# Patient Record
Sex: Male | Born: 1935
Health system: Southern US, Community
[De-identification: ages and names within clinical notes are randomized; demographics above are authoritative.]

## PROBLEM LIST (undated history)

## (undated) DIAGNOSIS — I350 Nonrheumatic aortic (valve) stenosis: Secondary | ICD-10-CM

## (undated) DIAGNOSIS — J189 Pneumonia, unspecified organism: Secondary | ICD-10-CM

## (undated) DIAGNOSIS — I482 Chronic atrial fibrillation, unspecified: Secondary | ICD-10-CM

## (undated) DIAGNOSIS — Z8619 Personal history of other infectious and parasitic diseases: Secondary | ICD-10-CM

## (undated) DIAGNOSIS — I252 Old myocardial infarction: Secondary | ICD-10-CM

## (undated) DIAGNOSIS — R011 Cardiac murmur, unspecified: Secondary | ICD-10-CM

## (undated) DIAGNOSIS — K759 Inflammatory liver disease, unspecified: Secondary | ICD-10-CM

## (undated) DIAGNOSIS — I050 Rheumatic mitral stenosis: Secondary | ICD-10-CM

## (undated) DIAGNOSIS — Z7901 Long term (current) use of anticoagulants: Secondary | ICD-10-CM

## (undated) DIAGNOSIS — M1990 Unspecified osteoarthritis, unspecified site: Secondary | ICD-10-CM

## (undated) DIAGNOSIS — I340 Nonrheumatic mitral (valve) insufficiency: Secondary | ICD-10-CM

## (undated) DIAGNOSIS — I2510 Atherosclerotic heart disease of native coronary artery without angina pectoris: Secondary | ICD-10-CM

## (undated) DIAGNOSIS — N400 Enlarged prostate without lower urinary tract symptoms: Secondary | ICD-10-CM

## (undated) DIAGNOSIS — F419 Anxiety disorder, unspecified: Secondary | ICD-10-CM

## (undated) DIAGNOSIS — I639 Cerebral infarction, unspecified: Secondary | ICD-10-CM

## (undated) DIAGNOSIS — I10 Essential (primary) hypertension: Secondary | ICD-10-CM

## (undated) HISTORY — DX: Nonrheumatic mitral (valve) insufficiency: I34.0

## (undated) HISTORY — DX: Chronic atrial fibrillation, unspecified: I48.20

## (undated) HISTORY — DX: Benign prostatic hyperplasia without lower urinary tract symptoms: N40.0

## (undated) HISTORY — DX: Cerebral infarction, unspecified: I63.9

## (undated) HISTORY — DX: Long term (current) use of anticoagulants: Z79.01

## (undated) HISTORY — DX: Essential (primary) hypertension: I10

## (undated) HISTORY — PX: PROSTATE BIOPSY: SHX241

## (undated) HISTORY — DX: Atherosclerotic heart disease of native coronary artery without angina pectoris: I25.10

## (undated) HISTORY — DX: Old myocardial infarction: I25.2

## (undated) HISTORY — PX: CATARACT EXTRACTION W/ INTRAOCULAR LENS IMPLANT: SHX1309

## (undated) HISTORY — DX: Personal history of other infectious and parasitic diseases: Z86.19

## (undated) HISTORY — DX: Unspecified osteoarthritis, unspecified site: M19.90

---

## 2000-10-30 ENCOUNTER — Encounter: Admission: RE | Admit: 2000-10-30 | Discharge: 2000-10-30 | Payer: Self-pay

## 2005-06-19 DIAGNOSIS — I252 Old myocardial infarction: Secondary | ICD-10-CM

## 2005-06-19 HISTORY — DX: Old myocardial infarction: I25.2

## 2005-06-19 HISTORY — PX: CORONARY ANGIOPLASTY: SHX604

## 2005-07-23 ENCOUNTER — Inpatient Hospital Stay (HOSPITAL_COMMUNITY): Admission: EM | Admit: 2005-07-23 | Discharge: 2005-07-31 | Payer: Self-pay | Admitting: Emergency Medicine

## 2005-07-25 ENCOUNTER — Encounter: Payer: Self-pay | Admitting: Cardiology

## 2005-08-17 ENCOUNTER — Encounter (HOSPITAL_COMMUNITY): Admission: RE | Admit: 2005-08-17 | Discharge: 2005-09-11 | Payer: Self-pay | Admitting: Cardiology

## 2005-09-03 ENCOUNTER — Inpatient Hospital Stay (HOSPITAL_COMMUNITY): Admission: EM | Admit: 2005-09-03 | Discharge: 2005-09-07 | Payer: Self-pay | Admitting: Emergency Medicine

## 2005-09-04 ENCOUNTER — Encounter: Payer: Self-pay | Admitting: Cardiology

## 2005-09-07 ENCOUNTER — Ambulatory Visit: Payer: Self-pay | Admitting: Physical Medicine & Rehabilitation

## 2005-09-07 ENCOUNTER — Inpatient Hospital Stay (HOSPITAL_COMMUNITY)
Admission: RE | Admit: 2005-09-07 | Discharge: 2005-09-22 | Payer: Self-pay | Admitting: Physical Medicine & Rehabilitation

## 2005-10-30 ENCOUNTER — Encounter
Admission: RE | Admit: 2005-10-30 | Discharge: 2006-01-28 | Payer: Self-pay | Admitting: Physical Medicine & Rehabilitation

## 2005-10-30 ENCOUNTER — Ambulatory Visit: Payer: Self-pay | Admitting: Physical Medicine & Rehabilitation

## 2005-11-02 ENCOUNTER — Ambulatory Visit (HOSPITAL_COMMUNITY)
Admission: RE | Admit: 2005-11-02 | Discharge: 2005-11-02 | Payer: Self-pay | Admitting: Physical Medicine & Rehabilitation

## 2005-11-08 ENCOUNTER — Encounter
Admission: RE | Admit: 2005-11-08 | Discharge: 2005-12-06 | Payer: Self-pay | Admitting: Physical Medicine & Rehabilitation

## 2005-12-07 ENCOUNTER — Encounter
Admission: RE | Admit: 2005-12-07 | Discharge: 2006-01-02 | Payer: Self-pay | Admitting: Physical Medicine & Rehabilitation

## 2005-12-26 ENCOUNTER — Ambulatory Visit: Payer: Self-pay | Admitting: Physical Medicine & Rehabilitation

## 2006-01-03 ENCOUNTER — Encounter
Admission: RE | Admit: 2006-01-03 | Discharge: 2006-02-02 | Payer: Self-pay | Admitting: Physical Medicine & Rehabilitation

## 2007-12-05 ENCOUNTER — Emergency Department (HOSPITAL_COMMUNITY): Admission: EM | Admit: 2007-12-05 | Discharge: 2007-12-06 | Payer: Self-pay | Admitting: Emergency Medicine

## 2008-03-09 ENCOUNTER — Encounter: Admission: RE | Admit: 2008-03-09 | Discharge: 2008-03-09 | Payer: Self-pay | Admitting: Gastroenterology

## 2010-02-17 ENCOUNTER — Ambulatory Visit: Payer: Self-pay | Admitting: Cardiology

## 2010-02-17 ENCOUNTER — Ambulatory Visit (HOSPITAL_COMMUNITY): Admission: RE | Admit: 2010-02-17 | Discharge: 2010-02-17 | Payer: Self-pay | Admitting: Cardiology

## 2010-03-21 ENCOUNTER — Ambulatory Visit: Payer: Self-pay | Admitting: Cardiology

## 2010-04-27 ENCOUNTER — Ambulatory Visit: Payer: Self-pay | Admitting: Cardiology

## 2010-05-25 ENCOUNTER — Ambulatory Visit: Payer: Self-pay | Admitting: Cardiology

## 2010-06-29 ENCOUNTER — Ambulatory Visit: Payer: Self-pay | Admitting: Cardiology

## 2010-08-02 ENCOUNTER — Ambulatory Visit (INDEPENDENT_AMBULATORY_CARE_PROVIDER_SITE_OTHER): Payer: Self-pay | Admitting: Cardiology

## 2010-08-02 DIAGNOSIS — Z79899 Other long term (current) drug therapy: Secondary | ICD-10-CM

## 2010-08-02 DIAGNOSIS — I4891 Unspecified atrial fibrillation: Secondary | ICD-10-CM

## 2010-08-02 DIAGNOSIS — I119 Hypertensive heart disease without heart failure: Secondary | ICD-10-CM

## 2010-09-01 ENCOUNTER — Encounter (INDEPENDENT_AMBULATORY_CARE_PROVIDER_SITE_OTHER): Payer: Medicare Other

## 2010-09-01 DIAGNOSIS — I4891 Unspecified atrial fibrillation: Secondary | ICD-10-CM

## 2010-09-01 DIAGNOSIS — Z7901 Long term (current) use of anticoagulants: Secondary | ICD-10-CM

## 2010-09-13 ENCOUNTER — Ambulatory Visit (INDEPENDENT_AMBULATORY_CARE_PROVIDER_SITE_OTHER): Payer: Medicare Other | Admitting: *Deleted

## 2010-09-13 DIAGNOSIS — I619 Nontraumatic intracerebral hemorrhage, unspecified: Secondary | ICD-10-CM | POA: Insufficient documentation

## 2010-09-13 DIAGNOSIS — Z7901 Long term (current) use of anticoagulants: Secondary | ICD-10-CM

## 2010-09-13 DIAGNOSIS — I4891 Unspecified atrial fibrillation: Secondary | ICD-10-CM

## 2010-09-13 DIAGNOSIS — I482 Chronic atrial fibrillation, unspecified: Secondary | ICD-10-CM | POA: Insufficient documentation

## 2010-09-26 ENCOUNTER — Encounter: Payer: Self-pay | Admitting: Nurse Practitioner

## 2010-09-29 ENCOUNTER — Other Ambulatory Visit: Payer: Self-pay | Admitting: *Deleted

## 2010-09-30 ENCOUNTER — Ambulatory Visit (INDEPENDENT_AMBULATORY_CARE_PROVIDER_SITE_OTHER): Payer: Medicare Other | Admitting: Nurse Practitioner

## 2010-09-30 ENCOUNTER — Ambulatory Visit (INDEPENDENT_AMBULATORY_CARE_PROVIDER_SITE_OTHER): Payer: Medicare Other | Admitting: *Deleted

## 2010-09-30 ENCOUNTER — Encounter: Payer: Self-pay | Admitting: Nurse Practitioner

## 2010-09-30 VITALS — BP 110/74 | HR 56 | Wt 202.2 lb

## 2010-09-30 DIAGNOSIS — I2510 Atherosclerotic heart disease of native coronary artery without angina pectoris: Secondary | ICD-10-CM

## 2010-09-30 DIAGNOSIS — I4891 Unspecified atrial fibrillation: Secondary | ICD-10-CM

## 2010-09-30 DIAGNOSIS — I10 Essential (primary) hypertension: Secondary | ICD-10-CM

## 2010-09-30 DIAGNOSIS — Z7901 Long term (current) use of anticoagulants: Secondary | ICD-10-CM

## 2010-09-30 LAB — POCT INR: INR: 2.5

## 2010-09-30 NOTE — Patient Instructions (Signed)
Stay on your current medicines. I will have you see Dr. Patty Sermons in 2 months. Recheck your coumadin level in 4 weeks.  Call for any problems.

## 2010-09-30 NOTE — Assessment & Plan Note (Signed)
Blood pressure looks great. We will continue with his current medicines.

## 2010-09-30 NOTE — Assessment & Plan Note (Signed)
He is using coumadin daily. His level is therapeutic. We will recheck an INR in 4 weeks. He takes his aspirin every other day and his Plavix every 4th day. He is not having any adverse bleeding or bruising. We will continue with this current regimen. He wishes to be seen every 2 months.

## 2010-09-30 NOTE — Assessment & Plan Note (Signed)
Currently asymptomatic 

## 2010-09-30 NOTE — Progress Notes (Signed)
Wayne [name] Date of Birth: 04/05/1930   History of Present Illness: Wayne [name] is seen today for his 2 month visit. He is seen for Dr. Patty Sermons. He continues to do well. He has had no chest pain. He is not really short of breath. He denies palpitations, lightheadedness or dizziness. He is tolerating his medicines. He is on aspirin, plavix and coumadin. He is not having any excessive bleeding or bruising. He feels good overall.   Current Outpatient Prescriptions on File Prior to Visit  Medication Sig Dispense Refill  . aspirin 81 MG tablet Take 81 mg by mouth. One tablet every other day       . clopidogrel (PLAVIX) 75 MG tablet Take 75 mg by mouth. Taking every 4th day       . digoxin (LANOXIN) 0.125 MG tablet Take 125 mcg by mouth daily.        . Glucosamine-Chondroitin (GLUCOSAMINE CHONDR COMPLEX PO) Take by mouth.        . metoprolol tartrate (LOPRESSOR) 25 MG tablet Take 25 mg by mouth. One tablet in am, 1/2 tablet in pm       . Multiple Vitamins-Minerals (MULTIVITAMIN WITH MINERALS) tablet Take 1 tablet by mouth daily.        . Saw Palmetto, Serenoa repens, (SAW PALMETTO BERRY PO) Take by mouth.        . warfarin (COUMADIN) 2.5 MG tablet Take 2.5 mg by mouth. As directed        Allergies  Allergen Reactions  . Novocain   . Penicillins Rash    Past Medical History  Diagnosis Date  . Atrial fibrillation     Chronic  . Chronic anticoagulation   . Stroke, embolic   . Old MI (myocardial infarction) 2007    s/p PCI to distal OM (no stent)  . HTN (hypertension)   . BPH (benign prostatic hyperplasia)     Past Surgical History  Procedure Date  . Coronary angioplasty 2007    Distal OM  . Prostate biopsy     History  Smoking status  . Never Smoker   Smokeless tobacco  . Never Used    History  Alcohol Use No    Family History  Problem Relation Age of Onset  . Heart disease Mother   . Heart disease Father     Review of Systems: The review of systems is as  above.  All other systems were reviewed and are negative.  Physical Exam: BP 110/74  Pulse 56  Wt 202 lb 3.2 [name] (91.717 kg) Patient is very pleasant and in no acute distress. Skin is warm and dry. Color is normal.  HEENT is unremarkable. Normocephalic/atraumatic. PERRL. Sclera are nonicteric. Neck is supple. No masses. No JVD. Lungs are clear. Cardiac exam shows an irregular rhythm. His rate is controlled. Abdomen is soft. Extremities are without edema. Gait and ROM are intact. No gross neurologic deficits noted.  LABORATORY DATA:  INR today is 2.5   Assessment / Plan:

## 2010-09-30 NOTE — Assessment & Plan Note (Signed)
His rate is controlled. We will continue with his current medicines.

## 2010-10-28 ENCOUNTER — Encounter: Payer: Medicare Other | Admitting: *Deleted

## 2010-10-31 ENCOUNTER — Ambulatory Visit (INDEPENDENT_AMBULATORY_CARE_PROVIDER_SITE_OTHER): Payer: Medicare Other | Admitting: *Deleted

## 2010-10-31 DIAGNOSIS — I4891 Unspecified atrial fibrillation: Secondary | ICD-10-CM

## 2010-10-31 LAB — POCT INR: INR: 2.5

## 2010-11-04 NOTE — Discharge Summary (Signed)
NAMEDEMITRIS, POKORNY NO.:  [id number]   MEDICAL RECORD NO.:  [id number]          PATIENT TYPE:  IPS   LOCATION:  4030                         FACILITY:  [facility]   PHYSICIAN:  Ranelle Oyster, M.D.DATE OF BIRTH:  08/28/1929   DATE OF ADMISSION:  09/07/2005  DATE OF DISCHARGE:  09/22/2005                                 DISCHARGE SUMMARY   DISCHARGE DIAGNOSES:  1.  Cerebellar cerebrovascular accident with left ataxia.  2.  Chronic atrial fibrillation with Coumadin therapy.  3.  Mild cardiac infarction with angioplasty in February 2007.  4.  Hypertension.  5.  Hyperlipidemia.   HISTORY OF PRESENT ILLNESS:  This is a 77 year old, right-handed, white male  with recent myocardial infarction on July 23, 2005, who had angioplasty  as well as chronic atrial fibrillation with chronic Coumadin therapy.  He  was admitted on March 18, with dizziness and left-sided weakness.  Cranial  CT scan was negative for acute changes.  MRI/MRA negative.  Echocardiogram  with ejection fraction of 55% with no emboli.  He was placed on intravenous  heparin until INR greater than 2.  Aspirin and Plavix therapy ongoing as  prior to hospital admission.  Neurology consult by Dr. Orlin Hilding.  Suspect  brain stem cerebellar infarct not seen on MRI.  Carotid duplex negative.  Transcranial Dopplers were pending.  Intravenous heparin discontinued on  March 21, and changed to Lovenox 90 mg every 12 hours until INR greater than  2.  He was admitted for comprehensive rehabilitation program.   PAST MEDICAL HISTORY:  See discharge diagnoses.  He lives with his wife.  Wife can assist on discharge.  He is a retired Medical illustrator for [employer].  He  has a three-level home with bedroom downstairs.  No steps to entry.   SOCIAL HISTORY:  No alcohol or tobacco.   ALLERGIES:  PENICILLIN and NOVOCAIN.   MEDICATIONS PRIOR TO ADMISSION:  1.  Aspirin 325 mg daily.  2.  Plavix 75 mg daily.  3.  Metoprolol 50  mg daily.  4.  Coumadin daily.  5.  Amiodarone 200 mg daily.  6.  Ambien at bedtime as needed.   REHABILITATION HOSPITAL COURSE:  The patient was admitted to inpatient  rehabilitation services with therapies initiated on a b.i.d. basis  consisting of physical therapy, occupational therapy, speech therapy and  rehabilitation nursing.  The following issues were addressed during the  patient's rehabilitation stay:  Pertaining to Mr. Deboer cerebellar  cerebrovascular accident remained medically stable.  He continued on aspirin  therapy as well as Plavix, chronic Coumadin for atrial fibrillation with  latest INR of 2.7 which will be followed by Dr. Patty Sermons.  Blood pressures  monitored with Lopressor and Lanoxin.  His amiodarone was adjusted to 200 mg  twice daily on September 21, 2005.  Functionally, he continued to progress full  family teaching ongoing. He was currently minimal assist for transfers,  wheelchair mobility, supervision upper body, bathing and dressing, minimal  assist lower body, toilet transfers minimal assist, toilet hygiene minimal  assist.  The patient with intermittent bouts of  fatigue which did improve  during his rehabilitation stay.  Home health therapies would be ongoing.   Latest labs showed an INR of 2.7.  TSH 2.45, hemoglobin 14.6, hematocrit  42.9, platelets 256,000.  Sodium 140, potassium 4.4, BUN 13, creatinine 1.2.   DISCHARGE MEDICATIONS:  1.  Coumadin with latest dose of 3 mg daily.  2.  Amiodarone 200 mg twice daily.  3.  Aspirin 81 mg daily.  4.  Plavix 75 mg daily.  5.  Potassium chloride 10 mEq three times daily.  6.  Lopressor 25 mg daily.  7.  Lanoxin 0.125 mg daily.  8.  Protonix 40 mg daily.  9.  Tylenol as needed.   ACTIVITY:  As tolerated.   DIET:  Regular.   SPECIAL INSTRUCTIONS:  Home health nurse to draw prothrombin time with  results to Dr. Patty Sermons, [phone].   FOLLOW UP:  Follow up with Dr. Ranelle Oyster at the  outpatient  rehabilitation service office as advised.  Follow up with Dr. Orlin Hilding with  neurology services.      Mariam Dollar, P.A.      Ranelle Oyster, M.D.  Electronically Signed    DA/MEDQ  D:  09/21/2005  T:  09/22/2005  Job:  [id number]   cc:   Cassell Clement, M.D.  Fax: [phone]   Gustavus Messing. Orlin Hilding, M.D.  Fax: [phone]

## 2010-11-04 NOTE — Discharge Summary (Signed)
Wayne [name], Wayne [name] NO.:  [id number]   MEDICAL RECORD NO.:  [id number]          PATIENT TYPE:  INP   LOCATION:  3734                         FACILITY:  [facility]   PHYSICIAN:  Cassell Clement, M.D. DATE OF BIRTH:  [birth year]   DATE OF ADMISSION:  09/03/2005  DATE OF DISCHARGE:  09/07/2005                                 DISCHARGE SUMMARY   FINAL DIAGNOSES:  1.  Intracranial cerebral infarction.  2.  Probable cerebral embolism with cerebral infarction.  3.  Chronic atrial fibrillation.  4.  Expressive aphasia.  5.  Coronary atherosclerosis with previous inferolateral myocardial      infarction status post percutaneous transluminal coronary angioplasty.  6.  Chronic Coumadin anticoagulation.  7.  Visual disturbances.   OPERATIONS PERFORMED:  None.   HISTORY:  This is 77 year old Caucasian gentleman with known coronary  atherosclerosis had a recent inferolateral myocardial infarction on July 23, 2005.  He was treated with emergent angioplasty to the distal obtuse  marginal #1. He had some postoperative intermittent atrial fibrillation and  was sent home on Coumadin and aspirin as well as Plavix.  He also contracted  during the hospitalization a GI virus which caused him to be quarantined in  the hospital four additional days.  There was also some concern for possible  pneumonia during that admission as well as some transient pericarditis  symptoms.  The patient has remained weak at home but had basically resumed  his walking schedule, and then a week ago developed an upper respiratory  infection and was treated with a Z-Pak.  Z-Pak was prescribed, but the  patient already had erythromycin tablets on hand which he took instead. He  was seen in the office on the Friday prior to admission, was given a  prescription for Tussionex and then subsequently developed dizziness and  slurred speech, and there was question of some weakness of the right leg.  He also  noted some blurred vision on the morning of admission. He was seen  in the emergency room and had already had CT scanning which showed no acute  abnormality, but his INR was noted to be subtherapeutic, and he was admitted  for probable CVA. At time of admission, he was on Coumadin 2.5 mg which had  recently been increased to seven days a week, and he was also on amiodarone  200 mg which had recently been decreased from a b.i.d. dosing schedule.  Other medications include Plavix, aspirin and Lopressor.   PHYSICAL EXAMINATION:  VITAL SIGNS:  On physical exam, blood pressure was  122/65, pulse 82, respirations 20.  He was afebrile.  HEART:  The heart rhythm was regular at time of admission. There was no  murmur.  ABDOMEN:  Negative.  EXTREMITIES:  No edema.  NEUROLOGICAL:  Neurologically, there appeared to be some receptive aphasia.  There were no gross deficits noted.   His INR was found to be subtherapeutic at 1.1 at the time of admission. CBC  and cardiac markers were negative, and chemistries were normal.  Chest x-ray  showed cardiomegaly, but no heart failure, and  there was bibasilar  atelectasis.  EKG showed atrial flutter with a regular response. Neurology  was asked to see the patient.  The patient was placed on IV heparin per  pharmacy protocol until his Coumadin level was therapeutic. He was seen by  Dr. Samara Deist morning who recommended a MRI of the brain with a MRA and that  he should also have 2-D echo and carotid studies and a swallowing  evaluation.  She noted that he was not a good candidate for thrombolytics  because too much time had passed since the onset of his symptoms. By the day  after admission, he had converted from atrial flutter back to sinus rhythm.  He complained of lethargy, and he did have slightly slurred speech. The MRI  was negative for a visible stroke.  Neurology reviewed his MRI and felt that  he had brainstem/cerebellar small stroke not seen on MRI and  probably was  cardioembolic from his atrial fibrillation with suboptimal anticoagulation.  They recommended physical therapy, occupational therapy and a rehab consult.  The patient had a 2-D echo which showed no embolic source. By March 20, the  patient had gone back into atrial fibrillation, and he himself was unaware  of his heart rhythm, and because he was not aware of heart rhythm, he will  certainly require long-term Coumadin anticoagulation.  His amiodarone was  increased to 200 mg b.i.d. in an effort to get him to convert back to sinus  rhythm.  By March 21, he was back in sinus rhythm on twice a day amiodarone,  and his INR was increasing gradually. Because of problems with vein access,  we switched him at that point from IV heparin to Lovenox and switch him to  saline lock. His expressive aphasia and dizziness improved slowly over the  next several days, and the plan was to transfer him to a rehab bed when bed  became available.  A bed did become available on September 07, 2005, and he was  transferred at that point to rehab. Of note is the fact that he did the  carotid duplex study in the vascular lab which showed no evidence of  significant internal carotid artery stenosis on either side.   CONDITION AT TIME OF TRANSFER:  Is stable.           ______________________________  Cassell Clement, M.D.     TB/MEDQ  D:  10/30/2005  T:  10/30/2005  Job:  [id number]   cc:   Santina Evans A. Orlin Hilding, M.D.  Fax: [phone]

## 2010-11-04 NOTE — Consult Note (Signed)
Wayne [name], Wayne [name] NO.:  [id number]   MEDICAL RECORD NO.:  [id number]          PATIENT TYPE:  INP   LOCATION:  3733                         FACILITY:  [facility]   PHYSICIAN:  Gustavus Messing. Orlin Hilding, M.D.DATE OF BIRTH:  [birth year]   DATE OF CONSULTATION:  09/03/2005  DATE OF DISCHARGE:                                   CONSULTATION   CHIEF COMPLAINT:  Dizziness, slurred speech, unsteadiness, nausea and  vomiting.   HISTORY OF PRESENT ILLNESS:  Wayne [name] is a 77 year old right-handed  white male with a history of recent MI in the first week of February of this  year.  He had angioplasty secondary to MI and unstable angina.  Since that  time, he has been in atrial fibrillation/flutter.  He was discharged on  aspirin, Plavix and Coumadin, although he is not therapeutic on his  Coumadin.  He was in his usual state of health when he went to bed last  night.  He woke up around 2:00 to go to the bathroom and was fine and went  back to bed.  When he woke up around 5:00 or 5:30 this morning, he was  staggering and dizzy with slurred speech.  Since arrival to the emergency  room,  he has also had some nausea and vomiting.   REVIEW OF SYSTEMS:  Negative for shortness of breath, chest pain, nausea or  vomiting.  No headache.   PAST MEDICAL HISTORY:  1.  Coronary artery disease, status post MI with angioplasty in February.  2.  Benign prostatic disease.  3.  Atrial fibrillation.  4.  Remote hemorrhoidectomy.  5.  No diabetes, hypertension, hyperlipidemia.   MEDICATIONS:  On discharge from the hospital six weeks ago:  1.  Aspirin 325 mg once a day.  2.  Plavix 75 mg once a day.  3.  Metoprolol 50 mg two a day.  4.  Coumadin varying doses once a day.  5.  Amiodarone 200 mg twice a day.  6.  Ambien 5 mg p.r.n. nightly.  7.  Nitroglycerin p.r.n.  sublingual.  8.  Imodium p.r.n.   ALLERGIES:  NOVOCAIN and PENICILLIN.   SOCIAL HISTORY:  He is married.  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   FAMILY HISTORY:  Noncontributory.   PHYSICAL EXAMINATION:  VITAL SIGNS:  Temperature 98.1, pulse 82, blood  pressure 119/53, respirations 18.  HEENT:  Head is normocephalic and atraumatic.  NEUROLOGIC:  He is awake and alert.  He follows commands, answers questions.  No points of level of consciousness.  He has full extraocular movements,  normal visual fields.  Normal facial motor activity.  Normal cranial nerves.  He has no drift in the bilateral upper extremities or left lower extremity.  He does have a very minimal drift in the right lower extremity, gets one  point for that.  There is no ataxia.  He reports a very slight decrease in  sensation on the right lower extremity, gets a one.  No aphasia.  Does have  mild dysarthria, gets a one.  No  neglect.  So his NIH stroke scale score is  3.  He does have mildly decreased deep tendon reflexes.   CT of the head is negative for anything acute.   INR is only 1.1 despite being on Coumadin.  PTT is 26.   IMPRESSION:  1.  Dysarthria, minimal drift and decreased sensation in the right lower      extremity on exam with dizziness, nausea and vomiting.  2.  Possible brain stem lesion. Labyrinthitis is also a possibility but      given  his subtherapeutic Coumadin, atrial fibrillation, atrial flutter      and recent myocardial infarction with instrumentation is most concerning      for stroke.   RECOMMENDATIONS:  Agree with heparin if his INR is only 1.1.  MRI of the  brain to see if we can determine whether he, in fact, had a stroke.  A 2-D  echo on carotids.      Catherine A. Orlin Hilding, M.D.  Electronically Signed     CAW/MEDQ  D:  09/03/2005  T:  09/04/2005  Job:  [id number]

## 2010-11-04 NOTE — H&P (Signed)
NAMELAKYN, MANTIONE NO.:  [id number]   MEDICAL RECORD NO.:  [id number]          PATIENT TYPE:  IPS   LOCATION:  4030                         FACILITY:  [facility]   PHYSICIAN:  Ellwood Dense, M.D.   DATE OF BIRTH:  11/05/1929   DATE OF ADMISSION:  09/07/2005  DATE OF DISCHARGE:                                HISTORY & PHYSICAL   PRIMARY CARE PHYSICIAN:  Cassell Clement, M.D.   NEUROLOGIST:  Gustavus Messing. Orlin Hilding, M.D.   ADMITTING PHYSICIAN:  Ranelle Oyster, M.D.   HISTORY OF THE PRESENT ILLNESS:  Mr. Renbarger is a 77 year old right-  handed adult male with a recent history of a myocardial infarction on  July 23, 2005.  He underwent angioplasty at that time.  He also has a  history of chronic atrial fibrillation and is on chronic Coumadin.  The  patient was admitted on September 03, 2005 with complaints of dizziness along  with left-sided weakness and numbness of his left arm.  INR on admission was  1.2.  Cranial CT was negative for acute changes.  MRI and MRA study were  negative.  Echocardiogram showed an ejection fraction of 55% with no source  of emboli.   The patient was placed on IV heparin until his INR was greater than 2.0.  Aspirin and Plavix were ongoing secondary to a recent myocardial infarction.  Neurology was consulted, and Dr. Orlin Hilding diagnosed a brainstem/cerebellar  infarction not seen on MRI scan.  Carotid duplex study was negative for  internal carotid artery stenosis.  His IV heparin drip was discontinued on  September 06, 2005, and he was changed to subcutaneous Lovenox 90 mg q.12h.  Transcranial Doppler studies are pending at the time of this dictation.   The patient was evaluated by the rehabilitation physicians and felt to be an  appropriate candidate for inpatient rehabilitation.   REVIEW OF SYSTEMS:  Positive for palpitations, reflux, lumbago, hearing loss  of the left ear and numbness of the left upper extremity.   PAST MEDICAL  HISTORY:  1.  History of myocardial infarction on July 23, 2005 with subsequent      angioplasty.  2.  Chronic atrial fibrillation.  3.  Hypertension.  4.  Hyperlipidemia.  5.  Prior hemorrhoid surgery.   FAMILY HISTORY:  Positive for diabetes mellitus and coronary artery disease.   SOCIAL HISTORY:  The patient lives with his wife, and his wife can assist.  He is a retired Medical illustrator for [employer].  They live in a 3-level home with  a bedroom downstairs and no steps to enter.  The patient does not use  alcohol or tobacco.   FUNCTION HISTORY PRIOR TO ADMISSION:  Independent.   ALLERGIES:  1.  PENICILLIN.  2.  NOVOCAIN.   MEDICATIONS PRIOR TO ADMISSION:  1.  Aspirin 325 mg daily.  2.  Plavix 75 mg daily.  3.  Metoprolol 50 mg daily.  4.  Coumadin daily.  5.  Amiodarone 200 mg daily.  6.  Ambien q.h.s. p.r.n.   RECENT LABORATORIES:  Hemoglobin of 12.7, hematocrit of 37.3, platelet  count  of 188,000 and white count of 8.3.  Recent INR was 1.7.  Homocystine was 13  and hemoglobin A1C was 5.7.  Recent sodium was 140, potassium 3.8, chloride  107, CO2 of 28, BUN 6 and creatinine 1.0.   PHYSICAL EXAMINATION:  GENERAL:  A well-appearing, fit adult male lying in  bed with complaints of dizziness, but no significant pain.  VITAL SIGNS:  Blood pressure was 126/69 with a pulse of 68, respiratory rate  18, and temperature 98.6.  HEENT:  Normocephalic and nontraumatic.  CARDIOVASCULAR:  Slightly irregular rate and rhythm without rubs or murmurs.  ABDOMEN:  Soft and nontender with positive bowel sounds.  LUNGS:  Clear to auscultation bilaterally.  NEUROLOGIC:  Alert and oriented x2-3.  Cranial nerves exam showed decreased  hearing in the left ear.  The patient had intact facial movements  bilaterally, and tongue was in the midline.  UPPER EXTREMITIES:  Bilateral upper exam showed 5-/5 strength on the right  and 4+/5 strength on the left upper extremity.  Sensation was decreased to   light touch in the left upper extremity compared to the right, and he  complained of numbness in the left upper extremity.  Reflexes were 1 to 2+  in the bilateral upper extremities.  LOWER EXTREMITIES:  4+/5 strength bilaterally.  Sensation was intact to  light touch in the bilateral lower extremities.  Reflexes were 1+ in the  bilateral knees and ankles.  NEUROLOGIC:  Coordination testing showed poor finger-to-nose movement on the  right, and good finger-to-nose movement on the left.   IMPRESSION:  Status post cerebellar infarction with left-sided ataxia,  numbness and decreased coordinated movements.  Presently, the patient has  deficits in ADL's, transfers and ambulation secondary to a left cerebellar  infarction, likely embolic.   PLAN:  1.  Admit to the rehabilitation unit for daily OT and PT therapies to      advance ADL's, transfers and ambulation.  2.  Twenty-four hour nursing for medication administration, monitoring of      vitals and wound dressing changes as needed.  3.  Check admission labs including CBC and CMET August 11, 2005.  4.  Continue Coumadin per pharmacy along with subcutaneous Lovenox 90 mg      q.12h. until INR is consistently greater than 2.0.  5.  Monitor hypertension on Lopressor 50 mg b.i.d. along with amiodarone 200      mg b.i.d.  6.  Continue aspirin 81 mg daily along with Plavix 75 mg daily.  7.  Continue potassium chloride 10 mEq p.o. t.i.d.   PROGNOSIS:  Good.   ESTIMATED LENGTH OF STAY:  5-12 days.   GOALS:  Modified independent for ADL's, transfers and ambulation.           ______________________________  Ellwood Dense, M.D.     DC/MEDQ  D:  09/07/2005  T:  09/08/2005  Job:  [id number]

## 2010-11-04 NOTE — Assessment & Plan Note (Signed)
Wayne [name] is back regarding his right cerebellar and potentially right brainstem  stroke, which was identifiable on MRI.  Patient is left with right-sided  ataxia and decreased balance and dysphagia.  The patient has slowly  progressed from a functional standpoint.  There were some concerns a week or  two ago about some increased dysphagia.  Speech therapy is now seeing him at  the house to recommend a swallowing study this week.  I spoke with Mr.  [name] about swallowing, and he notes some intolerance of thin liquids,  particularly water.  The patient otherwise is pleased with some of his  progress, particularly his gait, which is stabilizing.  He is using his  right hand more.  He does have some concerns that his speech is more  dysarthric than what it was a week or two ago.  We did some other diagnostic  work when he had the problems with dysphagia, including an electrolyte panel  and urinalysis, and everything was clear.   The patient was quite fixated on the amount of medications he is taking.  He  states that he is not a medication person and stated he had to beg Dr.  Patty Sermons to take him off the amiodarone.  Currently, the patient is on  aspirin 81 mg a day, Plavix 75 mg a day, digoxin 0.125 mg daily, metoprolol  25 mg a b.i.d., and Coumadin 5/2.5 mg, depending on the day of the week.   The patient has essentially completed his home therapies.  He is looking for  more work to do.  He denies any pain today.  Sleep is fair.  Appetite is  improved.  He still has some loss of taste and smell.  Bowel and bladder  function has been stable.   REVIEW OF SYSTEMS:  Patient reports numbness, tremor, trouble walking, right-  sided spasms, dizziness, decreased appetite, easy bleeding, and some weight  loss over the last several weeks.   SOCIAL HISTORY:  Patient is married and lives with his spouse, who is  supportive.   PHYSICAL EXAMINATION:  VITAL SIGNS:  Blood pressure 122/54, pulse  67,  respiratory rate 16.  He is satting at 98% on room air.  GENERAL:  Patient is pleasant in no acute distress.  He is alert and  oriented x3.  Affect is generally bright and appropriate.  NEUROLOGIC:  Gait is slightly shuffling.  He has some difficulty clearing  the right leg, and his proprioception is somewhat decreased.  His fine motor  coordination is decreased, and he has notable ataxia of the right upper and  lower extremities, although this is greatly improved from the hospital stay.  Sensation is generally intact.  Reflexes are 2+ on either side.  Speech is  slightly dysarthric.  Cognitively, he is intact.  He has good gaze in the  visual fields today.  LUNGS:  Clear.  HEART:  Irregularly irregular.  ABDOMEN:  Soft and nontender.  No clubbing, cyanosis or edema was seen.   ASSESSMENT:  1.  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  2.  Homero Fellers atrial fibrillation.  3.  History of myocardial infarction with angioplasty in 2007.  4.  Hypertension.  5.  Hyperlipidemia.   PLAN:  1.  Encourage transition to outpatient PT/OT and speech, to improve his      balance and speech to follow up swallowing.  I think the modified barium      swallow this week  is worthwhile, considering his coughing with thin      liquids.  2.  I tried my best to encourage Wayne [name] that he is improving.  He seems to be      fixated on some of the negatives rather than the positives that have      happened over the last few weeks time.  He seems really perseverative      upon medications and determined to decrease medications as much as      possible.  He will need to discuss this with Dr. Patty Sermons regarding his      number of medications.  As I see it, these medications all look      appropriate and necessary.  I would question as to whether we could      discontinue the aspirin.  Ultimately, that is up to Dr. Patty Sermons.  3.  We have discussed his hearing dysfunction  on the left side  that seems      to be waxing and waning.  The patient would like to hold off before      pursuing audiology evaluation.  We will assess that at a later date.  4.  I will see the patient in about two months time.      Ranelle Oyster, M.D.  Electronically Signed     ZTS/MedQ  D:  10/31/2005 17:19:27  T:  11/01/2005 10:40:39  Job #:  [id number]   cc:   Cassell Clement, M.D.  Fax: [phone]

## 2010-11-04 NOTE — Cardiovascular Report (Signed)
NAMEDONELL, TOMKINS NO.:  [id number]   MEDICAL RECORD NO.:  [id number]          PATIENT TYPE:  INP   LOCATION:  2914                         FACILITY:  [facility]   PHYSICIAN:  Meade Maw, M.D.    DATE OF BIRTH:  10-Nov-1929   DATE OF PROCEDURE:  07/23/2005  DATE OF DISCHARGE:                              CARDIAC CATHETERIZATION   REFERRING PHYSICIAN:  Cassell Clement, M.D.   INDICATION FOR PROCEDURE:  Acute inferolateral myocardial infarction.   PROCEDURE:  After obtaining written informed consent, the patient was  brought to the cardiac catheterization lab in the postabsorptive state.  Preop sedation was achieved using Versed 1 mg IV.  The right groin was  prepped and draped in usual sterile fashion.  Local anesthesia was achieved  using 1% Xylocaine.  A 6 French hemostasis sheath was placed into the right  femoral artery using a modified Seldinger's technique.  Selective coronary  angiography was performed using a 6 [place] JL4, JR4 and Judkins catheter.  Multiple views were obtained.  All catheter exchanges were made over a guide  wire.  There was no immediate complications.  Dr. Eldridge Dace was consulted to  proceed with intervention on the circumflex vessels.   FINDINGS:  The aortic pressure is 115/64, LV pressure is 123/9, EDP of 15.  Single plane ventriculogram in the RAO position revealed inferolateral  hypokinesis, ejection fraction of 50-55%.  Coronary angiography and left  main coronary artery bifurcates into the left anterior descending circumflex  vessel.  There is no disease noted in the left main coronary artery.   Left anterior descending.  The left anterior descending gives rise to a  large D1 and large D2.  Goes on to end as an apical branch.  There is no  disease noted in the left anterior descending or its branches.   Circumflex vessel.  Circumflex vessel is a large caliber vessel.  It was  initially noted to be 100% occluded in the mid  portion.  Following selective  coronary angiography, the vessel opened to the more distal portion.  There  was 100% occlusion in the mid to distal portion at the end of the procedure.   Right coronary artery.  Right coronary artery is a large dominant artery.  There was no disease noted in the right coronary artery.   FINAL IMPRESSION:  Total occlusion at the mid distal circumflex associated  with wall motion abnormalities and findings were reviewed with [place].  He  will proceed with intervention on the right coronary artery.      Meade Maw, M.D.  Electronically Signed     HP/MEDQ  D:  07/23/2005  T:  07/23/2005  Job:  [id number]

## 2010-11-04 NOTE — Assessment & Plan Note (Signed)
Wayne [name] is back in followup regarding his right cerebellar and questionable  right brain stem stroke.  He continues to suffer some right-sided ataxia,  dysarthria and sensory loss.  He is improving, however, with his balance and  is walking with his three-wheeled walker.  He walks short distances at home  without an adaptive device.  He denies any falls.  His mood is improved.  He  still complains of being tired and usually on the day subsequent to a day of  heavy activity, he feels worn-out and shaky.  He denies any frank pain  today.  His sleep has been fair to good.  His moods have been much improved.  He still reports tremor of the right hand, but does do better when he  concentrates.  He still is in physical therapy for another week or so.  Bowels have been positive for constipation.  No other medications have been  changed since last visit.  He is under Dr. Yevonne Pax care for his  cardiologic needs.   REVIEW OF SYSTEMS:  Pertinent positives are listed above.  Full 14-point  review is in the health and history section.   SOCIAL HISTORY:  The patient lives with his wife and she is very supportive.   PHYSICAL EXAMINATION:  VITAL SIGNS:  Blood pressure is 132/71, pulse is 54,  respiratory rate 16, he is satting 98% on room air.  GENERAL:  The patient is pleasant, no acute distress.  He is alert and  oriented times three.  Affect is bright.  He is a little less anxious than  he was on his prior visit in May.  Coordination remains impaired in the  right hand with a mild ataxia and dysmetria noted.  He was, however,  improved with his coordination, overall.  Reflexes were 2+ on the right and  left today.  Sensation was 1/2 in the right hand and leg today.  Strength in  the right arm was 4+/5 proximally, 4/5 distally.  Right leg was 4+ to 5/5.  The patient had slight dysarthria, but was clearly intelligible.  He had no  word-finding deficits.  He had a mild right central 7 today.  HEART:   Regular rate today.  CHEST:  Clear.  ABDOMEN:  Soft, nontender.  GAIT:  The patient walked for me and had slight decrease in his stride  length on the right leg with some insecurity noted there.  Otherwise, he was  much improved.  He walks without his walker for me today.   ASSESSMENT:  1.  Right brain stem stroke/cerebellar stroke with ataxia, hemi-sensory loss      and right-sided weakness.  2.  Chronic atrial fibrillation.  3.  History of MI with angioplasty.  4.  Hypertension.  5.  Hyperlipidemia.   PLAN:  1.  Transition from outpatient physical therapy to home exercise/gym      program.  I think he would do well in a [facility] setting with a group with      pool therapy to work on balance and coordination, as well as his      endurance.  I explained to him at length that it is normal to have      fatigue during a given day in the subsequent days after a stroke.  He      has made remarkable progress up to this point and I would expect there      to be further progress over the next 6-12 months.  I  expect his most      significant deficits to be related to sensory loss in the right side.      His ataxia should improve a great bit more over the next few months.  I      encouraged continued exercise and repetition.  He is to work on diet,      appropriate sleep.  Otherwise, I had nothing new to offer him today.  He      will      continue following up with Dr. Patty Sermons for his medical needs.  2.  I will see Wayne [name] back in 6 months' time.      Ranelle Oyster, M.D.  Electronically Signed     ZTS/MedQ  D:  12/27/2005 12:35:42  T:  12/27/2005 15:16:56  Job #:  [id number]   cc:   Cassell Clement, M.D.  Fax: [phone]

## 2010-11-04 NOTE — Cardiovascular Report (Signed)
NAMEALANDO, COLLERAN NO.:  [id number]   MEDICAL RECORD NO.:  [id number]          PATIENT TYPE:  INP   LOCATION:  2914                         FACILITY:  [facility]   PHYSICIAN:  Corky Crafts, MDDATE OF BIRTH:  03-Oct-1929   DATE OF PROCEDURE:  07/23/2005  DATE OF DISCHARGE:                              CARDIAC CATHETERIZATION   PRIMARY CARE PHYSICIAN:  Cassell Clement, M.D.   PROCEDURES PERFORMED:  PTCA to distal obtuse marginal-1.   INDICATIONS:  Unstable angina.   OPERATOR:  Corky Crafts, MD.   Hilarie Fredrickson NARRATIVE:  The diagnostic catheterization was performed by Dr.  Fraser Din.  A distal OM1 occlusion was noted. Of note, the flow in the obtuse  marginal one improved significantly during the diagnostic catheterization,  the later pictures revealed a distal thrombotic area.  A 3.5 CLF guiding  catheter was used to engage the left main coronary artery; an Clinical cytogeneticist  wire was used to cross the area of stenosis in the distal OM1.  Then 200 mcg  of intracoronary nitroglycerin was given through the guiding catheter. A 2.0  x 12 mm Voyager balloon was then advanced to the lesion. There was a  significant amount of thrombus noted. The balloon was inflated to 8 atm for  16 sec.  The initial lesion was significantly improved. There continued to  be significant distal vessel thrombus. I doddered that area of thrombus with  the balloon catheter.  The thrombus remained.  A more distal balloon  inflation was then performed with the 2.0 x 12 mm Voyager for 20 seconds.  The distal area of occlusion was still noted.  The initial area of stenosis;  however, was improved.  Then 30 mcg of intracoronary adenosine was infused,  with no change in the appearance of the vessel.  The occluded distal vessel  was small; it was approximately 1.5 mm in diameter. The patient was also  hemodynamically stable and pain-free.  I felt that the risks of further wire  manipulation  and balloon angioplasty in this small territory outweighed the  potential benefits, given how stable the patient was hemodynamically.  Also,  since the patient had preserved left ventricular systolic function by the  ventriculogram, there did not appear to be a large amount of myocardium at  risk. While on the table, the patient told me of his difficulty in taking  aspirin. He states he has significant bruising, which has prevented him from  taking a daily aspirin as well as warfarin for his atrial fibrillation.  Given these issues, I did not want to commit him to Plavix by putting a  stent in the distal vessel. There was an excellent balloon result, with a 0%  residual stenosis. There was normal flow through the initial area of  stenosis, but a cutoff of flow noted in the small distal vessel.   IMPRESSION:  Successful PTCA of the distal OM.  There was restoration of  flow through the initial lesion, but significant distal embolization noted  occluding a small distal vessel.  The patient was likely embolizing  throughout his clinical  course. This likely explains why he had intermittent  chest pain.  The patient's flow in the OM-1 improved significantly during  the diagnostic catheterization, which also likely was a result of distal  embolization.   RECOMMENDATIONS:  1.  Continue Integrilin IV for 18 hours. Hopefully this will help improve      flow in the distally occluded small      vessel.  We will also start aspirin 325 q.d. and Plavix as well.  2.  He will also be treated for his atrial fibrillation with rate control.  3.  The patient will be monitored in the CCU.      Corky Crafts, MD  Electronically Signed     JSV/MEDQ  D:  07/23/2005  T:  07/23/2005  Job:  [id number]

## 2010-11-04 NOTE — H&P (Signed)
Wayne [name], Wayne [name] NO.:  [id number]   MEDICAL RECORD NO.:  [id number]          PATIENT TYPE:  INP   LOCATION:  3733                         FACILITY:  [facility]   PHYSICIAN:  Cassell Clement, M.D. DATE OF BIRTH:  04-Jul-1929   DATE OF ADMISSION:  09/03/2005  DATE OF DISCHARGE:                                HISTORY & PHYSICAL   CHIEF COMPLAINT:  Dizziness.   HISTORY OF PRESENT ILLNESS:  Patient is a 77 year old white male.  He has  had a recent inferolateral myocardial infarction sustained on July 23, 2005.  He was treated with emergent angioplasty to the distal OM-1.  Apparently, he was noted to have atrial fibrillation and was sent home on  Coumadin, aspirin as well as Plavix.  His hospitalization was complicated by  the GI virus that caused quarantine here in the hospital.  There was concern  for possible pneumonia during that admission as well and even pericarditis.  The patient has done only marginally well over the course of the past month,  but had basically resumed his walking schedule.  Apparently he had the onset  of the URI approximately one week ago.  Z pack was prescribed, however, the  patient had erythromycin tablets on hand which he started taking.  He was  seen in the office on Friday, and was given a prescription for Tussionex.  Apparently the patient was very sensitive to medicines.  He had now the  onset of dizziness.  His daughter has noted episode of slurred speech.  There has been some question of weakness of the right leg but it sounds more  in the way that the right leg has been jumpy.  He has had some blurred  vision earlier this morning.  He was seen in the emergency room and has  already had CT scanning which showed no acute abnormality.  His INR is noted  to be subtherapeutic and he is now admitted for probable CVA.   ALLERGIES:  PENICILLIN and NOVOCAIN.   CURRENT MEDICATIONS:  1.  Aspirin which was decreased on Friday to 81  mg.  2.  Plavix 75 mg a day.  3.  Coumadin 2.5 which was increased to seven days a week.  4.  Amiodarone 200 mg which was recently decreased from a b.i.d. dosing      schedule.  5.  Lopressor 50 b.i.d.   PAST MEDICAL HISTORY:  According to the patient:  1.  Known coronary disease with recent inferolateral MI treated with      angioplasty to the distal OM-1.  Catheterization findings at that time      showed ejection fraction 50 to 55%.  The LAD showed no significant      disease.  His circumflex was 100% occlusion in the mid section and was      subsequently the site for intervention.  The right coronary artery had      no significant disease as well.  2.  Previous GI virus February of 2007.  3.  Reported paroxysmal atrial fibrillation/flutter currently on Coumadin.  4.  Remote history  of hepatitis.  5.  Status post hemorrhoidectomy.   The patient denies hypertension, previous stroke, diabetes or problems with  cholesterol levels.   FAMILY HISTORY:  Father died at 70, mother died at 3.  Three brothers have  all died of heart disease.   SOCIAL HISTORY:  He is married.  He walks regularly.   REVIEW OF SYSTEMS:  He has had bronchitis for the past week.  He did take  erythromycin tablets.  He has had Tussionex as well for the cough.  He has  had no frank chest pain.  [place]   PHYSICAL EXAMINATION:  VITAL SIGNS:  Blood pressure 122/65, heart rate 82,  respiratory rate 20, he is afebrile.  SKIN:  Warm and dry.  Color is pale.  LUNGS:  Clear.  CARDIOVASCULAR:  Regular rhythm.  There is no murmur.  ABDOMEN:  Soft with positive bowel sounds.  EXTREMITIES:  No evidence of edema.  NEUROLOGIC:  Initially, there was concern for possible receptive aphasia.  His smile was not symmetric.  This subsequently resolved.  There were no  gross deficits noted.   PERTINENT LABORATORY DATA:  CBC is normal.  INR is 1.1.  Chemistries are  normal.   Cardiac markers negative.   Chest x-ray shows cardiomegaly, no heart failure with bibasilar atelectasis.   EKG shows atrial flutter.   Reported verbal report of the CT scan shows no acute abnormality.   OVERALL IMPRESSION:  1.  Dizziness with questionable change of neurologic status.  2.  Recent myocardial infarction with percutaneous coronary intervention.  3.  Atrial fibrillation/flutter.  His INR is currently subtherapeutic.  4.  Concurrent upper respiratory infection, previously on EES.   PLAN:  He will be admitted to the service of Dr. Ronny Flurry.  Will ask  neurology to see.  Labs will be checked and he will begin on IV heparin per  pharmacy protocol.      Sharlee Blew, N.P.    ______________________________  Cassell Clement, M.D.    LC/MEDQ  D:  09/03/2005  T:  09/04/2005  Job:  [id number]

## 2010-11-04 NOTE — Discharge Summary (Signed)
Wayne [name], Wayne [name] NO.:  [id number]   MEDICAL RECORD NO.:  [id number]          PATIENT TYPE:  INP   LOCATION:  3728                         FACILITY:  [facility]   PHYSICIAN:  Cassell Clement, M.D. DATE OF BIRTH:  1930/03/28   DATE OF ADMISSION:  07/23/2005  DATE OF DISCHARGE:  07/31/2005                                 DISCHARGE SUMMARY   FINAL DIAGNOSES:  1.  Acute inferior wall myocardial infarction.  2.  Atrial fibrillation.  3.  Pneumonia and bronchitis.  4.  Congestive heart failure.  5.  Viral diarrhea.  6.  Coronary atherosclerosis.   OPERATIONS PERFORMED:  Left heart cardiac catheterization by Dr. Meade Maw on July 23, 2005 and PTCA of distal obtuse marginal-1 by Dr.  Eldridge Dace on July 23, 2005.   HISTORY:  This 77 year old gentleman was admitted with severe chest pain on  July 23, 2005. He had the onset of pain at 7 p.m. while going out in the  cold. The pain was severe and the patient was moaning and groaning. He had  taken aspirin at home without relief. He presented to the emergency room  where heparin was started and he was seen by Dr. Fraser Din who was on-call for  Dr. Patty Sermons.   On admission, his blood pressure was 147/83, pulse of 68. He was afebrile.  O2 sat was 97%. The heart reveals a regular rhythm without murmur, gallop or  rub. Abdomen was benign. Extremities showed no phlebitis. Pedal pulses were  good.   His electrocardiogram showed normal sinus rhythm with 1 mm ST-segment  elevation in the inferior leads. Electrolytes were normal.   HOSPITAL COURSE:  The patient was taken emergently to the cath lab by Dr.  Fraser Din for the indication of an acute inferior wall MI. He was given  Versed. He was found to have inferolateral hypokinesis with ejection  fraction of 50-55% and a total occlusion with thrombosis of the mid-  circumflex. Dr. Eldridge Dace was called by Dr. Fraser Din to perform intervention.  This area of stenosis was  in the distal obtuse marginal #1. The patient  underwent successful angioplasty without stenting. It was noted that some  distal embolization occluded a small distal vessel and it was felt that the  patient was likely embolizing even during the diagnostic catheterization. He  was continued on Integrilin for 18 hours and aspirin and Plavix were  started. He was also started on IV heparin for his atrial fibrillation and  subsequently started on Coumadin. Metoprolol was used for control of his  ventricular rate.   By July 24, 2005, the patient was still having some vague chest pain  worse with cough and deep breathing, was running a low-grade, temperature of  99.5 and white count was 12,000. His chest x-ray on admission was suggestive  of some bilateral lower lobe airspace disease and we did treat him for  concomitant bronchitis or pneumonia with Mucinex and antibiotics in addition  to treatment of his atrial fibrillation with rate control and IV heparin. He  was noted to have bilateral rales worse on the left  and his B-type  natriuretic peptide was normal suggesting that the rales were pulmonary  rather than cardiac. A repeat chest x-ray on July 25, 2005 showed normal  heart size and bilateral lower lobe airspace disease with a atelectasis and  effusion at the left base. Renal function remained stable post cath. Liver  function studies which had been elevated improved. He did not convert on IV  Cardizem and therefore he was switched to IV amiodarone. Pulmonary toilet  was continued and he was given incentive spirometry. Coumadin was begun on  July 25, 2005. A echocardiogram done July 25, 2005 had shown no  pericardial effusion and showed good LV function.   By July 26, 2005, he was strong enough to be transferred to telemetry and  was begun on cardiac rehab. His heparin was switched to Lovenox to decrease  his need for IV's which were impeding his ability to ambulate. He  showed  improvement and was approaching time for discharge but then his wife at home  came down with the highly infectious Norovirus and the patient himself  subsequently became ill with any acute diarrheal illness on July 30, 2005 secondary to the Norovirus. His C-difficile was negative. He was  treated symptomatically with stopping his Lasix and encouraging oral fluids.  On July 30, 2005, he had 10-15 diarrheal stools but after midnight had  only two stools and by July 31, 2005 was strong enough to be discharged  home. His INR had increased significantly over the last two days of his  illness when he was eating very little and his pneumonia by the time of  discharge had resolved. At the time of discharge, his rhythm had gone back  into normal sinus rhythm and he was able to be discharged on the following  regimen.   DISCHARGE MEDICATIONS:  1.  Enteric-coated aspirin 325 milligrams daily.  2.  Plavix 75 milligrams daily.  3.  Metoprolol 50 milligrams twice a day.  4.  Coumadin 2.5 milligrams daily starting August 01, 2005.  5.  Amiodarone 200 milligrams twice a day.  6.  Ambien 5 milligrams h.s. p.r.n. sleep.  7.  Nitrostat 1/50 sublingually p.r.n.  8.  Imodium 2 milligrams p.r.n. loose stools.  9.  Statin therapy will be started on an outpatient basis after he has      recovered further from his recent intestinal diarrheal illness.  10. ACE inhibitor will be started on outpatient basis after blood pressure      stabilizes further.   The patient will be seen back in the office on August 03, 2005 for a pro  time and further follow-up.   Condition on discharge is improved.           ______________________________  Cassell Clement, M.D.     TB/MEDQ  D:  09/05/2005  T:  09/05/2005  Job:  [id number]   cc:   Corky Crafts, MD  Fax: [phone]

## 2010-11-11 ENCOUNTER — Other Ambulatory Visit: Payer: Self-pay | Admitting: Cardiology

## 2010-11-11 NOTE — Telephone Encounter (Signed)
Fax received from pharmacy. Refill completed. Jodette Jetaun Colbath RN  

## 2010-11-25 ENCOUNTER — Encounter: Payer: Self-pay | Admitting: Cardiology

## 2010-11-25 ENCOUNTER — Other Ambulatory Visit: Payer: Self-pay | Admitting: Cardiology

## 2010-11-25 NOTE — Telephone Encounter (Signed)
Fax received from pharmacy. Refill completed. Jodette Reco Shonk RN  

## 2010-11-29 ENCOUNTER — Ambulatory Visit (INDEPENDENT_AMBULATORY_CARE_PROVIDER_SITE_OTHER): Payer: Medicare Other | Admitting: *Deleted

## 2010-11-29 ENCOUNTER — Ambulatory Visit (INDEPENDENT_AMBULATORY_CARE_PROVIDER_SITE_OTHER): Payer: Medicare Other | Admitting: Cardiology

## 2010-11-29 ENCOUNTER — Encounter: Payer: Self-pay | Admitting: Cardiology

## 2010-11-29 DIAGNOSIS — I4891 Unspecified atrial fibrillation: Secondary | ICD-10-CM

## 2010-11-29 DIAGNOSIS — I2510 Atherosclerotic heart disease of native coronary artery without angina pectoris: Secondary | ICD-10-CM

## 2010-11-29 LAB — POCT INR: INR: 2.7

## 2010-11-29 NOTE — Assessment & Plan Note (Signed)
The patient has a past history of ischemic heart disease.  He had a subendocardial myocardial infarction in 2007 he has not had any recurrent angina pectoris.  He's not having any symptoms of congestive heart failure.

## 2010-11-29 NOTE — Progress Notes (Signed)
Wayne [name] Date of Birth:  Nov 06, 1929 [facility] / [facility] [address]   [address] [place], [place]  [address] [phone]           Fax   [phone]  HPI: This pleasant 77 year old gentleman is seen for a followup office visit.  He has a history of a subendocardial myocardial infarction with PCI to the distal obtuse marginal in 2007 he has a history of chronic atrial fibrillation.  He had a previous embolic stroke secondary to his atrial fibrillation.  He has not had any recurrent TIA symptoms.  He's not having symptoms of angina pectoris.He is on warfarin as directed and he alternates taking her Plavix one day with an baby aspirin next day.  His last echocardiogram was on 02/17/10 and showed a normal ejection fraction of 55-60% and mild aortic stenosis and mild mitral sclerosis without stenosis and normal pulmonary artery pressure.  He had a treadmill Cardiolite stress test in March of 2003, four years before his myocardial infarction and at that time there was no evidence of ischemia.  Current Outpatient Prescriptions  Medication Sig Dispense Refill  . aspirin 81 MG tablet Take 81 mg by mouth. One tablet every other day       . digoxin (LANOXIN) 0.125 MG tablet Take 125 mcg by mouth daily.        . Glucosamine-Chondroitin (GLUCOSAMINE CHONDR COMPLEX PO) Take by mouth.        . metoprolol tartrate (LOPRESSOR) 25 MG tablet Take 25 mg by mouth. One tablet in am, 1/2 tablet in pm       . Multiple Vitamins-Minerals (MULTIVITAMIN WITH MINERALS) tablet Take 1 tablet by mouth daily.        Marland Kitchen PLAVIX 75 MG tablet TAKE 1 TABLET EVERY DAY  90 tablet  3  . Saw Palmetto, Serenoa repens, (SAW PALMETTO BERRY PO) Take by mouth.        . warfarin (COUMADIN) 2.5 MG tablet TAKE 1 TABLET BY MOUTH TWICE A WEEK, AND 2 TABLET BY MOUTH 5 DAYS A WEEK OR AS DIRECTED  48 tablet  5    Allergies  Allergen Reactions  . Novocain   . Penicillins Rash    Patient Active Problem List    Diagnoses  . Atrial fibrillation  . Unspecified cerebral artery occlusion with cerebral infarction  . Chronic anticoagulation  . HTN (hypertension)  . CAD (coronary artery disease)    History  Smoking status  . Never Smoker   Smokeless tobacco  . Never Used    History  Alcohol Use No    Family History  Problem Relation Age of Onset  . Heart disease Mother   . Heart disease Father     Review of Systems: The patient denies any heat or cold intolerance.  No weight gain or weight loss.  The patient denies headaches or blurry vision.  There is no cough or sputum production.  The patient denies dizziness.  There is no hematuria or hematochezia.  The patient denies any muscle aches or arthritis.  The patient denies any rash.  The patient denies frequent falling or instability.  There is no history of depression or anxiety.  All other systems were reviewed and are negative.   Physical Exam: Filed Vitals:   11/29/10 0855  BP: 110/70  Pulse: 68  The general appearance reveals a well-developed well-nourished gentleman in no distress.The head and neck exam reveals pupils equal and reactive.  Extraocular movements are full.  There is no scleral icterus.  The mouth and pharynx are normal.  The neck is supple.  The carotids reveal no bruits.  The jugular venous pressure is normal.  The  thyroid is not enlarged.  There is no lymphadenopathy.  The chest is clear to percussion and auscultation.  There are no rales or rhonchi.  Expansion of the chest is symmetrical.  The precordium is quiet.  The first heart sound is normal.  The second heart sound is physiologically split.  There is no murmur gallop rub or click.  There is no abnormal lift or heave.  The abdomen is soft and nontender.  The bowel sounds are normal.  The liver and spleen are not enlarged.  There are no abdominal masses.  There are no abdominal bruits.  Extremities reveal good pedal pulses.  There is no phlebitis or edema.  There is  no cyanosis or clubbing.  Strength is normal and symmetrical in all extremities.  There is no lateralizing weakness.  There are no sensory deficits.  The skin is warm and dry.  There is no rash.    Assessment / Plan: Continue present medication.  Recheck for monthly protimes and we will see him in 3 months for followup office visit.

## 2010-11-29 NOTE — Assessment & Plan Note (Signed)
This pleasant 77 year old gentleman is seen for followup of his chronic atrial fibrillation.  He has a remote history of an embolic stroke.  He's been on long-term Coumadin.  He has not been aware of any recent TIA or stroke symptoms.  He denies chest pain or shortness of breath.  He remarked that he has been very satisfied with Coumadin and wonders why anybody would want to use Pradaxa.  He has not been experiencing any bleeding problems and his INRs have been stable

## 2010-12-27 ENCOUNTER — Ambulatory Visit (INDEPENDENT_AMBULATORY_CARE_PROVIDER_SITE_OTHER): Payer: Medicare Other | Admitting: *Deleted

## 2010-12-27 DIAGNOSIS — I4891 Unspecified atrial fibrillation: Secondary | ICD-10-CM

## 2010-12-27 LAB — POCT INR: INR: 2.5

## 2011-02-02 ENCOUNTER — Ambulatory Visit (INDEPENDENT_AMBULATORY_CARE_PROVIDER_SITE_OTHER): Payer: Medicare Other | Admitting: *Deleted

## 2011-02-02 ENCOUNTER — Ambulatory Visit (INDEPENDENT_AMBULATORY_CARE_PROVIDER_SITE_OTHER): Payer: Medicare Other | Admitting: Cardiology

## 2011-02-02 ENCOUNTER — Encounter: Payer: Self-pay | Admitting: Cardiology

## 2011-02-02 DIAGNOSIS — I2510 Atherosclerotic heart disease of native coronary artery without angina pectoris: Secondary | ICD-10-CM

## 2011-02-02 DIAGNOSIS — I6350 Cerebral infarction due to unspecified occlusion or stenosis of unspecified cerebral artery: Secondary | ICD-10-CM

## 2011-02-02 DIAGNOSIS — Z7901 Long term (current) use of anticoagulants: Secondary | ICD-10-CM

## 2011-02-02 DIAGNOSIS — I4891 Unspecified atrial fibrillation: Secondary | ICD-10-CM

## 2011-02-02 LAB — POCT INR: INR: 2.2

## 2011-02-02 NOTE — Progress Notes (Signed)
Wayne [name] Date of Birth:  03-24-1930 [facility] / [facility] [address]   [address] [place], [place]  [address] [phone]           Fax   [phone]  HPI: This pleasant 77 year old gentleman is seen for a followup office visit.  He has a history of a subendocardial myocardial infarction with PCI to the distal obtuse marginal in 2007 and he has a history of chronic atrial fibrillation.  He also has a history of a previous embolic stroke secondary to his atrial fibrillation.  He has not had any recurrent TIA symptoms.  He's not having any chest pain or angina pectoris.  He takes his aspirin every other day and he takes his Plavix every fourth day and he is on warfarin as directed.  His last echocardiogram was 02/17/10 and showed a normal ejection fraction of 55-60% with mild aortic stenosis and mild mitral sclerosis without stenosis.  He had a treadmill Cardiolite stress test in March of 2003, for years before his myocardial infarction.  Since last visit the patient has had no new cardiac symptoms.  He denies any chest pain or shortness of breath or TIA symptoms.  Current Outpatient Prescriptions  Medication Sig Dispense Refill  . aspirin 81 MG tablet Take 81 mg by mouth. One tablet every other day       . digoxin (LANOXIN) 0.125 MG tablet Take 125 mcg by mouth daily.        . Glucosamine-Chondroitin (GLUCOSAMINE CHONDR COMPLEX PO) Take by mouth.        . metoprolol tartrate (LOPRESSOR) 25 MG tablet Take 25 mg by mouth. One tablet in am, 1/2 tablet in pm       . Multiple Vitamins-Minerals (MULTIVITAMIN WITH MINERALS) tablet Take 1 tablet by mouth daily.        Marland Kitchen PLAVIX 75 MG tablet TAKE 1 TABLET EVERY DAY  90 tablet  3  . Saw Palmetto, Serenoa repens, (SAW PALMETTO BERRY PO) Take by mouth.        . triamcinolone (KENALOG) 0.1 % cream Apply topically 2 (two) times daily. As directed by P.A. Tollie Eth with Dr. Terri Piedra       . warfarin (COUMADIN) 2.5 MG tablet  TAKE 1 TABLET BY MOUTH TWICE A WEEK, AND 2 TABLET BY MOUTH 5 DAYS A WEEK OR AS DIRECTED  48 tablet  5    Allergies  Allergen Reactions  . Novocain   . Penicillins Rash    Patient Active Problem List  Diagnoses  . Atrial fibrillation  . Unspecified cerebral artery occlusion with cerebral infarction  . Chronic anticoagulation  . HTN (hypertension)  . CAD (coronary artery disease)    History  Smoking status  . Never Smoker   Smokeless tobacco  . Never Used    History  Alcohol Use No    Family History  Problem Relation Age of Onset  . Heart disease Mother   . Heart disease Father     Review of Systems: The patient denies any heat or cold intolerance.  No weight gain or weight loss.  The patient denies headaches or blurry vision.  There is no cough or sputum production.  The patient denies dizziness.  There is no hematuria or hematochezia.  The patient denies any muscle aches or arthritis.  The patient denies any rash.  The patient denies frequent falling or instability.  There is no history of depression or anxiety.  All other systems were reviewed  and are negative.   Physical Exam: Filed Vitals:   02/02/11 1159  BP: 110/70  Pulse: 70  The general appearance feels a well-developed well-nourished gentleman in no distress.  Pupils equal and reactive.   Extraocular Movements are full.  There is no scleral icterus.  The mouth and pharynx are normal.  The neck is supple.  The carotids reveal no bruits.  The jugular venous pressure is normal.  The thyroid is not enlarged.  There is no lymphadenopathy.  The chest is clear to percussion and auscultation. There are no rales or rhonchi. Expansion of the chest is symmetrical.  The heart reveals a soft apical murmur of mitral regurgitation.  Rhythm is irregular. The abdomen is soft and nontender. Bowel sounds are normal. The liver and spleen are not enlarged. There Are no abdominal masses. There are no bruits.  The pedal pulses are  good.  There is no phlebitis or edema.  There is no cyanosis or clubbing.  Strength is normal and symmetrical in all extremities.  There is no lateralizing weakness.  There are no sensory deficits.  Integument shows easy bruising    Assessment / Plan:  Continue present medications.  Recheck at monthly intervals for prothrombin time and see for an office visit in 3 months

## 2011-02-02 NOTE — Assessment & Plan Note (Signed)
Patient has not had any TIA or stroke symptoms.  He has made a good recovery from his former stroke with just mild dysarthria persisting

## 2011-02-02 NOTE — Assessment & Plan Note (Signed)
The patient is on long-term Coumadin anticoagulation because of his atrial fibrillation.  His INR has been remaining within the normal range.  He will continue same medication

## 2011-02-02 NOTE — Assessment & Plan Note (Signed)
The patient denies any recurrent angina pectoris or chest discomfort

## 2011-02-13 ENCOUNTER — Telehealth: Payer: Self-pay | Admitting: Cardiology

## 2011-02-13 NOTE — Telephone Encounter (Signed)
Please call pt back regarding billing questions.

## 2011-03-06 ENCOUNTER — Ambulatory Visit (INDEPENDENT_AMBULATORY_CARE_PROVIDER_SITE_OTHER): Payer: Medicare Other | Admitting: *Deleted

## 2011-03-06 DIAGNOSIS — I4891 Unspecified atrial fibrillation: Secondary | ICD-10-CM

## 2011-03-08 ENCOUNTER — Ambulatory Visit: Payer: Medicare Other | Admitting: Cardiology

## 2011-03-16 LAB — PROTIME-INR
INR: 1.8 — ABNORMAL HIGH
Prothrombin Time: 21 — ABNORMAL HIGH

## 2011-04-03 ENCOUNTER — Ambulatory Visit (INDEPENDENT_AMBULATORY_CARE_PROVIDER_SITE_OTHER): Payer: Medicare Other | Admitting: *Deleted

## 2011-04-03 DIAGNOSIS — I4891 Unspecified atrial fibrillation: Secondary | ICD-10-CM

## 2011-05-08 ENCOUNTER — Ambulatory Visit (INDEPENDENT_AMBULATORY_CARE_PROVIDER_SITE_OTHER): Payer: Medicare Other | Admitting: *Deleted

## 2011-05-08 ENCOUNTER — Encounter: Payer: Self-pay | Admitting: Cardiology

## 2011-05-08 ENCOUNTER — Ambulatory Visit (INDEPENDENT_AMBULATORY_CARE_PROVIDER_SITE_OTHER): Payer: Medicare Other | Admitting: Cardiology

## 2011-05-08 VITALS — BP 124/80 | HR 66 | Ht 71.0 in | Wt 204.0 lb

## 2011-05-08 DIAGNOSIS — I2510 Atherosclerotic heart disease of native coronary artery without angina pectoris: Secondary | ICD-10-CM

## 2011-05-08 DIAGNOSIS — I6350 Cerebral infarction due to unspecified occlusion or stenosis of unspecified cerebral artery: Secondary | ICD-10-CM

## 2011-05-08 DIAGNOSIS — I4891 Unspecified atrial fibrillation: Secondary | ICD-10-CM

## 2011-05-08 DIAGNOSIS — Z7901 Long term (current) use of anticoagulants: Secondary | ICD-10-CM

## 2011-05-08 NOTE — Assessment & Plan Note (Signed)
The patient has not had any TIA symptoms following his remote stroke.  He takes is aspirin daily and takes Plavix every fourth day.  He is on Coumadin as directed from the Coumadin clinic

## 2011-05-08 NOTE — Assessment & Plan Note (Signed)
No chest pain with exertion.  The patient walks for exercise.  He also does some yard work.

## 2011-05-08 NOTE — Patient Instructions (Signed)
Your physician recommends that you continue on your current medications as directed. Please refer to the Current Medication list given to you today. Your physician recommends that you schedule a follow-up appointment in: 3 months with EKG   

## 2011-05-08 NOTE — Assessment & Plan Note (Signed)
The patient not been having any side effects from his anticoagulation.

## 2011-05-08 NOTE — Progress Notes (Signed)
Wayne [name] Date of Birth:  29-Sep-1929 [facility] / [facility] [address]   [address] [place], [place]  [address] [phone]           Fax   [phone]  HPI: This pleasant 77 year old gentleman is seen for a scheduled followup office visit.  He has a history of established atrial fibrillation.  He has a history of a remote non-ST elevation myocardial infarction and also a remote embolic stroke.  He had PCI to the distal obtuse marginal in 2007 at the time of his acute myocardial infarction.  He has not been experiencing any subsequent chest pain or angina  Current Outpatient Prescriptions  Medication Sig Dispense Refill  . aspirin 81 MG tablet Take 81 mg by mouth. One tablet every other day       . clopidogrel (PLAVIX) 75 MG tablet Taking every fourth day      . digoxin (LANOXIN) 0.125 MG tablet Take 125 mcg by mouth daily.        . Glucosamine-Chondroitin (GLUCOSAMINE CHONDR COMPLEX PO) Take by mouth.        . metoprolol tartrate (LOPRESSOR) 25 MG tablet Take 25 mg by mouth. One tablet in am, 1/2 tablet in pm       . Multiple Vitamins-Minerals (MULTIVITAMIN WITH MINERALS) tablet Take 1 tablet by mouth daily.        . Saw Palmetto, Serenoa repens, (SAW PALMETTO BERRY PO) Take by mouth.        . triamcinolone (KENALOG) 0.1 % cream Apply topically 2 (two) times daily. As directed by P.A. Tollie Eth with Dr. Terri Piedra       . warfarin (COUMADIN) 2.5 MG tablet TAKE 1 TABLET BY MOUTH TWICE A WEEK, AND 2 TABLET BY MOUTH 5 DAYS A WEEK OR AS DIRECTED  48 tablet  5    Allergies  Allergen Reactions  . Novocain   . Penicillins Rash    Patient Active Problem List  Diagnoses  . Atrial fibrillation  . Unspecified cerebral artery occlusion with cerebral infarction  . Chronic anticoagulation  . HTN (hypertension)  . CAD (coronary artery disease)    History  Smoking status  . Never Smoker   Smokeless tobacco  . Never Used    History  Alcohol Use No     Family History  Problem Relation Age of Onset  . Heart disease Mother   . Heart disease Father     Review of Systems: The patient denies any heat or cold intolerance.  No weight gain or weight loss.  The patient denies headaches or blurry vision.  There is no cough or sputum production.  The patient denies dizziness.  There is no hematuria or hematochezia.  The patient denies any muscle aches or arthritis.  The patient denies any rash.  The patient denies frequent falling or instability.  There is no history of depression or anxiety.  All other systems were reviewed and are negative.   Physical Exam: Filed Vitals:   05/08/11 1144  BP: 124/80  Pulse: 66   the general appearance reveals a well-developed well-nourished gentleman in no distress.Pupils equal and reactive.   Extraocular Movements are full.  There is no scleral icterus.  The mouth and pharynx are normal.  The neck is supple.  The carotids reveal no bruits.  The jugular venous pressure is normal.  The thyroid is not enlarged.  There is no lymphadenopathy.  The chest is clear to percussion and auscultation. There are no  rales or rhonchi. Expansion of the chest is symmetrical.  Heart reveals a soft apical murmur of mitral regurgitation.  The pulse is irregular in atrial fibrillation.The abdomen is soft and nontender. Bowel sounds are normal. The liver and spleen are not enlarged. There Are no abdominal masses. There are no bruits.  The pedal pulses are good.  There is no phlebitis or edema.  There is no cyanosis or clubbing. Neurologic exam reveals very slight slurring of his speech as a residual from his previous stroke.  The skin is warm and dry.  There is no rash.     Assessment / Plan: Continue same medication.  Avoid gaining weight over the winter.  Re- check in 3 months

## 2011-06-21 ENCOUNTER — Ambulatory Visit (INDEPENDENT_AMBULATORY_CARE_PROVIDER_SITE_OTHER): Payer: Medicare Other | Admitting: *Deleted

## 2011-06-21 DIAGNOSIS — I6350 Cerebral infarction due to unspecified occlusion or stenosis of unspecified cerebral artery: Secondary | ICD-10-CM

## 2011-06-21 DIAGNOSIS — I4891 Unspecified atrial fibrillation: Secondary | ICD-10-CM

## 2011-06-21 LAB — POCT INR: INR: 3.2

## 2011-06-21 MED ORDER — DIGOXIN 125 MCG PO TABS: 125.0000 ug | ORAL_TABLET | Freq: Every day | ORAL | Status: DC

## 2011-07-19 ENCOUNTER — Telehealth: Payer: Self-pay | Admitting: Cardiology

## 2011-07-19 ENCOUNTER — Ambulatory Visit (INDEPENDENT_AMBULATORY_CARE_PROVIDER_SITE_OTHER): Payer: Medicare Other | Admitting: Pharmacist

## 2011-07-19 ENCOUNTER — Other Ambulatory Visit: Payer: Self-pay | Admitting: *Deleted

## 2011-07-19 DIAGNOSIS — I4891 Unspecified atrial fibrillation: Secondary | ICD-10-CM

## 2011-07-19 NOTE — Telephone Encounter (Signed)
Walk in pt Form " Pt Needs REfill on Metoprolol" sent to [facility] 07/19/11/KM

## 2011-07-20 ENCOUNTER — Telehealth: Payer: Self-pay | Admitting: *Deleted

## 2011-07-20 MED ORDER — METOPROLOL TARTRATE 25 MG PO TABS: 25.0000 mg | ORAL_TABLET | ORAL | Status: DC

## 2011-07-20 NOTE — Telephone Encounter (Signed)
Advised patient called metoprolol to [facility]

## 2011-07-20 NOTE — Telephone Encounter (Signed)
FU Call: Pt calling to check on status of pt refill. Please return pt call to discuss further.

## 2011-07-20 NOTE — Telephone Encounter (Signed)
Refilled metoprolol 

## 2011-08-09 ENCOUNTER — Ambulatory Visit (INDEPENDENT_AMBULATORY_CARE_PROVIDER_SITE_OTHER): Payer: Medicare Other | Admitting: Pharmacist

## 2011-08-09 ENCOUNTER — Ambulatory Visit (INDEPENDENT_AMBULATORY_CARE_PROVIDER_SITE_OTHER): Payer: Medicare Other | Admitting: Cardiology

## 2011-08-09 ENCOUNTER — Encounter: Payer: Self-pay | Admitting: Cardiology

## 2011-08-09 ENCOUNTER — Ambulatory Visit (INDEPENDENT_AMBULATORY_CARE_PROVIDER_SITE_OTHER)
Admission: RE | Admit: 2011-08-09 | Discharge: 2011-08-09 | Disposition: A | Discharge status: Home / Self Care | Payer: Medicare Other | Source: Ambulatory Visit | Attending: Cardiology | Admitting: Cardiology

## 2011-08-09 VITALS — BP 126/80 | HR 66 | Ht 71.0 in | Wt 197.0 lb

## 2011-08-09 DIAGNOSIS — I119 Hypertensive heart disease without heart failure: Secondary | ICD-10-CM

## 2011-08-09 DIAGNOSIS — I6350 Cerebral infarction due to unspecified occlusion or stenosis of unspecified cerebral artery: Secondary | ICD-10-CM

## 2011-08-09 DIAGNOSIS — I4891 Unspecified atrial fibrillation: Secondary | ICD-10-CM

## 2011-08-09 DIAGNOSIS — Z7901 Long term (current) use of anticoagulants: Secondary | ICD-10-CM

## 2011-08-09 DIAGNOSIS — R05 Cough: Secondary | ICD-10-CM

## 2011-08-09 DIAGNOSIS — R059 Cough, unspecified: Secondary | ICD-10-CM

## 2011-08-09 NOTE — Progress Notes (Signed)
Wayne [name] Date of Birth:  1930-01-22 [facility] [address] [place], [place]  [address] [phone]  Fax   [phone]  HPI: This pleasant gentleman is seen for a scheduled followup office visit.  He had an acute myocardial infarction in 2007 treated with PCI to the distal obtuse marginal artery.  He also has a past history of a remote embolic stroke at the time of his NSTEMI.  Stroke has left him with mild dysarthria.  Patient has had no recurrent TIA or stroke symptoms.  He does have a history of established atrial fibrillation and is on he has had a recent cough and actually has had a lot of chest congestion for several months.  His last chest x-ray was more than 5 years ago.  Current Outpatient Prescriptions  Medication Sig Dispense Refill  . aspirin 81 MG tablet Take 81 mg by mouth. One tablet every other day       . clopidogrel (PLAVIX) 75 MG tablet Taking every fourth day      . digoxin (LANOXIN) 0.125 MG tablet Take 1 tablet (125 mcg total) by mouth daily.  90 tablet  4  . Glucosamine-Chondroitin (GLUCOSAMINE CHONDR COMPLEX PO) Take by mouth.        . metoprolol tartrate (LOPRESSOR) 25 MG tablet Take 1 tablet (25 mg total) by mouth as directed. One tablet in am, 1/2 tablet in pm  90 tablet  3  . Multiple Vitamins-Minerals (MULTIVITAMIN WITH MINERALS) tablet Take 1 tablet by mouth daily.        . Saw Palmetto, Serenoa repens, (SAW PALMETTO BERRY PO) Take by mouth.        . triamcinolone (KENALOG) 0.1 % cream Apply topically 2 (two) times daily. As directed by P.A. Tollie Eth with Dr. Terri Piedra       . warfarin (COUMADIN) 2.5 MG tablet TAKE 1 TABLET BY MOUTH TWICE A WEEK, AND 2 TABLET BY MOUTH 5 DAYS A WEEK OR AS DIRECTED  48 tablet  5    Allergies  Allergen Reactions  . Novocain   . Penicillins Rash    Patient Active Problem List  Diagnoses  . Atrial fibrillation  . Unspecified cerebral artery occlusion with cerebral infarction  .  Chronic anticoagulation  . HTN (hypertension)  . CAD (coronary artery disease)    History  Smoking status  . Never Smoker   Smokeless tobacco  . Never Used    History  Alcohol Use No    Family History  Problem Relation Age of Onset  . Heart disease Mother   . Heart disease Father     Review of Systems: The patient denies any heat or cold intolerance.  No weight gain or weight loss.  The patient denies headaches or blurry vision.  There is no cough or sputum production.  The patient denies dizziness.  There is no hematuria or hematochezia.  The patient denies any muscle aches or arthritis.  The patient denies any rash.  The patient denies frequent falling or instability.  There is no history of depression or anxiety.  All other systems were reviewed and are negative.   Physical Exam: Filed Vitals:   08/09/11 1125  BP: 126/80  Pulse: 66   the general appearance reveals a well-developed well-nourished gentleman in no distress.Pupils equal and reactive.   Extraocular Movements are full.  There is no scleral icterus.  The mouth and pharynx are normal.  The neck is supple.  The carotids reveal no bruits.  The jugular venous pressure is normal.  The thyroid is not enlarged.  There is no lymphadenopathy.  The chest is clear to percussion and auscultation. There are no rales or rhonchi. Expansion of the chest is symmetrical.  Heart reveals a soft basilar systolic murmur.The abdomen is soft and nontender. Bowel sounds are normal. The liver and spleen are not enlarged. There Are no abdominal masses. There are no bruits.  The pedal pulses are good.  There is no phlebitis or edema.  There is no cyanosis or clubbing. Neurologic exam reveals slight slurred speech as a residual from his prior stroke    Assessment / Plan: Continue same medication.  The chest x-ray today.  Recheck in 3 months for followup office visit.

## 2011-08-09 NOTE — Assessment & Plan Note (Signed)
The patient has had no new symptoms referable to his atrial fibrillation.  He is not having any symptoms of congestive heart failure or angina pectoris.

## 2011-08-09 NOTE — Assessment & Plan Note (Signed)
The patient is running a fever.  He's not having any purulent sputum at this point.  His cough is persisting and we will send him for a chest x-ray today.

## 2011-08-09 NOTE — Patient Instructions (Signed)
Will have you go for chest xray at the [place] building across from [facility] and call you with the results Your physician recommends that you schedule a follow-up appointment in: 3 months

## 2011-08-09 NOTE — Assessment & Plan Note (Signed)
The patient has had no bleeding problems from his anticoagulation.  Most recent INR was 3.1 today.

## 2011-08-14 ENCOUNTER — Telehealth: Payer: Self-pay | Admitting: *Deleted

## 2011-08-14 NOTE — Telephone Encounter (Signed)
Message copied by Burnell Blanks on Mon Aug 14, 2011 11:14 AM ------      Message from: Cassell Clement      Created: Wed Aug 09, 2011  9:00 PM       Please report.  The lungs are clear. No pneumonia.

## 2011-08-14 NOTE — Telephone Encounter (Signed)
Advised wife of xray results 

## 2011-08-23 ENCOUNTER — Other Ambulatory Visit: Payer: Self-pay | Admitting: *Deleted

## 2011-08-23 MED ORDER — WARFARIN SODIUM 2.5 MG PO TABS: 2.5000 mg | ORAL_TABLET | ORAL | Status: DC

## 2011-09-06 ENCOUNTER — Ambulatory Visit (INDEPENDENT_AMBULATORY_CARE_PROVIDER_SITE_OTHER): Payer: Medicare Other | Admitting: *Deleted

## 2011-09-06 DIAGNOSIS — I6350 Cerebral infarction due to unspecified occlusion or stenosis of unspecified cerebral artery: Secondary | ICD-10-CM

## 2011-09-06 DIAGNOSIS — I4891 Unspecified atrial fibrillation: Secondary | ICD-10-CM

## 2011-10-05 ENCOUNTER — Ambulatory Visit (INDEPENDENT_AMBULATORY_CARE_PROVIDER_SITE_OTHER): Payer: Medicare Other | Admitting: Pharmacist

## 2011-10-05 DIAGNOSIS — I6350 Cerebral infarction due to unspecified occlusion or stenosis of unspecified cerebral artery: Secondary | ICD-10-CM

## 2011-10-05 DIAGNOSIS — I4891 Unspecified atrial fibrillation: Secondary | ICD-10-CM

## 2011-10-05 LAB — POCT INR: INR: 2.3

## 2011-10-23 ENCOUNTER — Other Ambulatory Visit: Payer: Self-pay | Admitting: Cardiology

## 2011-11-15 ENCOUNTER — Ambulatory Visit (INDEPENDENT_AMBULATORY_CARE_PROVIDER_SITE_OTHER): Payer: Medicare Other | Admitting: Cardiology

## 2011-11-15 ENCOUNTER — Ambulatory Visit (INDEPENDENT_AMBULATORY_CARE_PROVIDER_SITE_OTHER): Payer: Medicare Other | Admitting: Pharmacist

## 2011-11-15 ENCOUNTER — Encounter: Payer: Self-pay | Admitting: Cardiology

## 2011-11-15 VITALS — BP 118/68 | HR 64 | Ht 71.0 in | Wt 201.8 lb

## 2011-11-15 DIAGNOSIS — I6350 Cerebral infarction due to unspecified occlusion or stenosis of unspecified cerebral artery: Secondary | ICD-10-CM

## 2011-11-15 DIAGNOSIS — I4891 Unspecified atrial fibrillation: Secondary | ICD-10-CM

## 2011-11-15 DIAGNOSIS — R05 Cough: Secondary | ICD-10-CM

## 2011-11-15 DIAGNOSIS — I10 Essential (primary) hypertension: Secondary | ICD-10-CM

## 2011-11-15 DIAGNOSIS — R059 Cough, unspecified: Secondary | ICD-10-CM

## 2011-11-15 LAB — POCT INR: INR: 2.7

## 2011-11-15 NOTE — Assessment & Plan Note (Signed)
The patient has had no further TIA episodes.  He still has some dysarthria of his speech.  His dysarthria is worse first thing in the morning and also late in the day when he is tired

## 2011-11-15 NOTE — Assessment & Plan Note (Signed)
The patient has a lot of phlegm which collects in his throat troponin I.  He awakens 3 times a night to have to go to the bathroom and that is when he notices it.  We suggested a trial of Mucinex 600 mg twice a day to help cut down on the production of sputum

## 2011-11-15 NOTE — Progress Notes (Signed)
Rosiland Oz Date of Birth:  Jul 12, 1929 [facility] [address] [place], [place]  [address] [phone]  Fax   [phone]  HPI: This pleasant 78 year old gentleman is seen for a scheduled 3 month followup office visit.  He has known ischemic heart disease.  He had an acute myocardial infarction in 2007 treated with PCI to the distal marginal artery.  He has a history of a remote embolic stroke at the time of his non-STEMI.  His been on long-term Coumadin for atrial fibrillation.  Since last visit he has been doing well from a cardiac standpoint.  Continue to have a lot of postnasal drainage and throat congestion which causes him to have to expectorate several times during the night  Current Outpatient Prescriptions  Medication Sig Dispense Refill  . aspirin 81 MG tablet Take 81 mg by mouth. One tablet every other day       . clopidogrel (PLAVIX) 75 MG tablet TAKE 1 TABLET EVERY 4 DAYS  22 tablet  4  . digoxin (LANOXIN) 0.125 MG tablet Take 1 tablet (125 mcg total) by mouth daily.  90 tablet  4  . Glucosamine-Chondroitin (GLUCOSAMINE CHONDR COMPLEX PO) Take by mouth.        Marland Kitchen guaiFENesin (MUCINEX) 600 MG 12 hr tablet Take 600 mg by mouth 2 (two) times daily.      . metoprolol tartrate (LOPRESSOR) 25 MG tablet Take 1 tablet (25 mg total) by mouth as directed. One tablet in am, 1/2 tablet in pm  90 tablet  3  . Multiple Vitamins-Minerals (MULTIVITAMIN WITH MINERALS) tablet Take 1 tablet by mouth daily.        . Saw Palmetto, Serenoa repens, (SAW PALMETTO BERRY PO) Take by mouth.        . triamcinolone (KENALOG) 0.1 % cream Apply topically 2 (two) times daily. As directed by P.A. Tollie Eth with Dr. Terri Piedra       . warfarin (COUMADIN) 2.5 MG tablet Take 1 tablet (2.5 mg total) by mouth as directed.  154 tablet  1    Allergies  Allergen Reactions  . Procaine Hcl   . Penicillins Rash    Patient Active Problem List  Diagnoses  . Atrial fibrillation  .  Unspecified cerebral artery occlusion with cerebral infarction  . Chronic anticoagulation  . HTN (hypertension)  . CAD (coronary artery disease)  . Cough    History  Smoking status  . Never Smoker   Smokeless tobacco  . Never Used    History  Alcohol Use No    Family History  Problem Relation Age of Onset  . Heart disease Mother   . Heart disease Father     Review of Systems: The patient denies any heat or cold intolerance.  No weight gain or weight loss.  The patient denies headaches or blurry vision.  There is no cough or sputum production.  The patient denies dizziness.  There is no hematuria or hematochezia.  The patient denies any muscle aches or arthritis.  The patient denies any rash.  The patient denies frequent falling or instability.  There is no history of depression or anxiety.  All other systems were reviewed and are negative.   Physical Exam: Filed Vitals:   11/15/11 1152  BP: 118/68  Pulse: 64   the general appearance reveals a well-developed well-nourished elderly gentleman in no distress.Pupils equal and reactive.   Extraocular Movements are full.  There is no scleral icterus.  The mouth and pharynx  are normal.  The neck is supple.  The carotids reveal no bruits.  The jugular venous pressure is normal.  The thyroid is not enlarged.  There is no lymphadenopathy.  The chest is clear to percussion and auscultation. There are no rales or rhonchi. Expansion of the chest is symmetrical.  The heart reveals an irregular rhythm and no murmur gallop rub or click. The abdomen is soft and nontender. Bowel sounds are normal. The liver and spleen are not enlarged. There Are no abdominal masses. There are no bruits.  The pedal pulses are good.  There is no phlebitis or edema.  There is no cyanosis or clubbing. Neurological shows mild dysarthria.The skin is warm and dry.  There is no rash.     Assessment / Plan: We did a chest x-ray on 08/09/11 which was satisfactory.   He'll continue same medication and add trial of Mucinex to help with his nocturnal mucous production.  Recheck in 3 months for followup office visit and EKG

## 2011-11-15 NOTE — Patient Instructions (Signed)
Start Mucinex 600 mg plain twice a day as needed  Your physician recommends that you schedule a follow-up appointment in: 3 months

## 2011-11-15 NOTE — Assessment & Plan Note (Signed)
Blood pressure has been stable on current therapy.  Is not having any dizzy spells or syncope

## 2011-12-27 ENCOUNTER — Ambulatory Visit (INDEPENDENT_AMBULATORY_CARE_PROVIDER_SITE_OTHER): Payer: Medicare Other | Admitting: Pharmacist

## 2011-12-27 DIAGNOSIS — I4891 Unspecified atrial fibrillation: Secondary | ICD-10-CM

## 2011-12-27 DIAGNOSIS — I6350 Cerebral infarction due to unspecified occlusion or stenosis of unspecified cerebral artery: Secondary | ICD-10-CM

## 2012-01-08 ENCOUNTER — Telehealth: Payer: Self-pay | Admitting: Cardiology

## 2012-01-08 NOTE — Telephone Encounter (Signed)
INR is at low end of range 2.3 so it is okay for him to take the cipro. If he is on it for more than 1 week get INR at one week.

## 2012-01-08 NOTE — Telephone Encounter (Signed)
Pt's wife calling to make sure pt can take cipro for an urinary infection? pls call [phone]

## 2012-01-08 NOTE — Telephone Encounter (Signed)
Patient on Warfarin will forward to  Dr. Patty Sermons for review

## 2012-01-08 NOTE — Telephone Encounter (Signed)
Discussed with wife and they are not going to start Cipro.  Patient was at Urgent Care yesterday secondary to low back pain.  Urine checked,trace of blood and no other symptoms.  Did use weed eater on Friday for the first time in six years. Back xray ok.  Took a muscle relaxer yesterday and back much better. They will call back if they decide to start

## 2012-01-08 NOTE — Telephone Encounter (Signed)
Left message to call back  

## 2012-02-15 ENCOUNTER — Ambulatory Visit (INDEPENDENT_AMBULATORY_CARE_PROVIDER_SITE_OTHER): Payer: Medicare Other

## 2012-02-15 ENCOUNTER — Encounter: Payer: Self-pay | Admitting: Cardiology

## 2012-02-15 ENCOUNTER — Ambulatory Visit (INDEPENDENT_AMBULATORY_CARE_PROVIDER_SITE_OTHER): Payer: Medicare Other | Admitting: Cardiology

## 2012-02-15 VITALS — BP 120/70 | HR 69 | Ht 71.0 in | Wt 199.0 lb

## 2012-02-15 DIAGNOSIS — I6350 Cerebral infarction due to unspecified occlusion or stenosis of unspecified cerebral artery: Secondary | ICD-10-CM

## 2012-02-15 DIAGNOSIS — I4891 Unspecified atrial fibrillation: Secondary | ICD-10-CM

## 2012-02-15 DIAGNOSIS — I2510 Atherosclerotic heart disease of native coronary artery without angina pectoris: Secondary | ICD-10-CM

## 2012-02-15 LAB — POCT INR: INR: 3.5

## 2012-02-15 NOTE — Assessment & Plan Note (Signed)
The patient walks regularly for exercise.  He has not had any symptoms of congestive heart failure.  He has had no further TIA symptoms.  He takes aspirin every other day 81 mg and he takes Plavix every fourth day.

## 2012-02-15 NOTE — Progress Notes (Signed)
Wayne [name] Date of Birth:  [birth year] [facility] [address] [place], [place]  [address] [phone]  Fax   [phone]  HPI: This pleasant 78 year old gentleman is seen for a scheduled 3 month followup office visit. He has known ischemic heart disease. He had an acute myocardial infarction in 2007 treated with PCI to the distal marginal artery. He has a history of a remote embolic stroke at the time of his non-STEMI. His been on long-term Coumadin for atrial fibrillation. Since last visit he has been doing well from a cardiac standpoint.  He has not been having any chest pain or shortness of breath.  He has not had any TIA symptoms.  Since we last saw him he injured his back after he stood back up after picking something up at an awkward angle.  He has pain in the right lower back.  He has had a trial of steroids.  He had an MRI done earlier this week and he has an appointment to see his orthopedist Dr. Darrelyn Hillock tomorrow.   Current Outpatient Prescriptions  Medication Sig Dispense Refill  . aspirin 81 MG tablet Take 81 mg by mouth. One tablet every other day       . clopidogrel (PLAVIX) 75 MG tablet TAKE 1 TABLET EVERY 4 DAYS  22 tablet  4  . cyclobenzaprine (FLEXERIL) 10 MG tablet as needed.      . digoxin (LANOXIN) 0.125 MG tablet Take 1 tablet (125 mcg total) by mouth daily.  90 tablet  4  . Glucosamine-Chondroitin (GLUCOSAMINE CHONDR COMPLEX PO) Take by mouth.        Marland Kitchen guaiFENesin (MUCINEX) 600 MG 12 hr tablet Take 600 mg by mouth 2 (two) times daily.      Marland Kitchen HYDROcodone-acetaminophen (NORCO/VICODIN) 5-325 MG per tablet as needed.      . metoprolol tartrate (LOPRESSOR) 25 MG tablet Take 1 tablet (25 mg total) by mouth as directed. One tablet in am, 1/2 tablet in pm  90 tablet  3  . Multiple Vitamins-Minerals (MULTIVITAMIN WITH MINERALS) tablet Take 1 tablet by mouth daily.        . Saw Palmetto, Serenoa repens, (SAW PALMETTO BERRY PO) Take by mouth.         . triamcinolone (KENALOG) 0.1 % cream Apply topically 2 (two) times daily. As directed by P.A. Tollie Eth with Dr. Terri Piedra       . warfarin (COUMADIN) 2.5 MG tablet Take 1 tablet (2.5 mg total) by mouth as directed.  154 tablet  1    Allergies  Allergen Reactions  . Procaine Hcl   . Penicillins Rash    Patient Active Problem List  Diagnosis  . Atrial fibrillation  . Unspecified cerebral artery occlusion with cerebral infarction  . Chronic anticoagulation  . HTN (hypertension)  . CAD (coronary artery disease)  . Cough    History  Smoking status  . Never Smoker   Smokeless tobacco  . Never Used    History  Alcohol Use No    Family History  Problem Relation Age of Onset  . Heart disease Mother   . Heart disease Father     Review of Systems: The patient denies any heat or cold intolerance.  No weight gain or weight loss.  The patient denies headaches or blurry vision.  There is no cough or sputum production.  The patient denies dizziness.  There is no hematuria or hematochezia.  The patient denies any muscle aches or arthritis.  The patient denies any rash.  The patient denies frequent falling or instability.  There is no history of depression or anxiety.  All other systems were reviewed and are negative.   Physical Exam: Filed Vitals:   02/15/12 1117  BP: 120/70  Pulse: 69   the general appearance reveals a well-developed well-nourished gentleman in no distress.The head and neck exam reveals pupils equal and reactive.  Extraocular movements are full.  There is no scleral icterus.  The mouth and pharynx are normal.  The neck is supple.  The carotids reveal no bruits.  The jugular venous pressure is normal.  The  thyroid is not enlarged.  There is no lymphadenopathy.  The chest is clear to percussion and auscultation.  There are no rales or rhonchi.  Expansion of the chest is symmetrical.  The precordium is quiet.  The pulse is irregular The first heart sound is normal.   The second heart sound is physiologically split.  There is no  gallop rub or click.  There is a soft apical systolic murmur. There is no abnormal lift or heave.  The abdomen is soft and nontender.  The bowel sounds are normal.  The liver and spleen are not enlarged.  There are no abdominal masses.  There are no abdominal bruits.  Extremities reveal good pedal pulses.  There is no phlebitis or edema.  There is no cyanosis or clubbing.  Strength is normal and symmetrical in all extremities.  There is no lateralizing weakness.  There are no sensory deficits.  The skin is warm and dry.  There is no rash.   EKG today shows atrial fibrillation trolled ventricular response, and nonspecific ST-T wave changes   Assessment / Plan: Continue same medication.  Recheck in 3 months

## 2012-02-15 NOTE — Assessment & Plan Note (Signed)
No recurrent chest pain or angina 

## 2012-02-15 NOTE — Patient Instructions (Addendum)
Your physician recommends that you continue on your current medications as directed. Please refer to the Current Medication list given to you today.  Your physician recommends that you schedule a follow-up appointment in: 3 months  

## 2012-02-28 ENCOUNTER — Other Ambulatory Visit: Payer: Self-pay | Admitting: Cardiology

## 2012-02-28 NOTE — Telephone Encounter (Signed)
New Problem:    Patient's wife called in needing a refill of the patient's warfarin (COUMADIN) 2.5 MG tablet.

## 2012-03-14 ENCOUNTER — Ambulatory Visit (INDEPENDENT_AMBULATORY_CARE_PROVIDER_SITE_OTHER): Payer: Medicare Other | Admitting: *Deleted

## 2012-03-14 DIAGNOSIS — I4891 Unspecified atrial fibrillation: Secondary | ICD-10-CM

## 2012-03-14 DIAGNOSIS — I6350 Cerebral infarction due to unspecified occlusion or stenosis of unspecified cerebral artery: Secondary | ICD-10-CM

## 2012-03-14 LAB — POCT INR: INR: 1.9

## 2012-03-15 ENCOUNTER — Other Ambulatory Visit: Payer: Self-pay | Admitting: Cardiology

## 2012-03-15 MED ORDER — METOPROLOL TARTRATE 25 MG PO TABS: 25.0000 mg | ORAL_TABLET | ORAL | Status: DC

## 2012-04-10 ENCOUNTER — Ambulatory Visit (INDEPENDENT_AMBULATORY_CARE_PROVIDER_SITE_OTHER): Payer: Medicare Other | Admitting: Pharmacist

## 2012-04-10 DIAGNOSIS — I6350 Cerebral infarction due to unspecified occlusion or stenosis of unspecified cerebral artery: Secondary | ICD-10-CM

## 2012-04-10 DIAGNOSIS — I4891 Unspecified atrial fibrillation: Secondary | ICD-10-CM

## 2012-05-21 ENCOUNTER — Encounter: Payer: Self-pay | Admitting: Cardiology

## 2012-05-21 ENCOUNTER — Ambulatory Visit (INDEPENDENT_AMBULATORY_CARE_PROVIDER_SITE_OTHER): Payer: Medicare Other

## 2012-05-21 ENCOUNTER — Ambulatory Visit (INDEPENDENT_AMBULATORY_CARE_PROVIDER_SITE_OTHER): Payer: Medicare Other | Admitting: Cardiology

## 2012-05-21 VITALS — BP 120/78 | HR 68 | Ht 71.0 in | Wt 198.8 lb

## 2012-05-21 DIAGNOSIS — I6350 Cerebral infarction due to unspecified occlusion or stenosis of unspecified cerebral artery: Secondary | ICD-10-CM

## 2012-05-21 DIAGNOSIS — I4891 Unspecified atrial fibrillation: Secondary | ICD-10-CM

## 2012-05-21 DIAGNOSIS — I10 Essential (primary) hypertension: Secondary | ICD-10-CM

## 2012-05-21 NOTE — Progress Notes (Signed)
Rosiland Oz Date of Birth:  Sep 20, 1929 [facility] [address] [place], [place]  [address] [phone]  Fax   [phone]  HPI: This pleasant 78 year old gentleman is seen for a scheduled 3 month followup office visit. He has known ischemic heart disease. He had an acute myocardial infarction in 2007 treated with PCI to the distal marginal artery. He has a history of a remote embolic stroke at the time of his non-STEMI. His been on long-term Coumadin for atrial fibrillation. Since last visit he has been doing well from a cardiac standpoint. He has not been having any chest pain or shortness of breath. He has not had any TIA symptoms.  He walks daily for exercise.   Current Outpatient Prescriptions  Medication Sig Dispense Refill  . aspirin 81 MG tablet Take 81 mg by mouth. One tablet every other day       . clopidogrel (PLAVIX) 75 MG tablet TAKE 1 TABLET EVERY 4 DAYS  22 tablet  4  . cyclobenzaprine (FLEXERIL) 10 MG tablet as needed.      . digoxin (LANOXIN) 0.125 MG tablet Take 1 tablet (125 mcg total) by mouth daily.  90 tablet  4  . Glucosamine-Chondroitin (GLUCOSAMINE CHONDR COMPLEX PO) Take by mouth.        Marland Kitchen guaiFENesin (MUCINEX) 600 MG 12 hr tablet Take 600 mg by mouth 2 (two) times daily.      Marland Kitchen HYDROcodone-acetaminophen (NORCO/VICODIN) 5-325 MG per tablet as needed.      . metoprolol tartrate (LOPRESSOR) 25 MG tablet Take 1 tablet (25 mg total) by mouth as directed. One tablet in am, 1/2 tablet in pm  90 tablet  3  . Multiple Vitamins-Minerals (MULTIVITAMIN WITH MINERALS) tablet Take 1 tablet by mouth daily.        . Saw Palmetto, Serenoa repens, (SAW PALMETTO BERRY PO) Take by mouth.        . triamcinolone (KENALOG) 0.1 % cream Apply topically 2 (two) times daily. As directed by P.A. Tollie Eth with Dr. Terri Piedra       . warfarin (COUMADIN) 2.5 MG tablet Take as directed by anticoagulation clinic  180 tablet  1    Allergies  Allergen  Reactions  . Procaine Hcl   . Penicillins Rash    Patient Active Problem List  Diagnosis  . Atrial fibrillation  . Unspecified cerebral artery occlusion with cerebral infarction  . Chronic anticoagulation  . HTN (hypertension)  . CAD (coronary artery disease)  . Cough    History  Smoking status  . Never Smoker   Smokeless tobacco  . Never Used    History  Alcohol Use No    Family History  Problem Relation Age of Onset  . Heart disease Mother   . Heart disease Father     Review of Systems: The patient denies any heat or cold intolerance.  No weight gain or weight loss.  The patient denies headaches or blurry vision.  There is no cough or sputum production.  The patient denies dizziness.  There is no hematuria or hematochezia.  The patient denies any muscle aches or arthritis.  The patient denies any rash.  The patient denies frequent falling or instability.  There is no history of depression or anxiety.  All other systems were reviewed and are negative.   Physical Exam: Filed Vitals:   05/21/12 1101  BP: 120/78  Pulse: 68   the general appearance reveals a well-developed well-nourished elderly gentleman in no distress.The  head and neck exam reveals pupils equal and reactive.  Extraocular movements are full.  There is no scleral icterus.  The mouth and pharynx are normal.  The neck is supple.  The carotids reveal no bruits.  The jugular venous pressure is normal.  The  thyroid is not enlarged.  There is no lymphadenopathy.  The chest is clear to percussion and auscultation.  There are no rales or rhonchi.  Expansion of the chest is symmetrical.  The precordium is quiet.  The first heart sound is normal.  The second heart sound is physiologically split.  There is no murmur gallop rub or click.  There is no abnormal lift or heave.  The abdomen is soft and nontender.  The bowel sounds are normal.  The liver and spleen are not enlarged.  There are no abdominal masses.  There are no  abdominal bruits.  Extremities reveal good pedal pulses.  There is no phlebitis or edema.  There is no cyanosis or clubbing.  Strength is normal and symmetrical in all extremities.  There is no lateralizing weakness.  There are no sensory deficits.  The skin is warm and dry.  There is no rash.      Assessment / Plan: Continue same medication.  Recheck in 4 months for followup office visit

## 2012-05-21 NOTE — Assessment & Plan Note (Signed)
The patient remains in atrial fibrillation.  He has not been experiencing any problems from his Coumadin.  He has had no TIA symptoms.

## 2012-05-21 NOTE — Assessment & Plan Note (Signed)
The patient has not been experiencing any headaches or dizzy spells.  No Chest pain.

## 2012-05-21 NOTE — Patient Instructions (Addendum)
Your physician recommends that you continue on your current medications as directed. Please refer to the Current Medication list given to you today.  Your physician wants you to follow-up in: 4 MONTHS.  You will receive a reminder letter in the mail two months in advance. If you don't receive a letter, please call our office to schedule the follow-up appointment.  

## 2012-07-02 ENCOUNTER — Ambulatory Visit (INDEPENDENT_AMBULATORY_CARE_PROVIDER_SITE_OTHER): Payer: Medicare Other

## 2012-07-02 DIAGNOSIS — I6350 Cerebral infarction due to unspecified occlusion or stenosis of unspecified cerebral artery: Secondary | ICD-10-CM

## 2012-07-02 DIAGNOSIS — I4891 Unspecified atrial fibrillation: Secondary | ICD-10-CM

## 2012-08-13 ENCOUNTER — Ambulatory Visit (INDEPENDENT_AMBULATORY_CARE_PROVIDER_SITE_OTHER): Payer: Medicare Other

## 2012-08-13 DIAGNOSIS — I6350 Cerebral infarction due to unspecified occlusion or stenosis of unspecified cerebral artery: Secondary | ICD-10-CM

## 2012-08-13 DIAGNOSIS — I4891 Unspecified atrial fibrillation: Secondary | ICD-10-CM

## 2012-09-04 ENCOUNTER — Other Ambulatory Visit: Payer: Self-pay

## 2012-09-04 MED ORDER — DIGOXIN 125 MCG PO TABS: 125.0000 ug | ORAL_TABLET | Freq: Every day | ORAL | Status: DC

## 2012-09-11 ENCOUNTER — Other Ambulatory Visit: Payer: Self-pay | Admitting: *Deleted

## 2012-09-11 MED ORDER — DIGOXIN 125 MCG PO TABS: 0.1250 mg | ORAL_TABLET | Freq: Every day | ORAL | Status: DC

## 2012-09-18 ENCOUNTER — Ambulatory Visit (INDEPENDENT_AMBULATORY_CARE_PROVIDER_SITE_OTHER): Payer: Medicare Other | Admitting: Pharmacist

## 2012-09-18 ENCOUNTER — Ambulatory Visit (INDEPENDENT_AMBULATORY_CARE_PROVIDER_SITE_OTHER): Payer: Medicare Other | Admitting: Cardiology

## 2012-09-18 ENCOUNTER — Encounter: Payer: Self-pay | Admitting: Cardiology

## 2012-09-18 VITALS — BP 112/68 | HR 58 | Ht 70.5 in | Wt 200.0 lb

## 2012-09-18 DIAGNOSIS — I6350 Cerebral infarction due to unspecified occlusion or stenosis of unspecified cerebral artery: Secondary | ICD-10-CM

## 2012-09-18 DIAGNOSIS — Z7901 Long term (current) use of anticoagulants: Secondary | ICD-10-CM

## 2012-09-18 DIAGNOSIS — I10 Essential (primary) hypertension: Secondary | ICD-10-CM

## 2012-09-18 DIAGNOSIS — I359 Nonrheumatic aortic valve disorder, unspecified: Secondary | ICD-10-CM

## 2012-09-18 DIAGNOSIS — I350 Nonrheumatic aortic (valve) stenosis: Secondary | ICD-10-CM

## 2012-09-18 DIAGNOSIS — I2510 Atherosclerotic heart disease of native coronary artery without angina pectoris: Secondary | ICD-10-CM

## 2012-09-18 DIAGNOSIS — I4891 Unspecified atrial fibrillation: Secondary | ICD-10-CM

## 2012-09-18 NOTE — Progress Notes (Signed)
Wayne [name] Date of Birth:  25-Dec-1929 [facility] [address] [place], [place]  [address] [phone]  Fax   [phone]  HPI: This pleasant 79 year old gentleman is seen for a scheduled 4 month followup office visit. He has known ischemic heart disease. He had an acute myocardial infarction in 2007 treated with PCI to the distal marginal artery. He has a history of a remote embolic stroke at the time of his non-STEMI. His been on long-term Coumadin for atrial fibrillation. Since last visit he has been doing well from a cardiac standpoint. He has not been having any chest pain or shortness of breath. He has not had any TIA symptoms. He walks daily for exercise.  His last echocardiogram was in 2007 at the time of his non-STEMI and showed an ejection fraction of 55%, aortic valve sclerosis, and mild mitral regurgitation.   Current Outpatient Prescriptions  Medication Sig Dispense Refill  . aspirin 81 MG tablet Take 81 mg by mouth. One tablet every other day       . clopidogrel (PLAVIX) 75 MG tablet TAKE 1 TABLET EVERY 4 DAYS  22 tablet  4  . cyclobenzaprine (FLEXERIL) 10 MG tablet as needed.      . digoxin (LANOXIN) 0.125 MG tablet Take 1 tablet (0.125 mg total) by mouth daily.  90 tablet  2  . Glucosamine-Chondroitin (GLUCOSAMINE CHONDR COMPLEX PO) Take by mouth.        Marland Kitchen guaiFENesin (MUCINEX) 600 MG 12 hr tablet Take 600 mg by mouth 2 (two) times daily.      Marland Kitchen HYDROcodone-acetaminophen (NORCO/VICODIN) 5-325 MG per tablet as needed.      . metoprolol tartrate (LOPRESSOR) 25 MG tablet Take 1 tablet (25 mg total) by mouth as directed. One tablet in am, 1/2 tablet in pm  90 tablet  3  . Multiple Vitamins-Minerals (MULTIVITAMIN WITH MINERALS) tablet Take 1 tablet by mouth daily.        . Saw Palmetto, Serenoa repens, (SAW PALMETTO BERRY PO) Take by mouth.        . triamcinolone (KENALOG) 0.1 % cream Apply topically 2 (two) times daily. As directed by P.A. Tollie Eth with Dr. Terri Piedra       . warfarin (COUMADIN) 2.5 MG tablet Take as directed by anticoagulation clinic  180 tablet  1   No current facility-administered medications for this visit.    Allergies  Allergen Reactions  . Procaine Hcl   . Penicillins Rash    Patient Active Problem List  Diagnosis  . Atrial fibrillation  . Unspecified cerebral artery occlusion with cerebral infarction  . Chronic anticoagulation  . HTN (hypertension)  . CAD (coronary artery disease)  . Cough    History  Smoking status  . Never Smoker   Smokeless tobacco  . Never Used    History  Alcohol Use No    Family History  Problem Relation Age of Onset  . Heart disease Mother   . Heart disease Father     Review of Systems: The patient denies any heat or cold intolerance.  No weight gain or weight loss.  The patient denies headaches or blurry vision.  There is no cough or sputum production.  The patient denies dizziness.  There is no hematuria or hematochezia.  The patient denies any muscle aches or arthritis.  The patient denies any rash.  The patient denies frequent falling or instability.  There is no history of depression or anxiety.  All other systems  were reviewed and are negative.   Physical Exam: Filed Vitals:   09/18/12 1047  BP: 112/68  Pulse: 58   the general appearance reveals a well-developed well-nourished elderly gentleman in no distress.The head and neck exam reveals pupils equal and reactive.  Extraocular movements are full.  There is no scleral icterus.  The mouth and pharynx are normal.  The neck is supple.  The carotids reveal no bruits.  The jugular venous pressure is normal.  The  thyroid is not enlarged.  There is no lymphadenopathy.  The chest is clear to percussion and auscultation.  There are no rales or rhonchi.  Expansion of the chest is symmetrical.  The precordium is quiet.  The first heart sound is normal.  The second heart sound is physiologically split.  There is  no  gallop rub or click.  There is a grade 2/6 holosystolic murmur at apex consistent with mitral regurgitation.  There is also a systolic ejection murmur at the base.  No diastolic murmur. There is no abnormal lift or heave.  The abdomen is soft and nontender.  The bowel sounds are normal.  The liver and spleen are not enlarged.  There are no abdominal masses.  There are no abdominal bruits.  Extremities reveal good pedal pulses.  There is no phlebitis or edema.  There is no cyanosis or clubbing.  Strength is normal and symmetrical in all extremities.  There is no lateralizing weakness.  There are no sensory deficits.  The skin is warm and dry.  There is no rash.      Assessment / Plan: Continue same medication.  His last echocardiogram was in 2007 and showed aortic valve sclerosis as well as mitral regurgitation and an ejection fraction of 55%.  We will plan to update his echo prior to his next visit.  He will return in 4 months for followup office visit CBC lipid panel hepatic function panel and basal metabolic panel.

## 2012-09-18 NOTE — Assessment & Plan Note (Signed)
The patient has a history of ischemic heart disease with previous stent in 2007.  He has not had any recurrent chest pain or angina.  We will plan to check fasting lipids at his next visit.

## 2012-09-18 NOTE — Assessment & Plan Note (Signed)
The patient is on chronic Coumadin anticoagulation for his atrial fibrillation.  He has not had any recurrent TIA symptoms.  For his coronary disease he takes Plavix every fourth day and baby aspirin every other day.

## 2012-09-18 NOTE — Assessment & Plan Note (Signed)
Blood pressure has been staying stable on current therapy.  He is not having any symptoms of dizziness or syncope.  No symptoms of CHF.

## 2012-09-18 NOTE — Patient Instructions (Addendum)
Your physician recommends that you continue on your current medications as directed. Please refer to the Current Medication list given to you today.  Your physician wants you to follow-up in: 4 months with fasting labs (lp/bmet/hfp/cbc) and 2 D echo before visit You will receive a reminder letter in the mail two months in advance. If you don't receive a letter, please call our office to schedule the follow-up appointment.   Your physician has requested that you have an echocardiogram. Echocardiography is a painless test that uses sound waves to create images of your heart. It provides your doctor with information about the size and shape of your heart and how well your heart's chambers and valves are working. This procedure takes approximately one hour. There are no restrictions for this procedure.

## 2012-09-27 ENCOUNTER — Other Ambulatory Visit: Payer: Self-pay | Admitting: *Deleted

## 2012-09-27 MED ORDER — WARFARIN SODIUM 2.5 MG PO TABS: ORAL_TABLET | ORAL | Status: DC

## 2012-09-27 NOTE — Telephone Encounter (Signed)
Refilled as requested by wife

## 2012-10-23 ENCOUNTER — Ambulatory Visit (INDEPENDENT_AMBULATORY_CARE_PROVIDER_SITE_OTHER): Payer: Medicare Other | Admitting: Pharmacist

## 2012-10-23 DIAGNOSIS — I6350 Cerebral infarction due to unspecified occlusion or stenosis of unspecified cerebral artery: Secondary | ICD-10-CM

## 2012-10-23 DIAGNOSIS — I4891 Unspecified atrial fibrillation: Secondary | ICD-10-CM

## 2012-11-07 ENCOUNTER — Other Ambulatory Visit: Payer: Self-pay | Admitting: *Deleted

## 2012-11-07 MED ORDER — METOPROLOL TARTRATE 25 MG PO TABS: 25.0000 mg | ORAL_TABLET | ORAL | Status: DC

## 2012-11-12 ENCOUNTER — Telehealth: Payer: Self-pay | Admitting: Cardiology

## 2012-11-12 NOTE — Telephone Encounter (Signed)
Will forward to  Dr. Brackbill for review 

## 2012-11-12 NOTE — Telephone Encounter (Signed)
Okay to leave off coumadin 3 days.

## 2012-11-12 NOTE — Telephone Encounter (Signed)
New problem    Need to come off blood thinner for 3 days to have tooth extraction.

## 2012-11-13 NOTE — Telephone Encounter (Signed)
Advised patient of Warfarin, questioned Plavix. Will forward to  Dr. Patty Sermons to see if he wants him to continue Plavix or hold as well.

## 2012-11-13 NOTE — Telephone Encounter (Signed)
Hold plavix as well.

## 2012-11-13 NOTE — Telephone Encounter (Signed)
Advised patient

## 2012-11-27 ENCOUNTER — Ambulatory Visit (INDEPENDENT_AMBULATORY_CARE_PROVIDER_SITE_OTHER): Payer: Medicare Other | Admitting: *Deleted

## 2012-11-27 DIAGNOSIS — I6350 Cerebral infarction due to unspecified occlusion or stenosis of unspecified cerebral artery: Secondary | ICD-10-CM

## 2012-11-27 DIAGNOSIS — I4891 Unspecified atrial fibrillation: Secondary | ICD-10-CM

## 2012-11-27 LAB — POCT INR: INR: 1.8

## 2013-01-08 ENCOUNTER — Other Ambulatory Visit: Payer: Self-pay | Admitting: Cardiology

## 2013-01-09 ENCOUNTER — Ambulatory Visit (HOSPITAL_COMMUNITY): Payer: Medicare Other | Attending: Cardiovascular Disease | Admitting: Radiology

## 2013-01-09 ENCOUNTER — Ambulatory Visit (INDEPENDENT_AMBULATORY_CARE_PROVIDER_SITE_OTHER): Payer: Medicare Other | Admitting: Pharmacist

## 2013-01-09 ENCOUNTER — Encounter: Payer: Self-pay | Admitting: Cardiology

## 2013-01-09 ENCOUNTER — Ambulatory Visit (INDEPENDENT_AMBULATORY_CARE_PROVIDER_SITE_OTHER): Payer: Medicare Other | Admitting: Cardiology

## 2013-01-09 VITALS — BP 112/70 | HR 60 | Ht 71.0 in | Wt 197.8 lb

## 2013-01-09 DIAGNOSIS — I079 Rheumatic tricuspid valve disease, unspecified: Secondary | ICD-10-CM | POA: Insufficient documentation

## 2013-01-09 DIAGNOSIS — I350 Nonrheumatic aortic (valve) stenosis: Secondary | ICD-10-CM

## 2013-01-09 DIAGNOSIS — Z8673 Personal history of transient ischemic attack (TIA), and cerebral infarction without residual deficits: Secondary | ICD-10-CM | POA: Insufficient documentation

## 2013-01-09 DIAGNOSIS — I10 Essential (primary) hypertension: Secondary | ICD-10-CM | POA: Insufficient documentation

## 2013-01-09 DIAGNOSIS — I4891 Unspecified atrial fibrillation: Secondary | ICD-10-CM

## 2013-01-09 DIAGNOSIS — I252 Old myocardial infarction: Secondary | ICD-10-CM | POA: Insufficient documentation

## 2013-01-09 DIAGNOSIS — I2510 Atherosclerotic heart disease of native coronary artery without angina pectoris: Secondary | ICD-10-CM

## 2013-01-09 DIAGNOSIS — R011 Cardiac murmur, unspecified: Secondary | ICD-10-CM | POA: Insufficient documentation

## 2013-01-09 DIAGNOSIS — I6350 Cerebral infarction due to unspecified occlusion or stenosis of unspecified cerebral artery: Secondary | ICD-10-CM

## 2013-01-09 DIAGNOSIS — I359 Nonrheumatic aortic valve disorder, unspecified: Secondary | ICD-10-CM | POA: Insufficient documentation

## 2013-01-09 LAB — POCT INR: INR: 2.1

## 2013-01-09 NOTE — Progress Notes (Signed)
Wayne [name] Date of Birth:  Jan 05, 1930 [facility] [address] [place], [place]  [address] [phone]  Fax   [phone]  HPI: This pleasant 79 year old gentleman is seen for a scheduled 4 month followup office visit. He has known ischemic heart disease. He had an acute myocardial infarction in 2007 treated with PCI to the distal marginal artery. He has a history of a remote embolic stroke at the time of his non-STEMI. His been on long-term Coumadin for atrial fibrillation. Since last visit he has been doing well from a cardiac standpoint. He has not been having any chest pain or shortness of breath. He has not had any TIA symptoms. He walks daily for exercise. His last echocardiogram was in 2007 at the time of his non-STEMI and showed an ejection fraction of 55%, aortic valve sclerosis, and mild mitral regurgitation.  Repeat echocardiogram was done earlier today, results pending.  Current Outpatient Prescriptions  Medication Sig Dispense Refill  . aspirin 81 MG tablet Take 81 mg by mouth. One tablet every other day       . clopidogrel (PLAVIX) 75 MG tablet TAKE 1 TABLET EVERY 4 DAYS  22 tablet  4  . cyclobenzaprine (FLEXERIL) 10 MG tablet as needed.      . digoxin (LANOXIN) 0.125 MG tablet Take 1 tablet (0.125 mg total) by mouth daily.  90 tablet  2  . Glucosamine-Chondroitin (GLUCOSAMINE CHONDR COMPLEX PO) Take by mouth.        Marland Kitchen guaiFENesin (MUCINEX) 600 MG 12 hr tablet Take 600 mg by mouth 2 (two) times daily.      Marland Kitchen HYDROcodone-acetaminophen (NORCO/VICODIN) 5-325 MG per tablet as needed.      . metoprolol tartrate (LOPRESSOR) 25 MG tablet Take 1 tablet (25 mg total) by mouth as directed. One tablet in am, 1/2 tablet in pm  90 tablet  3  . Multiple Vitamins-Minerals (MULTIVITAMIN WITH MINERALS) tablet Take 1 tablet by mouth daily.        . Saw Palmetto, Serenoa repens, (SAW PALMETTO BERRY PO) Take by mouth.        . triamcinolone (KENALOG) 0.1 % cream  Apply topically 2 (two) times daily. As directed by P.A. Tollie Eth with Dr. Terri Piedra       . warfarin (COUMADIN) 2.5 MG tablet Take as directed by anticoagulation clinic  180 tablet  1   No current facility-administered medications for this visit.    Allergies  Allergen Reactions  . Procaine Hcl   . Penicillins Rash    Patient Active Problem List   Diagnosis Date Noted  . Cough 08/09/2011  . Chronic anticoagulation   . HTN (hypertension)   . CAD (coronary artery disease)   . Atrial fibrillation 09/13/2010  . Unspecified cerebral artery occlusion with cerebral infarction 09/13/2010    History  Smoking status  . Never Smoker   Smokeless tobacco  . Never Used    History  Alcohol Use No    Family History  Problem Relation Age of Onset  . Heart disease Mother   . Heart disease Father     Review of Systems: The patient denies any heat or cold intolerance.  No weight gain or weight loss.  The patient denies headaches or blurry vision.  There is no cough or sputum production.  The patient denies dizziness.  There is no hematuria or hematochezia.  The patient denies any muscle aches or arthritis.  The patient denies any rash.  The patient denies frequent  falling or instability.  There is no history of depression or anxiety.  All other systems were reviewed and are negative.   Physical Exam: Filed Vitals:   01/09/13 1114  BP: 112/70  Pulse: 60   the general appearance reveals a well-developed well-nourished elderly gentleman in no distress.. his weight is down 3 pounds since last visit.The head and neck exam reveals pupils equal and reactive.  Extraocular movements are full.  There is no scleral icterus.  The mouth and pharynx are normal.  The neck is supple.  The carotids reveal no bruits.  The jugular venous pressure is normal.  The  thyroid is not enlarged.  There is no lymphadenopathy.  The chest is clear to percussion and auscultation.  There are no rales or rhonchi.   Expansion of the chest is symmetrical.  The pulse is irregularly irregular. The precordium is quiet.  The first heart sound is normal.  The second heart sound is physiologically split.  There is there is a soft systolic ejection murmur at the aortic area.  No diastolic murmur.  There is no abnormal lift or heave.  The abdomen is soft and nontender.  The bowel sounds are normal.  The liver and spleen are not enlarged.  There are no abdominal masses.  There are no abdominal bruits.  Extremities reveal good pedal pulses.  There is no phlebitis or edema.  There is no cyanosis or clubbing.  Strength is normal and symmetrical in all extremities.  There is no lateralizing weakness.  There are no sensory deficits.  The skin is warm and dry.  There is no rash.     Assessment / Plan: Patient is doing well.  Continue same medication.  Recheck in 4 months for office visit EKG lipid panel hepatic function panel and basal metabolic panel.  We will call the patient and the echocardiogram results when available.

## 2013-01-09 NOTE — Assessment & Plan Note (Signed)
Blood pressure was remaining stable on current medication.  No headaches dizziness or syncope.  No palpitations.  No recurrent TIA or stroke symptoms.

## 2013-01-09 NOTE — Assessment & Plan Note (Signed)
Patient remains on long-term Coumadin.  His INRs have been therapeutic.  He has not been experiencing any TIA symptoms.  No symptoms of congestive heart failure.

## 2013-01-09 NOTE — Patient Instructions (Addendum)
Your physician recommends that you continue on your current medications as directed. Please refer to the Current Medication list given to you today.  Your physician wants you to follow-up in: 4 months with fasting labs (lp/bmet/hfp) and ekg You will receive a reminder letter in the mail two months in advance. If you don't receive a letter, please call our office to schedule the follow-up appointment.  

## 2013-01-09 NOTE — Progress Notes (Signed)
Echocardiogram performed.  

## 2013-01-09 NOTE — Assessment & Plan Note (Signed)
Patient has had no recurrent chest pain or angina.  He takes aspirin every other day and Plavix every fourth day.  He does bruise easily because of his anticoagulation including warfarin

## 2013-02-20 ENCOUNTER — Ambulatory Visit (INDEPENDENT_AMBULATORY_CARE_PROVIDER_SITE_OTHER): Payer: Medicare Other | Admitting: Pharmacist

## 2013-02-20 DIAGNOSIS — I6350 Cerebral infarction due to unspecified occlusion or stenosis of unspecified cerebral artery: Secondary | ICD-10-CM

## 2013-02-20 DIAGNOSIS — I4891 Unspecified atrial fibrillation: Secondary | ICD-10-CM

## 2013-02-28 ENCOUNTER — Other Ambulatory Visit: Payer: Self-pay | Admitting: Cardiology

## 2013-03-10 ENCOUNTER — Other Ambulatory Visit: Payer: Self-pay | Admitting: Cardiology

## 2013-03-13 ENCOUNTER — Ambulatory Visit (INDEPENDENT_AMBULATORY_CARE_PROVIDER_SITE_OTHER): Payer: Medicare Other | Admitting: *Deleted

## 2013-03-13 DIAGNOSIS — I6350 Cerebral infarction due to unspecified occlusion or stenosis of unspecified cerebral artery: Secondary | ICD-10-CM

## 2013-03-13 DIAGNOSIS — I4891 Unspecified atrial fibrillation: Secondary | ICD-10-CM

## 2013-03-13 LAB — POCT INR: INR: 3.2

## 2013-04-04 ENCOUNTER — Other Ambulatory Visit: Payer: Self-pay | Admitting: Cardiology

## 2013-04-10 ENCOUNTER — Ambulatory Visit (INDEPENDENT_AMBULATORY_CARE_PROVIDER_SITE_OTHER): Payer: Medicare Other | Admitting: General Practice

## 2013-04-10 DIAGNOSIS — I6350 Cerebral infarction due to unspecified occlusion or stenosis of unspecified cerebral artery: Secondary | ICD-10-CM

## 2013-04-10 DIAGNOSIS — I4891 Unspecified atrial fibrillation: Secondary | ICD-10-CM

## 2013-04-10 LAB — POCT INR: INR: 3.1

## 2013-04-28 ENCOUNTER — Other Ambulatory Visit: Payer: Self-pay | Admitting: Cardiology

## 2013-05-08 ENCOUNTER — Ambulatory Visit (INDEPENDENT_AMBULATORY_CARE_PROVIDER_SITE_OTHER): Payer: Medicare Other | Admitting: *Deleted

## 2013-05-08 DIAGNOSIS — I6350 Cerebral infarction due to unspecified occlusion or stenosis of unspecified cerebral artery: Secondary | ICD-10-CM

## 2013-05-08 DIAGNOSIS — I4891 Unspecified atrial fibrillation: Secondary | ICD-10-CM

## 2013-06-02 ENCOUNTER — Ambulatory Visit (INDEPENDENT_AMBULATORY_CARE_PROVIDER_SITE_OTHER): Payer: Medicare Other | Admitting: *Deleted

## 2013-06-02 ENCOUNTER — Ambulatory Visit (INDEPENDENT_AMBULATORY_CARE_PROVIDER_SITE_OTHER): Payer: Medicare Other | Admitting: Cardiology

## 2013-06-02 ENCOUNTER — Encounter: Payer: Self-pay | Admitting: Cardiology

## 2013-06-02 VITALS — BP 142/70 | HR 76 | Wt 198.0 lb

## 2013-06-02 DIAGNOSIS — R059 Cough, unspecified: Secondary | ICD-10-CM

## 2013-06-02 DIAGNOSIS — I6350 Cerebral infarction due to unspecified occlusion or stenosis of unspecified cerebral artery: Secondary | ICD-10-CM

## 2013-06-02 DIAGNOSIS — I4891 Unspecified atrial fibrillation: Secondary | ICD-10-CM

## 2013-06-02 DIAGNOSIS — R05 Cough: Secondary | ICD-10-CM

## 2013-06-02 DIAGNOSIS — I2510 Atherosclerotic heart disease of native coronary artery without angina pectoris: Secondary | ICD-10-CM

## 2013-06-02 DIAGNOSIS — I10 Essential (primary) hypertension: Secondary | ICD-10-CM

## 2013-06-02 LAB — POCT INR: INR: 2.4

## 2013-06-02 NOTE — Assessment & Plan Note (Signed)
The patient has a cough productive of small amounts of dark mucus when he wakes up in the middle of the night to go to the bathroom.  He does not have much problem during the day.  I suggested that he try some Mucinex when the thick mucous becomes problematic for him.  Because of his previous similar complaints we did do a chest x-ray last year which was unremarkable.

## 2013-06-02 NOTE — Assessment & Plan Note (Signed)
The patient is on chronic Coumadin for his established permanent atrial fibrillation.  He has not had any TIA or stroke symptoms.  He is not having increased exertional dyspnea.  He does find that it is difficult for him to lie on either side in bed at night because he has trouble breathing.  He does not have any orthopnea or paroxysmal nocturnal dyspnea.

## 2013-06-02 NOTE — Patient Instructions (Signed)
Continue same medications.   Your physician wants you to follow-up in: 4 months.  You will receive a reminder letter in the mail two months in advance. If you don't receive a letter, please call our office to schedule the follow-up appointment.  

## 2013-06-02 NOTE — Assessment & Plan Note (Signed)
The patient has not had any recurrent chest pain or angina. 

## 2013-06-02 NOTE — Progress Notes (Signed)
Wayne [name] Date of Birth:  04-11-1930 [address] [place], [place]  [address] [phone]  Fax   [phone]  HPI: This pleasant 79 year old gentleman is seen for a scheduled 4 month followup office visit. He has known ischemic heart disease. He had an acute myocardial infarction in 2007 treated with PCI to the distal marginal artery. He has a history of a remote embolic stroke at the time of his non-STEMI. His been on long-term Coumadin for atrial fibrillation. Since last visit he has been doing well from a cardiac standpoint. He has not been having any chest pain or shortness of breath. He has not had any TIA symptoms. He walks daily for exercise.  His last echocardiogram was 01/09/13.  It showed an ejection fraction of 55-60%.  There was mild to moderate aortic stenosis with peak gradient 24 and mean gradient of 15.  There was mild mitral regurgitation and there was biatrial enlargement.  Current Outpatient Prescriptions  Medication Sig Dispense Refill  . aspirin 81 MG tablet Take 81 mg by mouth. One tablet every other day       . clopidogrel (PLAVIX) 75 MG tablet TAKE 1 TABLET EVERY 4 DAYS  22 tablet  4  . cyclobenzaprine (FLEXERIL) 10 MG tablet as needed.      . digoxin (LANOXIN) 0.125 MG tablet Take 1 tablet (0.125 mg total) by mouth daily.  90 tablet  2  . Glucosamine-Chondroitin (GLUCOSAMINE CHONDR COMPLEX PO) Take 2 tablets by mouth daily.       Marland Kitchen guaiFENesin (MUCINEX) 600 MG 12 hr tablet Take 600 mg by mouth 2 (two) times daily.      Marland Kitchen HYDROcodone-acetaminophen (NORCO/VICODIN) 5-325 MG per tablet as needed.      . metoprolol tartrate (LOPRESSOR) 25 MG tablet One tablet in am, 1/2 tablet in pm      . Multiple Vitamins-Minerals (MULTIVITAMIN WITH MINERALS) tablet Take 1 tablet by mouth daily.        . Saw Palmetto, Serenoa repens, (SAW PALMETTO BERRY PO) Take 1 tablet by mouth daily.       Marland Kitchen triamcinolone (KENALOG) 0.1 % cream Apply topically 2 (two)  times daily. As directed by P.A. Tollie Eth with Dr. Terri Piedra       . warfarin (COUMADIN) 2.5 MG tablet TAKE AS DIRECTED BY ANTICOAGULATION CLINIC  180 tablet  1   No current facility-administered medications for this visit.    Allergies  Allergen Reactions  . Procaine Hcl   . Penicillins Rash    Patient Active Problem List   Diagnosis Date Noted  . Cough 08/09/2011  . Chronic anticoagulation   . HTN (hypertension)   . CAD (coronary artery disease)   . Atrial fibrillation 09/13/2010  . Unspecified cerebral artery occlusion with cerebral infarction 09/13/2010    History  Smoking status  . Never Smoker   Smokeless tobacco  . Never Used    History  Alcohol Use No    Family History  Problem Relation Age of Onset  . Heart disease Mother   . Heart disease Father     Review of Systems: The patient denies any heat or cold intolerance.  No weight gain or weight loss.  The patient denies headaches or blurry vision.  There is no cough or sputum production.  The patient denies dizziness.  There is no hematuria or hematochezia.  The patient denies any muscle aches or arthritis.  The patient denies any rash.  The patient denies frequent  falling or instability.  There is no history of depression or anxiety.  All other systems were reviewed and are negative.   Physical Exam: Filed Vitals:   06/02/13 1549  BP: 142/70  Pulse: 76   the general appearance reveals a well-developed well-nourished elderly gentleman in no distress.. his weight is down 3 pounds since last visit.The head and neck exam reveals pupils equal and reactive.  Extraocular movements are full.  There is no scleral icterus.  The mouth and pharynx are normal.  The neck is supple.  The carotids reveal no bruits.  The jugular venous pressure is normal.  The  thyroid is not enlarged.  There is no lymphadenopathy.  The chest is clear to percussion and auscultation.  There are no rales or rhonchi.  Expansion of the chest is  symmetrical.  The pulse is irregularly irregular. The precordium is quiet.  The first heart sound is normal.  The second heart sound is physiologically split.  There is there is a soft systolic ejection murmur at the aortic area.  No diastolic murmur.  There is no abnormal lift or heave.  The abdomen is soft and nontender.  The bowel sounds are normal.  The liver and spleen are not enlarged.  There are no abdominal masses.  There are no abdominal bruits.  Extremities reveal good pedal pulses.  There is no phlebitis or edema.  There is no cyanosis or clubbing.  Strength is normal and symmetrical in all extremities.  There is no lateralizing weakness.  There are no sensory deficits.  The skin is warm and dry.  There is no rash.  EKG shows atrial fibrillation with controlled ventricular response and no ischemic changes.   Assessment / Plan: The patient continues to do well.  Continue same medication.  Use Mucinex when necessary for thick postnasal drainage.  Recheck in 4 months for followup office visit

## 2013-06-18 ENCOUNTER — Other Ambulatory Visit: Payer: Self-pay

## 2013-06-18 MED ORDER — METOPROLOL TARTRATE 25 MG PO TABS: ORAL_TABLET | ORAL | Status: DC

## 2013-06-22 ENCOUNTER — Other Ambulatory Visit: Payer: Self-pay | Admitting: Cardiology

## 2013-07-07 ENCOUNTER — Other Ambulatory Visit: Payer: Self-pay | Admitting: *Deleted

## 2013-07-07 ENCOUNTER — Ambulatory Visit (INDEPENDENT_AMBULATORY_CARE_PROVIDER_SITE_OTHER): Payer: Managed Care, Other (non HMO) | Admitting: Pharmacist

## 2013-07-07 DIAGNOSIS — I6350 Cerebral infarction due to unspecified occlusion or stenosis of unspecified cerebral artery: Secondary | ICD-10-CM

## 2013-07-07 DIAGNOSIS — I4891 Unspecified atrial fibrillation: Secondary | ICD-10-CM

## 2013-07-07 LAB — POCT INR: INR: 1.9

## 2013-07-07 MED ORDER — METOPROLOL TARTRATE 25 MG PO TABS: ORAL_TABLET | ORAL | Status: DC

## 2013-08-06 ENCOUNTER — Ambulatory Visit (INDEPENDENT_AMBULATORY_CARE_PROVIDER_SITE_OTHER): Payer: Managed Care, Other (non HMO) | Admitting: *Deleted

## 2013-08-06 DIAGNOSIS — I6350 Cerebral infarction due to unspecified occlusion or stenosis of unspecified cerebral artery: Secondary | ICD-10-CM

## 2013-08-06 DIAGNOSIS — Z5181 Encounter for therapeutic drug level monitoring: Secondary | ICD-10-CM | POA: Insufficient documentation

## 2013-08-06 DIAGNOSIS — I4891 Unspecified atrial fibrillation: Secondary | ICD-10-CM

## 2013-08-06 LAB — POCT INR: INR: 2.4

## 2013-09-03 ENCOUNTER — Ambulatory Visit (INDEPENDENT_AMBULATORY_CARE_PROVIDER_SITE_OTHER): Payer: Managed Care, Other (non HMO) | Admitting: Pharmacist

## 2013-09-03 DIAGNOSIS — I4891 Unspecified atrial fibrillation: Secondary | ICD-10-CM

## 2013-09-03 DIAGNOSIS — I6350 Cerebral infarction due to unspecified occlusion or stenosis of unspecified cerebral artery: Secondary | ICD-10-CM

## 2013-09-03 DIAGNOSIS — Z5181 Encounter for therapeutic drug level monitoring: Secondary | ICD-10-CM

## 2013-09-03 LAB — POCT INR: INR: 3.4

## 2013-09-08 ENCOUNTER — Other Ambulatory Visit: Payer: Self-pay | Admitting: *Deleted

## 2013-09-08 MED ORDER — METOPROLOL TARTRATE 25 MG PO TABS: ORAL_TABLET | ORAL | Status: DC

## 2013-09-15 ENCOUNTER — Other Ambulatory Visit: Payer: Self-pay | Admitting: Cardiology

## 2013-10-15 ENCOUNTER — Encounter: Payer: Self-pay | Admitting: Cardiology

## 2013-10-15 ENCOUNTER — Ambulatory Visit (INDEPENDENT_AMBULATORY_CARE_PROVIDER_SITE_OTHER): Payer: Managed Care, Other (non HMO) | Admitting: Cardiology

## 2013-10-15 ENCOUNTER — Ambulatory Visit (INDEPENDENT_AMBULATORY_CARE_PROVIDER_SITE_OTHER): Payer: Managed Care, Other (non HMO) | Admitting: *Deleted

## 2013-10-15 VITALS — BP 126/74 | HR 74 | Ht 71.0 in | Wt 196.0 lb

## 2013-10-15 DIAGNOSIS — Z5181 Encounter for therapeutic drug level monitoring: Secondary | ICD-10-CM

## 2013-10-15 DIAGNOSIS — I4891 Unspecified atrial fibrillation: Secondary | ICD-10-CM

## 2013-10-15 DIAGNOSIS — I6350 Cerebral infarction due to unspecified occlusion or stenosis of unspecified cerebral artery: Secondary | ICD-10-CM

## 2013-10-15 DIAGNOSIS — I10 Essential (primary) hypertension: Secondary | ICD-10-CM

## 2013-10-15 DIAGNOSIS — I2510 Atherosclerotic heart disease of native coronary artery without angina pectoris: Secondary | ICD-10-CM

## 2013-10-15 LAB — POCT INR: INR: 2.1

## 2013-10-15 NOTE — Patient Instructions (Signed)
Your physician recommends that you continue on your current medications as directed. Please refer to the Current Medication list given to you today.  Your physician wants you to follow-up in: 6 months with fasting labs (lp/bmet/hfp) AND EKG  You will receive a reminder letter in the mail two months in advance. If you don't receive a letter, please call our office to schedule the follow-up appointment.  

## 2013-10-15 NOTE — Assessment & Plan Note (Signed)
The patient has not been having any chest pain or angina.  He walks every day for exercise.  He also does his own yard work.  He prefers walking behind a cart at a place like [employer].

## 2013-10-15 NOTE — Progress Notes (Signed)
Wayne [name] Date of Birth:  Feb 14, 1930 [place] [address] [place], [place]  [address] [phone]        Fax   [phone]   History of Present Illness: This pleasant 80 year old gentleman is seen for a scheduled 4 month followup office visit. He has known ischemic heart disease. He had an acute myocardial infarction in 2007 treated with PCI to the distal marginal artery. He has a history of a remote embolic stroke at the time of his non-STEMI. His been on long-term Coumadin for atrial fibrillation. Since last visit he has been doing well from a cardiac standpoint. He has not been having any chest pain or shortness of breath. He has not had any TIA symptoms. He walks daily for exercise. His last echocardiogram was 01/09/13. It showed an ejection fraction of 55-60%. There was mild to moderate aortic stenosis with peak gradient 24 and mean gradient of 15. There was mild mitral regurgitation and there was biatrial enlargement.   Current Outpatient Prescriptions  Medication Sig Dispense Refill  . aspirin 81 MG tablet Take 81 mg by mouth. One tablet every other day       . clopidogrel (PLAVIX) 75 MG tablet TAKE 1 TABLET EVERY 4 DAYS  22 tablet  4  . digoxin (LANOXIN) 0.125 MG tablet TAKE 1 TABLET (0.125 MG TOTAL) BY MOUTH DAILY.  90 tablet  1  . Glucosamine-Chondroitin (GLUCOSAMINE CHONDR COMPLEX PO) Take 2 tablets by mouth daily.       Marland Kitchen guaiFENesin (MUCINEX) 600 MG 12 hr tablet Take 600 mg by mouth 2 (two) times daily.      . metoprolol tartrate (LOPRESSOR) 25 MG tablet One tablet in am, 1/2 tablet in pm  135 tablet  0  . Saw Palmetto, Serenoa repens, (SAW PALMETTO BERRY PO) Take 1 tablet by mouth daily.       Marland Kitchen triamcinolone (KENALOG) 0.1 % cream Apply topically 2 (two) times daily. As directed by P.A. Lennie Odor with Dr. Allyson Sabal       . warfarin (COUMADIN) 2.5 MG tablet TAKE AS DIRECTED BY ANTICOAGULATION CLINIC  180 tablet  1  . cyclobenzaprine  (FLEXERIL) 10 MG tablet as needed.      Marland Kitchen HYDROcodone-acetaminophen (NORCO/VICODIN) 5-325 MG per tablet as needed.      . Multiple Vitamins-Minerals (MULTIVITAMIN WITH MINERALS) tablet Take 1 tablet by mouth daily.         No current facility-administered medications for this visit.    Allergies  Allergen Reactions  . Procaine Hcl   . Penicillins Rash    Patient Active Problem List   Diagnosis Date Noted  . Encounter for therapeutic drug monitoring 08/06/2013  . Cough 08/09/2011  . Chronic anticoagulation   . HTN (hypertension)   . CAD (coronary artery disease)   . Atrial fibrillation 09/13/2010  . Unspecified cerebral artery occlusion with cerebral infarction 09/13/2010    History  Smoking status  . Never Smoker   Smokeless tobacco  . Never Used    History  Alcohol Use No    Family History  Problem Relation Age of Onset  . Heart disease Mother   . Heart disease Father     Review of Systems: Constitutional: no fever chills diaphoresis or fatigue or change in weight.  Head and neck: no hearing loss, no epistaxis, no photophobia or visual disturbance. Respiratory: No cough, shortness of breath or wheezing. Cardiovascular: No chest pain peripheral edema, palpitations. Gastrointestinal: No abdominal distention,  no abdominal pain, no change in bowel habits hematochezia or melena. Genitourinary: No dysuria, no frequency, no urgency, no nocturia. Musculoskeletal:No arthralgias, no back pain, no gait disturbance or myalgias. Neurological: No dizziness, no headaches, no numbness, no seizures, no syncope, no weakness, no tremors. Hematologic: No lymphadenopathy, no easy bruising. Psychiatric: No confusion, no hallucinations, no sleep disturbance.    Physical Exam: Filed Vitals:   10/15/13 1155  BP: 126/74  Pulse: 74   the general appearance reveals a well-developed well-nourished gentleman in no distress.The head and neck exam reveals pupils equal and reactive.   Extraocular movements are full.  There is no scleral icterus.  The mouth and pharynx are normal.  The neck is supple.  The carotids reveal no bruits.  The jugular venous pressure is normal.  The  thyroid is not enlarged.  There is no lymphadenopathy.  The chest is clear to percussion and auscultation.  There are no rales or rhonchi.  Expansion of the chest is symmetrical.  The precordium is quiet.  The pulse is irregularly irregular  The first heart sound is normal.  The second heart sound is physiologically split.  There is no gallop rub or click.  There is a grade 2/6 systolic ejection murmur at the base.  There is also a holosystolic grade 2/6 murmur at apex  There is no abnormal lift or heave.  The abdomen is soft and nontender.  The bowel sounds are normal.  The liver and spleen are not enlarged.  There are no abdominal masses.  There are no abdominal bruits.  Extremities reveal good pedal pulses.  There is no phlebitis or edema.  There is no cyanosis or clubbing.  Strength is normal and symmetrical in all extremities.  There is no lateralizing weakness.  There are no sensory deficits.  The skin is warm and dry.  There is no rash.     Assessment / Plan:  1. permanent atrial fibrillation 2. remote stroke 3.  Ischemic heart disease with history of acute myocardial infarction 2007 with PCI to the distal marginal artery 4. benign hypertensive heart disease without heart failure 5. mild aortic stenosis and mild mitral regurgitation by echo  Plan: Continue same medication.  Recheck in 6 months for office visit EKG lipid panel hepatic function panel and basal metabolic panel

## 2013-10-15 NOTE — Assessment & Plan Note (Signed)
The patient is not having any awareness of palpitations or tachycardia.  He remains in permanent atrial fibrillation.  No problems from the Coumadin.

## 2013-10-15 NOTE — Assessment & Plan Note (Signed)
Blood pressures remaining stable on current medication.  No headaches.  No dizziness or syncope.

## 2013-11-19 ENCOUNTER — Ambulatory Visit (INDEPENDENT_AMBULATORY_CARE_PROVIDER_SITE_OTHER): Payer: Managed Care, Other (non HMO) | Admitting: Surgery

## 2013-11-19 DIAGNOSIS — Z5181 Encounter for therapeutic drug level monitoring: Secondary | ICD-10-CM

## 2013-11-19 DIAGNOSIS — I4891 Unspecified atrial fibrillation: Secondary | ICD-10-CM

## 2013-11-19 DIAGNOSIS — I6350 Cerebral infarction due to unspecified occlusion or stenosis of unspecified cerebral artery: Secondary | ICD-10-CM

## 2013-11-19 LAB — POCT INR: INR: 2.1

## 2013-12-03 ENCOUNTER — Other Ambulatory Visit: Payer: Self-pay | Admitting: Cardiology

## 2013-12-08 ENCOUNTER — Other Ambulatory Visit: Payer: Self-pay | Admitting: Cardiology

## 2013-12-31 ENCOUNTER — Ambulatory Visit (INDEPENDENT_AMBULATORY_CARE_PROVIDER_SITE_OTHER): Payer: Managed Care, Other (non HMO) | Admitting: *Deleted

## 2013-12-31 DIAGNOSIS — I6350 Cerebral infarction due to unspecified occlusion or stenosis of unspecified cerebral artery: Secondary | ICD-10-CM

## 2013-12-31 DIAGNOSIS — I4891 Unspecified atrial fibrillation: Secondary | ICD-10-CM

## 2013-12-31 DIAGNOSIS — Z5181 Encounter for therapeutic drug level monitoring: Secondary | ICD-10-CM

## 2013-12-31 LAB — POCT INR: INR: 2.4

## 2014-01-26 ENCOUNTER — Telehealth: Payer: Self-pay | Admitting: Cardiology

## 2014-02-13 ENCOUNTER — Ambulatory Visit (INDEPENDENT_AMBULATORY_CARE_PROVIDER_SITE_OTHER): Payer: Managed Care, Other (non HMO)

## 2014-02-13 DIAGNOSIS — I4891 Unspecified atrial fibrillation: Secondary | ICD-10-CM

## 2014-02-13 DIAGNOSIS — Z5181 Encounter for therapeutic drug level monitoring: Secondary | ICD-10-CM

## 2014-02-13 DIAGNOSIS — I6350 Cerebral infarction due to unspecified occlusion or stenosis of unspecified cerebral artery: Secondary | ICD-10-CM

## 2014-02-13 LAB — POCT INR: INR: 2.4

## 2014-03-11 ENCOUNTER — Other Ambulatory Visit: Payer: Self-pay | Admitting: Cardiology

## 2014-03-12 ENCOUNTER — Other Ambulatory Visit: Payer: Self-pay | Admitting: Cardiology

## 2014-03-23 ENCOUNTER — Other Ambulatory Visit: Payer: Self-pay

## 2014-03-23 MED ORDER — CLOPIDOGREL BISULFATE 75 MG PO TABS: ORAL_TABLET | ORAL | Status: DC

## 2014-03-24 ENCOUNTER — Other Ambulatory Visit: Payer: Self-pay | Admitting: Cardiology

## 2014-03-31 ENCOUNTER — Ambulatory Visit (INDEPENDENT_AMBULATORY_CARE_PROVIDER_SITE_OTHER): Payer: Managed Care, Other (non HMO) | Admitting: *Deleted

## 2014-03-31 DIAGNOSIS — Z5181 Encounter for therapeutic drug level monitoring: Secondary | ICD-10-CM

## 2014-03-31 DIAGNOSIS — I639 Cerebral infarction, unspecified: Secondary | ICD-10-CM

## 2014-03-31 DIAGNOSIS — I6350 Cerebral infarction due to unspecified occlusion or stenosis of unspecified cerebral artery: Secondary | ICD-10-CM

## 2014-03-31 DIAGNOSIS — I4891 Unspecified atrial fibrillation: Secondary | ICD-10-CM

## 2014-03-31 LAB — POCT INR: INR: 2.6

## 2014-03-31 NOTE — Telephone Encounter (Signed)
A user error has taken place.

## 2014-04-22 ENCOUNTER — Ambulatory Visit: Payer: Managed Care, Other (non HMO) | Admitting: Cardiology

## 2014-05-05 ENCOUNTER — Ambulatory Visit: Payer: Managed Care, Other (non HMO) | Admitting: Cardiology

## 2014-05-18 ENCOUNTER — Encounter: Payer: Self-pay | Admitting: Cardiology

## 2014-05-18 ENCOUNTER — Ambulatory Visit (INDEPENDENT_AMBULATORY_CARE_PROVIDER_SITE_OTHER): Payer: Managed Care, Other (non HMO) | Admitting: Cardiology

## 2014-05-18 ENCOUNTER — Ambulatory Visit (INDEPENDENT_AMBULATORY_CARE_PROVIDER_SITE_OTHER): Payer: Managed Care, Other (non HMO)

## 2014-05-18 VITALS — BP 122/78 | HR 71 | Ht 71.0 in

## 2014-05-18 DIAGNOSIS — I482 Chronic atrial fibrillation, unspecified: Secondary | ICD-10-CM

## 2014-05-18 DIAGNOSIS — I6350 Cerebral infarction due to unspecified occlusion or stenosis of unspecified cerebral artery: Secondary | ICD-10-CM

## 2014-05-18 DIAGNOSIS — I10 Essential (primary) hypertension: Secondary | ICD-10-CM

## 2014-05-18 DIAGNOSIS — I4891 Unspecified atrial fibrillation: Secondary | ICD-10-CM

## 2014-05-18 DIAGNOSIS — I350 Nonrheumatic aortic (valve) stenosis: Secondary | ICD-10-CM

## 2014-05-18 DIAGNOSIS — I639 Cerebral infarction, unspecified: Secondary | ICD-10-CM

## 2014-05-18 DIAGNOSIS — Z5181 Encounter for therapeutic drug level monitoring: Secondary | ICD-10-CM

## 2014-05-18 LAB — LIPID PANEL
Cholesterol: 220 mg/dL — ABNORMAL HIGH (ref 0–200)
HDL: 26.3 mg/dL — ABNORMAL LOW (ref >39.00)
NonHDL: 193.7
Total CHOL/HDL Ratio: 8
Triglycerides: 520 mg/dL — ABNORMAL HIGH (ref 0.0–149.0)
VLDL: 104 mg/dL — ABNORMAL HIGH (ref 0.0–40.0)

## 2014-05-18 LAB — BASIC METABOLIC PANEL
BUN: 16 mg/dL (ref 6–23)
CALCIUM: 8.7 mg/dL (ref 8.4–10.5)
CO2: 26 mEq/L (ref 19–32)
CREATININE: 1 mg/dL (ref 0.4–1.5)
Chloride: 99 mEq/L (ref 96–112)
GFR: 72.54 mL/min (ref >60.00)
Glucose, Bld: 91 mg/dL (ref 70–99)
Potassium: 4.4 mEq/L (ref 3.5–5.1)
Sodium: 134 mEq/L — ABNORMAL LOW (ref 135–145)

## 2014-05-18 LAB — HEPATIC FUNCTION PANEL
ALK PHOS: 37 U/L — AB (ref 39–117)
ALT: 15 U/L (ref 0–53)
AST: 24 U/L (ref 0–37)
Albumin: 4 g/dL (ref 3.5–5.2)
BILIRUBIN DIRECT: 0 mg/dL (ref 0.0–0.3)
Total Bilirubin: 0.8 mg/dL (ref 0.2–1.2)
Total Protein: 6.9 g/dL (ref 6.0–8.3)

## 2014-05-18 LAB — LDL CHOLESTEROL, DIRECT: LDL DIRECT: 87 mg/dL

## 2014-05-18 LAB — POCT INR: INR: 3.2

## 2014-05-18 NOTE — Patient Instructions (Signed)
Will obtain labs today and call you with the results (lp/bmet/hfp)  STOP PLAVIX  Your physician wants you to follow-up in: 6 months with fasting labs (lp/bmet/hfp/cbc) you will receive a reminder letter in the mail two months in advance. If you don't receive a letter, please call our office to schedule the follow-up appointment.

## 2014-05-18 NOTE — Progress Notes (Signed)
Wayne [name] Date of Birth:  08-Jul-1929 [place] [address] [place], [place]  [address] [phone]        Fax   [phone]   History of Present Illness: This pleasant 80 year old gentleman is seen for a scheduled 6 month followup office visit. He has known ischemic heart disease. He had an acute myocardial infarction in 2007 treated with PCI to the distal marginal artery. He has a history of a remote embolic stroke at the time of his non-STEMI. His been on long-term Coumadin for atrial fibrillation. Since last visit he has been doing well from a cardiac standpoint. He has not been having any chest pain or shortness of breath. He has not had any TIA symptoms. He walks daily for exercise. His last echocardiogram was 01/09/13. It showed an ejection fraction of 55-60%. There was mild to moderate aortic stenosis with peak gradient 24 and mean gradient of 15. There was mild mitral regurgitation and there was biatrial enlargement.   Current Outpatient Prescriptions  Medication Sig Dispense Refill  . aspirin 81 MG tablet Take 81 mg by mouth. One tablet every other day     . digoxin (LANOXIN) 0.125 MG tablet TAKE 1 TABLET (0.125 MG TOTAL) BY MOUTH DAILY. 90 tablet 0  . Glucosamine-Chondroitin (GLUCOSAMINE CHONDR COMPLEX PO) Take 2 tablets by mouth daily.     Marland Kitchen guaiFENesin (MUCINEX) 600 MG 12 hr tablet Take 600 mg by mouth 2 (two) times daily as needed.     . metoprolol tartrate (LOPRESSOR) 25 MG tablet TAKE ONE TABLET IN AM, 1/2 TABLET IN PM 135 tablet 0  . Multiple Vitamins-Minerals (MULTIVITAMIN WITH MINERALS) tablet Take 1 tablet by mouth daily.      . Saw Palmetto, Serenoa repens, (SAW PALMETTO BERRY PO) Take 1 tablet by mouth daily.     Marland Kitchen triamcinolone (KENALOG) 0.1 % cream Apply topically 2 (two) times daily. As directed by P.A. Lennie Odor with Dr. Allyson Sabal     . warfarin (COUMADIN) 2.5 MG tablet TAKE AS DIRECTED BY ANTICOAGULATION CLINIC 180 tablet 1    No current facility-administered medications for this visit.    Allergies  Allergen Reactions  . Procaine Hcl   . Penicillins Rash    Patient Active Problem List   Diagnosis Date Noted  . Encounter for therapeutic drug monitoring 08/06/2013  . Cough 08/09/2011  . Chronic anticoagulation   . HTN (hypertension)   . CAD (coronary artery disease)   . Atrial fibrillation 09/13/2010  . Unspecified cerebral artery occlusion with cerebral infarction 09/13/2010    History  Smoking status  . Never Smoker   Smokeless tobacco  . Never Used    History  Alcohol Use No    Family History  Problem Relation Age of Onset  . Heart disease Mother   . Heart disease Father     Review of Systems: Constitutional: no fever chills diaphoresis or fatigue or change in weight.  Head and neck: no hearing loss, no epistaxis, no photophobia or visual disturbance. Respiratory: No cough, shortness of breath or wheezing. Cardiovascular: No chest pain peripheral edema, palpitations. Gastrointestinal: No abdominal distention, no abdominal pain, no change in bowel habits hematochezia or melena. Genitourinary: No dysuria, no frequency, no urgency, no nocturia. Musculoskeletal:No arthralgias, no back pain, no gait disturbance or myalgias. Neurological: No dizziness, no headaches, no numbness, no seizures, no syncope, no weakness, no tremors. Hematologic: No lymphadenopathy, no easy bruising. Psychiatric: No confusion, no hallucinations, no sleep disturbance.  Physical Exam: Filed Vitals:   05/18/14 0815  BP: 122/78  Pulse: 71   the general appearance reveals a well-developed well-nourished gentleman in no distress.The head and neck exam reveals pupils equal and reactive.  Extraocular movements are full.  There is no scleral icterus.  The mouth and pharynx are normal.  The neck is supple.  The carotids reveal no bruits.  The jugular venous pressure is normal.  The  thyroid is not enlarged.  There  is no lymphadenopathy.  The chest is clear to percussion and auscultation.  There are no rales or rhonchi.  Expansion of the chest is symmetrical.  The precordium is quiet.  The pulse is irregularly irregular  The first heart sound is normal.  The second heart sound is physiologically split.  There is no gallop rub or click.  There is a grade 2/6 systolic ejection murmur at the base.  There is also a holosystolic grade 2/6 murmur at apex  There is no abnormal lift or heave.  The abdomen is soft and nontender.  The bowel sounds are normal.  The liver and spleen are not enlarged.  There are no abdominal masses.  There are no abdominal bruits.  Extremities reveal good pedal pulses.  There is no phlebitis or edema.  There is no cyanosis or clubbing.  Strength is normal and symmetrical in all extremities.  There is no lateralizing weakness.  There are no sensory deficits.  The skin is warm and dry.  There is no rash.   EKG today shows atrial fibrillation with controlled ventricular rate of 71/m.  Since 06/02/13, no significant change.  Assessment / Plan:  1. permanent atrial fibrillation 2. remote stroke 3.  Ischemic heart disease with history of acute myocardial infarction 2007 with PCI to the distal marginal artery 4. benign hypertensive heart disease without heart failure 5. mild aortic stenosis and mild mitral regurgitation by echo  Plan: Continue same medication except we will stop his Plavix at this time.  He will continue on warfarin and on alternate day baby aspirin.  He has occasional blood streaking in his sputum at night.  He sleeps on his back and he snores and he sleeps with his mouth open according to his wife.  Recheck in 6 months for office visit  lipid panel hepatic function panel and basal metabolic panel and CBC

## 2014-05-18 NOTE — Addendum Note (Signed)
Addended by: Avie Echevaria on: 05/18/2014 09:22 AM   Modules accepted: Orders

## 2014-05-19 NOTE — Progress Notes (Signed)
Quick Note:  Please report to patient. The recent labs are stable. Continue same medication and careful diet. The triglycerides are high. Avoid sweets and starches. ______

## 2014-06-08 ENCOUNTER — Other Ambulatory Visit: Payer: Self-pay | Admitting: *Deleted

## 2014-06-08 MED ORDER — DIGOXIN 125 MCG PO TABS: ORAL_TABLET | ORAL | Status: DC

## 2014-06-08 MED ORDER — METOPROLOL TARTRATE 25 MG PO TABS: ORAL_TABLET | ORAL | Status: DC

## 2014-06-10 ENCOUNTER — Other Ambulatory Visit: Payer: Self-pay | Admitting: Cardiology

## 2014-06-22 ENCOUNTER — Ambulatory Visit (INDEPENDENT_AMBULATORY_CARE_PROVIDER_SITE_OTHER): Payer: Managed Care, Other (non HMO) | Admitting: Pharmacist

## 2014-06-22 DIAGNOSIS — I4891 Unspecified atrial fibrillation: Secondary | ICD-10-CM

## 2014-06-22 DIAGNOSIS — I6350 Cerebral infarction due to unspecified occlusion or stenosis of unspecified cerebral artery: Secondary | ICD-10-CM

## 2014-06-22 DIAGNOSIS — I482 Chronic atrial fibrillation, unspecified: Secondary | ICD-10-CM

## 2014-06-22 DIAGNOSIS — Z5181 Encounter for therapeutic drug level monitoring: Secondary | ICD-10-CM

## 2014-06-22 DIAGNOSIS — I639 Cerebral infarction, unspecified: Secondary | ICD-10-CM

## 2014-06-22 LAB — POCT INR: INR: 2.9

## 2014-07-06 ENCOUNTER — Telehealth: Payer: Self-pay

## 2014-07-06 MED ORDER — WARFARIN SODIUM 2.5 MG PO TABS: ORAL_TABLET | ORAL | Status: DC

## 2014-07-06 NOTE — Telephone Encounter (Signed)
Warfarin refill sent to [facility] as requested.

## 2014-08-03 ENCOUNTER — Ambulatory Visit (INDEPENDENT_AMBULATORY_CARE_PROVIDER_SITE_OTHER): Payer: Managed Care, Other (non HMO)

## 2014-08-03 DIAGNOSIS — I6350 Cerebral infarction due to unspecified occlusion or stenosis of unspecified cerebral artery: Secondary | ICD-10-CM

## 2014-08-03 DIAGNOSIS — I4891 Unspecified atrial fibrillation: Secondary | ICD-10-CM

## 2014-08-03 DIAGNOSIS — Z5181 Encounter for therapeutic drug level monitoring: Secondary | ICD-10-CM

## 2014-08-03 DIAGNOSIS — I639 Cerebral infarction, unspecified: Secondary | ICD-10-CM

## 2014-08-03 LAB — POCT INR: INR: 2.1

## 2014-09-14 ENCOUNTER — Ambulatory Visit (INDEPENDENT_AMBULATORY_CARE_PROVIDER_SITE_OTHER): Payer: Medicare HMO | Admitting: *Deleted

## 2014-09-14 DIAGNOSIS — I4891 Unspecified atrial fibrillation: Secondary | ICD-10-CM | POA: Diagnosis not present

## 2014-09-14 DIAGNOSIS — I6350 Cerebral infarction due to unspecified occlusion or stenosis of unspecified cerebral artery: Secondary | ICD-10-CM

## 2014-09-14 DIAGNOSIS — I639 Cerebral infarction, unspecified: Secondary | ICD-10-CM

## 2014-09-14 DIAGNOSIS — Z5181 Encounter for therapeutic drug level monitoring: Secondary | ICD-10-CM

## 2014-09-14 LAB — POCT INR: INR: 2.2

## 2014-10-28 ENCOUNTER — Ambulatory Visit (INDEPENDENT_AMBULATORY_CARE_PROVIDER_SITE_OTHER): Payer: Medicare HMO | Admitting: *Deleted

## 2014-10-28 DIAGNOSIS — Z5181 Encounter for therapeutic drug level monitoring: Secondary | ICD-10-CM | POA: Diagnosis not present

## 2014-10-28 DIAGNOSIS — I6350 Cerebral infarction due to unspecified occlusion or stenosis of unspecified cerebral artery: Secondary | ICD-10-CM

## 2014-10-28 DIAGNOSIS — I639 Cerebral infarction, unspecified: Secondary | ICD-10-CM | POA: Diagnosis not present

## 2014-10-28 DIAGNOSIS — I4891 Unspecified atrial fibrillation: Secondary | ICD-10-CM

## 2014-10-28 LAB — POCT INR: INR: 2.3

## 2014-11-10 ENCOUNTER — Telehealth: Payer: Self-pay | Admitting: Cardiology

## 2014-11-10 NOTE — Telephone Encounter (Signed)
Will forward to  Dr. Brackbill for review 

## 2014-11-10 NOTE — Telephone Encounter (Signed)
Leave off Coumadin for 2 days prior to dental extraction.  He does not need SBE prophylaxis.

## 2014-11-10 NOTE — Telephone Encounter (Signed)
New Message   1. What dental office are you calling from? Dr. Daneen Schick   2. What is your office phone and fax number? [phone]/ [phone]  3. What type of procedure is the patient having performed? Needs to have a tooth pulled   4. What date is procedure scheduled? Unsure until they know more   5. What is your question (ex. Antibiotics prior to procedure, holding medication-we need to know how long dentist wants pt to hold med)? Need to know how long he has to be off coumadin before they can pull the toot.

## 2014-11-11 NOTE — Telephone Encounter (Signed)
Faxed to Dr Kathaleen Grinder office

## 2014-11-18 ENCOUNTER — Ambulatory Visit: Payer: Managed Care, Other (non HMO) | Admitting: Cardiology

## 2014-11-18 ENCOUNTER — Other Ambulatory Visit: Payer: Managed Care, Other (non HMO)

## 2014-11-19 ENCOUNTER — Other Ambulatory Visit: Payer: Managed Care, Other (non HMO)

## 2014-11-19 ENCOUNTER — Ambulatory Visit: Payer: Managed Care, Other (non HMO) | Admitting: Cardiology

## 2014-12-14 ENCOUNTER — Other Ambulatory Visit: Payer: Self-pay | Admitting: Cardiology

## 2014-12-16 ENCOUNTER — Encounter: Payer: Self-pay | Admitting: Cardiology

## 2014-12-16 ENCOUNTER — Ambulatory Visit (INDEPENDENT_AMBULATORY_CARE_PROVIDER_SITE_OTHER): Payer: Medicare HMO | Admitting: Cardiology

## 2014-12-16 ENCOUNTER — Other Ambulatory Visit: Payer: Medicare HMO

## 2014-12-16 ENCOUNTER — Ambulatory Visit (INDEPENDENT_AMBULATORY_CARE_PROVIDER_SITE_OTHER): Payer: Medicare HMO | Admitting: *Deleted

## 2014-12-16 VITALS — BP 128/70 | HR 67 | Ht 71.0 in | Wt 200.0 lb

## 2014-12-16 DIAGNOSIS — I2510 Atherosclerotic heart disease of native coronary artery without angina pectoris: Secondary | ICD-10-CM | POA: Diagnosis not present

## 2014-12-16 DIAGNOSIS — I4891 Unspecified atrial fibrillation: Secondary | ICD-10-CM | POA: Diagnosis not present

## 2014-12-16 DIAGNOSIS — Z5181 Encounter for therapeutic drug level monitoring: Secondary | ICD-10-CM

## 2014-12-16 DIAGNOSIS — I2583 Coronary atherosclerosis due to lipid rich plaque: Secondary | ICD-10-CM

## 2014-12-16 DIAGNOSIS — I639 Cerebral infarction, unspecified: Secondary | ICD-10-CM | POA: Diagnosis not present

## 2014-12-16 DIAGNOSIS — I350 Nonrheumatic aortic (valve) stenosis: Secondary | ICD-10-CM

## 2014-12-16 DIAGNOSIS — I482 Chronic atrial fibrillation, unspecified: Secondary | ICD-10-CM

## 2014-12-16 DIAGNOSIS — I6350 Cerebral infarction due to unspecified occlusion or stenosis of unspecified cerebral artery: Secondary | ICD-10-CM

## 2014-12-16 LAB — POCT INR: INR: 2.2

## 2014-12-16 MED ORDER — METOPROLOL TARTRATE 25 MG PO TABS: ORAL_TABLET | ORAL | Status: DC

## 2014-12-16 MED ORDER — DIGOXIN 125 MCG PO TABS: 125.0000 ug | ORAL_TABLET | Freq: Every day | ORAL | Status: DC

## 2014-12-16 NOTE — Patient Instructions (Signed)
Medication Instructions:  Your physician recommends that you continue on your current medications as directed. Please refer to the Current Medication list given to you today.  Labwork: none  Testing/Procedures: none  Follow-Up: Your physician wants you to follow-up in: 6 month ov/ekg You will receive a reminder letter in the mail two months in advance. If you don't receive a letter, please call our office to schedule the follow-up appointment.   Any Other Special Instructions Will Be Listed Below (If Applicable).   

## 2014-12-16 NOTE — Progress Notes (Signed)
Cardiology Office Note   Date:  12/16/2014   ID:  Alto Gandolfo, DOB [birth year], MRN [id number]  PCP:  Leonard Downing, MD  Cardiologist: Darlin Coco MD  No chief complaint on file.     History of Present Illness: Wayne [name] is a 81 y.o. male who presents for scheduled visit.  This pleasant 81 year old gentleman is seen for a scheduled 6 month followup office visit. He has known ischemic heart disease. He had an acute myocardial infarction in 2007 treated with PCI to the distal marginal artery. He has a history of a remote embolic stroke at the time of his non-STEMI. His been on long-term Coumadin for atrial fibrillation. Since last visit he has been doing well from a cardiac standpoint. He has not been having any chest pain or shortness of breath. He has not had any TIA symptoms. He walks daily for exercise. His last echocardiogram was 01/09/13. It showed an ejection fraction of 55-60%. There was mild to moderate aortic stenosis with peak gradient 24 and mean gradient of 15. There was mild mitral regurgitation and there was biatrial enlargement. Since last visit he's had no new cardiac symptoms.  Denies chest pain or shortness of breath.  Denies dizziness syncope or palpitations.  He has some problems with insomnia and we will try Tylenol at bedtime.  Past Medical History  Diagnosis Date  . Atrial fibrillation     Chronic  . Chronic anticoagulation   . Stroke, embolic   . Old MI (myocardial infarction) 2007    s/p PCI to distal OM (no stent)  . HTN (hypertension)   . BPH (benign prostatic hyperplasia)   . CAD (coronary artery disease)     Past Surgical History  Procedure Laterality Date  . Coronary angioplasty  2007    Distal OM  . Prostate biopsy       Current Outpatient Prescriptions  Medication Sig Dispense Refill  . aspirin 81 MG tablet Take 81 mg by mouth. One tablet every other day     . digoxin (LANOXIN) 0.125 MG tablet Take 1 tablet  (125 mcg total) by mouth daily. 90 tablet 3  . Glucosamine-Chondroitin (GLUCOSAMINE CHONDR COMPLEX PO) Take 2 tablets by mouth daily.     Marland Kitchen guaiFENesin (MUCINEX) 600 MG 12 hr tablet Take 600 mg by mouth 2 (two) times daily as needed.     . metoprolol tartrate (LOPRESSOR) 25 MG tablet TAKE ONE TABLET BY MOUTH IN THE MORNING, THEN TAKE ONE-HALF IN THE EVENING 135 tablet 3  . Multiple Vitamins-Minerals (MULTIVITAMIN WITH MINERALS) tablet Take 1 tablet by mouth daily.      . Saw Palmetto, Serenoa repens, (SAW PALMETTO BERRY PO) Take 1 tablet by mouth daily.     Marland Kitchen triamcinolone (KENALOG) 0.1 % cream Apply topically 2 (two) times daily. As directed by P.A. Lennie Odor with Dr. Allyson Sabal     . warfarin (COUMADIN) 2.5 MG tablet TAKE AS DIRECTED BY ANTICOAGULATION CLINIC 180 tablet 1   No current facility-administered medications for this visit.    Allergies:   Procaine hcl and Penicillins    Social History:  The patient  reports that he has never smoked. He has never used smokeless tobacco. He reports that he does not drink alcohol or use illicit drugs.   Family History:  The patient's family history includes Heart attack in his brother, father, mother, and sister; Heart disease in his father and mother; Stroke in his brother.    ROS:  Please  see the history of present illness.   Otherwise, review of systems are positive for none.   All other systems are reviewed and negative.    PHYSICAL EXAM: VS:  BP 128/70 mmHg  Pulse 67  Ht 5\' 11"  (1.803 m)  Wt 200 lb (90.719 kg)  BMI 27.91 kg/m2 , BMI Body mass index is 27.91 kg/(m^2). GEN: Well nourished, well developed, in no acute distress HEENT: normal Neck: no JVD, carotid bruits, or masses Cardiac: Pulse is irregularly irregular in atrial fibrillation.  There is a grade 2/6 systolic murmur at the aortic area.  No diastolic murmur.  There is grade 2/6 holosystolic apical murmur of mitral regurgitation.  No peripheral edema Respiratory:  clear to  auscultation bilaterally, normal work of breathing GI: soft, nontender, nondistended, + BS MS: no deformity or atrophy Skin: warm and dry, no rash Neuro:  Strength and sensation are intact Psych: euthymic mood, full affect   EKG:  EKG is not ordered today.    Recent Labs: 05/18/2014: ALT 15; BUN 16; Creatinine, Ser 1.0; Potassium 4.4; Sodium 134*    Lipid Panel    Component Value Date/Time   CHOL 220* 05/18/2014 0922   TRIG 520.0* 05/18/2014 0922   HDL 26.30* 05/18/2014 0922   CHOLHDL 8 05/18/2014 0922   VLDL 104.0* 05/18/2014 0922   LDLDIRECT 87.0 05/18/2014 0922      Wt Readings from Last 3 Encounters:  12/16/14 200 lb (90.719 kg)  10/15/13 196 lb (88.905 kg)  06/02/13 198 lb (89.812 kg)         ASSESSMENT AND PLAN:           1. permanent atrial fibrillation 2. remote stroke 3. Ischemic heart disease with history of acute myocardial infarction 2007 with PCI to the distal marginal artery 4. benign hypertensive heart disease without heart failure 5. mild aortic stenosis and mild mitral regurgitation by echo   Current medicines are reviewed at length with the patient today.  The patient does not have concerns regarding medicines.  The following changes have been made:  no change  Labs/ tests ordered today include:  No orders of the defined types were placed in this encounter.      Berna Spare MD 12/16/2014 6:49 PM    [address] [place], [place], [place]  [address] Phone: [phone]; Fax: [phone]

## 2014-12-16 NOTE — Telephone Encounter (Signed)
Per note 11.30.15

## 2015-01-27 ENCOUNTER — Ambulatory Visit (INDEPENDENT_AMBULATORY_CARE_PROVIDER_SITE_OTHER): Payer: Medicare HMO | Admitting: *Deleted

## 2015-01-27 DIAGNOSIS — I4891 Unspecified atrial fibrillation: Secondary | ICD-10-CM

## 2015-01-27 DIAGNOSIS — Z5181 Encounter for therapeutic drug level monitoring: Secondary | ICD-10-CM

## 2015-01-27 DIAGNOSIS — I482 Chronic atrial fibrillation, unspecified: Secondary | ICD-10-CM

## 2015-01-27 DIAGNOSIS — I639 Cerebral infarction, unspecified: Secondary | ICD-10-CM

## 2015-01-27 DIAGNOSIS — I6350 Cerebral infarction due to unspecified occlusion or stenosis of unspecified cerebral artery: Secondary | ICD-10-CM

## 2015-01-27 LAB — POCT INR: INR: 2.6

## 2015-02-08 ENCOUNTER — Other Ambulatory Visit: Payer: Self-pay | Admitting: Cardiology

## 2015-03-10 ENCOUNTER — Ambulatory Visit (INDEPENDENT_AMBULATORY_CARE_PROVIDER_SITE_OTHER): Payer: Medicare HMO | Admitting: Pharmacist

## 2015-03-10 DIAGNOSIS — Z5181 Encounter for therapeutic drug level monitoring: Secondary | ICD-10-CM

## 2015-03-10 DIAGNOSIS — I639 Cerebral infarction, unspecified: Secondary | ICD-10-CM | POA: Diagnosis not present

## 2015-03-10 DIAGNOSIS — I482 Chronic atrial fibrillation, unspecified: Secondary | ICD-10-CM

## 2015-03-10 DIAGNOSIS — I6350 Cerebral infarction due to unspecified occlusion or stenosis of unspecified cerebral artery: Secondary | ICD-10-CM

## 2015-03-10 DIAGNOSIS — I4891 Unspecified atrial fibrillation: Secondary | ICD-10-CM

## 2015-03-10 LAB — POCT INR: INR: 2.5

## 2015-03-12 ENCOUNTER — Other Ambulatory Visit: Payer: Self-pay | Admitting: Cardiology

## 2015-03-13 ENCOUNTER — Other Ambulatory Visit: Payer: Self-pay | Admitting: Cardiology

## 2015-04-21 ENCOUNTER — Ambulatory Visit (INDEPENDENT_AMBULATORY_CARE_PROVIDER_SITE_OTHER): Payer: Medicare HMO | Admitting: *Deleted

## 2015-04-21 DIAGNOSIS — I482 Chronic atrial fibrillation, unspecified: Secondary | ICD-10-CM

## 2015-04-21 DIAGNOSIS — I4891 Unspecified atrial fibrillation: Secondary | ICD-10-CM

## 2015-04-21 DIAGNOSIS — Z5181 Encounter for therapeutic drug level monitoring: Secondary | ICD-10-CM | POA: Diagnosis not present

## 2015-04-21 DIAGNOSIS — I6350 Cerebral infarction due to unspecified occlusion or stenosis of unspecified cerebral artery: Secondary | ICD-10-CM | POA: Diagnosis not present

## 2015-04-21 LAB — POCT INR: INR: 2.7

## 2015-06-02 ENCOUNTER — Ambulatory Visit (INDEPENDENT_AMBULATORY_CARE_PROVIDER_SITE_OTHER): Payer: Medicare HMO | Admitting: *Deleted

## 2015-06-02 DIAGNOSIS — I482 Chronic atrial fibrillation, unspecified: Secondary | ICD-10-CM

## 2015-06-02 DIAGNOSIS — Z5181 Encounter for therapeutic drug level monitoring: Secondary | ICD-10-CM | POA: Diagnosis not present

## 2015-06-02 DIAGNOSIS — I6350 Cerebral infarction due to unspecified occlusion or stenosis of unspecified cerebral artery: Secondary | ICD-10-CM

## 2015-06-02 DIAGNOSIS — I4891 Unspecified atrial fibrillation: Secondary | ICD-10-CM

## 2015-06-02 LAB — POCT INR: INR: 2.7

## 2015-06-10 DIAGNOSIS — D2311 Other benign neoplasm of skin of right eyelid, including canthus: Secondary | ICD-10-CM | POA: Diagnosis not present

## 2015-06-10 DIAGNOSIS — H02403 Unspecified ptosis of bilateral eyelids: Secondary | ICD-10-CM | POA: Diagnosis not present

## 2015-06-10 DIAGNOSIS — H04123 Dry eye syndrome of bilateral lacrimal glands: Secondary | ICD-10-CM | POA: Diagnosis not present

## 2015-06-10 DIAGNOSIS — H25813 Combined forms of age-related cataract, bilateral: Secondary | ICD-10-CM | POA: Diagnosis not present

## 2015-06-10 DIAGNOSIS — H35373 Puckering of macula, bilateral: Secondary | ICD-10-CM | POA: Diagnosis not present

## 2015-07-14 ENCOUNTER — Ambulatory Visit (INDEPENDENT_AMBULATORY_CARE_PROVIDER_SITE_OTHER): Payer: Medicare HMO | Admitting: *Deleted

## 2015-07-14 DIAGNOSIS — I6350 Cerebral infarction due to unspecified occlusion or stenosis of unspecified cerebral artery: Secondary | ICD-10-CM

## 2015-07-14 DIAGNOSIS — I4891 Unspecified atrial fibrillation: Secondary | ICD-10-CM

## 2015-07-14 DIAGNOSIS — Z5181 Encounter for therapeutic drug level monitoring: Secondary | ICD-10-CM

## 2015-07-14 DIAGNOSIS — I482 Chronic atrial fibrillation, unspecified: Secondary | ICD-10-CM

## 2015-07-14 LAB — POCT INR: INR: 2

## 2015-07-21 DIAGNOSIS — 419620001 Death: Secondary | SNOMED CT | POA: Diagnosis not present

## 2015-07-21 DEATH — deceased

## 2015-07-29 ENCOUNTER — Ambulatory Visit (INDEPENDENT_AMBULATORY_CARE_PROVIDER_SITE_OTHER): Payer: Medicare HMO

## 2015-07-29 ENCOUNTER — Encounter: Payer: Self-pay | Admitting: Cardiology

## 2015-07-29 ENCOUNTER — Ambulatory Visit (INDEPENDENT_AMBULATORY_CARE_PROVIDER_SITE_OTHER): Payer: Medicare HMO | Admitting: Cardiology

## 2015-07-29 VITALS — BP 126/70 | HR 74 | Ht 70.5 in | Wt 200.0 lb

## 2015-07-29 DIAGNOSIS — I6350 Cerebral infarction due to unspecified occlusion or stenosis of unspecified cerebral artery: Secondary | ICD-10-CM | POA: Diagnosis not present

## 2015-07-29 DIAGNOSIS — I482 Chronic atrial fibrillation, unspecified: Secondary | ICD-10-CM

## 2015-07-29 DIAGNOSIS — I2583 Coronary atherosclerosis due to lipid rich plaque: Secondary | ICD-10-CM

## 2015-07-29 DIAGNOSIS — I4891 Unspecified atrial fibrillation: Secondary | ICD-10-CM

## 2015-07-29 DIAGNOSIS — I10 Essential (primary) hypertension: Secondary | ICD-10-CM | POA: Diagnosis not present

## 2015-07-29 DIAGNOSIS — Z5181 Encounter for therapeutic drug level monitoring: Secondary | ICD-10-CM

## 2015-07-29 DIAGNOSIS — I2510 Atherosclerotic heart disease of native coronary artery without angina pectoris: Secondary | ICD-10-CM | POA: Diagnosis not present

## 2015-07-29 LAB — POCT INR: INR: 1.4

## 2015-07-29 NOTE — Progress Notes (Signed)
Cardiology Office Note   Date:  07/29/2015   ID:  Wayne [name], DOB 10-20-1929, MRN [id number]  PCP:  Leonard Downing, MD  Cardiologist: Darlin Coco MD  No chief complaint on file.     History of Present Illness: Wayne [name] is a 82 y.o. male who presents for 6 month follow-up visit   He has known ischemic heart disease. He had an acute myocardial infarction in 2007 treated with PCI to the distal marginal artery. He has a history of a remote embolic stroke at the time of his non-STEMI. He has been on long-term Coumadin for atrial fibrillation. Since last visit he has been doing well from a cardiac standpoint. He has not been having any chest pain or shortness of breath. He has not had any TIA symptoms. He walks daily for exercise. His last echocardiogram was 01/09/13. It showed an ejection fraction of 55-60%. There was mild to moderate aortic stenosis with peak gradient 24 and mean gradient of 15. There was mild mitral regurgitation and there was biatrial enlargement. Since last visit he's had no new cardiac symptoms. Denies chest pain or shortness of breath. Denies dizziness syncope or palpitations. He has some problems with insomnia and we will try Tylenol at bedtime. He states that sometimes during the night he seems to accumulate some old blood in the back of his throat.  When this happens he sometimes leaves off his extra Coumadin.  Today his INR was only 1.4 because he had left off some of his Coumadin.  We talked again about maintaining his INR above 2.0 in an effort to diminish the risk of a stroke from his atrial fibrillation.   Past Medical History  Diagnosis Date  . Atrial fibrillation (HCC)     Chronic  . Chronic anticoagulation   . Stroke, embolic ([place])   . Old MI (myocardial infarction) 2007    s/p PCI to distal OM (no stent)  . HTN (hypertension)   . BPH (benign prostatic hyperplasia)   . CAD (coronary artery disease)     Past  Surgical History  Procedure Laterality Date  . Coronary angioplasty  2007    Distal OM  . Prostate biopsy       Current Outpatient Prescriptions  Medication Sig Dispense Refill  . aspirin 81 MG tablet Take 81 mg by mouth. One tablet every other day     . digoxin (LANOXIN) 0.125 MG tablet Take 1 tablet (125 mcg total) by mouth daily. 90 tablet 3  . Glucosamine-Chondroitin (GLUCOSAMINE CHONDR COMPLEX PO) Take 2 tablets by mouth daily.     Marland Kitchen guaiFENesin (MUCINEX) 600 MG 12 hr tablet Take 600 mg by mouth 2 (two) times daily as needed for cough or to loosen phlegm.     . metoprolol tartrate (LOPRESSOR) 25 MG tablet TAKE ONE TABLET BY MOUTH IN THE MORNING AND ONE-HALF IN THE EVENING 135 tablet 3  . Multiple Vitamins-Minerals (MULTIVITAMIN WITH MINERALS) tablet Take 1 tablet by mouth daily.      . Saw Palmetto, Serenoa repens, (SAW PALMETTO BERRY PO) Take 1 tablet by mouth daily.     Marland Kitchen triamcinolone (KENALOG) 0.1 % cream Apply topically 2 (two) times daily. As directed by P.A. Lennie Odor with Dr. Allyson Sabal     . warfarin (COUMADIN) 2.5 MG tablet TAKE AS DIRECTED BY ANTICOAGULATION CLINIC 180 tablet 1   No current facility-administered medications for this visit.    Allergies:   Procaine hcl and Penicillins  Social History:  The patient  reports that he has never smoked. He has never used smokeless tobacco. He reports that he does not drink alcohol or use illicit drugs.   Family History:  The patient's family history includes Heart attack in his brother, father, mother, and sister; Heart disease in his father and mother; Stroke in his brother.    ROS:  Please see the history of present illness.   Otherwise, review of systems are positive for none.   All other systems are reviewed and negative.    PHYSICAL EXAM: VS:  BP 126/70 mmHg  Pulse 74  Ht 5' 10.5" (1.791 m)  Wt 200 lb (90.719 kg)  BMI 28.28 kg/m2 , BMI Body mass index is 28.28 kg/(m^2). GEN: Well nourished, well developed, in  no acute distress HEENT: normal Neck: no JVD, carotid bruits, or masses Cardiac: Irregularly irregular; There is a grade 2/6 basilar systolic murmur.  No rubs, or gallops,no edema  Respiratory:  clear to auscultation bilaterally, normal work of breathing GI: soft, nontender, nondistended, + BS MS: no deformity or atrophy Skin: warm and dry, no rash Neuro:  Strength and sensation are intact Psych: euthymic mood, full affect   EKG:  EKG is ordered today.EKG shows atrial fibrillation with controlled ventricular response and nonspecific T-wave abnormalities.    Recent Labs: No results found for requested labs within last 365 days.    Lipid Panel    Component Value Date/Time   CHOL 220* 05/18/2014 0922   TRIG 520.0* 05/18/2014 0922   HDL 26.30* 05/18/2014 0922   CHOLHDL 8 05/18/2014 0922   VLDL 104.0* 05/18/2014 0922   LDLDIRECT 87.0 05/18/2014 0922      Wt Readings from Last 3 Encounters:  07/29/15 200 lb (90.719 kg)  12/16/14 200 lb (90.719 kg)  10/15/13 196 lb (88.905 kg)        ASSESSMENT AND PLAN:  1. permanent atrial fibrillation.Continue rate control and anticoagulation. 2. remote stroke.Continue anticoagulation with warfarin.IContinue baby aspirin Every other day. 3. Ischemic heart disease with history of acute myocardial infarction 2007 with PCI to the distal marginal artery 4. benign hypertensive heart disease without heart failure 5. mild aortic stenosis and mild mitral regurgitation by echo   Current medicines are reviewed at length with the patient today.  The patient does not have concerns regarding medicines.  The following changes have been made:  no change  Labs/ tests ordered today include:  No orders of the defined types were placed in this encounter.     Disposition:   Continue current medication.  Return in 6 months for follow-up office visit with Dr. Stanford Breed Signed, Darlin Coco MD 07/29/2015 5:35 PM    [address], [place], [place]  [address] Phone: [phone]; Fax: [phone]

## 2015-07-29 NOTE — Patient Instructions (Signed)
Medication Instructions:  Your physician recommends that you continue on your current medications as directed. Please refer to the Current Medication list given to you today.  Labwork: NONE  Testing/Procedures: NONE  Follow-Up: Your physician wants you to follow-up in: [place] will receive a reminder letter in the mail two months in advance. If you don't receive a letter, please call our office to schedule the follow-up appointment.  If you need a refill on your cardiac medications before your next appointment, please call your pharmacy.

## 2015-08-02 ENCOUNTER — Other Ambulatory Visit: Payer: Self-pay | Admitting: *Deleted

## 2015-08-02 MED ORDER — WARFARIN SODIUM 2.5 MG PO TABS: ORAL_TABLET | ORAL | Status: DC

## 2015-08-05 ENCOUNTER — Ambulatory Visit (INDEPENDENT_AMBULATORY_CARE_PROVIDER_SITE_OTHER): Payer: Medicare HMO

## 2015-08-05 DIAGNOSIS — I6350 Cerebral infarction due to unspecified occlusion or stenosis of unspecified cerebral artery: Secondary | ICD-10-CM | POA: Diagnosis not present

## 2015-08-05 DIAGNOSIS — Z5181 Encounter for therapeutic drug level monitoring: Secondary | ICD-10-CM

## 2015-08-05 DIAGNOSIS — I4891 Unspecified atrial fibrillation: Secondary | ICD-10-CM | POA: Diagnosis not present

## 2015-08-05 DIAGNOSIS — I482 Chronic atrial fibrillation, unspecified: Secondary | ICD-10-CM

## 2015-08-05 LAB — POCT INR: INR: 2.1

## 2015-08-16 ENCOUNTER — Telehealth: Payer: Self-pay | Admitting: Cardiology

## 2015-08-16 NOTE — Telephone Encounter (Signed)
Spoke with wife and will mail completed form

## 2015-08-16 NOTE — Telephone Encounter (Signed)
New message     Calling to check the status on the handicapp parking permit that was dropped off last week

## 2015-08-30 DIAGNOSIS — H2512 Age-related nuclear cataract, left eye: Secondary | ICD-10-CM | POA: Diagnosis not present

## 2015-08-30 DIAGNOSIS — H25812 Combined forms of age-related cataract, left eye: Secondary | ICD-10-CM | POA: Diagnosis not present

## 2015-09-02 ENCOUNTER — Ambulatory Visit (INDEPENDENT_AMBULATORY_CARE_PROVIDER_SITE_OTHER): Payer: Medicare HMO | Admitting: Pharmacist

## 2015-09-02 DIAGNOSIS — Z5181 Encounter for therapeutic drug level monitoring: Secondary | ICD-10-CM | POA: Diagnosis not present

## 2015-09-02 DIAGNOSIS — I482 Chronic atrial fibrillation, unspecified: Secondary | ICD-10-CM

## 2015-09-02 DIAGNOSIS — I6350 Cerebral infarction due to unspecified occlusion or stenosis of unspecified cerebral artery: Secondary | ICD-10-CM | POA: Diagnosis not present

## 2015-09-02 DIAGNOSIS — I4891 Unspecified atrial fibrillation: Secondary | ICD-10-CM | POA: Diagnosis not present

## 2015-09-02 LAB — POCT INR: INR: 2.1

## 2015-09-30 ENCOUNTER — Ambulatory Visit (INDEPENDENT_AMBULATORY_CARE_PROVIDER_SITE_OTHER): Payer: Medicare HMO | Admitting: *Deleted

## 2015-09-30 DIAGNOSIS — I482 Chronic atrial fibrillation, unspecified: Secondary | ICD-10-CM

## 2015-09-30 DIAGNOSIS — I6350 Cerebral infarction due to unspecified occlusion or stenosis of unspecified cerebral artery: Secondary | ICD-10-CM | POA: Diagnosis not present

## 2015-09-30 DIAGNOSIS — I4891 Unspecified atrial fibrillation: Secondary | ICD-10-CM | POA: Diagnosis not present

## 2015-09-30 DIAGNOSIS — Z5181 Encounter for therapeutic drug level monitoring: Secondary | ICD-10-CM | POA: Diagnosis not present

## 2015-09-30 LAB — POCT INR: INR: 2.5

## 2015-10-04 DIAGNOSIS — Z961 Presence of intraocular lens: Secondary | ICD-10-CM | POA: Diagnosis not present

## 2015-10-22 DIAGNOSIS — Z0100 Encounter for examination of eyes and vision without abnormal findings: Secondary | ICD-10-CM | POA: Diagnosis not present

## 2015-11-04 ENCOUNTER — Ambulatory Visit (INDEPENDENT_AMBULATORY_CARE_PROVIDER_SITE_OTHER): Payer: Medicare HMO | Admitting: *Deleted

## 2015-11-04 DIAGNOSIS — Z5181 Encounter for therapeutic drug level monitoring: Secondary | ICD-10-CM | POA: Diagnosis not present

## 2015-11-04 DIAGNOSIS — I6350 Cerebral infarction due to unspecified occlusion or stenosis of unspecified cerebral artery: Secondary | ICD-10-CM | POA: Diagnosis not present

## 2015-11-04 DIAGNOSIS — I482 Chronic atrial fibrillation, unspecified: Secondary | ICD-10-CM

## 2015-11-04 DIAGNOSIS — I4891 Unspecified atrial fibrillation: Secondary | ICD-10-CM

## 2015-11-04 LAB — POCT INR: INR: 2.2

## 2015-12-16 ENCOUNTER — Ambulatory Visit (INDEPENDENT_AMBULATORY_CARE_PROVIDER_SITE_OTHER): Payer: Medicare HMO | Admitting: *Deleted

## 2015-12-16 DIAGNOSIS — I4891 Unspecified atrial fibrillation: Secondary | ICD-10-CM | POA: Diagnosis not present

## 2015-12-16 DIAGNOSIS — I6350 Cerebral infarction due to unspecified occlusion or stenosis of unspecified cerebral artery: Secondary | ICD-10-CM

## 2015-12-16 DIAGNOSIS — I482 Chronic atrial fibrillation, unspecified: Secondary | ICD-10-CM

## 2015-12-16 DIAGNOSIS — Z5181 Encounter for therapeutic drug level monitoring: Secondary | ICD-10-CM

## 2015-12-16 LAB — POCT INR: INR: 1.6

## 2015-12-28 DIAGNOSIS — R69 Illness, unspecified: Secondary | ICD-10-CM | POA: Diagnosis not present

## 2015-12-30 ENCOUNTER — Ambulatory Visit (INDEPENDENT_AMBULATORY_CARE_PROVIDER_SITE_OTHER): Payer: Medicare HMO | Admitting: Pharmacist

## 2015-12-30 DIAGNOSIS — I482 Chronic atrial fibrillation, unspecified: Secondary | ICD-10-CM

## 2015-12-30 DIAGNOSIS — I6350 Cerebral infarction due to unspecified occlusion or stenosis of unspecified cerebral artery: Secondary | ICD-10-CM

## 2015-12-30 DIAGNOSIS — I4891 Unspecified atrial fibrillation: Secondary | ICD-10-CM | POA: Diagnosis not present

## 2015-12-30 DIAGNOSIS — Z5181 Encounter for therapeutic drug level monitoring: Secondary | ICD-10-CM

## 2015-12-30 LAB — POCT INR: INR: 1.9

## 2016-01-10 ENCOUNTER — Encounter: Payer: Self-pay | Admitting: Cardiology

## 2016-01-19 NOTE — Progress Notes (Signed)
HPI: FU atrial fibrillation and CAD; previously followed by Dr Mare Ferrari. Cardiac catheterization 2007 showed ejection fraction 50-55%, 100% mid to distal obtuse marginal. Patient had PCI of the OM at that time. 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 Last echo 7/14 showed normal LV systolic function, grade 1 diastolic dysfunction; mild to moderate AS with mean gradient of 15 mmHg, mild MR and mild biatrial enlargement. Since last seen, Patient denies dyspnea, Chest pain, palpitations, syncope or bleeding.   Current Outpatient Prescriptions  Medication Sig Dispense Refill  . aspirin 81 MG tablet Take 81 mg by mouth. One tablet every other day     . digoxin (LANOXIN) 0.125 MG tablet Take 1 tablet (125 mcg total) by mouth daily. 90 tablet 3  . Glucosamine-Chondroitin (GLUCOSAMINE CHONDR COMPLEX PO) Take 2 tablets by mouth daily.     Marland Kitchen guaiFENesin (MUCINEX) 600 MG 12 hr tablet Take 600 mg by mouth 2 (two) times daily as needed for cough or to loosen phlegm.     . metoprolol tartrate (LOPRESSOR) 25 MG tablet TAKE ONE TABLET BY MOUTH IN THE MORNING AND ONE-HALF IN THE EVENING 135 tablet 3  . Multiple Vitamins-Minerals (MULTIVITAMIN WITH MINERALS) tablet Take 1 tablet by mouth daily.      . Saw Palmetto, Serenoa repens, (SAW PALMETTO BERRY PO) Take 1 tablet by mouth daily.     Marland Kitchen triamcinolone (KENALOG) 0.1 % cream Apply topically 2 (two) times daily. As directed by P.A. Lennie Odor with Dr. Allyson Sabal     . warfarin (COUMADIN) 2.5 MG tablet TAKE AS DIRECTED BY ANTICOAGULATION CLINIC 180 tablet 1   No current facility-administered medications for this visit.      Past Medical History:  Diagnosis Date  . Atrial fibrillation (HCC)    Chronic  . BPH (benign prostatic hyperplasia)   . CAD (coronary artery disease)   . Chronic anticoagulation   . HTN (hypertension)   . Old MI (myocardial infarction) 2007   s/p PCI to distal OM (no stent)  . Stroke,  embolic [facility])     Past Surgical History:  Procedure Laterality Date  . CORONARY ANGIOPLASTY  2007   Distal OM  . PROSTATE BIOPSY      Social History   Social History  . Marital status: Married    Spouse name: N/A  . Number of children: N/A  . Years of education: N/A   Occupational History  . Not on file.   Social History Main Topics  . Smoking status: Never Smoker  . Smokeless tobacco: Never Used  . Alcohol use No  . Drug use: No  . Sexual activity: Not on file   Other Topics Concern  . Not on file   Social History Narrative  . No narrative on file    Family History  Problem Relation Age of Onset  . Heart disease Mother   . Heart attack Mother   . Heart disease Father   . Heart attack Father   . Heart attack Brother   . Heart attack Sister   . Stroke Brother     ROS: no fevers or chills, productive cough, hemoptysis, dysphasia, odynophagia, melena, hematochezia, dysuria, hematuria, rash, seizure activity, orthopnea, PND, pedal edema, claudication. Remaining systems are negative.  Physical Exam: Well-developed well-nourished in no acute distress.  Skin is warm and dry.  HEENT is normal.  Neck is supple.  Chest is clear to auscultation with normal expansion.  Cardiovascular  exam is irregular,  3/6 systolic murmur left sternal border.S2 is not diminished. Abdominal exam nontender or distended. No masses palpated. Extremities show no edema. neuro grossly intact  ECG Atrial fibrillation at a rate of 59. Normal axis. Nonspecific ST changes. Cannot rule out prior anterior infarct versus lead reversal.  Assessment and plan  1. Permanent atrial fibrillation-Approximately 20 minutes spent prior to clinic reviewing previous records. Continue metoprolol for rate control. Continue Coumadin. Discontinue aspirin. I have provided the names of DOACS. He will contact his insurance company concerning cost and we will change if he is agreeable. Check hemoglobin.  2  History of mild to moderate aortic stenosis-repeat echocardiogram.  3 coronary artery disease-discontinue aspirin given need for Coumadin. Check lipids. Patient declines statins.  4 hypertension-blood pressure controlled. Continue present medications. Check potassium and renal function.  5 history of prior CVA  Kirk Ruths, MD

## 2016-01-27 ENCOUNTER — Encounter (INDEPENDENT_AMBULATORY_CARE_PROVIDER_SITE_OTHER): Payer: Self-pay

## 2016-01-27 ENCOUNTER — Encounter: Payer: Self-pay | Admitting: Cardiology

## 2016-01-27 ENCOUNTER — Ambulatory Visit (INDEPENDENT_AMBULATORY_CARE_PROVIDER_SITE_OTHER): Payer: Medicare HMO | Admitting: Pharmacist Clinician (PhC)/ Clinical Pharmacy Specialist

## 2016-01-27 ENCOUNTER — Ambulatory Visit (INDEPENDENT_AMBULATORY_CARE_PROVIDER_SITE_OTHER): Payer: Medicare HMO | Admitting: Cardiology

## 2016-01-27 VITALS — BP 134/76 | HR 59 | Ht 72.0 in | Wt 196.0 lb

## 2016-01-27 DIAGNOSIS — I2510 Atherosclerotic heart disease of native coronary artery without angina pectoris: Secondary | ICD-10-CM | POA: Diagnosis not present

## 2016-01-27 DIAGNOSIS — I350 Nonrheumatic aortic (valve) stenosis: Secondary | ICD-10-CM | POA: Diagnosis not present

## 2016-01-27 DIAGNOSIS — Z5181 Encounter for therapeutic drug level monitoring: Secondary | ICD-10-CM

## 2016-01-27 DIAGNOSIS — I10 Essential (primary) hypertension: Secondary | ICD-10-CM

## 2016-01-27 DIAGNOSIS — I6350 Cerebral infarction due to unspecified occlusion or stenosis of unspecified cerebral artery: Secondary | ICD-10-CM | POA: Diagnosis not present

## 2016-01-27 DIAGNOSIS — Z79899 Other long term (current) drug therapy: Secondary | ICD-10-CM | POA: Diagnosis not present

## 2016-01-27 DIAGNOSIS — I482 Chronic atrial fibrillation, unspecified: Secondary | ICD-10-CM

## 2016-01-27 DIAGNOSIS — I4891 Unspecified atrial fibrillation: Secondary | ICD-10-CM

## 2016-01-27 LAB — LIPID PANEL
CHOL/HDL RATIO: 6.1 ratio — AB (ref <=5.0)
Cholesterol: 176 mg/dL (ref 125–200)
HDL: 29 mg/dL — AB (ref >=40)
LDL Cholesterol: 92 mg/dL (ref <130)
Triglycerides: 274 mg/dL — ABNORMAL HIGH (ref <150)
VLDL: 55 mg/dL — ABNORMAL HIGH (ref <30)

## 2016-01-27 LAB — CBC
HEMATOCRIT: 46.2 % (ref 38.5–50.0)
Hemoglobin: 15.2 g/dL (ref 13.2–17.1)
MCH: 29.6 pg (ref 27.0–33.0)
MCHC: 32.9 g/dL (ref 32.0–36.0)
MCV: 89.9 fL (ref 80.0–100.0)
MPV: 11.2 fL (ref 7.5–12.5)
Platelets: 162 10*3/uL (ref 140–400)
RBC: 5.14 MIL/uL (ref 4.20–5.80)
RDW: 15.3 % — AB (ref 11.0–15.0)
WBC: 6.3 10*3/uL (ref 3.8–10.8)

## 2016-01-27 LAB — BASIC METABOLIC PANEL WITH GFR
BUN: 12 mg/dL (ref 7–25)
CHLORIDE: 103 mmol/L (ref 98–110)
CO2: 29 mmol/L (ref 20–31)
CREATININE: 1.02 mg/dL (ref 0.70–1.11)
Calcium: 9 mg/dL (ref 8.6–10.3)
GFR, Est African American: 78 mL/min (ref >=60)
GFR, Est Non African American: 67 mL/min (ref >=60)
Glucose, Bld: 102 mg/dL — ABNORMAL HIGH (ref 65–99)
Potassium: 4.4 mmol/L (ref 3.5–5.3)
SODIUM: 139 mmol/L (ref 135–146)

## 2016-01-27 LAB — POCT INR: INR: 2.4

## 2016-01-27 NOTE — Patient Instructions (Signed)
Medications  STOP Aspirin    Lab work  Your physician recommends that you return for lab work today. CBC, BMET, LIPID   Procedures  Your physician has requested that you have an echocardiogram. Echocardiography is a painless test that uses sound waves to create images of your heart. It provides your doctor with information about the size and shape of your heart and how well your heart's chambers and valves are working. This procedure takes approximately one hour. There are no restrictions for this procedure.   Follow-up   Your physician wants you to follow-up in: 6 months with Dr. Stanford Breed. You will receive a reminder letter in the mail two months in advance. If you don't receive a letter, please call our office to schedule the follow-up appointment.  If you need a refill on your cardiac medications before your next appointment, please call your pharmacy.  (Xarelto, Pradaxa, Eliquis)

## 2016-02-02 ENCOUNTER — Encounter: Payer: Self-pay | Admitting: *Deleted

## 2016-02-14 ENCOUNTER — Other Ambulatory Visit: Payer: Self-pay

## 2016-02-14 ENCOUNTER — Ambulatory Visit (HOSPITAL_COMMUNITY): Payer: Medicare HMO | Attending: Cardiology

## 2016-02-14 DIAGNOSIS — I119 Hypertensive heart disease without heart failure: Secondary | ICD-10-CM | POA: Diagnosis not present

## 2016-02-14 DIAGNOSIS — I340 Nonrheumatic mitral (valve) insufficiency: Secondary | ICD-10-CM | POA: Insufficient documentation

## 2016-02-14 DIAGNOSIS — I350 Nonrheumatic aortic (valve) stenosis: Secondary | ICD-10-CM

## 2016-02-14 DIAGNOSIS — I252 Old myocardial infarction: Secondary | ICD-10-CM | POA: Diagnosis not present

## 2016-02-14 DIAGNOSIS — R29898 Other symptoms and signs involving the musculoskeletal system: Secondary | ICD-10-CM | POA: Insufficient documentation

## 2016-02-14 DIAGNOSIS — I2510 Atherosclerotic heart disease of native coronary artery without angina pectoris: Secondary | ICD-10-CM | POA: Insufficient documentation

## 2016-02-15 ENCOUNTER — Telehealth: Payer: Self-pay | Admitting: Cardiology

## 2016-02-15 NOTE — Telephone Encounter (Signed)
Results given.

## 2016-02-15 NOTE — Telephone Encounter (Signed)
New message ° ° ° °Pt wife verbalized that she is returning call for rn °

## 2016-02-29 DIAGNOSIS — R3915 Urgency of urination: Secondary | ICD-10-CM | POA: Diagnosis not present

## 2016-02-29 DIAGNOSIS — R3912 Poor urinary stream: Secondary | ICD-10-CM | POA: Diagnosis not present

## 2016-02-29 DIAGNOSIS — N401 Enlarged prostate with lower urinary tract symptoms: Secondary | ICD-10-CM | POA: Diagnosis not present

## 2016-02-29 DIAGNOSIS — R350 Frequency of micturition: Secondary | ICD-10-CM | POA: Diagnosis not present

## 2016-03-09 ENCOUNTER — Telehealth: Payer: Self-pay | Admitting: Cardiology

## 2016-03-09 ENCOUNTER — Ambulatory Visit (INDEPENDENT_AMBULATORY_CARE_PROVIDER_SITE_OTHER): Payer: Medicare HMO | Admitting: Pharmacist

## 2016-03-09 DIAGNOSIS — I4891 Unspecified atrial fibrillation: Secondary | ICD-10-CM

## 2016-03-09 DIAGNOSIS — I6350 Cerebral infarction due to unspecified occlusion or stenosis of unspecified cerebral artery: Secondary | ICD-10-CM

## 2016-03-09 DIAGNOSIS — Z5181 Encounter for therapeutic drug level monitoring: Secondary | ICD-10-CM | POA: Diagnosis not present

## 2016-03-09 LAB — POCT INR: INR: 1.8

## 2016-03-09 MED ORDER — DIGOXIN 125 MCG PO TABS: 125.0000 ug | ORAL_TABLET | Freq: Every day | ORAL | 3 refills | Status: DC

## 2016-03-09 MED ORDER — METOPROLOL TARTRATE 25 MG PO TABS: ORAL_TABLET | ORAL | 3 refills | Status: DC

## 2016-03-09 NOTE — Telephone Encounter (Signed)
New message      *STAT* If patient is at the pharmacy, call can be transferred to refill team.   1. Which medications need to be refilled? (please list name of each medication and dose if known) metoprol 25 mg  - digoxin 0.125 mg  2. Which pharmacy/location (including street and city if local pharmacy) is medication to be sent to?[address] [facility] -[phone]   3. Do they need a 30 day or 90 day supply? 90 days

## 2016-03-14 DIAGNOSIS — D1801 Hemangioma of skin and subcutaneous tissue: Secondary | ICD-10-CM | POA: Diagnosis not present

## 2016-03-14 DIAGNOSIS — L570 Actinic keratosis: Secondary | ICD-10-CM | POA: Diagnosis not present

## 2016-03-14 DIAGNOSIS — L578 Other skin changes due to chronic exposure to nonionizing radiation: Secondary | ICD-10-CM | POA: Diagnosis not present

## 2016-03-14 DIAGNOSIS — L821 Other seborrheic keratosis: Secondary | ICD-10-CM | POA: Diagnosis not present

## 2016-03-14 DIAGNOSIS — D485 Neoplasm of uncertain behavior of skin: Secondary | ICD-10-CM | POA: Diagnosis not present

## 2016-03-14 DIAGNOSIS — D225 Melanocytic nevi of trunk: Secondary | ICD-10-CM | POA: Diagnosis not present

## 2016-03-23 DIAGNOSIS — C44519 Basal cell carcinoma of skin of other part of trunk: Secondary | ICD-10-CM | POA: Diagnosis not present

## 2016-03-23 DIAGNOSIS — D485 Neoplasm of uncertain behavior of skin: Secondary | ICD-10-CM | POA: Diagnosis not present

## 2016-03-23 DIAGNOSIS — C44311 Basal cell carcinoma of skin of nose: Secondary | ICD-10-CM | POA: Diagnosis not present

## 2016-03-23 DIAGNOSIS — L821 Other seborrheic keratosis: Secondary | ICD-10-CM | POA: Diagnosis not present

## 2016-03-23 DIAGNOSIS — L570 Actinic keratosis: Secondary | ICD-10-CM | POA: Diagnosis not present

## 2016-04-04 ENCOUNTER — Encounter: Payer: Self-pay | Admitting: Family

## 2016-04-04 ENCOUNTER — Ambulatory Visit (INDEPENDENT_AMBULATORY_CARE_PROVIDER_SITE_OTHER): Payer: Medicare HMO | Admitting: Family

## 2016-04-04 VITALS — BP 122/80 | HR 65 | Temp 97.7°F | Resp 14 | Ht 72.0 in | Wt 197.0 lb

## 2016-04-04 DIAGNOSIS — I482 Chronic atrial fibrillation: Secondary | ICD-10-CM

## 2016-04-04 DIAGNOSIS — M159 Polyosteoarthritis, unspecified: Secondary | ICD-10-CM

## 2016-04-04 DIAGNOSIS — M8949 Other hypertrophic osteoarthropathy, multiple sites: Secondary | ICD-10-CM

## 2016-04-04 DIAGNOSIS — M150 Primary generalized (osteo)arthritis: Secondary | ICD-10-CM | POA: Diagnosis not present

## 2016-04-04 DIAGNOSIS — I4821 Permanent atrial fibrillation: Secondary | ICD-10-CM

## 2016-04-04 DIAGNOSIS — M1990 Unspecified osteoarthritis, unspecified site: Secondary | ICD-10-CM | POA: Insufficient documentation

## 2016-04-04 MED ORDER — DICLOFENAC SODIUM 1 % TD GEL: 2.0000 g | Freq: Four times a day (QID) | TRANSDERMAL | 1 refills | Status: DC

## 2016-04-04 NOTE — Assessment & Plan Note (Signed)
Continues to experience atrial fibrillation and appears stable and anticoagulated with Coumadin. Managed by cardiology Coumadin clinic. No chest pain, shortness of breath, or heart palpitations. Continue current dosage of warfarin with changes per Coumadin clinic.

## 2016-04-04 NOTE — Assessment & Plan Note (Signed)
Symptoms and exam consistent with osteoarthritis. Encouraged conservative treatment with ice/moist heat, home exercise therapy, and over-the-counter medications as needed for symptom relief and supportive care. Start diclofenac gel. Follow-up if symptoms worsen or do not improve.

## 2016-04-04 NOTE — Patient Instructions (Addendum)
Thank you for choosing [facility].  SUMMARY AND INSTRUCTIONS:  Please continue to take your medications as prescribed.  Ice/moist heat x 20 minutes every 2 hours as needed.  Stretches and exercises daily.   Medication:  Your prescription(s) have been submitted to your pharmacy or been printed and provided for you. Please take as directed and contact our office if you believe you are having problem(s) with the medication(s) or have any questions.  Follow up:  If your symptoms worsen or fail to improve, please contact our office for further instruction, or in case of emergency go directly to the emergency room at the closest medical facility.

## 2016-04-04 NOTE — Progress Notes (Signed)
Subjective:    Patient ID: Wayne [name], male    DOB: May 14, 1930, 82 y.o.   MRN: [id number]  Chief Complaint  Patient presents with  . Establish Care    asking for an rx for diclofenac sodium topical gel, has back pain that he uses it for     HPI:  Wayne [name] is a 82 y.o. male who  has a past medical history of Arthritis; Atrial fibrillation ([employer] [facility] [employer]); BPH (benign prostatic hyperplasia); CAD (coronary artery disease); Chronic anticoagulation; History of chicken pox; HTN (hypertension); Old MI (myocardial infarction) (2007); and Stroke, embolic ([place]). and presents today for an office visit to establish care.   1.) Back pain - Associated symptom of pain located in lumbar spine has been waxing and waxing for about 5 years since he cracked his backbone. Was told he has signs of arthritis and is season and has exacerbations effected by the weather. Pain is described as achiness. Modifying factors include diclofenac sodium gel which has helped in the past. Does have some mild radiculopathy. Able to function and complete his activities of daily living.  2)  Atrial fibrillation - Currently anticoagulated with Warfarin. Reports taking the medication as prescribed and denies adverse side effects or nuiscane bleeding. Managed by cardiology Coumadin Clinic.   Allergies  Allergen Reactions  . Procaine Hcl   . Penicillins Rash      Outpatient Medications Prior to Visit  Medication Sig Dispense Refill  . digoxin (LANOXIN) 0.125 MG tablet Take 1 tablet (125 mcg total) by mouth daily. 90 tablet 3  . Glucosamine-Chondroitin (GLUCOSAMINE CHONDR COMPLEX [name]) Take 2 tablets by mouth daily.     Marland Kitchen guaiFENesin (MUCINEX) 600 MG 12 hr tablet Take 600 mg by mouth 2 (two) times daily as needed for cough or to loosen phlegm.     . metoprolol tartrate (LOPRESSOR) 25 MG tablet TAKE ONE TABLET BY MOUTH IN THE MORNING AND ONE-HALF IN THE EVENING 135 tablet 3  . Saw Palmetto, Serenoa repens, (SAW  PALMETTO BERRY [name]) Take 1 tablet by mouth daily.     Marland Kitchen triamcinolone (KENALOG) 0.1 % cream Apply topically 2 (two) times daily. As directed by P.A. Lennie Odor with Dr. Allyson Sabal     . warfarin (COUMADIN) 2.5 MG tablet TAKE AS DIRECTED BY ANTICOAGULATION CLINIC 180 tablet 1  . Multiple Vitamins-Minerals (MULTIVITAMIN WITH MINERALS) tablet Take 1 tablet by mouth daily.       No facility-administered medications prior to visit.       Past Surgical History:  Procedure Laterality Date  . CORONARY ANGIOPLASTY  2007   Distal OM  . PROSTATE BIOPSY        Past Medical History:  Diagnosis Date  . Arthritis   . Atrial fibrillation (HCC)    Chronic  . BPH (benign prostatic hyperplasia)   . CAD (coronary artery disease)   . Chronic anticoagulation   . History of chicken pox   . HTN (hypertension)   . Old MI (myocardial infarction) 2007   s/p PCI to distal OM (no stent)  . Stroke, embolic ([place])       Review of Systems  Constitutional: Negative for chills and fever.  Eyes:       Negative for changes in vision  Respiratory: Negative for cough, chest tightness and wheezing.   Cardiovascular: Negative for chest pain, palpitations and leg swelling.  Neurological: Negative for dizziness, weakness, light-headedness and numbness.      Objective:    BP 122/80 (BP  Location: Left Arm, Patient Position: Sitting, Cuff Size: Normal)   Pulse 65   Temp 97.7 F (36.5 C) (Oral)   Resp 14   Ht 6' (1.829 m)   Wt 197 lb (89.4 kg)   SpO2 96%   BMI 26.72 kg/m  Nursing note and vital signs reviewed.  Physical Exam  Constitutional: He is oriented to person, place, and time. He appears well-developed and well-nourished. No distress.  Cardiovascular: Normal rate and intact distal pulses.  An irregularly irregular rhythm present.  Murmur heard.  Systolic murmur is present with a grade of 3/6  Pulmonary/Chest: Effort normal and breath sounds normal.  Musculoskeletal:  Low back - no obvious  deformity, discoloration, or edema. Mild tenderness of lumbar paraspinal musculature noted bilaterally. Range of motion restricted secondary to discomfort and decreased flexibility. Distal pulses and sensation are intact and appropriate. Negative straight leg raise. Negative Faber's test.  Neurological: He is alert and oriented to person, place, and time.  Skin: Skin is warm and dry.  Psychiatric: He has a normal mood and affect. His behavior is normal. Judgment and thought content normal.       Assessment & Plan:   Problem List Items Addressed This Visit      Cardiovascular and Mediastinum   Atrial fibrillation ([place]) - Primary    Continues to experience atrial fibrillation and appears stable and anticoagulated with Coumadin. Managed by cardiology Coumadin clinic. No chest pain, shortness of breath, or heart palpitations. Continue current dosage of warfarin with changes per Coumadin clinic.        Musculoskeletal and Integument   Osteoarthritis    Symptoms and exam consistent with osteoarthritis. Encouraged conservative treatment with ice/moist heat, home exercise therapy, and over-the-counter medications as needed for symptom relief and supportive care. Start diclofenac gel. Follow-up if symptoms worsen or do not improve.      Relevant Medications   diclofenac sodium (VOLTAREN) 1 % GEL    Other Visit Diagnoses   None.      I have discontinued Mr. Messerli multivitamin with minerals. I am also having him start on diclofenac sodium. Additionally, I am having him maintain his (Saw Palmetto, Serenoa repens, (SAW PALMETTO BERRY [name])), Glucosamine-Chondroitin (GLUCOSAMINE CHONDR COMPLEX [name]), triamcinolone cream, guaiFENesin, warfarin, digoxin, and metoprolol tartrate.   Meds ordered this encounter  Medications  . diclofenac sodium (VOLTAREN) 1 % GEL    Sig: Apply 2 g topically 4 (four) times daily.    Dispense:  100 g    Refill:  1    Order Specific Question:   Supervising  Provider    Answer:   Pricilla Holm A [id number]     Follow-up: Return in about 6 months (around 10/03/2016).  Wayne Po, FNP

## 2016-04-06 DIAGNOSIS — H01025 Squamous blepharitis left lower eyelid: Secondary | ICD-10-CM | POA: Diagnosis not present

## 2016-04-06 DIAGNOSIS — H10413 Chronic giant papillary conjunctivitis, bilateral: Secondary | ICD-10-CM | POA: Diagnosis not present

## 2016-04-06 DIAGNOSIS — H04123 Dry eye syndrome of bilateral lacrimal glands: Secondary | ICD-10-CM | POA: Diagnosis not present

## 2016-04-06 DIAGNOSIS — H2511 Age-related nuclear cataract, right eye: Secondary | ICD-10-CM | POA: Diagnosis not present

## 2016-04-06 DIAGNOSIS — H01021 Squamous blepharitis right upper eyelid: Secondary | ICD-10-CM | POA: Diagnosis not present

## 2016-04-06 DIAGNOSIS — H02403 Unspecified ptosis of bilateral eyelids: Secondary | ICD-10-CM | POA: Diagnosis not present

## 2016-04-06 DIAGNOSIS — H01024 Squamous blepharitis left upper eyelid: Secondary | ICD-10-CM | POA: Diagnosis not present

## 2016-04-06 DIAGNOSIS — H01022 Squamous blepharitis right lower eyelid: Secondary | ICD-10-CM | POA: Diagnosis not present

## 2016-04-06 DIAGNOSIS — Z961 Presence of intraocular lens: Secondary | ICD-10-CM | POA: Diagnosis not present

## 2016-04-07 ENCOUNTER — Ambulatory Visit (INDEPENDENT_AMBULATORY_CARE_PROVIDER_SITE_OTHER): Payer: Medicare HMO | Admitting: Pharmacist Clinician (PhC)/ Clinical Pharmacy Specialist

## 2016-04-07 DIAGNOSIS — Z5181 Encounter for therapeutic drug level monitoring: Secondary | ICD-10-CM | POA: Diagnosis not present

## 2016-04-07 DIAGNOSIS — I6350 Cerebral infarction due to unspecified occlusion or stenosis of unspecified cerebral artery: Secondary | ICD-10-CM

## 2016-04-07 DIAGNOSIS — I4891 Unspecified atrial fibrillation: Secondary | ICD-10-CM | POA: Diagnosis not present

## 2016-04-07 LAB — POCT INR: INR: 2.5

## 2016-04-10 DIAGNOSIS — M48061 Spinal stenosis, lumbar region without neurogenic claudication: Secondary | ICD-10-CM | POA: Diagnosis not present

## 2016-04-10 DIAGNOSIS — M5442 Lumbago with sciatica, left side: Secondary | ICD-10-CM | POA: Diagnosis not present

## 2016-04-11 ENCOUNTER — Telehealth: Payer: Self-pay

## 2016-04-11 DIAGNOSIS — Z85828 Personal history of other malignant neoplasm of skin: Secondary | ICD-10-CM | POA: Diagnosis not present

## 2016-04-11 DIAGNOSIS — C44311 Basal cell carcinoma of skin of nose: Secondary | ICD-10-CM | POA: Diagnosis not present

## 2016-04-11 NOTE — Telephone Encounter (Signed)
PA for Diclofenac Sodium has been approved. Pt and pharmacy aware.

## 2016-04-22 ENCOUNTER — Emergency Department (HOSPITAL_BASED_OUTPATIENT_CLINIC_OR_DEPARTMENT_OTHER)
Admission: EM | Admit: 2016-04-22 | Discharge: 2016-04-22 | Disposition: A | Discharge status: Home / Self Care | Payer: Medicare HMO | Attending: Emergency Medicine | Admitting: Emergency Medicine

## 2016-04-22 ENCOUNTER — Encounter (HOSPITAL_BASED_OUTPATIENT_CLINIC_OR_DEPARTMENT_OTHER): Payer: Self-pay | Admitting: Emergency Medicine

## 2016-04-22 ENCOUNTER — Emergency Department (HOSPITAL_BASED_OUTPATIENT_CLINIC_OR_DEPARTMENT_OTHER): Payer: Medicare HMO

## 2016-04-22 DIAGNOSIS — I252 Old myocardial infarction: Secondary | ICD-10-CM | POA: Insufficient documentation

## 2016-04-22 DIAGNOSIS — I10 Essential (primary) hypertension: Secondary | ICD-10-CM | POA: Diagnosis not present

## 2016-04-22 DIAGNOSIS — M533 Sacrococcygeal disorders, not elsewhere classified: Secondary | ICD-10-CM | POA: Diagnosis not present

## 2016-04-22 DIAGNOSIS — K6289 Other specified diseases of anus and rectum: Secondary | ICD-10-CM | POA: Diagnosis present

## 2016-04-22 DIAGNOSIS — Z79899 Other long term (current) drug therapy: Secondary | ICD-10-CM | POA: Diagnosis not present

## 2016-04-22 DIAGNOSIS — Z8673 Personal history of transient ischemic attack (TIA), and cerebral infarction without residual deficits: Secondary | ICD-10-CM | POA: Diagnosis not present

## 2016-04-22 DIAGNOSIS — I2510 Atherosclerotic heart disease of native coronary artery without angina pectoris: Secondary | ICD-10-CM | POA: Insufficient documentation

## 2016-04-22 DIAGNOSIS — K644 Residual hemorrhoidal skin tags: Secondary | ICD-10-CM | POA: Diagnosis not present

## 2016-04-22 LAB — URINALYSIS, ROUTINE W REFLEX MICROSCOPIC
BILIRUBIN URINE: NEGATIVE
GLUCOSE, UA: NEGATIVE mg/dL
HGB URINE DIPSTICK: NEGATIVE
KETONES UR: NEGATIVE mg/dL
Leukocytes, UA: NEGATIVE
Nitrite: NEGATIVE
PROTEIN: NEGATIVE mg/dL
Specific Gravity, Urine: 1.014 (ref 1.005–1.030)
pH: 6.5 (ref 5.0–8.0)

## 2016-04-22 LAB — OCCULT BLOOD X 1 CARD TO LAB, STOOL: FECAL OCCULT BLD: NEGATIVE

## 2016-04-22 MED ORDER — ACETAMINOPHEN 325 MG PO TABS
650.0000 mg | ORAL_TABLET | Freq: Once | ORAL | Status: AC
  Administered 2016-04-22: 650 mg via ORAL
  Filled 2016-04-22: qty 2

## 2016-04-22 NOTE — Discharge Instructions (Signed)
Please read and follow all provided instructions.  Your diagnoses today include:  1. Sacral pain     Tests performed today include:  Vital signs - see below for your results today  Urine test - no infection in urine  X-ray of sacrum and tailbone - no fracture or bone problems  Test for blood in stool - no blood seen  Home care instructions:   Follow any educational materials contained in this packet  Please rest, use ice or heat on your back for the next several days  You may try topical medications on your tailbone area, doing soaks in warm water, or use a donut pillow for comfort  Follow-up instructions: Please follow-up with your primary care provider or orthopedist in the next 3 days for recheck and further evaluation of your symptoms.   Return instructions:  SEEK IMMEDIATE MEDICAL ATTENTION IF YOU HAVE:  New numbness, tingling, weakness, or problem with the use of your arms or legs  Severe back pain not relieved with medications  Loss control of your bowels or bladder  Increasing pain in any areas of the body (such as chest or abdominal pain)  Shortness of breath, dizziness, or fainting.   Worsening nausea (feeling sick to your stomach), vomiting, fever, or sweats  Any other emergent concerns regarding your health   Additional Information:  Your vital signs today were: BP 137/71    Pulse 70    Temp 98 F (36.7 C) (Oral)    Resp 18    Ht 5\' 10"  (1.778 m)    Wt 89.4 kg    SpO2 97%    BMI 28.27 kg/m  If your blood pressure (BP) was elevated above 135/85 this visit, please have this repeated by your doctor within one month. --------------

## 2016-04-22 NOTE — ED Triage Notes (Signed)
Pt reports rectal/sacral  Pain x few days with no known injury

## 2016-04-22 NOTE — ED Provider Notes (Signed)
[facility] DEPT [facility] Provider Note   CSN: [id number] Arrival date & time: 04/22/16  1046     History   Chief Complaint Chief Complaint  Patient presents with  . Back Pain    HPI Wayne [name] is a 82 y.o. male.  Patient with history of low back pain and radiculopathy, recent normal L-spine x-ray per patient and the course of prednisone -- presents with a new pain in his sacral area and rectum. The pain was minor at first but has worsened over the past 2 or 3 days. Patient states that the pain is not really better when he is lying down or when he is sitting he has intense pain in the rectal area. Patient denies warning symptoms of back pain including: fecal incontinence, urinary retention or overflow incontinence, night sweats, waking from sleep with back pain, unexplained fevers or weight loss, h/o cancer, recent trauma or falls. No blood in the stool or difficulty with bowel movements. No hematuria, urine changes. The onset of this condition was acute. The course is gradually worsening.      Past Medical History:  Diagnosis Date  . Arthritis   . Atrial fibrillation (HCC)    Chronic  . BPH (benign prostatic hyperplasia)   . CAD (coronary artery disease)   . Chronic anticoagulation   . History of chicken pox   . HTN (hypertension)   . Old MI (myocardial infarction) 2007   s/p PCI to distal OM (no stent)  . 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 Osteoarthritis 04/04/2016  . Encounter for therapeutic drug monitoring 08/06/2013  . Cough 08/09/2011  . Chronic anticoagulation   . HTN (hypertension)   . CAD (coronary artery disease)   . Atrial fibrillation ([place]) 09/13/2010  . Unspecified cerebral artery occlusion with cerebral infarction 09/13/2010    Past Surgical History:  Procedure Laterality Date  . CORONARY ANGIOPLASTY  2007   Distal OM  . PROSTATE BIOPSY         Home Medications    Prior to Admission  medications   Medication Sig Start Date End Date Taking? Authorizing Provider  diclofenac sodium (VOLTAREN) 1 % GEL Apply 2 g topically 4 (four) times daily. 04/04/16   Golden Circle, FNP  digoxin (LANOXIN) 0.125 MG tablet Take 1 tablet (125 mcg total) by mouth daily. 03/09/16   Lelon Perla, MD  Glucosamine-Chondroitin (GLUCOSAMINE CHONDR COMPLEX PO) Take 2 tablets by mouth daily.     Historical Provider, MD  guaiFENesin (MUCINEX) 600 MG 12 hr tablet Take 600 mg by mouth 2 (two) times daily as needed for cough or to loosen phlegm.     Historical Provider, MD  metoprolol tartrate (LOPRESSOR) 25 MG tablet TAKE ONE TABLET BY MOUTH IN THE MORNING AND ONE-HALF IN THE EVENING 03/09/16   Lelon Perla, MD  Saw Palmetto, Serenoa repens, (SAW PALMETTO BERRY PO) Take 1 tablet by mouth daily.     Historical Provider, MD  triamcinolone (KENALOG) 0.1 % cream Apply topically 2 (two) times daily. As directed by P.A. Lennie Odor with Dr. Allyson Sabal     Historical Provider, MD  warfarin (COUMADIN) 2.5 MG tablet TAKE AS DIRECTED BY ANTICOAGULATION CLINIC 08/02/15   Darlin Coco, MD    Family History Family History  Problem Relation Age of Onset  . Heart disease Mother   . Heart attack Mother   . Heart disease Father   . Heart attack Father   .  Heart attack Brother   . Heart attack Sister   . Stroke Brother     Social History Social History  Substance Use Topics  . Smoking status: Never Smoker  . Smokeless tobacco: Never Used  . Alcohol use No     Allergies   Procaine hcl and Penicillins   Review of Systems Review of Systems  Constitutional: Negative for fever and unexpected weight change.  Gastrointestinal: Positive for rectal pain. Negative for blood in stool and constipation.       Neg for fecal incontinence  Genitourinary: Negative for difficulty urinating, flank pain and hematuria.       Negative for urinary incontinence or retention  Musculoskeletal: Positive for back pain  (Sacral pain).  Neurological: Negative for weakness and numbness.       Negative for saddle paresthesias      Physical Exam Updated Vital Signs BP 137/71   Pulse 70   Temp 98 F (36.7 C) (Oral)   Resp 18   Ht 5\' 10"  (1.778 m)   Wt 89.4 kg   SpO2 97%   BMI 28.27 kg/m   Physical Exam  Constitutional: He appears well-developed and well-nourished.  HENT:  Head: Normocephalic and atraumatic.  Eyes: Conjunctivae are normal.  Neck: Normal range of motion.  Abdominal: Soft. There is no tenderness. There is no CVA tenderness.  Genitourinary: Rectal exam shows external hemorrhoid (non-inflammed). Rectal exam shows no internal hemorrhoid, no fissure, no tenderness and guaiac negative stool. Prostate is enlarged. Prostate is not tender.  Musculoskeletal:       Cervical back: Normal. He exhibits normal range of motion, no tenderness and no bony tenderness.       Thoracic back: Normal. He exhibits normal range of motion, no tenderness and no bony tenderness.       Lumbar back: He exhibits normal range of motion, no tenderness and no bony tenderness.  No step-off noted with palpation of spine.   Neurological: He is alert. He has normal reflexes. No sensory deficit. He exhibits normal muscle tone.  5/5 strength in entire lower extremities bilaterally. No sensation deficit.   Skin: Skin is warm and dry.  Psychiatric: He has a normal mood and affect.  Nursing note and vitals reviewed.    ED Treatments / Results  Labs (all labs ordered are listed, but only abnormal results are displayed) Labs Reviewed  OCCULT BLOOD X 1 CARD TO LAB, STOOL  URINALYSIS, ROUTINE W REFLEX MICROSCOPIC (NOT AT [facility])    Radiology Dg Sacrum/coccyx  Result Date: 04/22/2016 CLINICAL DATA:  Pt with rectal/buttock pain, NKI, pain with sitting, pain x 3 days EXAM: SACRUM AND COCCYX - 2+ VIEW COMPARISON:  None. FINDINGS: No fracture. No bone lesion. Skeletal structures are demineralized. SI joints are normally  spaced and aligned. Soft tissues are unremarkable. IMPRESSION: No fracture or acute finding. Electronically Signed   By: Lajean Manes M.D.   On: 04/22/2016 11:51    Procedures Procedures (including critical care time)   Initial Impression / Assessment and Plan / ED Course  I have reviewed the triage vital signs and the nursing notes.  Pertinent labs & imaging results that were available during my care of the patient were reviewed by me and considered in my medical decision making (see chart for details).  Clinical Course   Patient seen and examined. DRE with nurse chaperone (Sophie). Work-up initiated.   Vital signs reviewed and are as follows: BP 137/71   Pulse 70   Temp 98 F (  36.7 C) (Oral)   Resp 18   Ht 5\' 10"  (1.778 m)   Wt 89.4 kg   SpO2 97%   BMI 28.27 kg/m   1:25 PM patient and family updated on results. Patient discussed with and seen by Dr. Tomi Bamberger.  No red flag s/s of low back pain. Patient counseled to use ice or heat on back for no longer than 15 minutes every hour. Counseled on Tylenol use, donut pillow.  Patient urged to follow-up with PCP.ortho if pain does not improve with treatment and rest or if pain becomes recurrent. Urged to return with worsening severe pain, loss of bowel or bladder control, trouble walking.   The patient verbalizes understanding and agrees with the plan.   Final Clinical Impressions(s) / ED Diagnoses   Final diagnoses:  Sacral pain   Patient with sacral/rectal pain. No inflamed hemorrhoids noted. No signs of mass, abscess, prostatitis. Urine is clear. X-rays normal. Suspect musculoskeletal cause. Treatment as above. Patient does have orthopedic follow-up. Return instructions as above.  New Prescriptions New Prescriptions   No medications on file     Carlisle Cater, PA-C 04/22/16 [place], MD 04/23/16 [phone]

## 2016-04-22 NOTE — ED Notes (Signed)
States has been having rectal pain when sitting and lower back pain when walking, laying on stretcher denies pain or recent injury

## 2016-04-24 ENCOUNTER — Ambulatory Visit: Payer: Medicare HMO | Admitting: Family

## 2016-04-24 DIAGNOSIS — M5137 Other intervertebral disc degeneration, lumbosacral region: Secondary | ICD-10-CM | POA: Diagnosis not present

## 2016-04-24 DIAGNOSIS — M9903 Segmental and somatic dysfunction of lumbar region: Secondary | ICD-10-CM | POA: Diagnosis not present

## 2016-04-24 DIAGNOSIS — M25552 Pain in left hip: Secondary | ICD-10-CM | POA: Diagnosis not present

## 2016-04-24 DIAGNOSIS — M461 Sacroiliitis, not elsewhere classified: Secondary | ICD-10-CM | POA: Diagnosis not present

## 2016-04-24 DIAGNOSIS — M9904 Segmental and somatic dysfunction of sacral region: Secondary | ICD-10-CM | POA: Diagnosis not present

## 2016-04-24 DIAGNOSIS — M9905 Segmental and somatic dysfunction of pelvic region: Secondary | ICD-10-CM | POA: Diagnosis not present

## 2016-04-26 DIAGNOSIS — M5137 Other intervertebral disc degeneration, lumbosacral region: Secondary | ICD-10-CM | POA: Diagnosis not present

## 2016-04-26 DIAGNOSIS — M25552 Pain in left hip: Secondary | ICD-10-CM | POA: Diagnosis not present

## 2016-04-26 DIAGNOSIS — M9904 Segmental and somatic dysfunction of sacral region: Secondary | ICD-10-CM | POA: Diagnosis not present

## 2016-04-26 DIAGNOSIS — M9903 Segmental and somatic dysfunction of lumbar region: Secondary | ICD-10-CM | POA: Diagnosis not present

## 2016-04-26 DIAGNOSIS — M461 Sacroiliitis, not elsewhere classified: Secondary | ICD-10-CM | POA: Diagnosis not present

## 2016-04-26 DIAGNOSIS — M9905 Segmental and somatic dysfunction of pelvic region: Secondary | ICD-10-CM | POA: Diagnosis not present

## 2016-04-28 DIAGNOSIS — M9905 Segmental and somatic dysfunction of pelvic region: Secondary | ICD-10-CM | POA: Diagnosis not present

## 2016-04-28 DIAGNOSIS — M461 Sacroiliitis, not elsewhere classified: Secondary | ICD-10-CM | POA: Diagnosis not present

## 2016-04-28 DIAGNOSIS — M9904 Segmental and somatic dysfunction of sacral region: Secondary | ICD-10-CM | POA: Diagnosis not present

## 2016-04-28 DIAGNOSIS — M9903 Segmental and somatic dysfunction of lumbar region: Secondary | ICD-10-CM | POA: Diagnosis not present

## 2016-04-28 DIAGNOSIS — M5137 Other intervertebral disc degeneration, lumbosacral region: Secondary | ICD-10-CM | POA: Diagnosis not present

## 2016-04-28 DIAGNOSIS — M25552 Pain in left hip: Secondary | ICD-10-CM | POA: Diagnosis not present

## 2016-05-03 DIAGNOSIS — M9905 Segmental and somatic dysfunction of pelvic region: Secondary | ICD-10-CM | POA: Diagnosis not present

## 2016-05-03 DIAGNOSIS — M461 Sacroiliitis, not elsewhere classified: Secondary | ICD-10-CM | POA: Diagnosis not present

## 2016-05-03 DIAGNOSIS — M9903 Segmental and somatic dysfunction of lumbar region: Secondary | ICD-10-CM | POA: Diagnosis not present

## 2016-05-03 DIAGNOSIS — M25552 Pain in left hip: Secondary | ICD-10-CM | POA: Diagnosis not present

## 2016-05-03 DIAGNOSIS — M9904 Segmental and somatic dysfunction of sacral region: Secondary | ICD-10-CM | POA: Diagnosis not present

## 2016-05-03 DIAGNOSIS — M5137 Other intervertebral disc degeneration, lumbosacral region: Secondary | ICD-10-CM | POA: Diagnosis not present

## 2016-05-04 DIAGNOSIS — M9903 Segmental and somatic dysfunction of lumbar region: Secondary | ICD-10-CM | POA: Diagnosis not present

## 2016-05-04 DIAGNOSIS — M5137 Other intervertebral disc degeneration, lumbosacral region: Secondary | ICD-10-CM | POA: Diagnosis not present

## 2016-05-04 DIAGNOSIS — M9904 Segmental and somatic dysfunction of sacral region: Secondary | ICD-10-CM | POA: Diagnosis not present

## 2016-05-04 DIAGNOSIS — M9905 Segmental and somatic dysfunction of pelvic region: Secondary | ICD-10-CM | POA: Diagnosis not present

## 2016-05-04 DIAGNOSIS — M25552 Pain in left hip: Secondary | ICD-10-CM | POA: Diagnosis not present

## 2016-05-04 DIAGNOSIS — M461 Sacroiliitis, not elsewhere classified: Secondary | ICD-10-CM | POA: Diagnosis not present

## 2016-05-08 DIAGNOSIS — M461 Sacroiliitis, not elsewhere classified: Secondary | ICD-10-CM | POA: Diagnosis not present

## 2016-05-08 DIAGNOSIS — M9904 Segmental and somatic dysfunction of sacral region: Secondary | ICD-10-CM | POA: Diagnosis not present

## 2016-05-08 DIAGNOSIS — M5137 Other intervertebral disc degeneration, lumbosacral region: Secondary | ICD-10-CM | POA: Diagnosis not present

## 2016-05-08 DIAGNOSIS — M9903 Segmental and somatic dysfunction of lumbar region: Secondary | ICD-10-CM | POA: Diagnosis not present

## 2016-05-08 DIAGNOSIS — M9905 Segmental and somatic dysfunction of pelvic region: Secondary | ICD-10-CM | POA: Diagnosis not present

## 2016-05-08 DIAGNOSIS — M25552 Pain in left hip: Secondary | ICD-10-CM | POA: Diagnosis not present

## 2016-05-09 DIAGNOSIS — M9904 Segmental and somatic dysfunction of sacral region: Secondary | ICD-10-CM | POA: Diagnosis not present

## 2016-05-09 DIAGNOSIS — M25552 Pain in left hip: Secondary | ICD-10-CM | POA: Diagnosis not present

## 2016-05-09 DIAGNOSIS — M9905 Segmental and somatic dysfunction of pelvic region: Secondary | ICD-10-CM | POA: Diagnosis not present

## 2016-05-09 DIAGNOSIS — M5137 Other intervertebral disc degeneration, lumbosacral region: Secondary | ICD-10-CM | POA: Diagnosis not present

## 2016-05-09 DIAGNOSIS — M9903 Segmental and somatic dysfunction of lumbar region: Secondary | ICD-10-CM | POA: Diagnosis not present

## 2016-05-09 DIAGNOSIS — M461 Sacroiliitis, not elsewhere classified: Secondary | ICD-10-CM | POA: Diagnosis not present

## 2016-05-10 DIAGNOSIS — M25552 Pain in left hip: Secondary | ICD-10-CM | POA: Diagnosis not present

## 2016-05-10 DIAGNOSIS — M9904 Segmental and somatic dysfunction of sacral region: Secondary | ICD-10-CM | POA: Diagnosis not present

## 2016-05-10 DIAGNOSIS — M461 Sacroiliitis, not elsewhere classified: Secondary | ICD-10-CM | POA: Diagnosis not present

## 2016-05-10 DIAGNOSIS — M9905 Segmental and somatic dysfunction of pelvic region: Secondary | ICD-10-CM | POA: Diagnosis not present

## 2016-05-10 DIAGNOSIS — M9903 Segmental and somatic dysfunction of lumbar region: Secondary | ICD-10-CM | POA: Diagnosis not present

## 2016-05-10 DIAGNOSIS — M5137 Other intervertebral disc degeneration, lumbosacral region: Secondary | ICD-10-CM | POA: Diagnosis not present

## 2016-05-15 DIAGNOSIS — M25552 Pain in left hip: Secondary | ICD-10-CM | POA: Diagnosis not present

## 2016-05-15 DIAGNOSIS — M9904 Segmental and somatic dysfunction of sacral region: Secondary | ICD-10-CM | POA: Diagnosis not present

## 2016-05-15 DIAGNOSIS — M461 Sacroiliitis, not elsewhere classified: Secondary | ICD-10-CM | POA: Diagnosis not present

## 2016-05-15 DIAGNOSIS — M9905 Segmental and somatic dysfunction of pelvic region: Secondary | ICD-10-CM | POA: Diagnosis not present

## 2016-05-15 DIAGNOSIS — M9903 Segmental and somatic dysfunction of lumbar region: Secondary | ICD-10-CM | POA: Diagnosis not present

## 2016-05-15 DIAGNOSIS — M5137 Other intervertebral disc degeneration, lumbosacral region: Secondary | ICD-10-CM | POA: Diagnosis not present

## 2016-05-16 DIAGNOSIS — M25552 Pain in left hip: Secondary | ICD-10-CM | POA: Diagnosis not present

## 2016-05-16 DIAGNOSIS — M9904 Segmental and somatic dysfunction of sacral region: Secondary | ICD-10-CM | POA: Diagnosis not present

## 2016-05-16 DIAGNOSIS — M5137 Other intervertebral disc degeneration, lumbosacral region: Secondary | ICD-10-CM | POA: Diagnosis not present

## 2016-05-16 DIAGNOSIS — M9903 Segmental and somatic dysfunction of lumbar region: Secondary | ICD-10-CM | POA: Diagnosis not present

## 2016-05-16 DIAGNOSIS — M461 Sacroiliitis, not elsewhere classified: Secondary | ICD-10-CM | POA: Diagnosis not present

## 2016-05-16 DIAGNOSIS — M9905 Segmental and somatic dysfunction of pelvic region: Secondary | ICD-10-CM | POA: Diagnosis not present

## 2016-05-19 ENCOUNTER — Ambulatory Visit (INDEPENDENT_AMBULATORY_CARE_PROVIDER_SITE_OTHER): Payer: Medicare HMO | Admitting: Pharmacist

## 2016-05-19 DIAGNOSIS — I4891 Unspecified atrial fibrillation: Secondary | ICD-10-CM | POA: Diagnosis not present

## 2016-05-19 DIAGNOSIS — Z5181 Encounter for therapeutic drug level monitoring: Secondary | ICD-10-CM | POA: Diagnosis not present

## 2016-05-19 DIAGNOSIS — I6350 Cerebral infarction due to unspecified occlusion or stenosis of unspecified cerebral artery: Secondary | ICD-10-CM | POA: Diagnosis not present

## 2016-05-19 LAB — POCT INR: INR: 1.9

## 2016-05-25 ENCOUNTER — Other Ambulatory Visit: Payer: Self-pay | Admitting: Cardiology

## 2016-05-25 MED ORDER — WARFARIN SODIUM 2.5 MG PO TABS: ORAL_TABLET | ORAL | 1 refills | Status: DC

## 2016-05-25 NOTE — Telephone Encounter (Signed)
New message  1. Warfarin 2.5mg   2.[facility] [phone] fax 3. 90 day supply

## 2016-06-01 DIAGNOSIS — M5137 Other intervertebral disc degeneration, lumbosacral region: Secondary | ICD-10-CM | POA: Diagnosis not present

## 2016-06-01 DIAGNOSIS — M461 Sacroiliitis, not elsewhere classified: Secondary | ICD-10-CM | POA: Diagnosis not present

## 2016-06-01 DIAGNOSIS — M25552 Pain in left hip: Secondary | ICD-10-CM | POA: Diagnosis not present

## 2016-06-01 DIAGNOSIS — M9903 Segmental and somatic dysfunction of lumbar region: Secondary | ICD-10-CM | POA: Diagnosis not present

## 2016-06-01 DIAGNOSIS — M9905 Segmental and somatic dysfunction of pelvic region: Secondary | ICD-10-CM | POA: Diagnosis not present

## 2016-06-01 DIAGNOSIS — M9904 Segmental and somatic dysfunction of sacral region: Secondary | ICD-10-CM | POA: Diagnosis not present

## 2016-06-20 DIAGNOSIS — M25552 Pain in left hip: Secondary | ICD-10-CM | POA: Diagnosis not present

## 2016-06-20 DIAGNOSIS — M9904 Segmental and somatic dysfunction of sacral region: Secondary | ICD-10-CM | POA: Diagnosis not present

## 2016-06-20 DIAGNOSIS — M9903 Segmental and somatic dysfunction of lumbar region: Secondary | ICD-10-CM | POA: Diagnosis not present

## 2016-06-20 DIAGNOSIS — M9905 Segmental and somatic dysfunction of pelvic region: Secondary | ICD-10-CM | POA: Diagnosis not present

## 2016-06-20 DIAGNOSIS — M461 Sacroiliitis, not elsewhere classified: Secondary | ICD-10-CM | POA: Diagnosis not present

## 2016-06-20 DIAGNOSIS — M5137 Other intervertebral disc degeneration, lumbosacral region: Secondary | ICD-10-CM | POA: Diagnosis not present

## 2016-06-23 ENCOUNTER — Ambulatory Visit (INDEPENDENT_AMBULATORY_CARE_PROVIDER_SITE_OTHER): Payer: Medicare HMO | Admitting: Pharmacist Clinician (PhC)/ Clinical Pharmacy Specialist

## 2016-06-23 DIAGNOSIS — Z5181 Encounter for therapeutic drug level monitoring: Secondary | ICD-10-CM | POA: Diagnosis not present

## 2016-06-23 DIAGNOSIS — I4891 Unspecified atrial fibrillation: Secondary | ICD-10-CM | POA: Diagnosis not present

## 2016-06-23 DIAGNOSIS — I6350 Cerebral infarction due to unspecified occlusion or stenosis of unspecified cerebral artery: Secondary | ICD-10-CM | POA: Diagnosis not present

## 2016-06-23 LAB — POCT INR: INR: 1.6

## 2016-06-30 ENCOUNTER — Encounter (HOSPITAL_COMMUNITY): Payer: Self-pay

## 2016-06-30 ENCOUNTER — Emergency Department (HOSPITAL_COMMUNITY): Payer: Medicare HMO

## 2016-06-30 ENCOUNTER — Inpatient Hospital Stay (HOSPITAL_COMMUNITY)
Admission: EM | Admit: 2016-06-30 | Discharge: 2016-07-04 | DRG: 195 | Disposition: A | Discharge status: Home / Self Care | Payer: Medicare HMO | Attending: Internal Medicine | Admitting: Internal Medicine

## 2016-06-30 ENCOUNTER — Telehealth: Payer: Self-pay | Admitting: Cardiology

## 2016-06-30 DIAGNOSIS — R05 Cough: Secondary | ICD-10-CM

## 2016-06-30 DIAGNOSIS — N401 Enlarged prostate with lower urinary tract symptoms: Secondary | ICD-10-CM | POA: Diagnosis present

## 2016-06-30 DIAGNOSIS — R0602 Shortness of breath: Secondary | ICD-10-CM | POA: Diagnosis not present

## 2016-06-30 DIAGNOSIS — J101 Influenza due to other identified influenza virus with other respiratory manifestations: Secondary | ICD-10-CM | POA: Diagnosis present

## 2016-06-30 DIAGNOSIS — R32 Unspecified urinary incontinence: Secondary | ICD-10-CM | POA: Diagnosis present

## 2016-06-30 DIAGNOSIS — R059 Cough, unspecified: Secondary | ICD-10-CM | POA: Diagnosis present

## 2016-06-30 DIAGNOSIS — J1000 Influenza due to other identified influenza virus with unspecified type of pneumonia: Principal | ICD-10-CM | POA: Diagnosis present

## 2016-06-30 DIAGNOSIS — R531 Weakness: Secondary | ICD-10-CM | POA: Diagnosis not present

## 2016-06-30 DIAGNOSIS — J181 Lobar pneumonia, unspecified organism: Secondary | ICD-10-CM | POA: Diagnosis not present

## 2016-06-30 DIAGNOSIS — J111 Influenza due to unidentified influenza virus with other respiratory manifestations: Secondary | ICD-10-CM | POA: Diagnosis not present

## 2016-06-30 DIAGNOSIS — Z79899 Other long term (current) drug therapy: Secondary | ICD-10-CM

## 2016-06-30 DIAGNOSIS — Z886 Allergy status to analgesic agent status: Secondary | ICD-10-CM

## 2016-06-30 DIAGNOSIS — I252 Old myocardial infarction: Secondary | ICD-10-CM

## 2016-06-30 DIAGNOSIS — I10 Essential (primary) hypertension: Secondary | ICD-10-CM | POA: Diagnosis present

## 2016-06-30 DIAGNOSIS — I2510 Atherosclerotic heart disease of native coronary artery without angina pectoris: Secondary | ICD-10-CM | POA: Diagnosis present

## 2016-06-30 DIAGNOSIS — I482 Chronic atrial fibrillation, unspecified: Secondary | ICD-10-CM | POA: Diagnosis present

## 2016-06-30 DIAGNOSIS — E876 Hypokalemia: Secondary | ICD-10-CM | POA: Diagnosis present

## 2016-06-30 DIAGNOSIS — R2689 Other abnormalities of gait and mobility: Secondary | ICD-10-CM

## 2016-06-30 DIAGNOSIS — R404 Transient alteration of awareness: Secondary | ICD-10-CM | POA: Diagnosis not present

## 2016-06-30 DIAGNOSIS — I447 Left bundle-branch block, unspecified: Secondary | ICD-10-CM | POA: Diagnosis present

## 2016-06-30 DIAGNOSIS — Z888 Allergy status to other drugs, medicaments and biological substances status: Secondary | ICD-10-CM

## 2016-06-30 DIAGNOSIS — Z880 Allergy status to penicillin: Secondary | ICD-10-CM

## 2016-06-30 DIAGNOSIS — R338 Other retention of urine: Secondary | ICD-10-CM | POA: Diagnosis present

## 2016-06-30 DIAGNOSIS — Z8619 Personal history of other infectious and parasitic diseases: Secondary | ICD-10-CM

## 2016-06-30 DIAGNOSIS — Z7901 Long term (current) use of anticoagulants: Secondary | ICD-10-CM | POA: Diagnosis not present

## 2016-06-30 DIAGNOSIS — J189 Pneumonia, unspecified organism: Secondary | ICD-10-CM | POA: Diagnosis present

## 2016-06-30 DIAGNOSIS — Z9861 Coronary angioplasty status: Secondary | ICD-10-CM

## 2016-06-30 DIAGNOSIS — Z823 Family history of stroke: Secondary | ICD-10-CM

## 2016-06-30 DIAGNOSIS — Z8249 Family history of ischemic heart disease and other diseases of the circulatory system: Secondary | ICD-10-CM

## 2016-06-30 DIAGNOSIS — Z8673 Personal history of transient ischemic attack (TIA), and cerebral infarction without residual deficits: Secondary | ICD-10-CM

## 2016-06-30 LAB — URINALYSIS, ROUTINE W REFLEX MICROSCOPIC
BILIRUBIN URINE: NEGATIVE
Bacteria, UA: NONE SEEN
GLUCOSE, UA: NEGATIVE mg/dL
Hgb urine dipstick: NEGATIVE
KETONES UR: 5 mg/dL — AB
LEUKOCYTES UA: NEGATIVE
Nitrite: NEGATIVE
PH: 6 (ref 5.0–8.0)
Protein, ur: 30 mg/dL — AB
Specific Gravity, Urine: 1.019 (ref 1.005–1.030)
Squamous Epithelial / LPF: NONE SEEN

## 2016-06-30 LAB — INFLUENZA PANEL BY PCR (TYPE A & B)
Influenza A By PCR: POSITIVE — AB
Influenza B By PCR: NEGATIVE

## 2016-06-30 LAB — CBC WITH DIFFERENTIAL/PLATELET
BASOS ABS: 0 10*3/uL (ref 0.0–0.1)
Basophils Relative: 1 %
EOS ABS: 0 10*3/uL (ref 0.0–0.7)
EOS PCT: 0 %
HCT: 40.3 % (ref 39.0–52.0)
HEMOGLOBIN: 13.5 g/dL (ref 13.0–17.0)
LYMPHS PCT: 21 %
Lymphs Abs: 1.2 10*3/uL (ref 0.7–4.0)
MCH: 29.4 pg (ref 26.0–34.0)
MCHC: 33.5 g/dL (ref 30.0–36.0)
MCV: 87.8 fL (ref 78.0–100.0)
Monocytes Absolute: 0.5 10*3/uL (ref 0.1–1.0)
Monocytes Relative: 9 %
NEUTROS PCT: 69 %
Neutro Abs: 4.2 10*3/uL (ref 1.7–7.7)
PLATELETS: 126 10*3/uL — AB (ref 150–400)
RBC: 4.59 MIL/uL (ref 4.22–5.81)
RDW: 15.1 % (ref 11.5–15.5)
WBC: 6 10*3/uL (ref 4.0–10.5)

## 2016-06-30 LAB — COMPREHENSIVE METABOLIC PANEL
ALT: 16 U/L — AB (ref 17–63)
ANION GAP: 8 (ref 5–15)
AST: 28 U/L (ref 15–41)
Albumin: 3.3 g/dL — ABNORMAL LOW (ref 3.5–5.0)
Alkaline Phosphatase: 31 U/L — ABNORMAL LOW (ref 38–126)
BUN: 12 mg/dL (ref 6–20)
CHLORIDE: 104 mmol/L (ref 101–111)
CO2: 23 mmol/L (ref 22–32)
CREATININE: 1.16 mg/dL (ref 0.61–1.24)
Calcium: 8.1 mg/dL — ABNORMAL LOW (ref 8.9–10.3)
GFR, EST NON AFRICAN AMERICAN: 56 mL/min — AB (ref >60)
Glucose, Bld: 95 mg/dL (ref 65–99)
Potassium: 3.9 mmol/L (ref 3.5–5.1)
SODIUM: 135 mmol/L (ref 135–145)
Total Bilirubin: 1.1 mg/dL (ref 0.3–1.2)
Total Protein: 6.3 g/dL — ABNORMAL LOW (ref 6.5–8.1)

## 2016-06-30 LAB — I-STAT TROPONIN, ED: Troponin i, poc: 0.06 ng/mL (ref 0.00–0.08)

## 2016-06-30 LAB — I-STAT CG4 LACTIC ACID, ED
LACTIC ACID, VENOUS: 1.38 mmol/L (ref 0.5–1.9)
LACTIC ACID, VENOUS: 1.28 mmol/L (ref 0.5–1.9)

## 2016-06-30 LAB — BRAIN NATRIURETIC PEPTIDE: B Natriuretic Peptide: 195.3 pg/mL — ABNORMAL HIGH (ref 0.0–100.0)

## 2016-06-30 LAB — PROTIME-INR
INR: 1.61
PROTHROMBIN TIME: 19.4 s — AB (ref 11.4–15.2)

## 2016-06-30 MED ORDER — SODIUM CHLORIDE 0.9 % IV BOLUS (SEPSIS)
500.0000 mL | Freq: Once | INTRAVENOUS | Status: AC
  Administered 2016-06-30: 500 mL via INTRAVENOUS

## 2016-06-30 MED ORDER — ACETAMINOPHEN 325 MG PO TABS: 650.0000 mg | ORAL_TABLET | Freq: Once | ORAL | Status: DC | PRN

## 2016-06-30 MED ORDER — METOPROLOL TARTRATE 12.5 MG HALF TABLET
12.5000 mg | ORAL_TABLET | Freq: Two times a day (BID) | ORAL | Status: DC
  Administered 2016-06-30 – 2016-07-04 (×8): 12.5 mg via ORAL
  Filled 2016-06-30 (×8): qty 1

## 2016-06-30 MED ORDER — ONDANSETRON HCL 4 MG/2ML IJ SOLN: 4.0000 mg | Freq: Four times a day (QID) | INTRAMUSCULAR | Status: DC | PRN

## 2016-06-30 MED ORDER — OSELTAMIVIR PHOSPHATE 75 MG PO CAPS: 75.0000 mg | ORAL_CAPSULE | Freq: Once | ORAL | Status: DC

## 2016-06-30 MED ORDER — ONDANSETRON HCL 4 MG PO TABS: 4.0000 mg | ORAL_TABLET | Freq: Four times a day (QID) | ORAL | Status: DC | PRN

## 2016-06-30 MED ORDER — ACETAMINOPHEN 500 MG PO TABS
1000.0000 mg | ORAL_TABLET | Freq: Once | ORAL | Status: AC
  Administered 2016-06-30: 1000 mg via ORAL
  Filled 2016-06-30: qty 2

## 2016-06-30 MED ORDER — OSELTAMIVIR PHOSPHATE 30 MG PO CAPS
30.0000 mg | ORAL_CAPSULE | Freq: Two times a day (BID) | ORAL | Status: DC
Start: 2016-06-30 — End: 2016-07-04
  Administered 2016-06-30 – 2016-07-04 (×8): 30 mg via ORAL
  Filled 2016-06-30 (×8): qty 1

## 2016-06-30 MED ORDER — SODIUM CHLORIDE 0.9% FLUSH
3.0000 mL | Freq: Two times a day (BID) | INTRAVENOUS | Status: DC
  Administered 2016-06-30 – 2016-07-03 (×5): 3 mL via INTRAVENOUS

## 2016-06-30 MED ORDER — GUAIFENESIN ER 600 MG PO TB12: 600.0000 mg | ORAL_TABLET | Freq: Two times a day (BID) | ORAL | Status: DC | PRN

## 2016-06-30 MED ORDER — OSELTAMIVIR PHOSPHATE 30 MG PO CAPS
30.0000 mg | ORAL_CAPSULE | Freq: Once | ORAL | Status: AC
  Administered 2016-06-30: 30 mg via ORAL
  Filled 2016-06-30: qty 1

## 2016-06-30 MED ORDER — WARFARIN - PHARMACIST DOSING INPATIENT
Freq: Every day | Status: DC
  Administered 2016-07-01: 18:00:00

## 2016-06-30 MED ORDER — SODIUM CHLORIDE 0.9% FLUSH: 3.0000 mL | INTRAVENOUS | Status: DC | PRN

## 2016-06-30 MED ORDER — SODIUM CHLORIDE 0.9 % IV SOLN: 250.0000 mL | INTRAVENOUS | Status: DC | PRN

## 2016-06-30 MED ORDER — IBUPROFEN 400 MG PO TABS
400.0000 mg | ORAL_TABLET | Freq: Four times a day (QID) | ORAL | Status: DC | PRN
  Filled 2016-06-30: qty 1

## 2016-06-30 MED ORDER — WARFARIN SODIUM 7.5 MG PO TABS
7.5000 mg | ORAL_TABLET | Freq: Once | ORAL | Status: AC
  Administered 2016-06-30: 7.5 mg via ORAL
  Filled 2016-06-30: qty 1

## 2016-06-30 MED ORDER — DIGOXIN 125 MCG PO TABS
125.0000 ug | ORAL_TABLET | Freq: Every day | ORAL | Status: DC
  Administered 2016-07-01 – 2016-07-04 (×4): 125 ug via ORAL
  Filled 2016-06-30 (×4): qty 1

## 2016-06-30 MED ORDER — SODIUM CHLORIDE 0.9 % IV SOLN
Freq: Once | INTRAVENOUS | Status: AC
  Administered 2016-06-30: 15:00:00 via INTRAVENOUS

## 2016-06-30 NOTE — H&P (Signed)
History and Physical:    Wayne [name]   [id number] DOB: 04-21-1930 DOA: 06/30/2016  Referring MD/provider: Charlesetta Shanks, MD PCP: Mauricio Po, [place]   Patient coming from: Home  Chief Complaint: Weakness  History of Present Illness:   Wayne [name] is an 83 y.o. male with a PMH of CAD, hypertension, prior MI, atrial fibrillation on chronic Coumadin, and history of stroke who presented to the ED today for evaluation of a 2 day history of cough with yellow sputum production associated with loss of appetite and progressive weakness to the point where he could not get out of bed to dress himself this morning. According to his wife, he walks daily. Denies fever/chills. Denies myalgias.  ED Course:  The patient was noted to have a temperature of 102.2F. Influenza testing was positive and he subsequently was referred for observation due to his weakness.  ROS:   Review of Systems  Constitutional: Positive for malaise/fatigue.       Febrile despite saying he has not had fever.  HENT: Negative.   Eyes: Negative.   Respiratory: Positive for cough and sputum production. Negative for shortness of breath.   Cardiovascular: Negative for chest pain and leg swelling.  Gastrointestinal: Negative.   Genitourinary: Negative.   Musculoskeletal: Negative.   Skin: Negative.   Neurological: Positive for weakness.  Psychiatric/Behavioral: Negative.     Past Medical History:   Past Medical History:  Diagnosis Date  . Arthritis   . Atrial fibrillation (HCC)    Chronic  . BPH (benign prostatic hyperplasia)   . CAD (coronary artery disease)   . Chronic anticoagulation   . History of chicken pox   . HTN (hypertension)   . Old MI (myocardial infarction) 2007   s/p PCI to distal OM (no stent)  . 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 CORONARY ANGIOPLASTY  2007   Distal OM  . PROSTATE BIOPSY       Social History:   Social History   Social History  . Marital status: Married    Spouse name: N/A  . Number of children: 3  . Years of education: 101   Occupational History  . Not on file.   Social History Main Topics  . Smoking status: Never Smoker  . Smokeless tobacco: Never Used  . Alcohol use No  . Drug use: No  . Sexual activity: Not on file   Other Topics Concern  . Not on file   Social History Narrative   Fun: Garden, work in the yard, anything physical and walk daily    Allergies   Procaine hcl; Tylenol [acetaminophen]; and Penicillins  Family history:   Family History  Problem Relation Age of Onset  . Heart disease Mother   . Heart attack Mother   . Heart disease Father   . Heart attack Father   . Heart attack Brother   . Heart attack Sister   . Stroke Brother     Current Medications:   Prior to Admission medications   Medication Sig Start Date End Date Taking? Authorizing Provider  diclofenac sodium (VOLTAREN) 1 % GEL Apply 2 g topically 4 (four) times daily. 04/04/16   Golden Circle, FNP  digoxin (LANOXIN) 0.125 MG tablet Take 1 tablet (125 mcg total) by mouth daily. 03/09/16   Lelon Perla, MD  Glucosamine-Chondroitin (GLUCOSAMINE CHONDR COMPLEX PO) Take 2 tablets by mouth daily.  Historical Provider, MD  guaiFENesin (MUCINEX) 600 MG 12 hr tablet Take 600 mg by mouth 2 (two) times daily as needed for cough or to loosen phlegm.     Historical Provider, MD  metoprolol tartrate (LOPRESSOR) 25 MG tablet TAKE ONE TABLET BY MOUTH IN THE MORNING AND ONE-HALF IN THE EVENING 03/09/16   Lelon Perla, MD  Saw Palmetto, Serenoa repens, (SAW PALMETTO BERRY PO) Take 1 tablet by mouth daily.     Historical Provider, MD  triamcinolone (KENALOG) 0.1 % cream Apply topically 2 (two) times daily. As directed by P.A. Lennie Odor with Dr. Allyson Sabal     Historical Provider, MD  warfarin (COUMADIN) 2.5 MG tablet Take 1 to 1.5 tablets by mouth daily as  directed by coumadin clinic 05/25/16   Lelon Perla, MD    Physical Exam:   Vitals:   06/30/16 1600 06/30/16 1630 06/30/16 1700 06/30/16 1730  BP: 107/68  106/55 (!) 101/51  Pulse: 89 81 83 84  Resp: 17 17 14 15   Temp:      TempSrc:      SpO2: 90% 93% 92% 92%  Weight:      Height:         Physical Exam: Blood pressure (!) 101/51, pulse 84, temperature 102.6 F (39.2 C), temperature source Rectal, resp. rate 15, height 5\' 11"  (1.803 m), weight 86.2 kg (190 lb), SpO2 92 %. Gen: No acute distress. Head: Normocephalic, atraumatic. Eyes: Pupils equal, round and reactive to light. Extraocular movements intact.  Sclerae nonicteric. No lid lag. Mouth: Oropharynx reveals Moist mucous membranes. Dentition is good for age. Neck: Supple, no thyromegaly, no lymphadenopathy, no jugular venous distention. Chest: Lungs are clear to auscultation with good air movement. No rales, rhonchi or wheezes.  CV: Heart sounds are irregular. Grade 2/6 systolic ejection murmur. No rubs or gallops. Abdomen: Soft, nontender, nondistended with normal active bowel sounds. No hepatosplenomegaly or palpable masses. Extremities: Extremities are without clubbing, edema, or cyanosis. Pedal pulses 2+.  Skin: Warm and dry. No rashes, lesions or wounds. Neuro: Alert and oriented times 2; grossly nonfocal.  Psych: Insight is fair and judgment is appropriate. Mood and affect normal.   Data Review:    Labs: Basic Metabolic Panel:  Recent Labs Lab 06/30/16 1207  NA 135  K 3.9  CL 104  CO2 23  GLUCOSE 95  BUN 12  CREATININE 1.16  CALCIUM 8.1*   Liver Function Tests:  Recent Labs Lab 06/30/16 1207  AST 28  ALT 16*  ALKPHOS 31*  BILITOT 1.1  PROT 6.3*  ALBUMIN 3.3*   No results for input(s): LIPASE, AMYLASE in the last 168 hours. No results for input(s): AMMONIA in the last 168 hours. CBC:  Recent Labs Lab 06/30/16 1207  WBC 6.0  NEUTROABS 4.2  HGB 13.5  HCT 40.3  MCV 87.8  PLT 126*    Cardiac Enzymes: No results for input(s): CKTOTAL, CKMB, CKMBINDEX, TROPONINI in the last 168 hours.  BNP (last 3 results) No results for input(s): PROBNP in the last 8760 hours. CBG: No results for input(s): GLUCAP in the last 168 hours.  Urinalysis    Component Value Date/Time   COLORURINE YELLOW 06/30/2016 1337   APPEARANCEUR CLEAR 06/30/2016 1337   LABSPEC 1.019 06/30/2016 1337   PHURINE 6.0 06/30/2016 1337   GLUCOSEU NEGATIVE 06/30/2016 1337   HGBUR NEGATIVE 06/30/2016 1337   BILIRUBINUR NEGATIVE 06/30/2016 1337   KETONESUR 5 (A) 06/30/2016 1337   PROTEINUR 30 (A) 06/30/2016 1337  NITRITE NEGATIVE 06/30/2016 1337   LEUKOCYTESUR NEGATIVE 06/30/2016 1337      Radiographic Studies: Dg Chest 2 View  Result Date: 06/30/2016 CLINICAL DATA:  Cough, shortness of breath. EXAM: CHEST  2 VIEW COMPARISON:  Radiographs of August 09, 2011. FINDINGS: Stable cardiomediastinal silhouette. Atherosclerosis of thoracic aorta is noted. No pneumothorax or pleural effusion is noted. No acute pulmonary disease is noted. Bony thorax is unremarkable. IMPRESSION: No active cardiopulmonary disease. Electronically Signed   By: Marijo Conception, M.D.   On: 06/30/2016 12:55    EKG: Independently reviewed. Atrial fibrillation with a left bundle branch block. Ventricular rate 73 bpm. Compared to an EKG done on 01/27/16, the bundle branch block is new.   Assessment/Plan:   Principal Problem:   Influenza A with weakness/cough We'll observe overnight and initiate treatment with Tamiflu. Biggest symptom at this point is generalized weakness. Will obtain a physical therapy evaluation. Chest x-ray reviewed and as pictured below. No active cardiopulmonary disease appreciated.  Active Problems:   Chronic atrial fibrillation (HCC)/chronic anticoagulation The patient is currently rate controlled and on chronic blood thinners. Continue metoprolol and digoxin. Continue Coumadin.    HTN  (hypertension) Blood pressure controlled on metoprolol.    Left bundle branch block (LBBB) on electrocardiogram This appears to be new. Outpatient follow-up.    History of stroke Continue risk factor modification.  Other information:   DVT prophylaxis: Lovenox ordered. Code Status: Full code. Family Communication: Wife and daughter at bedside. Disposition Plan: Home 07/01/16 after physical therapy evaluation. Consults called: None. Admission status: Observation.  The medical decision making on this patient was of high complexity and the patient is at high risk for clinical deterioration, therefore this is a level 3 visit.  The medical decision making is of moderate complexity, therefore this is a level 2 visit.  Ayo Smoak [facility] Pager [phone] Cell: [phone]   If 7PM-7AM, please contact night-coverage [email or web address] Password [email or web address] 06/30/2016, 6:40 PM

## 2016-06-30 NOTE — ED Provider Notes (Signed)
[place] DEPT Provider Note   CSN: [id number] Arrival date & time: 06/30/16  1151     History   Chief Complaint Chief Complaint  Patient presents with  . Influenza  . Cough    HPI Wayne [name] is a 83 y.o. male.  HPI Patient's wife reports that he was very weak this morning. He could not get out of bed to dress. Yesterday the patient started to develop some cough with reported yellow sputum. They did not document fever at home. No vomiting or diarrhea. Patient has had very little to eat or drink over the past 12 hours. His wife reports he only ate a few crackers yesterday evening and this morning. Patient denies chest pain, headache or abdominal pain. Past Medical History:  Diagnosis Date  . Arthritis   . Atrial fibrillation (HCC)    Chronic  . BPH (benign prostatic hyperplasia)   . CAD (coronary artery disease)   . Chronic anticoagulation   . History of chicken pox   . HTN (hypertension)   . Old MI (myocardial infarction) 2007   s/p PCI to distal OM (no stent)  . 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 Influenza A 06/30/2016  . Left bundle branch block (LBBB) on electrocardiogram 06/30/2016  . History of stroke 06/30/2016  . Generalized weakness 06/30/2016  . Osteoarthritis 04/04/2016  . Encounter for therapeutic drug monitoring 08/06/2013  . Cough 08/09/2011  . Chronic anticoagulation   . HTN (hypertension)   . CAD (coronary artery disease)   . Chronic atrial fibrillation ([place]) 09/13/2010  . Unspecified cerebral artery occlusion with cerebral infarction 09/13/2010    Past Surgical History:  Procedure Laterality Date  . CORONARY ANGIOPLASTY  2007   Distal OM  . PROSTATE BIOPSY         Home Medications    Prior to Admission medications   Medication Sig Start Date End Date Taking? Authorizing Provider  acetaminophen (TYLENOL) 500 MG tablet Take 500 mg by mouth daily as needed (pain).   Yes  Historical Provider, MD  digoxin (LANOXIN) 0.125 MG tablet Take 1 tablet (125 mcg total) by mouth daily. 03/09/16  Yes Lelon Perla, MD  Glucosamine HCl 1500 MG TABS Take 1,500 mg by mouth 2 (two) times daily.   Yes Historical Provider, MD  guaiFENesin (MUCINEX) 600 MG 12 hr tablet Take 600 mg by mouth 2 (two) times daily as needed for cough or to loosen phlegm.    Yes Historical Provider, MD  metoprolol tartrate (LOPRESSOR) 25 MG tablet TAKE ONE TABLET BY MOUTH IN THE MORNING AND ONE-HALF IN THE EVENING Patient taking differently: Take 12.5-25 mg by mouth See admin instructions. Take 1 tablet (25 mg) by mouth every morning and 1/2 tablet (12.5 mg) at night 03/09/16  Yes Lelon Perla, MD  polyvinyl alcohol (ARTIFICIAL TEARS) 1.4 % ophthalmic solution Place 1 drop into both eyes daily.   Yes Historical Provider, MD  Saw Palmetto, Serenoa repens, (SAW PALMETTO BERRY PO) Take 1 tablet by mouth 2 (two) times daily.    Yes Historical Provider, MD  triamcinolone (KENALOG) 0.1 % cream Apply 1 application topically 2 (two) times daily as needed (itching). As directed by P.A. Lennie Odor with Dr. Allyson Sabal    Yes Historical Provider, MD  warfarin (COUMADIN) 2.5 MG tablet Take 1 to 1.5 tablets by mouth daily as directed by coumadin clinic Patient taking differently: Take 2.5-5 mg by mouth See admin  instructions. Take 1 tablet (2.5 mg) by mouth on Tuesday and Saturday nights, take 2 tablets (5 mg) on Sunday, Monday, Wednesday, Thursday, Friday nights or as directed by coumadin clinic 05/25/16  Yes Lelon Perla, MD  diclofenac sodium (VOLTAREN) 1 % GEL Apply 2 g topically 4 (four) times daily. Patient not taking: Reported on 06/30/2016 04/04/16   [place], FNP    Family History Family History  Problem Relation Age of Onset  . Heart disease Mother   . Heart attack Mother   . Heart disease Father   . Heart attack Father   . Heart attack Brother   . Heart attack Sister   . Stroke Brother      Social History Social History  Substance Use Topics  . Smoking status: Never Smoker  . Smokeless tobacco: Never Used  . Alcohol use No     Allergies   Procaine hcl; Tylenol [acetaminophen]; and Penicillins   Review of Systems Review of Systems 10 Systems reviewed and are negative for acute change except as noted in the HPI.   Physical Exam Updated Vital Signs BP 112/66 (BP Location: Left Arm)   Pulse 84   Temp 97.8 F (36.6 C) (Oral)   Resp 16   Ht 5\' 11"  (1.803 m)   Wt 193 lb 11.2 oz (87.9 kg)   SpO2 97%   BMI 27.02 kg/m   Physical Exam  Constitutional: He appears well-developed and well-nourished.  Patient is fatigued in appearance. He is somewhat slow to respond but does answer questions appropriately. No respiratory distress at rest.  HENT:  Head: Normocephalic and atraumatic.  Nose: Nose normal.  Mucous membranes slightly dry.  Eyes: Conjunctivae and EOM are normal. Pupils are equal, round, and reactive to light.  Neck: Neck supple.  Cardiovascular: Normal rate.   3\6 systolic ejection murmur.  Pulmonary/Chest: Effort normal.  Intermittent moist cough. Rales at the bases.  Abdominal: Soft. Bowel sounds are normal. He exhibits no distension. There is no tenderness. There is no guarding.  Musculoskeletal: Normal range of motion. He exhibits no edema, tenderness or deformity.  Neurological:  Patient is awake and appropriate but does seem to refer to rest with his eyes closed. No focal motor deficit.  Skin: Skin is warm and dry. No rash noted.  Psychiatric: He has a normal mood and affect.     ED Treatments / Results  Labs (all labs ordered are listed, but only abnormal results are displayed) Labs Reviewed  URINE CULTURE - Abnormal; Notable for the following:       Result Value   Culture MULTIPLE SPECIES PRESENT, SUGGEST RECOLLECTION (*)    All other components within normal limits  COMPREHENSIVE METABOLIC PANEL - Abnormal; Notable for the  following:    Calcium 8.1 (*)    Total Protein 6.3 (*)    Albumin 3.3 (*)    ALT 16 (*)    Alkaline Phosphatase 31 (*)    GFR calc non Af Amer 56 (*)    All other components within normal limits  CBC WITH DIFFERENTIAL/PLATELET - Abnormal; Notable for the following:    Platelets 126 (*)    All other components within normal limits  URINALYSIS, ROUTINE W REFLEX MICROSCOPIC - Abnormal; Notable for the following:    Ketones, ur 5 (*)    Protein, ur 30 (*)    All other components within normal limits  INFLUENZA PANEL BY PCR (TYPE A & B, H1N1) - Abnormal; Notable for the following:  Influenza A By PCR POSITIVE (*)    All other components within normal limits  BRAIN NATRIURETIC PEPTIDE - Abnormal; Notable for the following:    B Natriuretic Peptide 195.3 (*)    All other components within normal limits  PROTIME-INR - Abnormal; Notable for the following:    Prothrombin Time 19.4 (*)    All other components within normal limits  CBC - Abnormal; Notable for the following:    Hemoglobin 12.9 (*)    HCT 38.5 (*)    RDW 15.6 (*)    Platelets 119 (*)    All other components within normal limits  PROTIME-INR - Abnormal; Notable for the following:    Prothrombin Time 19.0 (*)    All other components within normal limits  CBC - Abnormal; Notable for the following:    Platelets 119 (*)    All other components within normal limits  PROTIME-INR - Abnormal; Notable for the following:    Prothrombin Time 19.2 (*)    All other components within normal limits  CULTURE, BLOOD (ROUTINE X 2)  CULTURE, BLOOD (ROUTINE X 2)  I-STAT CG4 LACTIC ACID, ED  I-STAT TROPOININ, ED  I-STAT CG4 LACTIC ACID, ED    EKG  EKG Interpretation  Date/Time:  Friday June 30 2016 12:57:50 EST Ventricular Rate:  73 PR Interval:    QRS Duration: 128 QT Interval:  363 QTC Calculation: 400 R Axis:   51 Text Interpretation:  Atrial fibrillation Left bundle branch block agree. no sig change from previous  Confirmed by Johnney Killian, MD, Jeannie Done [phone]) on 06/30/2016 2:34:24 PM       Radiology Dg Chest Port 1 View  Result Date: 07/02/2016 CLINICAL DATA:  Influenza. EXAM: PORTABLE CHEST 1 VIEW COMPARISON:  Two-view chest x-ray 06/30/2016 FINDINGS: The heart size is normal. Atherosclerotic calcifications are present at the arch. New left upper lobe pneumonia is present. Bilateral lower lobe airspace disease has increased. Chronic interstitial coarsening is again seen. IMPRESSION: 1. New left upper lobe pneumonia. 2. Name bilateral lower lobe airspace disease may reflect infection as well. 3. Aortic atherosclerosis. 4. Emphysema. Electronically Signed   By: San Morelle M.D.   On: 07/02/2016 14:00    Procedures Procedures (including critical care time)  Medications Ordered in ED Medications  digoxin (LANOXIN) tablet 125 mcg (125 mcg Oral Given 07/02/16 1021)  metoprolol tartrate (LOPRESSOR) tablet 12.5 mg (12.5 mg Oral Given 07/02/16 1021)  guaiFENesin (MUCINEX) 12 hr tablet 600 mg (not administered)  sodium chloride flush (NS) 0.9 % injection 3 mL (3 mLs Intravenous Given 07/02/16 1022)  sodium chloride flush (NS) 0.9 % injection 3 mL (not administered)  0.9 %  sodium chloride infusion (not administered)  ibuprofen (ADVIL,MOTRIN) tablet 400 mg (not administered)  ondansetron (ZOFRAN) tablet 4 mg (not administered)    Or  ondansetron (ZOFRAN) injection 4 mg (not administered)  oseltamivir (TAMIFLU) capsule 30 mg (30 mg Oral Given 07/02/16 1021)  Warfarin - Pharmacist Dosing Inpatient ( Does not apply Given 07/01/16 1800)  polyvinyl alcohol (LIQUIFILM TEARS) 1.4 % ophthalmic solution 1 drop (not administered)  acetaminophen (TYLENOL) tablet 650 mg (650 mg Oral Given 07/02/16 0838)  warfarin (COUMADIN) tablet 10 mg (not administered)  acetaminophen (TYLENOL) tablet 1,000 mg (1,000 mg Oral Given 06/30/16 1504)  sodium chloride 0.9 % bolus 500 mL (0 mLs Intravenous Stopped 06/30/16 1556)  0.9 %   sodium chloride infusion ( Intravenous Stopped 06/30/16 2011)  oseltamivir (TAMIFLU) capsule 30 mg (30 mg Oral Given 06/30/16 1649)  warfarin (  COUMADIN) tablet 7.5 mg (7.5 mg Oral Given 06/30/16 2347)  acetaminophen (TYLENOL) tablet 650 mg (650 mg Oral Given 07/01/16 0321)  warfarin (COUMADIN) tablet 7.5 mg (7.5 mg Oral Given 07/01/16 1745)     Initial Impression / Assessment and Plan / ED Course  I have reviewed the triage vital signs and the nursing notes.  Pertinent labs & imaging results that were available during my care of the patient were reviewed by me and considered in my medical decision making (see chart for details).  Clinical Course     Final Clinical Impressions(s) / ED Diagnoses   Final diagnoses:  Influenza   Patient presents with severe general weakness and new onset cough. The patient does have influenza A. After hydration we did attempt ambulation and independent activity. Patient remained very weak and requiring much assistance for minimal activity. New Prescriptions Current Discharge Medication List       Charlesetta Shanks, MD 07/02/16 1546

## 2016-06-30 NOTE — ED Triage Notes (Signed)
Pt from home by [facility] with Flu like symptoms that started last night and progressed this morning. Pt has a 99.8 temp. Pt feels weaker and has a productive cough.

## 2016-06-30 NOTE — Progress Notes (Addendum)
ANTICOAGULATION CONSULT NOTE - Initial Consult  Pharmacy Consult for Coumadin Indication: atrial fibrillation  Allergies  Allergen Reactions  . Procaine Hcl Other (See Comments)    Passed out after being given this at dental appointment  . Tylenol [Acetaminophen] Other (See Comments)    Makes him very sleepy if he takes more than one tablet  . Penicillins Rash    Has patient had a PCN reaction causing immediate rash, facial/tongue/throat swelling, SOB or lightheadedness with hypotension: Yes Has patient had a PCN reaction causing severe rash involving mucus membranes or skin necrosis: No Has patient had a PCN reaction that required hospitalization No Has patient had a PCN reaction occurring within the last 10 years: No If all of the above answers are "NO", then may proceed with Cephalosporin use.    Patient Measurements: Height: 5\' 11"  (180.3 cm) Weight: 190 lb (86.2 kg) IBW/kg (Calculated) : 75.3  Vital Signs: Temp: 102.6 F (39.2 C) (01/12 1500) Temp Source: Rectal (01/12 1500) BP: 101/51 (01/12 1730) Pulse Rate: 84 (01/12 1730)  Labs:  Recent Labs  06/30/16 1207  HGB 13.5  HCT 40.3  PLT 126*  CREATININE 1.16    Estimated Creatinine Clearance: 50.5 mL/min (by C-G formula based on SCr of 1.16 mg/dL).   Medical History: Past Medical History:  Diagnosis Date  . Arthritis   . Atrial fibrillation (HCC)    Chronic  . BPH (benign prostatic hyperplasia)   . CAD (coronary artery disease)   . Chronic anticoagulation   . History of chicken pox   . HTN (hypertension)   . Old MI (myocardial infarction) 2007   s/p PCI to distal OM (no stent)  . 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 Found to have the flu. Pharmacy consulted to continue Coumadin 5mg  daily exc 2.5mg  on Tues/Sat PTA for Afib. Last INR check was on 1/5 and slightly low. Hgb stable and plts low at 126.  Goal of Therapy:  INR 2-3 Monitor platelets by  anticoagulation protocol: Yes   Plan:  Check stat INR tonight before giving a dose Monitor daily INR, CBC, s/s of bleed   ADDENDUM:  INR is low at 1.61 today. Last dose was given on 1/11. Will give a higher dose tonight as INR remains low from anticoag visit on 1/5.  Plan:  Give Coumadin 7.5mg  PO x 1 tonight Monitor daily INR, CBC, s/s of bleed  Elenor Quinones, PharmD, BCPS Clinical Pharmacist Pager [phone] 06/30/2016 10:30 PM

## 2016-06-30 NOTE — ED Notes (Signed)
Assisted pt back into bed. 2 max assist required to ambulate.

## 2016-06-30 NOTE — Telephone Encounter (Signed)
Returned call to wife (ok per DPR).  No answer, lmtcb.

## 2016-06-30 NOTE — Telephone Encounter (Signed)
New message   Wife calling C/O changing in husband behavior.  H/O CVA 11 year ago . am medication was given.   No chest pain,  No sob.   PCP was not contacted  Pt C/O BP issue: STAT if pt C/O blurred vision, one-sided weakness or slurred speech  1. What are your last 5 BP readings? 125/85 this am   2. Are you having any other symptoms (ex. Dizziness, headache, blurred vision, passed out)? frowning   3. What is your BP issue? Wants to discuss with nurse. Previous pt of Dr. Mare Ferrari

## 2016-06-30 NOTE — ED Notes (Signed)
Attempted report x 2 

## 2016-07-01 DIAGNOSIS — R05 Cough: Secondary | ICD-10-CM | POA: Diagnosis not present

## 2016-07-01 DIAGNOSIS — J101 Influenza due to other identified influenza virus with other respiratory manifestations: Secondary | ICD-10-CM | POA: Diagnosis not present

## 2016-07-01 DIAGNOSIS — Z7901 Long term (current) use of anticoagulants: Secondary | ICD-10-CM | POA: Diagnosis not present

## 2016-07-01 DIAGNOSIS — I482 Chronic atrial fibrillation: Secondary | ICD-10-CM | POA: Diagnosis not present

## 2016-07-01 DIAGNOSIS — J111 Influenza due to unidentified influenza virus with other respiratory manifestations: Secondary | ICD-10-CM | POA: Diagnosis not present

## 2016-07-01 LAB — URINE CULTURE

## 2016-07-01 LAB — CBC
HCT: 38.5 % — ABNORMAL LOW (ref 39.0–52.0)
Hemoglobin: 12.9 g/dL — ABNORMAL LOW (ref 13.0–17.0)
MCH: 29.7 pg (ref 26.0–34.0)
MCHC: 33.5 g/dL (ref 30.0–36.0)
MCV: 88.5 fL (ref 78.0–100.0)
PLATELETS: 119 10*3/uL — AB (ref 150–400)
RBC: 4.35 MIL/uL (ref 4.22–5.81)
RDW: 15.6 % — AB (ref 11.5–15.5)
WBC: 5.5 10*3/uL (ref 4.0–10.5)

## 2016-07-01 LAB — PROTIME-INR
INR: 1.58
PROTHROMBIN TIME: 19 s — AB (ref 11.4–15.2)

## 2016-07-01 MED ORDER — POLYVINYL ALCOHOL 1.4 % OP SOLN
1.0000 [drp] | OPHTHALMIC | Status: DC | PRN
Start: 2016-07-01 — End: 2016-07-04
  Filled 2016-07-01: qty 15

## 2016-07-01 MED ORDER — ACETAMINOPHEN 325 MG PO TABS
650.0000 mg | ORAL_TABLET | Freq: Once | ORAL | Status: AC
  Administered 2016-07-01: 650 mg via ORAL
  Filled 2016-07-01: qty 2

## 2016-07-01 MED ORDER — WARFARIN SODIUM 7.5 MG PO TABS
7.5000 mg | ORAL_TABLET | Freq: Once | ORAL | Status: AC
  Administered 2016-07-01: 7.5 mg via ORAL
  Filled 2016-07-01: qty 1

## 2016-07-01 NOTE — Progress Notes (Signed)
[place] for Coumadin Indication: atrial fibrillation  Allergies  Allergen Reactions  . Procaine Hcl Other (See Comments)    Passed out after being given this at dental appointment  . Tylenol [Acetaminophen] Other (See Comments)    Makes him very sleepy if he takes more than one tablet  . Penicillins Rash    Has patient had a PCN reaction causing immediate rash, facial/tongue/throat swelling, SOB or lightheadedness with hypotension: Yes Has patient had a PCN reaction causing severe rash involving mucus membranes or skin necrosis: No Has patient had a PCN reaction that required hospitalization No Has patient had a PCN reaction occurring within the last 10 years: No If all of the above answers are "NO", then may proceed with Cephalosporin use.    Patient Measurements: Height: 5\' 11"  (180.3 cm) Weight: 193 lb 11.2 oz (87.9 kg) IBW/kg (Calculated) : 75.3  Vital Signs: Temp: 98.7 F (37.1 C) (01/13 0908) Temp Source: Oral (01/13 0908) BP: 123/60 (01/13 0540) Pulse Rate: 89 (01/13 0540)  Labs:  Recent Labs  06/30/16 1207 06/30/16 2133 07/01/16 0600  HGB 13.5  --  12.9*  HCT 40.3  --  38.5*  PLT 126*  --  119*  LABPROT  --  19.4* 19.0*  INR  --  1.61 1.58  CREATININE 1.16  --   --     Estimated Creatinine Clearance: 50.5 mL/min (by C-G formula based on SCr of 1.16 mg/dL).   Medical History: Past Medical History:  Diagnosis Date  . Arthritis   . Atrial fibrillation (HCC)    Chronic  . BPH (benign prostatic hyperplasia)   . CAD (coronary artery disease)   . Chronic anticoagulation   . History of chicken pox   . HTN (hypertension)   . Old MI (myocardial infarction) 2007   s/p PCI to distal OM (no stent)  . 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 Found to have the flu. Pharmacy consulted to continue Coumadin 5mg  daily exc 2.5mg  on Tues/Sat PTA for Afib. Last INR check was on 1/5 and  slightly low.   INR continues to be low at 1.58  Goal of Therapy:  INR 2-3 Monitor platelets by anticoagulation protocol: Yes   Plan:  Repeat Coumadin 7.5 mg po X 1 tonight Daily INR  If home today, recommend increase in home dose to 5 mg po daily  Thank you Anette Guarneri, PharmD [phone] 07/01/2016 10:52 AM

## 2016-07-01 NOTE — Progress Notes (Addendum)
Progress Note    Nayshaun Schuth  [id number] DOB: Nov 16, 1929  DOA: 06/30/2016 PCP: Mauricio Po, FNP    Brief Narrative:   Chief complaint: Follow-up influenza  Juquan Caporusso is an 83 y.o. male with a PMH of prior stroke, chronic atrial fibrillation and hypertension who was admitted 06/30/16 with chief complaint of significant weakness.  Assessment/Plan:   Principal Problem:   Influenza A with weakness/cough and delirium We'll observe overnight and initiate treatment with Tamiflu. Biggest symptom at this point is generalized weakness. PT recommended home therapy. He lives with his elderly, frail wife. Chest x-ray reviewed and as pictured below. No active cardiopulmonary disease appreciated. Given reports of confusion/delirium, would observe another 24 hours.   Active Problems:   Chronic atrial fibrillation (HCC)/chronic anticoagulation The patient is currently rate controlled and on chronic blood thinners. Continue metoprolol and digoxin. Continue Coumadin.    HTN (hypertension) Blood pressure controlled on metoprolol.    Left bundle branch block (LBBB) on electrocardiogram This appears to be new. Outpatient follow-up.    History of stroke Continue risk factor modification.  Family Communication/Anticipated D/C date and plan/Code Status   DVT prophylaxis: Coumadin ordered. Code Status: Full Code.  Family Communication: Daughter updated the bedside. Disposition Plan: Hopefully home 07/02/16 if a little stronger.   Medical Consultants:    None.   Procedures:    None  Anti-Infectives:    Tamiflu 06/30/16--->  Subjective:   Nursing staff reports that he was confused this morning. Ambulated the patient and he is very weak. Denies dyspnea. He remains febrile.  Objective:    Vitals:   07/01/16 0254 07/01/16 0426 07/01/16 0540 07/01/16 0908  BP: (!) 109/54 133/71 123/60   Pulse: 85 (!) 102 89   Resp: (!) 26 (!) 22    Temp: (!)  101.1 F (38.4 C) 99.8 F (37.7 C) 100.1 F (37.8 C) 98.7 F (37.1 C)  TempSrc: Oral Oral Oral Oral  SpO2: 92% 96% 96%   Weight:      Height:        Intake/Output Summary (Last 24 hours) at 07/01/16 0924 Last data filed at 06/30/16 2216  Gross per 24 hour  Intake              500 ml  Output              100 ml  Net              400 ml   Filed Weights   06/30/16 1200 06/30/16 2121  Weight: 86.2 kg (190 lb) 87.9 kg (193 lb 11.2 oz)    Exam: General exam: Appears calm and comfortable.  Respiratory system: Clear to auscultation. Respiratory effort normal. Cardiovascular system: HSIR. No JVD,  rubs, gallops or clicks. II/VI murmur. Gastrointestinal system: Abdomen is nondistended, soft and nontender. No organomegaly or masses felt. Normal bowel sounds heard. Central nervous system: Alert and oriented x 2. No focal neurological deficits. Extremities: No clubbing,  or cyanosis. No edema. Skin: No rashes, lesions or ulcers. Psychiatry: Judgement and insight appear mildly impaired. Mood & affect appropriate.   Data Reviewed:   I have personally reviewed following labs and imaging studies:  Labs: Basic Metabolic Panel:  Recent Labs Lab 06/30/16 1207  NA 135  K 3.9  CL 104  CO2 23  GLUCOSE 95  BUN 12  CREATININE 1.16  CALCIUM 8.1*   GFR Estimated Creatinine Clearance: 50.5 mL/min (by C-G formula based on SCr of 1.16  mg/dL). Liver Function Tests:  Recent Labs Lab 06/30/16 1207  AST 28  ALT 16*  ALKPHOS 31*  BILITOT 1.1  PROT 6.3*  ALBUMIN 3.3*   No results for input(s): LIPASE, AMYLASE in the last 168 hours. No results for input(s): AMMONIA in the last 168 hours. Coagulation profile  Recent Labs Lab 06/30/16 2133 07/01/16 0600  INR 1.61 1.58    CBC:  Recent Labs Lab 06/30/16 1207 07/01/16 0600  WBC 6.0 5.5  NEUTROABS 4.2  --   HGB 13.5 12.9*  HCT 40.3 38.5*  MCV 87.8 88.5  PLT 126* PENDING   Sepsis Labs:  Recent Labs Lab 06/30/16 1207  06/30/16 1233 06/30/16 1809 07/01/16 0600  WBC 6.0  --   --  5.5  LATICACIDVEN  --  1.38 1.28  --     Microbiology No results found for this or any previous visit (from the past 240 hour(s)).  Radiology: Dg Chest 2 View  Result Date: 06/30/2016 CLINICAL DATA:  Cough, shortness of breath. EXAM: CHEST  2 VIEW COMPARISON:  Radiographs of August 09, 2011. FINDINGS: Stable cardiomediastinal silhouette. Atherosclerosis of thoracic aorta is noted. No pneumothorax or pleural effusion is noted. No acute pulmonary disease is noted. Bony thorax is unremarkable. IMPRESSION: No active cardiopulmonary disease. Electronically Signed   By: Marijo Conception, M.D.   On: 06/30/2016 12:55    Medications:   . digoxin  125 mcg Oral Daily  . metoprolol tartrate  12.5 mg Oral BID  . oseltamivir  30 mg Oral BID  . sodium chloride flush  3 mL Intravenous Q12H  . Warfarin - Pharmacist Dosing Inpatient   Does not apply q1800   Continuous Infusions:  Medical decision making is of Moderate complexity and this patient is at moderate risk of deterioration, therefore this is a level 2 visit. (> 4 problem points, 2 data points, Moderate risk)   Problems/DDx Points   Self limited or minor (max 2)       1   Established problem, stable       1   Established problem, worsening       2   New problem, no additional W/U planned (max 1)       3   New problem, additional W/U planned        4    Data Reviewed Points   Review/order clinical lab tests       1   Review/order x-rays       1   Review/order tests (Echo, EKG, PFTs, etc)       1   Discussion of test results w/ performing MD       1   Independent review of image, tracing or specimen       2   Decision to obtain old records       1   Review and summation of old records       2    Level of risk Presenting prob Diagnostics Management   Minimal 1 self limited/minor Labs CXR EKG/EEG U/A U/S Rest Gargles Bandages Dressings   Low 2 or more self  limited/minor 1 stable chronic Acute uncomplicated illness Tests (PFTS) Non-CV imaging Arterial labs Biopsies of skin OTC drugs Minor surgery-no risk PT OT IVF without additives    Moderate 1 or more chronic illnesses w/ mild exac, progression or S/E from tx 2 or more stable chronic illnesses Undiagnosed new problem w/ uncertain prognosis Acute complicated injury  Stress tests Endoscopies  with no risk factors Deep needle or incisional bx CV imaging without risk LP Thoracentesis Paracentesis Minor surgery w/ risks Elective major surgery w/ no risk (open, percutaneous or endoscopic) Prescription drugs Therapeutic nucl med IVF with additives Closed tx of fracture/dislocation    High Severe exac of chronic illness Acute or chronic illness/injury may pose a threat to life or bodily function (ARF) Change in neuro status    CV imaging w/ contrast and risk Cardio electophysiologic tests Endoscopies w/ risk Discography Elective major surgery Emergency major surgery Parenteral controlled substances Drug therapy req monitoring for toxicity DNR/de-escalation of care    MDM Prob points Data points Risk   Straightforward    <1    <1    Min   Low complexity    2    2    Low   Moderate    3    3    Mod   High Complexity    4 or more    4 or more    High      LOS: 0 days   RAMA,CHRISTINA  [facility] Pager [phone]. If unable to reach me by pager, please call my cell phone at [phone].  *Please refer to [email or web address], password [email or web address] to get updated schedule on who will round on this patient, as hospitalists switch teams weekly. If 7PM-7AM, please contact night-coverage at [email or web address], password [email or web address] for any overnight needs.  07/01/2016, 9:24 AM

## 2016-07-01 NOTE — Evaluation (Signed)
Physical Therapy Evaluation Patient Details Name: Wayne [name] MRN: [id number] DOB: October 16, 1929 Today's Date: 07/01/2016   History of Present Illness  83 y.o. male admitted with influenza A. PMH consists of CAD, hypertension, prior MI, atrial fibrillation on chronic Coumadin, and stroke.   Clinical Impression  Pt admitted with above diagnosis. Pt currently with functional limitations due to the deficits listed below (see PT Problem List). On eval, pt required min assist for bed mobility, min assist transfers and min guard assist ambulation. Pt will benefit from skilled PT to increase their independence and safety with mobility to allow discharge to the venue listed below.  Pt would benefit from additional day in acute care to further progress mobility with nursing and PT. He lives in a multi-level house with his 65 year old wife.     Follow Up Recommendations Home health PT;Supervision for mobility/OOB    Equipment Recommendations  None recommended by PT    Recommendations for Other Services       Precautions / Restrictions Precautions Precautions: Fall      Mobility  Bed Mobility Overal bed mobility: Needs Assistance Bed Mobility: Supine to Sit;Sit to Supine     Supine to sit: Min assist Sit to supine: Min guard   General bed mobility comments: no rail, HOB flat, assist to elevate trunk  Transfers Overall transfer level: Needs assistance Equipment used: Rolling walker (2 wheeled) Transfers: Sit to/from Stand Sit to Stand: Min assist         General transfer comment: verbal cues for hand placement, assist to power up  Ambulation/Gait Ambulation/Gait assistance: Min guard Ambulation Distance (Feet): 120 Feet Assistive device: Rolling walker (2 wheeled) Gait Pattern/deviations: Step-through pattern;Decreased stride length Gait velocity: decreased Gait velocity interpretation: Below normal speed for age/gender General Gait Details: RW needed for support.  Slow, steady gait with RW. Pt ambulated on RA with O2 sats at 91-93%.  Stairs            Wheelchair Mobility    Modified Rankin (Stroke Patients Only)       Balance Overall balance assessment: Needs assistance Sitting-balance support: No upper extremity supported;Feet supported Sitting balance-Leahy Scale: Good     Standing balance support: Bilateral upper extremity supported;During functional activity Standing balance-Leahy Scale: Fair                               Pertinent Vitals/Pain Pain Assessment: No/denies pain    Home Living Family/patient expects to be discharged to:: Private residence Living Arrangements: Spouse/significant other Available Help at Discharge: Family;Available 24 hours/day Type of Home: House Home Access: Stairs to enter Entrance Stairs-Rails: None Entrance Stairs-Number of Steps: 1 Home Layout: Multi-level;Bed/bath upstairs Home Equipment: Walker - 2 wheels (from stroke 11 years ago)      Prior Function Level of Independence: Independent         Comments: Active. Walks for exercise 30 min/day. Drives.     Hand Dominance        Extremity/Trunk Assessment   Upper Extremity Assessment Upper Extremity Assessment: Generalized weakness    Lower Extremity Assessment Lower Extremity Assessment: Generalized weakness    Cervical / Trunk Assessment Cervical / Trunk Assessment: Kyphotic  Communication   Communication: No difficulties  Cognition Arousal/Alertness: Awake/alert Behavior During Therapy: WFL for tasks assessed/performed Overall Cognitive Status: Within Functional Limits for tasks assessed  General Comments      Exercises     Assessment/Plan    PT Assessment Patient needs continued PT services  PT Problem List Decreased strength;Decreased activity tolerance;Decreased balance;Decreased knowledge of use of DME;Decreased mobility          PT Treatment Interventions  DME instruction;Gait training;Stair training;Functional mobility training;Balance training;Therapeutic exercise;Therapeutic activities;Patient/family education    PT Goals (Current goals can be found in the Care Plan section)  Acute Rehab PT Goals Patient Stated Goal: get stronger PT Goal Formulation: With patient/family Time For Goal Achievement: 07/08/16 Potential to Achieve Goals: Good    Frequency Min 3X/week   Barriers to discharge        Co-evaluation               End of Session Equipment Utilized During Treatment: Gait belt Activity Tolerance: Patient tolerated treatment well Patient left: in bed;with call bell/phone within reach;with family/visitor present Nurse Communication: Mobility status    Functional Assessment Tool Used: clinical judgement Functional Limitation: Mobility: Walking and moving around Mobility: Walking and Moving Around Current Status [phone]): At least 20 percent but less than 40 percent impaired, limited or restricted Mobility: Walking and Moving Around Goal Status [phone]): At least 1 percent but less than 20 percent impaired, limited or restricted    Time: [id number] PT Time Calculation (min) (ACUTE ONLY): 36 min   Charges:   PT Evaluation $PT Eval Moderate Complexity: 1 Procedure PT Treatments $Gait Training: 8-22 mins   PT G Codes:   PT G-Codes **NOT FOR INPATIENT CLASS** Functional Assessment Tool Used: clinical judgement Functional Limitation: Mobility: Walking and moving around Mobility: Walking and Moving Around Current Status [id number]): At least 20 percent but less than 40 percent impaired, limited or restricted Mobility: Walking and Moving Around Goal Status [phone]): At least 1 percent but less than 20 percent impaired, limited or restricted    [name] 07/01/2016, 1:03 PM

## 2016-07-01 NOTE — Progress Notes (Signed)
Noted pt.is trying to get up & confused ;unable to tell his name or where he is.V/S taken-t=101.1;B/P=109/54;HR= 85[r=26;o2 sat =92 on RA.;Rapid response nurse was made aware & MD on call & ordered Tylenol 650 mg.Will continue to monitor pt.

## 2016-07-01 NOTE — Progress Notes (Signed)
Admitted pt. From ED AAO X 4;no acute distress noted.V/S taken & recorded.IV in place with occlussive dsd.intact.no redness noted.Oriented pt. to the room ,& call bell.Fall assessment completed w/ pt.& able to verbalized  Understanding to call nurse before getting out of bed.Call light w/in reach.Skin dry & with some echymosis noted on arms & legs.Will continue to monitor pt.Pt.was placed on droplet precaution.

## 2016-07-02 ENCOUNTER — Observation Stay (HOSPITAL_COMMUNITY): Payer: Medicare HMO

## 2016-07-02 DIAGNOSIS — I10 Essential (primary) hypertension: Secondary | ICD-10-CM | POA: Diagnosis not present

## 2016-07-02 DIAGNOSIS — Z79899 Other long term (current) drug therapy: Secondary | ICD-10-CM | POA: Diagnosis not present

## 2016-07-02 DIAGNOSIS — N401 Enlarged prostate with lower urinary tract symptoms: Secondary | ICD-10-CM | POA: Diagnosis not present

## 2016-07-02 DIAGNOSIS — Z8249 Family history of ischemic heart disease and other diseases of the circulatory system: Secondary | ICD-10-CM | POA: Diagnosis not present

## 2016-07-02 DIAGNOSIS — E876 Hypokalemia: Secondary | ICD-10-CM | POA: Diagnosis not present

## 2016-07-02 DIAGNOSIS — J181 Lobar pneumonia, unspecified organism: Secondary | ICD-10-CM | POA: Diagnosis not present

## 2016-07-02 DIAGNOSIS — I2510 Atherosclerotic heart disease of native coronary artery without angina pectoris: Secondary | ICD-10-CM | POA: Diagnosis not present

## 2016-07-02 DIAGNOSIS — Z823 Family history of stroke: Secondary | ICD-10-CM | POA: Diagnosis not present

## 2016-07-02 DIAGNOSIS — Z886 Allergy status to analgesic agent status: Secondary | ICD-10-CM | POA: Diagnosis not present

## 2016-07-02 DIAGNOSIS — Z880 Allergy status to penicillin: Secondary | ICD-10-CM | POA: Diagnosis not present

## 2016-07-02 DIAGNOSIS — J189 Pneumonia, unspecified organism: Secondary | ICD-10-CM | POA: Diagnosis present

## 2016-07-02 DIAGNOSIS — I252 Old myocardial infarction: Secondary | ICD-10-CM | POA: Diagnosis not present

## 2016-07-02 DIAGNOSIS — Z7901 Long term (current) use of anticoagulants: Secondary | ICD-10-CM | POA: Diagnosis not present

## 2016-07-02 DIAGNOSIS — R32 Unspecified urinary incontinence: Secondary | ICD-10-CM | POA: Diagnosis not present

## 2016-07-02 DIAGNOSIS — I482 Chronic atrial fibrillation: Secondary | ICD-10-CM | POA: Diagnosis not present

## 2016-07-02 DIAGNOSIS — Z8619 Personal history of other infectious and parasitic diseases: Secondary | ICD-10-CM | POA: Diagnosis not present

## 2016-07-02 DIAGNOSIS — Z9861 Coronary angioplasty status: Secondary | ICD-10-CM | POA: Diagnosis not present

## 2016-07-02 DIAGNOSIS — J1000 Influenza due to other identified influenza virus with unspecified type of pneumonia: Secondary | ICD-10-CM | POA: Diagnosis not present

## 2016-07-02 DIAGNOSIS — J101 Influenza due to other identified influenza virus with other respiratory manifestations: Secondary | ICD-10-CM | POA: Diagnosis not present

## 2016-07-02 DIAGNOSIS — Z8673 Personal history of transient ischemic attack (TIA), and cerebral infarction without residual deficits: Secondary | ICD-10-CM | POA: Diagnosis not present

## 2016-07-02 DIAGNOSIS — R338 Other retention of urine: Secondary | ICD-10-CM | POA: Diagnosis not present

## 2016-07-02 DIAGNOSIS — Z888 Allergy status to other drugs, medicaments and biological substances status: Secondary | ICD-10-CM | POA: Diagnosis not present

## 2016-07-02 DIAGNOSIS — R531 Weakness: Secondary | ICD-10-CM | POA: Diagnosis present

## 2016-07-02 DIAGNOSIS — I447 Left bundle-branch block, unspecified: Secondary | ICD-10-CM | POA: Diagnosis not present

## 2016-07-02 DIAGNOSIS — J111 Influenza due to unidentified influenza virus with other respiratory manifestations: Secondary | ICD-10-CM | POA: Diagnosis not present

## 2016-07-02 LAB — CBC
HCT: 40.8 % (ref 39.0–52.0)
HEMOGLOBIN: 13.8 g/dL (ref 13.0–17.0)
MCH: 29.6 pg (ref 26.0–34.0)
MCHC: 33.8 g/dL (ref 30.0–36.0)
MCV: 87.6 fL (ref 78.0–100.0)
PLATELETS: 119 10*3/uL — AB (ref 150–400)
RBC: 4.66 MIL/uL (ref 4.22–5.81)
RDW: 15.3 % (ref 11.5–15.5)
WBC: 7.6 10*3/uL (ref 4.0–10.5)

## 2016-07-02 LAB — PROTIME-INR
INR: 1.59
PROTHROMBIN TIME: 19.2 s — AB (ref 11.4–15.2)

## 2016-07-02 MED ORDER — ACETAMINOPHEN 325 MG PO TABS
650.0000 mg | ORAL_TABLET | Freq: Four times a day (QID) | ORAL | Status: DC | PRN
  Administered 2016-07-02 – 2016-07-03 (×4): 650 mg via ORAL
  Filled 2016-07-02 (×4): qty 2

## 2016-07-02 MED ORDER — WARFARIN SODIUM 5 MG PO TABS
10.0000 mg | ORAL_TABLET | Freq: Once | ORAL | Status: AC
  Administered 2016-07-02: 10 mg via ORAL
  Filled 2016-07-02: qty 2

## 2016-07-02 MED ORDER — LEVOFLOXACIN IN D5W 500 MG/100ML IV SOLN
500.0000 mg | INTRAVENOUS | Status: DC
  Administered 2016-07-02 – 2016-07-03 (×2): 500 mg via INTRAVENOUS
  Filled 2016-07-02 (×3): qty 100

## 2016-07-02 NOTE — Progress Notes (Signed)
Progress Note    Wayne [name]  [id number] DOB: 23-Apr-1930  DOA: 06/30/2016 PCP: Mauricio Po, FNP    Brief Narrative:   Chief complaint: Follow-up influenza  Wayne [name] is an 83 y.o. male with a PMH of prior stroke, chronic atrial fibrillation and hypertension who was admitted 06/30/16 with chief complaint of significant weakness.  Assessment/Plan:   Principal Problem:   Influenza A with weakness/cough and delirium No improvement despite Tamiflu. Remains a bit confused. Biggest symptom is generalized weakness, DOE. PT recommended home therapy. He lives with his elderly, frail wife. Chest x-ray reviewed and as pictured below. No active cardiopulmonary disease appreciated. Chest x-ray repeated secondary to patient reporting that he feels worse today. Films show a new left upper lobe pneumonia. Bilateral airspace disease also apparent. Levaquin per pharmacy.       Active Problems:   Chronic atrial fibrillation (HCC)/chronic anticoagulation The patient is currently rate controlled and on chronic blood thinners. Continue metoprolol and digoxin. Continue Coumadin.    HTN (hypertension) Blood pressure controlled on metoprolol.    Left bundle branch block (LBBB) on electrocardiogram This appears to be new. Outpatient follow-up.    History of stroke Continue risk factor modification.  Family Communication/Anticipated D/C date and plan/Code Status   DVT prophylaxis: Coumadin ordered. Code Status: Full Code.  Family Communication: Daughter updated the bedside. Disposition Plan: Stable for discharge yet, possibly another 24-48 hours.   Medical Consultants:    None.   Procedures:    None  Anti-Infectives:    Tamiflu 06/30/16--->  Levaquin 07/02/16--->  Subjective:   Patient tells me he feels worse today than he did yesterday. Very weak. No frank shortness of breath, but he does appear confused at times during my interaction with  him.  Objective:    Vitals:   07/01/16 2134 07/01/16 2155 07/02/16 0256 07/02/16 0626  BP: (!) 154/80   136/78  Pulse: 92     Resp: 18   18  Temp: 100.3 F (37.9 C) 99.8 F (37.7 C) 98.7 F (37.1 C) 98.1 F (36.7 C)  TempSrc: Oral Oral Oral Oral  SpO2: 92%   94%  Weight:      Height:        Intake/Output Summary (Last 24 hours) at 07/02/16 0921 Last data filed at 07/01/16 2100  Gross per 24 hour  Intake                0 ml  Output              950 ml  Net             -950 ml   Filed Weights   06/30/16 1200 06/30/16 2121  Weight: 86.2 kg (190 lb) 87.9 kg (193 lb 11.2 oz)    Exam: General exam: Appears calm and comfortable. Very weak. Respiratory system: A few bibasilar crackles. Respiratory effort normal at rest. Cardiovascular system: HSIR. No JVD,  rubs, gallops or clicks. II/VI SEM. Gastrointestinal system: Abdomen is nondistended, soft and nontender. No organomegaly or masses felt. Normal bowel sounds heard. Central nervous system: Alert and oriented x 2, but confusion apparent. No focal neurological deficits. Extremities: No clubbing,  or cyanosis. No edema. Skin: No rashes, lesions or ulcers. Psychiatry: Judgement and insight appear mildly impaired. Mood & affect appropriate.   Data Reviewed:   I have personally reviewed following labs and imaging studies:  Labs: Basic Metabolic Panel:  Recent Labs Lab 06/30/16 1207  NA 135  K 3.9  CL 104  CO2 23  GLUCOSE 95  BUN 12  CREATININE 1.16  CALCIUM 8.1*   GFR Estimated Creatinine Clearance: 50.5 mL/min (by C-G formula based on SCr of 1.16 mg/dL). Liver Function Tests:  Recent Labs Lab 06/30/16 1207  AST 28  ALT 16*  ALKPHOS 31*  BILITOT 1.1  PROT 6.3*  ALBUMIN 3.3*   No results for input(s): LIPASE, AMYLASE in the last 168 hours. No results for input(s): AMMONIA in the last 168 hours. Coagulation profile  Recent Labs Lab 06/30/16 2133 07/01/16 0600 07/02/16 0638  INR 1.61 1.58 1.59     CBC:  Recent Labs Lab 06/30/16 1207 07/01/16 0600 07/02/16 0638  WBC 6.0 5.5 7.6  NEUTROABS 4.2  --   --   HGB 13.5 12.9* 13.8  HCT 40.3 38.5* 40.8  MCV 87.8 88.5 87.6  PLT 126* 119* 119*   Sepsis Labs:  Recent Labs Lab 06/30/16 1207 06/30/16 1233 06/30/16 1809 07/01/16 0600 07/02/16 0638  WBC 6.0  --   --  5.5 7.6  LATICACIDVEN  --  1.38 1.28  --   --     Microbiology Recent Results (from the past 240 hour(s))  Culture, blood (Routine x 2)     Status: None (Preliminary result)   Collection Time: 06/30/16 12:00 PM  Result Value Ref Range Status   Specimen Description BLOOD LEFT FOREARM  Final   Special Requests BOTTLES DRAWN AEROBIC AND ANAEROBIC  10CC  Final   Culture NO GROWTH < 24 HOURS  Final   Report Status PENDING  Incomplete  Urine culture     Status: Abnormal   Collection Time: 06/30/16  1:37 PM  Result Value Ref Range Status   Specimen Description URINE, CLEAN CATCH  Final   Special Requests NONE  Final   Culture MULTIPLE SPECIES PRESENT, SUGGEST RECOLLECTION (A)  Final   Report Status 07/01/2016 FINAL  Final  Culture, blood (Routine x 2)     Status: None (Preliminary result)   Collection Time: 06/30/16  3:21 PM  Result Value Ref Range Status   Specimen Description BLOOD RIGHT ANTECUBITAL  Final   Special Requests BOTTLES DRAWN AEROBIC AND ANAEROBIC 5CC  Final   Culture NO GROWTH < 24 HOURS  Final   Report Status PENDING  Incomplete    Radiology: Dg Chest 2 View  Result Date: 06/30/2016 CLINICAL DATA:  Cough, shortness of breath. EXAM: CHEST  2 VIEW COMPARISON:  Radiographs of August 09, 2011. FINDINGS: Stable cardiomediastinal silhouette. Atherosclerosis of thoracic aorta is noted. No pneumothorax or pleural effusion is noted. No acute pulmonary disease is noted. Bony thorax is unremarkable. IMPRESSION: No active cardiopulmonary disease. Electronically Signed   By: Marijo Conception, M.D.   On: 06/30/2016 12:55    Medications:   . digoxin   125 mcg Oral Daily  . metoprolol tartrate  12.5 mg Oral BID  . oseltamivir  30 mg Oral BID  . sodium chloride flush  3 mL Intravenous Q12H  . Warfarin - Pharmacist Dosing Inpatient   Does not apply q1800   Continuous Infusions:  Medical decision making is of Moderate complexity and this patient is at moderate risk of deterioration, therefore this is a level 2 visit. (> 4 problem points, 3 data points, Moderate risk)   Problems/DDx Points   Self limited or minor (max 2)       1   Established problem, stable       1   Established problem,  worsening       2   New problem, no additional W/U planned (max 1)       3   New problem, additional W/U planned        4    Data Reviewed Points   Review/order clinical lab tests       1   Review/order x-rays       1   Review/order tests (Echo, EKG, PFTs, etc)       1   Discussion of test results w/ performing MD       1   Independent review of image, tracing or specimen       2   Decision to obtain old records       1   Review and summation of old records       2    Level of risk Presenting prob Diagnostics Management   Minimal 1 self limited/minor Labs CXR EKG/EEG U/A U/S Rest Gargles Bandages Dressings   Low 2 or more self limited/minor 1 stable chronic Acute uncomplicated illness Tests (PFTS) Non-CV imaging Arterial labs Biopsies of skin OTC drugs Minor surgery-no risk PT OT IVF without additives    Moderate 1 or more chronic illnesses w/ mild exac, progression or S/E from tx 2 or more stable chronic illnesses Undiagnosed new problem w/ uncertain prognosis Acute complicated injury  Stress tests Endoscopies with no risk factors Deep needle or incisional bx CV imaging without risk LP Thoracentesis Paracentesis Minor surgery w/ risks Elective major surgery w/ no risk (open, percutaneous or endoscopic) Prescription drugs Therapeutic nucl med IVF with additives Closed tx of fracture/dislocation    High Severe exac of  chronic illness Acute or chronic illness/injury may pose a threat to life or bodily function (ARF) Change in neuro status    CV imaging w/ contrast and risk Cardio electophysiologic tests Endoscopies w/ risk Discography Elective major surgery Emergency major surgery Parenteral controlled substances Drug therapy req monitoring for toxicity DNR/de-escalation of care    MDM Prob points Data points Risk   Straightforward    <1    <1    Min   Low complexity    2    2    Low   Moderate    3    3    Mod   High Complexity    4 or more    4 or more    High      LOS: 0 days   Marcea Rojek  [facility] Pager [phone]. If unable to reach me by pager, please call my cell phone at [phone].  *Please refer to [email or web address], password [email or web address] to get updated schedule on who will round on this patient, as hospitalists switch teams weekly. If 7PM-7AM, please contact night-coverage at [email or web address], password [email or web address] for any overnight needs.  07/02/2016, 9:21 AM

## 2016-07-02 NOTE — Progress Notes (Signed)
Physical Therapy Treatment Patient Details Name: Wayne [name] MRN: [id number] DOB: 11/18/1929 Today's Date: 07/02/2016    History of Present Illness 83 y.o. male admitted with influenza A. PMH consists of CAD, hypertension, prior MI, atrial fibrillation on chronic Coumadin, and stroke.     PT Comments    Pt states he feels more weak today but agreeable to participate in PT session.  Only tolerated gt x 50' with RW but was able to complete stair training.  Completed sit/stand transfers with min guard assist but slightly effortful.   Cont with current POC to maximize functional mobility prior to d/c home with wife.      Follow Up Recommendations  Home health PT;Supervision for mobility/OOB     Equipment Recommendations  None recommended by PT    Recommendations for Other Services       Precautions / Restrictions Precautions Precautions: Fall Restrictions Weight Bearing Restrictions: No    Mobility  Bed Mobility Overal bed mobility: Needs Assistance Bed Mobility: Supine to Sit     Supine to sit: Supervision;HOB elevated     General bed mobility comments: slightly effortful and increased time but no physical assist needed.  HOB elevated and use of rail to sit upright.   Transfers Overall transfer level: Needs assistance Equipment used: Rolling walker (2 wheeled) Transfers: Sit to/from Stand Sit to Stand: Min guard         General transfer comment: cues for hand placement.    Ambulation/Gait Ambulation/Gait assistance: Min guard Ambulation Distance (Feet): 50 Feet Assistive device: Rolling walker (2 wheeled) Gait Pattern/deviations: Step-through pattern;Decreased stride length;Decreased step length - right;Decreased step length - left Gait velocity: decreased   General Gait Details: Pt only tolerated 50' of gt due to fatigue.  Verbal cues for proximity of body to RW and for proper breathing.     Stairs Stairs: Yes   Stair Management: Two  rails;Forwards;Step to pattern Number of Stairs: 6 General stair comments: guarding for safety. Pt fatigued after completing requiring seated rest break in chair.    Wheelchair Mobility    Modified Rankin (Stroke Patients Only)       Balance                                    Cognition Arousal/Alertness: Awake/alert Behavior During Therapy: WFL for tasks assessed/performed Overall Cognitive Status: Within Functional Limits for tasks assessed                      Exercises      General Comments        Pertinent Vitals/Pain Pain Assessment: No/denies pain    Home Living                      Prior Function            PT Goals (current goals can now be found in the care plan section) Acute Rehab PT Goals Patient Stated Goal: get stronger PT Goal Formulation: With patient/family Time For Goal Achievement: 07/08/16 Potential to Achieve Goals: Good Progress towards PT goals: Progressing toward goals    Frequency    Min 3X/week      PT Plan Current plan remains appropriate    Co-evaluation             End of Session Equipment Utilized During Treatment: Gait belt Activity Tolerance: Patient limited by fatigue Patient  left: in chair;with call bell/phone within reach     Time: 0945-1012 PT Time Calculation (min) (ACUTE ONLY): 27 min  Charges:  $Gait Training: 8-22 mins $Therapeutic Activity: 8-22 mins                    G Codes:      Sena Hitch 07/02/2016, 10:25 AM   Sarajane Marek, PTA [phone] 07/02/2016

## 2016-07-02 NOTE — Care Management Obs Status (Signed)
[place] NOTIFICATION   Patient Details  Name: Wayne [name] MRN: [id number] Date of Birth: 02-Nov-1929   Medicare Observation Status Notification Given:  Yes    Dawayne Patricia, RN 07/02/2016, 1:27 PM

## 2016-07-02 NOTE — Progress Notes (Signed)
Pharmacy Antibiotic Note  Tadeh Facemire is a 83 y.o. male to begin levaquin for CAP. Renal function stable.  Plan: 1) Levaquin 500mg  IV q24 2) Follow renal function, cultures, LOT  Height: 5\' 11"  (180.3 cm) Weight: 193 lb 11.2 oz (87.9 kg) IBW/kg (Calculated) : 75.3  Temp (24hrs), Avg:98.9 F (37.2 C), Min:97.8 F (36.6 C), Max:100.3 F (37.9 C)   Recent Labs Lab 06/30/16 1207 06/30/16 1233 06/30/16 1809 07/01/16 0600 07/02/16 0638  WBC 6.0  --   --  5.5 7.6  CREATININE 1.16  --   --   --   --   LATICACIDVEN  --  1.38 1.28  --   --     Estimated Creatinine Clearance: 50.5 mL/min (by C-G formula based on SCr of 1.16 mg/dL).    Allergies  Allergen Reactions  . Procaine Hcl Other (See Comments)    Passed out after being given this at dental appointment  . Tylenol [Acetaminophen] Other (See Comments)    Makes him very sleepy if he takes more than one tablet  . Penicillins Rash    Has patient had a PCN reaction causing immediate rash, facial/tongue/throat swelling, SOB or lightheadedness with hypotension: Yes Has patient had a PCN reaction causing severe rash involving mucus membranes or skin necrosis: No Has patient had a PCN reaction that required hospitalization No Has patient had a PCN reaction occurring within the last 10 years: No If all of the above answers are "NO", then may proceed with Cephalosporin use.    Antimicrobials this admission: 1/14 Levaquin >>  Dose adjustments this admission: n/a  Microbiology results: 1/12 blood x2 >> 1/12 urine >> suggest recollection  Thank you for allowing pharmacy to be a part of this patient's care.  Deboraha Sprang 07/02/2016 4:03 PM

## 2016-07-02 NOTE — Progress Notes (Signed)
[place] for Coumadin Indication: atrial fibrillation  Allergies  Allergen Reactions  . Procaine Hcl Other (See Comments)    Passed out after being given this at dental appointment  . Tylenol [Acetaminophen] Other (See Comments)    Makes him very sleepy if he takes more than one tablet  . Penicillins Rash    Has patient had a PCN reaction causing immediate rash, facial/tongue/throat swelling, SOB or lightheadedness with hypotension: Yes Has patient had a PCN reaction causing severe rash involving mucus membranes or skin necrosis: No Has patient had a PCN reaction that required hospitalization No Has patient had a PCN reaction occurring within the last 10 years: No If all of the above answers are "NO", then may proceed with Cephalosporin use.    Patient Measurements: Height: 5\' 11"  (180.3 cm) Weight: 193 lb 11.2 oz (87.9 kg) IBW/kg (Calculated) : 75.3  Vital Signs: Temp: 98.1 F (36.7 C) (01/14 0626) Temp Source: Oral (01/14 0626) BP: 136/78 (01/14 0626) Pulse Rate: 100 (01/14 1021)  Labs:  Recent Labs  06/30/16 1207 06/30/16 2133 07/01/16 0600 07/02/16 0638  HGB 13.5  --  12.9* 13.8  HCT 40.3  --  38.5* 40.8  PLT 126*  --  119* 119*  LABPROT  --  19.4* 19.0* 19.2*  INR  --  1.61 1.58 1.59  CREATININE 1.16  --   --   --     Estimated Creatinine Clearance: 50.5 mL/min (by C-G formula based on SCr of 1.16 mg/dL).   Medical History: Past Medical History:  Diagnosis Date  . Arthritis   . Atrial fibrillation (HCC)    Chronic  . BPH (benign prostatic hyperplasia)   . CAD (coronary artery disease)   . Chronic anticoagulation   . History of chicken pox   . HTN (hypertension)   . Old MI (myocardial infarction) 2007   s/p PCI to distal OM (no stent)  . Stroke, embolic [facility])     Assessment: 83 yo M presents on 1/12 with weakness. Found to have the flu. Pharmacy consulted to continue Coumadin.   On Coumadin 5mg  daily exc  2.5mg  on Tues/Sat PTA for Afib. Last INR check was on 1/5 and slightly low.   INR continues to be low at 1.59 despite 7.5 mg po x 2 doses  Goal of Therapy:  INR 2-3 Monitor platelets by anticoagulation protocol: Yes   Plan:  Coumadin 10 mg po x 1 dose tonight Daily INR   Thank you Anette Guarneri, PharmD [phone] 07/02/2016 1:00 PM

## 2016-07-02 NOTE — Care Management Note (Signed)
Case Management Note [name] RN, BSN Unit 2W-Case Manager [phone]  Patient Details  Name: Agam Eveland MRN: [id number] Date of Birth: 02-09-1930  Subjective/Objective:   Pt in OBS for the flu                 Action/Plan: PTA pt lived at home with wife- PT eval recommendation for HHPT- order placed- spke with pt and wife at bedside= choice offered for [facility] services per choice they would like to use [facility] for services- referral called to [facility] for HHPT- possible d/c later today pending cxr  Expected Discharge Date:     07/02/16             Expected Discharge Plan:  [place]  In-House Referral:     Discharge planning Services  CM Consult  Post Acute Care Choice:  Home Health Choice offered to:  Patient, Spouse  DME Arranged:    DME Agency:     HH Arranged:  PT [place]:  [place]  Status of Service:  Completed, signed off  If discussed at [place] of Stay Meetings, dates discussed:    Discharge Disposition: home/home health   Additional Comments:  Dawayne Patricia, RN 07/02/2016, 1:28 PM

## 2016-07-03 LAB — CBC
HCT: 41.5 % (ref 39.0–52.0)
HEMOGLOBIN: 14.4 g/dL (ref 13.0–17.0)
MCH: 30.1 pg (ref 26.0–34.0)
MCHC: 34.7 g/dL (ref 30.0–36.0)
MCV: 86.8 fL (ref 78.0–100.0)
Platelets: 179 10*3/uL (ref 150–400)
RBC: 4.78 MIL/uL (ref 4.22–5.81)
RDW: 15.2 % (ref 11.5–15.5)
WBC: 9.1 10*3/uL (ref 4.0–10.5)

## 2016-07-03 LAB — PROTIME-INR
INR: 1.93
PROTHROMBIN TIME: 22.4 s — AB (ref 11.4–15.2)

## 2016-07-03 MED ORDER — TAMSULOSIN HCL 0.4 MG PO CAPS
0.4000 mg | ORAL_CAPSULE | Freq: Every day | ORAL | Status: DC
  Administered 2016-07-03 – 2016-07-04 (×2): 0.4 mg via ORAL
  Filled 2016-07-03 (×2): qty 1

## 2016-07-03 MED ORDER — WARFARIN SODIUM 5 MG PO TABS
5.0000 mg | ORAL_TABLET | Freq: Once | ORAL | Status: AC
  Administered 2016-07-03: 5 mg via ORAL
  Filled 2016-07-03: qty 1

## 2016-07-03 NOTE — Care Management Note (Signed)
Case Management Note  Patient Details  Name: Wayne [name] MRN: [id number] Date of Birth: 1929-08-23  Subjective/Objective:            Admitted with the flu.       PCP:  August Saucer  Action/Plan: Plan to d/c to home today with home health services(PT).  Expected Discharge Date:    07/03/2016             Expected Discharge Plan:  [place]  In-House Referral:     Discharge planning Services  CM Consult  Post Acute Care Choice:  Home Health Choice offered to:  Patient, Spouse  DME Arranged:    DME Agency:     HH Arranged:  PT [place]:  [place]  Status of Service:  Completed, signed off  If discussed at [place] of Stay Meetings, dates discussed:    Additional Comments:  Sharin Mons, RN 07/03/2016, 10:55 AM

## 2016-07-03 NOTE — Progress Notes (Signed)
Progress Note    Wayne [name]  [id number] DOB: 1930-01-27  DOA: 06/30/2016 PCP: Wayne Po, FNP    Brief Narrative:   Chief complaint: Follow-up influenza  Wayne [name] is an 83 y.o. male with a PMH of prior stroke, chronic atrial fibrillation and hypertension who was admitted 06/30/16 with chief complaint of significant weakness.  Assessment/Plan:   Principal Problem:   Influenza A with weakness/cough and delirium No improvement despite Tamiflu. Remains a bit confused. Biggest symptom is generalized weakness, DOE. PT recommended home therapy. He lives with his elderly, frail wife.Serial x-rays as pictured below. Initially clear, but new left upper lobe pneumonia appreciated on follow-up. Bilateral airspace disease also apparent. Continue Levaquin per pharmacy.  Will need to moitor INR closely while on Levaquin.      Active Problems:   Chronic atrial fibrillation (HCC)/chronic anticoagulation The patient is currently rate controlled and on chronic blood thinners. Continue metoprolol and digoxin. Continue Coumadin.  Monitor INR closely.      HTN (hypertension) Blood pressure controlled on metoprolol.    Left bundle branch block (LBBB) on electrocardiogram  This appears to be new. Outpatient follow-up.    History of stroke Continue risk factor modification.  Family Communication/Anticipated D/C date and plan/Code Status   DVT prophylaxis: Coumadin ordered. Code Status: Full Code.  Family Communication: Daughter updated the bedside. Disposition Plan: Stable for discharge yet, possibly another 24-48 hours.   Medical Consultants:    None.   Procedures:    None  Anti-Infectives:    Tamiflu 06/30/16--->  Levaquin 07/02/16--->  Subjective:   Patient tells me he does not feel any better yet, very weak.    Objective:    Vitals:   07/02/16 1021 07/02/16 1439 07/02/16 2109 07/03/16 0516  BP:  112/66 119/72 111/62  Pulse: 100  84 93 87  Resp:  16 18 18   Temp:  97.8 F (36.6 C) 98.1 F (36.7 C) 98.7 F (37.1 C)  TempSrc:  Oral Oral Oral  SpO2:  97% 95% 91%  Weight:      Height:        Intake/Output Summary (Last 24 hours) at 07/03/16 0830 Last data filed at 07/03/16 0322  Gross per 24 hour  Intake              100 ml  Output              600 ml  Net             -500 ml   Filed Weights   06/30/16 1200 06/30/16 2121  Weight: 86.2 kg (190 lb) 87.9 kg (193 lb 11.2 oz)    Exam: General exam: Appears very weak. Respiratory system: A few bibasilar crackles. Respiratory effort normal at rest. Cardiovascular system: HSIR. No JVD,  rubs, gallops or clicks. II/VI SEM. Gastrointestinal system: Abdomen is nondistended, soft and nontender. No organomegaly or masses felt. Normal bowel sounds heard. Central nervous system: Alert and oriented x 2, but mildly confused. No focal neurological deficits. Extremities: No clubbing,  or cyanosis. No edema. Skin: No rashes, lesions or ulcers. Psychiatry: Judgement and insight appear mildly impaired. Mood & affect appropriate.   Data Reviewed:   I have personally reviewed following labs and imaging studies:  Labs: Basic Metabolic Panel:  Recent Labs Lab 06/30/16 1207  NA 135  K 3.9  CL 104  CO2 23  GLUCOSE 95  BUN 12  CREATININE 1.16  CALCIUM 8.1*   GFR Estimated  Creatinine Clearance: 50.5 mL/min (by C-G formula based on SCr of 1.16 mg/dL). Liver Function Tests:  Recent Labs Lab 06/30/16 1207  AST 28  ALT 16*  ALKPHOS 31*  BILITOT 1.1  PROT 6.3*  ALBUMIN 3.3*   No results for input(s): LIPASE, AMYLASE in the last 168 hours. No results for input(s): AMMONIA in the last 168 hours. Coagulation profile  Recent Labs Lab 06/30/16 2133 07/01/16 0600 07/02/16 0638 07/03/16 0348  INR 1.61 1.58 1.59 1.93    CBC:  Recent Labs Lab 06/30/16 1207 07/01/16 0600 07/02/16 0638 07/03/16 0348  WBC 6.0 5.5 7.6 9.1  NEUTROABS 4.2  --   --   --     HGB 13.5 12.9* 13.8 14.4  HCT 40.3 38.5* 40.8 41.5  MCV 87.8 88.5 87.6 86.8  PLT 126* 119* 119* 179   Sepsis Labs:  Recent Labs Lab 06/30/16 1207 06/30/16 1233 06/30/16 1809 07/01/16 0600 07/02/16 0638 07/03/16 0348  WBC 6.0  --   --  5.5 7.6 9.1  LATICACIDVEN  --  1.38 1.28  --   --   --     Microbiology Recent Results (from the past 240 hour(s))  Culture, blood (Routine x 2)     Status: None (Preliminary result)   Collection Time: 06/30/16 12:00 PM  Result Value Ref Range Status   Specimen Description BLOOD LEFT FOREARM  Final   Special Requests BOTTLES DRAWN AEROBIC AND ANAEROBIC  10CC  Final   Culture NO GROWTH 2 DAYS  Final   Report Status PENDING  Incomplete  Urine culture     Status: Abnormal   Collection Time: 06/30/16  1:37 PM  Result Value Ref Range Status   Specimen Description URINE, CLEAN CATCH  Final   Special Requests NONE  Final   Culture MULTIPLE SPECIES PRESENT, SUGGEST RECOLLECTION (A)  Final   Report Status 07/01/2016 FINAL  Final  Culture, blood (Routine x 2)     Status: None (Preliminary result)   Collection Time: 06/30/16  3:21 PM  Result Value Ref Range Status   Specimen Description BLOOD RIGHT ANTECUBITAL  Final   Special Requests BOTTLES DRAWN AEROBIC AND ANAEROBIC 5CC  Final   Culture NO GROWTH 2 DAYS  Final   Report Status PENDING  Incomplete    Radiology: Dg Chest Port 1 View  Result Date: 07/02/2016 CLINICAL DATA:  Influenza. EXAM: PORTABLE CHEST 1 VIEW COMPARISON:  Two-view chest x-ray 06/30/2016 FINDINGS: The heart size is normal. Atherosclerotic calcifications are present at the arch. New left upper lobe pneumonia is present. Bilateral lower lobe airspace disease has increased. Chronic interstitial coarsening is again seen. IMPRESSION: 1. New left upper lobe pneumonia. 2. Name bilateral lower lobe airspace disease may reflect infection as well. 3. Aortic atherosclerosis. 4. Emphysema. Electronically Signed   By: San Morelle  M.D.   On: 07/02/2016 14:00    Medications:   . digoxin  125 mcg Oral Daily  . levofloxacin (LEVAQUIN) IV  500 mg Intravenous Q24H  . metoprolol tartrate  12.5 mg Oral BID  . oseltamivir  30 mg Oral BID  . sodium chloride flush  3 mL Intravenous Q12H  . Warfarin - Pharmacist Dosing Inpatient   Does not apply q1800   Continuous Infusions:  Medical decision making is of Moderate complexity and this patient is at moderate risk of deterioration, therefore this is a level 2 visit. (> 4 problem points, 2 data points, Moderate risk)   Problems/DDx Points   Self limited or minor (  max 2)       1   Established problem, stable       1   Established problem, worsening       2   New problem, no additional W/U planned (max 1)       3   New problem, additional W/U planned        4    Data Reviewed Points   Review/order clinical lab tests       1   Review/order x-rays       1   Review/order tests (Echo, EKG, PFTs, etc)       1   Discussion of test results w/ performing MD       1   Independent review of image, tracing or specimen       2   Decision to obtain old records       1   Review and summation of old records       2    Level of risk Presenting prob Diagnostics Management   Minimal 1 self limited/minor Labs CXR EKG/EEG U/A U/S Rest Gargles Bandages Dressings   Low 2 or more self limited/minor 1 stable chronic Acute uncomplicated illness Tests (PFTS) Non-CV imaging Arterial labs Biopsies of skin OTC drugs Minor surgery-no risk PT OT IVF without additives    Moderate 1 or more chronic illnesses w/ mild exac, progression or S/E from tx 2 or more stable chronic illnesses Undiagnosed new problem w/ uncertain prognosis Acute complicated injury  Stress tests Endoscopies with no risk factors Deep needle or incisional bx CV imaging without risk LP Thoracentesis Paracentesis Minor surgery w/ risks Elective major surgery w/ no risk (open, percutaneous or  endoscopic) Prescription drugs Therapeutic nucl med IVF with additives Closed tx of fracture/dislocation    High Severe exac of chronic illness Acute or chronic illness/injury may pose a threat to life or bodily function (ARF) Change in neuro status    CV imaging w/ contrast and risk Cardio electophysiologic tests Endoscopies w/ risk Discography Elective major surgery Emergency major surgery Parenteral controlled substances Drug therapy req monitoring for toxicity DNR/de-escalation of care    MDM Prob points Data points Risk   Straightforward    <1    <1    Min   Low complexity    2    2    Low   Moderate    3    3    Mod   High Complexity    4 or more    4 or more    High      LOS: 1 day   RAMA,CHRISTINA  [facility] Pager [phone]. If unable to reach me by pager, please call my cell phone at [phone].  *Please refer to [email or web address], password [email or web address] to get updated schedule on who will round on this patient, as hospitalists switch teams weekly. If 7PM-7AM, please contact night-coverage at [email or web address], password [email or web address] for any overnight needs.  07/03/2016, 8:30 AM

## 2016-07-03 NOTE — Progress Notes (Signed)
ANTICOAGULATION CONSULT NOTE - Follow Up Consult  Pharmacy Consult for Coumadin Indication: atrial fibrillation  Patient Measurements: Height: 5\' 11"  (180.3 cm) Weight: 193 lb 11.2 oz (87.9 kg) IBW/kg (Calculated) : 75.3  Vital Signs: Temp: 98.7 F (37.1 C) (01/15 0516) Temp Source: Oral (01/15 0516) BP: 111/62 (01/15 0516) Pulse Rate: 86 (01/15 0858)  Labs:  Recent Labs  07/01/16 0600 07/02/16 0638 07/03/16 0348  HGB 12.9* 13.8 14.4  HCT 38.5* 40.8 41.5  PLT 119* 119* 179  LABPROT 19.0* 19.2* 22.4*  INR 1.58 1.59 1.93    Estimated Creatinine Clearance: 50.5 mL/min (by C-G formula based on SCr of 1.16 mg/dL).  Assessment:   83 yr old male admitted 06/30/16 with weakness. Found to have the flu. On day # 4 of 5 [place].  Levaquin added 1/14 pm for CAP coverage. Sometimes effects INR.   Continues on Coumadin for afib as prior to admission.  INR only 1.6 on admit 1/12, but up to 1.93 today after increased doses x 3 days.   Last outpt INR also 1.6 on 06/23/16.    Home Coumadin regimen:  5 mg daily except 2.5 mg on Tuesdays and Saturdays.  Goal of Therapy:  INR 2-3 Monitor platelets by anticoagulation protocol: Yes   Plan:   Coumadin 5 mg today. Usual Monday dose.  Daily PT/INR.  Will watch for Levaquin effect on INR.  Arty Baumgartner, [place] Pager: [phone] 07/03/2016,12:55 PM

## 2016-07-03 NOTE — Progress Notes (Signed)
Physical Therapy Treatment Patient Details Name: Wayne [name] MRN: [id number] DOB: 23-Jan-1930 Today's Date: 07/03/2016    History of Present Illness 83 y.o. male admitted with influenza A. PMH consists of CAD, hypertension, prior MI, atrial fibrillation on chronic Coumadin, and stroke.     PT Comments    Pt able to increase gait tolerance compared to last visit but deferred doing stairs due to fatigue. Plan to incorporate any other activities to continue challenging pt's balance and endurance.   Follow Up Recommendations  Home health PT;Supervision for mobility/OOB     Equipment Recommendations  Rolling walker with 5" wheels (upon further interview, pt reports he shares RW with wife. Would benefit with his own RW issued to him. )    Recommendations for Other Services       Precautions / Restrictions Precautions Precautions: Fall Restrictions Weight Bearing Restrictions: No    Mobility  Bed Mobility Overal bed mobility: Needs Assistance Bed Mobility: Sit to Supine     Supine to sit: Supervision;HOB elevated     General bed mobility comments: Pt able to lower trunk and position legs in bed but required v/c for positioning higher in bed.  Transfers Overall transfer level: Needs assistance Equipment used: Rolling walker (2 wheeled) Transfers: Sit to/from Stand           General transfer comment: pt did not perform sit to stand but did perform stand to sit  Ambulation/Gait Ambulation/Gait assistance: Min guard Ambulation Distance (Feet): 200 Feet Assistive device: Rolling walker (2 wheeled) Gait Pattern/deviations: Step-through pattern;Decreased step length - right;Trunk flexed   Gait velocity interpretation: at or above normal speed for age/gender General Gait Details: pt required v/c to stay within boundaries of walker and to correct upright posture.    Stairs            Wheelchair Mobility    Modified Rankin (Stroke Patients Only)        Balance Overall balance assessment: Needs assistance Sitting-balance support: No upper extremity supported;Feet supported Sitting balance-Leahy Scale: Good     Standing balance support: Bilateral upper extremity supported;During functional activity Standing balance-Leahy Scale: Fair Standing balance comment: pt standing in room upon arrival but required RW during functional activities.                     Cognition Arousal/Alertness: Awake/alert Behavior During Therapy: WFL for tasks assessed/performed Overall Cognitive Status: Within Functional Limits for tasks assessed                      Exercises      General Comments        Pertinent Vitals/Pain Pain Assessment: No/denies pain    Home Living                      Prior Function            PT Goals (current goals can now be found in the care plan section) Acute Rehab PT Goals Patient Stated Goal: ready to go home with wife.  PT Goal Formulation: With patient Potential to Achieve Goals: Good Progress towards PT goals: Progressing toward goals    Frequency    Min 3X/week      PT Plan Current plan remains appropriate    Co-evaluation             End of Session Equipment Utilized During Treatment: Gait belt (applied in standing due to pt standing upon PTA arrival. )  Activity Tolerance: Patient limited by fatigue;Patient tolerated treatment well Patient left: in bed;with bed alarm set;with call bell/phone within reach;with family/visitor present (nurse tech reports she will set bed alarm. )     Time: [id number] PT Time Calculation (min) (ACUTE ONLY): 17 min  Charges:  $Gait Training: 8-22 mins                    G Codes:      [facility] 07/07/2016, 4:55 PM  Olena [place], [place] Pager [phone]

## 2016-07-03 NOTE — Discharge Instructions (Addendum)
Influenza, Adult Influenza, more commonly known as the flu, is a viral infection that primarily affects the respiratory tract. The respiratory tract includes organs that help you breathe, such as the lungs, nose, and throat. The flu causes many common cold symptoms, as well as a high fever and body aches. The flu spreads easily from person to person (is contagious). Getting a flu shot (influenza vaccination) every year is the best way to prevent influenza. What are the causes? Influenza is caused by a virus. You can catch the virus by:  Breathing in droplets from an infected person's cough or sneeze.  Touching something that was recently contaminated with the virus and then touching your mouth, nose, or eyes. What increases the risk? The following factors may make you more likely to get the flu:  Not cleaning your hands frequently with soap and water or alcohol-based hand sanitizer.  Having close contact with many people during cold and flu season.  Touching your mouth, eyes, or nose without washing or sanitizing your hands first.  Not drinking enough fluids or not eating a healthy diet.  Not getting enough sleep or exercise.  Being under a high amount of stress.  Not getting a yearly (annual) flu shot. You may be at a higher risk of complications from the flu, such as a severe lung infection (pneumonia), if you:  Are over the age of 32.  Are pregnant.  Have a weakened disease-fighting system (immune system). You may have a weakened immune system if you:  Have HIV or AIDS.  Are undergoing chemotherapy.  Aretaking medicines that reduce the activity of (suppress) the immune system.  Have a long-term (chronic) illness, such as heart disease, kidney disease, diabetes, or lung disease.  Have a liver disorder.  Are obese.  Have anemia. What are the signs or symptoms? Symptoms of this condition typically last 4-10 days and may include:  Fever.  Chills.  Headache,  body aches, or muscle aches.  Sore throat.  Cough.  Runny or congested nose.  Chest discomfort and cough.  Poor appetite.  Weakness or tiredness (fatigue).  Dizziness.  Nausea or vomiting. How is this diagnosed? This condition may be diagnosed based on your medical history and a physical exam. Your health care provider may do a nose or throat swab test to confirm the diagnosis. How is this treated? If influenza is detected early, you can be treated with antiviral medicine that can reduce the length of your illness and the severity of your symptoms. This medicine may be given by mouth (orally) or through an IV tube that is inserted in one of your veins. The goal of treatment is to relieve symptoms by taking care of yourself at home. This may include taking over-the-counter medicines, drinking plenty of fluids, and adding humidity to the air in your home. In some cases, influenza goes away on its own. Severe influenza or complications from influenza may be treated in a hospital. Follow these instructions at home:  Take over-the-counter and prescription medicines only as told by your health care provider.  Use a cool mist humidifier to add humidity to the air in your home. This can make breathing easier.  Rest as needed.  Drink enough fluid to keep your urine clear or pale yellow.  Cover your mouth and nose when you cough or sneeze.  Wash your hands with soap and water often, especially after you cough or sneeze. If soap and water are not available, use hand sanitizer.  Stay home  from work or school as told by your health care provider. Unless you are visiting your health care provider, try to avoid leaving home until your fever has been gone for 24 hours without the use of medicine.  Keep all follow-up visits as told by your health care provider. This is important. How is this prevented?  Getting an annual flu shot is the best way to avoid getting the flu. You may get the flu  shot in late summer, fall, or winter. Ask your health care provider when you should get your flu shot.  Wash your hands often or use hand sanitizer often.  Avoid contact with people who are sick during cold and flu season.  Eat a healthy diet, drink plenty of fluids, get enough sleep, and exercise regularly. Contact a health care provider if:  You develop new symptoms.  You have:  Chest pain.  Diarrhea.  A fever.  Your cough gets worse.  You produce more mucus.  You feel nauseous or you vomit. Get help right away if:  You develop shortness of breath or difficulty breathing.  Your skin or nails turn a bluish color.  You have severe pain or stiffness in your neck.  You develop a sudden headache or sudden pain in your face or ear.  You cannot stop vomiting. This information is not intended to replace advice given to you by your health care provider. Make sure you discuss any questions you have with your health care provider. Document Released: 06/02/2000 Document Revised: 11/11/2015 Document Reviewed: 03/30/2015 Elsevier Interactive Patient Education  2017 [place] on my medicine - Coumadin   (Warfarin)  This medication education was reviewed with me or my healthcare representative as part of my discharge preparation.  The pharmacist that spoke with me during my hospital stay was:  Arty Baumgartner, [facility]  Why was Coumadin prescribed for you? Coumadin was prescribed for you because you have a blood clot or a medical condition that can cause an increased risk of forming blood clots. Blood clots can cause serious health problems by blocking the flow of blood to the heart, lung, or brain. Coumadin can prevent harmful blood clots from forming. As a reminder your indication for Coumadin is:   Stroke Prevention Because Of Atrial Fibrillation  What test will check on my response to Coumadin? While on Coumadin (warfarin) you will need to have an INR test  regularly to ensure that your dose is keeping you in the desired range. The INR (international normalized ratio) number is calculated from the result of the laboratory test called prothrombin time (PT).  If an INR APPOINTMENT HAS NOT ALREADY BEEN MADE FOR YOU please schedule an appointment to have this lab work done by your health care provider within 7 days. Your INR goal is usually a number between:  2 to 3 or your provider may give you a more narrow range like 2-2.5.  Ask your health care provider during an office visit what your goal INR is.  What  do you need to  know  About  COUMADIN? Take Coumadin (warfarin) exactly as prescribed by your healthcare provider about the same time each day.  DO NOT stop taking without talking to the doctor who prescribed the medication.  Stopping without other blood clot prevention medication to take the place of Coumadin may increase your risk of developing a new clot or stroke.  Get refills before you run out.  What do you do if you miss a dose?  If you miss a dose, take it as soon as you remember on the same day then continue your regularly scheduled regimen the next day.  Do not take two doses of Coumadin at the same time.  Important Safety Information A possible side effect of Coumadin (Warfarin) is an increased risk of bleeding. You should call your healthcare provider right away if you experience any of the following: ? Bleeding from an injury or your nose that does not stop. ? Unusual colored urine (red or dark brown) or unusual colored stools (red or black). ? Unusual bruising for unknown reasons. ? A serious fall or if you hit your head (even if there is no bleeding).  Some foods or medicines interact with Coumadin (warfarin) and might alter your response to warfarin. To help avoid this: ? Eat a balanced diet, maintaining a consistent amount of Vitamin K. ? Notify your provider about major diet changes you plan to make. ? Avoid alcohol or limit your  intake to 1 drink for women and 2 drinks for men per day. (1 drink is 5 oz. wine, 12 oz. beer, or 1.5 oz. liquor.)  Make sure that ANY health care provider who prescribes medication for you knows that you are taking Coumadin (warfarin).  Also make sure the healthcare provider who is monitoring your Coumadin knows when you have started a new medication including herbals and non-prescription products.  Coumadin (Warfarin)  Major Drug Interactions  Increased Warfarin Effect Decreased Warfarin Effect  Alcohol (large quantities) Antibiotics (esp. Septra/Bactrim, Flagyl, Cipro) Amiodarone (Cordarone) Aspirin (ASA) Cimetidine (Tagamet) Megestrol (Megace) NSAIDs (ibuprofen, naproxen, etc.) Piroxicam (Feldene) Propafenone (Rythmol SR) Propranolol (Inderal) Isoniazid (INH) Posaconazole (Noxafil) Barbiturates (Phenobarbital) Carbamazepine (Tegretol) Chlordiazepoxide (Librium) Cholestyramine (Questran) Griseofulvin Oral Contraceptives Rifampin Sucralfate (Carafate) Vitamin K   Coumadin (Warfarin) Major Herbal Interactions  Increased Warfarin Effect Decreased Warfarin Effect  Garlic Ginseng Ginkgo biloba Coenzyme Q10 Green tea St. Johns wort    Coumadin (Warfarin) FOOD Interactions  Eat a consistent number of servings per week of foods HIGH in Vitamin K (1 serving =  cup)  Collards (cooked, or boiled & drained) Kale (cooked, or boiled & drained) Mustard greens (cooked, or boiled & drained) Parsley *serving size only =  cup Spinach (cooked, or boiled & drained) Swiss chard (cooked, or boiled & drained) Turnip greens (cooked, or boiled & drained)  Eat a consistent number of servings per week of foods MEDIUM-HIGH in Vitamin K (1 serving = 1 cup)  Asparagus (cooked, or boiled & drained) Broccoli (cooked, boiled & drained, or raw & chopped) Brussel sprouts (cooked, or boiled & drained) *serving size only =  cup Lettuce, raw (green leaf, endive, romaine) Spinach, raw Turnip  greens, raw & chopped   These websites have more information on Coumadin (warfarin):  FailFactory.se; VeganReport.com.au;

## 2016-07-04 LAB — CBC
HCT: 38.6 % — ABNORMAL LOW (ref 39.0–52.0)
Hemoglobin: 13 g/dL (ref 13.0–17.0)
MCH: 29.1 pg (ref 26.0–34.0)
MCHC: 33.7 g/dL (ref 30.0–36.0)
MCV: 86.4 fL (ref 78.0–100.0)
PLATELETS: 122 10*3/uL — AB (ref 150–400)
RBC: 4.47 MIL/uL (ref 4.22–5.81)
RDW: 15 % (ref 11.5–15.5)
WBC: 5.3 10*3/uL (ref 4.0–10.5)

## 2016-07-04 LAB — BASIC METABOLIC PANEL
Anion gap: 9 (ref 5–15)
BUN: 8 mg/dL (ref 6–20)
CHLORIDE: 97 mmol/L — AB (ref 101–111)
CO2: 25 mmol/L (ref 22–32)
CREATININE: 0.8 mg/dL (ref 0.61–1.24)
Calcium: 8.1 mg/dL — ABNORMAL LOW (ref 8.9–10.3)
GFR calc non Af Amer: >60 mL/min (ref >60)
Glucose, Bld: 102 mg/dL — ABNORMAL HIGH (ref 65–99)
Potassium: 3.3 mmol/L — ABNORMAL LOW (ref 3.5–5.1)
Sodium: 131 mmol/L — ABNORMAL LOW (ref 135–145)

## 2016-07-04 LAB — PROTIME-INR
INR: 3.73
Prothrombin Time: 37.9 seconds — ABNORMAL HIGH (ref 11.4–15.2)

## 2016-07-04 MED ORDER — LEVOFLOXACIN 500 MG PO TABS: 500.0000 mg | ORAL_TABLET | Freq: Every day | ORAL | 0 refills | Status: DC

## 2016-07-04 MED ORDER — WARFARIN SODIUM 2.5 MG PO TABS: 2.5000 mg | ORAL_TABLET | ORAL | Status: DC

## 2016-07-04 MED ORDER — METOPROLOL TARTRATE 25 MG PO TABS: 12.5000 mg | ORAL_TABLET | ORAL | Status: DC

## 2016-07-04 MED ORDER — POLYETHYLENE GLYCOL 3350 17 G PO PACK
17.0000 g | PACK | Freq: Every day | ORAL | Status: DC | PRN
  Administered 2016-07-04: 17 g via ORAL
  Filled 2016-07-04: qty 1

## 2016-07-04 MED ORDER — TAMSULOSIN HCL 0.4 MG PO CAPS: 0.4000 mg | ORAL_CAPSULE | Freq: Every day | ORAL | 0 refills | Status: DC

## 2016-07-04 MED ORDER — OSELTAMIVIR PHOSPHATE 30 MG PO CAPS: 30.0000 mg | ORAL_CAPSULE | Freq: Two times a day (BID) | ORAL | 0 refills | Status: DC

## 2016-07-04 MED ORDER — POTASSIUM CHLORIDE CRYS ER 20 MEQ PO TBCR
20.0000 meq | EXTENDED_RELEASE_TABLET | ORAL | Status: DC
  Administered 2016-07-04: 20 meq via ORAL
  Filled 2016-07-04: qty 1

## 2016-07-04 NOTE — Discharge Summary (Signed)
Physician Discharge Summary  Wayne [name] [id number] DOB: 12/12/1929 DOA: 06/30/2016  PCP: Mauricio Po, FNP  Admit date: 06/30/2016 Discharge date: 07/04/2016  Admitted From: Home Discharge disposition: Home   Recommendations for Outpatient Follow-Up:   1. Recommend close F/U of PT/INR due to being discharged on Levaquin. 2. Home health physical therapy set up. 3. Flomax started for urinary incontinence associated with incomplete bladder emptying. Follow-up symptoms. 4. Note: New left bundle branch block noted on EKG. Consider outpatient cardiac evaluation if deemed appropriate.   Discharge Diagnosis:   Principal Problem:    Influenza A with pneumonia Active Problems:    Chronic atrial fibrillation (HCC)    Chronic anticoagulation    HTN (hypertension)    Cough    Left bundle branch block (LBBB) on electrocardiogram    History of stroke    Generalized weakness    CAP (community acquired pneumonia)    Urinary incontinence  Discharge Condition: Improved.  Diet recommendation: Low sodium, heart healthy.    History of Present Illness:   Wayne [name] is an 83 y.o. male with a PMH of prior stroke, chronic atrial fibrillation and hypertension who was admitted 06/30/16 with chief complaint of significant weakness.  Hospital Course by Problem:   Principal Problem: Influenza A with weakness/cough and delirium The patient did not improve despite Tamiflu, and remained a bit confused prompting [place] to repeat his chest x-ray which showed a left upper lobe pneumonia. Due to penicillin allergy, he was started on Levaquin. He will be discharged on an additional 5 days of Levaquin, but will need close follow-up of his INR.      Active Problems: Chronic atrial fibrillation (HCC)/chronic anticoagulation The patient is currently rate controlled and on chronic blood thinners. Continue metoprolol and digoxin. Continue Coumadin.  Monitor INR  closely.    HTN (hypertension) Blood pressure controlled on metoprolol.  Left bundle branch block (LBBB) on electrocardiogram  This appears to be new. Outpatient follow-up.  History of stroke Continue risk factor modification.    Hypokalemia Repleted potassium.    Urinary incontinence Associated with incomplete bladder emptying. Likely from enlarged prostate. Started on Flomax.  Medical Consultants:    None.   Discharge Exam:   Vitals:   07/04/16 0514 07/04/16 1009  BP: 113/70 123/70  Pulse: 87 82  Resp: 16   Temp: 98.8 F (37.1 C)    Vitals:   07/03/16 1432 07/03/16 2223 07/04/16 0514 07/04/16 1009  BP: 122/67 128/72 113/70 123/70  Pulse: 85 93 87 82  Resp: 18 20 16    Temp: 98.5 F (36.9 C) 98.6 F (37 C) 98.8 F (37.1 C)   TempSrc:      SpO2: 99% 97% 99%   Weight:      Height:        General exam: Appears weak. Respiratory system: A few bibasilar crackles. Respiratory effort normal at rest. Cardiovascular system: HSIR. No JVD,  rubs, gallops or clicks. II/VI SEM. Gastrointestinal system: Abdomen is nondistended, soft and nontender. No organomegaly or masses felt. Normal bowel sounds heard. Central nervous system: Alert and oriented x 2, but mildly confused. No focal neurological deficits. Extremities: No clubbing,  or cyanosis. No edema. Skin: No rashes, lesions or ulcers. Psychiatry: Judgement and insight appear mildly impaired. Mood & affect appropriate.    The results of significant diagnostics from this hospitalization (including imaging, microbiology, ancillary and laboratory) are listed below for reference.     Procedures and Diagnostic Studies:   Dg Chest 2  View  Result Date: 06/30/2016 CLINICAL DATA:  Cough, shortness of breath. EXAM: CHEST  2 VIEW COMPARISON:  Radiographs of August 09, 2011. FINDINGS: Stable cardiomediastinal silhouette. Atherosclerosis of thoracic aorta is noted. No pneumothorax or pleural effusion is noted. No  acute pulmonary disease is noted. Bony thorax is unremarkable. IMPRESSION: No active cardiopulmonary disease. Electronically Signed   By: Marijo Conception, M.D.   On: 06/30/2016 12:55     Labs:   Basic Metabolic Panel:  Recent Labs Lab 06/30/16 1207 07/04/16 0514  NA 135 131*  K 3.9 3.3*  CL 104 97*  CO2 23 25  GLUCOSE 95 102*  BUN 12 8  CREATININE 1.16 0.80  CALCIUM 8.1* 8.1*   GFR Estimated Creatinine Clearance: 73.2 mL/min (by C-G formula based on SCr of 0.8 mg/dL). Liver Function Tests:  Recent Labs Lab 06/30/16 1207  AST 28  ALT 16*  ALKPHOS 31*  BILITOT 1.1  PROT 6.3*  ALBUMIN 3.3*   No results for input(s): LIPASE, AMYLASE in the last 168 hours. No results for input(s): AMMONIA in the last 168 hours. Coagulation profile  Recent Labs Lab 06/30/16 2133 07/01/16 0600 07/02/16 0638 07/03/16 0348 07/04/16 0514  INR 1.61 1.58 1.59 1.93 3.73    CBC:  Recent Labs Lab 06/30/16 1207 07/01/16 0600 07/02/16 0638 07/03/16 0348 07/04/16 0514  WBC 6.0 5.5 7.6 9.1 5.3  NEUTROABS 4.2  --   --   --   --   HGB 13.5 12.9* 13.8 14.4 13.0  HCT 40.3 38.5* 40.8 41.5 38.6*  MCV 87.8 88.5 87.6 86.8 86.4  PLT 126* 119* 119* 179 122*   Cardiac Enzymes: No results for input(s): CKTOTAL, CKMB, CKMBINDEX, TROPONINI in the last 168 hours. BNP: Invalid input(s): POCBNP CBG: No results for input(s): GLUCAP in the last 168 hours. D-Dimer No results for input(s): DDIMER in the last 72 hours. Hgb A1c No results for input(s): HGBA1C in the last 72 hours. Lipid Profile No results for input(s): CHOL, HDL, LDLCALC, TRIG, CHOLHDL, LDLDIRECT in the last 72 hours. Thyroid function studies No results for input(s): TSH, T4TOTAL, T3FREE, THYROIDAB in the last 72 hours.  Invalid input(s): FREET3 Anemia work up No results for input(s): VITAMINB12, FOLATE, FERRITIN, TIBC, IRON, RETICCTPCT in the last 72 hours. Microbiology Recent Results (from the past 240 hour(s))  Culture,  blood (Routine x 2)     Status: None (Preliminary result)   Collection Time: 06/30/16 12:00 PM  Result Value Ref Range Status   Specimen Description BLOOD LEFT FOREARM  Final   Special Requests BOTTLES DRAWN AEROBIC AND ANAEROBIC  10CC  Final   Culture NO GROWTH 4 DAYS  Final   Report Status PENDING  Incomplete  Urine culture     Status: Abnormal   Collection Time: 06/30/16  1:37 PM  Result Value Ref Range Status   Specimen Description URINE, CLEAN CATCH  Final   Special Requests NONE  Final   Culture MULTIPLE SPECIES PRESENT, SUGGEST RECOLLECTION (A)  Final   Report Status 07/01/2016 FINAL  Final  Culture, blood (Routine x 2)     Status: None (Preliminary result)   Collection Time: 06/30/16  3:21 PM  Result Value Ref Range Status   Specimen Description BLOOD RIGHT ANTECUBITAL  Final   Special Requests BOTTLES DRAWN AEROBIC AND ANAEROBIC 5CC  Final   Culture NO GROWTH 4 DAYS  Final   Report Status PENDING  Incomplete     Discharge Instructions:   Discharge Instructions  Call MD for:  extreme fatigue    Complete by:  As directed    Call MD for:  persistant nausea and vomiting    Complete by:  As directed    Call MD for:  temperature >100.4    Complete by:  As directed    Diet - low sodium heart healthy    Complete by:  As directed    Discharge instructions    Complete by:  As directed    Take tamiflu twice a day for 3 doses to treat the flu.  Take Levaquin daily once a day to treat pneumonia.  Take Flomax daily to treat problems with urinary retention/incontinence.  Antibiotic medications can cause your coumadin levels to fluctuate.  It is very important that you have close follow up with your regular doctor to check your coumadin levels (INR) to ensure that the levels remain therapeutic and not too high.  These prescriptions have been called in to your pharmacy.   Increase activity slowly    Complete by:  As directed      Allergies as of 07/04/2016      Reactions     Procaine Hcl Other (See Comments)   Passed out after being given this at dental appointment   Tylenol [acetaminophen] Other (See Comments)   Makes him very sleepy if he takes more than one tablet   Penicillins Rash   Has patient had a PCN reaction causing immediate rash, facial/tongue/throat swelling, SOB or lightheadedness with hypotension: Yes Has patient had a PCN reaction causing severe rash involving mucus membranes or skin necrosis: No Has patient had a PCN reaction that required hospitalization No Has patient had a PCN reaction occurring within the last 10 years: No If all of the above answers are "NO", then may proceed with Cephalosporin use.      Medication List    TAKE these medications   acetaminophen 500 MG tablet Commonly known as:  TYLENOL Take 500 mg by mouth daily as needed (pain).   ARTIFICIAL TEARS 1.4 % ophthalmic solution Generic drug:  polyvinyl alcohol Place 1 drop into both eyes daily.   diclofenac sodium 1 % Gel Commonly known as:  VOLTAREN Apply 2 g topically 4 (four) times daily.   digoxin 0.125 MG tablet Commonly known as:  LANOXIN Take 1 tablet (125 mcg total) by mouth daily.   Glucosamine HCl 1500 MG Tabs Take 1,500 mg by mouth 2 (two) times daily.   guaiFENesin 600 MG 12 hr tablet Commonly known as:  MUCINEX Take 600 mg by mouth 2 (two) times daily as needed for cough or to loosen phlegm.   levofloxacin 500 MG tablet Commonly known as:  LEVAQUIN Take 1 tablet (500 mg total) by mouth daily.   metoprolol tartrate 25 MG tablet Commonly known as:  LOPRESSOR Take 0.5-1 tablets (12.5-25 mg total) by mouth See admin instructions. Take 1 tablet (25 mg) by mouth every morning and 1/2 tablet (12.5 mg) at night What changed:  how much to take  how to take this  when to take this  additional instructions   oseltamivir 30 MG capsule Commonly known as:  TAMIFLU Take 1 capsule (30 mg total) by mouth 2 (two) times daily.   SAW PALMETTO BERRY  PO Take 1 tablet by mouth 2 (two) times daily.   tamsulosin 0.4 MG Caps capsule Commonly known as:  FLOMAX Take 1 capsule (0.4 mg total) by mouth daily.   triamcinolone cream 0.1 % Commonly known as:  KENALOG  Apply 1 application topically 2 (two) times daily as needed (itching). As directed by P.A. Lennie Odor with Dr. Allyson Sabal   warfarin 2.5 MG tablet Commonly known as:  COUMADIN Take 1-2 tablets (2.5-5 mg total) by mouth See admin instructions. Take 1 tablet (2.5 mg) by mouth on Tuesday and Saturday nights, take 2 tablets (5 mg) on Sunday, Monday, Wednesday, Thursday, Friday nights or as directed by coumadin clinic What changed:  how much to take  how to take this  when to take this  additional instructions      Follow-up [place] Follow up.   Why:  [facility]- arranged- they will call you to set up home visits Contact information: [place] [address] [phone]        Mauricio Po, [place]. Schedule an appointment as soon as possible for a visit on 07/07/2016.   Specialty:  Family Medicine Why:  Hospital follow up.// Appointment is on 07/07/16 at 11:30am Contact information: [place] [address] [phone]            Time coordinating discharge: 35 minutes.  Signed:  RAMA,CHRISTINA  Pager [phone] [facility] 07/04/2016, 4:50 PM

## 2016-07-05 LAB — CULTURE, BLOOD (ROUTINE X 2)
CULTURE: NO GROWTH
Culture: NO GROWTH

## 2016-07-07 ENCOUNTER — Inpatient Hospital Stay: Payer: Medicare HMO | Admitting: Family

## 2016-07-09 NOTE — Progress Notes (Signed)
Subjective:    Patient ID: Wayne [name], male    DOB: [birth year], 83 y.o.   MRN: [id number]  Chief Complaint  Patient presents with  . Hospitalization Follow-up    follow up for the flu, has been having more frequent issues with bladder     HPI:  Wayne [name] is a 83 y.o. 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 and presents today for a follow up office visit following hospitalization.   Recently evaluated in the emergency department and admitted to the hospital feeling very weak and having a cough with reported yellow sputum. He was noted to have decreased oral intake 12 hours per his wife. Physical exam he was noted to be fatigued in appearance and somewhat slow to respond but answered questions appropriately. He had an intermittent moist cough and rales at the bases. He was noted to test positive for influenza A. EKG showed atrial fibrillation with left bundle branch block. Chest x-ray with new left upper lobe pneumonia and new bilateral lower lobe airspace disease may reflect infection as well. Influenza was treated with Tamiflu and did not respond with pneumonia treated with levofloxacin. He was discharged with 5 days of levofloxacin with recommended follow-up for INR monitoring. His atrial fibrillation remained rate controlled and anticoagulated with Coumadin. Blood pressure remained well controlled. He was started on Flomax. Hospitalist recommends close follow-up of PT/INR with home health physical therapy set up. All hospital records, labs, and imaging reviewed in detail.  Since leaving the hospital reports no further episodes of fevers. Does continue to have a cough and sputum production.  Reports taking his medications as prescribed  and notes the levofloxacin resulted in an "eating" type feeling in his back that was relieved with Benadryl. He is able  to complete his activities of daily living back to baseline. Does continue to have urinary symptoms described as urinary frequency and some incontinence. Timing of the symptom is generally worse at night. Was prescribed Flomax which he stopped secondary to the concern for reaction with levofloxacin.  Allergies  Allergen Reactions  . Procaine Hcl Other (See Comments)    Passed out after being given this at dental appointment  . Tylenol [Acetaminophen] Other (See Comments)    Makes him very sleepy if he takes more than one tablet  . Penicillins Rash    Has patient had a PCN reaction causing immediate rash, facial/tongue/throat swelling, SOB or lightheadedness with hypotension: Yes Has patient had a PCN reaction causing severe rash involving mucus membranes or skin necrosis: No Has patient had a PCN reaction that required hospitalization No Has patient had a PCN reaction occurring within the last 10 years: No If all of the above answers are "NO", then may proceed with Cephalosporin use.      Outpatient Medications Prior to Visit  Medication Sig Dispense Refill  . acetaminophen (TYLENOL) 500 MG tablet Take 500 mg by mouth daily as needed (pain).    Marland Kitchen diclofenac sodium (VOLTAREN) 1 % GEL Apply 2 g topically 4 (four) times daily. 100 g 1  . digoxin (LANOXIN) 0.125 MG tablet Take 1 tablet (125 mcg total) by mouth daily. 90 tablet 3  . Glucosamine HCl 1500 MG TABS Take 1,500 mg by mouth 2 (two) times daily.    Marland Kitchen guaiFENesin (MUCINEX) 600 MG 12  hr tablet Take 600 mg by mouth 2 (two) times daily as needed for cough or to loosen phlegm.     . metoprolol tartrate (LOPRESSOR) 25 MG tablet Take 0.5-1 tablets (12.5-25 mg total) by mouth See admin instructions. Take 1 tablet (25 mg) by mouth every morning and 1/2 tablet (12.5 mg) at night    . oseltamivir (TAMIFLU) 30 MG capsule Take 1  capsule (30 mg total) by mouth 2 (two) times daily. 3 capsule 0  . polyvinyl alcohol (ARTIFICIAL TEARS) 1.4 % ophthalmic solution Place 1 drop into both eyes daily.    . Saw Palmetto, Serenoa repens, (SAW PALMETTO BERRY PO) Take 1 tablet by mouth 2 (two) times daily.     . tamsulosin (FLOMAX) 0.4 MG CAPS capsule Take 1 capsule (0.4 mg total) by mouth daily. 30 capsule 0  . triamcinolone (KENALOG) 0.1 % cream Apply 1 application topically 2 (two) times daily as needed (itching). As directed by P.A. Lennie Odor with Dr. Allyson Sabal     . warfarin (COUMADIN) 2.5 MG tablet Take 1-2 tablets (2.5-5 mg total) by mouth See admin instructions. Take 1 tablet (2.5 mg) by mouth on Tuesday and Saturday nights, take 2 tablets (5 mg) on Sunday, Monday, Wednesday, Thursday, Friday nights or as directed by coumadin clinic    . levofloxacin (LEVAQUIN) 500 MG tablet Take 1 tablet (500 mg total) by mouth daily. 5 tablet 0   No facility-administered medications prior to visit.       Past Surgical History:  Procedure Laterality Date  . CORONARY ANGIOPLASTY  2007   Distal OM  . PROSTATE BIOPSY        Past Medical History:  Diagnosis Date  . Arthritis   . Atrial fibrillation ([facility])    Chronic  . BPH (benign prostatic hyperplasia)   . CAD (coronary artery disease)   . Chronic anticoagulation   . History of chicken pox   . HTN (hypertension)   . Old MI (myocardial infarction) 2007   s/p PCI to distal OM (no stent)  . 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  HENT: Positive for congestion.   Respiratory: Positive for cough. Negative for chest tightness, shortness of breath and wheezing.   Cardiovascular: Negative for chest pain, palpitations and leg swelling.  Genitourinary: Positive for frequency. Negative for difficulty urinating, discharge, dysuria, penile pain and urgency.      Objective:    BP 136/76 (BP Location: Left Arm, Patient Position:  Sitting, Cuff Size: Normal)   Pulse 76   Temp 97.4 F (36.3 C) (Oral)   Resp 16   Ht 5\' 11"  (1.803 m)   Wt 190 lb (86.2 kg)   SpO2 97%   BMI 26.50 kg/m  Nursing note and vital signs reviewed.  Physical Exam  Constitutional: He is oriented to person, place, and time. He appears well-developed and well-nourished. No distress.  Cardiovascular: Normal rate, regular rhythm, normal heart sounds and intact distal pulses.   Pulmonary/Chest: Effort normal and breath sounds normal. No respiratory distress. He has no wheezes. He has no rales. He exhibits no tenderness.  Neurological: He is alert and oriented to person, place, and time.  Skin: Skin is warm and dry.  Psychiatric: He has a normal mood and affect. His behavior is normal. Judgment and thought content normal.       Assessment & Plan:   Problem List Items Addressed This Visit  Respiratory   Influenza A    Treated with Tamilfu and appears resolved with no further symptoms. Follow up as needed or if symptoms return.       CAP (community acquired pneumonia) - Primary    Recently had mild reaction to levofloxacin that was managed with diphenhydramine. Appears to be improving slowly with mild set back today. Recommend continued OTC medication as needed for symptom relief and supportive care. Consider additional antibiotics if symptoms return or worsen.         Other   Urinary frequency    This is a new problem. Urinary frequency with concern for benign prostate hypertrophy currently maintained on Flomax with continued symptoms of nighttime urination. Continue current dosage of Flomax. Consider possible anticholinergic pending postvoid residual or finasteride. Continue to monitor and follow-up if symptoms worsen or do not improve.          I have discontinued Mr. Busler levofloxacin. I am also having him maintain his (Saw Palmetto, Serenoa repens, (SAW PALMETTO BERRY PO)), triamcinolone cream, guaiFENesin, digoxin,  diclofenac sodium, Glucosamine HCl, acetaminophen, polyvinyl alcohol, metoprolol tartrate, oseltamivir, tamsulosin, and warfarin.   Follow-up: Return in about 2 months (around 09/07/2016), or if symptoms worsen or fail to improve.  Mauricio Po, FNP

## 2016-07-10 ENCOUNTER — Ambulatory Visit (INDEPENDENT_AMBULATORY_CARE_PROVIDER_SITE_OTHER): Payer: Medicare HMO | Admitting: Pharmacist

## 2016-07-10 ENCOUNTER — Encounter: Payer: Self-pay | Admitting: Family

## 2016-07-10 ENCOUNTER — Ambulatory Visit (INDEPENDENT_AMBULATORY_CARE_PROVIDER_SITE_OTHER): Payer: Medicare HMO | Admitting: Family

## 2016-07-10 VITALS — BP 136/76 | HR 76 | Temp 97.4°F | Resp 16 | Ht 71.0 in | Wt 190.0 lb

## 2016-07-10 DIAGNOSIS — J101 Influenza due to other identified influenza virus with other respiratory manifestations: Secondary | ICD-10-CM | POA: Diagnosis not present

## 2016-07-10 DIAGNOSIS — I482 Chronic atrial fibrillation, unspecified: Secondary | ICD-10-CM

## 2016-07-10 DIAGNOSIS — J181 Lobar pneumonia, unspecified organism: Secondary | ICD-10-CM | POA: Diagnosis not present

## 2016-07-10 DIAGNOSIS — I6350 Cerebral infarction due to unspecified occlusion or stenosis of unspecified cerebral artery: Secondary | ICD-10-CM

## 2016-07-10 DIAGNOSIS — I4891 Unspecified atrial fibrillation: Secondary | ICD-10-CM | POA: Diagnosis not present

## 2016-07-10 DIAGNOSIS — J189 Pneumonia, unspecified organism: Secondary | ICD-10-CM

## 2016-07-10 DIAGNOSIS — R350 Frequency of micturition: Secondary | ICD-10-CM

## 2016-07-10 DIAGNOSIS — Z5181 Encounter for therapeutic drug level monitoring: Secondary | ICD-10-CM | POA: Diagnosis not present

## 2016-07-10 LAB — POCT INR: INR: 2.3

## 2016-07-10 NOTE — Assessment & Plan Note (Signed)
Recently had mild reaction to levofloxacin that was managed with diphenhydramine. Appears to be improving slowly with mild set back today. Recommend continued OTC medication as needed for symptom relief and supportive care. Consider additional antibiotics if symptoms return or worsen.

## 2016-07-10 NOTE — Patient Instructions (Addendum)
Thank you for choosing [facility].  SUMMARY AND INSTRUCTIONS:  Medication:  Please continue to take your medications as prescribed.  Please continue with Flomax. If your symptoms do not improve, please let us know.   Follow up:  If your symptoms worsen or fail to improve, please contact our office for further instruction, or in case of emergency go directly to the emergency room at the closest medical facility.   Benign Prostatic Hyperplasia An enlarged prostate (benign prostatic hyperplasia) is common in older men. You may experience the following:  Weak urine stream.  Dribbling.  Feeling like the bladder has not emptied completely.  Difficulty starting urination.  Getting up frequently at night to urinate.  Urinating more frequently during the day. HOME CARE INSTRUCTIONS  Monitor your prostatic hyperplasia for any changes. The following actions may help to alleviate any discomfort you are experiencing:  Give yourself time when you urinate.  Stay away from alcohol.  Avoid beverages containing caffeine, such as coffee, tea, and colas, because they can make the problem worse.  Avoid decongestants, antihistamines, and some prescription medicines that can make the problem worse.  Follow up with your health care provider for further treatment as recommended. SEEK MEDICAL CARE IF:  You are experiencing progressive difficulty voiding.  Your urine stream is progressively getting narrower.  You are awaking from sleep with the urge to void more frequently.  You are constantly feeling the need to void.  You experience loss of urine, especially in small amounts. SEEK IMMEDIATE MEDICAL CARE IF:   You develop increased pain with urination or are unable to urinate.  You develop severe abdominal pain, vomiting, a high fever, or fainting.  You develop back pain or blood in your urine. MAKE SURE YOU:   Understand these instructions.  Will watch your  condition.  Will get help right away if you are not doing well or get worse. This information is not intended to replace advice given to you by your health care provider. Make sure you discuss any questions you have with your health care provider. Document Released: 06/05/2005 Document Revised: 06/26/2014 Document Reviewed: 11/05/2012 Elsevier Interactive Patient Education  2017 [employer].

## 2016-07-10 NOTE — Assessment & Plan Note (Signed)
Treated with Tamilfu and appears resolved with no further symptoms. Follow up as needed or if symptoms return.

## 2016-07-10 NOTE — Assessment & Plan Note (Addendum)
This is a new problem. Urinary frequency with concern for benign prostate hypertrophy currently maintained on Flomax with continued symptoms of nighttime urination. Continue current dosage of Flomax. Consider possible anticholinergic pending postvoid residual or finasteride. Continue to monitor and follow-up if symptoms worsen or do not improve.

## 2016-07-14 ENCOUNTER — Encounter: Payer: Self-pay | Admitting: Family

## 2016-07-20 DIAGNOSIS — 419620001 Death: Secondary | SNOMED CT | POA: Diagnosis not present

## 2016-07-20 DEATH — deceased

## 2016-07-31 ENCOUNTER — Ambulatory Visit (INDEPENDENT_AMBULATORY_CARE_PROVIDER_SITE_OTHER): Payer: Medicare HMO | Admitting: Pharmacist Clinician (PhC)/ Clinical Pharmacy Specialist

## 2016-07-31 DIAGNOSIS — I482 Chronic atrial fibrillation, unspecified: Secondary | ICD-10-CM

## 2016-07-31 DIAGNOSIS — I4891 Unspecified atrial fibrillation: Secondary | ICD-10-CM | POA: Diagnosis not present

## 2016-07-31 DIAGNOSIS — I6350 Cerebral infarction due to unspecified occlusion or stenosis of unspecified cerebral artery: Secondary | ICD-10-CM | POA: Diagnosis not present

## 2016-07-31 DIAGNOSIS — Z5181 Encounter for therapeutic drug level monitoring: Secondary | ICD-10-CM | POA: Diagnosis not present

## 2016-07-31 LAB — POCT INR: INR: 1.4

## 2016-08-07 ENCOUNTER — Encounter: Payer: Self-pay | Admitting: Cardiology

## 2016-08-14 NOTE — Progress Notes (Signed)
HPI: FU atrial fibrillation and CAD; previously followed by Dr Mare Ferrari. Cardiac catheterization 2007 showed ejection fraction 50-55%, 100% mid to distal obtuse marginal. Patient had PCI of the OM at that time. 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 Echocardiogram August 2017 showed normal LV systolic function with akinesis of the mid apical inferior lateral myocardium. There was moderate aortic stenosis with mean gradient 30 mmHg. Biatrial enlargement with mild mitral regurgitation. Recently admitted with influenza/pneumonia. Since last seen, patient is improving from his recent illness. He denies dyspnea, chest pain, palpitations or syncope. No bleeding.   Current Outpatient Prescriptions  Medication Sig Dispense Refill  . acetaminophen (TYLENOL) 500 MG tablet Take 500 mg by mouth daily as needed (pain).    Marland Kitchen digoxin (LANOXIN) 0.125 MG tablet Take 1 tablet (125 mcg total) by mouth daily. 90 tablet 3  . Glucosamine-Chondroit-Vit C-Mn (GLUCOSAMINE CHONDR 1500 COMPLX PO) Take 1 tablet by mouth daily.    Marland Kitchen guaiFENesin (MUCINEX) 600 MG 12 hr tablet Take 600 mg by mouth 2 (two) times daily as needed for cough or to loosen phlegm.     . metoprolol tartrate (LOPRESSOR) 25 MG tablet Take 0.5-1 tablets (12.5-25 mg total) by mouth See admin instructions. Take 1 tablet (25 mg) by mouth every morning and 1/2 tablet (12.5 mg) at night    . Saw Palmetto, Serenoa repens, (SAW PALMETTO BERRY PO) Take 1 tablet by mouth 2 (two) times daily.     . tamsulosin (FLOMAX) 0.4 MG CAPS capsule Take 1 capsule (0.4 mg total) by mouth daily. 30 capsule 0  . triamcinolone (KENALOG) 0.1 % cream Apply 1 application topically 2 (two) times daily as needed (itching). As directed by P.A. Lennie Odor with Dr. Allyson Sabal     . warfarin (COUMADIN) 2.5 MG tablet Take 1-2 tablets (2.5-5 mg total) by mouth See admin instructions. Take 1 tablet (2.5 mg) by mouth on Tuesday and Saturday nights,  take 2 tablets (5 mg) on Sunday, Monday, Wednesday, Thursday, Friday nights or as directed by coumadin clinic     No current facility-administered medications for this visit.      Past Medical History:  Diagnosis Date  . Arthritis   . Atrial fibrillation ([facility])    Chronic  . BPH (benign prostatic hyperplasia)   . CAD (coronary artery disease)   . Chronic anticoagulation   . History of chicken pox   . HTN (hypertension)   . Old MI (myocardial infarction) 2007   s/p PCI to distal OM (no stent)  . Stroke, embolic [facility])     Past Surgical History:  Procedure Laterality Date  . CORONARY ANGIOPLASTY  2007   Distal OM  . PROSTATE BIOPSY      Social History   Social History  . Marital status: Married    Spouse name: N/A  . Number of children: 3  . Years of education: 24   Occupational History  . Not on file.   Social History Main Topics  . Smoking status: Never Smoker  . Smokeless tobacco: Never Used  . Alcohol use No  . Drug use: No  . Sexual activity: Not on file   Other Topics Concern  . Not on file   Social History Narrative   Fun: Garden, work in the yard, anything physical and walk daily    Family History  Problem Relation Age of Onset  . Heart disease Mother   . Heart attack Mother   .  Heart disease Father   . Heart attack Father   . Heart attack Brother   . Heart attack Sister   . Stroke Brother     ROS: Weakness from recent influenza infection but no fevers or chills, productive cough, hemoptysis, dysphasia, odynophagia, melena, hematochezia, dysuria, hematuria, rash, seizure activity, orthopnea, PND, pedal edema, claudication. Remaining systems are negative.  Physical Exam: Well-developed well-nourished in no acute distress.  Skin is warm and dry.  HEENT is normal.  Neck is supple.  Chest is clear to auscultation with normal expansion.  Cardiovascular exam is irregular, 3/6 systolic murmur left sternal border.  Abdominal exam nontender or  distended. No masses palpated. Extremities show no edema. neuro grossly intact  ECG-06/30/2016-atrial fibrillation with nonspecific ST changes; personally reviewed.  A/P  1 Permanent atrial fibrillation-continue digoxin and metoprolol for rate control. Continue Coumadin. Pt given names of DOACS and will contact us if he would like to change.  2 moderate aortic stenosis-plan follow-up echocardiogram August 2018. I have explained that he will possibly require aortic valve replacement in the future.  3 coronary artery disease-no aspirin given need for Coumadin. Patient declines statins.  4 hypertension-blood pressure controlled. Continue present medications.   Kirk Ruths, MD

## 2016-08-17 ENCOUNTER — Ambulatory Visit (INDEPENDENT_AMBULATORY_CARE_PROVIDER_SITE_OTHER): Payer: Medicare HMO | Admitting: Cardiology

## 2016-08-17 ENCOUNTER — Ambulatory Visit (INDEPENDENT_AMBULATORY_CARE_PROVIDER_SITE_OTHER): Payer: Medicare HMO | Admitting: Pharmacist

## 2016-08-17 ENCOUNTER — Encounter: Payer: Self-pay | Admitting: Cardiology

## 2016-08-17 VITALS — BP 120/70 | HR 62 | Ht 71.0 in | Wt 188.0 lb

## 2016-08-17 DIAGNOSIS — I6350 Cerebral infarction due to unspecified occlusion or stenosis of unspecified cerebral artery: Secondary | ICD-10-CM

## 2016-08-17 DIAGNOSIS — I4891 Unspecified atrial fibrillation: Secondary | ICD-10-CM

## 2016-08-17 DIAGNOSIS — Z5181 Encounter for therapeutic drug level monitoring: Secondary | ICD-10-CM

## 2016-08-17 DIAGNOSIS — I482 Chronic atrial fibrillation, unspecified: Secondary | ICD-10-CM

## 2016-08-17 DIAGNOSIS — I350 Nonrheumatic aortic (valve) stenosis: Secondary | ICD-10-CM

## 2016-08-17 DIAGNOSIS — I2510 Atherosclerotic heart disease of native coronary artery without angina pectoris: Secondary | ICD-10-CM

## 2016-08-17 LAB — POCT INR: INR: 3.1

## 2016-08-17 NOTE — Patient Instructions (Signed)
Your physician wants you to follow-up in: 6 MONTHS WITH DR CRENSHAW You will receive a reminder letter in the mail two months in advance. If you don't receive a letter, please call our office to schedule the follow-up appointment.   If you need a refill on your cardiac medications before your next appointment, please call your pharmacy.  

## 2016-08-22 ENCOUNTER — Telehealth: Payer: Self-pay | Admitting: Family

## 2016-08-22 NOTE — Telephone Encounter (Signed)
Ok with me 

## 2016-08-22 NOTE — Telephone Encounter (Addendum)
Patient states that he received bill on 1/22 for 40.00 out of network charge for [facility].  Thought that bill would be filed under [facility].  Please advise.

## 2016-08-22 NOTE — Telephone Encounter (Signed)
Patient states that he would like to transfer from [facility] to Dr. Ronnald Ramp b/c of billing issues with insurance.  Please advise.

## 2016-08-26 NOTE — Telephone Encounter (Signed)
Ok with me 

## 2016-08-27 NOTE — Telephone Encounter (Signed)
Sent email to [employer] to confirm the $40.00 balance from 07/10/16 with [name].  Please note, if you talk to the patient, that response history is showing that Wayne [name] has his PCP as [facility].

## 2016-08-29 DIAGNOSIS — D0422 Carcinoma in situ of skin of left ear and external auricular canal: Secondary | ICD-10-CM | POA: Diagnosis not present

## 2016-08-29 DIAGNOSIS — Z85828 Personal history of other malignant neoplasm of skin: Secondary | ICD-10-CM | POA: Diagnosis not present

## 2016-08-29 DIAGNOSIS — L570 Actinic keratosis: Secondary | ICD-10-CM | POA: Diagnosis not present

## 2016-08-29 DIAGNOSIS — D485 Neoplasm of uncertain behavior of skin: Secondary | ICD-10-CM | POA: Diagnosis not present

## 2016-08-29 DIAGNOSIS — L821 Other seborrheic keratosis: Secondary | ICD-10-CM | POA: Diagnosis not present

## 2016-08-29 NOTE — Telephone Encounter (Signed)
Left vm for patient to call back to schedule

## 2016-08-30 NOTE — Telephone Encounter (Signed)
Got patient scheduled

## 2016-09-04 ENCOUNTER — Ambulatory Visit (INDEPENDENT_AMBULATORY_CARE_PROVIDER_SITE_OTHER): Payer: Medicare HMO | Admitting: Pharmacist

## 2016-09-04 DIAGNOSIS — I482 Chronic atrial fibrillation, unspecified: Secondary | ICD-10-CM

## 2016-09-04 DIAGNOSIS — I6350 Cerebral infarction due to unspecified occlusion or stenosis of unspecified cerebral artery: Secondary | ICD-10-CM | POA: Diagnosis not present

## 2016-09-04 DIAGNOSIS — Z5181 Encounter for therapeutic drug level monitoring: Secondary | ICD-10-CM | POA: Diagnosis not present

## 2016-09-04 DIAGNOSIS — I4891 Unspecified atrial fibrillation: Secondary | ICD-10-CM | POA: Diagnosis not present

## 2016-09-04 DIAGNOSIS — M5442 Lumbago with sciatica, left side: Secondary | ICD-10-CM | POA: Diagnosis not present

## 2016-09-04 DIAGNOSIS — M48061 Spinal stenosis, lumbar region without neurogenic claudication: Secondary | ICD-10-CM | POA: Diagnosis not present

## 2016-09-04 LAB — POCT INR: INR: 3

## 2016-09-12 DIAGNOSIS — M9905 Segmental and somatic dysfunction of pelvic region: Secondary | ICD-10-CM | POA: Diagnosis not present

## 2016-09-12 DIAGNOSIS — M461 Sacroiliitis, not elsewhere classified: Secondary | ICD-10-CM | POA: Diagnosis not present

## 2016-09-12 DIAGNOSIS — M9904 Segmental and somatic dysfunction of sacral region: Secondary | ICD-10-CM | POA: Diagnosis not present

## 2016-09-12 DIAGNOSIS — M9903 Segmental and somatic dysfunction of lumbar region: Secondary | ICD-10-CM | POA: Diagnosis not present

## 2016-09-12 DIAGNOSIS — M5137 Other intervertebral disc degeneration, lumbosacral region: Secondary | ICD-10-CM | POA: Diagnosis not present

## 2016-09-12 DIAGNOSIS — M25552 Pain in left hip: Secondary | ICD-10-CM | POA: Diagnosis not present

## 2016-09-13 ENCOUNTER — Other Ambulatory Visit (INDEPENDENT_AMBULATORY_CARE_PROVIDER_SITE_OTHER): Payer: Medicare HMO

## 2016-09-13 ENCOUNTER — Encounter: Payer: Self-pay | Admitting: Internal Medicine

## 2016-09-13 ENCOUNTER — Ambulatory Visit (INDEPENDENT_AMBULATORY_CARE_PROVIDER_SITE_OTHER): Payer: Medicare HMO | Admitting: Internal Medicine

## 2016-09-13 VITALS — BP 122/80 | HR 69 | Temp 98.0°F | Resp 97 | Ht 71.0 in | Wt 189.0 lb

## 2016-09-13 DIAGNOSIS — E876 Hypokalemia: Secondary | ICD-10-CM

## 2016-09-13 DIAGNOSIS — I482 Chronic atrial fibrillation, unspecified: Secondary | ICD-10-CM

## 2016-09-13 DIAGNOSIS — I10 Essential (primary) hypertension: Secondary | ICD-10-CM | POA: Diagnosis not present

## 2016-09-13 DIAGNOSIS — N401 Enlarged prostate with lower urinary tract symptoms: Secondary | ICD-10-CM | POA: Diagnosis not present

## 2016-09-13 DIAGNOSIS — N138 Other obstructive and reflux uropathy: Secondary | ICD-10-CM

## 2016-09-13 LAB — BASIC METABOLIC PANEL
BUN: 13 mg/dL (ref 6–23)
CHLORIDE: 99 meq/L (ref 96–112)
CO2: 26 meq/L (ref 19–32)
Calcium: 9.2 mg/dL (ref 8.4–10.5)
Creatinine, Ser: 0.87 mg/dL (ref 0.40–1.50)
GFR: 88.63 mL/min (ref >60.00)
GLUCOSE: 98 mg/dL (ref 70–99)
Potassium: 4.1 mEq/L (ref 3.5–5.1)
SODIUM: 132 meq/L — AB (ref 135–145)

## 2016-09-13 LAB — MAGNESIUM: Magnesium: 2.4 mg/dL (ref 1.5–2.5)

## 2016-09-13 MED ORDER — TAMSULOSIN HCL 0.4 MG PO CAPS: 0.4000 mg | ORAL_CAPSULE | Freq: Every day | ORAL | 3 refills | Status: DC

## 2016-09-13 NOTE — Progress Notes (Signed)
Subjective:  Patient ID: Wayne [name], male    DOB: September 18, 1929  Age: 83 y.o. MRN: [id number]  CC: Hypertension and Atrial Fibrillation   HPI Wayne [name] presents for Follow-up. He is being treated for right sided sciatica by a chiropractor and orthopedics and is in some pain today but doesn't want me to make any recommendations for pain relief. He wants to establish with a new PCP. He complains of a weak urine stream with straining and wants a refill on Flomax..  Outpatient Medications Prior to Visit  Medication Sig Dispense Refill  . Cholecalciferol (VITAMIN D3) 2000 units TABS Take 2,000 Units by mouth daily.    . Cyanocobalamin (VITAMIN B 12 PO) Take 1 tablet by mouth daily.    . digoxin (LANOXIN) 0.125 MG tablet Take 1 tablet (125 mcg total) by mouth daily. 90 tablet 3  . metoprolol tartrate (LOPRESSOR) 25 MG tablet Take 0.5-1 tablets (12.5-25 mg total) by mouth See admin instructions. Take 1 tablet (25 mg) by mouth every morning and 1/2 tablet (12.5 mg) at night    . triamcinolone (KENALOG) 0.1 % cream Apply 1 application topically 2 (two) times daily as needed (itching). As directed by P.A. Lennie Odor with Dr. Allyson Sabal     . warfarin (COUMADIN) 2.5 MG tablet Take 1-2 tablets (2.5-5 mg total) by mouth See admin instructions. Take 1 tablet (2.5 mg) by mouth on Tuesday and Saturday nights, take 2 tablets (5 mg) on Sunday, Monday, Wednesday, Thursday, Friday nights or as directed by coumadin clinic    . tamsulosin (FLOMAX) 0.4 MG CAPS capsule Take 1 capsule (0.4 mg total) by mouth daily. 30 capsule 0  . acetaminophen (TYLENOL) 500 MG tablet Take 500 mg by mouth daily as needed (pain).    . Glucosamine-Chondroit-Vit C-Mn (GLUCOSAMINE CHONDR 1500 COMPLX PO) Take 1 tablet by mouth daily.    Marland Kitchen guaiFENesin (MUCINEX) 600 MG 12 hr tablet Take 600 mg by mouth 2 (two) times daily as needed for cough or to loosen phlegm.     . Saw Palmetto, Serenoa repens, (SAW PALMETTO BERRY PO)  Take 1 tablet by mouth 2 (two) times daily.      No facility-administered medications prior to visit.     ROS Review of Systems  Constitutional: Negative.  Negative for diaphoresis, fatigue and unexpected weight change.  HENT: Negative.  Negative for trouble swallowing.   Eyes: Negative for visual disturbance.  Respiratory: Negative for cough, chest tightness, shortness of breath and wheezing.   Cardiovascular: Negative for chest pain, palpitations and leg swelling.  Gastrointestinal: Negative for abdominal pain, constipation, diarrhea, nausea and vomiting.  Endocrine: Negative.   Genitourinary: Positive for difficulty urinating. Negative for dysuria, flank pain, frequency, hematuria and urgency.  Musculoskeletal: Positive for arthralgias. Negative for myalgias and neck pain.  Skin: Negative.   Allergic/Immunologic: Negative.   Neurological: Negative.  Negative for dizziness.  Hematological: Negative for adenopathy. Does not bruise/bleed easily.  Psychiatric/Behavioral: Negative.     Objective:  BP 122/80 (BP Location: Right Arm, Patient Position: Lying right side, Cuff Size: Normal)   Pulse 69   Temp 98 F (36.7 C) (Oral)   Resp (!) 97   Ht 5\' 11"  (1.803 m)   Wt 189 lb (85.7 kg)   BMI 26.36 kg/m   BP Readings from Last 3 Encounters:  09/15/16 (!) 155/87  09/13/16 122/80  08/17/16 120/70    Wt Readings from Last 3 Encounters:  09/15/16 185 lb (83.9 kg)  09/13/16 189 lb (  85.7 kg)  08/17/16 188 lb (85.3 kg)    Physical Exam  Constitutional: He is oriented to person, place, and time. No distress.  HENT:  Mouth/Throat: Oropharynx is clear and moist. No oropharyngeal exudate.  Eyes: Conjunctivae are normal. Right eye exhibits no discharge. Left eye exhibits no discharge. No scleral icterus.  Neck: Normal range of motion. Neck supple. No JVD present. No tracheal deviation present. No thyromegaly present.  Cardiovascular: Normal rate, regular rhythm, normal heart sounds  and intact distal pulses.  Exam reveals no gallop and no friction rub.   No murmur heard. Pulmonary/Chest: Effort normal and breath sounds normal. No stridor. No respiratory distress. He has no wheezes. He has no rales. He exhibits no tenderness.  Abdominal: Soft. Bowel sounds are normal. He exhibits no distension and no mass. There is no tenderness. There is no rebound and no guarding.  Musculoskeletal: Normal range of motion. He exhibits no edema, tenderness or deformity.  Lymphadenopathy:    He has no cervical adenopathy.  Neurological: He is oriented to person, place, and time.  Skin: Skin is warm and dry. No rash noted. He is not diaphoretic. No erythema. No pallor.  Vitals reviewed.   Lab Results  Component Value Date   WBC 5.3 07/04/2016   HGB 13.0 07/04/2016   HCT 38.6 (L) 07/04/2016   PLT 122 (L) 07/04/2016   GLUCOSE 104 (H) 09/15/2016   CHOL 176 01/27/2016   TRIG 274 (H) 01/27/2016   HDL 29 (L) 01/27/2016   LDLDIRECT 87.0 05/18/2014   LDLCALC 92 01/27/2016   ALT 16 (L) 06/30/2016   AST 28 06/30/2016   NA 128 (L) 09/15/2016   K 3.8 09/15/2016   CL 96 (L) 09/15/2016   CREATININE 0.93 09/15/2016   BUN 14 09/15/2016   CO2 24 09/15/2016   INR 3.0 09/04/2016    Dg Chest 2 View  Result Date: 06/30/2016 CLINICAL DATA:  Cough, shortness of breath. EXAM: CHEST  2 VIEW COMPARISON:  Radiographs of August 09, 2011. FINDINGS: Stable cardiomediastinal silhouette. Atherosclerosis of thoracic aorta is noted. No pneumothorax or pleural effusion is noted. No acute pulmonary disease is noted. Bony thorax is unremarkable. IMPRESSION: No active cardiopulmonary disease. Electronically Signed   By: Marijo Conception, M.D.   On: 06/30/2016 12:55    Assessment & Plan:   [name] was seen today for hypertension and atrial fibrillation.  Diagnoses and all orders for this visit:  Essential hypertension- his blood pressure is adequately well controlled, electrolytes and renal function are  stable. -     Basic metabolic panel; Future -     Magnesium; Future  Hypokalemia- his potassium level is in the normal range now -     Basic metabolic panel; Future -     Magnesium; Future  Chronic atrial fibrillation ([place])- he has good rate and rhythm control  BPH with obstruction/lower urinary tract symptoms- will continue Flomax -     tamsulosin (FLOMAX) 0.4 MG CAPS capsule; Take 1 capsule (0.4 mg total) by mouth daily.   I have discontinued Mr. Gil (Saw Palmetto, Serenoa repens, (SAW PALMETTO BERRY PO)), guaiFENesin, acetaminophen, and Glucosamine-Chondroit-Vit C-Mn (GLUCOSAMINE CHONDR 1500 COMPLX PO). I am also having him maintain his triamcinolone cream, digoxin, metoprolol tartrate, warfarin, Vitamin D3, Cyanocobalamin (VITAMIN B 12 PO), and tamsulosin.  Meds ordered this encounter  Medications  . tamsulosin (FLOMAX) 0.4 MG CAPS capsule    Sig: Take 1 capsule (0.4 mg total) by mouth daily.    Dispense:  90  capsule    Refill:  3     Follow-up: Return if symptoms worsen or fail to improve.  Scarlette Calico, MD

## 2016-09-13 NOTE — Progress Notes (Signed)
Pre visit review using our clinic review tool, if applicable. No additional management support is needed unless otherwise documented below in the visit note. 

## 2016-09-13 NOTE — Patient Instructions (Signed)

## 2016-09-15 ENCOUNTER — Emergency Department (HOSPITAL_BASED_OUTPATIENT_CLINIC_OR_DEPARTMENT_OTHER)
Admission: EM | Admit: 2016-09-15 | Discharge: 2016-09-15 | Disposition: A | Discharge status: Home / Self Care | Payer: Medicare HMO | Attending: Emergency Medicine | Admitting: Emergency Medicine

## 2016-09-15 ENCOUNTER — Encounter (HOSPITAL_BASED_OUTPATIENT_CLINIC_OR_DEPARTMENT_OTHER): Payer: Self-pay

## 2016-09-15 DIAGNOSIS — R34 Anuria and oliguria: Secondary | ICD-10-CM | POA: Insufficient documentation

## 2016-09-15 DIAGNOSIS — R531 Weakness: Secondary | ICD-10-CM | POA: Diagnosis not present

## 2016-09-15 DIAGNOSIS — Z7901 Long term (current) use of anticoagulants: Secondary | ICD-10-CM | POA: Diagnosis not present

## 2016-09-15 DIAGNOSIS — R300 Dysuria: Secondary | ICD-10-CM | POA: Insufficient documentation

## 2016-09-15 DIAGNOSIS — R339 Retention of urine, unspecified: Secondary | ICD-10-CM | POA: Diagnosis not present

## 2016-09-15 DIAGNOSIS — Z79899 Other long term (current) drug therapy: Secondary | ICD-10-CM | POA: Diagnosis not present

## 2016-09-15 DIAGNOSIS — I10 Essential (primary) hypertension: Secondary | ICD-10-CM | POA: Diagnosis not present

## 2016-09-15 DIAGNOSIS — I2510 Atherosclerotic heart disease of native coronary artery without angina pectoris: Secondary | ICD-10-CM | POA: Insufficient documentation

## 2016-09-15 LAB — BASIC METABOLIC PANEL
Anion gap: 8 (ref 5–15)
BUN: 14 mg/dL (ref 6–20)
CHLORIDE: 96 mmol/L — AB (ref 101–111)
CO2: 24 mmol/L (ref 22–32)
CREATININE: 0.93 mg/dL (ref 0.61–1.24)
Calcium: 8.6 mg/dL — ABNORMAL LOW (ref 8.9–10.3)
GFR calc Af Amer: >60 mL/min (ref >60)
GFR calc non Af Amer: >60 mL/min (ref >60)
GLUCOSE: 104 mg/dL — AB (ref 65–99)
Potassium: 3.8 mmol/L (ref 3.5–5.1)
SODIUM: 128 mmol/L — AB (ref 135–145)

## 2016-09-15 LAB — URINALYSIS, ROUTINE W REFLEX MICROSCOPIC
BILIRUBIN URINE: NEGATIVE
Glucose, UA: NEGATIVE mg/dL
Ketones, ur: NEGATIVE mg/dL
LEUKOCYTES UA: NEGATIVE
NITRITE: NEGATIVE
Protein, ur: NEGATIVE mg/dL
SPECIFIC GRAVITY, URINE: 1.018 (ref 1.005–1.030)
pH: 6 (ref 5.0–8.0)

## 2016-09-15 LAB — URINALYSIS, MICROSCOPIC (REFLEX): WBC UA: NONE SEEN WBC/hpf (ref 0–5)

## 2016-09-15 MED ORDER — OXYCODONE HCL 5 MG PO TABS
5.0000 mg | ORAL_TABLET | Freq: Once | ORAL | Status: AC
  Administered 2016-09-15: 2.5 mg via ORAL
  Filled 2016-09-15: qty 1

## 2016-09-15 NOTE — ED Notes (Signed)
ED Provider at bedside. 

## 2016-09-15 NOTE — ED Triage Notes (Signed)
c/o urinary retention x 4 days-NAD-presents to triage in w/c

## 2016-09-15 NOTE — ED Provider Notes (Signed)
[facility] Provider Note   CSN: [id number] Arrival date & time: 09/15/16  1715  By signing my name below, I, Hansel Feinstein, attest that this documentation has been prepared under the direction and in the presence of Alfonzo Beers, MD. Electronically Signed: Hansel Feinstein, ED Scribe. 09/15/16. 6:30 PM.   History   Chief Complaint Chief Complaint  Patient presents with  . Urinary Retention    HPI Wayne [name] is a 83 y.o. male with h/o BPH, CAD, HTN who presents to the Emergency Department complaining of intermittent difficulty urinating for 3-4 days. Pt states normally, he urinates 2-3 times per night and has not been able to with onset of his symptoms. He states this has disrupted his ability to sleep because he feels vaguely uncomfortable. He states that this morning he was able to urinate small amounts at a time and his bladder felt emptied temporarily, but this afternoon, he again was not able to urinate. No worsening or alleviating factors noted. Bladder scan performed in the ED showed >420mL. At his baseline, pt states he urinates very small amounts. Pt also notes that he has been experiencing BLE weakness with onset of his sciatic lower back pain weeks ago, but states he this has not worsened with his current symptoms. Wife reports the pt is able to ambulate and move up and down stairs without difficulty. He is followed by Dr. Gara Kroner for his sciatica. He denies lower abdominal pain, extremity numbness.   The history is provided by the patient. No language interpreter was used.  Illness  This is a new problem. The current episode started more than 2 days ago. The problem has been gradually worsening. Pertinent negatives include no abdominal pain. Nothing aggravates the symptoms. Relieved by: urination. He has tried nothing for the symptoms. The treatment provided no relief.    Past Medical History:  Diagnosis Date  . Arthritis   . Atrial fibrillation (HCC)    Chronic  . BPH (benign prostatic hyperplasia)   . CAD (coronary artery disease)   . Chronic anticoagulation   . History of chicken pox   . HTN (hypertension)   . Old MI (myocardial infarction) 2007   s/p PCI to distal OM (no stent)  . Stroke, embolic [facility])     Patient Active Problem List   Diagnosis Date Noted  . Hypokalemia 09/13/2016  . BPH with obstruction/lower urinary tract symptoms 09/13/2016  . Urinary frequency 07/10/2016  . Left bundle branch block (LBBB) on electrocardiogram 06/30/2016  . History of stroke 06/30/2016  . Generalized weakness 06/30/2016  . Osteoarthritis 04/04/2016  . Encounter for therapeutic drug monitoring 08/06/2013  . Chronic anticoagulation   . HTN (hypertension)   . CAD (coronary artery disease)   . Chronic atrial fibrillation ([place]) 09/13/2010  . Unspecified cerebral artery occlusion with cerebral infarction 09/13/2010    Past Surgical History:  Procedure Laterality Date  . CORONARY ANGIOPLASTY  2007   Distal OM  . PROSTATE BIOPSY         Home Medications    Prior to Admission medications   Medication Sig Start Date End Date Taking? Authorizing Provider  Cholecalciferol (VITAMIN D3) 2000 units TABS Take 2,000 Units by mouth daily.    Historical Provider, MD  Cyanocobalamin (VITAMIN B 12 PO) Take 1 tablet by mouth daily.    Historical Provider, MD  digoxin (LANOXIN) 0.125 MG tablet Take 1 tablet (125 mcg total) by mouth daily. 03/09/16   Lelon Perla, MD  metoprolol tartrate (  LOPRESSOR) 25 MG tablet Take 0.5-1 tablets (12.5-25 mg total) by mouth See admin instructions. Take 1 tablet (25 mg) by mouth every morning and 1/2 tablet (12.5 mg) at night 07/04/16   Venetia Maxon Rama, MD  tamsulosin (FLOMAX) 0.4 MG CAPS capsule Take 1 capsule (0.4 mg total) by mouth daily. 09/13/16   Janith Lima, MD  triamcinolone (KENALOG) 0.1 % cream Apply 1 application topically 2 (two) times daily as needed (itching). As directed by P.A. Lennie Odor with  Dr. Allyson Sabal     Historical Provider, MD  warfarin (COUMADIN) 2.5 MG tablet Take 1-2 tablets (2.5-5 mg total) by mouth See admin instructions. Take 1 tablet (2.5 mg) by mouth on Tuesday and Saturday nights, take 2 tablets (5 mg) on Sunday, Monday, Wednesday, Thursday, Friday nights or as directed by coumadin clinic 07/04/16   Venetia Maxon Rama, MD    Family History Family History  Problem Relation Age of Onset  . Heart disease Mother   . Heart attack Mother   . Heart disease Father   . Heart attack Father   . Heart attack Brother   . Heart attack Sister   . Stroke Brother     Social History Social History  Substance Use Topics  . Smoking status: Never Smoker  . Smokeless tobacco: Never Used  . Alcohol use No     Allergies   Prednisone; Procaine hcl; Tylenol [acetaminophen]; and Penicillins   Review of Systems Review of Systems  Gastrointestinal: Negative for abdominal pain.  Genitourinary: Positive for decreased urine volume and difficulty urinating.  Musculoskeletal: Positive for back pain.  Neurological: Positive for weakness (not worsened with current symptoms). Negative for numbness.  All other systems reviewed and are negative.  Physical Exam Updated Vital Signs BP (!) 155/87 (BP Location: Right Arm)   Pulse 93   Temp 98.2 F (36.8 C) (Oral)   Resp 14   Ht 5\' 10"  (1.778 m)   Wt 185 lb (83.9 kg)   SpO2 97%   BMI 26.54 kg/m  Vitals reviewed Physical Exam Physical Examination: General appearance - alert, well appearing, and in no distress Mental status - alert, oriented to person, place, and time Eyes - no conjunctival injection, no scleral icterus Chest - clear to auscultation, no wheezes, rales or rhonchi, symmetric air entry Heart - normal rate, regular rhythm, normal S1, S2, no murmurs, rubs, clicks or gallops Abdomen - soft, nontender, nondistended, no masses or organomegaly GU Male - no penile lesions or discharge, no testicular masses or tenderness, no  hernias Back exam - ttp in paraspinal lumbar spine bilaterally Neurological - alert, oriented, normal speech,  Strength 5/5 in bilateral lower extremities, no saddle anesthesia Extremities - peripheral pulses normal, no pedal edema, no clubbing or cyanosis Skin - normal coloration and turgor, no rashes  ED Treatments / Results   DIAGNOSTIC STUDIES: Oxygen Saturation is 95% on RA, adequate by my interpretation.    COORDINATION OF CARE: 6:25 PM Discussed treatment plan with pt at bedside which includes UA, blood work and pt agreed to plan.    Labs (all labs ordered are listed, but only abnormal results are displayed) Labs Reviewed  URINALYSIS, ROUTINE W REFLEX MICROSCOPIC - Abnormal; Notable for the following:       Result Value   Hgb urine dipstick SMALL (*)    All other components within normal limits  BASIC METABOLIC PANEL - Abnormal; Notable for the following:    Sodium 128 (*)    Chloride 96 (*)  Glucose, Bld 104 (*)    Calcium 8.6 (*)    All other components within normal limits  URINALYSIS, MICROSCOPIC (REFLEX) - Abnormal; Notable for the following:    Bacteria, UA RARE (*)    Squamous Epithelial / LPF 0-5 (*)    All other components within normal limits  URINE CULTURE    EKG  EKG Interpretation None       Radiology No results found.  Procedures Procedures (including critical care time)  Medications Ordered in ED Medications  oxyCODONE (Oxy IR/ROXICODONE) immediate release tablet 5 mg (2.5 mg Oral Given 09/15/16 2010)     Initial Impression / Assessment and Plan / ED Course  I have reviewed the triage vital signs and the nursing notes.  Pertinent labs & imaging results that were available during my care of the patient were reviewed by me and considered in my medical decision making (see chart for details).     Pt presenting with co urinary retention.  He is also being treated for sciatica by orthopedics however he does not have any lower extremity  weakness or saddle anesthesia on exam so feel his urinary retention is more likely due to prostate enlargement- he states his urine stream is very slow at baseline and he normally wakes up every 2 hours at night to urinate.  Foley catheter placed, labs reassuring, urine culture sent.  Pt to f/u with urology.  Discharged with strict return precautions.  Pt agreeable with plan.  Final Clinical Impressions(s) / ED Diagnoses   Final diagnoses:  Urinary retention    New Prescriptions Discharge Medication List as of 09/15/2016  8:07 PM     I personally performed the services described in this documentation, which was scribed in my presence. The recorded information has been reviewed and is accurate.     Alfonzo Beers, MD 09/16/16 [phone]

## 2016-09-15 NOTE — ED Notes (Signed)
Pt attempted to void in urinal x2 without result. MD and RN aware.

## 2016-09-15 NOTE — ED Notes (Signed)
Pt reports he his also being treated for sciatica pain and goes to a chiropractor.

## 2016-09-15 NOTE — ED Notes (Signed)
Leg bag applied; education for catheter care provided to wife.

## 2016-09-15 NOTE — Discharge Instructions (Signed)
Return to the ED with any concerns including fever/chills, decreased urine output, vomiting, abdominal pain, weakness of legs, blood or blood clots in catheter, or any other alarming symptoms

## 2016-09-17 LAB — URINE CULTURE: Culture: NO GROWTH

## 2016-09-18 DIAGNOSIS — R338 Other retention of urine: Secondary | ICD-10-CM | POA: Diagnosis not present

## 2016-09-19 ENCOUNTER — Other Ambulatory Visit: Payer: Self-pay | Admitting: Orthopedic Surgery

## 2016-09-19 DIAGNOSIS — M48061 Spinal stenosis, lumbar region without neurogenic claudication: Secondary | ICD-10-CM

## 2016-09-19 DIAGNOSIS — M5442 Lumbago with sciatica, left side: Secondary | ICD-10-CM | POA: Diagnosis not present

## 2016-09-20 ENCOUNTER — Telehealth: Payer: Self-pay | Admitting: *Deleted

## 2016-09-20 ENCOUNTER — Ambulatory Visit: Payer: Medicare HMO | Admitting: Internal Medicine

## 2016-09-20 NOTE — Telephone Encounter (Signed)
Hold coumadin 4 days prior to procedure and resume after; needs lovenox bridge Kirk Ruths

## 2016-09-20 NOTE — Telephone Encounter (Signed)
Per Charge Correction: [employer] error on claim for [facility] 07/10/16, claim resubmitted for in network benefits.

## 2016-09-20 NOTE — Telephone Encounter (Signed)
Will fax this note to the number provided. 

## 2016-09-20 NOTE — Telephone Encounter (Signed)
Patient is needing clearance for lumbar myelogram and he also needs the okay to hold his warfarin for 4 days prior to the procedure. Will forward for dr Stanford Breed review

## 2016-09-26 ENCOUNTER — Telehealth: Payer: Self-pay | Admitting: Cardiology

## 2016-09-26 DIAGNOSIS — R338 Other retention of urine: Secondary | ICD-10-CM | POA: Diagnosis not present

## 2016-09-26 NOTE — Telephone Encounter (Signed)
New Message  Pts wife voiced she would like to speak with nurse about pts upcoming procedure.  Pts wife voiced to call before 915 am or after lunch due to pt having other appts.  Please f/u

## 2016-09-26 NOTE — Telephone Encounter (Signed)
Returned call to wife (ok per DPR)-reports she was told by imaging company to call Dr. Lonia Skinner office in regards to Lovenox bridge.  Reports he is suppose to hold Coumadin 4 days prior.  Also reports they will need a PT test on Monday before procedure.  Advised I would route to pharmacist for review.    Wife aware and verbalized understanding.  (See telephone note 4/4)  Wife request a call back after lunch as they have an appt with the urologist this morning.

## 2016-09-26 NOTE — Telephone Encounter (Signed)
Spoke with wife - will see patient for bridging on Wednesday

## 2016-09-26 NOTE — Telephone Encounter (Signed)
LMOM; patient to call back.  Appointment for coumadin clinic re-scheduled to tomorrow to check INR and discuss bridge plan.    Need to confirm appointment with patient.

## 2016-09-27 ENCOUNTER — Ambulatory Visit (INDEPENDENT_AMBULATORY_CARE_PROVIDER_SITE_OTHER): Payer: Medicare HMO | Admitting: Pharmacist Clinician (PhC)/ Clinical Pharmacy Specialist

## 2016-09-27 DIAGNOSIS — I6350 Cerebral infarction due to unspecified occlusion or stenosis of unspecified cerebral artery: Secondary | ICD-10-CM

## 2016-09-27 DIAGNOSIS — I482 Chronic atrial fibrillation, unspecified: Secondary | ICD-10-CM

## 2016-09-27 DIAGNOSIS — Z5181 Encounter for therapeutic drug level monitoring: Secondary | ICD-10-CM

## 2016-09-27 DIAGNOSIS — I4891 Unspecified atrial fibrillation: Secondary | ICD-10-CM

## 2016-09-27 DIAGNOSIS — R338 Other retention of urine: Secondary | ICD-10-CM | POA: Diagnosis not present

## 2016-09-27 LAB — POCT INR: INR: 2.4

## 2016-09-27 MED ORDER — ENOXAPARIN SODIUM 80 MG/0.8ML ~~LOC~~ SOLN: SUBCUTANEOUS | 0 refills | Status: DC

## 2016-09-27 NOTE — Patient Instructions (Addendum)
Enoxaprin Dosing Schedule  Enoxparin dose: 80 mg  Date  Warfarin Dose (evenings) Enoxaprin Dose   6    4-11 5 2  tabs (5 mg)   4-12 4 0   4-13 3 0 8 am    8 pm  4-14 2 0 8 am    8 pm  4-15 1 0 8 am  4-16 Procedure 1 tab (2.5 mg)   4-17 1 3  tabs (7.5 mg) 8 am    8 pm  4-18 2 3  tabs (7.5 mg) 8 am    8 pm  4-19 3 2  tabs (5 mg) 8 am    8 pm  4-20 4 2  tabs (5 mg) 8 am    8 pm  4-21 5 2  tabs (5 mg) 8 am    8 pm  4-22 6 2  tabs (5 mg)    Repeat INR on April 23

## 2016-09-29 ENCOUNTER — Encounter (HOSPITAL_BASED_OUTPATIENT_CLINIC_OR_DEPARTMENT_OTHER): Payer: Self-pay | Admitting: Emergency Medicine

## 2016-09-29 ENCOUNTER — Emergency Department (HOSPITAL_BASED_OUTPATIENT_CLINIC_OR_DEPARTMENT_OTHER)
Admission: EM | Admit: 2016-09-29 | Discharge: 2016-09-29 | Disposition: A | Discharge status: Home / Self Care | Payer: Medicare HMO | Attending: Emergency Medicine | Admitting: Emergency Medicine

## 2016-09-29 DIAGNOSIS — R339 Retention of urine, unspecified: Secondary | ICD-10-CM

## 2016-09-29 DIAGNOSIS — K6289 Other specified diseases of anus and rectum: Secondary | ICD-10-CM | POA: Diagnosis present

## 2016-09-29 DIAGNOSIS — K5641 Fecal impaction: Secondary | ICD-10-CM

## 2016-09-29 DIAGNOSIS — Z7901 Long term (current) use of anticoagulants: Secondary | ICD-10-CM | POA: Insufficient documentation

## 2016-09-29 DIAGNOSIS — I10 Essential (primary) hypertension: Secondary | ICD-10-CM | POA: Insufficient documentation

## 2016-09-29 DIAGNOSIS — I2510 Atherosclerotic heart disease of native coronary artery without angina pectoris: Secondary | ICD-10-CM | POA: Insufficient documentation

## 2016-09-29 DIAGNOSIS — K5903 Drug induced constipation: Secondary | ICD-10-CM | POA: Diagnosis not present

## 2016-09-29 LAB — CBC WITH DIFFERENTIAL/PLATELET
Basophils Absolute: 0 10*3/uL (ref 0.0–0.1)
Basophils Relative: 0 %
EOS ABS: 0 10*3/uL (ref 0.0–0.7)
Eosinophils Relative: 0 %
HEMATOCRIT: 38.8 % — AB (ref 39.0–52.0)
HEMOGLOBIN: 13.4 g/dL (ref 13.0–17.0)
LYMPHS ABS: 0.6 10*3/uL — AB (ref 0.7–4.0)
Lymphocytes Relative: 6 %
MCH: 29.6 pg (ref 26.0–34.0)
MCHC: 34.5 g/dL (ref 30.0–36.0)
MCV: 85.8 fL (ref 78.0–100.0)
MONOS PCT: 12 %
Monocytes Absolute: 1.2 10*3/uL — ABNORMAL HIGH (ref 0.1–1.0)
NEUTROS ABS: 8.1 10*3/uL — AB (ref 1.7–7.7)
NEUTROS PCT: 82 %
Platelets: 174 10*3/uL (ref 150–400)
RBC: 4.52 MIL/uL (ref 4.22–5.81)
RDW: 16.5 % — ABNORMAL HIGH (ref 11.5–15.5)
WBC: 10 10*3/uL (ref 4.0–10.5)

## 2016-09-29 LAB — BASIC METABOLIC PANEL
Anion gap: 8 (ref 5–15)
BUN: 10 mg/dL (ref 6–20)
CHLORIDE: 98 mmol/L — AB (ref 101–111)
CO2: 25 mmol/L (ref 22–32)
CREATININE: 0.8 mg/dL (ref 0.61–1.24)
Calcium: 8.4 mg/dL — ABNORMAL LOW (ref 8.9–10.3)
GFR calc Af Amer: >60 mL/min (ref >60)
GFR calc non Af Amer: >60 mL/min (ref >60)
GLUCOSE: 128 mg/dL — AB (ref 65–99)
Potassium: 3.7 mmol/L (ref 3.5–5.1)
Sodium: 131 mmol/L — ABNORMAL LOW (ref 135–145)

## 2016-09-29 LAB — URINALYSIS, ROUTINE W REFLEX MICROSCOPIC
BILIRUBIN URINE: NEGATIVE
Glucose, UA: NEGATIVE mg/dL
Hgb urine dipstick: NEGATIVE
Ketones, ur: 15 mg/dL — AB
Leukocytes, UA: NEGATIVE
NITRITE: NEGATIVE
PH: 5.5 (ref 5.0–8.0)
Protein, ur: NEGATIVE mg/dL
SPECIFIC GRAVITY, URINE: 1.017 (ref 1.005–1.030)

## 2016-09-29 MED ORDER — SENNA-DOCUSATE SODIUM 8.6-50 MG PO TABS: 1.0000 | ORAL_TABLET | Freq: Two times a day (BID) | ORAL | 0 refills | Status: DC

## 2016-09-29 MED ORDER — LIDOCAINE HCL 2 % EX GEL: 1.0000 "application " | Freq: Once | CUTANEOUS | Status: DC

## 2016-09-29 MED ORDER — LIDOCAINE HCL 2 % EX GEL
CUTANEOUS | Status: AC
  Filled 2016-09-29: qty 20

## 2016-09-29 MED ORDER — MAGNESIUM CITRATE PO SOLN: 1.0000 | Freq: Once | ORAL | 0 refills | Status: AC

## 2016-09-29 MED ORDER — SODIUM CHLORIDE 0.9 % IV BOLUS (SEPSIS)
1000.0000 mL | Freq: Once | INTRAVENOUS | Status: AC
  Administered 2016-09-29: 1000 mL via INTRAVENOUS

## 2016-09-29 MED FILL — SM MAGNESIUM CITRATE SOLN: 1 days supply | Qty: 296 | Fill #0

## 2016-09-29 MED FILL — SENEXON-S TABLET: 8.6-50 | 50 days supply | Qty: 100 | Fill #0

## 2016-09-29 NOTE — Discharge Instructions (Addendum)
For severe constipation, you may place miralax 4 caps into one 32oz gatorade bottle, if it continues, use 3 caps the next day, then 2 caps, then 1 cap a day until you have daily soft stools.  If you do not have relief with initial miralax 4 caps, you may try magnesium citrate prescription, and if you do not have relief with that you may use over the counter enemas.  If you stop passing flatus, have vomiting, abdominal pain, please seek medical care.

## 2016-09-29 NOTE — ED Provider Notes (Signed)
[facility] Provider Note   CSN: [id number] Arrival date & time: 09/29/16  0720     History   Chief Complaint Chief Complaint  Patient presents with  . Rectal Pain    HPI Wayne [name] is a 83 y.o. male.  HPI   83 y.o. male with a PMH of CAD, hypertension, prior MI, atrial fibrillation on chronic Coumadin, recent sciatica seen by orthopedics, urinary retention weeks ago with foley removal Tuesday, presents with 11 days of constipation and urinary retention starting last night.  Reports severe rectal pain.Self disimpacting at home, tried stool softener, natural laxative over the counter. 2 teaspoons of epson salt, 8oz of water.  Had enema but hesitated to give it, fleets enema.   No abdominal pain.No nausea/vomiting. Passing flatus. Eating marginally ok.  Taking percocet every 4 hours for last few weeks.   Taking alfuzolin now for bladder.   Was seen for urinary retention weeks ago, had foley taken out on Tuesday, was urinating ok until last night.      Past Medical History:  Diagnosis Date  . Arthritis   . Atrial fibrillation (HCC)    Chronic  . BPH (benign prostatic hyperplasia)   . CAD (coronary artery disease)   . Chronic anticoagulation   . History of chicken pox   . HTN (hypertension)   . Old MI (myocardial infarction) 2007   s/p PCI to distal OM (no stent)  . 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 Hypokalemia 09/13/2016  . BPH with obstruction/lower urinary tract symptoms 09/13/2016  . Urinary frequency 07/10/2016  . Left bundle branch block (LBBB) on electrocardiogram 06/30/2016  . History of stroke 06/30/2016  . Generalized weakness 06/30/2016  . Osteoarthritis 04/04/2016  . Encounter for therapeutic drug monitoring 08/06/2013  . Chronic anticoagulation   . HTN (hypertension)   . CAD (coronary artery disease)   . Chronic atrial fibrillation ([place]) 09/13/2010  . Unspecified cerebral artery  occlusion with cerebral infarction 09/13/2010    Past Surgical History:  Procedure Laterality Date  . CORONARY ANGIOPLASTY  2007   Distal OM  . PROSTATE BIOPSY         Home Medications    Prior to Admission medications   Medication Sig Start Date End Date Taking? Authorizing Provider  alfuzosin (UROXATRAL) 10 MG 24 hr tablet Take 10 mg by mouth daily with breakfast.    Historical Provider, MD  Cholecalciferol (VITAMIN D3) 2000 units TABS Take 2,000 Units by mouth daily.    Historical Provider, MD  Cyanocobalamin (VITAMIN B 12 PO) Take 1 tablet by mouth daily.    Historical Provider, MD  digoxin (LANOXIN) 0.125 MG tablet Take 1 tablet (125 mcg total) by mouth daily. 03/09/16   Lelon Perla, MD  enoxaparin (LOVENOX) 80 MG/0.8ML injection Inject 1 syringe (80 mg) every 12 hours as directed 09/27/16   Lelon Perla, MD  magnesium citrate SOLN Take 296 mLs (1 Bottle total) by mouth once. 09/29/16 09/29/16  Gareth Morgan, MD  metoprolol tartrate (LOPRESSOR) 25 MG tablet Take 0.5-1 tablets (12.5-25 mg total) by mouth See admin instructions. Take 1 tablet (25 mg) by mouth every morning and 1/2 tablet (12.5 mg) at night 07/04/16   Venetia Maxon Rama, MD  oxycodone-acetaminophen (PERCOCET) 2.5-325 MG tablet Take 1 tablet by mouth every 4 (four) hours as needed for pain.    Historical Provider, MD  sennosides-docusate sodium (SENOKOT-S) 8.6-50 MG tablet Take 1  tablet by mouth 2 (two) times daily. 09/29/16   Gareth Morgan, MD  triamcinolone (KENALOG) 0.1 % cream Apply 1 application topically 2 (two) times daily as needed (itching). As directed by P.A. Lennie Odor with Dr. Allyson Sabal     Historical Provider, MD  warfarin (COUMADIN) 2.5 MG tablet Take 1-2 tablets (2.5-5 mg total) by mouth See admin instructions. Take 1 tablet (2.5 mg) by mouth on Tuesday and Saturday nights, take 2 tablets (5 mg) on Sunday, Monday, Wednesday, Thursday, Friday nights or as directed by coumadin clinic 07/04/16   Venetia Maxon Rama, MD    Family History Family History  Problem Relation Age of Onset  . Heart disease Mother   . Heart attack Mother   . Heart disease Father   . Heart attack Father   . Heart attack Brother   . Heart attack Sister   . Stroke Brother     Social History Social History  Substance Use Topics  . Smoking status: Never Smoker  . Smokeless tobacco: Never Used  . Alcohol use No     Allergies   Prednisone; Procaine hcl; Tylenol [acetaminophen]; and Penicillins   Review of Systems Review of Systems  Constitutional: Negative for fever.  HENT: Negative for sore throat.   Eyes: Negative for visual disturbance.  Respiratory: Negative for shortness of breath.   Cardiovascular: Negative for chest pain.  Gastrointestinal: Positive for constipation and rectal pain. Negative for abdominal pain, diarrhea, nausea and vomiting.  Genitourinary: Positive for difficulty urinating.  Musculoskeletal: Positive for back pain (sciatica). Negative for neck stiffness.  Skin: Negative for rash.  Neurological: Negative for syncope and headaches.     Physical Exam Updated Vital Signs BP 92/62   Pulse 70   Temp 98.2 F (36.8 C) (Oral)   Resp 15   Ht 5\' 10"  (1.778 m)   Wt 185 lb (83.9 kg)   SpO2 94%   BMI 26.54 kg/m   Physical Exam  Constitutional: He is oriented to person, place, and time. He appears well-developed and well-nourished. No distress.  HENT:  Head: Normocephalic and atraumatic.  Eyes: Conjunctivae and EOM are normal.  Neck: Normal range of motion.  Cardiovascular: Normal rate and intact distal pulses.  An irregularly irregular rhythm present. Exam reveals no gallop and no friction rub.   Murmur heard. Pulmonary/Chest: Effort normal and breath sounds normal. No respiratory distress. He has no wheezes. He has no rales.  Abdominal: Soft. He exhibits no distension. There is no tenderness. There is no guarding.  Suprapubic fullness  Genitourinary:  Genitourinary  Comments: Normal rectal tone. Brown stool present rectal vault   Musculoskeletal: He exhibits no edema.  Neurological: He is alert and oriented to person, place, and time.  Skin: Skin is warm and dry. He is not diaphoretic.  Nursing note and vitals reviewed.    ED Treatments / Results  Labs (all labs ordered are listed, but only abnormal results are displayed) Labs Reviewed  URINALYSIS, ROUTINE W REFLEX MICROSCOPIC - Abnormal; Notable for the following:       Result Value   Ketones, ur 15 (*)    All other components within normal limits  CBC WITH DIFFERENTIAL/PLATELET - Abnormal; Notable for the following:    HCT 38.8 (*)    RDW 16.5 (*)    Neutro Abs 8.1 (*)    Lymphs Abs 0.6 (*)    Monocytes Absolute 1.2 (*)    All other components within normal limits  BASIC METABOLIC PANEL - Abnormal;  Notable for the following:    Sodium 131 (*)    Chloride 98 (*)    Glucose, Bld 128 (*)    Calcium 8.4 (*)    All other components within normal limits    EKG  EKG Interpretation None       Radiology No results found.  Procedures Fecal disimpaction Date/Time: 09/29/2016 8:15 AM Performed by: Gareth Morgan Authorized by: Gareth Morgan  Consent: Verbal consent obtained. Consent given by: patient Patient understanding: patient states understanding of the procedure being performed Required items: required blood products, implants, devices, and special equipment available Patient identity confirmed: verbally with patient Time out: Immediately prior to procedure a "time out" was called to verify the correct patient, procedure, equipment, support staff and site/side marked as required. Preparation: Patient was prepped and draped in the usual sterile fashion. Local anesthesia used: yes  Anesthesia: Local anesthesia used: yes Local Anesthetic: topical anesthetic Anesthetic total: 10 mL  Sedation: Patient sedated: no Patient tolerance: Patient tolerated the procedure well  with no immediate complications    (including critical care time)  Medications Ordered in ED Medications  lidocaine (XYLOCAINE) 2 % jelly 1 application (not administered)  sodium chloride 0.9 % bolus 1,000 mL (1,000 mLs Intravenous New Bag/Given 09/29/16 0948)     Initial Impression / Assessment and Plan / ED Course  I have reviewed the triage vital signs and the nursing notes.  Pertinent labs & imaging results that were available during my care of the patient were reviewed by me and considered in my medical decision making (see chart for details).     83 y.o. male with a PMH of CAD, hypertension, prior MI, atrial fibrillation on chronic Coumadin, recent sciatica seen by orthopedics, urinary retention weeks ago with foley removal Tuesday, presents with 11 days of constipation and urinary retention starting last night.  Constipation likely secondary to opiate use. Patient denies any abdominal pain, has no nausea vomiting, is tolerating by mouth, is passing flatus, doubt acute obstruction. Patient has rectal pain, likely due to stool impaction. Disimpacted patient at bedside. Urinary retention likely secondary to severe constipation. Have low suspicion for cauda equina or other spinal compression given no numbness, weakness, no stool incontinence, normal rectal tone.   Labs obtained show no significant abnormalities.  Foley placed given continued urinary retention, over 600cc in bladder.  Recommend Urology follow up.    Patient took home medications while in the ED, however did not have anything to eat or drink. Following this, his blood pressures decreased to 01K systolic.  Do not have signs of sepsis, bleeding, cardiac etiology nor ruptured AAA.  Given IV fluids with improvement. Suspect these pressures likely secondary to dehydration, taking home medications.  Discussed return precautions and follow up plan.  Patient feels improved, eating drinking in ED. Patient discharged in stable condition  with understanding of reasons to return.     Final Clinical Impressions(s) / ED Diagnoses   Final diagnoses:  Drug-induced constipation  Fecal impaction (HCC)  Urinary retention    New Prescriptions New Prescriptions   MAGNESIUM CITRATE SOLN    Take 296 mLs (1 Bottle total) by mouth once.   SENNOSIDES-DOCUSATE SODIUM (SENOKOT-S) 8.6-50 MG TABLET    Take 1 tablet by mouth 2 (two) times daily.     Gareth Morgan, MD 09/29/16 1730

## 2016-09-29 NOTE — ED Notes (Signed)
Patient with urinary retention  - bladder scanner shows 650 Mls in the bladder. Patient reports that he feels like he could urinate. Patient assisted to standing position to urinated. 2 person maximum assistance to stand the patient. Patient did not want staff to stand by. Patient sat on the side of the bed and attempting to urinate on own.

## 2016-09-29 NOTE — ED Notes (Signed)
MD at bedside. MD performed disimpaction, with this RN to assist

## 2016-09-29 NOTE — ED Notes (Signed)
Patient unable to urinate on own. MD aware, order given to insert foley catheter

## 2016-09-29 NOTE — ED Notes (Signed)
MD aware of BP - orders to follow and completed

## 2016-09-29 NOTE — ED Triage Notes (Signed)
Patient reports that he has not had a BM in 11 days. The patient reports that his wife has been pulling some stool out of him. OTC methods have not worked

## 2016-10-02 ENCOUNTER — Ambulatory Visit
Admission: RE | Admit: 2016-10-02 | Discharge: 2016-10-02 | Disposition: A | Discharge status: Home / Self Care | Payer: Medicare HMO | Source: Ambulatory Visit | Attending: Orthopedic Surgery | Admitting: Orthopedic Surgery

## 2016-10-02 ENCOUNTER — Ambulatory Visit (INDEPENDENT_AMBULATORY_CARE_PROVIDER_SITE_OTHER): Payer: Medicare HMO | Admitting: Pharmacist Clinician (PhC)/ Clinical Pharmacy Specialist

## 2016-10-02 DIAGNOSIS — I6350 Cerebral infarction due to unspecified occlusion or stenosis of unspecified cerebral artery: Secondary | ICD-10-CM

## 2016-10-02 DIAGNOSIS — M48061 Spinal stenosis, lumbar region without neurogenic claudication: Secondary | ICD-10-CM

## 2016-10-02 DIAGNOSIS — I482 Chronic atrial fibrillation, unspecified: Secondary | ICD-10-CM

## 2016-10-02 DIAGNOSIS — I4891 Unspecified atrial fibrillation: Secondary | ICD-10-CM

## 2016-10-02 DIAGNOSIS — Z5181 Encounter for therapeutic drug level monitoring: Secondary | ICD-10-CM

## 2016-10-02 LAB — POCT INR: INR: 1.3

## 2016-10-02 MED ORDER — IOPAMIDOL (ISOVUE-M 200) INJECTION 41%
15.0000 mL | Freq: Once | INTRAMUSCULAR | Status: AC
  Administered 2016-10-02: 15 mL via INTRATHECAL

## 2016-10-02 NOTE — Progress Notes (Signed)
Patient states he has been off Warfarin for at least the past four days.  INR this morning is 1.3.  Brita Romp, RN

## 2016-10-02 NOTE — Discharge Instructions (Signed)

## 2016-10-03 ENCOUNTER — Encounter (HOSPITAL_BASED_OUTPATIENT_CLINIC_OR_DEPARTMENT_OTHER): Payer: Self-pay | Admitting: Emergency Medicine

## 2016-10-03 DIAGNOSIS — I2510 Atherosclerotic heart disease of native coronary artery without angina pectoris: Secondary | ICD-10-CM | POA: Insufficient documentation

## 2016-10-03 DIAGNOSIS — Z7901 Long term (current) use of anticoagulants: Secondary | ICD-10-CM | POA: Diagnosis not present

## 2016-10-03 DIAGNOSIS — T83091A Other mechanical complication of indwelling urethral catheter, initial encounter: Secondary | ICD-10-CM | POA: Insufficient documentation

## 2016-10-03 DIAGNOSIS — T83098A Other mechanical complication of other indwelling urethral catheter, initial encounter: Secondary | ICD-10-CM | POA: Diagnosis not present

## 2016-10-03 DIAGNOSIS — R339 Retention of urine, unspecified: Secondary | ICD-10-CM | POA: Diagnosis present

## 2016-10-03 DIAGNOSIS — I10 Essential (primary) hypertension: Secondary | ICD-10-CM | POA: Insufficient documentation

## 2016-10-03 DIAGNOSIS — Z79899 Other long term (current) drug therapy: Secondary | ICD-10-CM | POA: Insufficient documentation

## 2016-10-03 DIAGNOSIS — Y829 Unspecified medical devices associated with adverse incidents: Secondary | ICD-10-CM | POA: Insufficient documentation

## 2016-10-03 NOTE — ED Triage Notes (Signed)
Flushed patients catheter and repositioned. Patient states that he feels more comfortable. Draining well. Catheter bag emptied and will continue to monitor the drainage

## 2016-10-03 NOTE — ED Triage Notes (Signed)
Patient reports that he had an xray procedure yesterday and he has been flat on his back for the last 12 hours. Reports that he has had decreased urine, and now is leaking around the site

## 2016-10-04 ENCOUNTER — Emergency Department (HOSPITAL_BASED_OUTPATIENT_CLINIC_OR_DEPARTMENT_OTHER)
Admission: EM | Admit: 2016-10-04 | Discharge: 2016-10-04 | Disposition: A | Discharge status: Home / Self Care | Payer: Medicare HMO | Attending: Emergency Medicine | Admitting: Emergency Medicine

## 2016-10-04 DIAGNOSIS — I10 Essential (primary) hypertension: Secondary | ICD-10-CM | POA: Diagnosis not present

## 2016-10-04 DIAGNOSIS — Z7901 Long term (current) use of anticoagulants: Secondary | ICD-10-CM | POA: Diagnosis not present

## 2016-10-04 DIAGNOSIS — T83091A Other mechanical complication of indwelling urethral catheter, initial encounter: Secondary | ICD-10-CM

## 2016-10-04 DIAGNOSIS — I2510 Atherosclerotic heart disease of native coronary artery without angina pectoris: Secondary | ICD-10-CM | POA: Diagnosis not present

## 2016-10-04 DIAGNOSIS — Z79899 Other long term (current) drug therapy: Secondary | ICD-10-CM | POA: Diagnosis not present

## 2016-10-04 DIAGNOSIS — Y829 Unspecified medical devices associated with adverse incidents: Secondary | ICD-10-CM | POA: Diagnosis not present

## 2016-10-04 LAB — URINALYSIS, MICROSCOPIC (REFLEX)

## 2016-10-04 LAB — URINALYSIS, ROUTINE W REFLEX MICROSCOPIC
BILIRUBIN URINE: NEGATIVE
Glucose, UA: NEGATIVE mg/dL
KETONES UR: NEGATIVE mg/dL
Nitrite: NEGATIVE
PH: 5.5 (ref 5.0–8.0)
Protein, ur: 100 mg/dL — AB
Specific Gravity, Urine: 1.014 (ref 1.005–1.030)

## 2016-10-04 NOTE — ED Provider Notes (Signed)
TIME SEEN: 1:19 AM  CHIEF COMPLAINT: Leaking Foley catheter  HPI: Patient is an 83 year old male with history of chronic atrial fibrillation, CVA, hypertension, BPH with recent urinary retention that required Foley catheter placement approximate 2 weeks ago who just had his Foley catheter changed 4 days ago who presents emergency department with leaking around the Foley catheter that started today. He denies fevers, chills, vomiting or diarrhea. Reports he has had constipation and was recently seen and had a disimpaction. He states he has been taking MiraLAX intermittently with good relief and is curious if it is safe to take this medication regularly. He is passing gas. Denies abdominal pain. Has appointment with urology in one day.  ROS: See HPI Constitutional: no fever  Eyes: no drainage  ENT: no runny nose   Cardiovascular:  no chest pain  Resp: no SOB  GI: no vomiting GU: no dysuria Integumentary: no rash  Allergy: no hives  Musculoskeletal: no leg swelling  Neurological: no slurred speech ROS otherwise negative  PAST MEDICAL HISTORY/PAST SURGICAL HISTORY:  Past Medical History:  Diagnosis Date  . Arthritis   . Atrial fibrillation (HCC)    Chronic  . BPH (benign prostatic hyperplasia)   . CAD (coronary artery disease)   . Chronic anticoagulation   . History of chicken pox   . HTN (hypertension)   . Old MI (myocardial infarction) 2007   s/p PCI to distal OM (no stent)  . 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 Authorizing Provider  alfuzosin (UROXATRAL) 10 MG 24 hr tablet Take 10 mg by mouth daily with breakfast.    Historical Provider, MD  Cholecalciferol (VITAMIN D3) 2000 units TABS Take 2,000 Units by mouth daily.    Historical Provider, MD  Cyanocobalamin (VITAMIN B 12 PO) Take 1 tablet by mouth daily.    Historical Provider, MD  digoxin (LANOXIN) 0.125 MG tablet Take 1 tablet (125 mcg total)  by mouth daily. 03/09/16   Lelon Perla, MD  enoxaparin (LOVENOX) 80 MG/0.8ML injection Inject 1 syringe (80 mg) every 12 hours as directed 09/27/16   Lelon Perla, MD  metoprolol tartrate (LOPRESSOR) 25 MG tablet Take 0.5-1 tablets (12.5-25 mg total) by mouth See admin instructions. Take 1 tablet (25 mg) by mouth every morning and 1/2 tablet (12.5 mg) at night 07/04/16   Venetia Maxon Rama, MD  oxycodone-acetaminophen (PERCOCET) 2.5-325 MG tablet Take 1 tablet by mouth every 4 (four) hours as needed for pain.    Historical Provider, MD  sennosides-docusate sodium (SENOKOT-S) 8.6-50 MG tablet Take 1 tablet by mouth 2 (two) times daily. 09/29/16   Gareth Morgan, MD  triamcinolone (KENALOG) 0.1 % cream Apply 1 application topically 2 (two) times daily as needed (itching). As directed by P.A. Lennie Odor with Dr. Allyson Sabal     Historical Provider, MD  warfarin (COUMADIN) 2.5 MG tablet Take 1-2 tablets (2.5-5 mg total) by mouth See admin instructions. Take 1 tablet (2.5 mg) by mouth on Tuesday and Saturday nights, take 2 tablets (5 mg) on Sunday, Monday, Wednesday, Thursday, Friday nights or as directed by coumadin clinic 07/04/16   Venetia Maxon Rama, MD    ALLERGIES:  Allergies  Allergen Reactions  . Prednisone Other (See Comments)    Wheezing  . Procaine Hcl Other (See Comments)    Passed out after being given this at dental appointment  . Penicillins Rash    Has patient had  a PCN reaction causing immediate rash, facial/tongue/throat swelling, SOB or lightheadedness with hypotension: Yes Has patient had a PCN reaction causing severe rash involving mucus membranes or skin necrosis: No Has patient had a PCN reaction that required hospitalization No Has patient had a PCN reaction occurring within the last 10 years: No If all of the above answers are "NO", then may proceed with Cephalosporin use.  . Tylenol [Acetaminophen] Other (See Comments)    Makes him very sleepy if he takes more than one  tablet    SOCIAL HISTORY:  Social History  Substance Use Topics  . Smoking status: Never Smoker  . Smokeless tobacco: Never Used  . Alcohol use No    FAMILY HISTORY: Family History  Problem Relation Age of Onset  . Heart disease Mother   . Heart attack Mother   . Heart disease Father   . Heart attack Father   . Heart attack Brother   . Heart attack Sister   . Stroke Brother     EXAM: BP (!) 155/80 (BP Location: Right Arm)   Pulse 92   Temp 98.4 F (36.9 C) (Oral)   Resp 18   Ht 5\' 10"  (1.778 m)   Wt 185 lb (83.9 kg)   SpO2 100%   BMI 26.54 kg/m  CONSTITUTIONAL: Alert and oriented and responds appropriately to questions. Well-appearing; well-nourished, Elderly, in no distress HEAD: Normocephalic EYES: Conjunctivae clear, pupils appear equal, EOMI ENT: normal nose; moist mucous membranes NECK: Supple, no meningismus, no nuchal rigidity, no LAD  CARD: RRR; S1 and S2 appreciated; no murmurs, no clicks, no rubs, no gallops RESP: Normal chest excursion without splinting or tachypnea; breath sounds clear and equal bilaterally; no wheezes, no rhonchi, no rales, no hypoxia or respiratory distress, speaking full sentences ABD/GI: Normal bowel sounds; non-distended; soft, non-tender, no rebound, no guarding, no peritoneal signs, no hepatosplenomegaly GU:  Normal external genitalia, circumcised male, normal penile shaft, no blood or discharge at the urethral meatus, no testicular masses or tenderness on exam, no scrotal masses or swelling, no hernias appreciated, 2+ femoral pulses bilaterally; no perineal erythema, warmth, subcutaneous air or crepitus; no high riding testicle, normal bilateral cremasteric reflex.  Chaperone present for exam.  Indwelling Foley catheter in place without any surrounding discharge, bleeding and no leaking of urine. BACK:  The back appears normal and is non-tender to palpation, there is no CVA tenderness EXT: Normal ROM in all joints; non-tender to  palpation; no edema; normal capillary refill; no cyanosis, no calf tenderness or swelling    SKIN: Normal color for age and race; warm; no rash NEURO: Moves all extremities equally PSYCH: The patient's mood and manner are appropriate. Grooming and personal hygiene are appropriate.  MEDICAL DECISION MAKING: Patient here with leaking around his Foley catheter. Catheter was manipulated and flushed and is now draining appropriately. Urinalysis appears to be colonized. We'll send a urine culture by this time I do not feel he needs to be started on antibiotics. He reports feeling better. He has a follow-up with his urologist this week.  He has had chronic constipation as he states he has a "slow gut". Discussed with him that MiraLAX is safe to take regularly and also recommended Colace. Recommend increased water and fiber intake at home. Abdominal exam benign. No distention, fever, vomiting. Doubt obstruction. Do not feel he needs emergent imaging of his abdomen. He reports he has been having bowel movements at home.    At this time, I do not feel there  is any life-threatening condition present. I have reviewed and discussed all results (EKG, imaging, lab, urine as appropriate) and exam findings with patient/family. I have reviewed nursing notes and appropriate previous records.  I feel the patient is safe to be discharged home without further emergent workup and can continue workup as an outpatient as needed. Discussed usual and customary return precautions. Patient/family verbalize understanding and are comfortable with this plan.  Outpatient follow-up has been provided if needed. All questions have been answered.      [place], DO 10/04/16 [phone]

## 2016-10-04 NOTE — ED Notes (Signed)
ED Provider at bedside. 

## 2016-10-04 NOTE — Discharge Instructions (Signed)
I recommend that you increase your water and fiber intake. If you are not able to eat foods high in fiber, you may use Benefiber or Metamucil over-the-counter. I also recommend you use MiraLAX 1-2 times a day and Colace 100 mg twice a day to help with bowel movements. These medications are over the counter.  You may use other over-the-counter medications such as Dulcolax, Fleet enemas, magnesium citrate as needed for constipation. Please note that some of these medications may cause you to have abdominal cramping which is normal. If you develop severe abdominal pain, fever, vomiting, distention of your abdomen, unable to have a bowel movement for 5 days or are not passing gas, please return to the hospital. ° °

## 2016-10-05 DIAGNOSIS — N3000 Acute cystitis without hematuria: Secondary | ICD-10-CM | POA: Diagnosis not present

## 2016-10-05 DIAGNOSIS — R338 Other retention of urine: Secondary | ICD-10-CM | POA: Diagnosis not present

## 2016-10-05 DIAGNOSIS — N401 Enlarged prostate with lower urinary tract symptoms: Secondary | ICD-10-CM | POA: Diagnosis not present

## 2016-10-05 DIAGNOSIS — M5442 Lumbago with sciatica, left side: Secondary | ICD-10-CM | POA: Diagnosis not present

## 2016-10-05 DIAGNOSIS — M48061 Spinal stenosis, lumbar region without neurogenic claudication: Secondary | ICD-10-CM | POA: Diagnosis not present

## 2016-10-07 LAB — URINE CULTURE: Culture: >=100000 — AB

## 2016-10-08 ENCOUNTER — Telehealth: Payer: Self-pay

## 2016-10-08 NOTE — Telephone Encounter (Signed)
No treatment needed for UC 10/04/16 per Salome Arnt Pharm D

## 2016-10-09 ENCOUNTER — Ambulatory Visit (INDEPENDENT_AMBULATORY_CARE_PROVIDER_SITE_OTHER): Payer: Medicare HMO | Admitting: Pharmacist Clinician (PhC)/ Clinical Pharmacy Specialist

## 2016-10-09 DIAGNOSIS — I4891 Unspecified atrial fibrillation: Secondary | ICD-10-CM | POA: Diagnosis not present

## 2016-10-09 DIAGNOSIS — Z5181 Encounter for therapeutic drug level monitoring: Secondary | ICD-10-CM

## 2016-10-09 DIAGNOSIS — I6350 Cerebral infarction due to unspecified occlusion or stenosis of unspecified cerebral artery: Secondary | ICD-10-CM | POA: Diagnosis not present

## 2016-10-09 DIAGNOSIS — I482 Chronic atrial fibrillation, unspecified: Secondary | ICD-10-CM

## 2016-10-09 LAB — POCT INR: INR: 2.4

## 2016-10-20 NOTE — Telephone Encounter (Signed)
Notified patient.

## 2016-10-20 NOTE — Telephone Encounter (Signed)
Please let patient know that he now has a zero balance from his visit with [name] on 07/10/16. The incorrect $40.00 specialist copay charge has been corrected.

## 2016-10-31 ENCOUNTER — Ambulatory Visit (INDEPENDENT_AMBULATORY_CARE_PROVIDER_SITE_OTHER): Payer: Medicare HMO | Admitting: Pharmacist

## 2016-10-31 DIAGNOSIS — I482 Chronic atrial fibrillation, unspecified: Secondary | ICD-10-CM

## 2016-10-31 DIAGNOSIS — Z5181 Encounter for therapeutic drug level monitoring: Secondary | ICD-10-CM | POA: Diagnosis not present

## 2016-10-31 LAB — POCT INR: INR: 2

## 2016-11-01 DIAGNOSIS — R350 Frequency of micturition: Secondary | ICD-10-CM | POA: Diagnosis not present

## 2016-11-01 DIAGNOSIS — N390 Urinary tract infection, site not specified: Secondary | ICD-10-CM | POA: Diagnosis not present

## 2016-11-01 DIAGNOSIS — R3915 Urgency of urination: Secondary | ICD-10-CM | POA: Diagnosis not present

## 2016-11-01 DIAGNOSIS — R8271 Bacteriuria: Secondary | ICD-10-CM | POA: Diagnosis not present

## 2016-11-03 ENCOUNTER — Telehealth: Payer: Self-pay | Admitting: Cardiology

## 2016-11-03 NOTE — Telephone Encounter (Signed)
LMOM; called patient but no answer. Collie Siad to call aback at [phone] to discuss new therapy and potential interaction with coumadin

## 2016-11-03 NOTE — Telephone Encounter (Signed)
Started doxycycline 100mg  twice daily for 5 days on Wednesday evening.  (Patient refused to come to clinic today)  **HOLD warfarin dose today ONLY, then resume normal dose** Call back if antibiotics extended or changed on Monday when cultures resulted.

## 2016-11-03 NOTE — Telephone Encounter (Signed)
Patient wife calling, states that patient started a new medication and was instructed to make the coumadin clinic aware. Thanks.

## 2016-11-10 ENCOUNTER — Ambulatory Visit (INDEPENDENT_AMBULATORY_CARE_PROVIDER_SITE_OTHER): Payer: Medicare HMO | Admitting: Pharmacist Clinician (PhC)/ Clinical Pharmacy Specialist

## 2016-11-10 DIAGNOSIS — I482 Chronic atrial fibrillation, unspecified: Secondary | ICD-10-CM

## 2016-11-10 DIAGNOSIS — Z5181 Encounter for therapeutic drug level monitoring: Secondary | ICD-10-CM | POA: Diagnosis not present

## 2016-11-10 LAB — POCT INR: INR: 2.1

## 2016-11-16 ENCOUNTER — Ambulatory Visit (INDEPENDENT_AMBULATORY_CARE_PROVIDER_SITE_OTHER): Payer: Medicare HMO | Admitting: Family Medicine

## 2016-11-16 ENCOUNTER — Encounter: Payer: Self-pay | Admitting: Family Medicine

## 2016-11-16 VITALS — BP 110/64 | HR 64 | Ht 71.0 in | Wt 184.0 lb

## 2016-11-16 DIAGNOSIS — I482 Chronic atrial fibrillation, unspecified: Secondary | ICD-10-CM

## 2016-11-16 DIAGNOSIS — E559 Vitamin D deficiency, unspecified: Secondary | ICD-10-CM

## 2016-11-16 DIAGNOSIS — I614 Nontraumatic intracerebral hemorrhage in cerebellum: Secondary | ICD-10-CM | POA: Diagnosis not present

## 2016-11-16 DIAGNOSIS — K599 Functional intestinal disorder, unspecified: Secondary | ICD-10-CM | POA: Diagnosis not present

## 2016-11-16 DIAGNOSIS — K635 Polyp of colon: Secondary | ICD-10-CM | POA: Diagnosis not present

## 2016-11-16 DIAGNOSIS — Z8639 Personal history of other endocrine, nutritional and metabolic disease: Secondary | ICD-10-CM | POA: Diagnosis not present

## 2016-11-16 DIAGNOSIS — R350 Frequency of micturition: Secondary | ICD-10-CM | POA: Diagnosis not present

## 2016-11-16 NOTE — Patient Instructions (Signed)
Please see handout I printed out for you on healthy bowel habits.   You can take mirilax daily and drink plenty of water each day.

## 2016-11-16 NOTE — Assessment & Plan Note (Signed)
Will need to check levels next bld draw

## 2016-11-16 NOTE — Assessment & Plan Note (Signed)
Pt ASx today.   Managed by Cards including anticoagulation  Follows with them q 40mo  Gets BP/ anti-coag meds etc from them.

## 2016-11-16 NOTE — Progress Notes (Signed)
New patient office visit note:  Impression and Recommendations:    1. Colonic dysmotility   2. Benign colonic polyp- 25 yrs ago.    3. 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 Vitamin D deficiency   5. H/O non anemic vitamin B12 deficiency   6. Chronic atrial fibrillation (HCC)   7. Urinary frequency      Vitamin D deficiency Takes 5k IU daily  Will check at next bld draw   Mild Colonic dysmotility Explained symptoms likely due to inconsistencies with his bowel hygiene and inconsistencies with his use of over-the-counter medications.    Told to be consistent with fiber, water and miralax daily.     Handouts provided.    Bowel hygeine d/c pt and wife.   1/2 wt in ounces water per day d/c them   Chronic atrial fibrillation ([place]) Pt ASx today.   Managed by Cards including anticoagulation  Follows with them q 42mo  Gets BP/ anti-coag meds etc from them.    H/O non anemic vitamin B12 deficiency Will need to check levels next bld draw  Urinary frequency Patient is following with urology for this.    I recommend if he develops any new symptoms, follow up sooner with them than his planned office visit in June.  Red flag sx d/c pt and wife    The patient was counseled, risk factors were discussed, anticipatory guidance given. Gross side effects, risk and benefits, and alternatives of medications discussed with patient.  Patient is aware that all medications have potential side effects and we are unable to predict every side effect or drug-drug interaction that may occur.  Expresses verbal understanding and consents to current therapy plan and treatment regimen.  Return in about 6 months (around 05/18/2017) for f/up bowel habits, chronic conditions- sooner than planned if problems. Will obtain fasting labs at that time   Please see AVS handed out to patient at the end of our visit for further patient instructions/ counseling  done pertaining to today's office visit.    Note: This document was prepared using Dragon voice recognition software and may include unintentional dictation errors.  ----------------------------------------------------------------------------------------------------------------------    Subjective:    Chief complaint:   Chief Complaint  Patient presents with  . Establish Care     HPI: Wayne [name] is a pleasant 83 y.o. male who presents to [place] [facility] today to review their medical history with me and establish care.   I asked the patient to review their chronic problem list with me to ensure everything was updated and accurate.    All recent office visits with other providers, any medical records that patient brought in etc  - I reviewed today.     Also asked pt to get me medical records from [facility] providers/ specialists that they had seen within the past 3-5 years- if they are in private practice and/or do not work for a [employer], [facility], [place], [place] or [employer] owned practice.  Told them to call their specialists to clarify this if they are not sure.   -- Dr Elkin---> prior PCP for many yrs.     -- [facility]SO [facility] as [facility].   Sees urology and cards q 6 mo also.    1 time/day per week- he has several stools-- about 3-4 in a row and last stool is loose- he calls this his "cleaning out  day".    Eats a lot of fiber through out the Day and mirilax daily and stool softner.  Usually has two stools per day.  BUT, every so often he skips miralax and stool softner b/c he thinks bowels are working well--> and he has these rebound sx couple days later.  Wife told him to take this daily but pt doesn';t listen/   Patient with a history of chronic atrial fibrillation, history of cerebellar CVA and coronary artery disease status post stent placement.  He is on chronic anticoagulation and manage through cardiology.  He is completely  asymptomatic with these today.  He complains of some nighttime urination symptoms.  He is being treated for a urinary tract infection that he recently was in the ER for.  He is following with urology and being treated by them for this- on his 3rd set of ABX apparently/.    I do not have those notes for my review.   Asked patient to get them for me.  He denies any new urinary symptoms, fever, chills, abdominal pain, nausea, vomiting, or generalized ill feelings.    Wt Readings from Last 3 Encounters:  11/16/16 184 lb (83.5 kg)  10/03/16 185 lb (83.9 kg)  09/29/16 185 lb (83.9 kg)   BP Readings from Last 3 Encounters:  11/16/16 110/64  10/04/16 (!) 146/92  10/02/16 121/71   Pulse Readings from Last 3 Encounters:  11/16/16 64  10/04/16 73  10/02/16 75   BMI Readings from Last 3 Encounters:  11/16/16 25.66 kg/m  10/03/16 26.54 kg/m  09/29/16 26.54 kg/m    Patient Care Team    Relationship Specialty Notifications Start End  [name], DO PCP - General Family Medicine  10/23/16   Lelon Perla, MD Consulting Physician Cardiology  10/23/16   Jarome Matin, MD Consulting Physician Dermatology  10/23/16   Kathie Rhodes, MD Consulting Physician Urology  10/23/16   Latanya Maudlin, MD Consulting Physician Orthopedic Surgery  11/16/16    Comment: back pain    Patient Active Problem List   Diagnosis Date Noted  . Vitamin D deficiency 11/16/2016    Priority: High  . H/O non anemic vitamin B12 deficiency 11/16/2016    Priority: High  . Mild Colonic dysmotility 11/16/2016    Priority: High  . Chronic atrial fibrillation ([place]) 09/13/2010    Priority: High  . Chronic anticoagulation     Priority: Medium  . HTN (hypertension)     Priority: Medium  . CAD (coronary artery disease)     Priority: Medium  . h/o CVA (cerebrovascular accident due to intracerebral hemorrhage) 09/13/2010    Priority: Medium  . BPH with obstruction/lower urinary tract symptoms 09/13/2016    Priority:  Low  . Benign colonic polyp- 25 yrs ago.  11/16/2016  . Hypokalemia 09/13/2016  . Urinary frequency 07/10/2016  . Left bundle branch block (LBBB) on electrocardiogram 06/30/2016  . History of stroke 06/30/2016  . Generalized weakness 06/30/2016  . Osteoarthritis 04/04/2016  . Encounter for therapeutic drug monitoring 08/06/2013     Past Medical History:  Diagnosis Date  . Arthritis   . Atrial fibrillation (HCC)    Chronic  . BPH (benign prostatic hyperplasia)   . CAD (coronary artery disease)   . Chronic anticoagulation   . History of chicken pox   . HTN (hypertension)   . Old MI (myocardial infarction) 2007   s/p PCI to distal OM (no stent)  . 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 Arthritis   . Atrial fibrillation (HCC)    Chronic  . BPH (benign prostatic hyperplasia)   . CAD (coronary artery disease)   . Chronic anticoagulation   . History of chicken pox   . HTN (hypertension)   . Old MI (myocardial infarction) 2007   s/p PCI to distal OM (no stent)  . Stroke, embolic [facility])      Past Surgical History:  Procedure Laterality Date  . CORONARY ANGIOPLASTY  2007   Distal OM  . PROSTATE BIOPSY       Family History  Problem Relation Age of Onset  . Heart disease Mother   . Heart attack Mother   . Heart disease Father   . Heart attack Father   . Heart attack Brother   . Heart attack Sister   . Stroke Brother      History  Drug Use No     History  Alcohol Use No     History  Smoking Status  . Never Smoker  Smokeless Tobacco  . Never Used     Outpatient Encounter Prescriptions as of 11/16/2016  Medication Sig  . alfuzosin (UROXATRAL) 10 MG 24 hr tablet Take 10 mg by mouth daily with breakfast.  . Cholecalciferol (VITAMIN D3) 2000 units TABS Take 2,000 Units by mouth daily.  . Cyanocobalamin (VITAMIN B 12 PO) Take 1 tablet by mouth daily.  . digoxin (LANOXIN) 0.125 MG tablet Take 1 tablet (125 mcg total) by  mouth daily.  . metoprolol tartrate (LOPRESSOR) 25 MG tablet Take 0.5-1 tablets (12.5-25 mg total) by mouth See admin instructions. Take 1 tablet (25 mg) by mouth every morning and 1/2 tablet (12.5 mg) at night  . sennosides-docusate sodium (SENOKOT-S) 8.6-50 MG tablet Take 1 tablet by mouth 2 (two) times daily.  Marland Kitchen triamcinolone (KENALOG) 0.1 % cream Apply 1 application topically 2 (two) times daily as needed (itching). As directed by P.A. Lennie Odor with Dr. Allyson Sabal   . warfarin (COUMADIN) 2.5 MG tablet Take 1-2 tablets (2.5-5 mg total) by mouth See admin instructions. Take 1 tablet (2.5 mg) by mouth on Tuesday and Saturday nights, take 2 tablets (5 mg) on Sunday, Monday, Wednesday, Thursday, Friday nights or as directed by coumadin clinic  . [DISCONTINUED] oxycodone-acetaminophen (PERCOCET) 2.5-325 MG tablet Take 1 tablet by mouth every 4 (four) hours as needed for pain.   No facility-administered encounter medications on file as of 11/16/2016.     Allergies: Prednisone; Procaine hcl; Levofloxacin; Oxycodone; Penicillins; and Tylenol [acetaminophen]   Review of Systems  Constitutional: Negative for chills, diaphoresis, fever, malaise/fatigue and weight loss.  HENT: Negative for congestion, sore throat and tinnitus.   Eyes: Negative for blurred vision, double vision and photophobia.  Respiratory: Negative for cough and wheezing.   Cardiovascular: Negative for chest pain and palpitations.  Gastrointestinal: Negative for blood in stool, diarrhea, nausea and vomiting.  Genitourinary: Positive for frequency. Negative for dysuria and urgency.  Musculoskeletal: Negative for joint pain and myalgias.  Skin: Negative for itching and rash.  Neurological: Negative for dizziness, focal weakness, weakness and headaches.  Endo/Heme/Allergies: Negative for environmental allergies and polydipsia. Does not bruise/bleed easily.  Psychiatric/Behavioral: Negative for depression and memory loss. The patient  is not nervous/anxious and does not have insomnia.      Objective:   Blood pressure 110/64, pulse 64, height 5\' 11"  (1.803 m), weight 184 lb (83.5 kg), SpO2 97 %. Body mass index is 25.66 kg/m. General: Well  Developed, well nourished, and in no acute distress.  Neuro: Alert and oriented x3, extra-ocular muscles intact, sensation grossly intact.  HEENT:/AT, PERRLA, neck supple, No carotid bruits apprec Skin: no gross rashes  Cardiac: Regular rate and rhythm today in office, + blowing M 2/6 Respiratory: Essentially clear to auscultation bilaterally. Not using accessory muscles, speaking in full sentences.  Abdominal: not grossly distended, No G/R/R, + BS * 4 Musculoskeletal: Ambulates w/o diff, FROM * 4 ext.  Vasc: less 2 sec cap RF, warm and pink  Psych:  No HI/SI, judgement and insight good, Euthymic mood. Full Affect.

## 2016-11-16 NOTE — Assessment & Plan Note (Signed)
Patient is following with urology for this.    I recommend if he develops any new symptoms, follow up sooner with them than his planned office visit in June.  Red flag sx d/c pt and wife

## 2016-11-16 NOTE — Assessment & Plan Note (Addendum)
Takes 5k IU daily  Will check at next bld draw

## 2016-11-16 NOTE — Assessment & Plan Note (Addendum)
Explained symptoms likely due to inconsistencies with his bowel hygiene and inconsistencies with his use of over-the-counter medications.    Told to be consistent with fiber, water and miralax daily.     Handouts provided.    Bowel hygeine d/c pt and wife.   1/2 wt in ounces water per day d/c them

## 2016-11-27 ENCOUNTER — Telehealth: Payer: Self-pay | Admitting: Cardiology

## 2016-11-27 MED ORDER — WARFARIN SODIUM 2.5 MG PO TABS: ORAL_TABLET | ORAL | 0 refills | Status: DC

## 2016-11-27 NOTE — Telephone Encounter (Signed)
°*  STAT* If patient is at the pharmacy, call can be transferred to refill team.   1. Which medications need to be refilled? (please list name of each medication and dose if known) Warfarin 2.5 mg  2. Which pharmacy/location (including street and city if local pharmacy) is medication to be sent to? [place] ([place]), [place] - [address]  3. Do they need a 30 day or 90 day supply? "135 tablets"

## 2016-12-21 ENCOUNTER — Ambulatory Visit (INDEPENDENT_AMBULATORY_CARE_PROVIDER_SITE_OTHER): Payer: Medicare HMO | Admitting: Pharmacist

## 2016-12-21 DIAGNOSIS — Z5181 Encounter for therapeutic drug level monitoring: Secondary | ICD-10-CM | POA: Diagnosis not present

## 2016-12-21 DIAGNOSIS — I614 Nontraumatic intracerebral hemorrhage in cerebellum: Secondary | ICD-10-CM

## 2016-12-21 DIAGNOSIS — I482 Chronic atrial fibrillation, unspecified: Secondary | ICD-10-CM

## 2016-12-21 LAB — POCT INR: INR: 2.4

## 2017-01-02 DIAGNOSIS — R3914 Feeling of incomplete bladder emptying: Secondary | ICD-10-CM | POA: Diagnosis not present

## 2017-01-17 DIAGNOSIS — 419620001 Death: Secondary | SNOMED CT | POA: Diagnosis not present

## 2017-01-17 DEATH — deceased

## 2017-01-25 DIAGNOSIS — Z85828 Personal history of other malignant neoplasm of skin: Secondary | ICD-10-CM | POA: Diagnosis not present

## 2017-01-25 DIAGNOSIS — C44311 Basal cell carcinoma of skin of nose: Secondary | ICD-10-CM | POA: Diagnosis not present

## 2017-01-25 DIAGNOSIS — D485 Neoplasm of uncertain behavior of skin: Secondary | ICD-10-CM | POA: Diagnosis not present

## 2017-01-25 DIAGNOSIS — L570 Actinic keratosis: Secondary | ICD-10-CM | POA: Diagnosis not present

## 2017-01-25 DIAGNOSIS — L821 Other seborrheic keratosis: Secondary | ICD-10-CM | POA: Diagnosis not present

## 2017-01-30 NOTE — Progress Notes (Signed)
HPI: FU atrial fibrillation and CAD.Cardiac catheterization 2007 showed ejection fraction 50-55%, 100% mid to distal obtuse marginal. Patient had PCI of the OM at that time. 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 Echocardiogram August 2017 showed normal LV systolic function with akinesis of the mid apical inferior lateral myocardium. There was moderate aortic stenosis with mean gradient 30 mmHg. Biatrial enlargement with mild mitral regurgitation. Since last seen, patient denies dyspnea, chest pain, palpitations or syncope. No bleeding. He has noticed trace pedal edema.  Current Outpatient Prescriptions  Medication Sig Dispense Refill  . acetaminophen (TYLENOL) 500 MG tablet Take 500 mg by mouth every 6 (six) hours as needed.    Marland Kitchen alfuzosin (UROXATRAL) 10 MG 24 hr tablet Take 10 mg by mouth daily with breakfast.    . Cholecalciferol (VITAMIN D3) 2000 units TABS Take 2,000 Units by mouth daily.    . Cyanocobalamin (VITAMIN B 12 PO) Take 1 tablet by mouth daily.    . digoxin (LANOXIN) 0.125 MG tablet Take 1 tablet (125 mcg total) by mouth daily. 90 tablet 3  . metoprolol tartrate (LOPRESSOR) 25 MG tablet Take 0.5-1 tablets (12.5-25 mg total) by mouth See admin instructions. Take 1 tablet (25 mg) by mouth every morning and 1/2 tablet (12.5 mg) at night    . sennosides-docusate sodium (SENOKOT-S) 8.6-50 MG tablet Take 1 tablet by mouth 2 (two) times daily. 30 tablet 0  . triamcinolone (KENALOG) 0.1 % cream Apply 1 application topically 2 (two) times daily as needed (itching). As directed by P.A. Lennie Odor with Dr. Allyson Sabal     . warfarin (COUMADIN) 2.5 MG tablet Take 1 to 2 tablets daily as directed by coumadin Clinic. 135 tablet 0   No current facility-administered medications for this visit.      Past Medical History:  Diagnosis Date  . Arthritis   . Atrial fibrillation (HCC)    Chronic  . BPH (benign prostatic hyperplasia)   . CAD (coronary  artery disease)   . Chronic anticoagulation   . History of chicken pox   . HTN (hypertension)   . Old MI (myocardial infarction) 2007   s/p PCI to distal OM (no stent)  . Stroke, embolic [facility])     Past Surgical History:  Procedure Laterality Date  . CORONARY ANGIOPLASTY  2007   Distal OM  . PROSTATE BIOPSY      Social History   Social History  . Marital status: Married    Spouse name: N/A  . Number of children: 3  . Years of education: 9   Occupational History  . Not on file.   Social History Main Topics  . Smoking status: Never Smoker  . Smokeless tobacco: Never Used  . Alcohol use No  . Drug use: No  . Sexual activity: Not on file   Other Topics Concern  . Not on file   Social History Narrative   Fun: Garden, work in the yard, anything physical and walk daily    Family History  Problem Relation Age of Onset  . Heart disease Mother   . Heart attack Mother   . Heart disease Father   . Heart attack Father   . Heart attack Brother   . Heart attack Sister   . Stroke Brother     ROS: no fevers or chills, productive cough, hemoptysis, dysphasia, odynophagia, melena, hematochezia, dysuria, hematuria, rash, seizure activity, orthopnea, PND, claudication. Remaining systems are negative.  Physical Exam: Well-developed well-nourished in no acute distress.  Skin is warm and dry.  HEENT is normal.  Neck is supple.  Chest is clear to auscultation with normal expansion.  Cardiovascular exam is irregular, 3/6 systolic murmur  Abdominal exam nontender or distended. No masses palpated. Extremities show trace edema. neuro grossly intact  ECG- Atrial fibrillation, nonspecific ST changes. personally reviewed  A/P  1 Permanent atrial fibrillation-continue digoxin and metoprolol for rate control. Continue Coumadin. Not interested in NOACs.  2 moderate aortic stenosis-we will plan to repeat echocardiogram.  3 coronary artery disease-continue present therapy. No  aspirin given need for Coumadin. He declined statins.  4 hypertension-blood pressure is controlled. Continue present medications.  5 edema-he has developed very mild pedal edema. We will consider adding a diuretic in the future as needed.  Kirk Ruths, MD

## 2017-02-01 ENCOUNTER — Ambulatory Visit (INDEPENDENT_AMBULATORY_CARE_PROVIDER_SITE_OTHER): Payer: Medicare HMO | Admitting: Cardiology

## 2017-02-01 ENCOUNTER — Encounter: Payer: Self-pay | Admitting: Cardiology

## 2017-02-01 ENCOUNTER — Ambulatory Visit (INDEPENDENT_AMBULATORY_CARE_PROVIDER_SITE_OTHER): Payer: Medicare HMO | Admitting: Pharmacist

## 2017-02-01 VITALS — BP 114/62 | HR 61 | Ht 71.0 in | Wt 178.0 lb

## 2017-02-01 DIAGNOSIS — I482 Chronic atrial fibrillation, unspecified: Secondary | ICD-10-CM

## 2017-02-01 DIAGNOSIS — I350 Nonrheumatic aortic (valve) stenosis: Secondary | ICD-10-CM

## 2017-02-01 DIAGNOSIS — Z5181 Encounter for therapeutic drug level monitoring: Secondary | ICD-10-CM | POA: Diagnosis not present

## 2017-02-01 DIAGNOSIS — I614 Nontraumatic intracerebral hemorrhage in cerebellum: Secondary | ICD-10-CM

## 2017-02-01 DIAGNOSIS — I10 Essential (primary) hypertension: Secondary | ICD-10-CM

## 2017-02-01 LAB — POCT INR: INR: 2.6

## 2017-02-01 NOTE — Patient Instructions (Signed)

## 2017-02-12 ENCOUNTER — Ambulatory Visit (HOSPITAL_COMMUNITY): Payer: Medicare HMO | Attending: Cardiology

## 2017-02-12 ENCOUNTER — Other Ambulatory Visit: Payer: Self-pay

## 2017-02-12 DIAGNOSIS — I340 Nonrheumatic mitral (valve) insufficiency: Secondary | ICD-10-CM | POA: Diagnosis not present

## 2017-02-12 DIAGNOSIS — I272 Pulmonary hypertension, unspecified: Secondary | ICD-10-CM | POA: Insufficient documentation

## 2017-02-12 DIAGNOSIS — I2510 Atherosclerotic heart disease of native coronary artery without angina pectoris: Secondary | ICD-10-CM | POA: Diagnosis not present

## 2017-02-12 DIAGNOSIS — I10 Essential (primary) hypertension: Secondary | ICD-10-CM | POA: Diagnosis not present

## 2017-02-12 DIAGNOSIS — I350 Nonrheumatic aortic (valve) stenosis: Secondary | ICD-10-CM

## 2017-02-12 DIAGNOSIS — I4891 Unspecified atrial fibrillation: Secondary | ICD-10-CM | POA: Diagnosis not present

## 2017-02-12 DIAGNOSIS — I447 Left bundle-branch block, unspecified: Secondary | ICD-10-CM | POA: Insufficient documentation

## 2017-02-14 ENCOUNTER — Other Ambulatory Visit: Payer: Self-pay | Admitting: *Deleted

## 2017-02-14 MED ORDER — WARFARIN SODIUM 2.5 MG PO TABS: ORAL_TABLET | ORAL | 0 refills | Status: DC

## 2017-03-09 ENCOUNTER — Telehealth: Payer: Self-pay | Admitting: Cardiology

## 2017-03-09 NOTE — Telephone Encounter (Signed)
°*  STAT* If patient is at the pharmacy, call can be transferred to refill team.   1. Which medications need to be refilled? (please list name of each medication and dose if known) Toprol and Digoxin   2. Which pharmacy/location (including street and city if local pharmacy) is medication to be sent to?[facility] on [address] ,[place]   3. Do they need a 30 day or 90 day supply?[place]

## 2017-03-12 ENCOUNTER — Other Ambulatory Visit: Payer: Self-pay | Admitting: Cardiology

## 2017-03-12 MED ORDER — METOPROLOL TARTRATE 25 MG PO TABS: ORAL_TABLET | ORAL | 1 refills | Status: DC

## 2017-03-12 MED ORDER — DIGOXIN 125 MCG PO TABS: 125.0000 ug | ORAL_TABLET | Freq: Every day | ORAL | 1 refills | Status: DC

## 2017-03-12 NOTE — Telephone Encounter (Signed)
Refill authorization submitted electronically to patient's preferred pharmacy. 

## 2017-03-15 ENCOUNTER — Ambulatory Visit (INDEPENDENT_AMBULATORY_CARE_PROVIDER_SITE_OTHER): Payer: Medicare HMO | Admitting: Pharmacist Clinician (PhC)/ Clinical Pharmacy Specialist

## 2017-03-15 DIAGNOSIS — Z5181 Encounter for therapeutic drug level monitoring: Secondary | ICD-10-CM | POA: Diagnosis not present

## 2017-03-15 DIAGNOSIS — I482 Chronic atrial fibrillation, unspecified: Secondary | ICD-10-CM

## 2017-03-15 DIAGNOSIS — I614 Nontraumatic intracerebral hemorrhage in cerebellum: Secondary | ICD-10-CM

## 2017-03-15 LAB — POCT INR: INR: 1.7

## 2017-03-23 DIAGNOSIS — N401 Enlarged prostate with lower urinary tract symptoms: Secondary | ICD-10-CM | POA: Diagnosis not present

## 2017-03-23 DIAGNOSIS — R351 Nocturia: Secondary | ICD-10-CM | POA: Diagnosis not present

## 2017-04-08 ENCOUNTER — Encounter (HOSPITAL_BASED_OUTPATIENT_CLINIC_OR_DEPARTMENT_OTHER): Payer: Self-pay | Admitting: Emergency Medicine

## 2017-04-08 ENCOUNTER — Emergency Department (HOSPITAL_BASED_OUTPATIENT_CLINIC_OR_DEPARTMENT_OTHER)
Admission: EM | Admit: 2017-04-08 | Discharge: 2017-04-08 | Disposition: A | Discharge status: Home / Self Care | Payer: Medicare HMO | Attending: Emergency Medicine | Admitting: Emergency Medicine

## 2017-04-08 ENCOUNTER — Emergency Department (HOSPITAL_BASED_OUTPATIENT_CLINIC_OR_DEPARTMENT_OTHER): Payer: Medicare HMO

## 2017-04-08 DIAGNOSIS — Z7901 Long term (current) use of anticoagulants: Secondary | ICD-10-CM | POA: Diagnosis not present

## 2017-04-08 DIAGNOSIS — Z79899 Other long term (current) drug therapy: Secondary | ICD-10-CM | POA: Insufficient documentation

## 2017-04-08 DIAGNOSIS — G8929 Other chronic pain: Secondary | ICD-10-CM | POA: Insufficient documentation

## 2017-04-08 DIAGNOSIS — I10 Essential (primary) hypertension: Secondary | ICD-10-CM | POA: Diagnosis not present

## 2017-04-08 DIAGNOSIS — M48062 Spinal stenosis, lumbar region with neurogenic claudication: Secondary | ICD-10-CM | POA: Diagnosis not present

## 2017-04-08 DIAGNOSIS — M545 Low back pain: Secondary | ICD-10-CM | POA: Diagnosis not present

## 2017-04-08 DIAGNOSIS — I252 Old myocardial infarction: Secondary | ICD-10-CM | POA: Insufficient documentation

## 2017-04-08 DIAGNOSIS — I2510 Atherosclerotic heart disease of native coronary artery without angina pectoris: Secondary | ICD-10-CM | POA: Insufficient documentation

## 2017-04-08 DIAGNOSIS — M549 Dorsalgia, unspecified: Secondary | ICD-10-CM | POA: Diagnosis not present

## 2017-04-08 DIAGNOSIS — G9519 Other vascular myelopathies: Secondary | ICD-10-CM | POA: Diagnosis not present

## 2017-04-08 HISTORY — DX: Rheumatic mitral stenosis: I05.0

## 2017-04-08 MED ORDER — LIDOCAINE 5 % EX PTCH: 1.0000 | MEDICATED_PATCH | CUTANEOUS | 0 refills | Status: AC

## 2017-04-08 MED ORDER — ACETAMINOPHEN 500 MG PO TABS: 1000.0000 mg | ORAL_TABLET | Freq: Three times a day (TID) | ORAL | 0 refills | Status: AC

## 2017-04-08 NOTE — ED Provider Notes (Signed)
[place] EMERGENCY DEPARTMENT Provider Note  CSN: [id number] Arrival date & time: 04/08/17 1710  Chief Complaint(s) Back Pain  HPI Wayne [name] is a 83 y.o. male with a extensive past medical history listed below including L4/5 moderate stenosis which was noted in a myelogram April of this year which was obtained due to bladder and bowel incontinence.  This was deemed not severe enough to cause the patient's symptoms. The patient presents today for chronic lower back pain that has been exacerbated over the past couple of days, resulting in difficulty ambulating due to the pain.  He denies any change in the bladder or bowel incontinence.  He reports that this is intermittent in nature and has not worsened.  He does endorse posterior leg pain with ambulation.  This seems to be intermittent in nature.  Relieved by sitting down.  Denies any recent falls, fevers, chills, instrumentation, dysuria, abdominal pain, flank pain.  He denies any lower extremity weakness or loss in sensation.  No history of cancer.  HPI  Past Medical History Past Medical History:  Diagnosis Date  . Arthritis   . Atrial fibrillation (HCC)    Chronic  . BPH (benign prostatic hyperplasia)   . CAD (coronary artery disease)   . Chronic anticoagulation   . History of chicken pox   . HTN (hypertension)   . Mitral stenosis   . Old MI (myocardial infarction) 2007   s/p PCI to distal OM (no stent)  . 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 Benign colonic polyp- 25 yrs ago.  11/16/2016  . Vitamin D deficiency 11/16/2016  . H/O non anemic vitamin B12 deficiency 11/16/2016  . Mild Colonic dysmotility 11/16/2016  . Hypokalemia 09/13/2016  . BPH with obstruction/lower urinary tract symptoms 09/13/2016  . Urinary frequency 07/10/2016  . Left bundle branch block (LBBB) on electrocardiogram 06/30/2016  . History of stroke 06/30/2016  . Generalized weakness  06/30/2016  . Osteoarthritis 04/04/2016  . Encounter for therapeutic drug monitoring 08/06/2013  . Chronic anticoagulation   . HTN (hypertension)   . CAD (coronary artery disease)   . Chronic atrial fibrillation ([place]) 09/13/2010  . h/o CVA (cerebrovascular accident due to intracerebral hemorrhage) 09/13/2010   Home Medication(s) Prior to Admission medications   Medication Sig Start Date End Date Taking? Authorizing Provider  alfuzosin (UROXATRAL) 10 MG 24 hr tablet Take 10 mg by mouth daily with breakfast.   Yes [provider]  finasteride (PROSCAR) 5 MG tablet Take 1 tablet (5 mg total) by mouth daily. 02/14/17  Yes Lelon Perla, MD  methocarbamol (ROBAXIN) 500 MG tablet Take 500 mg by mouth every 6 (six) hours.   Yes [provider]  metoprolol tartrate (LOPRESSOR) 25 MG tablet TAKE ONE TABLET BY MOUTH IN THE MORNING, THEN TAKE ONE-HALF IN THE EVENING 03/13/17  Yes Lelon Perla, MD  warfarin (COUMADIN) 2.5 MG tablet Take 1 to 2 tablets daily as directed by coumadin Clinic. 02/14/17  Yes Lelon Perla, MD  acetaminophen (TYLENOL) 500 MG tablet Take 2 tablets (1,000 mg total) by mouth every 8 (eight) hours. Do not take more than 4000 mg of acetaminophen (Tylenol) in a 24-hour period. Please note that other medicines that you may be prescribed may have Tylenol as well. 04/08/17 04/13/17  Fatima Blank, MD  Cholecalciferol (VITAMIN D3) 2000 units TABS Take 2,000 Units by mouth daily.    [provider]  Cyanocobalamin (VITAMIN  B 12 PO) Take 1 tablet by mouth daily.    [provider]  digoxin (LANOXIN) 0.125 MG tablet TAKE ONE TABLET BY MOUTH ONCE DAILY 03/13/17   Lelon Perla, MD  lidocaine (LIDODERM) 5 % Place 1 patch onto the skin daily. Remove & Discard patch within 12 hours or as directed by MD 04/08/17 05/08/17  Fatima Blank, MD  sennosides-docusate sodium (SENOKOT-S) 8.6-50 MG tablet Take 1 tablet by mouth 2 (two) times  daily. 09/29/16   Gareth Morgan, MD  triamcinolone (KENALOG) 0.1 % cream Apply 1 application topically 2 (two) times daily as needed (itching). As directed by P.A. Lennie Odor with Dr. Allyson Sabal     [provider]                                                                                                                                    Past Surgical History Past Surgical History:  Procedure Laterality Date  . CORONARY ANGIOPLASTY  2007   Distal OM  . PROSTATE BIOPSY     Family History Family History  Problem Relation Age of Onset  . Heart disease Mother   . Heart attack Mother   . Heart disease Father   . Heart attack Father   . Heart attack Brother   . Heart attack Sister   . Stroke Brother     Social History Social History  Substance Use Topics  . Smoking status: Never Smoker  . Smokeless tobacco: Never Used  . Alcohol use No   Allergies Prednisone; Procaine hcl; Levofloxacin; Oxycodone; Penicillins; and Tylenol [acetaminophen]  Review of Systems Review of Systems All other systems are reviewed and are negative for acute change except as noted in the HPI  Physical Exam Vital Signs  I have reviewed the triage vital signs BP 129/78 (BP Location: Left Arm)   Pulse 74   Temp 97.9 F (36.6 C) (Oral)   Resp 20   SpO2 97%   Physical Exam  Constitutional: He is oriented to person, place, and time. He appears well-developed and well-nourished. No distress.  HENT:  Head: Normocephalic and atraumatic.  Right Ear: External ear normal.  Left Ear: External ear normal.  Nose: Nose normal.  Mouth/Throat: Mucous membranes are normal. No trismus in the jaw.  Eyes: Conjunctivae and EOM are normal. No scleral icterus.  Neck: Normal range of motion and phonation normal.  Cardiovascular: Normal rate and regular rhythm.   Pulmonary/Chest: Effort normal. No stridor. No respiratory distress.  Abdominal: He exhibits no distension.  Musculoskeletal: Normal range of  motion. He exhibits no edema.       Lumbar back: He exhibits tenderness (MILD). He exhibits no bony tenderness.       Back:  Neurological: He is alert and oriented to person, place, and time.  Spine Exam: Strength: 5/5 throughout LE bilaterally (hip flexion/extension, adduction/abduction; knee flexion/extension; foot dorsiflexion/plantarflexion, inversion/eversion; great toe inversion) Sensation: Intact  to light touch in proximal and distal LE bilaterally Reflexes: 1+ quadriceps and achilles reflexes  Skin: He is not diaphoretic.  Psychiatric: He has a normal mood and affect. His behavior is normal.  Vitals reviewed.   ED Results and Treatments Labs (all labs ordered are listed, but only abnormal results are displayed) Labs Reviewed - No data to display                                                                                                                       EKG  EKG Interpretation  Date/Time:    Ventricular Rate:    PR Interval:    QRS Duration:   QT Interval:    QTC Calculation:   R Axis:     Text Interpretation:        Radiology Dg Lumbar Spine 2-3 Views  Result Date: 04/08/2017 CLINICAL DATA:  Back pain EXAM: LUMBAR SPINE - 2-3 VIEW COMPARISON:  CT myelogram 10/02/2016 FINDINGS: Normal alignment. Chronic fracture superior endplate of L3 is unchanged. No acute fracture or mass. Mild disc degeneration L1-2 and L2-3. IMPRESSION: No acute abnormality.  Chronic fracture L3 Electronically Signed   By: Franchot Gallo M.D.   On: 04/08/2017 19:33   Pertinent labs & imaging results that were available during my care of the patient were reviewed by me and considered in my medical decision making (see chart for details).  Medications Ordered in ED Medications - No data to display                                                                                                                                  Procedures Procedures  (including critical care  time)  Medical Decision Making / ED Course I have reviewed the nursing notes for this encounter and the patient's prior records (if available in EHR or on provided paperwork).    83 y.o. male presents with chronic back pain.  Patient is describing spinal claudication. No acute traumatic onset. No red flag symptoms of fever, weight loss, saddle anesthesia, weakness, persistent fecal/urinary incontinence or urinary retention.   Plain film of the lumbar spine was obtained to rule out any compression fractures or acute changes, which revealed chronic L3 fracture but no new acute findings.  Patient does seem to have spinal stenosis claudication but does not appear to have findings consistent with cauda equina at this time.  Do not feel that  MRI is necessary at this time.   No pulsating abdominal mass concerning for AAA.  The patient is safe for discharge with strict return precautions.   Final Clinical Impression(s) / ED Diagnoses Final diagnoses:  Chronic bilateral low back pain without sciatica  Intermittent spinal claudication (HCC)   Disposition: Discharge  Condition: Good  I have discussed the results, Dx and Tx plan with the patient and daughter who expressed understanding and agree(s) with the plan. Discharge instructions discussed at great length. The patient and daughter was given strict return precautions who verbalized understanding of the instructions. No further questions at time of discharge.    New Prescriptions   ACETAMINOPHEN (TYLENOL) 500 MG TABLET    Take 2 tablets (1,000 mg total) by mouth every 8 (eight) hours. Do not take more than 4000 mg of acetaminophen (Tylenol) in a 24-hour period. Please note that other medicines that you may be prescribed may have Tylenol as well.   LIDOCAINE (LIDODERM) 5 %    Place 1 patch onto the skin daily. Remove & Discard patch within 12 hours or as directed by MD    Follow Up: [name], DO [place]  [phone]  Schedule an appointment as soon as possible for a visit    Orthopedic surgeon  Schedule an appointment as soon as possible for a visit  in 3-5 days for close follow up to assess for spinal claudication.      This chart was dictated using voice recognition software.  Despite best efforts to proofread,  errors can occur which can change the documentation meaning.   Fatima Blank, MD 04/08/17 2011

## 2017-04-08 NOTE — ED Notes (Signed)
Pt and daughter given d/c instructions as per chart. Rx x 2 Verbalizes understanding. No questions.

## 2017-04-08 NOTE — ED Notes (Signed)
ED Provider at bedside. 

## 2017-04-08 NOTE — ED Triage Notes (Addendum)
PT presents with chronic pain in his low back that he has had for a couple years.  but now has pain in his legs and buttocks. PT states pain in his legs and buttocks are affecting his sleep and walking and sitting. Pt unable to sit still in triage. PT has recently lost control of bladder and bowel .

## 2017-04-10 ENCOUNTER — Telehealth: Payer: Self-pay | Admitting: Family Medicine

## 2017-04-10 ENCOUNTER — Ambulatory Visit (INDEPENDENT_AMBULATORY_CARE_PROVIDER_SITE_OTHER): Payer: Medicare HMO | Admitting: Pharmacist Clinician (PhC)/ Clinical Pharmacy Specialist

## 2017-04-10 DIAGNOSIS — I482 Chronic atrial fibrillation, unspecified: Secondary | ICD-10-CM

## 2017-04-10 DIAGNOSIS — Z5181 Encounter for therapeutic drug level monitoring: Secondary | ICD-10-CM

## 2017-04-10 DIAGNOSIS — I614 Nontraumatic intracerebral hemorrhage in cerebellum: Secondary | ICD-10-CM

## 2017-04-10 LAB — POCT INR: INR: 2.1

## 2017-04-10 NOTE — Telephone Encounter (Signed)
Patient's wife/Sue called states she took patient to [facility] ED on 10/21 --  Dr. Leonette Monarch / Ed provider--- prescribed Pt (Lidocaine patch 5%)-- [place] states [employer] denied Rx for lack of Preauthorization-  - Pt's wife requested Dr. Hershal Coria assistance in getting patches approved-- advised wife that she needed to call [facility]/ ED office mgr for assistance, & I would send message to Dr. Colman Cater staff to see what they can do---  Pls call pt's wife/Sue at [phone] or cell# [phone] with f/u if successful.  --glh

## 2017-04-12 ENCOUNTER — Telehealth: Payer: Self-pay | Admitting: Family Medicine

## 2017-04-12 NOTE — Telephone Encounter (Signed)
Pt's wife called again states that she has [employer] (pre-auth phone# and wishes PCP/ medical assistant to call to get Rx for the patch authorized---   [employer]/Medicare replacement plan [phone] --glh

## 2017-04-13 ENCOUNTER — Telehealth (HOSPITAL_BASED_OUTPATIENT_CLINIC_OR_DEPARTMENT_OTHER): Payer: Self-pay | Admitting: Emergency Medicine

## 2017-04-13 NOTE — Telephone Encounter (Signed)
Called patient, unable to reach by phone. mother

## 2017-04-13 NOTE — Telephone Encounter (Signed)
Please advise if you want me to do the prior auth for medication. Thank MPulliam, CMA/RT(R)

## 2017-04-13 NOTE — Telephone Encounter (Signed)
I have sent message to Dr Raliegh Scarlet to see if we can do prior auth under her name. MPulliam, CMA/RT(R)

## 2017-04-15 NOTE — Telephone Encounter (Signed)
Elsworth Soho, we chatted in person about this one.  Not sure if you recall, so answer was "yes, thanks!"

## 2017-04-17 ENCOUNTER — Telehealth: Payer: Self-pay | Admitting: Family Medicine

## 2017-04-17 NOTE — Telephone Encounter (Signed)
Missy is calling you back about an appeal on lidocaine patches and can be reached at [phone]

## 2017-04-18 NOTE — Telephone Encounter (Signed)
Patient's insurance denied the appeal for the patches, called the patient and notified.  Patient will follow up with our office as needed for the back pain. MPulliam, CMA/RT(R)

## 2017-04-26 ENCOUNTER — Encounter: Payer: Self-pay | Admitting: Family Medicine

## 2017-04-26 ENCOUNTER — Ambulatory Visit (INDEPENDENT_AMBULATORY_CARE_PROVIDER_SITE_OTHER): Payer: Medicare HMO | Admitting: Family Medicine

## 2017-04-26 VITALS — BP 124/71 | HR 89 | Ht 71.0 in | Wt 179.1 lb

## 2017-04-26 DIAGNOSIS — M4699 Unspecified inflammatory spondylopathy, multiple sites in spine: Secondary | ICD-10-CM

## 2017-04-26 DIAGNOSIS — M47819 Spondylosis without myelopathy or radiculopathy, site unspecified: Secondary | ICD-10-CM

## 2017-04-26 DIAGNOSIS — M48061 Spinal stenosis, lumbar region without neurogenic claudication: Secondary | ICD-10-CM

## 2017-04-26 DIAGNOSIS — M5416 Radiculopathy, lumbar region: Secondary | ICD-10-CM | POA: Insufficient documentation

## 2017-04-26 NOTE — Patient Instructions (Addendum)
Please obtain pain medicines and medicines for this condition per your orthopedic surgeon  --As we discussed please call Dr. Cay Schillings office and schedule follow-up.  Please see the handouts I gave you on minimally invasive procedures that can help your condition that you can discuss with your orthopedic surgeon.    Below, you can try a TENS unit which may help with the pain in your back.  Also, even though it usually does not feel immediately better, ice is always a better option to help with your pain relief.  15-20 minutes 3-4 times per day.  Also when you are using the Robaxin given to by her orthopedic surgeon, since it is causing some dizziness please make sure you are using a walker to help assist with ambulation.     TENS UNIT:  This is helpful for muscle pain and spasm.   Search and Purchase a TENS 7000 2nd edition at www.tenspros.com. It should be less than $30.     TENS unit instructions: Do not shower or bathe with the unit on Turn the unit off before removing electrodes or batteries If the electrodes lose stickiness add a drop of water to the electrodes after they are disconnected from the unit and place on plastic sheet. If you continued to have difficulty, call the TENS unit company to purchase more electrodes. Do not apply lotion on the skin area prior to use. Make sure the skin is clean and dry as this will help prolong the life of the electrodes. After use, always check skin for unusual red areas, rash or other skin difficulties. If there are any skin problems, does not apply electrodes to the same area. Never remove the electrodes from the unit by pulling the wires. Do not use the TENS unit or electrodes other than as directed. Do not change electrode placement without consultating your therapist or physician. Keep 2 fingers with between each electrode. Wear time ratio is 2:1, on to off times.    For example on for 30 minutes off for 15 minutes and then on for 30  minutes off for 15 minutes

## 2017-04-26 NOTE — Progress Notes (Signed)
Pt here for an acute care OV today   Impression and Recommendations:    1. Spinal stenosis at L4-L5 level   2. 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 Lumbar radiculopathy, chronic     --As we discussed please call Dr. Gorden Harms office and schedule follow-up.  Please see the handouts I gave you on minimally invasive procedures that can help your condition that you can discuss with your orthopedic surgeon.    Below, you can try a TENS unit which may help with the pain in your back.  Also, even though it usually does not feel immediately better, ice is always a better option to help with your pain relief.  15-20 minutes 3-4 times per day.  Also when you are using the Robaxin given to by her orthopedic surgeon, since it is causing some dizziness please make sure you are using a walker to help assist with ambulation.  Gross side effects, risk and benefits, and alternatives of medications and treatment plan in general discussed with patient.  Patient is aware that all medications have potential side effects and we are unable to predict every side effect or drug-drug interaction that may occur.   Patient will call with any questions prior to using medication if they have concerns.  Expresses verbal understanding and consents to current therapy and treatment regimen.  No barriers to understanding were identified.  Red flag symptoms and signs discussed in detail.  Patient expressed understanding regarding what to do in case of emergency\urgent symptoms  Please see AVS handed out to patient at the end of our visit for further patient instructions/ counseling done pertaining to today's office visit.   Return for Follow-up as planned for chronic care in 3 weeks.     Note: This document was prepared using Dragon voice recognition software and may include unintentional dictation errors.  [name] 2:29  PM --------------------------------------------------------------------------------------------------------------------------------------------------------------------------------------------------------------------------------------------    Subjective:    CC:  Chief Complaint  Patient presents with  . Leg Pain    HPI: Wayne [name] is a 83 y.o. male who presents to [facility] today for issues as discussed below.  Patient is here for an acute visit for back pain that is radiating to his buttocks and legs.  Have not seen him since 11/16/2016 which was the first time I had seen him.  Patient was told to come in in 6 months which he will be seeing seeing me in the near future for his chronic follow-up  Legs hurting and buttocks hurting- can't sit still.  Dr Cay Schillings-  [facility]- is who pt is seeing for this condition.  Patient has radicular symptoms into his buttock as well as down both of his legs.  Worse on the right side.  pt is getting robaxin from them.   Patient was seen in the emergency room for the symptoms on 04/09/2017 and was told to follow-up with his orthopedic surgeon in 3-5 days for close follow-up of his spinal claudication and patient never did.  Pain is worse- used to be only having nighttime symptoms and now he has the symptoms all day and night.  Only relief he gets is when he is laying down flat.  Now- pain better at night- with lying down and worse with moving in the day.   Pt has had several x-rays at Dr. Rose Fillers office as well as an MRI as of late.  I do not have  these records for my review.   Here is the latest myelogram and CT L-spine that was done on October 02, 2016. EXAM: LUMBAR MYELOGRAM  FLUOROSCOPY TIME:  21 seconds corresponding to a Dose Area Product of 242.19 Gy*m2  PROCEDURE: After thorough discussion of risks and benefits of the procedure including bleeding, infection, injury to nerves, blood vessels, adjacent  structures as well as headache and CSF leak, written and oral informed consent was obtained. Consent was obtained by Dr. Rolla Flatten. Time out form was completed.  Patient was positioned prone on the fluoroscopy table. Local anesthesia was provided with 1% lidocaine without epinephrine after prepped and draped in the usual sterile fashion. Puncture was performed at L5-S1 using a 3 1/2 inch 22-gauge spinal needle via midline approach. Using a single pass through the dura, the needle was placed within the thecal sac, with return of clear CSF. 15 mL of Isovue-M 200 was injected into the thecal sac, with normal opacification of the nerve roots and cauda equina consistent with free flow within the subarachnoid space.  I personally performed the lumbar puncture and administered the intrathecal contrast. I also personally supervised acquisition of the myelogram images.  TECHNIQUE: Contiguous axial images were obtained through the Lumbar spine after the intrathecal infusion of infusion. Coronal and sagittal reconstructions were obtained of the axial image sets.  COMPARISON:  None  FINDINGS: LUMBAR MYELOGRAM FINDINGS:  Good opacification lumbar subarachnoid space. Moderate stenosis at L4-5 with BILATERAL L5 nerve root effacement. Anatomic alignment with patient prone. Asymmetric effacement of the LEFT S1 nerve root at L5-S1, more evident on standing radiographs. Standing flexion extension demonstrates anatomic alignment without dynamic instability. Vascular calcification noted.  CT LUMBAR MYELOGRAM FINDINGS:  Segmentation: Normal.  Alignment:  Normal.  Vertebrae: No worrisome osseous lesion.Chronic compression deformity at L3 has healed. No retropulsion.  Conus medullaris: Normal in size. Slightly low termination at L2. No evidence for thickened filum terminale.  Paraspinal tissues: No evidence for hydronephrosis or paravertebral mass. Aortic atherosclerosis extends  into both iliac vessels. No visible bladder distention. No visible presacral mass.  Disc levels:  L1-L2:  Osseous spurring.  No impingement.  L2-L3: Osseous spurring. Facet arthropathy. Mild stenosis without impingement.  L3-L4: Facet arthropathy and ligamentum flavum hypertrophy. No impingement.  L4-L5: Central protrusion. Posterior element hypertrophy. Moderate stenosis. Slight BILATERAL L5 nerve root effacement.  L5-S1: Annular bulge. Posterior element hypertrophy, worse on the LEFT due to bony overgrowth. LEFT S1 nerve root effacement.  IMPRESSION: LUMBAR MYELOGRAM IMPRESSION:  Moderate multifactorial stenosis at L4-5. BILATERAL L5 nerve root effacement. No dynamic instability.  CT LUMBAR MYELOGRAM IMPRESSION:  Moderate stenosis at L4-5 related to central protrusion and posterior element hypertrophy. Slight BILATERAL L5 nerve root effacement.  The moderate degree of stenosis observed at L4-5 does not appear severe enough to result in bowel or bladder dysfunction. There is no critical spinal stenosis or significant compression of the conus/cauda equina.   Electronically Signed   By: Staci Righter M.D.   On: 10/02/2016 11:59    Problem  Arthritis of Facet Joints At Multiple Vertebral Levels (Hcc)  Spinal Stenosis At L4-L5 Level  Lumbar Radiculopathy, Chronic     Wt Readings from Last 3 Encounters:  04/26/17 179 lb 1.6 oz (81.2 kg)  02/01/17 178 lb (80.7 kg)  11/16/16 184 lb (83.5 kg)   BP Readings from Last 3 Encounters:  04/26/17 124/71  04/08/17 131/69  02/01/17 114/62   BMI Readings from Last 3 Encounters:  04/26/17 24.98 kg/m  02/01/17  24.83 kg/m  11/16/16 25.66 kg/m     Patient Care Team    Relationship Specialty Notifications Start End  [name], DO PCP - General Family Medicine  10/23/16   Lelon Perla, MD Consulting Physician Cardiology  10/23/16   Jarome Matin, MD Consulting Physician Dermatology  10/23/16     Kathie Rhodes, MD Consulting Physician Urology  10/23/16   Latanya Maudlin, MD Consulting Physician Orthopedic Surgery  11/16/16    Comment: back pain     Patient Active Problem List   Diagnosis Date Noted  . Vitamin D deficiency 11/16/2016    Priority: High  . H/O non anemic vitamin B12 deficiency 11/16/2016    Priority: High  . Mild Colonic dysmotility 11/16/2016    Priority: High  . Chronic atrial fibrillation ([place]) 09/13/2010    Priority: High  . Chronic anticoagulation     Priority: Medium  . HTN (hypertension)     Priority: Medium  . CAD (coronary artery disease)     Priority: Medium  . h/o CVA (cerebrovascular accident due to intracerebral hemorrhage) 09/13/2010    Priority: Medium  . BPH with obstruction/lower urinary tract symptoms 09/13/2016    Priority: Low  . 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 Spinal stenosis at L4-L5 level 04/26/2017  . Lumbar radiculopathy, chronic 04/26/2017  . Benign colonic polyp- 25 yrs ago.  11/16/2016  . Hypokalemia 09/13/2016  . Urinary frequency 07/10/2016  . Left bundle branch block (LBBB) on electrocardiogram 06/30/2016  . History of stroke 06/30/2016  . Generalized weakness 06/30/2016  . Osteoarthritis 04/04/2016  . Encounter for therapeutic drug monitoring 08/06/2013    Past Medical history, Surgical history, Family history, Social history, Allergies and Medications have been entered into the medical record, reviewed and changed as needed.    Current Meds  Medication Sig  . Acetaminophen (TYLENOL 8 HOUR PO) Take as needed by mouth.    Allergies:  Allergies  Allergen Reactions  . Prednisone Other (See Comments)    Wheezing  . Procaine Hcl Other (See Comments)    Passed out after being given this at dental appointment  . Levofloxacin Rash  . Oxycodone     constipation  . Penicillins Rash    Has patient had a PCN reaction causing immediate rash, facial/tongue/throat swelling, SOB  or lightheadedness with hypotension: Yes Has patient had a PCN reaction causing severe rash involving mucus membranes or skin necrosis: No Has patient had a PCN reaction that required hospitalization No Has patient had a PCN reaction occurring within the last 10 years: No If all of the above answers are "NO", then may proceed with Cephalosporin use.  . Tylenol [Acetaminophen] Other (See Comments)    Makes him very sleepy if he takes more than one tablet     Review of Systems: General:   Denies fever, chills, unexplained weight loss.  Optho/Auditory:   Denies visual changes, blurred vision/LOV Respiratory:   Denies wheeze, DOE more than baseline levels.  Cardiovascular:   Denies chest pain, palpitations, new onset peripheral edema  Gastrointestinal:   Denies nausea, vomiting, diarrhea, abd pain.  Genitourinary: Denies dysuria, freq/ urgency, flank pain or discharge from genitals.  Endocrine:     Denies hot or cold intolerance, polyuria, polydipsia. Musculoskeletal:   Denies unexplained myalgias, joint swelling, unexplained arthralgias, gait problems.  Skin:  Denies new onset rash, suspicious lesions Neurological:     Denies dizziness, unexplained weakness, numbness  Psychiatric/Behavioral:   Denies mood changes, suicidal  or homicidal ideations, hallucinations    Objective:   Blood pressure 124/71, pulse 89, height 5\' 11"  (1.803 m), weight 179 lb 1.6 oz (81.2 kg). Body mass index is 24.98 kg/m. General:  Well Developed, well nourished, appropriate for stated age.  Neuro:  Alert and oriented,  extra-ocular muscles intact  HEENT:  Normocephalic, atraumatic, neck supple Skin:  no gross rash, warm, pink. Cardiac:  RRR, S1 S2 Respiratory:  ECTA B/L and A/P, Not using accessory muscles, speaking in full sentences- unlabored. Vascular:  Ext warm, no cyanosis apprec.; cap RF less 2 sec. Psych:  No HI/SI, judgement and insight good, Euthymic mood. Full Affect.

## 2017-05-01 DIAGNOSIS — M48061 Spinal stenosis, lumbar region without neurogenic claudication: Secondary | ICD-10-CM | POA: Diagnosis not present

## 2017-05-01 DIAGNOSIS — M7918 Myalgia, other site: Secondary | ICD-10-CM | POA: Diagnosis not present

## 2017-05-01 DIAGNOSIS — R29898 Other symptoms and signs involving the musculoskeletal system: Secondary | ICD-10-CM | POA: Diagnosis not present

## 2017-05-04 ENCOUNTER — Other Ambulatory Visit: Payer: Self-pay | Admitting: Cardiology

## 2017-05-15 DIAGNOSIS — M7918 Myalgia, other site: Secondary | ICD-10-CM | POA: Diagnosis not present

## 2017-05-15 DIAGNOSIS — M48061 Spinal stenosis, lumbar region without neurogenic claudication: Secondary | ICD-10-CM | POA: Diagnosis not present

## 2017-05-15 DIAGNOSIS — M5442 Lumbago with sciatica, left side: Secondary | ICD-10-CM | POA: Diagnosis not present

## 2017-05-15 NOTE — Progress Notes (Deleted)
HPI: FU atrial fibrillation and CAD.Cardiac catheterization 2007 showed ejection fraction 50-55%, 100% mid to distal obtuse marginal. Patient had PCI of the OM at that time. 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 Admitted 12/18 with urinary retention which improved with foley cath. Also with hyponatremia. Then seen 05/24/17 with atrial fibrillation (HR felt to be elevated by urology but 100 upon eval in ER). Last echo 12/18 showed normal LV function, severe AS with mean gradient 44 mmHg, severe AI, severe LAE, flail MV with severe MS (mean gradient 11 mmHg) and severe MR, mild to moderate TR.Since last seen,   Current Outpatient Medications  Medication Sig Dispense Refill  . Acetaminophen (TYLENOL 8 HOUR PO) Take as needed by mouth.    Marland Kitchen alfuzosin (UROXATRAL) 10 MG 24 hr tablet Take 10 mg by mouth daily with breakfast.    . Cholecalciferol (VITAMIN D3) 2000 units TABS Take 2,000 Units by mouth daily.    . digoxin (LANOXIN) 0.125 MG tablet TAKE ONE TABLET BY MOUTH ONCE DAILY 90 tablet 3  . finasteride (PROSCAR) 5 MG tablet Take 1 tablet (5 mg total) by mouth daily.    . methocarbamol (ROBAXIN) 500 MG tablet Take 500 mg by mouth every 6 (six) hours.    . metoprolol tartrate (LOPRESSOR) 25 MG tablet TAKE ONE TABLET BY MOUTH IN THE MORNING, THEN TAKE ONE-HALF IN THE EVENING 135 tablet 3  . triamcinolone (KENALOG) 0.1 % cream Apply 1 application topically 2 (two) times daily as needed (itching). As directed by P.A. Lennie Odor with Dr. Allyson Sabal     . warfarin (COUMADIN) 2.5 MG tablet TAKE 1 TO 2 TABLETS BY MOUTH ONCE DAILY AS DIRECTED BY  COUMADIN  CLINIC 135 tablet 0   No current facility-administered medications for this visit.      Past Medical History:  Diagnosis Date  . Arthritis   . Atrial fibrillation (HCC)    Chronic  . BPH (benign prostatic hyperplasia)   . CAD (coronary artery disease)   . Chronic anticoagulation   . History of chicken  pox   . HTN (hypertension)   . Mitral stenosis   . Old MI (myocardial infarction) 2007   s/p PCI to distal OM (no stent)  . Stroke, embolic [facility])     Past Surgical History:  Procedure Laterality Date  . CORONARY ANGIOPLASTY  2007   Distal OM  . PROSTATE BIOPSY      Social History   Socioeconomic History  . Marital status: Married    Spouse name: Not on file  . Number of children: 3  . Years of education: 66  . Highest education level: Not on file  Social Needs  . Financial resource strain: Not on file  . Food insecurity - worry: Not on file  . Food insecurity - inability: Not on file  . Transportation needs - medical: Not on file  . Transportation needs - non-medical: Not on file  Occupational History  . Not on file  Tobacco Use  . Smoking status: Never Smoker  . Smokeless tobacco: Never Used  Substance and Sexual Activity  . Alcohol use: No  . Drug use: No  . Sexual activity: Not on file  Other Topics Concern  . Not on file  Social History Narrative   Fun: Garden, work in the yard, anything physical and walk daily    Family History  Problem Relation Age of Onset  . Heart disease Mother   .  Heart attack Mother   . Heart disease Father   . Heart attack Father   . Heart attack Brother   . Heart attack Sister   . Stroke Brother     ROS: no fevers or chills, productive cough, hemoptysis, dysphasia, odynophagia, melena, hematochezia, dysuria, hematuria, rash, seizure activity, orthopnea, PND, pedal edema, claudication. Remaining systems are negative.  Physical Exam: Well-developed well-nourished in no acute distress.  Skin is warm and dry.  HEENT is normal.  Neck is supple.  Chest is clear to auscultation with normal expansion.  Cardiovascular exam is regular rate and rhythm.  Abdominal exam nontender or distended. No masses palpated. Extremities show no edema. neuro grossly intact  ECG- personally reviewed  A/P  1  Wayne Ruths, MD

## 2017-05-17 ENCOUNTER — Ambulatory Visit: Payer: Medicare HMO | Admitting: Family Medicine

## 2017-05-17 DIAGNOSIS — M48061 Spinal stenosis, lumbar region without neurogenic claudication: Secondary | ICD-10-CM | POA: Diagnosis not present

## 2017-05-20 ENCOUNTER — Emergency Department (HOSPITAL_BASED_OUTPATIENT_CLINIC_OR_DEPARTMENT_OTHER): Payer: Medicare HMO

## 2017-05-20 ENCOUNTER — Inpatient Hospital Stay (HOSPITAL_BASED_OUTPATIENT_CLINIC_OR_DEPARTMENT_OTHER)
Admission: EM | Admit: 2017-05-20 | Discharge: 2017-05-23 | DRG: 291 | Disposition: A | Discharge status: Home Health | Payer: Medicare HMO | Attending: Internal Medicine | Admitting: Internal Medicine

## 2017-05-20 ENCOUNTER — Other Ambulatory Visit: Payer: Self-pay

## 2017-05-20 ENCOUNTER — Encounter (HOSPITAL_BASED_OUTPATIENT_CLINIC_OR_DEPARTMENT_OTHER): Payer: Self-pay | Admitting: Emergency Medicine

## 2017-05-20 DIAGNOSIS — I509 Heart failure, unspecified: Secondary | ICD-10-CM | POA: Diagnosis not present

## 2017-05-20 DIAGNOSIS — I130 Hypertensive heart and chronic kidney disease with heart failure and stage 1 through stage 4 chronic kidney disease, or unspecified chronic kidney disease: Secondary | ICD-10-CM | POA: Diagnosis not present

## 2017-05-20 DIAGNOSIS — R339 Retention of urine, unspecified: Secondary | ICD-10-CM

## 2017-05-20 DIAGNOSIS — R270 Ataxia, unspecified: Secondary | ICD-10-CM | POA: Diagnosis not present

## 2017-05-20 DIAGNOSIS — N401 Enlarged prostate with lower urinary tract symptoms: Secondary | ICD-10-CM | POA: Diagnosis present

## 2017-05-20 DIAGNOSIS — Z9861 Coronary angioplasty status: Secondary | ICD-10-CM

## 2017-05-20 DIAGNOSIS — M545 Low back pain: Secondary | ICD-10-CM | POA: Diagnosis not present

## 2017-05-20 DIAGNOSIS — Z7901 Long term (current) use of anticoagulants: Secondary | ICD-10-CM

## 2017-05-20 DIAGNOSIS — I080 Rheumatic disorders of both mitral and aortic valves: Secondary | ICD-10-CM | POA: Diagnosis not present

## 2017-05-20 DIAGNOSIS — I482 Chronic atrial fibrillation, unspecified: Secondary | ICD-10-CM | POA: Diagnosis present

## 2017-05-20 DIAGNOSIS — Z8673 Personal history of transient ischemic attack (TIA), and cerebral infarction without residual deficits: Secondary | ICD-10-CM | POA: Diagnosis not present

## 2017-05-20 DIAGNOSIS — R338 Other retention of urine: Secondary | ICD-10-CM | POA: Diagnosis present

## 2017-05-20 DIAGNOSIS — I110 Hypertensive heart disease with heart failure: Secondary | ICD-10-CM | POA: Diagnosis not present

## 2017-05-20 DIAGNOSIS — I252 Old myocardial infarction: Secondary | ICD-10-CM

## 2017-05-20 DIAGNOSIS — D631 Anemia in chronic kidney disease: Secondary | ICD-10-CM | POA: Diagnosis not present

## 2017-05-20 DIAGNOSIS — E871 Hypo-osmolality and hyponatremia: Secondary | ICD-10-CM | POA: Diagnosis not present

## 2017-05-20 DIAGNOSIS — I361 Nonrheumatic tricuspid (valve) insufficiency: Secondary | ICD-10-CM | POA: Diagnosis not present

## 2017-05-20 DIAGNOSIS — Z79899 Other long term (current) drug therapy: Secondary | ICD-10-CM | POA: Diagnosis not present

## 2017-05-20 DIAGNOSIS — I5033 Acute on chronic diastolic (congestive) heart failure: Secondary | ICD-10-CM

## 2017-05-20 DIAGNOSIS — N189 Chronic kidney disease, unspecified: Secondary | ICD-10-CM | POA: Diagnosis present

## 2017-05-20 DIAGNOSIS — G8929 Other chronic pain: Secondary | ICD-10-CM | POA: Diagnosis present

## 2017-05-20 DIAGNOSIS — I2510 Atherosclerotic heart disease of native coronary artery without angina pectoris: Secondary | ICD-10-CM | POA: Diagnosis not present

## 2017-05-20 DIAGNOSIS — I350 Nonrheumatic aortic (valve) stenosis: Secondary | ICD-10-CM

## 2017-05-20 DIAGNOSIS — I5032 Chronic diastolic (congestive) heart failure: Secondary | ICD-10-CM | POA: Diagnosis present

## 2017-05-20 DIAGNOSIS — R0602 Shortness of breath: Secondary | ICD-10-CM | POA: Diagnosis not present

## 2017-05-20 HISTORY — DX: Nonrheumatic aortic (valve) stenosis: I35.0

## 2017-05-20 LAB — URINALYSIS, ROUTINE W REFLEX MICROSCOPIC
Bilirubin Urine: NEGATIVE
GLUCOSE, UA: NEGATIVE mg/dL
Hgb urine dipstick: NEGATIVE
KETONES UR: 15 mg/dL — AB
LEUKOCYTES UA: NEGATIVE
Nitrite: NEGATIVE
PROTEIN: NEGATIVE mg/dL
Specific Gravity, Urine: 1.02 (ref 1.005–1.030)
pH: 6 (ref 5.0–8.0)

## 2017-05-20 LAB — CORTISOL: Cortisol, Plasma: 7.7 ug/dL

## 2017-05-20 LAB — COMPREHENSIVE METABOLIC PANEL
ALBUMIN: 3.1 g/dL — AB (ref 3.5–5.0)
ALT: 17 U/L (ref 17–63)
AST: 23 U/L (ref 15–41)
Alkaline Phosphatase: 46 U/L (ref 38–126)
Anion gap: 7 (ref 5–15)
BILIRUBIN TOTAL: 1.1 mg/dL (ref 0.3–1.2)
BUN: 14 mg/dL (ref 6–20)
CHLORIDE: 92 mmol/L — AB (ref 101–111)
CO2: 23 mmol/L (ref 22–32)
Calcium: 8 mg/dL — ABNORMAL LOW (ref 8.9–10.3)
Creatinine, Ser: 0.68 mg/dL (ref 0.61–1.24)
GFR calc Af Amer: >60 mL/min (ref >60)
GFR calc non Af Amer: >60 mL/min (ref >60)
GLUCOSE: 127 mg/dL — AB (ref 65–99)
POTASSIUM: 4 mmol/L (ref 3.5–5.1)
Sodium: 122 mmol/L — ABNORMAL LOW (ref 135–145)
Total Protein: 6.3 g/dL — ABNORMAL LOW (ref 6.5–8.1)

## 2017-05-20 LAB — CBC WITH DIFFERENTIAL/PLATELET
BASOS ABS: 0.1 10*3/uL (ref 0.0–0.1)
BASOS PCT: 1 %
EOS PCT: 2 %
Eosinophils Absolute: 0.1 10*3/uL (ref 0.0–0.7)
HCT: 31.4 % — ABNORMAL LOW (ref 39.0–52.0)
HEMOGLOBIN: 10.7 g/dL — AB (ref 13.0–17.0)
LYMPHS PCT: 7 %
Lymphs Abs: 0.6 10*3/uL — ABNORMAL LOW (ref 0.7–4.0)
MCH: 28.4 pg (ref 26.0–34.0)
MCHC: 34.1 g/dL (ref 30.0–36.0)
MCV: 83.3 fL (ref 78.0–100.0)
MONO ABS: 0.9 10*3/uL (ref 0.1–1.0)
Monocytes Relative: 10 %
Neutro Abs: 7.2 10*3/uL (ref 1.7–7.7)
Neutrophils Relative %: 80 %
Platelets: 174 10*3/uL (ref 150–400)
RBC: 3.77 MIL/uL — AB (ref 4.22–5.81)
RDW: 15.6 % — ABNORMAL HIGH (ref 11.5–15.5)
WBC: 8.8 10*3/uL (ref 4.0–10.5)

## 2017-05-20 LAB — BRAIN NATRIURETIC PEPTIDE: B NATRIURETIC PEPTIDE 5: 284.4 pg/mL — AB (ref 0.0–100.0)

## 2017-05-20 LAB — PROTIME-INR
INR: 2.79
Prothrombin Time: 29.2 seconds — ABNORMAL HIGH (ref 11.4–15.2)

## 2017-05-20 LAB — OSMOLALITY: Osmolality: 260 mOsm/kg — ABNORMAL LOW (ref 275–295)

## 2017-05-20 LAB — SODIUM, URINE, RANDOM: SODIUM UR: 62 mmol/L

## 2017-05-20 LAB — TROPONIN I

## 2017-05-20 LAB — CREATININE, URINE, RANDOM: Creatinine, Urine: 124.21 mg/dL

## 2017-05-20 MED ORDER — ALFUZOSIN HCL ER 10 MG PO TB24
10.0000 mg | ORAL_TABLET | Freq: Every day | ORAL | Status: DC
  Administered 2017-05-21 – 2017-05-23 (×3): 10 mg via ORAL
  Filled 2017-05-20 (×3): qty 1

## 2017-05-20 MED ORDER — METOPROLOL TARTRATE 25 MG PO TABS
25.0000 mg | ORAL_TABLET | Freq: Every morning | ORAL | Status: DC
  Administered 2017-05-21 – 2017-05-23 (×3): 25 mg via ORAL
  Filled 2017-05-20 (×3): qty 1

## 2017-05-20 MED ORDER — FINASTERIDE 5 MG PO TABS
5.0000 mg | ORAL_TABLET | Freq: Every day | ORAL | Status: DC
  Administered 2017-05-21 – 2017-05-23 (×3): 5 mg via ORAL
  Filled 2017-05-20 (×3): qty 1

## 2017-05-20 MED ORDER — ACETAMINOPHEN 500 MG PO TABS
1000.0000 mg | ORAL_TABLET | Freq: Once | ORAL | Status: AC
  Administered 2017-05-20: 1000 mg via ORAL
  Filled 2017-05-20: qty 2

## 2017-05-20 MED ORDER — SODIUM CHLORIDE 0.9% FLUSH
3.0000 mL | Freq: Two times a day (BID) | INTRAVENOUS | Status: DC
  Administered 2017-05-20 – 2017-05-23 (×6): 3 mL via INTRAVENOUS

## 2017-05-20 MED ORDER — METHOCARBAMOL 500 MG PO TABS
500.0000 mg | ORAL_TABLET | Freq: Four times a day (QID) | ORAL | Status: DC
  Administered 2017-05-22 (×2): 500 mg via ORAL
  Filled 2017-05-20 (×2): qty 1

## 2017-05-20 MED ORDER — DIGOXIN 125 MCG PO TABS
125.0000 ug | ORAL_TABLET | Freq: Every day | ORAL | Status: DC
  Administered 2017-05-21 – 2017-05-23 (×3): 125 ug via ORAL
  Filled 2017-05-20 (×3): qty 1

## 2017-05-20 MED ORDER — SODIUM CHLORIDE 0.9% FLUSH: 3.0000 mL | INTRAVENOUS | Status: DC | PRN

## 2017-05-20 MED ORDER — SODIUM CHLORIDE 0.9 % IV SOLN
250.0000 mL | INTRAVENOUS | Status: DC | PRN
Start: 2017-05-20 — End: 2017-05-23

## 2017-05-20 MED ORDER — VITAMIN D 1000 UNITS PO TABS
2000.0000 [IU] | ORAL_TABLET | Freq: Every day | ORAL | Status: DC
Start: 2017-05-21 — End: 2017-05-23
  Administered 2017-05-21 – 2017-05-23 (×3): 2000 [IU] via ORAL
  Filled 2017-05-20 (×3): qty 2

## 2017-05-20 MED ORDER — POLYETHYLENE GLYCOL 3350 17 G PO PACK
17.0000 g | PACK | Freq: Every day | ORAL | Status: DC | PRN
  Administered 2017-05-21 (×2): 17 g via ORAL
  Filled 2017-05-20 (×2): qty 1

## 2017-05-20 MED ORDER — METOPROLOL TARTRATE 12.5 MG HALF TABLET
12.5000 mg | ORAL_TABLET | Freq: Every day | ORAL | Status: DC
  Administered 2017-05-20 – 2017-05-22 (×3): 12.5 mg via ORAL
  Filled 2017-05-20 (×3): qty 1

## 2017-05-20 MED ORDER — ACETAMINOPHEN 650 MG RE SUPP: 650.0000 mg | Freq: Four times a day (QID) | RECTAL | Status: DC | PRN

## 2017-05-20 MED ORDER — LORAZEPAM 1 MG PO TABS
0.5000 mg | ORAL_TABLET | Freq: Once | ORAL | Status: AC
  Administered 2017-05-20: 0.5 mg via ORAL
  Filled 2017-05-20: qty 1

## 2017-05-20 MED ORDER — ACETAMINOPHEN 325 MG PO TABS
650.0000 mg | ORAL_TABLET | Freq: Four times a day (QID) | ORAL | Status: DC | PRN
  Administered 2017-05-20 – 2017-05-23 (×9): 650 mg via ORAL
  Filled 2017-05-20 (×9): qty 2

## 2017-05-20 MED ORDER — FUROSEMIDE 10 MG/ML IJ SOLN
40.0000 mg | INTRAMUSCULAR | Status: AC
  Administered 2017-05-20: 40 mg via INTRAVENOUS
  Filled 2017-05-20: qty 4

## 2017-05-20 NOTE — ED Triage Notes (Signed)
The patient has been unable to void x 2 -3 days

## 2017-05-20 NOTE — Progress Notes (Signed)
Transfer acceptance note  Dr. Rex Kras, EDP from [facility] contacted [facility] requesting transfer of this patient to [facility] for further evaluation and management.  83 year old male with PMH of permanent A. fib on Coumadin, severe aortic stenosis by echo August 2018, HTN, lower extremity edema, CAD, CVA presented to ED with acute on chronic urinary retention, weight gain, worsening leg edema, chronic low back pain, some dyspnea and possibly hypoxemia. Does not appear in distress. Current vital signs stable except oxygen saturations in the low 90s. Clinical exam per EDP suggestive of volume overload.  Lab work reviewed and significant for sodium 122, mildly elevated BMP, negative troponin, hemoglobin 10.7, chest x-ray suggestive of mild pulmonary edema.  Accepted transfer to [facility] telemetry bed, observation status for evaluation and management of acute on chronic diastolic CHF in the context of worsening aortic stenosis and MR, hyponatremia? Hypervolemic, acute urinary retention and worsening low back pain.  Vernell Leep, MD, FACP, [facility]. [facility] Pager [phone]  If 7PM-7AM, please contact night-coverage [email or web address] Password [email or web address] 05/20/2017, 5:08 PM

## 2017-05-20 NOTE — H&P (Addendum)
TRH H&P   Patient Demographics:    Wayne [name], is a 83 y.o. male  MRN: [id number]   DOB - April 15, 1930  Admit Date - 05/20/2017  Outpatient Primary MD for the patient is [name], DO  Referring MD/NP/PA:  Dr. Rex Kras  Outpatient Specialists:  Dr., Stanford Breed (cardiology)  Patient coming from: home  Chief Complaint  Patient presents with  . Urinary Retention      HPI:    Wayne [name]  is a 83 y.o. male, w CVA, with residual weakness,  CAD, Pafib, Aortic stenosis (severe), moderate MR apparently feeling weak for the past 2 weeks, was having difficulty with urination and therefore presented to ED.  Pt notes slight swelling in his bilateral legs as well as 5lbs of weight gain. Pt denies being on any fluid pill.  Pt denies fever, chills, cough, cp, palp, sob, orthopnea, pnd.  Pt is aware of his severe AS  In ED, pt was apparently noted to have low pox thought pox currently is 98% on RA.    CXR IMPRESSION: Suggestion of mild interstitial pulmonary edema. Bibasilar atelectasis versus infiltrates.  Xray L spine IMPRESSION: 1. Superior endplate compression deformity at L3 is stable. 2. No new interval finding.  Na 122, K 4.0, Bun 14, Creatinine 0.68 Alb 3.1 Ast 23, Alt 17  Wbc 8.8, Hgb 10.7, Plt 174 Trop <0.03  Pt received lasix 40mg  iv x1 in ED.  Pt will be admitted for hypoxia, ?, CHF, and hyponatremia, and severe AS   Review of systems:    In addition to the HPI above,  + chronic pain from back and arthritis  No Fever-chills, No Headache, No changes with Vision or hearing, No problems swallowing food or Liquids, No Chest pain, Cough or Shortness of Breath, No Abdominal pain, No Nausea or Vommitting, Bowel movements are regular, No Blood in stool or Urine, No dysuria, No new skin rashes or bruises, No new joints pains-aches,   No recent  weight gain or loss, No polyuria, polydypsia or polyphagia, No significant Mental Stressors.  A full 10 point Review of Systems was done, except as stated above, all other Review of Systems were negative.   With Past History of the following :    Past Medical History:  Diagnosis Date  . Aortic stenosis   . Arthritis   . Atrial fibrillation (HCC)    Chronic  . BPH (benign prostatic hyperplasia)   . CAD (coronary artery disease)   . Chronic anticoagulation   . History of chicken pox   . HTN (hypertension)   . Mitral stenosis   . Old MI (myocardial infarction) 2007   s/p PCI to distal OM (no stent)  . Stroke, embolic [facility])       Past Surgical History:  Procedure Laterality Date  . CORONARY ANGIOPLASTY  2007   Distal OM  . PROSTATE BIOPSY  Social History:     Social History   Tobacco Use  . Smoking status: Never Smoker  . Smokeless tobacco: Never Used  Substance Use Topics  . Alcohol use: No     Lives -  At home   Mobility -  Walks with walker prior to the past 2 weeks   Family History :     Family History  Problem Relation Age of Onset  . Heart disease Mother   . Heart attack Mother   . Heart disease Father   . Heart attack Father   . Heart attack Brother   . Heart attack Sister   . Stroke Brother       Home Medications:   Prior to Admission medications   Medication Sig Start Date End Date Taking? Authorizing Provider  Acetaminophen (TYLENOL 8 HOUR PO) Take as needed by mouth.    [provider]  alfuzosin (UROXATRAL) 10 MG 24 hr tablet Take 10 mg by mouth daily with breakfast.    [provider]  Cholecalciferol (VITAMIN D3) 2000 units TABS Take 2,000 Units by mouth daily.    [provider]  digoxin (LANOXIN) 0.125 MG tablet TAKE ONE TABLET BY MOUTH ONCE DAILY 03/13/17   Lelon Perla, MD  finasteride (PROSCAR) 5 MG tablet Take 1 tablet (5 mg total) by mouth daily. 02/14/17   Lelon Perla, MD    methocarbamol (ROBAXIN) 500 MG tablet Take 500 mg by mouth every 6 (six) hours.    [provider]  metoprolol tartrate (LOPRESSOR) 25 MG tablet TAKE ONE TABLET BY MOUTH IN THE MORNING, THEN TAKE ONE-HALF IN THE EVENING 03/13/17   Lelon Perla, MD  triamcinolone (KENALOG) 0.1 % cream Apply 1 application topically 2 (two) times daily as needed (itching). As directed by P.A. Lennie Odor with Dr. Allyson Sabal     [provider]  warfarin (COUMADIN) 2.5 MG tablet TAKE 1 TO 2 TABLETS BY MOUTH ONCE DAILY AS DIRECTED BY  COUMADIN  CLINIC 05/04/17   Lelon Perla, MD     Allergies:     Allergies  Allergen Reactions  . Prednisone Other (See Comments)    Wheezing  . Procaine Hcl Other (See Comments)    Passed out after being given this at dental appointment  . Levofloxacin Rash  . Oxycodone     constipation  . Penicillins Rash    Has patient had a PCN reaction causing immediate rash, facial/tongue/throat swelling, SOB or lightheadedness with hypotension: Yes Has patient had a PCN reaction causing severe rash involving mucus membranes or skin necrosis: No Has patient had a PCN reaction that required hospitalization No Has patient had a PCN reaction occurring within the last 10 years: No If all of the above answers are "NO", then may proceed with Cephalosporin use.  . Tylenol [Acetaminophen] Other (See Comments)    Makes him very sleepy if he takes more than one tablet     Physical Exam:   Vitals  Blood pressure 122/65, pulse 79, temperature 99.1 F (37.3 C), temperature source Oral, resp. rate (!) 24, height 5' 10.5" (1.791 m), weight 77.9 kg (171 lb 11.2 oz), SpO2 94 %.   1. General  lying in bed in NAD,   2. Normal affect and insight, Not Suicidal or Homicidal, Awake Alert, Oriented X 3.  3. No F.N deficits, ALL C.Nerves Intact, Strength 5/5 all 4 extremities, Sensation intact all 4 extremities, Plantars down going.  4. Ears and Eyes appear Normal,  Conjunctivae clear, PERRLA. Moist Oral Mucosa.  5. Supple Neck, No JVD, No cervical lymphadenopathy appriciated, No Carotid Bruits.  6. Symmetrical Chest wall movement, Good air movement bilaterally, slight crackles bilateral base, no wheezing.   7. Irr, irr, s1, s2, 2/6 sem rusb with radiation to the neck, 2/6 sem apex  8. Positive Bowel Sounds, Abdomen Soft, No tenderness, No organomegaly appriciated,No rebound -guarding or rigidity.  9.  No Cyanosis, Normal Skin Turgor, No Skin Rash or Bruise.  10. Good muscle tone,  joints appear normal , no effusions, Normal ROM.  11. No Palpable Lymph Nodes in Neck or Axillae  +Varicose veins   Data Review:    CBC Recent Labs  Lab 05/20/17 1416  WBC 8.8  HGB 10.7*  HCT 31.4*  PLT 174  MCV 83.3  MCH 28.4  MCHC 34.1  RDW 15.6*  LYMPHSABS 0.6*  MONOABS 0.9  EOSABS 0.1  BASOSABS 0.1   ------------------------------------------------------------------------------------------------------------------  Chemistries  Recent Labs  Lab 05/20/17 1416  NA 122*  K 4.0  CL 92*  CO2 23  GLUCOSE 127*  BUN 14  CREATININE 0.68  CALCIUM 8.0*  AST 23  ALT 17  ALKPHOS 46  BILITOT 1.1   ------------------------------------------------------------------------------------------------------------------ estimated creatinine clearance is 70.9 mL/min (by C-G formula based on SCr of 0.68 mg/dL). ------------------------------------------------------------------------------------------------------------------ No results for input(s): TSH, T4TOTAL, T3FREE, THYROIDAB in the last 72 hours.  Invalid input(s): FREET3  Coagulation profile Recent Labs  Lab 05/20/17 1416  INR 2.79   ------------------------------------------------------------------------------------------------------------------- No results for input(s): DDIMER in the last 72  hours. -------------------------------------------------------------------------------------------------------------------  Cardiac Enzymes Recent Labs  Lab 05/20/17 1416  TROPONINI <0.03   ------------------------------------------------------------------------------------------------------------------    Component Value Date/Time   BNP 284.4 (H) 05/20/2017 1416     ---------------------------------------------------------------------------------------------------------------  Urinalysis    Component Value Date/Time   COLORURINE YELLOW 05/20/2017 1517   APPEARANCEUR CLEAR 05/20/2017 1517   LABSPEC 1.020 05/20/2017 1517   PHURINE 6.0 05/20/2017 1517   GLUCOSEU NEGATIVE 05/20/2017 1517   HGBUR NEGATIVE 05/20/2017 1517   BILIRUBINUR NEGATIVE 05/20/2017 1517   KETONESUR 15 (A) 05/20/2017 1517   PROTEINUR NEGATIVE 05/20/2017 1517   NITRITE NEGATIVE 05/20/2017 1517   LEUKOCYTESUR NEGATIVE 05/20/2017 1517    ----------------------------------------------------------------------------------------------------------------   Imaging Results:    Dg Chest 2 View  Result Date: 05/20/2017 CLINICAL DATA:  Shortness of breath. EXAM: CHEST  2 VIEW COMPARISON:  07/02/2016 FINDINGS: Top-normal heart size. Suggestion of mild interstitial edema. Bibasilar atelectasis versus infiltrates. No significant pleural effusions. IMPRESSION: Suggestion of mild interstitial pulmonary edema. Bibasilar atelectasis versus infiltrates. Electronically Signed   By: Aletta Edouard M.D.   On: 05/20/2017 15:15   Dg Lumbar Spine 2-3 Views  Result Date: 05/20/2017 CLINICAL DATA:  Chronic low back pain. EXAM: LUMBAR SPINE - 2-3 VIEW COMPARISON:  04/08/2017 FINDINGS: Three views study shows stable appearance superior endplate compression deformity at L3. Loss of disc height L2-3 also stable. No new fracture evident atherosclerotic calcification noted abdominal aorta. SI joints are unremarkable. IMPRESSION: 1.  Superior endplate compression deformity at L3 is stable. 2. No new interval finding. Electronically Signed   By: Misty Stanley M.D.   On: 05/20/2017 15:12   Afib at 80, nl axis, slight j point elevation in v3.    Assessment & Plan:    Principal Problem:   Hyponatremia Active Problems:   Chronic atrial fibrillation (HCC)   Acute on chronic diastolic CHF (congestive heart failure) (HCC)   Aortic stenosis   Generalized Weakness likely secondary to  hyponatremia CT brain r/o CVA  Hyponatremia Check serum osm, cortisol, tsh,  Check urine osm, urine sodium Check cmp in am Hold off on hydration with ns due to concerns for CHF  ? Mild CHF (EF wnl) Severe Aortic stenosis, Moderate MR Tele Trop I q6h x3 Check cardiac echo Cardiology consulted by email for AM consult  Chronic Afib Cont digoxin, cont metoprolol Coumadin pharmacy to dose  Bph Cont finateride Cont uroxatral  Urinary retention Foley in place     DVT Prophylaxis coumadin- SCDs  AM Labs Ordered, also please review Full Orders  Family Communication: Admission, patients condition and plan of care including tests being ordered have been discussed with the patient  who indicate understanding and agree with the plan and Code Status.  Code Status FULL CODE  Likely DC to  home  Condition GUARDED    Consults called: cardiology consult by email   Admission status: inpatient  Time spent in minutes : 45   Jani Gravel M.D on 05/20/2017 at 8:19 PM  Between 7pm to 7am - Pager - [phone]  After 7am go to [email or web address] - password [facility]  [facility] - Office  [phone]

## 2017-05-20 NOTE — ED Notes (Signed)
Patient transported to X-ray 

## 2017-05-20 NOTE — ED Notes (Addendum)
Daughter upset about extended wait time for inpatient bed assignment. Expressing concern over discomfort associated with stretcher. Offered recliner. Patient declined at this time. Patient repositioned on stretcher with pillows and states that was helpful. Patient has been fed with fluids offered. Family requesting update on when patient may possibly have a room at the hospital. Called Patient Placement/Bed Assignment and spoke with Robin. Shirlean Mylar states the system-wide census is high, however, she will assign a bed for the patient, as soon as possible. Updated Advice worker.

## 2017-05-20 NOTE — ED Notes (Addendum)
DAUGHTER BETH COGGIN [phone] Please call with any updates, if not present at bedside.

## 2017-05-20 NOTE — ED Provider Notes (Signed)
[facility] EMERGENCY DEPARTMENT Provider Note   CSN: [id number] Arrival date & time: 05/20/17  1327     History   Chief Complaint Chief Complaint  Patient presents with  . Urinary Retention    HPI Wayne [name] is a 83 y.o. male.  83yo M w/ A fib on coumadin, CAD, CVA, mitral stenosis, BPH, spinal stenosis who p/w urinary retention.  The patient has a long-standing problem of urinary retention and nocturia but for the past 2-3 days he has had a lot of difficulty getting urine out.  He was up 10 times last night to urinate with sometimes not being able to produce any urine.  He reports some lower pressure but denies any abdominal pain.  Daughter notes that he has gained 5-6 pounds over the past week and has swollen ankles which are new.  He denies any associated chest pain or shortness of breath.  No fevers or cough/cold symptoms.  Daughter notes that he has valve problems and has been following with cardiology for this.  He notes that he has chronic low back pain and has been seen in the past for this problem.  He thinks that the pain may be worse recently.   The history is provided by the patient and a relative.    Past Medical History:  Diagnosis Date  . Arthritis   . Atrial fibrillation (HCC)    Chronic  . BPH (benign prostatic hyperplasia)   . CAD (coronary artery disease)   . Chronic anticoagulation   . History of chicken pox   . HTN (hypertension)   . Mitral stenosis   . Old MI (myocardial infarction) 2007   s/p PCI to distal OM (no stent)  . Stroke, embolic [facility])     Patient Active Problem List   Diagnosis Date Noted  . Arthritis of facet joints at multiple vertebral levels ([place]) 04/26/2017  . Spinal stenosis at L4-L5 level 04/26/2017  . Lumbar radiculopathy, chronic 04/26/2017  . Benign colonic polyp- 25 yrs ago.  11/16/2016  . Vitamin D deficiency 11/16/2016  . H/O non anemic vitamin B12 deficiency 11/16/2016  . Mild Colonic dysmotility  11/16/2016  . Hypokalemia 09/13/2016  . BPH with obstruction/lower urinary tract symptoms 09/13/2016  . Urinary frequency 07/10/2016  . Left bundle branch block (LBBB) on electrocardiogram 06/30/2016  . History of stroke 06/30/2016  . Generalized weakness 06/30/2016  . Osteoarthritis 04/04/2016  . Encounter for therapeutic drug monitoring 08/06/2013  . Chronic anticoagulation   . HTN (hypertension)   . CAD (coronary artery disease)   . Chronic atrial fibrillation ([place]) 09/13/2010  . h/o CVA (cerebrovascular accident due to intracerebral hemorrhage) 09/13/2010    Past Surgical History:  Procedure Laterality Date  . CORONARY ANGIOPLASTY  2007   Distal OM  . PROSTATE BIOPSY         Home Medications    Prior to Admission medications   Medication Sig Start Date End Date Taking? Authorizing Provider  Acetaminophen (TYLENOL 8 HOUR PO) Take as needed by mouth.    [provider]  alfuzosin (UROXATRAL) 10 MG 24 hr tablet Take 10 mg by mouth daily with breakfast.    [provider]  Cholecalciferol (VITAMIN D3) 2000 units TABS Take 2,000 Units by mouth daily.    [provider]  digoxin (LANOXIN) 0.125 MG tablet TAKE ONE TABLET BY MOUTH ONCE DAILY 03/13/17   Lelon Perla, MD  finasteride (PROSCAR) 5 MG tablet Take 1 tablet (5 mg total) by  mouth daily. 02/14/17   Lelon Perla, MD  methocarbamol (ROBAXIN) 500 MG tablet Take 500 mg by mouth every 6 (six) hours.    [provider]  metoprolol tartrate (LOPRESSOR) 25 MG tablet TAKE ONE TABLET BY MOUTH IN THE MORNING, THEN TAKE ONE-HALF IN THE EVENING 03/13/17   Lelon Perla, MD  triamcinolone (KENALOG) 0.1 % cream Apply 1 application topically 2 (two) times daily as needed (itching). As directed by P.A. Lennie Odor with Dr. Allyson Sabal     [provider]  warfarin (COUMADIN) 2.5 MG tablet TAKE 1 TO 2 TABLETS BY MOUTH ONCE DAILY AS DIRECTED BY  COUMADIN  CLINIC 05/04/17   Lelon Perla,  MD    Family History Family History  Problem Relation Age of Onset  . Heart disease Mother   . Heart attack Mother   . Heart disease Father   . Heart attack Father   . Heart attack Brother   . Heart attack Sister   . Stroke Brother     Social History Social History   Tobacco Use  . Smoking status: Never Smoker  . Smokeless tobacco: Never Used  Substance Use Topics  . Alcohol use: No  . Drug use: No     Allergies   Prednisone; Procaine hcl; Levofloxacin; Oxycodone; Penicillins; and Tylenol [acetaminophen]   Review of Systems Review of Systems All other systems reviewed and are negative except that which was mentioned in HPI   Physical Exam Updated Vital Signs BP (!) 124/91   Pulse 91   Temp 98 F (36.7 C) (Oral)   Resp 17   Ht 5\' 11"  (1.803 m)   Wt 81.2 kg (179 lb)   SpO2 94%   BMI 24.97 kg/m   Physical Exam  Constitutional: He is oriented to person, place, and time. He appears well-developed and well-nourished. No distress.  HENT:  Head: Normocephalic and atraumatic.  Eyes: Conjunctivae are normal.  Neck: Neck supple.  Cardiovascular: Normal rate and regular rhythm.  Murmur heard.  Systolic murmur is present with a grade of 5/6. Pulmonary/Chest: Effort normal.  Diminished BS b/l bases  Abdominal: Soft. Bowel sounds are normal. He exhibits no distension. There is tenderness (mild suprapubic). There is no rebound and no guarding.  Musculoskeletal: He exhibits edema (mild pitting edema b/l ankles L>R).  Neurological: He is alert and oriented to person, place, and time. No sensory deficit. He exhibits normal muscle tone.  Fluent speech Normal strength and sensation BLE in bed, ambulation deferred  Skin: Skin is warm and dry.  Psychiatric: He has a normal mood and affect.  Nursing note and vitals reviewed.    ED Treatments / Results  Labs (all labs ordered are listed, but only abnormal results are displayed) Labs Reviewed  COMPREHENSIVE METABOLIC  PANEL - Abnormal; Notable for the following components:      Result Value   Sodium 122 (*)    Chloride 92 (*)    Glucose, Bld 127 (*)    Calcium 8.0 (*)    Total Protein 6.3 (*)    Albumin 3.1 (*)    All other components within normal limits  CBC WITH DIFFERENTIAL/PLATELET - Abnormal; Notable for the following components:   RBC 3.77 (*)    Hemoglobin 10.7 (*)    HCT 31.4 (*)    RDW 15.6 (*)    Lymphs Abs 0.6 (*)    All other components within normal limits  BRAIN NATRIURETIC PEPTIDE - Abnormal; Notable for the following components:  B Natriuretic Peptide 284.4 (*)    All other components within normal limits  URINALYSIS, ROUTINE W REFLEX MICROSCOPIC - Abnormal; Notable for the following components:   Ketones, ur 15 (*)    All other components within normal limits  PROTIME-INR - Abnormal; Notable for the following components:   Prothrombin Time 29.2 (*)    All other components within normal limits  TROPONIN I  SODIUM, URINE, RANDOM  CREATININE, URINE, RANDOM    EKG  EKG Interpretation  Date/Time:  Sunday May 20 2017 14:17:43 EST Ventricular Rate:  82 PR Interval:    QRS Duration: 97 QT Interval:  369 QTC Calculation: 431 R Axis:   54 Text Interpretation:  Atrial fibrillation Nonspecific T abnormalities, lateral leads Baseline wander in lead(s) V6 similar to previous Confirmed by Theotis Burrow [phone]) on 05/20/2017 2:56:40 PM       Radiology Dg Chest 2 View  Result Date: 05/20/2017 CLINICAL DATA:  Shortness of breath. EXAM: CHEST  2 VIEW COMPARISON:  07/02/2016 FINDINGS: Top-normal heart size. Suggestion of mild interstitial edema. Bibasilar atelectasis versus infiltrates. No significant pleural effusions. IMPRESSION: Suggestion of mild interstitial pulmonary edema. Bibasilar atelectasis versus infiltrates. Electronically Signed   By: Aletta Edouard M.D.   On: 05/20/2017 15:15   Dg Lumbar Spine 2-3 Views  Result Date: 05/20/2017 CLINICAL DATA:  Chronic low  back pain. EXAM: LUMBAR SPINE - 2-3 VIEW COMPARISON:  04/08/2017 FINDINGS: Three views study shows stable appearance superior endplate compression deformity at L3. Loss of disc height L2-3 also stable. No new fracture evident atherosclerotic calcification noted abdominal aorta. SI joints are unremarkable. IMPRESSION: 1. Superior endplate compression deformity at L3 is stable. 2. No new interval finding. Electronically Signed   By: Misty Stanley M.D.   On: 05/20/2017 15:12    Procedures Procedures (including critical care time)  Medications Ordered in ED Medications  furosemide (LASIX) injection 40 mg (40 mg Intravenous Given 05/20/17 1603)  acetaminophen (TYLENOL) tablet 1,000 mg (1,000 mg Oral Given 05/20/17 1616)     Initial Impression / Assessment and Plan / ED Course  I have reviewed the triage vital signs and the nursing notes.  Pertinent labs & imaging results that were available during my care of the patient were reviewed by me and considered in my medical decision making (see chart for details).     PT w/ cute on chronic urinary retention as well as increased leg swelling and weight gain over the past week.  He was nontoxic on exam with reassuring vital signs.  O2 saturation was 92-95% on room air.  He did have some lower extremity edema but endorses normal sensation and appeared to have normal strength in bed.  EKG similar to previous.  Chest x-ray shows mild interstitial pulmonary edema.  X-ray of lower back is stable from previous.  Labs show normal troponin, BNP 284, INR 2.79, CBC 8.8, hemoglobin 10.7, sodium 122, chloride 92, creatinine 0.68, UA without signs of infection.  Foley catheter placed with greater than 300 mL's output.   His chest x-ray, exam, and low normal oxygen saturation suggest CHF and I suspect that his valvular insufficiency may have reached a critical point.  Although he has BPH and chronic history of urinary retention, his complaint of back pain with urinary  retention does raise the concern for possible cauda equina.  Finally, his hyponatremia is new, unclear etiology.  For all these issues I discussed transfer for admission with Dr. Algis Liming. I appreciate his assistance with the patient's care.  Pt will be transferred for admission.  Final Clinical Impressions(s) / ED Diagnoses   Final diagnoses:  Hyponatremia  Acute congestive heart failure, unspecified heart failure type ([place])  Urinary retention  Low back pain, unspecified back pain laterality, unspecified chronicity, with sciatica presence unspecified    ED Discharge Orders    None       Bentleigh Stankus, Wenda Overland, MD 05/20/17 [phone]

## 2017-05-20 NOTE — ED Notes (Signed)
ED Provider at bedside. 

## 2017-05-21 ENCOUNTER — Inpatient Hospital Stay (HOSPITAL_COMMUNITY): Payer: Medicare HMO

## 2017-05-21 ENCOUNTER — Other Ambulatory Visit: Payer: Self-pay

## 2017-05-21 DIAGNOSIS — I350 Nonrheumatic aortic (valve) stenosis: Secondary | ICD-10-CM

## 2017-05-21 DIAGNOSIS — I361 Nonrheumatic tricuspid (valve) insufficiency: Secondary | ICD-10-CM

## 2017-05-21 DIAGNOSIS — I482 Chronic atrial fibrillation: Secondary | ICD-10-CM

## 2017-05-21 LAB — ECHOCARDIOGRAM COMPLETE
HEIGHTINCHES: 70.5 in
Weight: 2713.6 oz

## 2017-05-21 LAB — COMPREHENSIVE METABOLIC PANEL
ALK PHOS: 48 U/L (ref 38–126)
ALT: 19 U/L (ref 17–63)
AST: 24 U/L (ref 15–41)
Albumin: 2.9 g/dL — ABNORMAL LOW (ref 3.5–5.0)
Anion gap: 11 (ref 5–15)
BILIRUBIN TOTAL: 1.3 mg/dL — AB (ref 0.3–1.2)
BUN: 10 mg/dL (ref 6–20)
CALCIUM: 8.1 mg/dL — AB (ref 8.9–10.3)
CO2: 23 mmol/L (ref 22–32)
CREATININE: 0.74 mg/dL (ref 0.61–1.24)
Chloride: 92 mmol/L — ABNORMAL LOW (ref 101–111)
GFR calc Af Amer: >60 mL/min (ref >60)
Glucose, Bld: 98 mg/dL (ref 65–99)
POTASSIUM: 3.8 mmol/L (ref 3.5–5.1)
Sodium: 126 mmol/L — ABNORMAL LOW (ref 135–145)
TOTAL PROTEIN: 6.1 g/dL — AB (ref 6.5–8.1)

## 2017-05-21 LAB — CBC
HEMATOCRIT: 34.7 % — AB (ref 39.0–52.0)
Hemoglobin: 11.6 g/dL — ABNORMAL LOW (ref 13.0–17.0)
MCH: 27.8 pg (ref 26.0–34.0)
MCHC: 33.4 g/dL (ref 30.0–36.0)
MCV: 83 fL (ref 78.0–100.0)
Platelets: 184 10*3/uL (ref 150–400)
RBC: 4.18 MIL/uL — ABNORMAL LOW (ref 4.22–5.81)
RDW: 15.7 % — AB (ref 11.5–15.5)
WBC: 9.7 10*3/uL (ref 4.0–10.5)

## 2017-05-21 LAB — PROTIME-INR
INR: 2.33
PROTHROMBIN TIME: 25.4 s — AB (ref 11.4–15.2)

## 2017-05-21 LAB — TROPONIN I
Troponin I: <0.03 ng/mL (ref <0.03)
Troponin I: <0.03 ng/mL (ref <0.03)

## 2017-05-21 LAB — DIGOXIN LEVEL: Digoxin Level: <0.2 ng/mL — ABNORMAL LOW (ref 0.8–2.0)

## 2017-05-21 LAB — OSMOLALITY, URINE: Osmolality, Ur: 210 mOsm/kg — ABNORMAL LOW (ref 300–900)

## 2017-05-21 LAB — TSH: TSH: 2.425 u[IU]/mL (ref 0.350–4.500)

## 2017-05-21 MED ORDER — LORAZEPAM 0.5 MG PO TABS
0.5000 mg | ORAL_TABLET | Freq: Once | ORAL | Status: AC
  Administered 2017-05-21: 0.5 mg via ORAL
  Filled 2017-05-21: qty 1

## 2017-05-21 MED ORDER — WARFARIN SODIUM 2.5 MG PO TABS
2.5000 mg | ORAL_TABLET | ORAL | Status: DC
  Administered 2017-05-22: 2.5 mg via ORAL
  Filled 2017-05-21: qty 1

## 2017-05-21 MED ORDER — WARFARIN SODIUM 5 MG PO TABS
5.0000 mg | ORAL_TABLET | ORAL | Status: DC
  Administered 2017-05-21: 5 mg via ORAL
  Filled 2017-05-21: qty 1

## 2017-05-21 MED ORDER — WARFARIN SODIUM 5 MG PO TABS
5.0000 mg | ORAL_TABLET | ORAL | Status: AC
  Administered 2017-05-21: 5 mg via ORAL
  Filled 2017-05-21: qty 1

## 2017-05-21 MED ORDER — WARFARIN - PHARMACIST DOSING INPATIENT
Freq: Every day | Status: DC
  Administered 2017-05-21: 20:00:00

## 2017-05-21 NOTE — Consult Note (Signed)
Cardiology Consult    Patient ID: Wayne [name]; [id number]; [birth year]   Admit date: 05/20/2017 Date of Consult: 05/21/2017  Primary Care Provider: [name], DO Primary Cardiologist: Wayne [name]  Patient Profile    Wayne [name] is a 83 y.o. male with past medical history of CAD (s/p PCI of OM in 2007), permanent atrial fibrillation (on Coumadin), moderate AS, HTN, HLD, and embolic CVA (occurring in 2007) who is being seen today for the evaluation of CHF at the request of Dr. [place] [name].   History of Present Illness    Mr. Wayne [name] was last examined by Wayne [name] in 01/2017 and reported doing well at that time, denying any recent chest pain or dyspnea on exertion. He was continued on his current medication regimen.  He presented to [facility] on 05/20/2017 for evaluation of urinary retention over the past 2-3 days. He reports being in his usual state of health until two days prior when he noticed he was having urgency to urinate but had minimal to no output. He denies any associated symptoms during this time but he daughter had noticed he had started to retain fluid in his lower extremities. He denies any recent chest pain, palpitations, dyspnea on exertion, orthopnea, or PND. He weighs occasionally and noticed this had increased by 5-6 lbs over the past several days.   Initial labs showed WBC of 8.8, Hgb 10.7, platelets 174, Na+ 122, K+ 4.0, creatinine 0.68, and BNP 284. Initial and repeat troponin values have been negative. TSH 2.425. Dig Level < 0.2. CXR showing mild interstitial pulmonary edema with bibasilar atelectasis versus infiltrates. Spinal imaging showing a stable endplate compression deformity at L3 with no acute changes. CT Head with no evidence of an intracranial hemorrhage or acute infarct. EKG showed rate-controlled atrial fibrillation, HR 82. A repeat echocardiogram has been obtained and shows severe focal basal hypertrophy of the septum,  severe aortic stenosis with Valve area (VTI): 0.49 cm^2. Valve area (Vmax): 0.47 cm^2. Valve area (Vmean): 0.53 cm^2, flail motion of the anterior leaflet of the mitral valve with severe mitral stenosis and moderate to severe MR.   He was given 40mg  IV Lasix while in the ED and has not received additional diuresis. Na+ improved to 126 this AM. He has a foley catheter in place and has over 1000 cc in his catheter at this time.     Past Medical History:  Diagnosis Date  . Aortic stenosis   . Arthritis   . Atrial fibrillation (HCC)    Chronic  . BPH (benign prostatic hyperplasia)   . CAD (coronary artery disease)   . Chronic anticoagulation   . History of chicken pox   . HTN (hypertension)   . Mitral stenosis   . Old MI (myocardial infarction) 2007   s/p PCI to distal OM (no stent)  . Stroke, embolic [facility])     Past Surgical History:  Procedure Laterality Date  . CORONARY ANGIOPLASTY  2007   Distal OM  . PROSTATE BIOPSY       Home Medications:  Prior to Admission medications   Medication Sig Start Date End Date Taking? Authorizing Provider  Acetaminophen (TYLENOL 8 HOUR PO) Take as needed by mouth.    [provider]  alfuzosin (UROXATRAL) 10 MG 24 hr tablet Take 10 mg by mouth daily with breakfast.    [provider]  Cholecalciferol (VITAMIN D3) 2000 units TABS Take 2,000 Units by mouth daily.    [provider]  digoxin (LANOXIN) 0.125 MG tablet TAKE ONE TABLET BY MOUTH ONCE DAILY 03/13/17   Wayne Perla, MD  finasteride (PROSCAR) 5 MG tablet Take 1 tablet (5 mg total) by mouth daily. 02/14/17   Wayne Perla, MD  methocarbamol (ROBAXIN) 500 MG tablet Take 500 mg by mouth every 6 (six) hours.    [provider]  metoprolol tartrate (LOPRESSOR) 25 MG tablet TAKE ONE TABLET BY MOUTH IN THE MORNING, THEN TAKE ONE-HALF IN THE EVENING 03/13/17   Wayne Perla, MD  triamcinolone (KENALOG) 0.1 % cream Apply 1 application topically 2 (two)  times daily as needed (itching). As directed by P.A. Wayne [name] with Dr. Allyson [name]     [provider]  warfarin (COUMADIN) 2.5 MG tablet TAKE 1 TO 2 TABLETS BY MOUTH ONCE DAILY AS DIRECTED BY  COUMADIN  CLINIC 05/04/17   Wayne Perla, MD    Inpatient Medications: Scheduled Meds: . alfuzosin  10 mg Oral Q breakfast  . cholecalciferol  2,000 Units Oral Daily  . digoxin  125 mcg Oral Daily  . finasteride  5 mg Oral Daily  . methocarbamol  500 mg Oral Q6H  . metoprolol tartrate  12.5 mg Oral QHS  . metoprolol tartrate  25 mg Oral q morning - 10a  . sodium chloride flush  3 mL Intravenous Q12H  . [START ON 05/22/2017] warfarin  2.5 mg Oral Once per day on Tue Sat  . warfarin  5 mg Oral Once per day on Sun Mon Wed Thu Fri  . Warfarin - Pharmacist Dosing Inpatient   Does not apply q1800   Continuous Infusions: . sodium chloride     PRN Meds: sodium chloride, acetaminophen **OR** acetaminophen, polyethylene glycol, sodium chloride flush  Allergies:    Allergies  Allergen Reactions  . Prednisone Other (See Comments)    Wheezing  . Procaine Hcl Other (See Comments)    Passed out after being given this at dental appointment  . Levofloxacin Rash  . Oxycodone     constipation  . Penicillins Rash    Has patient had a PCN reaction causing immediate rash, facial/tongue/throat swelling, SOB or lightheadedness with hypotension: Yes Has patient had a PCN reaction causing severe rash involving mucus membranes or skin necrosis: No Has patient had a PCN reaction that required hospitalization No Has patient had a PCN reaction occurring within the last 10 years: No If all of the above answers are "NO", then may proceed with Cephalosporin use.  . Tylenol [Acetaminophen] Other (See Comments)    Makes him very sleepy if he takes more than one tablet    Social History:   Social History   Socioeconomic History  . Marital status: Married    Spouse name: Not on file  . Number of  children: 3  . Years of education: 76  . Highest education level: Not on file  Social Needs  . Financial resource strain: Not on file  . Food insecurity - worry: Not on file  . Food insecurity - inability: Not on file  . Transportation needs - medical: Not on file  . Transportation needs - non-medical: Not on file  Occupational History  . Not on file  Tobacco Use  . Smoking status: Never Smoker  . Smokeless tobacco: Never Used  Substance and Sexual Activity  . Alcohol use: No  . Drug use: No  . Sexual activity: Not on file  Other Topics Concern  . Not on file  Social History  Narrative   Fun: Garden, work in the yard, anything physical and walk daily     Family History:    Family History  Problem Relation Age of Onset  . Heart disease Mother   . Heart attack Mother   . Heart disease Father   . Heart attack Father   . Heart attack Brother   . Heart attack Sister   . Stroke Brother       Review of Systems    General:  No chills, fever, night sweats or weight changes.  Cardiovascular:  No chest pain, dyspnea on exertion, orthopnea, palpitations, paroxysmal nocturnal dyspnea. Positive for lower extremity edema.  Dermatological: No rash, lesions/masses Respiratory: No cough, dyspnea Urologic: No hematuria. Positive for urinary retention.  Abdominal:   No nausea, vomiting, diarrhea, bright red blood per rectum, melena, or hematemesis Neurologic:  No visual changes, wkns, changes in mental status.  All other systems reviewed and are otherwise negative except as noted above.  Physical Exam/Data    Vitals:   05/21/17 0023 05/21/17 0635 05/21/17 0639 05/21/17 0815  BP: 123/69 134/79  (!) 116/57  Pulse: 100 (!) 110  85  Resp: 20   18  Temp: 100 F (37.8 C) 98.9 F (37.2 C)  98.2 F (36.8 C)  TempSrc: Oral Oral  Oral  SpO2: 92% (!) 89% 96% 95%  Weight:  169 lb 9.6 oz (76.9 kg)    Height:        Intake/Output Summary (Last 24 hours) at 05/21/2017 1218 Last data  filed at 05/21/2017 5784 Gross per 24 hour  Intake 236 ml  Output 4075 ml  Net -3839 ml   Filed Weights   05/20/17 1348 05/20/17 2000 05/21/17 0635  Weight: 179 lb (81.2 kg) 171 lb 11.2 oz (77.9 kg) 169 lb 9.6 oz (76.9 kg)   Body mass index is 23.99 kg/m.   General: Pleasant elderly Caucasian male appearing in NAD Psych: Normal affect. Neuro: Alert and oriented X 3. Moves all extremities spontaneously. HEENT: Normal  Neck: Supple without bruits or JVD. Lungs:  Resp regular and unlabored, CTA without wheezing or rales. Heart: Irregularly irregular, no s3, s4, 3/6 SEM along RUSB. Abdomen: Soft, non-tender, non-distended, BS + x 4.  Extremities: No clubbing, cyanosis or edema. DP/PT/Radials 2+ and equal bilaterally.   EKG:  The EKG was personally reviewed and demonstrates: rate-controlled atrial fibrillation, HR 82.   Labs/Studies     Relevant CV Studies:  Echocardiogram: 05/21/2017 Study Conclusions  - Left ventricle: The cavity size was normal. There was severe   focal basal hypertrophy of the septum with otherwise mild   concentric hypertrophy. Systolic function was normal. Wall motion   was normal; there were no regional wall motion abnormalities. - Aortic valve: Valve mobility was restricted. There was severe   stenosis. There was no regurgitation. Valve area (VTI): 0.49   cm^2. Valve area (Vmax): 0.47 cm^2. Valve area (Vmean): 0.53   cm^2. - Mitral valve: Severely calcified annulus. Calcification.   Calcification. Mobility was restricted. Flail motion involving   the anterior leaflet. The findings are consistent with severe   stenosis. There was severe regurgitation directed eccentrically   and posteriorly. Valve area by pressure half-time: 1.63 cm^2.   Valve area by continuity equation (using LVOT flow): 0.98 cm^2. - Left atrium: The atrium was severely dilated. - Right ventricle: The cavity size was normal. Wall thickness was   normal. Systolic function was  normal. - Right atrium: The atrium was severely dilated. -  Tricuspid valve: There was mild-moderate regurgitation. - Pulmonary arteries: Systolic pressure was mildly increased. PA   peak pressure: 43 mm Hg (S).  Impressions:  - Mitral regurgitation appears moderate visually but is likely   severe due to the flail leaflet with eccentric regurgitation.  Laboratory Data:  Chemistry Recent Labs  Lab 05/20/17 1416 05/21/17 0528  NA 122* 126*  K 4.0 3.8  CL 92* 92*  CO2 23 23  GLUCOSE 127* 98  BUN 14 10  CREATININE 0.68 0.74  CALCIUM 8.0* 8.1*  GFRNONAA >60 >60  GFRAA >60 >60  ANIONGAP 7 11    Recent Labs  Lab 05/20/17 1416 05/21/17 0528  PROT 6.3* 6.1*  ALBUMIN 3.1* 2.9*  AST 23 24  ALT 17 19  ALKPHOS 46 48  BILITOT 1.1 1.3*   Hematology Recent Labs  Lab 05/20/17 1416 05/21/17 0528  WBC 8.8 9.7  RBC 3.77* 4.18*  HGB 10.7* 11.6*  HCT 31.4* 34.7*  MCV 83.3 83.0  MCH 28.4 27.8  MCHC 34.1 33.4  RDW 15.6* 15.7*  PLT 174 184   Cardiac Enzymes Recent Labs  Lab 05/20/17 1416 05/20/17 2355 05/21/17 0528 05/21/17 1109  TROPONINI <0.03 <0.03 <0.03 <0.03   No results for input(s): TROPIPOC in the last 168 hours.  BNP Recent Labs  Lab 05/20/17 1416  BNP 284.4*    DDimer No results for input(s): DDIMER in the last 168 hours.  Radiology/Studies:  Dg Chest 2 View  Result Date: 05/20/2017 CLINICAL DATA:  Shortness of breath. EXAM: CHEST  2 VIEW COMPARISON:  07/02/2016 FINDINGS: Top-normal heart size. Suggestion of mild interstitial edema. Bibasilar atelectasis versus infiltrates. No significant pleural effusions. IMPRESSION: Suggestion of mild interstitial pulmonary edema. Bibasilar atelectasis versus infiltrates. Electronically Signed   By: Aletta Edouard M.D.   On: 05/20/2017 15:15   Dg Lumbar Spine 2-3 Views  Result Date: 05/20/2017 CLINICAL DATA:  Chronic low back pain. EXAM: LUMBAR SPINE - 2-3 VIEW COMPARISON:  04/08/2017 FINDINGS: Three views study  shows stable appearance superior endplate compression deformity at L3. Loss of disc height L2-3 also stable. No new fracture evident atherosclerotic calcification noted abdominal aorta. SI joints are unremarkable. IMPRESSION: 1. Superior endplate compression deformity at L3 is stable. 2. No new interval finding. Electronically Signed   By: Misty Stanley M.D.   On: 05/20/2017 15:12   Ct Head Wo Contrast  Result Date: 05/21/2017 CLINICAL DATA:  83 year old male with ataxia. Hypertension and atrial fibrillation. Initial encounter. EXAM: CT HEAD WITHOUT CONTRAST TECHNIQUE: Contiguous axial images were obtained from the base of the skull through the vertex without intravenous contrast. COMPARISON:  12/05/2007. FINDINGS: Brain: No intracranial hemorrhage or CT evidence of large acute infarct. Remote superior right cerebellar infarct. Chronic microvascular changes. Atrophy. No intracranial mass lesion noted on this unenhanced exam. Vascular: Vascular calcifications. Skull: Negative Sinuses/Orbits: No acute orbital abnormality. Visualized paranasal sinuses are clear. Other: Mastoid air cells and middle ear cavities are clear. IMPRESSION: No intracranial hemorrhage or CT evidence of large acute infarct. Remote superior right cerebellar infarct. Chronic microvascular changes. Atrophy. Electronically Signed   By: Genia Del M.D.   On: 05/21/2017 07:03    Assessment & Plan    1. Severe Aortic Stenosis/ Severe Mitral Regurgitation - repeat echocardiogram this admission shows severe focal basal hypertrophy of the septum, severe aortic stenosis with Valve area (VTI): 0.49 cm^2. Valve area (Vmax): 0.47 cm^2. Valve area (Vmean): 0.53 cm^2, flail motion of the anterior leaflet of the mitral valve with severe mitral  stenosis and moderate to severe MR.  - while he has severe AS and severe MR, he denies any recent symptoms of dyspnea, lightheadedness, dizziness, or presyncope. Has noticed some lower extremity edema which  resolved after one dose of IV Lasix.  - Not a likely TAVR candidate given his concurrent mitral valve disease. If surgical options are pursued, will need a TEE initially then likely cardiac catheterization along with CT Surgery consult. As he is asymptomatic, perhaps this could be performed on an outpatient basis. Will discuss further with Wayne [name].   2. CAD - s/p PCI of OM in 2007.  - he denies any recent anginal changes. EKG shows no acute ischemic changes.  - continue BB therapy. Has declined statin therapy. No ASA secondary to the need for Coumadin.  3. Permanent atrial fibrillation - he denies any recent palpitations or dyspnea. Continue Digoxin and Lopressor for rate control.  - This patients CHA2DS2-VASc Score and unadjusted Ischemic Stroke Rate (% per year) is equal to 9.7 % stroke rate/year from a score of 6 (HTN, Vascular, Age (2), CVA (2). He denies any evidence of active bleeding. Remains on Coumadin for anticoagulation.   4. HTN  - BP has been well-controlled at 100/57 - 134/91 within the past 24 hours.  - continue current medication regimen.   5. History of Embolic CVA - no residual neurologic deficits. Repeat Head CT this admission shows no acute intracranial abnormalities.  - remains on Coumadin in the setting of atrial fibrillation.   6. Hyponatremia - Na+ 122 on admission, at 126 today. Continue to follow.   7. Urinary Retention - patient's presenting issue. Foley catheter currently in place. Has been continued on PTA Alfuzosin and Finasteride.  - consider Urology consult if unable to pass voiding trial.    For questions or updates, please contact [facility] Please consult [email or web address] for contact info under Cardiology/STEMI.  Signed, Erma Heritage, PA-C 05/21/2017, 12:18 PM Pager: [phone]   As above, patient seen and examined.  Briefly he is an 83 year old male with past medical history of coronary artery disease, permanent atrial  fibrillation, severe aortic stenosis, hypertension, hyperlipidemia, prior CVA admitted with urinary retention for evaluation of aortic stenosis and severe mitral regurgitation.  Patient was seen on December 2 in the emergency room with complaints of inability to urinate.  He then developed bilateral lower extremity edema and increased weight of 5-6 pounds.  He denies dyspnea, chest pain, palpitations or syncope.  He does describe fatigue.  No fevers or chills.  A Foley catheter was placed with improvement in his symptoms and edema.  An echocardiogram was ordered and showed normal LV systolic function, severe aortic stenosis with mean gradient 44 mmHg, severe mitral stenosis with mean gradient 11 mmHg and severe regurgitation due to flail leaflet.  There was severe biatrial enlargement and mild to moderate tricuspid regurgitation.  Cardiology asked to evaluate. Electrocardiogram shows atrial fibrillation with nonspecific ST changes.  1 volume overload-this appears to be predominantly related to urinary retention by history.  Patient could not void and then developed lower extremity edema and weight gain.  A Foley catheter was placed with resolution of his symptoms.  Would ask urology to evaluate further.  2 aortic stenosis-patient has severe aortic stenosis and also severe mitral regurgitation/severe mitral stenosis.  However prior to urinary retention he was not complaining of dyspnea on exertion, chest pain or syncope.  I have explained that he will likely need consideration of valve replacement in the future.  However given that he is asymptomatic would not pursue at this point.  This will be a very complicated decision as he would potentially require aortic and mitral valve replacements and possible coronary artery bypass and graft.  Alternatively we could consider TAVR for symptomatic relief with close follow-up of mitral regurgitation.  Regardless we will have further discussions in the office.  3 mitral  regurgitation-as above.  4 permanent atrial fibrillation-continue digoxin and metoprolol for rate control.  Continue Coumadin.  5 hypertension-blood pressure is controlled.  Continue present medications.  6 hyponatremia-improving.  Follow.  Kirk Ruths, MD

## 2017-05-21 NOTE — Plan of Care (Signed)
  Education: Knowledge of General Education information will improve 05/21/2017 0456 - Completed/Met by Evert Kohl, RN

## 2017-05-21 NOTE — Progress Notes (Signed)
  Echocardiogram 2D Echocardiogram has been performed.  Darlina Sicilian M 05/21/2017, 9:24 AM

## 2017-05-21 NOTE — Progress Notes (Signed)
ANTICOAGULATION CONSULT NOTE - Initial Consult  Pharmacy Consult for Coumadin Indication: atrial fibrillation  Allergies  Allergen Reactions  . Prednisone Other (See Comments)    Wheezing  . Procaine Hcl Other (See Comments)    Passed out after being given this at dental appointment  . Levofloxacin Rash  . Oxycodone     constipation  . Penicillins Rash    Has patient had a PCN reaction causing immediate rash, facial/tongue/throat swelling, SOB or lightheadedness with hypotension: Yes Has patient had a PCN reaction causing severe rash involving mucus membranes or skin necrosis: No Has patient had a PCN reaction that required hospitalization No Has patient had a PCN reaction occurring within the last 10 years: No If all of the above answers are "NO", then may proceed with Cephalosporin use.  . Tylenol [Acetaminophen] Other (See Comments)    Makes him very sleepy if he takes more than one tablet    Patient Measurements: Height: 5' 10.5" (179.1 cm) Weight: 171 lb 11.2 oz (77.9 kg)(c scake) IBW/kg (Calculated) : 74.15  Vital Signs: Temp: 99.1 F (37.3 C) (12/02 2000) Temp Source: Oral (12/02 2000) BP: 122/65 (12/02 2000) Pulse Rate: 79 (12/02 2000)  Labs: Recent Labs    05/20/17 1416  HGB 10.7*  HCT 31.4*  PLT 174  LABPROT 29.2*  INR 2.79  CREATININE 0.68  TROPONINI <0.03    Estimated Creatinine Clearance: 70.9 mL/min (by C-G formula based on SCr of 0.68 mg/dL).   Medical History: Past Medical History:  Diagnosis Date  . Aortic stenosis   . Arthritis   . Atrial fibrillation (HCC)    Chronic  . BPH (benign prostatic hyperplasia)   . CAD (coronary artery disease)   . Chronic anticoagulation   . History of chicken pox   . HTN (hypertension)   . Mitral stenosis   . Old MI (myocardial infarction) 2007   s/p PCI to distal OM (no stent)  . Stroke, embolic [facility])     Medications:  Medications Prior to Admission  Medication Sig Dispense Refill Last Dose  .  Acetaminophen (TYLENOL 8 HOUR PO) Take as needed by mouth.     Marland Kitchen alfuzosin (UROXATRAL) 10 MG 24 hr tablet Take 10 mg by mouth daily with breakfast.   04/08/2017 at 0800  . Cholecalciferol (VITAMIN D3) 2000 units TABS Take 2,000 Units by mouth daily.   Taking  . digoxin (LANOXIN) 0.125 MG tablet TAKE ONE TABLET BY MOUTH ONCE DAILY 90 tablet 3 04/08/2017 at 0800  . finasteride (PROSCAR) 5 MG tablet Take 1 tablet (5 mg total) by mouth daily.   04/08/2017 at 2000  . methocarbamol (ROBAXIN) 500 MG tablet Take 500 mg by mouth every 6 (six) hours.   04/08/2017  . metoprolol tartrate (LOPRESSOR) 25 MG tablet TAKE ONE TABLET BY MOUTH IN THE MORNING, THEN TAKE ONE-HALF IN THE EVENING 135 tablet 3 04/08/2017 at 0800  . triamcinolone (KENALOG) 0.1 % cream Apply 1 application topically 2 (two) times daily as needed (itching). As directed by P.A. Lennie Odor with Dr. Allyson Sabal    Taking  . warfarin (COUMADIN) 2.5 MG tablet TAKE 1 TO 2 TABLETS BY MOUTH ONCE DAILY AS DIRECTED BY  COUMADIN  CLINIC 135 tablet 0    Scheduled:  . alfuzosin  10 mg Oral Q breakfast  . cholecalciferol  2,000 Units Oral Daily  . digoxin  125 mcg Oral Daily  . finasteride  5 mg Oral Daily  . methocarbamol  500 mg Oral Q6H  .  metoprolol tartrate  12.5 mg Oral QHS  . metoprolol tartrate  25 mg Oral q morning - 10a  . sodium chloride flush  3 mL Intravenous Q12H   Infusions:  . sodium chloride      Assessment: 83yo male admitted for hyponatremia, to continue Coumadin for Afib; current INR at goal, last dose taken 12/1.  Goal of Therapy:  INR 2-3   Plan:  Will continue home Coumadin dose of 5mg  MWTFSun and 2.5mg  TueSat and monitor INR.  Wynona Neat, PharmD, BCPS  05/21/2017,12:11 AM

## 2017-05-21 NOTE — Progress Notes (Signed)
[facility] PROGRESS NOTE  Aahil Fredin ZOX:[id number] DOB: 05-16-1930 DOA: 05/20/2017  PCP: [name], DO  Brief History/Interval Summary: 83 year old Caucasian male with a past medical history of stroke with residual weakness, chronic atrial fibrillation, severe aortic stenosis, moderate mitral regurgitation presented with complaints of weakness and difficulty urinating.  He was found to have urinary retention and a Foley catheter had to be placed.  He also mentioned weight gain and lower extremity edema.  He was found to have hyponatremia.  He was hospitalized for further management.  Reason for Visit: Hyponatremia.  Possible fluid overload.  Consultants: Cardiology  Procedures:  Transthoracic echocardiogram Study Conclusions  - Left ventricle: The cavity size was normal. There was severe   focal basal hypertrophy of the septum with otherwise mild   concentric hypertrophy. Systolic function was normal. Wall motion   was normal; there were no regional wall motion abnormalities. - Aortic valve: Valve mobility was restricted. There was severe   stenosis. There was no regurgitation. Valve area (VTI): 0.49   cm^2. Valve area (Vmax): 0.47 cm^2. Valve area (Vmean): 0.53   cm^2. - Mitral valve: Severely calcified annulus. Calcification.   Calcification. Mobility was restricted. Flail motion involving   the anterior leaflet. The findings are consistent with severe   stenosis. There was severe regurgitation directed eccentrically   and posteriorly. Valve area by pressure half-time: 1.63 cm^2.   Valve area by continuity equation (using LVOT flow): 0.98 cm^2. - Left atrium: The atrium was severely dilated. - Right ventricle: The cavity size was normal. Wall thickness was   normal. Systolic function was normal. - Right atrium: The atrium was severely dilated. - Tricuspid valve: There was mild-moderate regurgitation. - Pulmonary arteries: Systolic pressure was mildly  increased. PA   peak pressure: 43 mm Hg (S).  Impressions:  - Mitral regurgitation appears moderate visually but is likely   severe due to the flail leaflet with eccentric regurgitation.  Antibiotics: None  Subjective/Interval History: Patient states that he is feeling well this morning.  His wife is at the bedside.  Patient denies any chest pain.  No difficulty breathing.  He is lying flat on the bed.  Denies any nausea vomiting.  Leg swelling has improved.  ROS: Denies any headaches.  Objective:  Vital Signs  Vitals:   05/21/17 0023 05/21/17 0635 05/21/17 0639 05/21/17 0815  BP: 123/69 134/79  (!) 116/57  Pulse: 100 (!) 110  85  Resp: 20   18  Temp: 100 F (37.8 C) 98.9 F (37.2 C)  98.2 F (36.8 C)  TempSrc: Oral Oral  Oral  SpO2: 92% (!) 89% 96% 95%  Weight:  76.9 kg (169 lb 9.6 oz)    Height:        Intake/Output Summary (Last 24 hours) at 05/21/2017 1127 Last data filed at 05/21/2017 8119 Gross per 24 hour  Intake 236 ml  Output 4075 ml  Net -3839 ml   Filed Weights   05/20/17 1348 05/20/17 2000 05/21/17 0635  Weight: 81.2 kg (179 lb) 77.9 kg (171 lb 11.2 oz) 76.9 kg (169 lb 9.6 oz)    General appearance: alert, cooperative, appears stated age and no distress Head: Normocephalic, without obvious abnormality, atraumatic Resp: Diminished aeration at the bases.  No definite crackles.  No pain.  No rhonchi. Cardio: S1-S2 is normal regular.  No S3-S4.  Loud systolic murmur appreciated over the precordium.  Pedal edema is appreciated. GI: soft, non-tender; bowel sounds normal; no masses,  no organomegaly  Extremities: edema 1+ Neurologic: Awake alert.  No obvious focal neurological deficits.  Lab Results:  Data Reviewed: I have personally reviewed following labs and imaging studies  CBC: Recent Labs  Lab 05/20/17 1416 05/21/17 0528  WBC 8.8 9.7  NEUTROABS 7.2  --   HGB 10.7* 11.6*  HCT 31.4* 34.7*  MCV 83.3 83.0  PLT 174 202    Basic Metabolic  Panel: Recent Labs  Lab 05/20/17 1416 05/21/17 0528  NA 122* 126*  K 4.0 3.8  CL 92* 92*  CO2 23 23  GLUCOSE 127* 98  BUN 14 10  CREATININE 0.68 0.74  CALCIUM 8.0* 8.1*    GFR: Estimated Creatinine Clearance: 70.9 mL/min (by C-G formula based on SCr of 0.74 mg/dL).  Liver Function Tests: Recent Labs  Lab 05/20/17 1416 05/21/17 0528  AST 23 24  ALT 17 19  ALKPHOS 46 48  BILITOT 1.1 1.3*  PROT 6.3* 6.1*  ALBUMIN 3.1* 2.9*    Coagulation Profile: Recent Labs  Lab 05/20/17 1416 05/21/17 0528  INR 2.79 2.33    Cardiac Enzymes: Recent Labs  Lab 05/20/17 1416 05/20/17 2355 05/21/17 0528  TROPONINI <0.03 <0.03 <0.03   Thyroid Function Tests: Recent Labs    05/20/17 2355  TSH 2.425     Radiology Studies: Dg Chest 2 View  Result Date: 05/20/2017 CLINICAL DATA:  Shortness of breath. EXAM: CHEST  2 VIEW COMPARISON:  07/02/2016 FINDINGS: Top-normal heart size. Suggestion of mild interstitial edema. Bibasilar atelectasis versus infiltrates. No significant pleural effusions. IMPRESSION: Suggestion of mild interstitial pulmonary edema. Bibasilar atelectasis versus infiltrates. Electronically Signed   By: Aletta Edouard M.D.   On: 05/20/2017 15:15   Dg Lumbar Spine 2-3 Views  Result Date: 05/20/2017 CLINICAL DATA:  Chronic low back pain. EXAM: LUMBAR SPINE - 2-3 VIEW COMPARISON:  04/08/2017 FINDINGS: Three views study shows stable appearance superior endplate compression deformity at L3. Loss of disc height L2-3 also stable. No new fracture evident atherosclerotic calcification noted abdominal aorta. SI joints are unremarkable. IMPRESSION: 1. Superior endplate compression deformity at L3 is stable. 2. No new interval finding. Electronically Signed   By: Misty Stanley M.D.   On: 05/20/2017 15:12   Ct Head Wo Contrast  Result Date: 05/21/2017 CLINICAL DATA:  83 year old male with ataxia. Hypertension and atrial fibrillation. Initial encounter. EXAM: CT HEAD WITHOUT  CONTRAST TECHNIQUE: Contiguous axial images were obtained from the base of the skull through the vertex without intravenous contrast. COMPARISON:  12/05/2007. FINDINGS: Brain: No intracranial hemorrhage or CT evidence of large acute infarct. Remote superior right cerebellar infarct. Chronic microvascular changes. Atrophy. No intracranial mass lesion noted on this unenhanced exam. Vascular: Vascular calcifications. Skull: Negative Sinuses/Orbits: No acute orbital abnormality. Visualized paranasal sinuses are clear. Other: Mastoid air cells and middle ear cavities are clear. IMPRESSION: No intracranial hemorrhage or CT evidence of large acute infarct. Remote superior right cerebellar infarct. Chronic microvascular changes. Atrophy. Electronically Signed   By: Genia Del M.D.   On: 05/21/2017 07:03     Medications:  Scheduled: . alfuzosin  10 mg Oral Q breakfast  . cholecalciferol  2,000 Units Oral Daily  . digoxin  125 mcg Oral Daily  . finasteride  5 mg Oral Daily  . methocarbamol  500 mg Oral Q6H  . metoprolol tartrate  12.5 mg Oral QHS  . metoprolol tartrate  25 mg Oral q morning - 10a  . sodium chloride flush  3 mL Intravenous Q12H  . [START ON 05/22/2017] warfarin  2.5  mg Oral Once per day on Tue Sat  . warfarin  5 mg Oral Once per day on Sun Mon Wed Thu Fri  . Warfarin - Pharmacist Dosing Inpatient   Does not apply q1800   Continuous: . sodium chloride     LNL:GXQJJH chloride, acetaminophen **OR** acetaminophen, polyethylene glycol, sodium chloride flush  Assessment/Plan:  Principal Problem:   Hyponatremia Active Problems:   Chronic atrial fibrillation (HCC)   Acute on chronic diastolic CHF (congestive heart failure) (HCC)   Aortic stenosis   Urinary retention    Hyponatremia Considering pedal edema and evidence for vascular congestion on chest x-ray this is most likely hypervolemic in etiology.  Patient was given Lasix x1 last night.  Sodium level has improved to 126.     Acute on chronic diastolic CHF Patient noted to have lower extremity edema/weight gain/abnormal chest x-ray.  Patient was given Lasix last night with a brisk diuresis.  His weight has decreased.  He would likely benefit from more Lasix but we will first wait for cardiology input.  Echocardiogram report as above.  Systolic function is normal.  Severe aortic stenosis and moderate to severe mitral regurgitation Management per cardiology.  History of chronic atrial fibrillation Stable.  Heart rate mildly elevated.  Continue home medications including digoxin and metoprolol.  He is anticoagulated with warfarin which will be continued per pharmacy.  Urinary retention in a patient with known history of BPH Foley catheter had to be placed for urinary retention.  Continue for now.  Could attempt voiding trial if he medically improves in the next couple of days or could be seen at his urologist's office (?Dr. Karsten Ro).  Continue finasteride and Uroxatral.  Generalized weakness/fatigue Most likely due to all of the above issues.  PT and OT evaluation.  UTI did not show any acute findings.  Previous history of stroke Stable.  Normocytic anemia Probably due to chronic kidney disease.  No evidence of overt bleeding.  Continue to monitor.  DVT Prophylaxis: Patient is on warfarin    Code Status: Full code Family Communication: Discussed with the patient and his wife Disposition Plan: Management as outlined above.    LOS: 1 day   [facility] Pager [phone] 05/21/2017, 11:27 AM  If 7PM-7AM, please contact night-coverage at [email or web address], password [facility]

## 2017-05-22 LAB — BASIC METABOLIC PANEL
Anion gap: 9 (ref 5–15)
BUN: 9 mg/dL (ref 6–20)
CO2: 25 mmol/L (ref 22–32)
CREATININE: 0.65 mg/dL (ref 0.61–1.24)
Calcium: 8.1 mg/dL — ABNORMAL LOW (ref 8.9–10.3)
Chloride: 91 mmol/L — ABNORMAL LOW (ref 101–111)
GFR calc Af Amer: >60 mL/min (ref >60)
GFR calc non Af Amer: >60 mL/min (ref >60)
GLUCOSE: 87 mg/dL (ref 65–99)
Potassium: 4 mmol/L (ref 3.5–5.1)
SODIUM: 125 mmol/L — AB (ref 135–145)

## 2017-05-22 LAB — CBC
HCT: 33.6 % — ABNORMAL LOW (ref 39.0–52.0)
Hemoglobin: 11.4 g/dL — ABNORMAL LOW (ref 13.0–17.0)
MCH: 28.2 pg (ref 26.0–34.0)
MCHC: 33.9 g/dL (ref 30.0–36.0)
MCV: 83.2 fL (ref 78.0–100.0)
PLATELETS: 198 10*3/uL (ref 150–400)
RBC: 4.04 MIL/uL — ABNORMAL LOW (ref 4.22–5.81)
RDW: 15.6 % — AB (ref 11.5–15.5)
WBC: 8.6 10*3/uL (ref 4.0–10.5)

## 2017-05-22 LAB — PROTIME-INR
INR: 2.59
PROTHROMBIN TIME: 27.6 s — AB (ref 11.4–15.2)

## 2017-05-22 MED ORDER — POLYETHYLENE GLYCOL 3350 17 G PO PACK
17.0000 g | PACK | Freq: Every day | ORAL | Status: DC
  Administered 2017-05-22 – 2017-05-23 (×2): 17 g via ORAL
  Filled 2017-05-22 (×2): qty 1

## 2017-05-22 MED ORDER — METHOCARBAMOL 500 MG PO TABS: 500.0000 mg | ORAL_TABLET | Freq: Four times a day (QID) | ORAL | Status: DC | PRN

## 2017-05-22 MED ORDER — FUROSEMIDE 10 MG/ML IJ SOLN
20.0000 mg | Freq: Once | INTRAMUSCULAR | Status: AC
  Administered 2017-05-22: 20 mg via INTRAVENOUS
  Filled 2017-05-22: qty 2

## 2017-05-22 NOTE — Evaluation (Signed)
Physical Therapy Evaluation Patient Details Name: Wayne [name] MRN: [id number] DOB: [birth year] Today's Date: 05/22/2017   History of Present Illness  Pt is an 83 y.o. male who presented with weakness and difficulty urinating. Found to have urinary retention requiring Foley catheter placement and hyponatremia. PMH significant for stroke with residual weakness, chronic atrial fibrillation, severe aortic stenosis, and moderate mitral regurgitation.    Clinical Impression  Pt presents with an overall decrease in functional mobility secondary to above. PTA, pt mod indep with intermittent use of RW for household ambulation; lives with wife who pt reports does not have good mobility. Has family available for 24/7 supervision. Today, pt received brushing teeth at sink with OT. Able to transfer and ambulate with RW and supervision for safety. Pt very motivated to participate with HHPT services in order to maximize functional mobility and address balance deficits. Pt would benefit from continued acute PT services to maximize functional mobility and independence prior to d/c with HHPT.     Follow Up Recommendations Home health PT;Supervision - Intermittent    Equipment Recommendations  None recommended by PT    Recommendations for Other Services       Precautions / Restrictions Precautions Precautions: Fall Restrictions Weight Bearing Restrictions: No      Mobility  Bed Mobility Overal bed mobility: Needs Assistance Bed Mobility: Supine to Sit     Supine to sit: Supervision     General bed mobility comments: Received standing at sink with OT  Transfers Overall transfer level: Needs assistance Equipment used: Rolling walker (2 wheeled) Transfers: Sit to/from Stand Sit to Stand: Supervision         General transfer comment: Supervision for safety with sitting; good technique and hand placement with RW  Ambulation/Gait Ambulation/Gait assistance: Supervision Ambulation  Distance (Feet): 100 Feet Assistive device: Rolling walker (2 wheeled) Gait Pattern/deviations: Step-through pattern;Decreased stride length;Trunk flexed Gait velocity: Decreased Gait velocity interpretation: <1.8 ft/sec, indicative of risk for recurrent falls General Gait Details: Slow, controlled gait with RW and supervision for safety. Further distance limited by c/o fatigue  Stairs            Wheelchair Mobility    Modified Rankin (Stroke Patients Only)       Balance Overall balance assessment: Needs assistance Sitting-balance support: No upper extremity supported;Feet supported Sitting balance-Leahy Scale: Fair     Standing balance support: Bilateral upper extremity supported;No upper extremity supported;During functional activity Standing balance-Leahy Scale: Poor Standing balance comment: Requires min guard assist without UE support                              Pertinent Vitals/Pain Pain Assessment: No/denies pain    Home Living Family/patient expects to be discharged to:: Private residence Living Arrangements: Spouse/significant other Available Help at Discharge: Family;Available 24 hours/day(wife with limited mobility) Type of Home: House Home Access: Stairs to enter Entrance Stairs-Rails: None Entrance Stairs-Number of Steps: "a couple" Home Layout: Multi-level;Bed/bath upstairs Home Equipment: Walker - 2 wheels      Prior Function Level of Independence: Independent with assistive device(s)         Comments: Reports using RW in the upstairs portion of home and furniture walking on the main level. Enjoys walking and walks with cart at stores nearly daily.      Hand Dominance        Extremity/Trunk Assessment   Upper Extremity Assessment Upper Extremity Assessment: Generalized weakness    Lower  Extremity Assessment Lower Extremity Assessment: Generalized weakness       Communication   Communication: No difficulties   Cognition Arousal/Alertness: Awake/alert Behavior During Therapy: WFL for tasks assessed/performed Overall Cognitive Status: Within Functional Limits for tasks assessed                                 General Comments: Slow to respond at times due to difficulty hearing       General Comments General comments (skin integrity, edema, etc.): SpO2 remained >93% on RA throughout session (RN aware and room O2 [place] left off post-treatment)    Exercises     Assessment/Plan    PT Assessment Patient needs continued PT services  PT Problem List Decreased strength;Decreased activity tolerance;Decreased balance;Decreased mobility       PT Treatment Interventions DME instruction;Gait training;Stair training;Functional mobility training;Therapeutic activities;Therapeutic exercise;Balance training;Patient/family education    PT Goals (Current goals can be found in the Care Plan section)  Acute Rehab PT Goals Patient Stated Goal: Get therapy at home PT Goal Formulation: With patient Time For Goal Achievement: 06/05/17 Potential to Achieve Goals: Good    Frequency Min 3X/week   Barriers to discharge        Co-evaluation               AM-PAC PT "6 Clicks" Daily Activity  Outcome Measure Difficulty turning over in bed (including adjusting bedclothes, sheets and blankets)?: None Difficulty moving from lying on back to sitting on the side of the bed? : None Difficulty sitting down on and standing up from a chair with arms (e.g., wheelchair, bedside commode, etc,.)?: A Little Help needed moving to and from a bed to chair (including a wheelchair)?: A Little Help needed walking in hospital room?: A Little Help needed climbing 3-5 steps with a railing? : A Little 6 Click Score: 20    End of Session Equipment Utilized During Treatment: Gait belt Activity Tolerance: Patient tolerated treatment well Patient left: in chair;with call bell/phone within reach;with chair alarm  set Nurse Communication: Mobility status PT Visit Diagnosis: Other abnormalities of gait and mobility (R26.89);Unsteadiness on feet (R26.81)    Time: 6568-1275 PT Time Calculation (min) (ACUTE ONLY): 17 min   Charges:   PT Evaluation $PT Eval Moderate Complexity: 1 Mod     PT G Codes:       Mabeline Caras, PT, DPT Acute Rehab Services  Pager: [place] 05/22/2017, 12:13 PM

## 2017-05-22 NOTE — Consult Note (Signed)
I have been asked to see the patient by Dr. Bonnielee Haff, for evaluation and management of urinary retention.  History of present illness: 83 year old male with a past medical history significant for BPH and obstructive voiding symptoms who presented to the emergency department yesterday after 3 days of worsening urinary frequency.  Ultimately, the patient developed urinary retention.  In the emergency department a Foley catheter was placed.  He also was noted to be hyponatremic and there was some concern for heart failure.  The patient has been started on finasteride and alfuzosin for his BPH.  He has had several issues with urinary retention and obstructive voiding symptoms.  He has declined most of his treatment aside from the medications.  He is complaining of some low back pain which seems to have been slightly improved after the Foley catheter was placed.  He denies any fevers or chills.  He was not having any associated dysuria.  Review of systems: A 12 point comprehensive review of systems was obtained and is negative unless otherwise stated in the history of present illness.  Patient Active Problem List   Diagnosis Date Noted  . Acute on chronic diastolic CHF (congestive heart failure) ([place]) 05/20/2017  . Hyponatremia 05/20/2017  . Aortic stenosis 05/20/2017  . Urinary retention   . Arthritis of facet joints at multiple vertebral levels ([place]) 04/26/2017  . Spinal stenosis at L4-L5 level 04/26/2017  . Lumbar radiculopathy, chronic 04/26/2017  . Benign colonic polyp- 25 yrs ago.  11/16/2016  . Vitamin D deficiency 11/16/2016  . H/O non anemic vitamin B12 deficiency 11/16/2016  . Mild Colonic dysmotility 11/16/2016  . Hypokalemia 09/13/2016  . BPH with obstruction/lower urinary tract symptoms 09/13/2016  . Urinary frequency 07/10/2016  . Left bundle branch block (LBBB) on electrocardiogram 06/30/2016  . History of stroke 06/30/2016  . Generalized weakness 06/30/2016  .  Osteoarthritis 04/04/2016  . Encounter for therapeutic drug monitoring 08/06/2013  . Chronic anticoagulation   . HTN (hypertension)   . CAD (coronary artery disease)   . Chronic atrial fibrillation ([place]) 09/13/2010  . h/o CVA (cerebrovascular accident due to intracerebral hemorrhage) 09/13/2010    No current facility-administered medications on file prior to encounter.    Current Outpatient Medications on File Prior to Encounter  Medication Sig Dispense Refill  . alfuzosin (UROXATRAL) 10 MG 24 hr tablet Take 10 mg by mouth daily with breakfast.    . digoxin (LANOXIN) 0.125 MG tablet TAKE ONE TABLET BY MOUTH ONCE DAILY (Patient taking differently: TAKE ONE TABLET (161mcg) BY MOUTH ONCE DAILY) 90 tablet 3  . finasteride (PROSCAR) 5 MG tablet Take 1 tablet (5 mg total) by mouth daily.    . methocarbamol (ROBAXIN) 500 MG tablet Take 500 mg by mouth every 6 (six) hours as needed (cramps).     . metoprolol tartrate (LOPRESSOR) 25 MG tablet TAKE ONE TABLET BY MOUTH IN THE MORNING, THEN TAKE ONE-HALF IN THE EVENING (Patient taking differently: TAKE ONE TABLET (25mg ) BY MOUTH IN THE MORNING, THEN TAKE ONE-HALF (12.5mg ) IN THE EVENING) 135 tablet 3  . polyethylene glycol (MIRALAX / GLYCOLAX) packet Take 17 g by mouth daily.    Marland Kitchen warfarin (COUMADIN) 2.5 MG tablet TAKE 1 TO 2 TABLETS BY MOUTH ONCE DAILY AS DIRECTED BY  COUMADIN  CLINIC (Patient taking differently: Take 2.5-5 mg by mouth See admin instructions. 5mg  on S M [facility] F and 2.5mg  on T SAT) 135 tablet 0    Past Medical History:  Diagnosis Date  .  Aortic stenosis   . Arthritis   . Atrial fibrillation (HCC)    Chronic  . BPH (benign prostatic hyperplasia)   . CAD (coronary artery disease)   . Chronic anticoagulation   . History of chicken pox   . HTN (hypertension)   . Mitral stenosis   . Old MI (myocardial infarction) 2007   s/p PCI to distal OM (no stent)  . Stroke, embolic [facility])     Past Surgical History:  Procedure Laterality  Date  . CORONARY ANGIOPLASTY  2007   Distal OM  . PROSTATE BIOPSY      Social History   Tobacco Use  . Smoking status: Never Smoker  . Smokeless tobacco: Never Used  Substance Use Topics  . Alcohol use: No  . Drug use: No    Family History  Problem Relation Age of Onset  . Heart disease Mother   . Heart attack Mother   . Heart disease Father   . Heart attack Father   . Heart attack Brother   . Heart attack Sister   . Stroke Brother     PE: Vitals:   05/22/17 0604 05/22/17 1157 05/22/17 1628 05/22/17 2054  BP: 132/67 109/60 97/60 (!) 95/44  Pulse: (!) 105 87 76 76  Resp: 18     Temp: 97.6 F (36.4 C)   (!) 97.5 F (36.4 C)  TempSrc: Oral   Oral  SpO2: 97% 97% 93% 94%  Weight: 76.2 kg (168 lb)     Height:       Patient appears to be in no acute distress  patient is alert and oriented x3 Atraumatic normocephalic head No cervical or supraclavicular lymphadenopathy appreciated No increased work of breathing, no audible wheezes/rhonchi Regular sinus rhythm/rate Abdomen is soft, nontender, nondistended, no CVA or suprapubic tenderness The Foley catheter is draining clear yellow urine Lower extremities are symmetric without appreciable edema Grossly neurologically intact No identifiable skin lesions  Recent Labs    05/20/17 1416 05/21/17 0528 05/22/17 0543  WBC 8.8 9.7 8.6  HGB 10.7* 11.6* 11.4*  HCT 31.4* 34.7* 33.6*   Recent Labs    05/20/17 1416 05/21/17 0528 05/22/17 0543  NA 122* 126* 125*  K 4.0 3.8 4.0  CL 92* 92* 91*  CO2 23 23 25   GLUCOSE 127* 98 87  BUN 14 10 9   CREATININE 0.68 0.74 0.65  CALCIUM 8.0* 8.1* 8.1*   Recent Labs    05/20/17 1416 05/21/17 0528 05/22/17 0543  INR 2.79 2.33 2.59   No results for input(s): LABURIN in the last 72 hours. Results for orders placed or performed during the hospital encounter of 10/04/16  Urine culture     Status: Abnormal   Collection Time: 10/04/16  1:22 AM  Result Value Ref Range Status    Specimen Description URINE, RANDOM  Final   Special Requests NONE  Final   Culture (A)  Final    >=100,000 COLONIES/mL ENTEROCOCCUS FAECALIS 80,000 COLONIES/mL STAPHYLOCOCCUS SPECIES (COAGULASE NEGATIVE)    Report Status 10/07/2016 FINAL  Final   Organism ID, Bacteria ENTEROCOCCUS FAECALIS (A)  Final   Organism ID, Bacteria STAPHYLOCOCCUS SPECIES (COAGULASE NEGATIVE) (A)  Final      Susceptibility   Enterococcus faecalis - MIC*    AMPICILLIN <=2 SENSITIVE Sensitive     LEVOFLOXACIN 1 SENSITIVE Sensitive     NITROFURANTOIN <=16 SENSITIVE Sensitive     VANCOMYCIN 1 SENSITIVE Sensitive     * >=100,000 COLONIES/mL ENTEROCOCCUS FAECALIS   Staphylococcus species (coagulase  negative) - MIC*    CIPROFLOXACIN 4 RESISTANT Resistant     GENTAMICIN <=0.5 SENSITIVE Sensitive     NITROFURANTOIN <=16 SENSITIVE Sensitive     OXACILLIN >=4 RESISTANT Resistant     TETRACYCLINE <=1 SENSITIVE Sensitive     VANCOMYCIN 2 SENSITIVE Sensitive     TRIMETH/SULFA 40 SENSITIVE Sensitive     CLINDAMYCIN RESISTANT Resistant     RIFAMPIN <=0.5 SENSITIVE Sensitive     Inducible Clindamycin POSITIVE Resistant     * 80,000 COLONIES/mL STAPHYLOCOCCUS SPECIES (COAGULASE NEGATIVE)   I reviewed all the patient's documents from the clinic as well as his urine cultures and laboratory values.  Imaging: none  Imp: 84 year old male with a history of BPH and urinary retention on maximal medical therapy presented to the emergency department with the inability to urinate.  A Foley catheter was placed in approximately 300 cc of urine was obtained.  He was subsequently given Lasix and a significant more amount was documented.  Prior to the emergency department the patient was unable to void for several hours and complaining of suprapubic pain.  He was also noted to be hyponatremic.  Recommendations: I would recommend that the patient continue with finasteride and alfuzosin.  He will need to keep his Foley catheter for at least  1 week.  We will plan to schedule him for a voiding trial and follow-up in our office at that time.  I explained the rationale for an indwelling Foley to the patient and his family.  I answered all their questions.   Thank you for involving me in this patient's care, Please page with any further questions or concerns. Louis Meckel W

## 2017-05-22 NOTE — Progress Notes (Signed)
Left message with daughter Eustaquio Maize regarding follow up for urology consult while patient is in-patient. Patient wife provides name of Dr. Loel Lofty. Message sent to Dr. [place] Pink for urology consult.

## 2017-05-22 NOTE — Progress Notes (Signed)
Progress Note  Patient Name: Wayne [name] Date of Encounter: 05/22/2017  Primary Cardiologist: Dr Stanford Breed  Subjective   No dyspnea or chest pain  Inpatient Medications    Scheduled Meds: . alfuzosin  10 mg Oral Q breakfast  . cholecalciferol  2,000 Units Oral Daily  . digoxin  125 mcg Oral Daily  . finasteride  5 mg Oral Daily  . metoprolol tartrate  12.5 mg Oral QHS  . metoprolol tartrate  25 mg Oral q morning - 10a  . polyethylene glycol  17 g Oral Daily  . sodium chloride flush  3 mL Intravenous Q12H  . warfarin  2.5 mg Oral Once Wayne day on Tue Sat  . warfarin  5 mg Oral Once Wayne day on Sun Mon Wed Thu Fri  . Warfarin - Pharmacist Dosing Inpatient   Does not apply q1800   Continuous Infusions: . sodium chloride     PRN Meds: sodium chloride, acetaminophen **OR** acetaminophen, methocarbamol, sodium chloride flush   Vital Signs    Vitals:   05/21/17 1531 05/21/17 2100 05/22/17 0003 05/22/17 0604  BP: (!) 100/45 108/63 117/67 132/67  Pulse: 95 96 92 (!) 105  Resp: 18 18 18 18   Temp: 98.3 F (36.8 C) 98.9 F (37.2 C) 98.8 F (37.1 C) 97.6 F (36.4 C)  TempSrc: Oral Oral Oral Oral  SpO2: 94% 96% 97% 97%  Weight:    168 lb (76.2 kg)  Height:        Intake/Output Summary (Last 24 hours) at 05/22/2017 1157 Last data filed at 05/22/2017 0945 Gross Wayne 24 hour  Intake 880 ml  Output 1201 ml  Net -321 ml   Filed Weights   05/20/17 2000 05/21/17 0635 05/22/17 0604  Weight: 171 lb 11.2 oz (77.9 kg) 169 lb 9.6 oz (76.9 kg) 168 lb (76.2 kg)    Telemetry    Atrial fibrillation rate controlled - Personally Reviewed   Physical Exam   GEN: WD/WN No acute distress.   Neck: No JVD, supple Cardiac: irregular, 3/6 systolic murmur Respiratory: Clear to auscultation bilaterally; no wheeze GI: Soft, nontender, non-distended, no masses MS: Trace edema Neuro:  Grossly intact   Labs    Chemistry Recent Labs  Lab 05/20/17 1416 05/21/17 0528  05/22/17 0543  NA 122* 126* 125*  K 4.0 3.8 4.0  CL 92* 92* 91*  CO2 23 23 25   GLUCOSE 127* 98 87  BUN 14 10 9   CREATININE 0.68 0.74 0.65  CALCIUM 8.0* 8.1* 8.1*  PROT 6.3* 6.1*  --   ALBUMIN 3.1* 2.9*  --   AST 23 24  --   ALT 17 19  --   ALKPHOS 46 48  --   BILITOT 1.1 1.3*  --   GFRNONAA >60 >60 >60  GFRAA >60 >60 >60  ANIONGAP 7 11 9      Hematology Recent Labs  Lab 05/20/17 1416 05/21/17 0528 05/22/17 0543  WBC 8.8 9.7 8.6  RBC 3.77* 4.18* 4.04*  HGB 10.7* 11.6* 11.4*  HCT 31.4* 34.7* 33.6*  MCV 83.3 83.0 83.2  MCH 28.4 27.8 28.2  MCHC 34.1 33.4 33.9  RDW 15.6* 15.7* 15.6*  PLT 174 184 198    Cardiac Enzymes Recent Labs  Lab 05/20/17 1416 05/20/17 2355 05/21/17 0528 05/21/17 1109  TROPONINI <0.03 <0.03 <0.03 <0.03    BNP Recent Labs  Lab 05/20/17 1416  BNP 284.4*      Radiology    Dg Chest 2 View  Result Date:  05/20/2017 CLINICAL DATA:  Shortness of breath. EXAM: CHEST  2 VIEW COMPARISON:  07/02/2016 FINDINGS: Top-normal heart size. Suggestion of mild interstitial edema. Bibasilar atelectasis versus infiltrates. No significant pleural effusions. IMPRESSION: Suggestion of mild interstitial pulmonary edema. Bibasilar atelectasis versus infiltrates. Electronically Signed   By: Aletta Edouard M.D.   On: 05/20/2017 15:15   Dg Lumbar Spine 2-3 Views  Result Date: 05/20/2017 CLINICAL DATA:  Chronic low back pain. EXAM: LUMBAR SPINE - 2-3 VIEW COMPARISON:  04/08/2017 FINDINGS: Three views study shows stable appearance superior endplate compression deformity at L3. Loss of disc height L2-3 also stable. No new fracture evident atherosclerotic calcification noted abdominal aorta. SI joints are unremarkable. IMPRESSION: 1. Superior endplate compression deformity at L3 is stable. 2. No new interval finding. Electronically Signed   By: Misty Stanley M.D.   On: 05/20/2017 15:12   Ct Head Wo Contrast  Result Date: 05/21/2017 CLINICAL DATA:  83 year old male  with ataxia. Hypertension and atrial fibrillation. Initial encounter. EXAM: CT HEAD WITHOUT CONTRAST TECHNIQUE: Contiguous axial images were obtained from the base of the skull through the vertex without intravenous contrast. COMPARISON:  12/05/2007. FINDINGS: Brain: No intracranial hemorrhage or CT evidence of large acute infarct. Remote superior right cerebellar infarct. Chronic microvascular changes. Atrophy. No intracranial mass lesion noted on this unenhanced exam. Vascular: Vascular calcifications. Skull: Negative Sinuses/Orbits: No acute orbital abnormality. Visualized paranasal sinuses are clear. Other: Mastoid air cells and middle ear cavities are clear. IMPRESSION: No intracranial hemorrhage or CT evidence of large acute infarct. Remote superior right cerebellar infarct. Chronic microvascular changes. Atrophy. Electronically Signed   By: Genia Del M.D.   On: 05/21/2017 07:03     Patient Profile     83 year old male with past medical history of coronary artery disease, permanent atrial fibrillation, severe aortic stenosis, hypertension, hyperlipidemia, prior CVA admitted with urinary retention for evaluation of aortic stenosis and severe mitral regurgitation.  Patient was seen on December 2 in the emergency room with complaints of inability to urinate.  He then developed bilateral lower extremity edema and increased weight of 5-6 pounds.  He denies dyspnea, chest pain, palpitations or syncope.  He does describe fatigue.  No fevers or chills.  A Foley catheter was placed with improvement in his symptoms and edema.  An echocardiogram was ordered and showed normal LV systolic function, severe aortic stenosis with mean gradient 44 mmHg, severe mitral stenosis with mean gradient 11 mmHg and severe regurgitation due to flail leaflet.  There was severe biatrial enlargement and mild to moderate tricuspid regurgitation.  Cardiology asked to evaluate.    Assessment & Plan    1 volume overload-felt  related to inability to void; much improved following insertion of foley. FU urology following DC.  2 aortic stenosis-patient has severe aortic stenosis and also severe mitral regurgitation/severe mitral stenosis.  However prior to urinary retention he was not complaining of dyspnea on exertion, chest pain or syncope.  I have explained that he will likely need consideration of valve replacement in the future.  However given that he is asymptomatic would not pursue at this point.  This will be a very complicated decision as he would potentially require aortic and mitral valve replacements and possible coronary artery bypass and graft.  Alternatively we could consider TAVR for symptomatic relief with close follow-up of mitral regurgitation.  Regardless we will have further discussions in the office.  3 mitral regurgitation-as above.  4 permanent atrial fibrillation-continue present meds for rate control; continue anticoagulation.  5 hypertension-controlled; continue present meds.  6 hyponatremia-Wayne IM  We will sign off; please call with questions; pt to fu with me as scheduled next week.  For questions or updates, please contact [place] Please consult [email or web address] for contact info under Cardiology/STEMI.      Signed, Kirk Ruths, MD  05/22/2017, 11:57 AM

## 2017-05-22 NOTE — Progress Notes (Signed)
[facility] PROGRESS NOTE  Wayne [name] GEZ:[id number] DOB: 10-11-1929 DOA: 05/20/2017  PCP: [name], DO  Brief History/Interval Summary: 83 year old Caucasian male with a past medical history of stroke with residual weakness, chronic atrial fibrillation, severe aortic stenosis, moderate mitral regurgitation presented with complaints of weakness and difficulty urinating.  He was found to have urinary retention and a Foley catheter had to be placed.  He also mentioned weight gain and lower extremity edema.  He was found to have hyponatremia.  He was hospitalized for further management.  Reason for Visit: Hyponatremia.  Possible fluid overload.  Consultants: Cardiology  Procedures:  Transthoracic echocardiogram Study Conclusions  - Left ventricle: The cavity size was normal. There was severe   focal basal hypertrophy of the septum with otherwise mild   concentric hypertrophy. Systolic function was normal. Wall motion   was normal; there were no regional wall motion abnormalities. - Aortic valve: Valve mobility was restricted. There was severe   stenosis. There was no regurgitation. Valve area (VTI): 0.49   cm^2. Valve area (Vmax): 0.47 cm^2. Valve area (Vmean): 0.53   cm^2. - Mitral valve: Severely calcified annulus. Calcification.   Calcification. Mobility was restricted. Flail motion involving   the anterior leaflet. The findings are consistent with severe   stenosis. There was severe regurgitation directed eccentrically   and posteriorly. Valve area by pressure half-time: 1.63 cm^2.   Valve area by continuity equation (using LVOT flow): 0.98 cm^2. - Left atrium: The atrium was severely dilated. - Right ventricle: The cavity size was normal. Wall thickness was   normal. Systolic function was normal. - Right atrium: The atrium was severely dilated. - Tricuspid valve: There was mild-moderate regurgitation. - Pulmonary arteries: Systolic pressure was mildly  increased. PA   peak pressure: 43 mm Hg (S).  Impressions:  - Mitral regurgitation appears moderate visually but is likely   severe due to the flail leaflet with eccentric regurgitation.  Antibiotics: None  Subjective/Interval History: Patient states that he is feeling well this morning.  Denies any chest pain shortness of breath.  No nausea vomiting.    ROS: Denies any headaches.  Objective:  Vital Signs  Vitals:   05/21/17 1531 05/21/17 2100 05/22/17 0003 05/22/17 0604  BP: (!) 100/45 108/63 117/67 132/67  Pulse: 95 96 92 (!) 105  Resp: 18 18 18 18   Temp: 98.3 F (36.8 C) 98.9 F (37.2 C) 98.8 F (37.1 C) 97.6 F (36.4 C)  TempSrc: Oral Oral Oral Oral  SpO2: 94% 96% 97% 97%  Weight:    76.2 kg (168 lb)  Height:        Intake/Output Summary (Last 24 hours) at 05/22/2017 0924 Last data filed at 05/22/2017 0912 Gross per 24 hour  Intake 720 ml  Output 1201 ml  Net -481 ml   Filed Weights   05/20/17 2000 05/21/17 0635 05/22/17 0604  Weight: 77.9 kg (171 lb 11.2 oz) 76.9 kg (169 lb 9.6 oz) 76.2 kg (168 lb)    General appearance: Awake alert.  In no distress. Resp: Good air entry bilaterally.  No wheezing.  Perhaps a few crackles in the bases.  Normal effort.   Cardio: S1-S2 is normal regular.  No S3 or S4.  No rubs murmurs or bruit.  Improved pedal edema.   GI: Abdomen is soft.  Nontender nondistended.  Bowel sounds are present.  No masses organomegaly Extremities: Improving edema in the lower extremities. Neurologic: Awake alert.  No obvious focal neurological deficits.  Lab  Results:  Data Reviewed: I have personally reviewed following labs and imaging studies  CBC: Recent Labs  Lab 05/20/17 1416 05/21/17 0528 05/22/17 0543  WBC 8.8 9.7 8.6  NEUTROABS 7.2  --   --   HGB 10.7* 11.6* 11.4*  HCT 31.4* 34.7* 33.6*  MCV 83.3 83.0 83.2  PLT 174 184 423    Basic Metabolic Panel: Recent Labs  Lab 05/20/17 1416 05/21/17 0528 05/22/17 0543  NA 122*  126* 125*  K 4.0 3.8 4.0  CL 92* 92* 91*  CO2 23 23 25   GLUCOSE 127* 98 87  BUN 14 10 9   CREATININE 0.68 0.74 0.65  CALCIUM 8.0* 8.1* 8.1*    GFR: Estimated Creatinine Clearance: 70.9 mL/min (by C-G formula based on SCr of 0.65 mg/dL).  Liver Function Tests: Recent Labs  Lab 05/20/17 1416 05/21/17 0528  AST 23 24  ALT 17 19  ALKPHOS 46 48  BILITOT 1.1 1.3*  PROT 6.3* 6.1*  ALBUMIN 3.1* 2.9*    Coagulation Profile: Recent Labs  Lab 05/20/17 1416 05/21/17 0528 05/22/17 0543  INR 2.79 2.33 2.59    Cardiac Enzymes: Recent Labs  Lab 05/20/17 1416 05/20/17 2355 05/21/17 0528 05/21/17 1109  TROPONINI <0.03 <0.03 <0.03 <0.03   Thyroid Function Tests: Recent Labs    05/20/17 2355  TSH 2.425     Radiology Studies: Dg Chest 2 View  Result Date: 05/20/2017 CLINICAL DATA:  Shortness of breath. EXAM: CHEST  2 VIEW COMPARISON:  07/02/2016 FINDINGS: Top-normal heart size. Suggestion of mild interstitial edema. Bibasilar atelectasis versus infiltrates. No significant pleural effusions. IMPRESSION: Suggestion of mild interstitial pulmonary edema. Bibasilar atelectasis versus infiltrates. Electronically Signed   By: Aletta Edouard M.D.   On: 05/20/2017 15:15   Dg Lumbar Spine 2-3 Views  Result Date: 05/20/2017 CLINICAL DATA:  Chronic low back pain. EXAM: LUMBAR SPINE - 2-3 VIEW COMPARISON:  04/08/2017 FINDINGS: Three views study shows stable appearance superior endplate compression deformity at L3. Loss of disc height L2-3 also stable. No new fracture evident atherosclerotic calcification noted abdominal aorta. SI joints are unremarkable. IMPRESSION: 1. Superior endplate compression deformity at L3 is stable. 2. No new interval finding. Electronically Signed   By: Misty Stanley M.D.   On: 05/20/2017 15:12   Ct Head Wo Contrast  Result Date: 05/21/2017 CLINICAL DATA:  83 year old male with ataxia. Hypertension and atrial fibrillation. Initial encounter. EXAM: CT HEAD  WITHOUT CONTRAST TECHNIQUE: Contiguous axial images were obtained from the base of the skull through the vertex without intravenous contrast. COMPARISON:  12/05/2007. FINDINGS: Brain: No intracranial hemorrhage or CT evidence of large acute infarct. Remote superior right cerebellar infarct. Chronic microvascular changes. Atrophy. No intracranial mass lesion noted on this unenhanced exam. Vascular: Vascular calcifications. Skull: Negative Sinuses/Orbits: No acute orbital abnormality. Visualized paranasal sinuses are clear. Other: Mastoid air cells and middle ear cavities are clear. IMPRESSION: No intracranial hemorrhage or CT evidence of large acute infarct. Remote superior right cerebellar infarct. Chronic microvascular changes. Atrophy. Electronically Signed   By: Genia Del M.D.   On: 05/21/2017 07:03     Medications:  Scheduled: . alfuzosin  10 mg Oral Q breakfast  . cholecalciferol  2,000 Units Oral Daily  . digoxin  125 mcg Oral Daily  . finasteride  5 mg Oral Daily  . furosemide  20 mg Intravenous Once  . metoprolol tartrate  12.5 mg Oral QHS  . metoprolol tartrate  25 mg Oral q morning - 10a  . polyethylene glycol  17  g Oral Daily  . sodium chloride flush  3 mL Intravenous Q12H  . warfarin  2.5 mg Oral Once per day on Tue Sat  . warfarin  5 mg Oral Once per day on Sun Mon Wed Thu Fri  . Warfarin - Pharmacist Dosing Inpatient   Does not apply q1800   Continuous: . sodium chloride     NGE:XBMWUX chloride, acetaminophen **OR** acetaminophen, methocarbamol, sodium chloride flush  Assessment/Plan:  Principal Problem:   Hyponatremia Active Problems:   Chronic atrial fibrillation (HCC)   Acute on chronic diastolic CHF (congestive heart failure) (HCC)   Aortic stenosis   Urinary retention    Hyponatremia Considering pedal edema and evidence for vascular congestion on chest x-ray this is most likely hypervolemic in etiology.  Patient was given Lasix at the time of admission with  improvement in sodium level to 126 from 122.  However today it remains at 125.  We will give him additional dose of Lasix today and repeat labs tomorrow.  Unfortunately urine osmolality was not checked at the time of admission.    Acute on chronic diastolic CHF Patient noted to have lower extremity edema/weight gain/abnormal chest x-ray.  Patient was given Lasix at admission brisk diuresis.  His weight has decreased.  He will be given additional Lasix today. Echocardiogram report as above.  Systolic function is normal.  Severe aortic stenosis and moderate to severe mitral regurgitation Management per cardiology.  Please see their note for details.  History of chronic atrial fibrillation Stable.  Continue home medications including digoxin and metoprolol.  He is anticoagulated with warfarin which will be continued per pharmacy.  Urinary retention in a patient with known history of BPH Foley catheter had to be placed for urinary retention.  Continue for now.  Start mobilizing.  Discussed in detail with patient's daughter.  Will prefer that this be addressed as an outpatient in his urologist office.  He is followed by Dr. Karsten Ro.  Continue finasteride and Uroxatral.  Will need to be discharged with Foley and leg bag.  Generalized weakness/fatigue Most likely due to all of the above issues.  PT and OT evaluation.  UA did not show any acute findings.  Previous history of stroke Stable.  Normocytic anemia Probably due to chronic kidney disease.  No evidence of overt bleeding.  Continue to monitor.  DVT Prophylaxis: Patient is on warfarin    Code Status: Full code Family Communication: Discussed with the patient and his daughter Disposition Plan: Management as outlined above.  PT and OT evaluation.    LOS: 2 days   [facility] Pager [phone] 05/22/2017, 9:24 AM  If 7PM-7AM, please contact night-coverage at [email or web address], password [facility]

## 2017-05-22 NOTE — Evaluation (Signed)
Occupational Therapy Evaluation Patient Details Name: Wayne [name] MRN: [id number] DOB: 03-22-1930 Today's Date: 05/22/2017    History of Present Illness Pt is an 83 y.o. male who presented with weakness and difficulty urinating. Found to have urinary retention requiring Foley catheter placement and hyponatremia. PMH significant for stroke with residual weakness, chronic atrial fibrillation, severe aortic stenosis, and moderate mitral regurgitation.   Clinical Impression   PTA, pt reports living with his wife who has limited mobility and completing basic ADL and functional mobility at modified independent level. Pt reports utilizing RW for mobility on the second floor of his home and holds on to furniture to walk on the main level. Pt enjoys walking in stores while pushing a buddy for stability nearly daily. He currently presents with generalized weakness, decreased activity tolerance for ADL, and decreased dynamic balance impacting his ability to participate in ADL at [facility]. Pt currently requires min guard assist for LB ADL and min assist for toilet transfers with RW. He would benefit from continued OT services while admitted to improve independence and safety with ADL and functional mobility. Recommend home health OT follow-up post-acute D/C. Will continue to follow.     Follow Up Recommendations  Home health OT;Supervision/Assistance - 24 hour    Equipment Recommendations  3 in 1 bedside commode    Recommendations for Other Services       Precautions / Restrictions Precautions Precautions: Fall Restrictions Weight Bearing Restrictions: No      Mobility Bed Mobility Overal bed mobility: Needs Assistance Bed Mobility: Supine to Sit     Supine to sit: Supervision     General bed mobility comments: Supervision for safety. No physical assistance  Transfers Overall transfer level: Needs assistance Equipment used: Rolling walker (2 wheeled) Transfers: Sit to/from  Stand Sit to Stand: Min guard         General transfer comment: Min gaurd to safely rise to standing.     Balance Overall balance assessment: Needs assistance Sitting-balance support: No upper extremity supported;Feet supported Sitting balance-Leahy Scale: Fair     Standing balance support: Bilateral upper extremity supported;No upper extremity supported;During functional activity Standing balance-Leahy Scale: Poor Standing balance comment: Requires min guard assist without UE support                            ADL either performed or assessed with clinical judgement   ADL Overall ADL's : Needs assistance/impaired Eating/Feeding: Set up;Sitting   Grooming: Standing;Min guard Grooming Details (indicate cue type and reason): Min guard for balance at sink. Swaying noted when standing without UE support.  Upper Body Bathing: Set up;Sitting   Lower Body Bathing: Sit to/from stand;Min guard   Upper Body Dressing : Set up;Sitting   Lower Body Dressing: Sit to/from stand;Min guard   Toilet Transfer: Minimal assistance;Ambulation;RW Toilet Transfer Details (indicate cue type and reason): simulated with sit<> stand followed by ambulation in room Toileting- Clothing Manipulation and Hygiene: Sit to/from stand;Min guard       Functional mobility during ADLs: Minimal assistance;Rolling walker General ADL Comments: Pt limited by generalized weakness and decreased activity tolerance for ADL.      Vision   Additional Comments: Need to further assess. Able to identify items during session. History of CVA.      Perception     Praxis      Pertinent Vitals/Pain Pain Assessment: No/denies pain     Hand Dominance     Extremity/Trunk Assessment  Upper Extremity Assessment Upper Extremity Assessment: Generalized weakness   Lower Extremity Assessment Lower Extremity Assessment: Generalized weakness       Communication Communication Communication: No difficulties    Cognition Arousal/Alertness: Awake/alert Behavior During Therapy: WFL for tasks assessed/performed Overall Cognitive Status: Within Functional Limits for tasks assessed                                 General Comments: Slow to respond at times due to difficulty hearing and when first waking up.    General Comments  Pt on 2L O2 via [place] during session. Removed in preparation for PT session with pt maintaining saturation 97% in standing for brief period prior to OT session ending.     Exercises     Shoulder Instructions      Home Living Family/patient expects to be discharged to:: Private residence Living Arrangements: Spouse/significant other Available Help at Discharge: Family;Available 24 hours/day(wife with limited mobility per pt) Type of Home: House Home Access: Stairs to enter [employer] of Steps: "a coupleDesigner, industrial/product: None Home Layout: Multi-level;Bed/bath upstairs(3 level; no bedroom/bathroom on main level)     Bathroom Shower/Tub: Occupational psychologist: Standard     Home Equipment: Environmental consultant - 2 wheels          Prior Functioning/Environment Level of Independence: Independent with assistive device(s)        Comments: Reports using RW in the upstairs portion of home and furniture walking on the main level. Enjoys walking and walks with cart at stores nearly daily.         OT Problem List: Decreased strength;Decreased activity tolerance;Impaired balance (sitting and/or standing);Decreased safety awareness;Decreased knowledge of use of DME or AE;Decreased knowledge of precautions;Pain      OT Treatment/Interventions: Self-care/ADL training;Therapeutic exercise;Energy conservation;DME and/or AE instruction;Therapeutic activities;Patient/family education;Balance training    OT Goals(Current goals can be found in the care plan section) Acute Rehab OT Goals Patient Stated Goal: to get therapy OT Goal Formulation: With  patient Time For Goal Achievement: 06/05/17 Potential to Achieve Goals: Good ADL Goals Pt Will Perform Grooming: with modified independence;standing Pt Will Perform Lower Body Dressing: with modified independence;sit to/from stand Pt Will Transfer to Toilet: with modified independence;ambulating;regular height toilet Pt Will Perform Toileting - Clothing Manipulation and hygiene: with modified independence;sit to/from stand Pt Will Perform Tub/Shower Transfer: with modified independence;Shower transfer;ambulating;rolling walker Additional ADL Goal #1: Pt will verbalize 3 strategies to conserve energy during morning ADL routine independently.  OT Frequency: Min 2X/week   Barriers to D/C:            Co-evaluation              AM-PAC PT "6 Clicks" Daily Activity     Outcome Measure Help from another person eating meals?: A Little Help from another person taking care of personal grooming?: A Little Help from another person toileting, which includes using toliet, bedpan, or urinal?: A Little Help from another person bathing (including washing, rinsing, drying)?: A Little Help from another person to put on and taking off regular upper body clothing?: A Little Help from another person to put on and taking off regular lower body clothing?: A Little 6 Click Score: 18   End of Session Equipment Utilized During Treatment: Gait belt;Rolling walker Nurse Communication: Mobility status  Activity Tolerance: Patient tolerated treatment well Patient left: Other (comment)(in room ambulating with PT)  OT Visit Diagnosis:  Muscle weakness (generalized) (M62.81);Other abnormalities of gait and mobility (R26.89)                Time: 1001-1025 OT Time Calculation (min): 24 min Charges:  OT General Charges $OT Visit: 1 Visit OT Evaluation $OT Eval Moderate Complexity: 1 Mod G-Codes:     Norman Herrlich, MS OTR/L  Pager: [place] A Wayne [name] 05/22/2017, 10:53 AM

## 2017-05-22 NOTE — Discharge Instructions (Signed)

## 2017-05-22 NOTE — Progress Notes (Signed)
[place] for Coumadin Indication: atrial fibrillation  Allergies  Allergen Reactions  . Prednisone Other (See Comments)    Wheezing  . Procaine Hcl Other (See Comments)    Passed out after being given this at dental appointment  . Levofloxacin Rash  . Oxycodone Other (See Comments)    constipation  . Penicillins Rash    Has patient had a PCN reaction causing immediate rash, facial/tongue/throat swelling, SOB or lightheadedness with hypotension: Yes Has patient had a PCN reaction causing severe rash involving mucus membranes or skin necrosis: No Has patient had a PCN reaction that required hospitalization No Has patient had a PCN reaction occurring within the last 10 years: No If all of the above answers are "NO", then may proceed with Cephalosporin use.  . Tylenol [Acetaminophen] Other (See Comments)    Makes him very sleepy if he takes more than one tablet    Labs: Recent Labs    05/20/17 1416 05/20/17 2355 05/21/17 0528 05/21/17 1109 05/22/17 0543  HGB 10.7*  --  11.6*  --  11.4*  HCT 31.4*  --  34.7*  --  33.6*  PLT 174  --  184  --  198  LABPROT 29.2*  --  25.4*  --  27.6*  INR 2.79  --  2.33  --  2.59  CREATININE 0.68  --  0.74  --  0.65  TROPONINI <0.03 <0.03 <0.03 <0.03  --     Estimated Creatinine Clearance: 70.9 mL/min (by C-G formula based on SCr of 0.65 mg/dL).   Assessment: 83yo male admitted for hyponatremia, to continue Coumadin for Afib; current INR at goal on home regimen  Goal of Therapy:  INR 2-3   Plan:  Will continue home Coumadin dose of 5mg  MWTFSun and 2.5mg  TueSat Daily INR  Thank you Anette Guarneri, PharmD [phone]  05/22/2017,8:31 AM

## 2017-05-22 NOTE — Progress Notes (Signed)
Family desires for patient to have an urology consult while he is in-patient. They are requesting Dr. Loel Lofty. Note to Dr. Maryland Pink.

## 2017-05-23 DIAGNOSIS — E871 Hypo-osmolality and hyponatremia: Secondary | ICD-10-CM

## 2017-05-23 LAB — BASIC METABOLIC PANEL
Anion gap: 10 (ref 5–15)
BUN: 11 mg/dL (ref 6–20)
CALCIUM: 8.4 mg/dL — AB (ref 8.9–10.3)
CO2: 26 mmol/L (ref 22–32)
CREATININE: 0.73 mg/dL (ref 0.61–1.24)
Chloride: 92 mmol/L — ABNORMAL LOW (ref 101–111)
GFR calc non Af Amer: >60 mL/min (ref >60)
Glucose, Bld: 91 mg/dL (ref 65–99)
Potassium: 4.2 mmol/L (ref 3.5–5.1)
SODIUM: 128 mmol/L — AB (ref 135–145)

## 2017-05-23 LAB — PROTIME-INR
INR: 2.87
PROTHROMBIN TIME: 29.9 s — AB (ref 11.4–15.2)

## 2017-05-23 MED ORDER — FUROSEMIDE 20 MG PO TABS: 20.0000 mg | ORAL_TABLET | Freq: Every day | ORAL | 0 refills | Status: DC | PRN

## 2017-05-23 MED ORDER — POTASSIUM CHLORIDE ER 10 MEQ PO TBCR: 10.0000 meq | EXTENDED_RELEASE_TABLET | Freq: Every day | ORAL | 0 refills | Status: DC | PRN

## 2017-05-23 MED ORDER — POTASSIUM CHLORIDE ER 20 MEQ PO TBCR: 20.0000 meq | EXTENDED_RELEASE_TABLET | Freq: Every day | ORAL | 0 refills | Status: DC | PRN

## 2017-05-23 MED ORDER — WARFARIN SODIUM 2.5 MG PO TABS: 2.5000 mg | ORAL_TABLET | Freq: Once | ORAL | Status: DC

## 2017-05-23 MED ORDER — CHOLECALCIFEROL 50 MCG (2000 UT) PO TABS: 2000.0000 [IU] | ORAL_TABLET | Freq: Every day | ORAL | 0 refills | Status: DC

## 2017-05-23 NOTE — Care Management Note (Signed)
Case Management Note  Patient Details  Name: Caydin Yeatts MRN: [id number] Date of Birth: 1930/05/25  Subjective/Objective:  Hyponatremia                Action/Plan: CM talked to patient with his spouse at the bedside; patient is going home with foley catheter; [place] choice offered, pt/ spouse chose [place]; Don with [facility] called for arrangements.  Expected Discharge Date:  05/23/17               Expected Discharge Plan:  [place]  Discharge planning Services  CM Consult  Choice offered to:  Patient, Spouse  HH Arranged:  RN, PT [facility] Agency:  [place]  Status of Service:  In process, will continue to follow  Sherrilyn Rist [phone] 05/23/2017, 12:33 PM

## 2017-05-23 NOTE — Progress Notes (Signed)
Patient ready for discharge to home with wife. All personal belongings with patient. Discharge instructions, meds , follow up appointments and foley cath care reviewed with patient and wife.  Patient ambulated hallway to nurses desk and back to his room without 02 maintaining a room air sat of 95. No sob or c/o made.  Pt voices no complaint and demonstrates no distress.  IV to left forearm removed without incidence. Site without redness or drainage.  Patient will be followed by [facility]

## 2017-05-23 NOTE — Discharge Summary (Signed)
Physician Discharge Summary  Wayne [name] XIP:[id number] DOB: [birth year] DOA: 05/20/2017  PCP: [name], DO  Admit date: 05/20/2017 Discharge date: 05/23/2017  Admitted From: home  Disposition:  Home   Recommendations for Outpatient Follow-up:  1. Follow up with PCP in 1-2 weeks 2. Please obtain BMP/CBC in one week 3. Follow up with urology for voiding trial.  4. Follow with cardiology, monitor weight, assess volume status, adjust lasix and further discussion regarding valve replacement.   Home Health: yes.   Discharge Condition: stable.  CODE STATUS: full code.  Diet recommendation: Heart Healthy   Brief/Interim Summary: 83 year old Caucasian male with a past medical history of stroke with residual weakness, chronic atrial fibrillation, severe aortic stenosis, moderate mitral regurgitation presented with complaints of weakness and difficulty urinating.  He was found to have urinary retention and a Foley catheter had to be placed.  He also mentioned weight gain and lower extremity edema.  He was found to have hyponatremia.  He was hospitalized for further management.   Hyponatremia Considering pedal edema and evidence for vascular congestion on chest x-ray this is most likely hypervolemic in etiology.  Patient was given Lasix at the time of admission with improvement in sodium level to 126 from 122.  His hyponatremia improved with l;asix. Reviewing records he has chronic hyponatremia sodium range from 128 to 131.  Stable.    Acute on chronic diastolic CHF Patient noted to have lower extremity edema/weight gain/abnormal chest x-ray.  Patient was given Lasix at admission  and on 12-05.  . Echocardiogram report as above.  Systolic function is normal. his weight has decreased from 174 to 167./  He is feeling better. He will be discharge on 20 mg lasix, with instruction to take lasix if his weight increases more than 2 pounds in 24 hours.  Close follow up with  cardiology   Severe aortic stenosis and moderate to severe mitral regurgitation Management per cardiology.  Please see their note for details. Further out patient discussion regarding sx.   History of chronic atrial fibrillation Stable.  Continue home medications including digoxin and metoprolol.  He is anticoagulated with warfarin which will be continued per pharmacy. resume home dose.   Urinary retention in a patient with known history of BPH Foley catheter had to be placed for urinary retention.  Continue for now.  Start mobilizing.  Discussed in detail with patient's daughter.  Will prefer that this be addressed as an outpatient in his urologist office.  He is followed by Dr. Karsten Ro.  Continue finasteride and Uroxatral.  Will need to be discharged with Foley and leg bag.  urology consulted, they agree with Dr Maryland Pink, out patient follow up. discharge with foley. Follow up in the office.   Generalized weakness/fatigue Most likely due to all of the above issues.  PT and OT evaluation.  UA did not show any acute findings.  home health PT.   Previous history of stroke Stable.  Normocytic anemia Probably due to chronic kidney disease.  No evidence of overt bleeding.  Continue to monitor.     Discharge Diagnoses:  Principal Problem:   Hyponatremia Active Problems:   Chronic atrial fibrillation (HCC)   Acute on chronic diastolic CHF (congestive heart failure) (HCC)   Aortic stenosis   Urinary retention    Discharge Instructions  Discharge Instructions    Diet - low sodium heart healthy   Complete by:  As directed    Increase activity slowly   Complete by:  As directed  Allergies as of 05/23/2017      Reactions   Prednisone Other (See Comments)   Wheezing   Procaine Hcl Other (See Comments)   Passed out after being given this at dental appointment   Levofloxacin Rash   Oxycodone Other (See Comments)   constipation   Penicillins Rash   Has patient had a  PCN reaction causing immediate rash, facial/tongue/throat swelling, SOB or lightheadedness with hypotension: Yes Has patient had a PCN reaction causing severe rash involving mucus membranes or skin necrosis: No Has patient had a PCN reaction that required hospitalization No Has patient had a PCN reaction occurring within the last 10 years: No If all of the above answers are "NO", then may proceed with Cephalosporin use.   Tylenol [acetaminophen] Other (See Comments)   Makes him very sleepy if he takes more than one tablet      Medication List    TAKE these medications   alfuzosin 10 MG 24 hr tablet Commonly known as:  UROXATRAL Take 10 mg by mouth daily with breakfast.   Cholecalciferol 2000 units Tabs Take 1 tablet (2,000 Units total) by mouth daily. Start taking on:  05/24/2017   digoxin 0.125 MG tablet Commonly known as:  LANOXIN TAKE ONE TABLET BY MOUTH ONCE DAILY What changed:    how much to take  how to take this  when to take this   finasteride 5 MG tablet Commonly known as:  PROSCAR Take 1 tablet (5 mg total) by mouth daily.   furosemide 20 MG tablet Commonly known as:  LASIX Take 1 tablet (20 mg total) by mouth daily as needed for fluid or edema (take one tablet a day  if your weight increases more than 2 pounds in 24 hours.).   methocarbamol 500 MG tablet Commonly known as:  ROBAXIN Take 500 mg by mouth every 6 (six) hours as needed (cramps).   metoprolol tartrate 25 MG tablet Commonly known as:  LOPRESSOR TAKE ONE TABLET BY MOUTH IN THE MORNING, THEN TAKE ONE-HALF IN THE EVENING What changed:  See the new instructions.   polyethylene glycol packet Commonly known as:  MIRALAX / GLYCOLAX Take 17 g by mouth daily.   potassium chloride 10 MEQ tablet Commonly known as:  K-DUR Take 1 tablet (10 mEq total) by mouth daily as needed. Take potasium if you take lasix.   warfarin 2.5 MG tablet Commonly known as:  COUMADIN Take as directed. If you are unsure how  to take this medication, talk to your nurse or doctor. Original instructions:  TAKE 1 TO 2 TABLETS BY MOUTH ONCE DAILY AS DIRECTED BY  COUMADIN  CLINIC What changed:    how much to take  how to take this  when to take this  additional instructions      Follow-up Information    Kathie Rhodes, MD. Schedule an appointment as soon as possible for a visit in 1 week(s).   Specialty:  Urology Why:  for removal of the catheter Contact information: [place] [address] [phone]        Lelon Perla, MD Follow up in 1 week(s).   Specialty:  Cardiology Contact information: [address] [place] [address] [phone]          Allergies  Allergen Reactions  . Prednisone Other (See Comments)    Wheezing  . Procaine Hcl Other (See Comments)    Passed out after being given this at dental appointment  . Levofloxacin  Rash  . Oxycodone Other (See Comments)    constipation  . Penicillins Rash    Has patient had a PCN reaction causing immediate rash, facial/tongue/throat swelling, SOB or lightheadedness with hypotension: Yes Has patient had a PCN reaction causing severe rash involving mucus membranes or skin necrosis: No Has patient had a PCN reaction that required hospitalization No Has patient had a PCN reaction occurring within the last 10 years: No If all of the above answers are "NO", then may proceed with Cephalosporin use.  . Tylenol [Acetaminophen] Other (See Comments)    Makes him very sleepy if he takes more than one tablet    Consultations:  Cardiology    Procedures/Studies: Dg Chest 2 View  Result Date: 05/20/2017 CLINICAL DATA:  Shortness of breath. EXAM: CHEST  2 VIEW COMPARISON:  07/02/2016 FINDINGS: Top-normal heart size. Suggestion of mild interstitial edema. Bibasilar atelectasis versus infiltrates. No significant pleural effusions. IMPRESSION: Suggestion of mild interstitial pulmonary edema. Bibasilar atelectasis  versus infiltrates. Electronically Signed   By: Aletta Edouard M.D.   On: 05/20/2017 15:15   Dg Lumbar Spine 2-3 Views  Result Date: 05/20/2017 CLINICAL DATA:  Chronic low back pain. EXAM: LUMBAR SPINE - 2-3 VIEW COMPARISON:  04/08/2017 FINDINGS: Three views study shows stable appearance superior endplate compression deformity at L3. Loss of disc height L2-3 also stable. No new fracture evident atherosclerotic calcification noted abdominal aorta. SI joints are unremarkable. IMPRESSION: 1. Superior endplate compression deformity at L3 is stable. 2. No new interval finding. Electronically Signed   By: Misty Stanley M.D.   On: 05/20/2017 15:12   Ct Head Wo Contrast  Result Date: 05/21/2017 CLINICAL DATA:  83 year old male with ataxia. Hypertension and atrial fibrillation. Initial encounter. EXAM: CT HEAD WITHOUT CONTRAST TECHNIQUE: Contiguous axial images were obtained from the base of the skull through the vertex without intravenous contrast. COMPARISON:  12/05/2007. FINDINGS: Brain: No intracranial hemorrhage or CT evidence of large acute infarct. Remote superior right cerebellar infarct. Chronic microvascular changes. Atrophy. No intracranial mass lesion noted on this unenhanced exam. Vascular: Vascular calcifications. Skull: Negative Sinuses/Orbits: No acute orbital abnormality. Visualized paranasal sinuses are clear. Other: Mastoid air cells and middle ear cavities are clear. IMPRESSION: No intracranial hemorrhage or CT evidence of large acute infarct. Remote superior right cerebellar infarct. Chronic microvascular changes. Atrophy. Electronically Signed   By: Genia Del M.D.   On: 05/21/2017 07:03     Subjective: He is feeling well, denies dyspnea, would like to go home   Discharge Exam: Vitals:   05/22/17 2208 05/23/17 0500  BP: (!) 99/55 (!) 103/58  Pulse: 90 74  Resp:  18  Temp:  (!) 97.4 F (36.3 C)  SpO2:  95%   Vitals:   05/22/17 1628 05/22/17 2054 05/22/17 2208 05/23/17 0500   BP: 97/60 (!) 95/44 (!) 99/55 (!) 103/58  Pulse: 76 76 90 74  Resp:    18  Temp:  (!) 97.5 F (36.4 C)  (!) 97.4 F (36.3 C)  TempSrc:  Oral  Oral  SpO2: 93% 94%  95%  Weight:    75.8 kg (167 lb 1.7 oz)  Height:        General: Pt is alert, awake, not in acute distress Cardiovascular: RRR, J2/E2 +, systolic murmur Respiratory: CTA bilaterally, no wheezing, no rhonchi Abdominal: Soft, NT, ND, bowel sounds + Extremities: no edema, no cyanosis    The results of significant diagnostics from this hospitalization (including imaging, microbiology, ancillary and laboratory) are listed below for  reference.     Microbiology: No results found for this or any previous visit (from the past 240 hour(s)).   Labs: BNP (last 3 results) Recent Labs    06/30/16 2133 05/20/17 1416  BNP 195.3* 803.2*   Basic Metabolic Panel: Recent Labs  Lab 05/20/17 1416 05/21/17 0528 05/22/17 0543 05/23/17 0513  NA 122* 126* 125* 128*  K 4.0 3.8 4.0 4.2  CL 92* 92* 91* 92*  CO2 23 23 25 26   GLUCOSE 127* 98 87 91  BUN 14 10 9 11   CREATININE 0.68 0.74 0.65 0.73  CALCIUM 8.0* 8.1* 8.1* 8.4*   Liver Function Tests: Recent Labs  Lab 05/20/17 1416 05/21/17 0528  AST 23 24  ALT 17 19  ALKPHOS 46 48  BILITOT 1.1 1.3*  PROT 6.3* 6.1*  ALBUMIN 3.1* 2.9*   No results for input(s): LIPASE, AMYLASE in the last 168 hours. No results for input(s): AMMONIA in the last 168 hours. CBC: Recent Labs  Lab 05/20/17 1416 05/21/17 0528 05/22/17 0543  WBC 8.8 9.7 8.6  NEUTROABS 7.2  --   --   HGB 10.7* 11.6* 11.4*  HCT 31.4* 34.7* 33.6*  MCV 83.3 83.0 83.2  PLT 174 184 198   Cardiac Enzymes: Recent Labs  Lab 05/20/17 1416 05/20/17 2355 05/21/17 0528 05/21/17 1109  TROPONINI <0.03 <0.03 <0.03 <0.03   BNP: Invalid input(s): POCBNP CBG: No results for input(s): GLUCAP in the last 168 hours. D-Dimer No results for input(s): DDIMER in the last 72 hours. Hgb A1c No results for input(s):  HGBA1C in the last 72 hours. Lipid Profile No results for input(s): CHOL, HDL, LDLCALC, TRIG, CHOLHDL, LDLDIRECT in the last 72 hours. Thyroid function studies Recent Labs    05/20/17 2355  TSH 2.425   Anemia work up No results for input(s): VITAMINB12, FOLATE, FERRITIN, TIBC, IRON, RETICCTPCT in the last 72 hours. Urinalysis    Component Value Date/Time   COLORURINE YELLOW 05/20/2017 1517   APPEARANCEUR CLEAR 05/20/2017 1517   LABSPEC 1.020 05/20/2017 1517   PHURINE 6.0 05/20/2017 1517   GLUCOSEU NEGATIVE 05/20/2017 1517   HGBUR NEGATIVE 05/20/2017 1517   BILIRUBINUR NEGATIVE 05/20/2017 1517   KETONESUR 15 (A) 05/20/2017 1517   PROTEINUR NEGATIVE 05/20/2017 1517   NITRITE NEGATIVE 05/20/2017 1517   LEUKOCYTESUR NEGATIVE 05/20/2017 1517   Sepsis Labs Invalid input(s): PROCALCITONIN,  WBC,  LACTICIDVEN Microbiology No results found for this or any previous visit (from the past 240 hour(s)).   Time coordinating discharge: Over 30 minutes  SIGNED:   Elmarie Shiley, MD  [facility] 05/23/2017, 11:36 AM Pager   If 7PM-7AM, please contact night-coverage [email or web address] Password [email or web address]

## 2017-05-23 NOTE — Progress Notes (Signed)
[place] for Coumadin Indication: atrial fibrillation  Allergies  Allergen Reactions  . Prednisone Other (See Comments)    Wheezing  . Procaine Hcl Other (See Comments)    Passed out after being given this at dental appointment  . Levofloxacin Rash  . Oxycodone Other (See Comments)    constipation  . Penicillins Rash    Has patient had a PCN reaction causing immediate rash, facial/tongue/throat swelling, SOB or lightheadedness with hypotension: Yes Has patient had a PCN reaction causing severe rash involving mucus membranes or skin necrosis: No Has patient had a PCN reaction that required hospitalization No Has patient had a PCN reaction occurring within the last 10 years: No If all of the above answers are "NO", then may proceed with Cephalosporin use.  . Tylenol [Acetaminophen] Other (See Comments)    Makes him very sleepy if he takes more than one tablet    Labs: Recent Labs    05/20/17 1416 05/20/17 2355 05/21/17 0528 05/21/17 1109 05/22/17 0543 05/23/17 0513  HGB 10.7*  --  11.6*  --  11.4*  --   HCT 31.4*  --  34.7*  --  33.6*  --   PLT 174  --  184  --  198  --   LABPROT 29.2*  --  25.4*  --  27.6* 29.9*  INR 2.79  --  2.33  --  2.59 2.87  CREATININE 0.68  --  0.74  --  0.65 0.73  TROPONINI <0.03 <0.03 <0.03 <0.03  --   --     Estimated Creatinine Clearance: 70.9 mL/min (by C-G formula based on SCr of 0.73 mg/dL).   Assessment: 84yo male admitted for hyponatremia, to continue Coumadin for Afib; current INR at goal, but trending up  Goal of Therapy:  INR 2-3   Plan:  Warfarin 2.5 mg po x 1 today instead of 5 mg (home dose) Daily INR  Thank you Anette Guarneri, PharmD [phone]  05/23/2017,8:45 AM

## 2017-05-24 ENCOUNTER — Emergency Department (HOSPITAL_COMMUNITY)
Admission: EM | Admit: 2017-05-24 | Discharge: 2017-05-24 | Disposition: A | Discharge status: Home / Self Care | Payer: Medicare HMO | Attending: Emergency Medicine | Admitting: Emergency Medicine

## 2017-05-24 ENCOUNTER — Encounter (HOSPITAL_COMMUNITY): Payer: Self-pay | Admitting: Emergency Medicine

## 2017-05-24 ENCOUNTER — Other Ambulatory Visit: Payer: Self-pay

## 2017-05-24 DIAGNOSIS — I110 Hypertensive heart disease with heart failure: Secondary | ICD-10-CM | POA: Diagnosis not present

## 2017-05-24 DIAGNOSIS — Z7901 Long term (current) use of anticoagulants: Secondary | ICD-10-CM | POA: Insufficient documentation

## 2017-05-24 DIAGNOSIS — Z8673 Personal history of transient ischemic attack (TIA), and cerebral infarction without residual deficits: Secondary | ICD-10-CM | POA: Diagnosis not present

## 2017-05-24 DIAGNOSIS — I2510 Atherosclerotic heart disease of native coronary artery without angina pectoris: Secondary | ICD-10-CM | POA: Insufficient documentation

## 2017-05-24 DIAGNOSIS — I481 Persistent atrial fibrillation: Secondary | ICD-10-CM | POA: Diagnosis not present

## 2017-05-24 DIAGNOSIS — R002 Palpitations: Secondary | ICD-10-CM | POA: Diagnosis present

## 2017-05-24 DIAGNOSIS — I5033 Acute on chronic diastolic (congestive) heart failure: Secondary | ICD-10-CM | POA: Diagnosis not present

## 2017-05-24 DIAGNOSIS — Z79899 Other long term (current) drug therapy: Secondary | ICD-10-CM | POA: Diagnosis not present

## 2017-05-24 DIAGNOSIS — R3914 Feeling of incomplete bladder emptying: Secondary | ICD-10-CM | POA: Diagnosis not present

## 2017-05-24 DIAGNOSIS — I252 Old myocardial infarction: Secondary | ICD-10-CM | POA: Insufficient documentation

## 2017-05-24 DIAGNOSIS — I4819 Other persistent atrial fibrillation: Secondary | ICD-10-CM

## 2017-05-24 MED ORDER — BACITRACIN ZINC 500 UNIT/GM EX OINT
1.0000 "application " | TOPICAL_OINTMENT | Freq: Two times a day (BID) | CUTANEOUS | Status: DC
  Administered 2017-05-24: 1 via TOPICAL

## 2017-05-24 NOTE — ED Notes (Signed)
Pt taken from lobby to treatment room, EKG obtained and taken to E45. MD little at bedside. Pt states he was just discharged yesterday at 5pm  and was sent home with a foley bag and went to his pcp for a leg bag and sent her after being told he was in afib. Pt has no compliants.

## 2017-05-24 NOTE — ED Provider Notes (Signed)
[facility] EMERGENCY DEPARTMENT Provider Note   CSN: [id number] Arrival date & time: 05/24/17  1121     History   Chief Complaint No chief complaint on file.   HPI Wayne [name] is a 83 y.o. male.  83 year old male with extensive past medical history including CHF, aortic and mitral stenosis, atrial fibrillation on Coumadin who presents with tachycardia.  I admitted the patient to the hospital on 12/2 for CHF exacerbation and hyponatremia.  He was discharged yesterday after improvement in his sodium and diuresis.  He also had urinary retention at presentation and Foley catheter was placed.  He went to the urology clinic today to be switched to a leg bag and was incidentally noted to have heart rate in the 160s, they thought he was in A. fib with RVR so they sent him to the ED for evaluation.  He denies any new complaints whatsoever including no chest pain, shortness of breath, or heart racing sensation.  He is taking all of his morning medications as prescribed.  He feels well and wants to go home.   The history is provided by the patient and the spouse.    Past Medical History:  Diagnosis Date  . Aortic stenosis   . Arthritis   . Atrial fibrillation (HCC)    Chronic  . BPH (benign prostatic hyperplasia)   . CAD (coronary artery disease)   . Chronic anticoagulation   . History of chicken pox   . HTN (hypertension)   . Mitral stenosis   . Old MI (myocardial infarction) 2007   s/p PCI to distal OM (no stent)  . 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 Acute on chronic diastolic CHF (congestive heart failure) ([place]) 05/20/2017  . Hyponatremia 05/20/2017  . Aortic stenosis 05/20/2017  . Urinary retention   . Arthritis of facet joints at multiple vertebral levels ([place]) 04/26/2017  . Spinal stenosis at L4-L5 level 04/26/2017  . Lumbar radiculopathy, chronic 04/26/2017  . Benign colonic polyp- 25 yrs ago.   11/16/2016  . Vitamin D deficiency 11/16/2016  . H/O non anemic vitamin B12 deficiency 11/16/2016  . Mild Colonic dysmotility 11/16/2016  . Hypokalemia 09/13/2016  . BPH with obstruction/lower urinary tract symptoms 09/13/2016  . Urinary frequency 07/10/2016  . Left bundle branch block (LBBB) on electrocardiogram 06/30/2016  . History of stroke 06/30/2016  . Generalized weakness 06/30/2016  . Osteoarthritis 04/04/2016  . Encounter for therapeutic drug monitoring 08/06/2013  . Chronic anticoagulation   . HTN (hypertension)   . CAD (coronary artery disease)   . Chronic atrial fibrillation ([place]) 09/13/2010  . h/o CVA (cerebrovascular accident due to intracerebral hemorrhage) 09/13/2010    Past Surgical History:  Procedure Laterality Date  . CORONARY ANGIOPLASTY  2007   Distal OM  . PROSTATE BIOPSY         Home Medications    Prior to Admission medications   Medication Sig Start Date End Date Taking? Authorizing Provider  alfuzosin (UROXATRAL) 10 MG 24 hr tablet Take 10 mg by mouth daily with breakfast.    [provider]  cholecalciferol 2000 units TABS Take 1 tablet (2,000 Units total) by mouth daily. 05/24/17   Regalado, Cassie Freer, MD  digoxin (LANOXIN) 0.125 MG tablet TAKE ONE TABLET BY MOUTH ONCE DAILY Patient taking differently: TAKE ONE TABLET (175mcg) BY MOUTH ONCE DAILY 03/13/17   Lelon Perla, MD  finasteride (PROSCAR) 5 MG tablet Take  1 tablet (5 mg total) by mouth daily. 02/14/17   Lelon Perla, MD  furosemide (LASIX) 20 MG tablet Take 1 tablet (20 mg total) by mouth daily as needed for fluid or edema (take one tablet a day  if your weight increases more than 2 pounds in 24 hours.). 05/23/17   Regalado, Belkys A, MD  methocarbamol (ROBAXIN) 500 MG tablet Take 500 mg by mouth every 6 (six) hours as needed (cramps).     [provider]  metoprolol tartrate (LOPRESSOR) 25 MG tablet TAKE ONE TABLET BY MOUTH IN THE MORNING, THEN TAKE ONE-HALF IN THE  EVENING Patient taking differently: TAKE ONE TABLET (25mg ) BY MOUTH IN THE MORNING, THEN TAKE ONE-HALF (12.5mg ) IN THE EVENING 03/13/17   Lelon Perla, MD  polyethylene glycol (MIRALAX / GLYCOLAX) packet Take 17 g by mouth daily.    [provider]  potassium chloride (K-DUR) 10 MEQ tablet Take 1 tablet (10 mEq total) by mouth daily as needed. Take potasium if you take lasix. 05/23/17   Regalado, Belkys A, MD  warfarin (COUMADIN) 2.5 MG tablet TAKE 1 TO 2 TABLETS BY MOUTH ONCE DAILY AS DIRECTED BY  COUMADIN  CLINIC Patient taking differently: Take 2.5-5 mg by mouth See admin instructions. 5mg  on [facility] and 2.5mg  on T SAT 05/04/17   Lelon Perla, MD    Family History Family History  Problem Relation Age of Onset  . Heart disease Mother   . Heart attack Mother   . Heart disease Father   . Heart attack Father   . Heart attack Brother   . Heart attack Sister   . Stroke Brother     Social History Social History   Tobacco Use  . Smoking status: Never Smoker  . Smokeless tobacco: Never Used  Substance Use Topics  . Alcohol use: No  . Drug use: No     Allergies   Prednisone; Procaine hcl; Levofloxacin; Oxycodone; Penicillins; and Tylenol [acetaminophen]   Review of Systems Review of Systems All other systems reviewed and are negative except that which was mentioned in HPI   Physical Exam Updated Vital Signs BP 120/75   Pulse 75   Resp (!) 22   SpO2 98%   Physical Exam  Constitutional: He is oriented to person, place, and time. He appears well-developed. No distress.  Frail but comfortable and well-appearing  HENT:  Head: Normocephalic and atraumatic.  Moist mucous membranes  Eyes: Conjunctivae are normal.  Neck: Neck supple.  Cardiovascular: Normal rate. An irregularly irregular rhythm present.  Murmur heard.  Systolic murmur is present. Pulmonary/Chest: Effort normal and breath sounds normal.  Abdominal: Soft. Bowel sounds are normal. He  exhibits no distension. There is no tenderness.  Genitourinary:  Genitourinary Comments: Foley catheter in place draining clear yellow urine  Musculoskeletal: He exhibits no edema.  Neurological: He is alert and oriented to person, place, and time.  Fluent speech  Skin: Skin is warm and dry.  Old skin tear and ecchymoses on R forearm, healing appropriately  Psychiatric: He has a normal mood and affect. Judgment normal.  Nursing note and vitals reviewed.    ED Treatments / Results  Labs (all labs ordered are listed, but only abnormal results are displayed) Labs Reviewed - No data to display  EKG  EKG Interpretation None       Radiology No results found.  Procedures Procedures (including critical care time)  Medications Ordered in ED Medications  bacitracin ointment 1  application (not administered)     Initial Impression / Assessment and Plan / ED Course  I have reviewed the triage vital signs and the nursing notes.      Sent from urology clinic for concern for A. fib with RVR.  Here he is in atrial fibrillation but rate is well controlled less than 100.  He denies any symptoms or complaints.  He has taken his medications appropriately today.  I reviewed his INR from yesterday which was therapeutic and his sodium was improving 128 yesterday.  He has follow-up appointment scheduled in the next few days.  Replaced skin tear bandage, gave leg bag for Foley catheter, and reviewed instructions.  Patient discharged in satisfactory condition.  Final Clinical Impressions(s) / ED Diagnoses   Final diagnoses:  None    ED Discharge Orders    None       Skie Vitrano, Wenda Overland, MD 05/24/17 1204

## 2017-05-29 ENCOUNTER — Ambulatory Visit: Payer: Medicare HMO | Admitting: Cardiology

## 2017-05-30 DIAGNOSIS — R3914 Feeling of incomplete bladder emptying: Secondary | ICD-10-CM | POA: Diagnosis not present

## 2017-05-31 NOTE — Progress Notes (Signed)
HPI: FU AS/MR, atrial fibrillation and CAD.Cardiac catheterization 2007 showed ejection fraction 50-55%, 100% mid to distal obtuse marginal. Patient had PCI of the OM at that time. 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 Admitted 12/18 with urinary retention which improved with foley cath. Also with hyponatremia. Then seen 05/24/17 with atrial fibrillation (HR felt to be elevated by urology but 100 upon eval in ER). Last echo 12/18 showed normal LV function, severe AS with mean gradient 44 mmHg, severe AI, severe LAE, flail MV with severe MS (mean gradient 11 mmHg) and severe MR, mild to moderate TR.Since last seen, Patient denies dyspnea, chest pain, palpitations or syncope.Occasional minimal pedal edema.  Current Outpatient Medications  Medication Sig Dispense Refill  . alfuzosin (UROXATRAL) 10 MG 24 hr tablet Take 10 mg by mouth daily with breakfast.    . cholecalciferol 2000 units TABS Take 1 tablet (2,000 Units total) by mouth daily. 30 tablet 0  . digoxin (LANOXIN) 0.125 MG tablet TAKE ONE TABLET BY MOUTH ONCE DAILY (Patient taking differently: TAKE ONE TABLET (118mcg) BY MOUTH ONCE DAILY) 90 tablet 3  . finasteride (PROSCAR) 5 MG tablet Take 1 tablet (5 mg total) by mouth daily.    . furosemide (LASIX) 20 MG tablet Take 1 tablet (20 mg total) by mouth daily as needed for fluid or edema (take one tablet a day  if your weight increases more than 2 pounds in 24 hours.). 30 tablet 0  . methocarbamol (ROBAXIN) 500 MG tablet Take 500 mg by mouth every 6 (six) hours as needed (cramps).     . metoprolol tartrate (LOPRESSOR) 25 MG tablet TAKE ONE TABLET BY MOUTH IN THE MORNING, THEN TAKE ONE-HALF IN THE EVENING (Patient taking differently: TAKE ONE TABLET (25mg ) BY MOUTH IN THE MORNING, THEN TAKE ONE-HALF (12.5mg ) IN THE EVENING) 135 tablet 3  . polyethylene glycol (MIRALAX / GLYCOLAX) packet Take 17 g by mouth daily as needed.     . potassium chloride  (K-DUR) 10 MEQ tablet Take 1 tablet (10 mEq total) by mouth daily as needed. Take potasium if you take lasix. 20 tablet 0  . warfarin (COUMADIN) 2.5 MG tablet TAKE 1 TO 2 TABLETS BY MOUTH ONCE DAILY AS DIRECTED BY  COUMADIN  CLINIC (Patient taking differently: Take 2.5-5 mg by mouth See admin instructions. 5mg  on [facility] and 2.5mg  on T SAT) 135 tablet 0   No current facility-administered medications for this visit.      Past Medical History:  Diagnosis Date  . Aortic stenosis   . Arthritis   . Atrial fibrillation (HCC)    Chronic  . BPH (benign prostatic hyperplasia)   . CAD (coronary artery disease)   . Chronic anticoagulation   . History of chicken pox   . HTN (hypertension)   . Mitral stenosis   . Old MI (myocardial infarction) 2007   s/p PCI to distal OM (no stent)  . 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 CORONARY ANGIOPLASTY  2007   Distal OM  . PROSTATE BIOPSY      Social History   Socioeconomic History  . Marital status: Married    Spouse name: Not on file  . Number of children: 3  . Years of education: 48  . Highest education level: Not on file  Social Needs  . Financial resource strain: Not on file  . Food  insecurity - worry: Not on file  . Food insecurity - inability: Not on file  . Transportation needs - medical: Not on file  . Transportation needs - non-medical: Not on file  Occupational History  . Not on file  Tobacco Use  . Smoking status: Never Smoker  . Smokeless tobacco: Never Used  Substance and Sexual Activity  . Alcohol use: No  . Drug use: No  . Sexual activity: Not on file  Other Topics Concern  . Not on file  Social History Narrative   Fun: Garden, work in the yard, anything physical and walk daily    Family History  Problem Relation Age of Onset  . Heart disease Mother   . Heart attack Mother   . Heart disease Father   . Heart attack Father   . Heart attack Brother   . Heart attack  Sister   . Stroke Brother     ROS: no fevers or chills, productive cough, hemoptysis, dysphasia, odynophagia, melena, hematochezia, dysuria, hematuria, rash, seizure activity, orthopnea, PND, pedal edema, claudication. Remaining systems are negative.  Physical Exam: Well-developed well-nourished in no acute distress.  Skin is warm and dry.  HEENT is normal.  Neck is supple.  Chest is clear to auscultation with normal expansion.  Cardiovascular exam is irregular, 3/6 systolic murmur left sternal border. Abdominal exam nontender or distended. No masses palpated. Extremities show no edema. neuro grossly intact    A/P  1 permanent atrial fibrillation-continue metoprolol and digoxin for rate control. Continue Coumadin with goal INR 2-3.  2 severe aortic stenosis-patient has severe aortic stenosis and severe mitral regurgitation on most recent echocardiogram. He had volume overload recently but this is felt secondary to urinary retention. He otherwise is not having symptoms including no dyspnea, chest pain or syncope. We will follow closely and if he develops symptoms plan referral for possible TAVR. Given age would like to avoid double valve surgery. Hopefully mitral regurgitation would improve with correction of aortic stenosis. Note mean gradient across mitral valve elevated likely at least partially related to mitral regurgitation.  3 coronary artery disease-patient is not on aspirin given need for Coumadin. He has declined statins previously.  4 hypertension-blood pressure is controlled. Continue present medications.  5 edema-improved following correction of urinary retention.  6 hyponatremia-repeat BMET  Kirk Ruths, MD

## 2017-06-01 ENCOUNTER — Ambulatory Visit (INDEPENDENT_AMBULATORY_CARE_PROVIDER_SITE_OTHER): Payer: Medicare HMO | Admitting: Pharmacist Clinician (PhC)/ Clinical Pharmacy Specialist

## 2017-06-01 ENCOUNTER — Encounter: Payer: Self-pay | Admitting: Cardiology

## 2017-06-01 ENCOUNTER — Ambulatory Visit: Payer: Medicare HMO | Admitting: Cardiology

## 2017-06-01 VITALS — BP 128/64 | HR 72 | Ht 71.0 in | Wt 170.0 lb

## 2017-06-01 DIAGNOSIS — I2510 Atherosclerotic heart disease of native coronary artery without angina pectoris: Secondary | ICD-10-CM | POA: Diagnosis not present

## 2017-06-01 DIAGNOSIS — I614 Nontraumatic intracerebral hemorrhage in cerebellum: Secondary | ICD-10-CM

## 2017-06-01 DIAGNOSIS — I482 Chronic atrial fibrillation, unspecified: Secondary | ICD-10-CM

## 2017-06-01 DIAGNOSIS — I10 Essential (primary) hypertension: Secondary | ICD-10-CM | POA: Diagnosis not present

## 2017-06-01 DIAGNOSIS — Z5181 Encounter for therapeutic drug level monitoring: Secondary | ICD-10-CM

## 2017-06-01 DIAGNOSIS — I350 Nonrheumatic aortic (valve) stenosis: Secondary | ICD-10-CM

## 2017-06-01 DIAGNOSIS — I5033 Acute on chronic diastolic (congestive) heart failure: Secondary | ICD-10-CM

## 2017-06-01 LAB — POCT INR: INR: 3.3

## 2017-06-01 NOTE — Patient Instructions (Addendum)
Lab work today  ( bmet )   Your physician recommends that you schedule a follow-up appointment with Dr.Crenshaw  Thursday  08/23/17 at 11:20 am

## 2017-06-02 LAB — BASIC METABOLIC PANEL
BUN/Creatinine Ratio: 15 (ref 10–24)
BUN: 13 mg/dL (ref 8–27)
CO2: 21 mmol/L (ref 20–29)
CREATININE: 0.85 mg/dL (ref 0.76–1.27)
Calcium: 8.7 mg/dL (ref 8.6–10.2)
Chloride: 91 mmol/L — ABNORMAL LOW (ref 96–106)
GFR calc Af Amer: 92 mL/min/{1.73_m2} (ref >59)
GFR calc non Af Amer: 79 mL/min/{1.73_m2} (ref >59)
GLUCOSE: 98 mg/dL (ref 65–99)
POTASSIUM: 5 mmol/L (ref 3.5–5.2)
SODIUM: 127 mmol/L — AB (ref 134–144)

## 2017-06-05 ENCOUNTER — Telehealth: Payer: Self-pay | Admitting: *Deleted

## 2017-06-05 DIAGNOSIS — E871 Hypo-osmolality and hyponatremia: Secondary | ICD-10-CM

## 2017-06-05 NOTE — Telephone Encounter (Addendum)
-----   Message from Lelon Perla, MD sent at 06/02/2017  8:57 AM EST ----- Fluid restrict to 1 liter daily; bmet 2 weeks Kirk Ruths   Left message for pt to call

## 2017-06-06 DIAGNOSIS — R3914 Feeling of incomplete bladder emptying: Secondary | ICD-10-CM | POA: Diagnosis not present

## 2017-06-07 NOTE — Telephone Encounter (Signed)
F/u message ° °Pt returning RN call  °

## 2017-06-07 NOTE — Telephone Encounter (Signed)
Spoke with pt wife, aware of fluid restrictions. Aware to keep medications the same. Lab orders mailed to the pt

## 2017-06-09 DIAGNOSIS — I110 Hypertensive heart disease with heart failure: Secondary | ICD-10-CM | POA: Diagnosis not present

## 2017-06-09 DIAGNOSIS — N401 Enlarged prostate with lower urinary tract symptoms: Secondary | ICD-10-CM | POA: Diagnosis not present

## 2017-06-09 DIAGNOSIS — E871 Hypo-osmolality and hyponatremia: Secondary | ICD-10-CM | POA: Diagnosis not present

## 2017-06-09 DIAGNOSIS — I69398 Other sequelae of cerebral infarction: Secondary | ICD-10-CM | POA: Diagnosis not present

## 2017-06-09 DIAGNOSIS — R339 Retention of urine, unspecified: Secondary | ICD-10-CM | POA: Diagnosis not present

## 2017-06-09 DIAGNOSIS — I2510 Atherosclerotic heart disease of native coronary artery without angina pectoris: Secondary | ICD-10-CM | POA: Diagnosis not present

## 2017-06-09 DIAGNOSIS — I5033 Acute on chronic diastolic (congestive) heart failure: Secondary | ICD-10-CM | POA: Diagnosis not present

## 2017-06-09 DIAGNOSIS — I350 Nonrheumatic aortic (valve) stenosis: Secondary | ICD-10-CM | POA: Diagnosis not present

## 2017-06-09 DIAGNOSIS — I482 Chronic atrial fibrillation: Secondary | ICD-10-CM | POA: Diagnosis not present

## 2017-06-09 DIAGNOSIS — M6281 Muscle weakness (generalized): Secondary | ICD-10-CM | POA: Diagnosis not present

## 2017-06-13 DIAGNOSIS — I482 Chronic atrial fibrillation: Secondary | ICD-10-CM | POA: Diagnosis not present

## 2017-06-13 DIAGNOSIS — I69398 Other sequelae of cerebral infarction: Secondary | ICD-10-CM | POA: Diagnosis not present

## 2017-06-13 DIAGNOSIS — I350 Nonrheumatic aortic (valve) stenosis: Secondary | ICD-10-CM | POA: Diagnosis not present

## 2017-06-13 DIAGNOSIS — I110 Hypertensive heart disease with heart failure: Secondary | ICD-10-CM | POA: Diagnosis not present

## 2017-06-13 DIAGNOSIS — R339 Retention of urine, unspecified: Secondary | ICD-10-CM | POA: Diagnosis not present

## 2017-06-13 DIAGNOSIS — N401 Enlarged prostate with lower urinary tract symptoms: Secondary | ICD-10-CM | POA: Diagnosis not present

## 2017-06-13 DIAGNOSIS — E871 Hypo-osmolality and hyponatremia: Secondary | ICD-10-CM | POA: Diagnosis not present

## 2017-06-13 DIAGNOSIS — I5033 Acute on chronic diastolic (congestive) heart failure: Secondary | ICD-10-CM | POA: Diagnosis not present

## 2017-06-13 DIAGNOSIS — M6281 Muscle weakness (generalized): Secondary | ICD-10-CM | POA: Diagnosis not present

## 2017-06-13 DIAGNOSIS — I2510 Atherosclerotic heart disease of native coronary artery without angina pectoris: Secondary | ICD-10-CM | POA: Diagnosis not present

## 2017-06-14 ENCOUNTER — Ambulatory Visit (INDEPENDENT_AMBULATORY_CARE_PROVIDER_SITE_OTHER): Payer: Medicare HMO | Admitting: Family Medicine

## 2017-06-14 ENCOUNTER — Encounter: Payer: Self-pay | Admitting: Family Medicine

## 2017-06-14 VITALS — BP 119/77 | HR 78 | Temp 97.5°F | Resp 16 | Ht 71.0 in | Wt 176.9 lb

## 2017-06-14 DIAGNOSIS — Z7901 Long term (current) use of anticoagulants: Secondary | ICD-10-CM

## 2017-06-14 DIAGNOSIS — I509 Heart failure, unspecified: Secondary | ICD-10-CM | POA: Diagnosis not present

## 2017-06-14 DIAGNOSIS — E871 Hypo-osmolality and hyponatremia: Secondary | ICD-10-CM

## 2017-06-14 DIAGNOSIS — I5033 Acute on chronic diastolic (congestive) heart failure: Secondary | ICD-10-CM | POA: Diagnosis not present

## 2017-06-14 DIAGNOSIS — N138 Other obstructive and reflux uropathy: Secondary | ICD-10-CM | POA: Diagnosis not present

## 2017-06-14 DIAGNOSIS — N401 Enlarged prostate with lower urinary tract symptoms: Secondary | ICD-10-CM

## 2017-06-14 DIAGNOSIS — Z09 Encounter for follow-up examination after completed treatment for conditions other than malignant neoplasm: Secondary | ICD-10-CM

## 2017-06-14 DIAGNOSIS — R339 Retention of urine, unspecified: Secondary | ICD-10-CM

## 2017-06-14 NOTE — Progress Notes (Addendum)
Impression and Recommendations:    1. Urinary retention   2. Hospital discharge follow-up   3. Acute on chronic congestive heart failure, unspecified heart failure type ([place])   4. Hyponatremia   5. Chronic anticoagulation   6. BPH with obstruction/lower urinary tract symptoms   7. Acute on chronic diastolic CHF (congestive heart failure) ([place])     1. Patient no longer with urinary retention symptoms.  Highly encouraged to make sure he follows up with his urologist every 6-12 months or as told to do by them 2. Discussed heart valve problems and referred patient to follow recommendations of Dr. Stanford Breed, his cardiologist.  Explained to patient that a chronic productive cough can be a symptom of his CHF and since his heart is worsening, this is likely reason for his symptoms.  Suggest he discuss this further with his cardiologist in follow-up or sooner if W.   3. Discussed pain management and procedures regarding back and radicular pain -explained to patient he might want to talk to Dr. Gladstone Lighter, his orthopedic surgeon about further procedures such as spinal nerve stimulator placement, epidural steroids etc that possibly can be done to help with his pain.  Recommend he take the pain meds as prescribed by Dr. Gladstone Lighter if he is that uncomfortable.  We discussed possible side effects of constipation with narcotics and ways to help prevent that. 4. Patient is getting his sodium rechecked at his cardiologist's office in 2 weeks per the wife.   They are continuing to treat patient for his heart valve and congestive heart failure with diuretics and I explained how this can affect sodium levels.  Highly encouraged patient to make those regular follow-up visits with his cardiologist and the importance of continuing to monitor this as an outpatient. 5. Patient continues to go to the [place] and see his PCP there every 6 months.  as well as his urologist and cardiologist every 6 months.   Gross side effects,  risk and benefits, and alternatives of medications and treatment plan in general discussed with patient.  Patient is aware that all medications have potential side effects and we are unable to predict every side effect or drug-drug interaction that may occur.   Patient will call with any questions prior to using medication if they have concerns.  Expresses verbal understanding and consents to current therapy and treatment regimen.  No barriers to understanding were identified.  Red flag symptoms and signs discussed in detail.  Patient expressed understanding regarding what to do in case of emergency\urgent symptoms  Please see AVS handed out to patient at the end of our visit for further patient instructions/ counseling done pertaining to today's office visit.   Return in about 4 months (around 10/13/2017).    Note: This note was prepared with assistance of Dragon voice recognition software. Occasional wrong-word or sound-a-like substitutions may have occurred due to the inherent limitations of voice recognition software.    This document serves as a record of services personally performed by [name], DO. It was created on her behalf by Mayer Masker, a trained medical scribe. The creation of this record is based on the scribe's personal observations and the provider's statements to them.   I have reviewed the above documentation for accuracy and completeness, and I agree with the above.   [name] 06/14/17 5:58 PM  ------------------------------------------------------------------------------------------------------------------------------------   Subjective:     HPI: Wayne [name] is a 83 y.o. male with PMHx of spinal stenosis,  CHF, Afib, valvular issues, and bowel/bladder incontinence who presents to [facility] today for follow-up of his recent hospitalization and also his recent ED visit.   "ED visit note from 05/24/2017: HPI:  83 year old male with extensive past medical history including CHF, aortic and mitral stenosis, atrial fibrillation on Coumadin who presents with tachycardia.   I admitted the patient to the hospital on 12/2 for CHF exacerbation and hyponatremia.  He was discharged yesterday after improvement in his sodium and diuresis.  He also had urinary retention at presentation and Foley catheter was placed.  He went to the urology clinic today to be switched to a leg bag and was incidentally noted to have heart rate in the 160s, they thought he was in A. fib with RVR so they sent him to the ED for evaluation.  He denies any new complaints whatsoever including no chest pain, shortness of breath, or heart racing sensation. He is taking all of his morning medications as prescribed.  He feels well and wants to go home.  Plan: Sent from urology clinic for concern for A. fib with RVR.  Here he is in atrial fibrillation but rate is well controlled less than 100.   He denies any symptoms or complaints.  He has taken his medications appropriately today.  I reviewed his INR from yesterday which was therapeutic and his sodium was improving 128 yesterday.  He has follow-up appointment scheduled in the next few days with Cardiology.  Replaced skin tear bandage, gave leg bag for Foley catheter, and reviewed instructions. Patient discharged in satisfactory condition. "   ED to hospital admission from 05/20/2017: "Admit date: 05/20/2017 Discharge date: 05/23/2017  Admitted From: home  Disposition:  Home   Recommendations for Outpatient Follow-up:  1. Follow up with PCP in 1-2 weeks 2. Please obtain BMP/CBC in one week 3. Follow up with urology for voiding trial.  4. Follow with cardiology, monitor weight, assess volume status, adjust lasix and further discussion regarding valve replacement.   Home Health: yes.   Discharge Condition: stable.  CODE STATUS: full code.  Diet recommendation: Heart Healthy   Brief/Interim  Summary: 83 year old Caucasian male with a past medical history of stroke with residual weakness, chronic atrial fibrillation, severe aortic stenosis, moderate mitral regurgitation presented with complaints of weakness and difficulty urinating. He was found to have urinary retention and a Foley catheter had to be placed. He also mentioned weight gain and lower extremity edema. He was found to have hyponatremia. He was hospitalized for further management.   Hyponatremia Considering pedal edema and evidence for vascular congestion on chest x-ray this is most likely hypervolemic in etiology.Patient was given Lasix at the time of admission with improvement in sodium level to 126 from 122.  His hyponatremia improved with l;asix. Reviewing records he has chronic hyponatremia sodium range from 128 to 131.  Stable.    Acute on chronic diastolic CHF Patient noted to have lower extremity edema/weight gain/abnormal chest x-ray. Patient was given Lasix at admission and on 12-05.  . Echocardiogram report as above. Systolic function is normal. his weight has decreased from 174 to 167./  He is feeling better. He will be discharge on 20 mg lasix, with instruction to take lasix if his weight increases more than 2 pounds in 24 hours.  Close follow up with cardiology   Severe aortic stenosis and moderate to severe mitral regurgitation Management per cardiology.Pleasesee their note for details. Further out patient discussion regarding sx.  History of chronic atrial fibrillation Stable. Continue home medications including digoxin and metoprolol. He is anticoagulated with warfarin which will be continued per pharmacy. resume home dose.   Urinary retention in a patient with known history of BPH Foley catheter had to be placed for urinary retention. Continue for now. Start mobilizing. Discussed in detail with patient's daughter. Will prefer that this be addressed as an outpatient inhis  urologist office. He is followed by Dr. Karsten Ro. Continue finasteride and Uroxatral. Will need to be discharged with Foley and leg bag.  urology consulted, they agree with Dr Maryland Pink, out patient follow up. discharge with foley. Follow up in the office.   Generalized weakness/fatigue Most likely due to all of the above issues. PT and OT evaluation. UAdid not show any acute findings.  home health PT.   Previous history of stroke Stable.  Normocytic anemia Probably due to chronic kidney disease. No evidence of overt bleeding. Continue to monitor.   Discharge Diagnoses:  Principal Problem:   Hyponatremia Active Problems:   Chronic atrial fibrillation (HCC)   Acute on chronic diastolic CHF (congestive heart failure) (H[facility])   Aortic stenosis   Urinary retention  Discharge Instructions      Discharge Instructions    Diet - low sodium heart healthy   Complete by:  As directed    Increase activity slowly   Complete by:  As directed   "   Heart:  Pt states he hears a sound in his ears between 5-6 am when his heart beats fast that has worsened in the last few weeks. He says he has palpitations but his heart is "not racing".  He states, however, that sleeping on his right side is worse than his left, but his symptoms are not worsened by positional changes or movement. He says he is unable to go back to sleep when this occurs, and because of this he is bothered by it.   Today He denies SOB, dizziness, increasing productive cough, orthopnea, paroxysmal nocturnal dyspnea, changes to appetite, CP, hematuria, abdominal pain, and urinary retention.    Back: He sees Dr. Gladstone Lighter, his orthopedic surgeon, for his back pain meds.    Wt Readings from Last 3 Encounters:  06/14/17 176 lb 14.4 oz (80.2 kg)  06/01/17 170 lb (77.1 kg)  05/23/17 167 lb 1.7 oz (75.8 kg)   BP Readings from Last 3 Encounters:  06/14/17 119/77  06/01/17 128/64  05/24/17 112/73   Pulse  Readings from Last 3 Encounters:  06/14/17 78  06/01/17 72  05/24/17 84   BMI Readings from Last 3 Encounters:  06/14/17 24.67 kg/m  06/01/17 23.71 kg/m  05/23/17 23.64 kg/m     Patient Care Team    Relationship Specialty Notifications Start End  [name], DO PCP - General Family Medicine  10/23/16   Lelon Perla, MD Consulting Physician Cardiology  10/23/16   Jarome Matin, MD Consulting Physician Dermatology  10/23/16   Kathie Rhodes, MD Consulting Physician Urology  10/23/16   Latanya Maudlin, MD Consulting Physician Orthopedic Surgery  11/16/16    Comment: back pain     Patient Active Problem List   Diagnosis Date Noted  . Vitamin D deficiency 11/16/2016    Priority: High  . H/O non anemic vitamin B12 deficiency 11/16/2016    Priority: High  . Mild Colonic dysmotility 11/16/2016    Priority: High  . Chronic atrial fibrillation ([place]) 09/13/2010    Priority: High  . Chronic anticoagulation     Priority:  Medium  . HTN (hypertension)     Priority: Medium  . CAD (coronary artery disease)     Priority: Medium  . h/o CVA (cerebrovascular accident due to intracerebral hemorrhage) 09/13/2010    Priority: Medium  . BPH with obstruction/lower urinary tract symptoms 09/13/2016    Priority: Low  . Acute on chronic diastolic CHF (congestive heart failure) ([place]) 05/20/2017  . Hyponatremia 05/20/2017  . Aortic stenosis 05/20/2017  . Urinary retention   . 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 Spinal stenosis at L4-L5 level 04/26/2017  . Lumbar radiculopathy, chronic 04/26/2017  . Benign colonic polyp- 25 yrs ago.  11/16/2016  . Hypokalemia 09/13/2016  . Urinary frequency 07/10/2016  . Left bundle branch block (LBBB) on electrocardiogram 06/30/2016  . History of stroke 06/30/2016  . Generalized weakness 06/30/2016  . Osteoarthritis 04/04/2016  . Encounter for therapeutic drug monitoring 08/06/2013    Past Medical history,  Surgical history, Family history, Social history, Allergies and Medications have been entered into the medical record, reviewed and changed as needed.    Current Meds  Medication Sig  . alfuzosin (UROXATRAL) 10 MG 24 hr tablet Take 10 mg by mouth daily with breakfast.  . cholecalciferol 2000 units TABS Take 1 tablet (2,000 Units total) by mouth daily.  . digoxin (LANOXIN) 0.125 MG tablet TAKE ONE TABLET BY MOUTH ONCE DAILY (Patient taking differently: TAKE ONE TABLET (152mcg) BY MOUTH ONCE DAILY)  . finasteride (PROSCAR) 5 MG tablet Take 1 tablet (5 mg total) by mouth daily.  . furosemide (LASIX) 20 MG tablet Take 1 tablet (20 mg total) by mouth daily as needed for fluid or edema (take one tablet a day  if your weight increases more than 2 pounds in 24 hours.).  Marland Kitchen methocarbamol (ROBAXIN) 500 MG tablet Take 500 mg by mouth every 6 (six) hours as needed (cramps).   . metoprolol tartrate (LOPRESSOR) 25 MG tablet TAKE ONE TABLET BY MOUTH IN THE MORNING, THEN TAKE ONE-HALF IN THE EVENING (Patient taking differently: TAKE ONE TABLET (25mg ) BY MOUTH IN THE MORNING, THEN TAKE ONE-HALF (12.5mg ) IN THE EVENING)  . polyethylene glycol (MIRALAX / GLYCOLAX) packet Take 17 g by mouth daily as needed.   . potassium chloride (K-DUR) 10 MEQ tablet Take 1 tablet (10 mEq total) by mouth daily as needed. Take potasium if you take lasix.  Marland Kitchen warfarin (COUMADIN) 2.5 MG tablet TAKE 1 TO 2 TABLETS BY MOUTH ONCE DAILY AS DIRECTED BY  COUMADIN  CLINIC (Patient taking differently: Take 2.5-5 mg by mouth See admin instructions. 5mg  on S M [facility] TH F and 2.5mg  on T SAT)    Allergies:  Allergies  Allergen Reactions  . Prednisone Other (See Comments)    Wheezing  . Procaine Hcl Other (See Comments)    Passed out after being given this at dental appointment  . Levofloxacin Rash  . Oxycodone Other (See Comments)    constipation  . Penicillins Rash    Has patient had a PCN reaction causing immediate rash, facial/tongue/throat  swelling, SOB or lightheadedness with hypotension: Yes Has patient had a PCN reaction causing severe rash involving mucus membranes or skin necrosis: No Has patient had a PCN reaction that required hospitalization No Has patient had a PCN reaction occurring within the last 10 years: No If all of the above answers are "NO", then may proceed with Cephalosporin use.  . Tylenol [Acetaminophen] Other (See Comments)    Makes him very sleepy if he  takes more than one tablet    Review of Systems:  A fourteen system review of systems was performed and found to be positive as per HPI.   Objective:   Blood pressure 119/77, pulse 78, temperature (!) 97.5 F (36.4 C), temperature source Oral, resp. rate 16, height 5\' 11"  (1.803 m), weight 176 lb 14.4 oz (80.2 kg), SpO2 94 %. Body mass index is 24.67 kg/m. General: appropriate for stated age.  Neuro:  Alert and oriented,  extra-ocular muscles intact  HEENT:  Normocephalic, atraumatic, neck supple, no carotid bruits appreciated, no JVD Skin:  no gross rash, warm, pink. Cardiac:   S1 S2, extensive murmurs heard at all upper and lower heart borders c/w his valvular d/o. Respiratory:  Distant but ECTA B/L and A/P, Not using accessory muscles, speaking in full sentences- unlabored. Vascular:  Ext warm, no cyanosis apprec; cap RF less 2 sec. Psych:  Euthymic mood. Full Affect.

## 2017-06-14 NOTE — Patient Instructions (Addendum)
When you follow-up with Dr. Gladstone Lighter regarding your spinal stenosis and radicular pain in your lower extremities, ask him about if any procedures can be done to help alleviate the pain such as epidural injections, transforaminal steroid injections\facet or even TENS unit placement.   I recommend you take the pain medicines per Dr. Charlestine Night recommendations for your spinal stenosis.  CBD oil has not been proven to help but certainly you can give it a try if you would like.

## 2017-06-15 ENCOUNTER — Telehealth: Payer: Self-pay | Admitting: Cardiology

## 2017-06-15 NOTE — Telephone Encounter (Signed)
Please call,concerning his medicine.He wants to switch from Ramer to Eliquis. Marland Kitchen

## 2017-06-15 NOTE — Telephone Encounter (Signed)
Spoke with wife. She is wanting to switch to Eliquis for Wayne [name].  He has INR check scheduled for January 4, so we can make the conversion at that time.   She is worried about getting approval from Dr. Stanford Breed for this, however in his OV note from August he mentions that patient not interested in switching to NOAC at that time.  Will forward as FYI to MD, plan to make change next Friday.

## 2017-06-15 NOTE — Telephone Encounter (Signed)
Routed to pharmD for discussion of med options.

## 2017-06-18 NOTE — Telephone Encounter (Signed)
Ok to change to apixaban 5 BID at ov as outlined Kirk Ruths

## 2017-06-19 DIAGNOSIS — I5033 Acute on chronic diastolic (congestive) heart failure: Secondary | ICD-10-CM | POA: Diagnosis not present

## 2017-06-19 DIAGNOSIS — I482 Chronic atrial fibrillation: Secondary | ICD-10-CM | POA: Diagnosis not present

## 2017-06-19 DIAGNOSIS — I69398 Other sequelae of cerebral infarction: Secondary | ICD-10-CM | POA: Diagnosis not present

## 2017-06-19 DIAGNOSIS — I350 Nonrheumatic aortic (valve) stenosis: Secondary | ICD-10-CM | POA: Diagnosis not present

## 2017-06-19 DIAGNOSIS — I2510 Atherosclerotic heart disease of native coronary artery without angina pectoris: Secondary | ICD-10-CM | POA: Diagnosis not present

## 2017-06-19 DIAGNOSIS — M6281 Muscle weakness (generalized): Secondary | ICD-10-CM | POA: Diagnosis not present

## 2017-06-19 DIAGNOSIS — R339 Retention of urine, unspecified: Secondary | ICD-10-CM | POA: Diagnosis not present

## 2017-06-19 DIAGNOSIS — E871 Hypo-osmolality and hyponatremia: Secondary | ICD-10-CM | POA: Diagnosis not present

## 2017-06-19 DIAGNOSIS — I110 Hypertensive heart disease with heart failure: Secondary | ICD-10-CM | POA: Diagnosis not present

## 2017-06-19 DIAGNOSIS — N401 Enlarged prostate with lower urinary tract symptoms: Secondary | ICD-10-CM | POA: Diagnosis not present

## 2017-06-21 DIAGNOSIS — E871 Hypo-osmolality and hyponatremia: Secondary | ICD-10-CM | POA: Diagnosis not present

## 2017-06-21 DIAGNOSIS — R339 Retention of urine, unspecified: Secondary | ICD-10-CM | POA: Diagnosis not present

## 2017-06-21 DIAGNOSIS — I5033 Acute on chronic diastolic (congestive) heart failure: Secondary | ICD-10-CM | POA: Diagnosis not present

## 2017-06-21 DIAGNOSIS — N401 Enlarged prostate with lower urinary tract symptoms: Secondary | ICD-10-CM | POA: Diagnosis not present

## 2017-06-21 DIAGNOSIS — M6281 Muscle weakness (generalized): Secondary | ICD-10-CM | POA: Diagnosis not present

## 2017-06-21 DIAGNOSIS — I110 Hypertensive heart disease with heart failure: Secondary | ICD-10-CM | POA: Diagnosis not present

## 2017-06-21 DIAGNOSIS — I2510 Atherosclerotic heart disease of native coronary artery without angina pectoris: Secondary | ICD-10-CM | POA: Diagnosis not present

## 2017-06-21 DIAGNOSIS — I350 Nonrheumatic aortic (valve) stenosis: Secondary | ICD-10-CM | POA: Diagnosis not present

## 2017-06-21 DIAGNOSIS — I482 Chronic atrial fibrillation: Secondary | ICD-10-CM | POA: Diagnosis not present

## 2017-06-21 DIAGNOSIS — I69398 Other sequelae of cerebral infarction: Secondary | ICD-10-CM | POA: Diagnosis not present

## 2017-06-22 ENCOUNTER — Ambulatory Visit (INDEPENDENT_AMBULATORY_CARE_PROVIDER_SITE_OTHER): Payer: Medicare HMO | Admitting: Pharmacist

## 2017-06-22 DIAGNOSIS — E871 Hypo-osmolality and hyponatremia: Secondary | ICD-10-CM | POA: Diagnosis not present

## 2017-06-22 DIAGNOSIS — I482 Chronic atrial fibrillation, unspecified: Secondary | ICD-10-CM

## 2017-06-22 DIAGNOSIS — Z5181 Encounter for therapeutic drug level monitoring: Secondary | ICD-10-CM | POA: Diagnosis not present

## 2017-06-22 LAB — POCT INR: INR: 2.2

## 2017-06-23 LAB — BASIC METABOLIC PANEL
BUN/Creatinine Ratio: 16 (ref 10–24)
BUN: 13 mg/dL (ref 8–27)
CHLORIDE: 93 mmol/L — AB (ref 96–106)
CO2: 22 mmol/L (ref 20–29)
CREATININE: 0.82 mg/dL (ref 0.76–1.27)
Calcium: 9 mg/dL (ref 8.6–10.2)
GFR calc Af Amer: 93 mL/min/{1.73_m2} (ref >59)
GFR calc non Af Amer: 81 mL/min/{1.73_m2} (ref >59)
Glucose: 93 mg/dL (ref 65–99)
Potassium: 4.1 mmol/L (ref 3.5–5.2)
SODIUM: 132 mmol/L — AB (ref 134–144)

## 2017-06-25 ENCOUNTER — Telehealth: Payer: Self-pay | Admitting: Cardiology

## 2017-06-25 ENCOUNTER — Other Ambulatory Visit: Payer: Self-pay

## 2017-06-25 MED ORDER — FUROSEMIDE 20 MG PO TABS: 20.0000 mg | ORAL_TABLET | Freq: Every day | ORAL | 2 refills | Status: DC | PRN

## 2017-06-25 MED ORDER — POTASSIUM CHLORIDE ER 10 MEQ PO TBCR: 10.0000 meq | EXTENDED_RELEASE_TABLET | Freq: Every day | ORAL | 2 refills | Status: DC | PRN

## 2017-06-25 NOTE — Telephone Encounter (Signed)
New message   *STAT* If patient is at the pharmacy, call can be transferred to refill team.   1. Which medications need to be refilled? (please list name of each medication and dose if known) furosemide (LASIX) 20 MG tablet  2. Which pharmacy/location (including street and city if local pharmacy) is medication to be sent to? [facility] on [address]   [phone]   3. Do they need a 30 day or 90 day supply?     *STAT* If patient is at the pharmacy, call can be transferred to refill team.   1. Which medications need to be refilled? (please list name of each medication and dose if known) potassium chloride (K-DUR) 10 MEQ tablet  2. Which pharmacy/location (including street and city if local pharmacy) is medication to be sent to? [facility] on [address]   [phone]  3. Do they need a 30 day or 90 day supply?

## 2017-06-26 DIAGNOSIS — R339 Retention of urine, unspecified: Secondary | ICD-10-CM | POA: Diagnosis not present

## 2017-06-26 DIAGNOSIS — I350 Nonrheumatic aortic (valve) stenosis: Secondary | ICD-10-CM | POA: Diagnosis not present

## 2017-06-26 DIAGNOSIS — E871 Hypo-osmolality and hyponatremia: Secondary | ICD-10-CM | POA: Diagnosis not present

## 2017-06-26 DIAGNOSIS — I110 Hypertensive heart disease with heart failure: Secondary | ICD-10-CM | POA: Diagnosis not present

## 2017-06-26 DIAGNOSIS — M6281 Muscle weakness (generalized): Secondary | ICD-10-CM | POA: Diagnosis not present

## 2017-06-26 DIAGNOSIS — I2510 Atherosclerotic heart disease of native coronary artery without angina pectoris: Secondary | ICD-10-CM | POA: Diagnosis not present

## 2017-06-26 DIAGNOSIS — I482 Chronic atrial fibrillation: Secondary | ICD-10-CM | POA: Diagnosis not present

## 2017-06-26 DIAGNOSIS — N401 Enlarged prostate with lower urinary tract symptoms: Secondary | ICD-10-CM | POA: Diagnosis not present

## 2017-06-26 DIAGNOSIS — I69398 Other sequelae of cerebral infarction: Secondary | ICD-10-CM | POA: Diagnosis not present

## 2017-06-26 DIAGNOSIS — I5033 Acute on chronic diastolic (congestive) heart failure: Secondary | ICD-10-CM | POA: Diagnosis not present

## 2017-06-28 ENCOUNTER — Telehealth: Payer: Self-pay | Admitting: Cardiology

## 2017-06-28 DIAGNOSIS — R339 Retention of urine, unspecified: Secondary | ICD-10-CM | POA: Diagnosis not present

## 2017-06-28 DIAGNOSIS — I5033 Acute on chronic diastolic (congestive) heart failure: Secondary | ICD-10-CM | POA: Diagnosis not present

## 2017-06-28 DIAGNOSIS — I69398 Other sequelae of cerebral infarction: Secondary | ICD-10-CM | POA: Diagnosis not present

## 2017-06-28 DIAGNOSIS — I110 Hypertensive heart disease with heart failure: Secondary | ICD-10-CM | POA: Diagnosis not present

## 2017-06-28 DIAGNOSIS — M6281 Muscle weakness (generalized): Secondary | ICD-10-CM | POA: Diagnosis not present

## 2017-06-28 DIAGNOSIS — I2510 Atherosclerotic heart disease of native coronary artery without angina pectoris: Secondary | ICD-10-CM | POA: Diagnosis not present

## 2017-06-28 DIAGNOSIS — N401 Enlarged prostate with lower urinary tract symptoms: Secondary | ICD-10-CM | POA: Diagnosis not present

## 2017-06-28 DIAGNOSIS — I350 Nonrheumatic aortic (valve) stenosis: Secondary | ICD-10-CM | POA: Diagnosis not present

## 2017-06-28 DIAGNOSIS — E871 Hypo-osmolality and hyponatremia: Secondary | ICD-10-CM | POA: Diagnosis not present

## 2017-06-28 DIAGNOSIS — I482 Chronic atrial fibrillation: Secondary | ICD-10-CM | POA: Diagnosis not present

## 2017-06-28 MED ORDER — FUROSEMIDE 20 MG PO TABS: 20.0000 mg | ORAL_TABLET | ORAL | 6 refills | Status: DC | PRN

## 2017-06-28 NOTE — Telephone Encounter (Signed)
Pt says he did not receive his Lasix which was sent in on 06-25-17.Would you please resend this morning to [phone].

## 2017-06-28 NOTE — Telephone Encounter (Signed)
rx had already been called into pharmacy

## 2017-06-28 NOTE — Telephone Encounter (Signed)
Spoke with pt wife(DPR) about his blood work, also pt stated the rx for lasix was not send it, reviewed and rx was printed because of long sig, rx for lasix was called into pt pharmacy, pt made aware.

## 2017-06-28 NOTE — Telephone Encounter (Signed)
Pt would like his lab results about his low sodium from last week.He wants to know what he needs to do about it.

## 2017-06-29 NOTE — Telephone Encounter (Signed)
Received a call from patient he stated he has been getting up 7 to 8 times every night with fast heart beat and sob.Stated last night it took longer for heart beat to slow down and sob worse.Stated he feels better at present.Requested appointment to be seen.No extender appointments available.Spoke to DOD Dr.Berry his schedule is full.Appointment schedule with Rosaria Ferries PA Mon 07/02/17 at 8:30 am.Advised to go to ED if needed.

## 2017-06-29 NOTE — Telephone Encounter (Signed)
Pt c/o Shortness Of Breath: STAT if SOB developed within the last 24 hours or pt is noticeably SOB on the phone  1. Are you currently SOB (can you hear that pt is SOB on the phone)? yes 2. How long have you been experiencing SOB? Last night   3. Are you SOB when sitting or when up moving around? Both   4. Are you currently experiencing any other symptoms? no

## 2017-07-02 ENCOUNTER — Emergency Department (HOSPITAL_COMMUNITY)
Admission: EM | Admit: 2017-07-02 | Discharge: 2017-07-02 | Disposition: A | Discharge status: Home / Self Care | Payer: Medicare HMO | Attending: Emergency Medicine | Admitting: Emergency Medicine

## 2017-07-02 ENCOUNTER — Other Ambulatory Visit: Payer: Self-pay

## 2017-07-02 ENCOUNTER — Emergency Department (HOSPITAL_COMMUNITY): Payer: Medicare HMO

## 2017-07-02 ENCOUNTER — Ambulatory Visit: Payer: Medicare HMO | Admitting: Physician Assistant

## 2017-07-02 ENCOUNTER — Encounter (HOSPITAL_COMMUNITY): Payer: Self-pay

## 2017-07-02 DIAGNOSIS — R002 Palpitations: Secondary | ICD-10-CM | POA: Diagnosis not present

## 2017-07-02 DIAGNOSIS — I5033 Acute on chronic diastolic (congestive) heart failure: Secondary | ICD-10-CM | POA: Diagnosis not present

## 2017-07-02 DIAGNOSIS — I2510 Atherosclerotic heart disease of native coronary artery without angina pectoris: Secondary | ICD-10-CM | POA: Insufficient documentation

## 2017-07-02 DIAGNOSIS — Z79899 Other long term (current) drug therapy: Secondary | ICD-10-CM | POA: Insufficient documentation

## 2017-07-02 DIAGNOSIS — I110 Hypertensive heart disease with heart failure: Secondary | ICD-10-CM | POA: Diagnosis not present

## 2017-07-02 DIAGNOSIS — R0602 Shortness of breath: Secondary | ICD-10-CM | POA: Insufficient documentation

## 2017-07-02 DIAGNOSIS — J810 Acute pulmonary edema: Secondary | ICD-10-CM | POA: Diagnosis not present

## 2017-07-02 LAB — BASIC METABOLIC PANEL
ANION GAP: 9 (ref 5–15)
BUN: 13 mg/dL (ref 6–20)
CO2: 24 mmol/L (ref 22–32)
Calcium: 8.7 mg/dL — ABNORMAL LOW (ref 8.9–10.3)
Chloride: 97 mmol/L — ABNORMAL LOW (ref 101–111)
Creatinine, Ser: 0.78 mg/dL (ref 0.61–1.24)
GFR calc Af Amer: >60 mL/min (ref >60)
GFR calc non Af Amer: >60 mL/min (ref >60)
GLUCOSE: 101 mg/dL — AB (ref 65–99)
POTASSIUM: 4.3 mmol/L (ref 3.5–5.1)
Sodium: 130 mmol/L — ABNORMAL LOW (ref 135–145)

## 2017-07-02 LAB — CBC
HEMATOCRIT: 36.9 % — AB (ref 39.0–52.0)
HEMOGLOBIN: 11.9 g/dL — AB (ref 13.0–17.0)
MCH: 28.5 pg (ref 26.0–34.0)
MCHC: 32.2 g/dL (ref 30.0–36.0)
MCV: 88.3 fL (ref 78.0–100.0)
Platelets: 156 10*3/uL (ref 150–400)
RBC: 4.18 MIL/uL — ABNORMAL LOW (ref 4.22–5.81)
RDW: 18.2 % — ABNORMAL HIGH (ref 11.5–15.5)
WBC: 5.5 10*3/uL (ref 4.0–10.5)

## 2017-07-02 LAB — DIGOXIN LEVEL: DIGOXIN LVL: 0.5 ng/mL — AB (ref 0.8–2.0)

## 2017-07-02 LAB — PROTIME-INR
INR: 2.23
Prothrombin Time: 24.5 seconds — ABNORMAL HIGH (ref 11.4–15.2)

## 2017-07-02 LAB — I-STAT TROPONIN, ED: Troponin i, poc: 0.02 ng/mL (ref 0.00–0.08)

## 2017-07-02 LAB — BRAIN NATRIURETIC PEPTIDE: B NATRIURETIC PEPTIDE 5: 442.5 pg/mL — AB (ref 0.0–100.0)

## 2017-07-02 MED ORDER — METOPROLOL TARTRATE 25 MG PO TABS: ORAL_TABLET | ORAL | 3 refills | Status: DC

## 2017-07-02 NOTE — ED Triage Notes (Signed)
PT reports hx of AFIB and states he goes in and out throughout the day usually. PT states over the last 2 days he hs not converted out of afib and haas experienced increased sob and weakness. Denies CP.

## 2017-07-02 NOTE — ED Notes (Signed)
ED Provider at bedside. 

## 2017-07-02 NOTE — ED Notes (Signed)
PT IN XRAY

## 2017-07-02 NOTE — Discharge Instructions (Signed)
You were seen in the ED today with palpitations and difficulty breathing. We are increasing your Metoprolol dose to 25 mg twice daily after discussion with Dr. Marlou Porch. Call your Cardiologist tomorrow to schedule the soonest available follow up appointment.  Return to the ED with any chest pain, fever, chills, or worsening difficulty breathing. Continue to take the lasix for fluid and elevate the legs while seated at home.

## 2017-07-02 NOTE — ED Provider Notes (Signed)
Emergency Department Provider Note   I have reviewed the triage vital signs and the nursing notes.   HISTORY  Chief Complaint Shortness of Breath   HPI Wayne [name] is a 84 y.o. male with PMH of a-fib, CAD, HTN, AS, and prior CVA presents to the emergency department for evaluation of shortness of breath and heart palpitations.  The patient states that he feels like he is going in and out of A. fib with elevated heart rate.  He feels short of breath.  He states that he typically goes in and out and will stay in these episodes for several minutes.  Over the past 2 days he has had approximately 12 episodes of shortness of breath with palpitations.  He has not taken his pulse during these episodes but describes it as feeling like fast A. Fib.  He is not having symptoms during my evaluation.  States is worse when he stands up and walks.  He has been on digoxin, Lasix, and potassium as prescribed.  He is also been taking his Coumadin as prescribed.  No syncope, chest pain.  He has had some coughing that is nonproductive.  No hemoptysis.  Primary Cardiologist Dr. Stanford Breed.    Past Medical History:  Diagnosis Date  . Aortic stenosis   . Arthritis   . Atrial fibrillation (HCC)    Chronic  . BPH (benign prostatic hyperplasia)   . CAD (coronary artery disease)   . Chronic anticoagulation   . History of chicken pox   . HTN (hypertension)   . Mitral stenosis   . Old MI (myocardial infarction) 2007   s/p PCI to distal OM (no stent)  . Stroke, embolic [facility])     Patient Active Problem List   Diagnosis Date Noted  . Acute on chronic diastolic CHF (congestive heart failure) ([place]) 05/20/2017  . Hyponatremia 05/20/2017  . Aortic stenosis 05/20/2017  . Urinary retention   . Arthritis of facet joints at multiple vertebral levels ([place]) 04/26/2017  . Spinal stenosis at L4-L5 level 04/26/2017  . Lumbar radiculopathy, chronic 04/26/2017  . Benign colonic polyp- 25 yrs ago.  11/16/2016   . Vitamin D deficiency 11/16/2016  . H/O non anemic vitamin B12 deficiency 11/16/2016  . Mild Colonic dysmotility 11/16/2016  . Hypokalemia 09/13/2016  . BPH with obstruction/lower urinary tract symptoms 09/13/2016  . Urinary frequency 07/10/2016  . Left bundle branch block (LBBB) on electrocardiogram 06/30/2016  . History of stroke 06/30/2016  . Generalized weakness 06/30/2016  . Osteoarthritis 04/04/2016  . Encounter for therapeutic drug monitoring 08/06/2013  . Chronic anticoagulation   . HTN (hypertension)   . CAD (coronary artery disease)   . Chronic atrial fibrillation ([place]) 09/13/2010  . h/o CVA (cerebrovascular accident due to intracerebral hemorrhage) 09/13/2010    Past Surgical History:  Procedure Laterality Date  . CORONARY ANGIOPLASTY  2007   Distal OM  . PROSTATE BIOPSY      Current Outpatient Rx  . Order #: [id number] Class: Historical Med  . Order #: [id number] Class: Print  . Order #: [id number] Class: Normal  . Order #: [id number] Class: Historical Med  . Order #: [id number] Class: Print  . Order #: [id number] Class: Historical Med  . Order #: [id number] Class: Historical Med  . Order #: [id number] Class: Normal  . Order #: [id number] Class: Normal  . Order #: [id number] Class: Print    Allergies Prednisone; Procaine hcl; Levofloxacin; Oxycodone; and Penicillins  Family History  Problem Relation Age of Onset  . Heart disease Mother   . Heart attack  Mother   . Heart disease Father   . Heart attack Father   . Heart attack Brother   . Heart attack Sister   . Stroke Brother     Social History Social History   Tobacco Use  . Smoking status: Never Smoker  . Smokeless tobacco: Never Used  Substance Use Topics  . Alcohol use: No  . Drug use: No    Review of Systems  Constitutional: No fever/chills Eyes: No visual changes. ENT: No sore throat. Cardiovascular: Denies chest pain. Positive heart palpitations.  Respiratory: Positive shortness of breath and  cough.  Gastrointestinal: No abdominal pain.  No nausea, no vomiting.  No diarrhea.  No constipation. Genitourinary: Negative for dysuria. Musculoskeletal: Negative for back pain. Skin: Negative for rash. Neurological: Negative for headaches, focal weakness or numbness.  10-point ROS otherwise negative.  ____________________________________________   PHYSICAL EXAM:  VITAL SIGNS: ED Triage Vitals [07/02/17 1414]  Enc Vitals Group     BP 105/63     Pulse Rate 69     Resp 16     Temp 97.8 F (36.6 C)     Temp Source Oral     SpO2 100 %   Constitutional: Alert and oriented. Well appearing and in no acute distress. Eyes: Conjunctivae are normal.  Head: Atraumatic. Nose: No congestion/rhinnorhea. Mouth/Throat: Mucous membranes are moist.  Oropharynx non-erythematous. Neck: No stridor.  Cardiovascular: Irregularly irregular. Good peripheral circulation. Loud systolic heart murmur.  Respiratory: Normal respiratory effort.  No retractions. Lungs CTAB. Gastrointestinal: Soft and nontender. No distention.  Musculoskeletal: No lower extremity tenderness with 3+ pitting edema. No gross deformities of extremities. Neurologic:  Normal speech and language. No gross focal neurologic deficits are appreciated.  Skin:  Skin is warm, dry and intact. No rash noted.   ____________________________________________   LABS (all labs ordered are listed, but only abnormal results are displayed)  Labs Reviewed  BASIC METABOLIC PANEL - Abnormal; Notable for the following components:      Result Value   Sodium 130 (*)    Chloride 97 (*)    Glucose, Bld 101 (*)    Calcium 8.7 (*)    All other components within normal limits  CBC - Abnormal; Notable for the following components:   RBC 4.18 (*)    Hemoglobin 11.9 (*)    HCT 36.9 (*)    RDW 18.2 (*)    All other components within normal limits  PROTIME-INR - Abnormal; Notable for the following components:   Prothrombin Time 24.5 (*)    All  other components within normal limits  DIGOXIN LEVEL - Abnormal; Notable for the following components:   Digoxin Level 0.5 (*)    All other components within normal limits  BRAIN NATRIURETIC PEPTIDE - Abnormal; Notable for the following components:   B Natriuretic Peptide 442.5 (*)    All other components within normal limits  I-STAT TROPONIN, ED   ____________________________________________  EKG   EKG Interpretation  Date/Time:  Monday July 02 2017 14:11:47 EST Ventricular Rate:  74 PR Interval:    QRS Duration: 100 QT Interval:  402 QTC Calculation: 446 R Axis:   47 Text Interpretation:  Atrial fibrillation Abnormal ECG No STEMI.  Confirmed by Nanda Quinton [phone]) on 07/02/2017 4:00:04 PM       ____________________________________________  RADIOLOGY  Dg Chest 2 View  Result Date: 07/02/2017 CLINICAL DATA:  hx of AFIB and states he goes in and out throughout the day usually. PT states over the last 2  days he hs not converted out of afib and has experienced increased sob and weakness. Denies CP EXAM: CHEST  2 VIEW COMPARISON:  05/20/2017 FINDINGS: Normal cardiac silhouette. Mild airspace disease in lung bases. Small effusions. No focal consolidation. No pneumothorax. No acute osseous abnormality. IMPRESSION: Mild lower lobe pulmonary edema with small effusions. Electronically Signed   By: Suzy Bouchard M.D.   On: 07/02/2017 16:39    ____________________________________________   PROCEDURES  Procedure(s) performed:   Procedures  None ____________________________________________   INITIAL IMPRESSION / ASSESSMENT AND PLAN / ED COURSE  Pertinent labs & imaging results that were available during my care of the patient were reviewed by me and considered in my medical decision making (see chart for details).  Patient presents to the emergency department for evaluation of intermittent palpitations and shortness of breath that are consistent with prior episodes of  A. fib.  Patient is in A. fib on my evaluation but rate is slow when he is asymptomatic.  Lung exam is largely unremarkable.  Patient is on digoxin for rate control and Lasix.  He does have increased edema on exam.  He is followed by Dr. Stanford Breed.  He called that office and was referred to the emergency department for evaluation.  Plan to consult cardiology regarding patient's symptom presentation and possible medication adjustments from the emergency department versus close office follow-up with Dr. Stanford Breed.   05:36 PM Reviewed the case with Dr. Marlou Porch by phone. Plan to increase PM dose of Metoprolol from 12.5 mg to 25 mg. Patient can also take a 12.5 mg dose as needed with Ionna Avis-lasting symptoms. He will call his Cardiology office in the AM to schedule an urgent follow up appointment. Also discussed ED return precautions. Rate well controlled here. No change to Digoxin dose. Defer that to Dr. Stanford Breed.   At this time, I do not feel there is any life-threatening condition present. I have reviewed and discussed all results (EKG, imaging, lab, urine as appropriate), exam findings with patient. I have reviewed nursing notes and appropriate previous records.  I feel the patient is safe to be discharged home without further emergent workup. Discussed usual and customary return precautions. Patient and family (if present) verbalize understanding and are comfortable with this plan.  Patient will follow-up with their primary care provider. If they do not have a primary care provider, information for follow-up has been provided to them. All questions have been answered.  ____________________________________________  FINAL CLINICAL IMPRESSION(S) / ED DIAGNOSES  Final diagnoses:  Shortness of breath  Palpitations     NEW OUTPATIENT MEDICATIONS STARTED DURING THIS VISIT:  Metoprolol 25 mg BID   Note:  This document was prepared using Dragon voice recognition software and may include unintentional dictation  errors.  Nanda Quinton, MD Emergency Medicine    Thomes Burak, [facility], MD 07/02/17 [phone]

## 2017-07-03 ENCOUNTER — Telehealth: Payer: Self-pay | Admitting: Cardiology

## 2017-07-03 NOTE — Telephone Encounter (Signed)
Did not need this encounter °

## 2017-07-04 ENCOUNTER — Telehealth: Payer: Self-pay | Admitting: Family Medicine

## 2017-07-04 ENCOUNTER — Telehealth: Payer: Self-pay | Admitting: Cardiology

## 2017-07-04 ENCOUNTER — Ambulatory Visit: Payer: Medicare HMO | Admitting: Adult Health

## 2017-07-04 NOTE — Telephone Encounter (Signed)
Pt's wife/ Collie Siad called to advise provider of ED visit patient had on 1/14-- Patient advised to contact Cardiologist & PCP regarding visit--Patient's wife states she did call Dr. Jacalyn Lefevre office but has not rcvd a call back from provider-- She thinks ED visit was a panic attack although pt was in (Afib)-- wife request provider review pt's  History and see if she can prescribe an Anti anxiety Rx---pls call her at earliest moment to discuss. --glh

## 2017-07-04 NOTE — Telephone Encounter (Signed)
New Message     Patient wife is calling would like Dr Stanford Breed to call in a low dosage of Xanax the patient is have panic attacks     Patient went to Er Monday for sob and palpitations.

## 2017-07-04 NOTE — Telephone Encounter (Signed)
Spoke with pt wife, post hosp follow up scheduled. She reports the patient is having panic attacks and needs something for his nerves. Wife ask to call medical doctor for any non-cardiac medications.

## 2017-07-04 NOTE — Telephone Encounter (Signed)
Wife called on behalf of patient and left VM on office phone. She is needing a prescription for an unspecified med that she thought would come from cardiology, she called the [facility] cardiologist but was told they do not prescribe med and that it would have to come from PCP office. She would like to speak with someone about this. She can be reached at home at [phone]

## 2017-07-05 NOTE — Telephone Encounter (Signed)
Spoke to the patient's wife and notified that patient will need to be seen by Dr. Raliegh Scarlet in the office for any new medications.  Patient's wife also advise that patient needs a follow up from the ER visit.  Patient's wife is requesting Xanax.  Patient has never been on this and per ER notes patient has a-fib.  Per office policy appointment is needed for adding medication.  Wife was transferred back to the front desk to make the appointment. MPulliam, CMA/RT(R)

## 2017-07-10 ENCOUNTER — Ambulatory Visit (INDEPENDENT_AMBULATORY_CARE_PROVIDER_SITE_OTHER): Payer: Medicare HMO | Admitting: Family Medicine

## 2017-07-10 ENCOUNTER — Ambulatory Visit: Payer: Medicare HMO | Admitting: Cardiology

## 2017-07-10 ENCOUNTER — Encounter: Payer: Self-pay | Admitting: Cardiology

## 2017-07-10 ENCOUNTER — Encounter: Payer: Self-pay | Admitting: Family Medicine

## 2017-07-10 ENCOUNTER — Telehealth: Payer: Self-pay | Admitting: Cardiology

## 2017-07-10 VITALS — BP 122/82 | HR 78 | Ht 70.5 in | Wt 177.0 lb

## 2017-07-10 VITALS — BP 110/67 | HR 76 | Ht 71.0 in | Wt 178.0 lb

## 2017-07-10 DIAGNOSIS — I2510 Atherosclerotic heart disease of native coronary artery without angina pectoris: Secondary | ICD-10-CM | POA: Diagnosis not present

## 2017-07-10 DIAGNOSIS — I482 Chronic atrial fibrillation, unspecified: Secondary | ICD-10-CM

## 2017-07-10 DIAGNOSIS — I4821 Permanent atrial fibrillation: Secondary | ICD-10-CM

## 2017-07-10 DIAGNOSIS — Z79899 Other long term (current) drug therapy: Secondary | ICD-10-CM

## 2017-07-10 DIAGNOSIS — Z7901 Long term (current) use of anticoagulants: Secondary | ICD-10-CM

## 2017-07-10 DIAGNOSIS — R339 Retention of urine, unspecified: Secondary | ICD-10-CM | POA: Diagnosis not present

## 2017-07-10 DIAGNOSIS — I350 Nonrheumatic aortic (valve) stenosis: Secondary | ICD-10-CM

## 2017-07-10 DIAGNOSIS — G4700 Insomnia, unspecified: Secondary | ICD-10-CM | POA: Diagnosis not present

## 2017-07-10 MED ORDER — FUROSEMIDE 20 MG PO TABS: 40.0000 mg | ORAL_TABLET | Freq: Every day | ORAL | 3 refills | Status: DC

## 2017-07-10 NOTE — Telephone Encounter (Signed)
Patient wife calling, would like some instructions in regards to medication. Patient will leave home in 10 mins if call is after the timeframe, please leave message

## 2017-07-10 NOTE — Progress Notes (Signed)
Impression and Recommendations:    1. Insomnia, unspecified type   2. Urinary retention   3. High risk medication use   4. Long term current use of anticoagulant   5. Chronic atrial fibrillation ([place])    1. Sleep insomnia - Discussed with pt alternative practices to sleep disturbances such as drinking chamomile tea before bed, listening to sleep music, meditating in a dark room, as well as the importance of having good sleep hygiene. Recommended pt to continue using a warm blanket or receiving a warm back rub to improve his sleep disturbances. In addition to the other alternative practices, recommended pt to take melatonin as-needed for sleep. Discussed with pt sleeping in a recliner. Pt instructed not to take naps throughout the day, as well as sleeping at regular times every night. Education and handouts provided.  2. Urinary retention-  Discussed with pt the potential side effects and risks of taking tylenol pm, benadryl and the like, due to his history of urinary retention which he easily got from taking this medicine one time in the past  3. High risk medication use-  Pt was requesting xanax, which we declined due to pt's age and poor state of health, as well as the potential drowsy side effects of xanax. As he is on coumadin, he is at high risk of fall with life-threatening consequences.    4. Long term current use of anticoagulant-patient understands very important does not take anything that alters his level of consciousness too much to make him tipsy or fall over etc.   5. Chronic atrial fibrillation-  Pt has a cardiologist, Dr. Stanford Breed, who is following him closely for this.  -Follow up in the near future if these symptoms fail to improve or worsen.    Education and routine counseling performed. Handouts provided.  Gross side effects, risk and benefits, and alternatives of medications and treatment plan in general discussed with patient.  Patient is aware that all  medications have potential side effects and we are unable to predict every side effect or drug-drug interaction that may occur.   Patient will call with any questions prior to using medication if they have concerns.  Expresses verbal understanding and consents to current therapy and treatment regimen.  No barriers to understanding were identified.  Red flag symptoms and signs discussed in detail.  Patient expressed understanding regarding what to do in case of emergency\urgent symptoms  Please see AVS handed out to patient at the end of our visit for further patient instructions/ counseling done pertaining to today's office visit.   Return if symptoms worsen or fail to improve.     Note: This document was prepared using Dragon voice recognition software and may include unintentional dictation errors.  This document serves as a record of services personally performed by [name], DO. It was created on her behalf by Mayer Masker, a trained medical scribe. The creation of this record is based on the scribe's personal observations and the provider's statements to them.   I have reviewed the above medical documentation for accuracy and completeness and I concur.  [name] 07/10/17 5:15 PM  --------------------------------------------------------------------------------------------------------------------------------------------------------------------------------------------------------------------------------------------    Subjective:    CC:  Chief Complaint  Patient presents with  . Anxiety    HPI: Wayne [name] is a 84 y.o. male who presents to [place] at [facility] today for follow-up of mood. Pt has an extensive PMHx of CHF, aortic and mitral stenosis, and afib  on coumadin. He also has a h/o prostate problem.  Mood: Pt states a few weeks ago, he denied all anxiety symptoms and has never been diagnosed with anxiety. However, last week, he woke  up at 4 AM and was anxious and aggravated with some mild difficulty breathing. That, then, progressed to heart palpitations and mild SOB. He later went to the ED and was diagnosed with Afib. He then went to his cardiologist 07-10-17 for further evaluation. His cardiologist increased his lasix dose to 40 mg BID for 3 days, then 40 mg daily, and he will then follow up with his cardiologist in 2 weeks. Pt states he is aware of his cardiovascular issues, but is confident his symptoms were as a result of anxiety. He has not had any anxiety attacks since then. He is requesting xanax for his anxiety.    Depression screen [facility] 2/9 07/10/2017 06/14/2017  Decreased Interest 0 1  Down, Depressed, Hopeless 0 0  PHQ - 2 Score 0 1  Altered sleeping 0 1  Tired, decreased energy 0 3  Change in appetite 0 0  Feeling bad or failure about yourself  0 -  Trouble concentrating 0 1  Moving slowly or fidgety/restless 0 3  Suicidal thoughts 0 0  PHQ-9 Score 0 9  Difficult doing work/chores Not difficult at all Somewhat difficult     Wt Readings from Last 3 Encounters:  07/10/17 178 lb (80.7 kg)  07/10/17 177 lb (80.3 kg)  06/14/17 176 lb 14.4 oz (80.2 kg)   BP Readings from Last 3 Encounters:  07/10/17 110/67  07/10/17 122/82  07/02/17 123/78   Pulse Readings from Last 3 Encounters:  07/10/17 76  07/10/17 78  07/02/17 68   BMI Readings from Last 3 Encounters:  07/10/17 24.83 kg/m  07/10/17 25.04 kg/m  06/14/17 24.67 kg/m    Patient Care Team    Relationship Specialty Notifications Start End  [name], DO PCP - General Family Medicine  10/23/16   Lelon Perla, MD PCP - Cardiology Cardiology Admissions 07/06/17   Jarome Matin, MD Consulting Physician Dermatology  10/23/16   Kathie Rhodes, MD Consulting Physician Urology  10/23/16   Latanya Maudlin, MD Consulting Physician Orthopedic Surgery  11/16/16    Comment: back pain     Patient Active Problem List   Diagnosis Date Noted  .  Vitamin D deficiency 11/16/2016    Priority: High  . H/O non anemic vitamin B12 deficiency 11/16/2016    Priority: High  . Mild Colonic dysmotility 11/16/2016    Priority: High  . Chronic atrial fibrillation ([place]) 09/13/2010    Priority: High  . Chronic anticoagulation     Priority: Medium  . HTN (hypertension)     Priority: Medium  . CAD (coronary artery disease)     Priority: Medium  . h/o CVA (cerebrovascular accident due to intracerebral hemorrhage) 09/13/2010    Priority: Medium  . BPH with obstruction/lower urinary tract symptoms 09/13/2016    Priority: Low  . Acute on chronic diastolic CHF (congestive heart failure) ([place]) 05/20/2017  . Hyponatremia 05/20/2017  . Aortic stenosis 05/20/2017  . Urinary retention   . Arthritis of facet joints at multiple vertebral levels ([place]) 04/26/2017  . Spinal stenosis at L4-L5 level 04/26/2017  . Lumbar radiculopathy, chronic 04/26/2017  . Benign colonic polyp- 25 yrs ago.  11/16/2016  . Hypokalemia 09/13/2016  . Urinary frequency 07/10/2016  . Left bundle branch block (LBBB) on electrocardiogram 06/30/2016  . History of stroke 06/30/2016  .  Generalized weakness 06/30/2016  . Osteoarthritis 04/04/2016  . Encounter for therapeutic drug monitoring 08/06/2013    Past Medical history, Surgical history, Family history, Social history, Allergies and Medications have been entered into the medical record, reviewed and changed as needed.    Current Meds  Medication Sig  . alfuzosin (UROXATRAL) 10 MG 24 hr tablet Take 10 mg by mouth daily with breakfast.  . cholecalciferol 2000 units TABS Take 1 tablet (2,000 Units total) by mouth daily.  . digoxin (LANOXIN) 0.125 MG tablet TAKE ONE TABLET BY MOUTH ONCE DAILY (Patient taking differently: TAKE ONE TABLET (170mcg) BY MOUTH ONCE DAILY)  . finasteride (PROSCAR) 5 MG tablet Take 1 tablet (5 mg total) by mouth daily.  . furosemide (LASIX) 20 MG tablet Take 2 tablets (40 mg total) by mouth daily.    . methocarbamol (ROBAXIN) 500 MG tablet Take 500 mg by mouth every 6 (six) hours as needed (cramps).   . metoprolol tartrate (LOPRESSOR) 25 MG tablet TAKE ONE TABLET (25mg ) BY MOUTH TWICE DAILY.  Marland Kitchen polyethylene glycol (MIRALAX / GLYCOLAX) packet Take 17 g by mouth daily as needed.   . potassium chloride (K-DUR) 10 MEQ tablet Take 1 tablet (10 mEq total) by mouth daily as needed. Take potasium if you take lasix. (Patient taking differently: Take 10 mEq by mouth daily. Take potasium if you take lasix.)  . warfarin (COUMADIN) 2.5 MG tablet TAKE 1 TO 2 TABLETS BY MOUTH ONCE DAILY AS DIRECTED BY  COUMADIN  CLINIC (Patient taking differently: Take 2.5-5 mg by mouth See admin instructions. 5mg  on S M W TH F and 2.5mg  on T SAT)    Allergies:  Allergies  Allergen Reactions  . Prednisone Other (See Comments)    Wheezing  . Procaine Hcl Other (See Comments)    Passed out after being given this at dental appointment  . Levofloxacin Rash  . Oxycodone Other (See Comments)    constipation  . Penicillins Rash    Has patient had a PCN reaction causing immediate rash, facial/tongue/throat swelling, SOB or lightheadedness with hypotension: Yes Has patient had a PCN reaction causing severe rash involving mucus membranes or skin necrosis: No Has patient had a PCN reaction that required hospitalization No Has patient had a PCN reaction occurring within the last 10 years: No If all of the above answers are "NO", then may proceed with Cephalosporin use.     Review of Systems: Review of Systems: General:   No F/C, wt loss Pulm:   No DIB, SOB, pleuritic chest pain Card:  No CP, palpitations Abd:  No n/v/d or pain Ext:  No inc edema from baseline Psych: no SI/ HI    Objective:   Blood pressure 110/67, pulse 76, height 5\' 11"  (1.803 m), weight 178 lb (80.7 kg), SpO2 97 %. Body mass index is 24.83 kg/m. General:  Well Developed, well nourished, appropriate for stated age.  Neuro:  Alert and oriented,   extra-ocular muscles intact  HEENT:  Normocephalic, atraumatic, neck supple, no carotid bruits appreciated  Skin:  no gross rash, warm, pink. Cardiac:  RRR, S1 S2 Respiratory:  ECTA B/L and A/P, Not using accessory muscles, speaking in full sentences- unlabored. Vascular:  Ext warm, no cyanosis apprec.; cap RF less 2 sec. Psych:  No HI/SI, judgement and insight good, Euthymic mood. Full Affect.

## 2017-07-10 NOTE — Patient Instructions (Signed)
Medication Instructions:   INCREASE FUROSEMIDE TO 40 MG TWICE DAILY FOR THE NEXT 3 DAYS THEN DECREASE BACK TO 40 MG ONCE DAILY= 2 OF THE 20 MG TABLETS TWICE DAILY FOR THE NEXT 3 DAYS THEN DECREASE TO 2 TABLETS ONCE DAILY  Labwork:  Your physician recommends that you return for lab work in:ONE WEEK  Follow-Up:  Your physician recommends that you schedule a follow-up appointment in: [place]

## 2017-07-10 NOTE — Telephone Encounter (Signed)
Returned call to wife (ok per DPR) who states the furosemide dose was increased at OV today and was wondering if potassium needed to be increased as well.   Spoke to primary, advised to increase potassium to 10 meq BID for 3 days with the increased dose of furosemide.  Lab recheck in 1 week.  Wife made aware and verbalized understanding.

## 2017-07-10 NOTE — Progress Notes (Addendum)
HPI: FU AS/MR, atrial fibrillation and CAD.Cardiac catheterization 2007 showed ejection fraction 50-55%, 100% mid to distal obtuse marginal. Patient had PCI of the OM at that time. 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 Admitted 12/18 with urinary retention which improved with foley cath. Also with hyponatremia. Then seen 05/24/17 with atrial fibrillation (HR felt to be elevated by urology but 100 upon eval in ER). Last echo 12/18 showed normal LV function, severe AS with mean gradient 44 mmHg, severe LAE, flail MV with severe MS (mean gradient 11 mmHg) and severe MR, mild to moderate TR. Patient seen in the emergency room January 14 with complaints of dyspnea.  Laboratories showed sodium 130, BUN 13 and creatinine 0.82, hemoglobin 11.9, BNP 442, and chest x-ray showed mild edema with small effusions.  Patient also complained of elevated heart rate.  Metoprolol was increased.  Since last seen, patient has noticed increased dyspnea on exertion and what sounds to be orthopnea.  He has noticed increasing pedal edema.  He denies chest pain or syncope.  He states he is passing his urine.  Current Outpatient Medications  Medication Sig Dispense Refill  . alfuzosin (UROXATRAL) 10 MG 24 hr tablet Take 10 mg by mouth daily with breakfast.    . cholecalciferol 2000 units TABS Take 1 tablet (2,000 Units total) by mouth daily. 30 tablet 0  . digoxin (LANOXIN) 0.125 MG tablet TAKE ONE TABLET BY MOUTH ONCE DAILY (Patient taking differently: TAKE ONE TABLET (181mcg) BY MOUTH ONCE DAILY) 90 tablet 3  . finasteride (PROSCAR) 5 MG tablet Take 1 tablet (5 mg total) by mouth daily.    . furosemide (LASIX) 20 MG tablet Take 1 tablet (20 mg total) by mouth as needed for fluid or edema (take one tablet a day if your weight increases more than 2 pounds in 24 hours.). (Patient taking differently: Take 20 mg by mouth daily. ) 30 tablet 6  . methocarbamol (ROBAXIN) 500 MG tablet Take  500 mg by mouth every 6 (six) hours as needed (cramps).     . metoprolol tartrate (LOPRESSOR) 25 MG tablet TAKE ONE TABLET (25mg ) BY MOUTH TWICE DAILY. 135 tablet 3  . polyethylene glycol (MIRALAX / GLYCOLAX) packet Take 17 g by mouth daily as needed.     . potassium chloride (K-DUR) 10 MEQ tablet Take 1 tablet (10 mEq total) by mouth daily as needed. Take potasium if you take lasix. (Patient taking differently: Take 10 mEq by mouth daily. Take potasium if you take lasix.) 30 tablet 2  . warfarin (COUMADIN) 2.5 MG tablet TAKE 1 TO 2 TABLETS BY MOUTH ONCE DAILY AS DIRECTED BY  COUMADIN  CLINIC (Patient taking differently: Take 2.5-5 mg by mouth See admin instructions. 5mg  on [facility] and 2.5mg  on T SAT) 135 tablet 0   No current facility-administered medications for this visit.      Past Medical History:  Diagnosis Date  . Aortic stenosis   . Arthritis   . Atrial fibrillation (HCC)    Chronic  . BPH (benign prostatic hyperplasia)   . CAD (coronary artery disease)   . Chronic anticoagulation   . History of chicken pox   . HTN (hypertension)   . Mitral stenosis   . Old MI (myocardial infarction) 2007   s/p PCI to distal OM (no stent)  . Stroke, embolic [facility])     Past Surgical History:  Procedure Laterality Date  . CORONARY  ANGIOPLASTY  2007   Distal OM  . PROSTATE BIOPSY      Social History   Socioeconomic History  . Marital status: Married    Spouse name: Not on file  . Number of children: 3  . Years of education: 16  . Highest education level: Not on file  Social Needs  . Financial resource strain: Not on file  . Food insecurity - worry: Not on file  . Food insecurity - inability: Not on file  . Transportation needs - medical: Not on file  . Transportation needs - non-medical: Not on file  Occupational History  . Not on file  Tobacco Use  . Smoking status: Never Smoker  . Smokeless tobacco: Never Used  Substance and Sexual Activity  . Alcohol use: No  . Drug  use: No  . Sexual activity: Not on file  Other Topics Concern  . Not on file  Social History Narrative   Fun: Garden, work in the yard, anything physical and walk daily    Family History  Problem Relation Age of Onset  . Heart disease Mother   . Heart attack Mother   . Heart disease Father   . Heart attack Father   . Heart attack Brother   . Heart attack Sister   . Stroke Brother     ROS: no fevers or chills, productive cough, hemoptysis, dysphasia, odynophagia, melena, hematochezia, dysuria, hematuria, rash, seizure activity, orthopnea, PND, claudication. Remaining systems are negative.  Physical Exam: Well-developed well-nourished in no acute distress.  Skin is warm and dry.  HEENT is normal.  Neck is supple.  Chest is clear to auscultation with normal expansion.  Cardiovascular exam is irregular, 3/6 systolic murmur Abdominal exam nontender or distended. No masses palpated. Extremities show 2+ edema. neuro grossly intact  ECG-atrial fibrillation at a rate of 78.  No ST changes.  Personally reviewed  A/P  1 permanent atrial fibrillation-patient's heart rate appears to be controlled.  Continue digoxin and metoprolol.  Continue Coumadin.    2 severe aortic stenosis-patient has severe aortic stenosis and severe mitral regurgitation with some degree of mitral stenosis.  Long discussion in the office today concerning options.  He is nearing 84 years old.  He is clear that he would not consider conventional aortic valve/mitral valve replacement.  He would potentially consider TAVR if he were felt to be a candidate.  AVR would not address his mitral valve although hopefully mitral regurgitation would improve if aortic stenosis is corrected.  I will ask Dr. Burt Knack to review his previous transthoracic.  If he is felt potentially to be a candidate we could perform a TEE to more fully assess and he would also require cardiac catheterization prior to the procedure.  He is willing to  consider this.  In the meantime I will increase his Lasix to 40 mg twice daily for 3 days and then 40 mg daily thereafter.  Check potassium and renal function in 2 weeks.  Follow-up with me in 2 weeks.  3 coronary artery disease-patient has declined statins previously.  Not on aspirin given need for Coumadin.  4 hypertension-blood pressure is controlled.  Continue present medications.  5 edema-edema is worse today.  There may be a contribution from urinary retention but I am now concerned his valvular heart disease is causing this recent exacerbation.  Plan as outlined under aortic stenosis.  I spent 30 minutes discussing options with patient and his wife in the office today. Kirk Ruths, MD

## 2017-07-10 NOTE — Patient Instructions (Addendum)
If you have insomnia or difficulty sleeping, this information is for you:  I do not recommend Tylenol PM or anything with Benadryl or anything thing that can be sedating and make him unsteady on his feet such as Xanax or any other sleep medications.  Try not to sleep during the day or take any naps.  Also, you can take melatonin 1-3 mg nightly before bedtime  - Avoid caffeinated beverages after lunch,  no alcoholic beverages,  no eating within 2-3 hours of lying down,  avoid exposure to blue light before bed,  avoid daytime naps, and  needs to maintain a regular sleep schedule- go to sleep and wake up around the same time every night.   - Resolve concerns or worries before entering bedroom:  Discussed relaxation techniques with patient and to keep a journal to write down fears\ worries.   - Recommend patient meditate or do deep breathing exercises to help relax.   Incorporate the use of white noise machines or listen to "sleep meditation music", or recordings of guided meditations for sleep from YouTube which are free, such as  "guided meditation for detachment from over thinking"  by Mayford Knife.   Sleep music with biurinal beats

## 2017-07-13 ENCOUNTER — Telehealth: Payer: Self-pay | Admitting: *Deleted

## 2017-07-13 ENCOUNTER — Other Ambulatory Visit: Payer: Self-pay | Admitting: *Deleted

## 2017-07-13 DIAGNOSIS — I350 Nonrheumatic aortic (valve) stenosis: Secondary | ICD-10-CM

## 2017-07-13 NOTE — Telephone Encounter (Addendum)
Per dr Stanford Breed, patient called and scheduled for TEE 07/20/17 @ 2 pm pm for AS. Instructions discussed in detail with the patients wife.

## 2017-07-16 NOTE — Progress Notes (Signed)
HPI: FUAS/MR,atrial fibrillation and CAD.Cardiac catheterization 2007 showed ejection fraction 50-55%, 100% mid to distal obtuse marginal. Patient had PCI of the OM at that time. 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 Also with hyponatremia.Then seen 05/24/17 with atrial fibrillation (HR felt to be elevated by urology but 100 upon eval in ER). Last echo 12/18 showed normal LV function, severe AS with mean gradient 44 mmHg, severe LAE, flail MV with severe MS (mean gradient 11 mmHg) and severe MR, mild to moderate TR. Patient seen in the emergency room January 14 with complaints of dyspnea.  Laboratories showed sodium 130, BUN 13 and creatinine 0.82, hemoglobin 11.9, BNP 442, and chest x-ray showed mild edema with small effusions.  Patient also complained of elevated heart rate.  Metoprolol was increased.  At last office visit, pt stated he would consider TVAR but not SAVR. 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 Since last seen,  he notes some dyspnea on exertion.  There is mild orthopnea and question PND.  He does have pedal edema.  He is not taking his Lasix as prescribed.  He takes 40 mg 1 day and 20 mg the next.  He denies chest pain or syncope.  Current Outpatient Medications  Medication Sig Dispense Refill  . acetaminophen (TYLENOL) 500 MG tablet Take 1,000 mg by mouth every 6 (six) hours as needed for moderate pain or headache.    . alfuzosin (UROXATRAL) 10 MG 24 hr tablet Take 10 mg by mouth daily with breakfast.    . ARTIFICIAL TEAR OP Place 1 drop into both eyes daily as needed (dry eyes).    .  calcium carbonate (TUMS - DOSED IN MG ELEMENTAL CALCIUM) 500 MG chewable tablet Chew 1-2 tablets by mouth daily as needed for indigestion or heartburn.    . cholecalciferol 2000 units TABS Take 1 tablet (2,000 Units total) by mouth daily. 30 tablet 0  . digoxin (LANOXIN) 0.125 MG tablet TAKE ONE TABLET BY MOUTH ONCE DAILY (Patient taking differently: TAKE ONE TABLET (129mcg) BY MOUTH ONCE DAILY) 90 tablet 3  . finasteride (PROSCAR) 5 MG tablet Take 1 tablet (5 mg total) by mouth daily.    . furosemide (LASIX) 20 MG tablet Take 2 tablets (40 mg total) by mouth daily. 90 tablet 3  . Melatonin 1 MG TABS Take 1 mg by mouth at bedtime as needed (sleep).    . Menthol, Topical Analgesic, (BIOFREEZE EX) Apply 1 application topically daily as needed (muscle pain).    . Menthol, Topical Analgesic, (BLUE-EMU MAXIMUM STRENGTH EX) Apply 1 application topically daily as needed (muscle pain).    . methocarbamol (ROBAXIN) 500 MG tablet Take 500 mg by mouth daily as needed (cramps).     . metoprolol tartrate (LOPRESSOR) 25 MG tablet TAKE ONE TABLET (25mg ) BY MOUTH TWICE DAILY. 135 tablet 3  . polyethylene glycol (MIRALAX / GLYCOLAX) packet Take 17 g by mouth daily as needed for mild constipation.     . potassium chloride (K-DUR) 10 MEQ tablet Take 1 tablet (10 mEq total) by mouth daily as needed. Take potasium if you take lasix. (Patient taking differently: Take 10 mEq by mouth daily. Take potasium if  you take lasix.) 30 tablet 2  . sodium chloride (OCEAN) 0.65 % SOLN nasal spray Place 1 spray into both nostrils daily as needed for congestion.    . triamcinolone cream (KENALOG) 0.1 % Apply 1 application topically daily as needed (dry skin).    Marland Kitchen warfarin (COUMADIN) 2.5 MG tablet TAKE 1 TO 2 TABLETS BY MOUTH ONCE DAILY AS DIRECTED BY  COUMADIN  CLINIC (Patient taking differently: Take 2.5-5 mg by mouth See admin instructions. Take 2.5 mg once daily at night on Tue and Sat.  Take 5 mg once daily at night on Mon, Wed,  Thurs, Fri, and Sun) 135 tablet 0   No current facility-administered medications for this visit.      Past Medical History:  Diagnosis Date  . Aortic stenosis   . Arthritis   . Atrial fibrillation (HCC)    Chronic  . BPH (benign prostatic hyperplasia)   . CAD (coronary artery disease)   . Chronic anticoagulation   . History of chicken pox   . HTN (hypertension)   . Mitral stenosis   . Old MI (myocardial infarction) 2007   s/p PCI to distal OM (no stent)  . Stroke, embolic [facility])     Past Surgical History:  Procedure Laterality Date  . CORONARY ANGIOPLASTY  2007   Distal OM  . PROSTATE BIOPSY      Social History   Socioeconomic History  . Marital status: Married    Spouse name: Not on file  . Number of children: 3  . Years of education: 41  . Highest education level: Not on file  Social Needs  . Financial resource strain: Not on file  . Food insecurity - worry: Not on file  . Food insecurity - inability: Not on file  . Transportation needs - medical: Not on file  . Transportation needs - non-medical: Not on file  Occupational History  . Not on file  Tobacco Use  . Smoking status: Never Smoker  . Smokeless tobacco: Never Used  Substance and Sexual Activity  . Alcohol use: No  . Drug use: No  . Sexual activity: Not on file  Other Topics Concern  . Not on file  Social History Narrative   Fun: Garden, work in the yard, anything physical and walk daily    Family History  Problem Relation Age of Onset  . Heart disease Mother   . Heart attack Mother   . Heart disease Father   . Heart attack Father   . Heart attack Brother   . Heart attack Sister   . Stroke Brother     ROS: no fevers or chills, productive cough, hemoptysis, dysphasia, odynophagia, melena, hematochezia, dysuria, hematuria, rash, seizure activity, orthopnea, PND, claudication. Remaining systems are negative.  Physical Exam: Well-developed well-nourished in no acute distress.  Skin is warm  and dry.  HEENT is normal.  Neck is supple.  Chest is clear to auscultation with normal expansion.  Cardiovascular exam is irregular, 3/6 sm Abdominal exam nontender or distended. No masses palpated. Extremities show 1+ edema. neuro grossly intact    A/P  1 severe aortic stenosis-patient's transesophageal echocardiogram shows severe aortic stenosis and severe mitral regurgitation.  Patient has made it clear he would never consider surgical replacement of aortic and mitral valves and it is not clear he would be a candidate given his age and limited mobility.  He will consider TAVR (though would not address MV).  Hopefully replacing his aortic valve with decreased left ventricular pressures  and also improve his mitral regurgitation.  I will arrange for an evaluation with Dr. Burt Knack.  2 mitral regurgitation-plan as outlined under #1.  3 permanent atrial fibrillation-patient's heart rate is controlled.  We will continue with digoxin and metoprolol.  Continue Coumadin.  4 chronic diastolic congestive heart failure-this is likely related to his valvular heart disease.  He is volume overloaded today but not taking his Lasix as prescribed.  I will increase Lasix to 80 mg daily for 3 days and then resume 40 mg daily.  Note he was taking 40 mg 1 day and 20 mg the next.  Check potassium and renal function in 1 week.  5 coronary artery disease-patient is not on aspirin given need for Coumadin.  He has declined statins in the past.  6 hypertension-blood pressure is controlled.  Continue present medications.  Kirk Ruths, MD

## 2017-07-17 DIAGNOSIS — I2510 Atherosclerotic heart disease of native coronary artery without angina pectoris: Secondary | ICD-10-CM | POA: Diagnosis not present

## 2017-07-17 DIAGNOSIS — R69 Illness, unspecified: Secondary | ICD-10-CM | POA: Diagnosis not present

## 2017-07-18 LAB — BASIC METABOLIC PANEL
BUN/Creatinine Ratio: 17 (ref 10–24)
BUN: 16 mg/dL (ref 8–27)
CHLORIDE: 96 mmol/L (ref 96–106)
CO2: 26 mmol/L (ref 20–29)
Calcium: 9.3 mg/dL (ref 8.6–10.2)
Creatinine, Ser: 0.94 mg/dL (ref 0.76–1.27)
GFR calc Af Amer: 85 mL/min/{1.73_m2} (ref >59)
GFR calc non Af Amer: 74 mL/min/{1.73_m2} (ref >59)
GLUCOSE: 116 mg/dL — AB (ref 65–99)
POTASSIUM: 4.4 mmol/L (ref 3.5–5.2)
SODIUM: 138 mmol/L (ref 134–144)

## 2017-07-20 ENCOUNTER — Encounter (HOSPITAL_COMMUNITY): Payer: Self-pay | Admitting: *Deleted

## 2017-07-20 ENCOUNTER — Ambulatory Visit (HOSPITAL_BASED_OUTPATIENT_CLINIC_OR_DEPARTMENT_OTHER)
Admission: RE | Admit: 2017-07-20 | Discharge: 2017-07-20 | Disposition: A | Discharge status: Home / Self Care | Payer: Medicare HMO | Source: Ambulatory Visit | Admitting: Cardiology

## 2017-07-20 ENCOUNTER — Encounter (HOSPITAL_COMMUNITY)
Admission: RE | Disposition: A | Discharge status: Home / Self Care | Payer: Self-pay | Source: Ambulatory Visit | Attending: Cardiology

## 2017-07-20 ENCOUNTER — Telehealth: Payer: Self-pay | Admitting: Cardiology

## 2017-07-20 ENCOUNTER — Ambulatory Visit (HOSPITAL_COMMUNITY)
Admission: RE | Admit: 2017-07-20 | Discharge: 2017-07-20 | Disposition: A | Discharge status: Home / Self Care | Payer: Medicare HMO | Source: Ambulatory Visit | Attending: Cardiology | Admitting: Cardiology

## 2017-07-20 DIAGNOSIS — I10 Essential (primary) hypertension: Secondary | ICD-10-CM | POA: Diagnosis not present

## 2017-07-20 DIAGNOSIS — Z8249 Family history of ischemic heart disease and other diseases of the circulatory system: Secondary | ICD-10-CM | POA: Insufficient documentation

## 2017-07-20 DIAGNOSIS — I350 Nonrheumatic aortic (valve) stenosis: Secondary | ICD-10-CM

## 2017-07-20 DIAGNOSIS — N401 Enlarged prostate with lower urinary tract symptoms: Secondary | ICD-10-CM | POA: Diagnosis not present

## 2017-07-20 DIAGNOSIS — I252 Old myocardial infarction: Secondary | ICD-10-CM | POA: Diagnosis not present

## 2017-07-20 DIAGNOSIS — Z8673 Personal history of transient ischemic attack (TIA), and cerebral infarction without residual deficits: Secondary | ICD-10-CM | POA: Insufficient documentation

## 2017-07-20 DIAGNOSIS — R600 Localized edema: Secondary | ICD-10-CM | POA: Diagnosis not present

## 2017-07-20 DIAGNOSIS — R338 Other retention of urine: Secondary | ICD-10-CM | POA: Insufficient documentation

## 2017-07-20 DIAGNOSIS — I2510 Atherosclerotic heart disease of native coronary artery without angina pectoris: Secondary | ICD-10-CM | POA: Insufficient documentation

## 2017-07-20 DIAGNOSIS — I482 Chronic atrial fibrillation: Secondary | ICD-10-CM | POA: Diagnosis not present

## 2017-07-20 DIAGNOSIS — Z7901 Long term (current) use of anticoagulants: Secondary | ICD-10-CM | POA: Diagnosis not present

## 2017-07-20 DIAGNOSIS — I080 Rheumatic disorders of both mitral and aortic valves: Secondary | ICD-10-CM | POA: Insufficient documentation

## 2017-07-20 DIAGNOSIS — Z9861 Coronary angioplasty status: Secondary | ICD-10-CM | POA: Diagnosis not present

## 2017-07-20 DIAGNOSIS — M1990 Unspecified osteoarthritis, unspecified site: Secondary | ICD-10-CM | POA: Insufficient documentation

## 2017-07-20 HISTORY — PX: TEE WITHOUT CARDIOVERSION: SHX5443

## 2017-07-20 LAB — ECHO TEE
AOASC: 34 cm
AV Mean grad: 55 mmHg
AV Peak grad: 93 mmHg
AV pk vel: 483 cm/s
DOP CAL AO MEAN VELOCITY: 342 cm/s
LVOT area: 3.14 cm2
LVOTD: 20 mm
VTI: 117 cm

## 2017-07-20 SURGERY — ECHOCARDIOGRAM, TRANSESOPHAGEAL
Anesthesia: Moderate Sedation

## 2017-07-20 MED ORDER — FENTANYL CITRATE (PF) 100 MCG/2ML IJ SOLN
INTRAMUSCULAR | Status: DC | PRN
  Administered 2017-07-20: 25 ug via INTRAVENOUS

## 2017-07-20 MED ORDER — MIDAZOLAM HCL 10 MG/2ML IJ SOLN
INTRAMUSCULAR | Status: DC | PRN
  Administered 2017-07-20 (×3): 1 mg via INTRAVENOUS

## 2017-07-20 MED ORDER — FENTANYL CITRATE (PF) 100 MCG/2ML IJ SOLN
INTRAMUSCULAR | Status: AC
  Filled 2017-07-20: qty 2

## 2017-07-20 MED ORDER — MIDAZOLAM HCL 5 MG/ML IJ SOLN
INTRAMUSCULAR | Status: AC
  Filled 2017-07-20: qty 2

## 2017-07-20 MED ORDER — SODIUM CHLORIDE 0.9 % IV SOLN: INTRAVENOUS | Status: DC

## 2017-07-20 MED ORDER — LIDOCAINE VISCOUS 2 % MT SOLN
OROMUCOSAL | Status: DC | PRN
  Administered 2017-07-20: 10 mL via OROMUCOSAL

## 2017-07-20 MED ORDER — LIDOCAINE VISCOUS 2 % MT SOLN
OROMUCOSAL | Status: AC
  Filled 2017-07-20: qty 15

## 2017-07-20 NOTE — CV Procedure (Signed)
    Transesophageal Echocardiogram Note  Wayne [name] [id number] 1930-05-03  Procedure: Transesophageal Echocardiogram Indications: AS/MR  Procedure Details Consent: Obtained Time Out: Verified patient identification, verified procedure, site/side was marked, verified correct patient position, special equipment/implants available, Radiology Safety Procedures followed,  medications/allergies/relevent history reviewed, required imaging and test results available.  Performed  Medications:  During this procedure the patient is administered a total of Versed 3 mg and Fentanyl 25 mcg  to achieve and maintain moderate conscious sedation.  The patient's heart rate, blood pressure, and oxygen saturation are monitored continuously during the procedure. The period of conscious sedation is 30 minutes, of which I was present face-to-face 100% of this time.  Normal LV function; calcified aortic valve with severe AS (mean gradient 56 mmHg); severe MR; moderate RAE; severe LVE with no LAA thrombus.   Complications: No apparent complications Patient did tolerate procedure well.  Kirk Ruths, MD

## 2017-07-20 NOTE — Discharge Instructions (Signed)

## 2017-07-20 NOTE — Telephone Encounter (Signed)
New message    Patient spouse calling for instructions regarding TEE today. Please call

## 2017-07-20 NOTE — H&P (Signed)
Office Visit   07/10/2017 [facility] Wendy Poet, MD  Cardiology   Nonrheumatic aortic valve stenosis +2 more  Dx   Follow-up , Atrial Fibrillation ; Referred by [name], DO  Reason for Visit   Additional Documentation   Vitals:   BP 122/82   Pulse 78   Ht 5' 10.5" (1.791 m)   Wt 177 lb (80.3 kg)   BMI 25.04 kg/m   BSA 2 m      More Vitals   Flowsheets:   Anthropometrics,   MEWS Score     Encounter Info:   Billing Info,   History,   Allergies,   Detailed Report     All Notes   Procedures by Lelon Perla, MD at 07/10/2017 12:20 PM   Author: Lelon Perla, MD Author Type: Physician Filed: 07/11/2017 10:06 AM  Note Status: Signed Cosign: Cosign Not Required Encounter Date: 07/10/2017  Editor: Joseph Art        Scan on 07/11/2017 10:06 AM by Georgina Peer N: [facility]  Progress Notes by Lelon Perla, MD at 07/10/2017 12:20 PM   Author: Lelon Perla, MD Author Type: Physician Filed: 07/10/2017 1:12 PM  Note Status: Addendum Cosign: Cosign Not Required Encounter Date: 07/10/2017  Editor: Lelon Perla, MD (Physician)  Prior Versions: 1. Lelon Perla, MD (Physician) at 07/10/2017 12:59 PM - Signed         HPI: FUAS/MR,atrial fibrillation and CAD.Cardiac catheterization 2007 showed ejection fraction 50-55%, 100% mid to distal obtuse marginal. Patient had PCI of the OM at that time. 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 Also with hyponatremia.Then seen 05/24/17 with atrial fibrillation (HR felt to be elevated by urology but 100 upon eval in ER). Last echo 12/18 showed normal LV function, severe AS with mean gradient 44 mmHg, severe LAE, flail MV with severe MS (mean gradient 11 mmHg) and severe MR, mild to moderate TR. Patient seen in the emergency room January 14 with complaints of dyspnea.   Laboratories showed sodium 130, BUN 13 and creatinine 0.82, hemoglobin 11.9, BNP 442, and chest x-ray showed mild edema with small effusions.  Patient also complained of elevated heart rate.  Metoprolol was increased.  Since last seen, patient has noticed increased dyspnea on exertion and what sounds to be orthopnea.  He has noticed increasing pedal edema.  He denies chest pain or syncope.  He states he is passing his urine.        Current Outpatient Medications  Medication Sig Dispense Refill  . alfuzosin (UROXATRAL) 10 MG 24 hr tablet Take 10 mg by mouth daily with breakfast.    . cholecalciferol 2000 units TABS Take 1 tablet (2,000 Units total) by mouth daily. 30 tablet 0  . digoxin (LANOXIN) 0.125 MG tablet TAKE ONE TABLET BY MOUTH ONCE DAILY (Patient taking differently: TAKE ONE TABLET (121mcg) BY MOUTH ONCE DAILY) 90 tablet 3  . finasteride (PROSCAR) 5 MG tablet Take 1 tablet (5 mg total) by mouth daily.    . furosemide (LASIX) 20 MG tablet Take 1 tablet (20 mg total) by mouth as needed for fluid or edema (take one tablet a day if your weight increases more than 2 pounds in 24 hours.). (Patient taking differently: Take 20 mg by mouth daily. ) 30 tablet 6  . methocarbamol (ROBAXIN) 500 MG tablet Take 500 mg by  mouth every 6 (six) hours as needed (cramps).     . metoprolol tartrate (LOPRESSOR) 25 MG tablet TAKE ONE TABLET (25mg ) BY MOUTH TWICE DAILY. 135 tablet 3  . polyethylene glycol (MIRALAX / GLYCOLAX) packet Take 17 g by mouth daily as needed.     . potassium chloride (K-DUR) 10 MEQ tablet Take 1 tablet (10 mEq total) by mouth daily as needed. Take potasium if you take lasix. (Patient taking differently: Take 10 mEq by mouth daily. Take potasium if you take lasix.) 30 tablet 2  . warfarin (COUMADIN) 2.5 MG tablet TAKE 1 TO 2 TABLETS BY MOUTH ONCE DAILY AS DIRECTED BY  COUMADIN  CLINIC (Patient taking differently: Take 2.5-5 mg by mouth See admin instructions. 5mg  on [facility] and  2.5mg  on T SAT) 135 tablet 0   No current facility-administered medications for this visit.          Past Medical History:  Diagnosis Date  . Aortic stenosis   . Arthritis   . Atrial fibrillation (HCC)    Chronic  . BPH (benign prostatic hyperplasia)   . CAD (coronary artery disease)   . Chronic anticoagulation   . History of chicken pox   . HTN (hypertension)   . Mitral stenosis   . Old MI (myocardial infarction) 2007   s/p PCI to distal OM (no stent)  . Stroke, embolic [facility])          Past Surgical History:  Procedure Laterality Date  . CORONARY ANGIOPLASTY  2007   Distal OM  . PROSTATE BIOPSY      Social History        Socioeconomic History  . Marital status: Married    Spouse name: Not on file  . Number of children: 3  . Years of education: 48  . Highest education level: Not on file  Social Needs  . Financial resource strain: Not on file  . Food insecurity - worry: Not on file  . Food insecurity - inability: Not on file  . Transportation needs - medical: Not on file  . Transportation needs - non-medical: Not on file  Occupational History  . Not on file  Tobacco Use  . Smoking status: Never Smoker  . Smokeless tobacco: Never Used  Substance and Sexual Activity  . Alcohol use: No  . Drug use: No  . Sexual activity: Not on file  Other Topics Concern  . Not on file  Social History Narrative   Fun: Garden, work in the yard, anything physical and walk daily         Family History  Problem Relation Age of Onset  . Heart disease Mother   . Heart attack Mother   . Heart disease Father   . Heart attack Father   . Heart attack Brother   . Heart attack Sister   . Stroke Brother     ROS: no fevers or chills, productive cough, hemoptysis, dysphasia, odynophagia, melena, hematochezia, dysuria, hematuria, rash, seizure activity, orthopnea, PND, claudication. Remaining systems are negative.  Physical  Exam: Well-developed well-nourished in no acute distress.  Skin is warm and dry.  HEENT is normal.  Neck is supple.  Chest is clear to auscultation with normal expansion.  Cardiovascular exam is irregular, 3/6 systolic murmur Abdominal exam nontender or distended. No masses palpated. Extremities show 2+ edema. neuro grossly intact  ECG-atrial fibrillation at a rate of 78.  No ST changes.  Personally reviewed  A/P  1 permanent atrial fibrillation-patient's  heart rate appears to be controlled.  Continue digoxin and metoprolol.  Continue Coumadin.    2 severe aortic stenosis-patient has severe aortic stenosis and severe mitral regurgitation with some degree of mitral stenosis.  Long discussion in the office today concerning options.  He is nearing 84 years old.  He is clear that he would not consider conventional aortic valve/mitral valve replacement.  He would potentially consider TAVR if he were felt to be a candidate.  AVR would not address his mitral valve although hopefully mitral regurgitation would improve if aortic stenosis is corrected.  I will ask Dr. Burt Knack to review his previous transthoracic.  If he is felt potentially to be a candidate we could perform a TEE to more fully assess and he would also require cardiac catheterization prior to the procedure.  He is willing to consider this.  In the meantime I will increase his Lasix to 40 mg twice daily for 3 days and then 40 mg daily thereafter.  Check potassium and renal function in 2 weeks.  Follow-up with me in 2 weeks.  3 coronary artery disease-patient has declined statins previously.  Not on aspirin given need for Coumadin.  4 hypertension-blood pressure is controlled.  Continue present medications.  5 edema-edema is worse today.  There may be a contribution from urinary retention but I am now concerned his valvular heart disease is causing this recent exacerbation.  Plan as outlined under aortic stenosis.  I spent 30  minutes discussing options with patient and his wife in the office today. Kirk Ruths, MD     For TEE to assess aortic and mitral valves; no changes Kirk Ruths

## 2017-07-20 NOTE — Telephone Encounter (Signed)
S/w wife all questions answered.

## 2017-07-20 NOTE — Interval H&P Note (Signed)
History and Physical Interval Note:  07/20/2017 2:33 PM  Wayne [name]  has presented today for surgery, with the diagnosis of AORTIC STENOSIS  The various methods of treatment have been discussed with the patient and family. After consideration of risks, benefits and other options for treatment, the patient has consented to  Procedure(s): TRANSESOPHAGEAL ECHOCARDIOGRAM (TEE) (N/A) as a surgical intervention .  The patient's history has been reviewed, patient examined, no change in status, stable for surgery.  I have reviewed the patient's chart and labs.  Questions were answered to the patient's satisfaction.     Kirk Ruths

## 2017-07-20 NOTE — Progress Notes (Signed)
  Echocardiogram Echocardiogram Transesophageal has been performed.  Wayne [name] 07/20/2017, 3:39 PM

## 2017-07-22 ENCOUNTER — Encounter (HOSPITAL_COMMUNITY): Payer: Self-pay | Admitting: Cardiology

## 2017-07-23 ENCOUNTER — Telehealth: Payer: Self-pay | Admitting: Cardiology

## 2017-07-23 NOTE — Telephone Encounter (Signed)
New message     [facility] Pre-operative Risk Assessment    Request for surgical clearance:  1. What type of surgery is being performed? Tooth Extraction  2. When is this surgery scheduled? Waiting for clearance  3. What type of clearance is required (medical clearance vs. Pharmacy clearance to hold med vs. Both)? both  4. Are there any medications that need to be held prior to surgery and how long? Anything that causes bleeding  5. Practice name and name of physician performing surgery? [facility]/ Dr. Daneen Schick  6. What is your office phone and fax number? [phone] fax: [phone]  7. Anesthesia type (None, local, MAC, general) ? none   Wayne [name] 07/23/2017, 11:38 AM  _________________________________________________________________   (provider comments below)

## 2017-07-24 ENCOUNTER — Telehealth: Payer: Self-pay | Admitting: Cardiology

## 2017-07-24 ENCOUNTER — Other Ambulatory Visit: Payer: Self-pay | Admitting: Cardiology

## 2017-07-24 ENCOUNTER — Encounter: Payer: Self-pay | Admitting: Cardiology

## 2017-07-24 ENCOUNTER — Ambulatory Visit (INDEPENDENT_AMBULATORY_CARE_PROVIDER_SITE_OTHER): Payer: Medicare HMO | Admitting: Cardiology

## 2017-07-24 VITALS — BP 120/68 | HR 60 | Ht 70.5 in | Wt 174.8 lb

## 2017-07-24 DIAGNOSIS — I350 Nonrheumatic aortic (valve) stenosis: Secondary | ICD-10-CM

## 2017-07-24 DIAGNOSIS — I2510 Atherosclerotic heart disease of native coronary artery without angina pectoris: Secondary | ICD-10-CM | POA: Diagnosis not present

## 2017-07-24 DIAGNOSIS — I10 Essential (primary) hypertension: Secondary | ICD-10-CM

## 2017-07-24 DIAGNOSIS — I5033 Acute on chronic diastolic (congestive) heart failure: Secondary | ICD-10-CM | POA: Diagnosis not present

## 2017-07-24 MED ORDER — WARFARIN SODIUM 2.5 MG PO TABS: ORAL_TABLET | ORAL | 0 refills | Status: DC

## 2017-07-24 NOTE — Telephone Encounter (Signed)
FOLLOW UP     *STAT* If patient is at the pharmacy, call can be transferred to refill team.   1. Which medications need to be refilled? (please list name of each medication and dose if known) potassium chloride (K-DUR) 10 MEQ tablet  2. Which pharmacy/location (including street and city if local pharmacy) is medication to be sent to?[place] ([place]), [place] - [address]  3. Do they need a 30 day or 90 day supply? [place]

## 2017-07-24 NOTE — Telephone Encounter (Signed)
NEW MESSAGE  REFILL NEEDED   If Home Health RN is calling please get Coumadin Nurse on the phone STAT  1.  Are you calling in regards to an appointment? no  2.  Are you calling for a refill ? yes  3.  Are you having bleeding issues? no  4.  Do you need clearance to hold Coumadin? no   Please route to the Coumadin Clinic Pool

## 2017-07-24 NOTE — Telephone Encounter (Signed)
P's wife called to schedule an appt for TAVR clinic will forward to [place] to schedule./cy

## 2017-07-24 NOTE — Telephone Encounter (Signed)
   Primary Cardiologist: Kirk Ruths, MD  Chart reviewed as part of pre-operative protocol coverage.  The patient was last seen today by Dr. Stanford Breed.  I reviewed his case with Dr. Stanford Breed by phone.  The patient does not need any further testing and may proceed with dental extraction.  Preference would be for the patient to remain on Coumadin for his procedure if at all possible.  If Coumadin needs to be held, he will need Lovenox bridging due to prior history of stroke.  I have contacted the dentist's office today.  He can have his extraction on Coumadin.  I have faxed a letter to the dentist's office.  Please contact Dr. Kathaleen Grinder office and ensure that my letter was received today. (Fax [phone])  Richardson Dopp, PA-C 07/24/2017, 4:28 PM

## 2017-07-24 NOTE — Patient Instructions (Signed)
Medication Instructions:   TAKE FUROSEMIDE 80 MG ONCE DAILY X 3 DAYS THEN DECREASE TO 40 MG ONCE DAILY  INCREASE POTASSIUM TO 1 TABLET TWICE DAILY X 3 DAYS AND THEN TAKE 1 TABLET DAILY  Labwork:  Your physician recommends that you return for lab work in: ONE WEEK  Follow-Up:  Your physician recommends that you schedule a follow-up appointment in: 3 [place] TO DISCUSS TAVR

## 2017-07-24 NOTE — Telephone Encounter (Signed)
New Message   Patients wife is returning call in reference to an appointment with TAVR. Please call to discuss.

## 2017-07-24 NOTE — Telephone Encounter (Signed)
Wayne [name] calling to check on the status of pt's clearance.She says she needs this asap please.

## 2017-07-24 NOTE — Telephone Encounter (Signed)
Per Richardson Dopp PAC's note, called requesting provider and asked if they had received letter. Threasa Beards stated she had not received it yet. I printed the letter and hard faxed it over to [phone] which was the fax number verified by Melanie at the requesting provider's office.

## 2017-07-24 NOTE — Telephone Encounter (Signed)
I spoke with the pt's wife and scheduled a TAVR consult on 07/26/17 with Dr Burt Knack.

## 2017-07-25 NOTE — H&P (View-Only) (Signed)
Cardiology Office Note Date:  07/26/2017   ID:  Thanos Cousineau, DOB August 24, 1929, MRN [id number]  PCP:  [name], DO  Cardiologist:  Kirk Ruths, MD  Chief Complaint  Patient presents with  . Shortness of Breath   History of Present Illness: Wayne [name] is a 84 y.o. male who presents for evaluation of aortic stenosis and mitral regurgitation.  The patient has long-standing permanent atrial fibrillation on chronic warfarin.  He has a history of coronary artery disease and underwent balloon angioplasty of an obtuse marginal branch of the circumflex approximately 12 years ago when he presented with non-ST elevation infarction.  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  The patient has been followed regularly with stable cardiac status for several years.  He was admitted in December 2018 with urinary retention and during that hospitalization he developed weight gain and edema consistent with congestive heart failure.  He was found by echo criteria to have progression of aortic stenosis into the severe range as well as severe mitral regurgitation and mitral stenosis.  The patient is here with his wife today. They live independently in [facility]. He ambulates with a cane and a walker ever since his stroke in 2007. He walks about 20 minutes in [place] and uses the cart for stability as often as he can.  The patient admits to easy bruising.  He has not had any falls.  He complains of progressive exertional dyspnea over the last 6 months, now limited with low-level activity. He also complains of orthopnea, no PND. Also complains of leg swelling. He's been adjusting his diuretics based on degree of swelling.  He admits to having a difficult time sleeping because of shortness of breath.  States that he does not take his diuretics regularly enough.  Past Medical History:  Diagnosis Date  . Aortic stenosis   . Arthritis   .  Atrial fibrillation (HCC)    Chronic  . Atrial fibrillation, chronic ([place])   . BPH (benign prostatic hyperplasia)   . CAD (coronary artery disease)   . Chronic anticoagulation   . History of chicken pox   . HTN (hypertension)   . Mitral stenosis   . Old MI (myocardial infarction) 2007   s/p PCI to distal OM (no stent)  . Stroke, embolic [facility])     Past Surgical History:  Procedure Laterality Date  . CORONARY ANGIOPLASTY  2007   Distal OM  . PROSTATE BIOPSY    . TEE WITHOUT CARDIOVERSION N/A 07/20/2017   Procedure: TRANSESOPHAGEAL ECHOCARDIOGRAM (TEE);  Surgeon: Lelon Perla, MD;  Location: [facility] ENDOSCOPY;  Service: Cardiovascular;  Laterality: N/A;    Current Outpatient Medications  Medication Sig Dispense Refill  . acetaminophen (TYLENOL) 500 MG tablet Take 1,000 mg by mouth every 6 (six) hours as needed for moderate pain or headache.    . alfuzosin (UROXATRAL) 10 MG 24 hr tablet Take 10 mg by mouth daily with breakfast.    . ARTIFICIAL TEAR OP Place 1 drop into both eyes daily as needed (dry eyes).    . calcium carbonate (TUMS - DOSED IN MG ELEMENTAL CALCIUM) 500 MG chewable tablet Chew 1-2 tablets by mouth daily as needed for indigestion or heartburn.    . cholecalciferol 2000 units TABS Take 1 tablet (2,000 Units total) by mouth daily. 30 tablet 0  . digoxin (LANOXIN) 0.125 MG tablet TAKE ONE TABLET BY MOUTH ONCE DAILY (Patient taking  differently: TAKE ONE TABLET (167mcg) BY MOUTH ONCE DAILY) 90 tablet 3  . finasteride (PROSCAR) 5 MG tablet Take 1 tablet (5 mg total) by mouth daily.    . furosemide (LASIX) 20 MG tablet Take 2 tablets (40 mg total) by mouth daily. 90 tablet 3  . Melatonin 1 MG TABS Take 1 mg by mouth at bedtime as needed (sleep).    . Menthol, Topical Analgesic, (BIOFREEZE EX) Apply 1 application topically daily as needed (muscle pain).    . methocarbamol (ROBAXIN) 500 MG tablet Take 500 mg by mouth daily as needed (cramps).     . metoprolol tartrate  (LOPRESSOR) 25 MG tablet TAKE ONE TABLET (25mg ) BY MOUTH TWICE DAILY. 135 tablet 3  . polyethylene glycol (MIRALAX / GLYCOLAX) packet Take 17 g by mouth daily as needed for mild constipation.     . potassium chloride (K-DUR) 10 MEQ tablet Take 1 tablet (10 mEq total) by mouth daily as needed. Take potasium if you take lasix. (Patient taking differently: Take 10 mEq by mouth daily. Take potasium if you take lasix.) 30 tablet 2  . sodium chloride (OCEAN) 0.65 % SOLN nasal spray Place 1 spray into both nostrils daily as needed for congestion.    . triamcinolone cream (KENALOG) 0.1 % Apply 1 application topically daily as needed (dry skin).    Marland Kitchen warfarin (COUMADIN) 2.5 MG tablet TAKE 1 TO 2 TABLETS BY MOUTH ONCE DAILY AS DIRECTED BY  COUMADIN  CLINIC 180 tablet 0   No current facility-administered medications for this visit.     Allergies:   Prednisone; Procaine hcl; Levofloxacin; Oxycodone; and Penicillins   Social History:  The patient  reports that  has never smoked. he has never used smokeless tobacco. He reports that he does not drink alcohol or use drugs.   Family History:  The patient's family history includes Heart attack in his brother, father, mother, and sister; Heart disease in his father and mother; Stroke in his brother.    ROS:  Please see the history of present illness.  Otherwise, review of systems is positive for hearing loss, cough, muscle pain, easy bruising, leg pain, snoring.  All other systems are reviewed and negative.    PHYSICAL EXAM: VS:  BP 110/62   Pulse 72   Ht 5' 10.5" (1.791 m)   Wt 165 lb (74.8 kg)   SpO2 97%   BMI 23.34 kg/m  , BMI Body mass index is 23.34 kg/m. GEN: Pleasant elderly male, in no acute distress  HEENT: normal  Neck: no JVD, no masses. No carotid bruits Cardiac: RRR with 3/6 late peaking crescendo decrescendo murmur heard best at the apex, absent A2                Respiratory:  clear to auscultation bilaterally, normal work of  breathing GI: soft, nontender, nondistended, + BS MS: no deformity or atrophy  Ext: 2+ bilateral pretibial edema, pedal pulses 2+= bilaterally Skin: warm and dry, no rash Neuro:  Strength and sensation are intact Psych: euthymic mood, full affect  EKG:  EKG is not ordered today.  Recent Labs: 09/13/2016: Magnesium 2.4 05/20/2017: TSH 2.425 05/21/2017: ALT 19 07/02/2017: B Natriuretic Peptide 442.5; Hemoglobin 11.9; Platelets 156 07/17/2017: BUN 16; Creatinine, Ser 0.94; Potassium 4.4; Sodium 138   Lipid Panel     Component Value Date/Time   CHOL 176 01/27/2016 0938   TRIG 274 (H) 01/27/2016 0938   HDL 29 (L) 01/27/2016 0938   CHOLHDL 6.1 (H) 01/27/2016  8527   VLDL 55 (H) 01/27/2016 0938   LDLCALC 92 01/27/2016 0938   LDLDIRECT 87.0 05/18/2014 0922      Wt Readings from Last 3 Encounters:  07/26/17 165 lb (74.8 kg)  07/24/17 174 lb 12.8 oz (79.3 kg)  07/10/17 178 lb (80.7 kg)     Cardiac Studies Reviewed: Echo: Left ventricle:  The cavity size was normal. There was severe focal basal hypertrophy of the septum with otherwise mild concentric hypertrophy. Systolic function was normal. Wall motion was normal; there were no regional wall motion abnormalities. The study was not technically sufficient to allow evaluation of LV diastolic dysfunction due to atrial fibrillation.  ------------------------------------------------------------------- Aortic valve:   Trileaflet; severely thickened, severely calcified leaflets. Valve mobility was restricted.  Doppler:   There was severe stenosis.   There was no regurgitation.    VTI ratio of LVOT to aortic valve: 0.14. Valve area (VTI): 0.49 cm^2. Indexed valve area (VTI): 0.25 cm^2/m^2. Peak velocity ratio of LVOT to aortic valve: 0.13. Valve area (Vmax): 0.47 cm^2. Indexed valve area (Vmax): 0.24 cm^2/m^2. Mean velocity ratio of LVOT to aortic valve: 0.15. Valve area (Vmean): 0.53 cm^2. Indexed valve area (Vmean): 0.27 cm^2/m^2.     Mean gradient (S): 44 mm Hg. Peak gradient (S): 80 mm Hg.  ------------------------------------------------------------------- Aorta:  Aortic root: The aortic root was normal in size.  ------------------------------------------------------------------- Mitral valve:   Severely calcified annulus.  Calcification. Calcification. Mobility was restricted. Flail motion involving the anterior leaflet.  Doppler:   The findings are consistent with severe stenosis.   There was severe regurgitation directed eccentrically and posteriorly.    Valve area by pressure half-time: 1.63 cm^2. Indexed valve area by pressure half-time: 0.83 cm^2/m^2. Valve area by continuity equation (using LVOT flow): 0.98 cm^2. Indexed valve area by continuity equation (using LVOT flow): 0.5 cm^2/m^2.    Mean gradient (D): 11 mm Hg. Peak gradient (D): 19 mm Hg.  ------------------------------------------------------------------- Left atrium:  The atrium was severely dilated.  ------------------------------------------------------------------- Right ventricle:  The cavity size was normal. Wall thickness was normal. Systolic function was normal.  ------------------------------------------------------------------- Pulmonic valve:    Structurally normal valve.   Cusp separation was normal.  Doppler:  Transvalvular velocity was within the normal range. There was no evidence for stenosis. There was no regurgitation.  ------------------------------------------------------------------- Tricuspid valve:   Structurally normal valve.    Doppler: Transvalvular velocity was within the normal range. There was mild-moderate regurgitation.  ------------------------------------------------------------------- Pulmonary artery:   The main pulmonary artery was normal-sized. Systolic pressure was mildly increased.  ------------------------------------------------------------------- Right atrium:  The atrium was severely  dilated.  ------------------------------------------------------------------- Pericardium:  There was no pericardial effusion.  ------------------------------------------------------------------- Systemic veins: Inferior vena cava: The vessel was normal in size. The respirophasic diameter changes were in the normal range (>= 50%), consistent with normal central venous pressure.  TEE: Study Conclusions  - Left ventricle: Hypertrophy was noted. Systolic function was   normal. The estimated ejection fraction was in the range of 55%   to 60%. - Aortic valve: Cusp separation was severely reduced. There was   severe stenosis. - Mitral valve: Mildly calcified annulus. Prolapse, involving the   anterior leaflet. There was severe regurgitation directed   eccentrically. - Left atrium: The atrium was severely dilated. No evidence of   thrombus in the atrial cavity or appendage. There was spontaneous   echo contrast (&quot;smoke&quot;). - Right atrium: The atrium was moderately dilated. - Atrial septum: No defect or patent foramen ovale was identified. - Tricuspid  valve: Mild prolapse. No evidence of vegetation. - Pulmonic valve: No evidence of vegetation. - Pericardium, extracardiac: A small pericardial effusion was   identified.  Impressions:  - Hypokinesis of the inferolateral wall with overall preserved LV   function; calcified aortic valve with severe AS (mean gradient 56   mmHg); probable ruptured chord of anterior MV leaflet with severe   eccentric MR directed posterolaterally; severe LAE; no LAA   thrombus; moderate RVE; small pericardial effusion; moderate   atherosclerosis descending aorta.  STS Risk Calculator (isolated AVR): Procedure: Isolated AVR   Risk of Mortality: 3.220%  Renal Failure: 4.034%  Permanent Stroke: 3.122%  Prolonged Ventilation: 12.855%  DSW Infection: 0.070%  Reoperation: 5.522%  Morbidity or Mortality: 25.389%  Short Length of Stay:  18.961%  Long Length of Stay: 14.652%   ASSESSMENT AND PLAN: 84 year old gentleman with severe, stage D1, symptomatic aortic stenosis and mitral regurgitation.  The patient notes progressive symptoms of dyspnea, edema, and orthopnea consistent with acute on chronic diastolic heart failure, NYHA functional class IIIb.  I have personally reviewed the patient's echo and TEE images.  Overall LV systolic function is preserved.  There is clear evidence of severe aortic stenosis.  The aortic valve appears to be trileaflet and there is severe calcification and restriction of all 3 leaflets.  The mean transvalvular gradient is 55 mmHg by TEE.  The mitral valve annulus is calcified and there is severe posteriorly directed mitral regurgitation.  There appears to be a flail segment of the anterior leaflet of the mitral valve possibly secondary to a ruptured cord.  The most definitive treatment for the patient's severe aortic stenosis and mitral regurgitation would be conventional surgical AVR, MV repair, Maze, +/- CABG. At age 82 with hx of stroke with residual right sided weakness and gait instability, permanent atrial fibrillation, CAD, and chronic diastolic heart failure, I doubt he will be a candidate for conventional heart surgery under any circumstances. It seems reasonable to consider TAVR for treatment of his severe aortic stenosis. I would not expect the patient's mitral regurgitation to improve significantly after TAVR since it appears to be primary rather than secondary mitral regurgitation. However, I suspect treatment of his aortic stenosis would significantly improve his symptoms as he has very severe aortic stenosis. If he were to have persistent symptoms related to severe mitral regurgitation following TAVR, edge-to-edge mitral approximation could be considered if his valve is amenable. I'm concerned that he may not have a sufficient valve area to pursue this therapy.   I have discussed TAVR and the  anticipated evaluation at length with the patient and his wife today.  We reviewed necessary testing including diagnostic right and left heart catheterization, CT angiogram studies, and formal cardiac surgical consultation.  The patient understands the serious risks of TAVR include stroke, major vascular injury, myocardial infarction, need for permanent pacemaker, and death.  He understands these risks occur at low frequency and he would like to proceed with further evaluation.  I have also reviewed the risks, indications, and alternatives to cardiac catheterization, possible angioplasty, and stenting with the patient. Risks include but are not limited to bleeding, infection, vascular injury, stroke, myocardial infection, arrhythmia, kidney injury, radiation-related injury in the case of prolonged fluoroscopy use, emergency cardiac surgery, and death. The patient understands the risks of serious complication is 1-2 in 3825 with diagnostic cardiac cath and 1-2% or less with angioplasty/stenting. Following cardiac catheterization, I am going to refer him to multidisciplinary evaluation by cardiac surgery for further  evaluation of treatment options related to his valvular heart disease.   Will bridge him with lovenox for upcoming procedures considering his hx of stroke, permanent atrial fibrillation, and LA 'smoke' seen on TEE.  Current medicines are reviewed with the patient today.  The patient does not have concerns regarding medicines.  Labs/ tests ordered today include:  No orders of the defined types were placed in this encounter.  Signed, Sherren Mocha, MD  07/26/2017 9:44 AM    [facility] [address], [place], [place]  [address] Phone: [phone]; Fax: [phone]

## 2017-07-25 NOTE — Telephone Encounter (Signed)
Received read receipt with ok for hard fax sent with from [facility] regarding Coumadin.

## 2017-07-25 NOTE — Progress Notes (Signed)
Cardiology Office Note Date:  07/26/2017   ID:  Wayne [name], DOB 09/21/1929, MRN [id number]  PCP:  [name], DO  Cardiologist:  Kirk Ruths, MD  Chief Complaint  Patient presents with  . Shortness of Breath   History of Present Illness: Wayne [name] is a 84 y.o. male who presents for evaluation of aortic stenosis and mitral regurgitation.  The patient has long-standing permanent atrial fibrillation on chronic warfarin.  He has a history of coronary artery disease and underwent balloon angioplasty of an obtuse marginal branch of the circumflex approximately 12 years ago when he presented with non-ST elevation infarction.  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  The patient has been followed regularly with stable cardiac status for several years.  He was admitted in December 2018 with urinary retention and during that hospitalization he developed weight gain and edema consistent with congestive heart failure.  He was found by echo criteria to have progression of aortic stenosis into the severe range as well as severe mitral regurgitation and mitral stenosis.  The patient is here with his wife today. They live independently in [facility]. He ambulates with a cane and a walker ever since his stroke in 2007. He walks about 20 minutes in [place] and uses the cart for stability as often as he can.  The patient admits to easy bruising.  He has not had any falls.  He complains of progressive exertional dyspnea over the last 6 months, now limited with low-level activity. He also complains of orthopnea, no PND. Also complains of leg swelling. He's been adjusting his diuretics based on degree of swelling.  He admits to having a difficult time sleeping because of shortness of breath.  States that he does not take his diuretics regularly enough.  Past Medical History:  Diagnosis Date  . Aortic stenosis   . Arthritis   .  Atrial fibrillation (HCC)    Chronic  . Atrial fibrillation, chronic ([place])   . BPH (benign prostatic hyperplasia)   . CAD (coronary artery disease)   . Chronic anticoagulation   . History of chicken pox   . HTN (hypertension)   . Mitral stenosis   . Old MI (myocardial infarction) 2007   s/p PCI to distal OM (no stent)  . Stroke, embolic [facility])     Past Surgical History:  Procedure Laterality Date  . CORONARY ANGIOPLASTY  2007   Distal OM  . PROSTATE BIOPSY    . TEE WITHOUT CARDIOVERSION N/A 07/20/2017   Procedure: TRANSESOPHAGEAL ECHOCARDIOGRAM (TEE);  Surgeon: Lelon Perla, MD;  Location: [facility] ENDOSCOPY;  Service: Cardiovascular;  Laterality: N/A;    Current Outpatient Medications  Medication Sig Dispense Refill  . acetaminophen (TYLENOL) 500 MG tablet Take 1,000 mg by mouth every 6 (six) hours as needed for moderate pain or headache.    . alfuzosin (UROXATRAL) 10 MG 24 hr tablet Take 10 mg by mouth daily with breakfast.    . ARTIFICIAL TEAR OP Place 1 drop into both eyes daily as needed (dry eyes).    . calcium carbonate (TUMS - DOSED IN MG ELEMENTAL CALCIUM) 500 MG chewable tablet Chew 1-2 tablets by mouth daily as needed for indigestion or heartburn.    . cholecalciferol 2000 units TABS Take 1 tablet (2,000 Units total) by mouth daily. 30 tablet 0  . digoxin (LANOXIN) 0.125 MG tablet TAKE ONE TABLET BY MOUTH ONCE DAILY (Patient taking  differently: TAKE ONE TABLET (129mcg) BY MOUTH ONCE DAILY) 90 tablet 3  . finasteride (PROSCAR) 5 MG tablet Take 1 tablet (5 mg total) by mouth daily.    . furosemide (LASIX) 20 MG tablet Take 2 tablets (40 mg total) by mouth daily. 90 tablet 3  . Melatonin 1 MG TABS Take 1 mg by mouth at bedtime as needed (sleep).    . Menthol, Topical Analgesic, (BIOFREEZE EX) Apply 1 application topically daily as needed (muscle pain).    . methocarbamol (ROBAXIN) 500 MG tablet Take 500 mg by mouth daily as needed (cramps).     . metoprolol tartrate  (LOPRESSOR) 25 MG tablet TAKE ONE TABLET (25mg ) BY MOUTH TWICE DAILY. 135 tablet 3  . polyethylene glycol (MIRALAX / GLYCOLAX) packet Take 17 g by mouth daily as needed for mild constipation.     . potassium chloride (K-DUR) 10 MEQ tablet Take 1 tablet (10 mEq total) by mouth daily as needed. Take potasium if you take lasix. (Patient taking differently: Take 10 mEq by mouth daily. Take potasium if you take lasix.) 30 tablet 2  . sodium chloride (OCEAN) 0.65 % SOLN nasal spray Place 1 spray into both nostrils daily as needed for congestion.    . triamcinolone cream (KENALOG) 0.1 % Apply 1 application topically daily as needed (dry skin).    Marland Kitchen warfarin (COUMADIN) 2.5 MG tablet TAKE 1 TO 2 TABLETS BY MOUTH ONCE DAILY AS DIRECTED BY  COUMADIN  CLINIC 180 tablet 0   No current facility-administered medications for this visit.     Allergies:   Prednisone; Procaine hcl; Levofloxacin; Oxycodone; and Penicillins   Social History:  The patient  reports that  has never smoked. he has never used smokeless tobacco. He reports that he does not drink alcohol or use drugs.   Family History:  The patient's family history includes Heart attack in his brother, father, mother, and sister; Heart disease in his father and mother; Stroke in his brother.    ROS:  Please see the history of present illness.  Otherwise, review of systems is positive for hearing loss, cough, muscle pain, easy bruising, leg pain, snoring.  All other systems are reviewed and negative.    PHYSICAL EXAM: VS:  BP 110/62   Pulse 72   Ht 5' 10.5" (1.791 m)   Wt 165 lb (74.8 kg)   SpO2 97%   BMI 23.34 kg/m  , BMI Body mass index is 23.34 kg/m. GEN: Pleasant elderly male, in no acute distress  HEENT: normal  Neck: no JVD, no masses. No carotid bruits Cardiac: RRR with 3/6 late peaking crescendo decrescendo murmur heard best at the apex, absent A2                Respiratory:  clear to auscultation bilaterally, normal work of  breathing GI: soft, nontender, nondistended, + BS MS: no deformity or atrophy  Ext: 2+ bilateral pretibial edema, pedal pulses 2+= bilaterally Skin: warm and dry, no rash Neuro:  Strength and sensation are intact Psych: euthymic mood, full affect  EKG:  EKG is not ordered today.  Recent Labs: 09/13/2016: Magnesium 2.4 05/20/2017: TSH 2.425 05/21/2017: ALT 19 07/02/2017: B Natriuretic Peptide 442.5; Hemoglobin 11.9; Platelets 156 07/17/2017: BUN 16; Creatinine, Ser 0.94; Potassium 4.4; Sodium 138   Lipid Panel     Component Value Date/Time   CHOL 176 01/27/2016 0938   TRIG 274 (H) 01/27/2016 0938   HDL 29 (L) 01/27/2016 0938   CHOLHDL 6.1 (H) 01/27/2016  3710   VLDL 55 (H) 01/27/2016 0938   LDLCALC 92 01/27/2016 0938   LDLDIRECT 87.0 05/18/2014 0922      Wt Readings from Last 3 Encounters:  07/26/17 165 lb (74.8 kg)  07/24/17 174 lb 12.8 oz (79.3 kg)  07/10/17 178 lb (80.7 kg)     Cardiac Studies Reviewed: Echo: Left ventricle:  The cavity size was normal. There was severe focal basal hypertrophy of the septum with otherwise mild concentric hypertrophy. Systolic function was normal. Wall motion was normal; there were no regional wall motion abnormalities. The study was not technically sufficient to allow evaluation of LV diastolic dysfunction due to atrial fibrillation.  ------------------------------------------------------------------- Aortic valve:   Trileaflet; severely thickened, severely calcified leaflets. Valve mobility was restricted.  Doppler:   There was severe stenosis.   There was no regurgitation.    VTI ratio of LVOT to aortic valve: 0.14. Valve area (VTI): 0.49 cm^2. Indexed valve area (VTI): 0.25 cm^2/m^2. Peak velocity ratio of LVOT to aortic valve: 0.13. Valve area (Vmax): 0.47 cm^2. Indexed valve area (Vmax): 0.24 cm^2/m^2. Mean velocity ratio of LVOT to aortic valve: 0.15. Valve area (Vmean): 0.53 cm^2. Indexed valve area (Vmean): 0.27 cm^2/m^2.     Mean gradient (S): 44 mm Hg. Peak gradient (S): 80 mm Hg.  ------------------------------------------------------------------- Aorta:  Aortic root: The aortic root was normal in size.  ------------------------------------------------------------------- Mitral valve:   Severely calcified annulus.  Calcification. Calcification. Mobility was restricted. Flail motion involving the anterior leaflet.  Doppler:   The findings are consistent with severe stenosis.   There was severe regurgitation directed eccentrically and posteriorly.    Valve area by pressure half-time: 1.63 cm^2. Indexed valve area by pressure half-time: 0.83 cm^2/m^2. Valve area by continuity equation (using LVOT flow): 0.98 cm^2. Indexed valve area by continuity equation (using LVOT flow): 0.5 cm^2/m^2.    Mean gradient (D): 11 mm Hg. Peak gradient (D): 19 mm Hg.  ------------------------------------------------------------------- Left atrium:  The atrium was severely dilated.  ------------------------------------------------------------------- Right ventricle:  The cavity size was normal. Wall thickness was normal. Systolic function was normal.  ------------------------------------------------------------------- Pulmonic valve:    Structurally normal valve.   Cusp separation was normal.  Doppler:  Transvalvular velocity was within the normal range. There was no evidence for stenosis. There was no regurgitation.  ------------------------------------------------------------------- Tricuspid valve:   Structurally normal valve.    Doppler: Transvalvular velocity was within the normal range. There was mild-moderate regurgitation.  ------------------------------------------------------------------- Pulmonary artery:   The main pulmonary artery was normal-sized. Systolic pressure was mildly increased.  ------------------------------------------------------------------- Right atrium:  The atrium was severely  dilated.  ------------------------------------------------------------------- Pericardium:  There was no pericardial effusion.  ------------------------------------------------------------------- Systemic veins: Inferior vena cava: The vessel was normal in size. The respirophasic diameter changes were in the normal range (>= 50%), consistent with normal central venous pressure.  TEE: Study Conclusions  - Left ventricle: Hypertrophy was noted. Systolic function was   normal. The estimated ejection fraction was in the range of 55%   to 60%. - Aortic valve: Cusp separation was severely reduced. There was   severe stenosis. - Mitral valve: Mildly calcified annulus. Prolapse, involving the   anterior leaflet. There was severe regurgitation directed   eccentrically. - Left atrium: The atrium was severely dilated. No evidence of   thrombus in the atrial cavity or appendage. There was spontaneous   echo contrast (&quot;smoke&quot;). - Right atrium: The atrium was moderately dilated. - Atrial septum: No defect or patent foramen ovale was identified. - Tricuspid  valve: Mild prolapse. No evidence of vegetation. - Pulmonic valve: No evidence of vegetation. - Pericardium, extracardiac: A small pericardial effusion was   identified.  Impressions:  - Hypokinesis of the inferolateral wall with overall preserved LV   function; calcified aortic valve with severe AS (mean gradient 56   mmHg); probable ruptured chord of anterior MV leaflet with severe   eccentric MR directed posterolaterally; severe LAE; no LAA   thrombus; moderate RVE; small pericardial effusion; moderate   atherosclerosis descending aorta.  STS Risk Calculator (isolated AVR): Procedure: Isolated AVR   Risk of Mortality: 3.220%  Renal Failure: 4.034%  Permanent Stroke: 3.122%  Prolonged Ventilation: 12.855%  DSW Infection: 0.070%  Reoperation: 5.522%  Morbidity or Mortality: 25.389%  Short Length of Stay:  18.961%  Long Length of Stay: 14.652%   ASSESSMENT AND PLAN: 84 year old gentleman with severe, stage D1, symptomatic aortic stenosis and mitral regurgitation.  The patient notes progressive symptoms of dyspnea, edema, and orthopnea consistent with acute on chronic diastolic heart failure, NYHA functional class IIIb.  I have personally reviewed the patient's echo and TEE images.  Overall LV systolic function is preserved.  There is clear evidence of severe aortic stenosis.  The aortic valve appears to be trileaflet and there is severe calcification and restriction of all 3 leaflets.  The mean transvalvular gradient is 55 mmHg by TEE.  The mitral valve annulus is calcified and there is severe posteriorly directed mitral regurgitation.  There appears to be a flail segment of the anterior leaflet of the mitral valve possibly secondary to a ruptured cord.  The most definitive treatment for the patient's severe aortic stenosis and mitral regurgitation would be conventional surgical AVR, MV repair, Maze, +/- CABG. At age 47 with hx of stroke with residual right sided weakness and gait instability, permanent atrial fibrillation, CAD, and chronic diastolic heart failure, I doubt he will be a candidate for conventional heart surgery under any circumstances. It seems reasonable to consider TAVR for treatment of his severe aortic stenosis. I would not expect the patient's mitral regurgitation to improve significantly after TAVR since it appears to be primary rather than secondary mitral regurgitation. However, I suspect treatment of his aortic stenosis would significantly improve his symptoms as he has very severe aortic stenosis. If he were to have persistent symptoms related to severe mitral regurgitation following TAVR, edge-to-edge mitral approximation could be considered if his valve is amenable. I'm concerned that he may not have a sufficient valve area to pursue this therapy.   I have discussed TAVR and the  anticipated evaluation at length with the patient and his wife today.  We reviewed necessary testing including diagnostic right and left heart catheterization, CT angiogram studies, and formal cardiac surgical consultation.  The patient understands the serious risks of TAVR include stroke, major vascular injury, myocardial infarction, need for permanent pacemaker, and death.  He understands these risks occur at low frequency and he would like to proceed with further evaluation.  I have also reviewed the risks, indications, and alternatives to cardiac catheterization, possible angioplasty, and stenting with the patient. Risks include but are not limited to bleeding, infection, vascular injury, stroke, myocardial infection, arrhythmia, kidney injury, radiation-related injury in the case of prolonged fluoroscopy use, emergency cardiac surgery, and death. The patient understands the risks of serious complication is 1-2 in 8416 with diagnostic cardiac cath and 1-2% or less with angioplasty/stenting. Following cardiac catheterization, I am going to refer him to multidisciplinary evaluation by cardiac surgery for further  evaluation of treatment options related to his valvular heart disease.   Will bridge him with lovenox for upcoming procedures considering his hx of stroke, permanent atrial fibrillation, and LA 'smoke' seen on TEE.  Current medicines are reviewed with the patient today.  The patient does not have concerns regarding medicines.  Labs/ tests ordered today include:  No orders of the defined types were placed in this encounter.  Signed, Sherren Mocha, MD  07/26/2017 9:44 AM    [facility] [address], [place], [place]  [address] Phone: [phone]; Fax: [phone]

## 2017-07-26 ENCOUNTER — Encounter: Payer: Self-pay | Admitting: Cardiovascular Disease

## 2017-07-26 ENCOUNTER — Ambulatory Visit (INDEPENDENT_AMBULATORY_CARE_PROVIDER_SITE_OTHER): Payer: Medicare HMO | Admitting: Pharmacist

## 2017-07-26 ENCOUNTER — Ambulatory Visit: Payer: Medicare HMO | Admitting: Cardiovascular Disease

## 2017-07-26 VITALS — BP 110/62 | HR 72 | Ht 70.5 in | Wt 165.0 lb

## 2017-07-26 DIAGNOSIS — I350 Nonrheumatic aortic (valve) stenosis: Secondary | ICD-10-CM | POA: Diagnosis not present

## 2017-07-26 DIAGNOSIS — Z5181 Encounter for therapeutic drug level monitoring: Secondary | ICD-10-CM

## 2017-07-26 DIAGNOSIS — I482 Chronic atrial fibrillation, unspecified: Secondary | ICD-10-CM

## 2017-07-26 LAB — POCT INR: INR: 2.4

## 2017-07-26 MED ORDER — ENOXAPARIN SODIUM 80 MG/0.8ML ~~LOC~~ SOLN: 80.0000 mg | Freq: Two times a day (BID) | SUBCUTANEOUS | 0 refills | Status: DC

## 2017-07-26 NOTE — Patient Instructions (Signed)
Medication Instructions:  Your physician recommends that you continue on your current medications as directed. Please refer to the Current Medication list given to you today.  Labwork: No new orders.   Testing/Procedures: Your physician has requested that you have a cardiac catheterization. Cardiac catheterization is used to diagnose and/or treat various heart conditions. Doctors may recommend this procedure for a number of different reasons. The most common reason is to evaluate chest pain. Chest pain can be a symptom of coronary artery disease (CAD), and cardiac catheterization can show whether plaque is narrowing or blocking your heart's arteries. This procedure is also used to evaluate the valves, as well as measure the blood flow and oxygen levels in different parts of your heart. For further information please visit [facility]. Please follow instruction sheet, as given.    [place] OFFICE [address], [place] [address] [place] [address] Dept: [phone] Loc: [phone]  Armour Villanueva  07/26/2017  You are scheduled for a Cardiac Catheterization on Wednesday, February 13 with Dr. Sherren Mocha.  1. Please arrive at the [facility] (Main Entrance A) at [facility] [place], [place] [address] at 9:00 AM (two hours before your procedure to ensure your preparation). Free valet parking service is available.   Special note: Every effort is made to have your procedure done on time. Please understand that emergencies sometimes delay scheduled procedures.  2. Diet: Do not eat or drink anything after midnight prior to your procedure except sips of water to take medications.  3. Labs: Your labs will be performed at the hospital after you arrive for your procedure.  4. Medication instructions in preparation for your procedure:  Please follow instructions from the  Anticoagulation Clinic in regards to stopping warfarin and starting lovenox bridge.    Do NOT take Furosemide (Lasix) the morning of procedure.   Please take one Aspirin 81mg  before coming to the hospital the morning of your procedure.   On the morning of your procedure, take your Aspirin and any morning medicines NOT listed above.  You may use sips of water.  5. Plan for one night stay--bring personal belongings.  6. Bring a current list of your medications and current insurance cards.  7. You MUST have a responsible person to drive you home.  8. Someone MUST be with you the first 24 hours after you arrive home or your discharge will be delayed.  9. Please wear clothes that are easy to get on and off and wear slip-on shoes.  Thank you for allowing [place] to care for you!   -- [facility]  Follow-Up: We will arrange additional TAVR evaluation after cardiac catheterization.  Any Other Special Instructions Will Be Listed Below (If Applicable).     If you need a refill on your cardiac medications before your next appointment, please call your pharmacy.

## 2017-07-26 NOTE — Patient Instructions (Addendum)
2/8: Last dose of Coumadin.  2/9: No Coumadin or Lovenox.  2/10: Inject Lovenox 80mg  in the fatty abdominal tissue at least 2 inches from the belly button twice a day about 12 hours apart, 8am and 8pm rotate sites. No Coumadin.  2/11: Inject Lovenox in the fatty tissue every 12 hours, 8am and 8pm. No Coumadin.  2/12: Inject Lovenox in the fatty tissue in the morning at 8 am (No PM dose). No Coumadin.  2/13: Procedure Day - No Lovenox - Resume Coumadin in the evening or as directed by doctor (take an extra half tablet with usual dose for 2 days then resume normal dose).  2/14: Resume Lovenox inject in the fatty tissue every 12 hours and take Coumadin.  2/15: Inject Lovenox in the fatty tissue every 12 hours and take Coumadin.  2/16: Inject Lovenox in the fatty tissue every 12 hours and take Coumadin.  2/17: Inject Lovenox in the fatty tissue every 12 hours and take Coumadin.  2/18: Inject Lovenox in the fatty tissue every 12 hours and take Coumadin.  2/19: Coumadin appt to check INR.

## 2017-07-27 ENCOUNTER — Telehealth: Payer: Self-pay | Admitting: Cardiology

## 2017-07-27 ENCOUNTER — Telehealth: Payer: Self-pay

## 2017-07-27 ENCOUNTER — Telehealth: Payer: Self-pay | Admitting: Physician Assistant

## 2017-07-27 NOTE — Telephone Encounter (Signed)
Pt's spouse called stating that pt had dental surgery yesterday and is having excessive bleeding from the site.  Spouse states that dentist advised them to call his PCP for an RX for Vitamin K d/t to coumadin use.  Advised pt's spouse that we do not have Vitamin K injections in our office and pt should be seen at ED.  Spouse was requesting we "send in a prescription" for the Vitamin K.  Advised spouse that Vitamin K is an injection and had potential for serious complications d/t coumadin use.  Pt's spouse expressed understanding, but stated that she was going to call the dentist back because she does not want to have to take pt to ED.  Advised spouse that [place] would likely be able to see the patient in a more timely manner than [place] d/t census in the ED.  Charyl Bigger, CMA

## 2017-07-27 NOTE — Telephone Encounter (Signed)
  [facility]   MULTIDISCIPLINARY HEART VALVE TEAM  I talked with Ms. Brisby. I have okayed the use of silver nitrate powder or thrombin paste, but this would need to be Rx'd by the dentist. I discussed case with Dr. Burt Knack and we decided that they will continue to hold coumadin and hold off on Lovenox bridge until Monday. I do not think he needs Vitamin K. He is not profusely bleeding. We went over ER precautions in the case that is worsens. For now I would recommend holding all anticoagulants and topical therapies.  I will check back in with them on Monday.    Angelena Form PA-C  [facility]

## 2017-07-27 NOTE — Telephone Encounter (Signed)
  [facility] MULTIDISCIPLINARY HEART VALVE TEAM    I talked with Mrs. Mangine. Mr. Kyung Bacca is having bleeding after tooth extraction yesterday. He saw Fuller Canada in the coumadin clinic yesterday and plan was to stop coumadin starting tomorrow and then start a Lovenox bridge on 2/10. I discussed case with Dr. Burt Knack and we decided that they will we will have him hold coumadin starting now and hold off on Lovenox bridge until Monday. I do not think he needs Vitamin K. He is not profusely bleeding. We went over ER precautions in the case that is worsens. I have okayed the use of silver nitrate powder or thrombin paste, but this would need to be Rx'd by the dentist. For now I would recommend holding all anticoagulants and trying topical therapies.  I will check back in with them on Monday.    Angelena Form PA-C  [facility]

## 2017-07-27 NOTE — Telephone Encounter (Signed)
Spoke with wife.  She reports that oral surgeon told them to try a tea bag about 30 minutes ago.  Reports that it is working well so far.  Wellsite geologist also suggested a shot of vitamin K should the tea bag not work.  I stressed to Mrs. Pendry that the vitamin K was not recommended.    They will continue with tea bag.  If bleeding resolves this am, he is to continue with schedule set by Fuller Canada in his anti-coag note from yesterday.  If he continues to bleed all day, he should hold the warfarin dose and contact dentist again.    If all is well by the end of the day, he will need to continue with the anticoag/bridge schedule and start the enoxaparin injections on Sunday.

## 2017-07-27 NOTE — Telephone Encounter (Signed)
Routed to clinical pharmacy staff to advise patient on bleeding issue - on warfarin

## 2017-07-27 NOTE — Telephone Encounter (Signed)
Now see note from Angelena Form PA.    Spoke again with Wayne [name] and patient will hold warfarin and not start injections of enoxaparin until Monday.  She voiced understanding

## 2017-07-27 NOTE — Telephone Encounter (Signed)
New Message:   Pt had a dental procedure yesterday and het have been bleeding since yesterday evening.His dentist told her to call and see if he could use a Topical Thrombin paste or Silver Nitrate Powder? Please calll asap,she needs some help.

## 2017-07-30 ENCOUNTER — Telehealth: Payer: Self-pay | Admitting: *Deleted

## 2017-07-30 ENCOUNTER — Other Ambulatory Visit: Payer: Self-pay | Admitting: Physician Assistant

## 2017-07-30 NOTE — Telephone Encounter (Signed)
I spoke with pt's wife (DPR). Pt contacted pre-catheterization scheduled at [facility] for: Wednesday February 13,2019 11:30 AM Verified arrival time and place: [place] at: 9 AM Verified no diabetes medications. Verified allergies in Epic.  HOLD medications: Pt is following CVRR instructions regarding warfarin/lovenox bridging pre cath. Furosemide am of cath.  Except hold medications verified AM meds can be  taken pre-cath with sip of water including: ASA 81 mg am of cath   Confirmed patient has responsible person to drive home post procedure and observe patient for 24 hours: yes

## 2017-08-01 ENCOUNTER — Ambulatory Visit (HOSPITAL_COMMUNITY)
Admission: RE | Admit: 2017-08-01 | Discharge: 2017-08-01 | Disposition: A | Discharge status: Home / Self Care | Payer: Medicare HMO | Source: Ambulatory Visit | Attending: Cardiovascular Disease | Admitting: Cardiovascular Disease

## 2017-08-01 ENCOUNTER — Ambulatory Visit (HOSPITAL_COMMUNITY)
Admission: RE | Disposition: A | Discharge status: Home / Self Care | Payer: Self-pay | Source: Ambulatory Visit | Attending: Cardiovascular Disease

## 2017-08-01 DIAGNOSIS — I2584 Coronary atherosclerosis due to calcified coronary lesion: Secondary | ICD-10-CM | POA: Insufficient documentation

## 2017-08-01 DIAGNOSIS — Z880 Allergy status to penicillin: Secondary | ICD-10-CM | POA: Insufficient documentation

## 2017-08-01 DIAGNOSIS — I5032 Chronic diastolic (congestive) heart failure: Secondary | ICD-10-CM | POA: Insufficient documentation

## 2017-08-01 DIAGNOSIS — Z7901 Long term (current) use of anticoagulants: Secondary | ICD-10-CM | POA: Diagnosis not present

## 2017-08-01 DIAGNOSIS — Z885 Allergy status to narcotic agent status: Secondary | ICD-10-CM | POA: Insufficient documentation

## 2017-08-01 DIAGNOSIS — N400 Enlarged prostate without lower urinary tract symptoms: Secondary | ICD-10-CM | POA: Diagnosis not present

## 2017-08-01 DIAGNOSIS — Z9861 Coronary angioplasty status: Secondary | ICD-10-CM | POA: Diagnosis not present

## 2017-08-01 DIAGNOSIS — I2510 Atherosclerotic heart disease of native coronary artery without angina pectoris: Secondary | ICD-10-CM | POA: Insufficient documentation

## 2017-08-01 DIAGNOSIS — I252 Old myocardial infarction: Secondary | ICD-10-CM | POA: Diagnosis not present

## 2017-08-01 DIAGNOSIS — I080 Rheumatic disorders of both mitral and aortic valves: Secondary | ICD-10-CM | POA: Diagnosis not present

## 2017-08-01 DIAGNOSIS — I482 Chronic atrial fibrillation: Secondary | ICD-10-CM | POA: Diagnosis not present

## 2017-08-01 DIAGNOSIS — I110 Hypertensive heart disease with heart failure: Secondary | ICD-10-CM | POA: Insufficient documentation

## 2017-08-01 DIAGNOSIS — I350 Nonrheumatic aortic (valve) stenosis: Secondary | ICD-10-CM | POA: Diagnosis not present

## 2017-08-01 DIAGNOSIS — Z8673 Personal history of transient ischemic attack (TIA), and cerebral infarction without residual deficits: Secondary | ICD-10-CM | POA: Diagnosis not present

## 2017-08-01 DIAGNOSIS — M1990 Unspecified osteoarthritis, unspecified site: Secondary | ICD-10-CM | POA: Diagnosis not present

## 2017-08-01 DIAGNOSIS — I272 Pulmonary hypertension, unspecified: Secondary | ICD-10-CM | POA: Diagnosis not present

## 2017-08-01 HISTORY — PX: RIGHT/LEFT HEART CATH AND CORONARY ANGIOGRAPHY: CATH118266

## 2017-08-01 LAB — BASIC METABOLIC PANEL
Anion gap: 10 (ref 5–15)
BUN: 16 mg/dL (ref 6–20)
CHLORIDE: 101 mmol/L (ref 101–111)
CO2: 24 mmol/L (ref 22–32)
Calcium: 8.8 mg/dL — ABNORMAL LOW (ref 8.9–10.3)
Creatinine, Ser: 0.81 mg/dL (ref 0.61–1.24)
GFR calc non Af Amer: >60 mL/min (ref >60)
Glucose, Bld: 94 mg/dL (ref 65–99)
POTASSIUM: 3.8 mmol/L (ref 3.5–5.1)
SODIUM: 135 mmol/L (ref 135–145)

## 2017-08-01 LAB — CBC
HEMATOCRIT: 35.9 % — AB (ref 39.0–52.0)
HEMOGLOBIN: 11.8 g/dL — AB (ref 13.0–17.0)
MCH: 29.1 pg (ref 26.0–34.0)
MCHC: 32.9 g/dL (ref 30.0–36.0)
MCV: 88.4 fL (ref 78.0–100.0)
Platelets: 129 10*3/uL — ABNORMAL LOW (ref 150–400)
RBC: 4.06 MIL/uL — AB (ref 4.22–5.81)
RDW: 17.5 % — ABNORMAL HIGH (ref 11.5–15.5)
WBC: 4.8 10*3/uL (ref 4.0–10.5)

## 2017-08-01 LAB — POCT I-STAT 3, VENOUS BLOOD GAS (G3P V)
BICARBONATE: 25.8 mmol/L (ref 20.0–28.0)
O2 Saturation: 57 %
PH VEN: 7.367 (ref 7.250–7.430)
TCO2: 27 mmol/L (ref 22–32)
pCO2, Ven: 44.9 mmHg (ref 44.0–60.0)
pO2, Ven: 31 mmHg — CL (ref 32.0–45.0)

## 2017-08-01 LAB — PROTIME-INR
INR: 1.13
PROTHROMBIN TIME: 14.4 s (ref 11.4–15.2)

## 2017-08-01 LAB — POCT I-STAT 3, ART BLOOD GAS (G3+)
Bicarbonate: 25.5 mmol/L (ref 20.0–28.0)
O2 SAT: 90 %
PCO2 ART: 44.9 mmHg (ref 32.0–48.0)
TCO2: 27 mmol/L (ref 22–32)
pH, Arterial: 7.363 (ref 7.350–7.450)
pO2, Arterial: 61 mmHg — ABNORMAL LOW (ref 83.0–108.0)

## 2017-08-01 SURGERY — RIGHT/LEFT HEART CATH AND CORONARY ANGIOGRAPHY
Anesthesia: LOCAL

## 2017-08-01 MED ORDER — FENTANYL CITRATE (PF) 100 MCG/2ML IJ SOLN
INTRAMUSCULAR | Status: DC | PRN
  Administered 2017-08-01: 25 ug via INTRAVENOUS

## 2017-08-01 MED ORDER — MIDAZOLAM HCL 2 MG/2ML IJ SOLN
INTRAMUSCULAR | Status: DC | PRN
  Administered 2017-08-01: 1 mg via INTRAVENOUS

## 2017-08-01 MED ORDER — HEPARIN SODIUM (PORCINE) 1000 UNIT/ML IJ SOLN
INTRAMUSCULAR | Status: DC | PRN
  Administered 2017-08-01: 3500 [IU] via INTRAVENOUS

## 2017-08-01 MED ORDER — SODIUM CHLORIDE 0.9 % WEIGHT BASED INFUSION: 1.0000 mL/kg/h | INTRAVENOUS | Status: DC

## 2017-08-01 MED ORDER — HEPARIN (PORCINE) IN NACL 2-0.9 UNIT/ML-% IJ SOLN
INTRAMUSCULAR | Status: AC
  Filled 2017-08-01: qty 1000

## 2017-08-01 MED ORDER — SODIUM CHLORIDE 0.9% FLUSH: 3.0000 mL | INTRAVENOUS | Status: DC | PRN

## 2017-08-01 MED ORDER — LIDOCAINE HCL (PF) 1 % IJ SOLN
INTRAMUSCULAR | Status: DC | PRN
  Administered 2017-08-01 (×2): 2 mL

## 2017-08-01 MED ORDER — SODIUM CHLORIDE 0.9% FLUSH: 3.0000 mL | Freq: Two times a day (BID) | INTRAVENOUS | Status: DC

## 2017-08-01 MED ORDER — MIDAZOLAM HCL 2 MG/2ML IJ SOLN
INTRAMUSCULAR | Status: AC
  Filled 2017-08-01: qty 2

## 2017-08-01 MED ORDER — VERAPAMIL HCL 2.5 MG/ML IV SOLN
INTRAVENOUS | Status: AC
  Filled 2017-08-01: qty 2

## 2017-08-01 MED ORDER — SODIUM CHLORIDE 0.9 % IV SOLN: 250.0000 mL | INTRAVENOUS | Status: DC | PRN

## 2017-08-01 MED ORDER — FENTANYL CITRATE (PF) 100 MCG/2ML IJ SOLN
INTRAMUSCULAR | Status: AC
  Filled 2017-08-01: qty 2

## 2017-08-01 MED ORDER — ASPIRIN 81 MG PO CHEW: 81.0000 mg | CHEWABLE_TABLET | ORAL | Status: DC

## 2017-08-01 MED ORDER — IOPAMIDOL (ISOVUE-370) INJECTION 76%
INTRAVENOUS | Status: AC
  Filled 2017-08-01: qty 50

## 2017-08-01 MED ORDER — LIDOCAINE HCL 1 % IJ SOLN
INTRAMUSCULAR | Status: AC
  Filled 2017-08-01: qty 20

## 2017-08-01 MED ORDER — IOPAMIDOL (ISOVUE-370) INJECTION 76%
INTRAVENOUS | Status: DC | PRN
  Administered 2017-08-01: 23.3 mL via INTRA_ARTERIAL

## 2017-08-01 MED ORDER — HEPARIN (PORCINE) IN NACL 2-0.9 UNIT/ML-% IJ SOLN
INTRAMUSCULAR | Status: AC | PRN
  Administered 2017-08-01 (×2): 500 mL

## 2017-08-01 MED ORDER — HEPARIN SODIUM (PORCINE) 1000 UNIT/ML IJ SOLN
INTRAMUSCULAR | Status: AC
  Filled 2017-08-01: qty 1

## 2017-08-01 MED ORDER — SODIUM CHLORIDE 0.9 % WEIGHT BASED INFUSION
3.0000 mL/kg/h | INTRAVENOUS | Status: AC
  Administered 2017-08-01: 3 mL/kg/h via INTRAVENOUS

## 2017-08-01 MED ORDER — VERAPAMIL HCL 2.5 MG/ML IV SOLN
INTRAVENOUS | Status: DC | PRN
  Administered 2017-08-01: 12:00:00 via INTRA_ARTERIAL

## 2017-08-01 SURGICAL SUPPLY — 14 items
CATH BALLN WEDGE 5F 110CM (CATHETERS) ×1 IMPLANT
CATH IMPULSE 5F ANG/FL3.5 (CATHETERS) ×1 IMPLANT
COVER PRB 48X5XTLSCP FOLD TPE (BAG) IMPLANT
COVER PROBE 5X48 (BAG)
DEVICE RAD COMP TR BAND LRG (VASCULAR PRODUCTS) ×1 IMPLANT
GUIDEWIRE INQWIRE 1.5J.035X260 (WIRE) IMPLANT
INQWIRE 1.5J .035X260CM (WIRE) ×2
KIT HEART LEFT (KITS) ×2 IMPLANT
KIT PREMIUM HAND CONTROLLER (KITS) ×1 IMPLANT
KIT SINGLE USE MANIFOLD (KITS) ×1 IMPLANT
PACK CARDIAC CATHETERIZATION (CUSTOM PROCEDURE TRAY) ×2 IMPLANT
SHEATH GLIDE SLENDER 4/5FR (SHEATH) ×1 IMPLANT
TRANSDUCER W/STOPCOCK (MISCELLANEOUS) ×2 IMPLANT
TUBING CIL FLEX 10 FLL-RA (TUBING) ×2 IMPLANT

## 2017-08-01 NOTE — Progress Notes (Signed)
Dr. Oneida Alar in to see pt, awaiting ok for pt to go home.

## 2017-08-01 NOTE — Discharge Instructions (Signed)

## 2017-08-01 NOTE — Interval H&P Note (Signed)
History and Physical Interval Note:  08/01/2017 11:48 AM  Wayne [name]  has presented today for surgery, with the diagnosis of as  The various methods of treatment have been discussed with the patient and family. After consideration of risks, benefits and other options for treatment, the patient has consented to  Procedure(s): RIGHT/LEFT HEART CATH AND CORONARY ANGIOGRAPHY (N/A) as a surgical intervention .  The patient's history has been reviewed, patient examined, no change in status, stable for surgery.  I have reviewed the patient's chart and labs.  Questions were answered to the patient's satisfaction.     Sherren Mocha

## 2017-08-02 ENCOUNTER — Encounter (HOSPITAL_COMMUNITY): Payer: Self-pay | Admitting: Cardiovascular Disease

## 2017-08-02 MED FILL — Heparin Sodium (Porcine) 2 Unit/ML in Sodium Chloride 0.9%: INTRAMUSCULAR | Qty: 1000 | Status: AC

## 2017-08-02 MED FILL — Lidocaine HCl Local Inj 1%: INTRAMUSCULAR | Qty: 20 | Status: AC

## 2017-08-03 DIAGNOSIS — I350 Nonrheumatic aortic (valve) stenosis: Secondary | ICD-10-CM | POA: Diagnosis not present

## 2017-08-06 ENCOUNTER — Ambulatory Visit (HOSPITAL_COMMUNITY)
Admission: RE | Admit: 2017-08-06 | Discharge: 2017-08-06 | Disposition: A | Discharge status: Home / Self Care | Payer: Medicare HMO | Source: Ambulatory Visit | Attending: Cardiovascular Disease | Admitting: Cardiovascular Disease

## 2017-08-06 ENCOUNTER — Ambulatory Visit (HOSPITAL_BASED_OUTPATIENT_CLINIC_OR_DEPARTMENT_OTHER)
Admission: RE | Admit: 2017-08-06 | Discharge: 2017-08-06 | Disposition: A | Discharge status: Home / Self Care | Payer: Medicare HMO | Source: Ambulatory Visit | Attending: Cardiovascular Disease | Admitting: Cardiovascular Disease

## 2017-08-06 ENCOUNTER — Ambulatory Visit (HOSPITAL_COMMUNITY): Admission: RE | Admit: 2017-08-06 | Payer: Medicare HMO | Source: Ambulatory Visit

## 2017-08-06 DIAGNOSIS — I2510 Atherosclerotic heart disease of native coronary artery without angina pectoris: Secondary | ICD-10-CM | POA: Diagnosis not present

## 2017-08-06 DIAGNOSIS — N323 Diverticulum of bladder: Secondary | ICD-10-CM | POA: Insufficient documentation

## 2017-08-06 DIAGNOSIS — I700 Atherosclerosis of aorta: Secondary | ICD-10-CM | POA: Diagnosis not present

## 2017-08-06 DIAGNOSIS — I350 Nonrheumatic aortic (valve) stenosis: Secondary | ICD-10-CM | POA: Insufficient documentation

## 2017-08-06 DIAGNOSIS — J449 Chronic obstructive pulmonary disease, unspecified: Secondary | ICD-10-CM | POA: Insufficient documentation

## 2017-08-06 DIAGNOSIS — J90 Pleural effusion, not elsewhere classified: Secondary | ICD-10-CM | POA: Insufficient documentation

## 2017-08-06 DIAGNOSIS — I252 Old myocardial infarction: Secondary | ICD-10-CM | POA: Diagnosis not present

## 2017-08-06 LAB — PULMONARY FUNCTION TEST
DL/VA % pred: 59 %
DL/VA: 2.76 ml/min/mmHg/L
DLCO UNC % PRED: 35 %
DLCO unc: 11.73 ml/min/mmHg
FEF 25-75 PRE: 3.3 L/s
FEF 25-75 Post: 2.92 L/sec
FEF2575-%CHANGE-POST: -11 %
FEF2575-%PRED-POST: 169 %
FEF2575-%Pred-Pre: 192 %
FEV1-%CHANGE-POST: -4 %
FEV1-%Pred-Post: 68 %
FEV1-%Pred-Pre: 71 %
FEV1-POST: 1.84 L
FEV1-PRE: 1.92 L
FEV1FVC-%Change-Post: 0 %
FEV1FVC-%PRED-PRE: 141 %
FEV6-%Change-Post: -4 %
FEV6-%Pred-Post: 51 %
FEV6-%Pred-Pre: 54 %
FEV6-PRE: 1.94 L
FEV6-Post: 1.85 L
FEV6FVC-%Pred-Post: 107 %
FEV6FVC-%Pred-Pre: 107 %
FVC-%Change-Post: -4 %
FVC-%PRED-POST: 48 %
FVC-%PRED-PRE: 50 %
FVC-POST: 1.85 L
FVC-PRE: 1.94 L
POST FEV1/FVC RATIO: 99 %
PRE FEV6/FVC RATIO: 100 %
Post FEV6/FVC ratio: 100 %
Pre FEV1/FVC ratio: 99 %
RV % PRED: 140 %
RV: 3.95 L
TLC % pred: 80 %
TLC: 5.77 L

## 2017-08-06 MED ORDER — METOPROLOL TARTRATE 5 MG/5ML IV SOLN
5.0000 mg | Freq: Once | INTRAVENOUS | Status: DC
  Filled 2017-08-06: qty 5

## 2017-08-06 MED ORDER — ALBUTEROL SULFATE (2.5 MG/3ML) 0.083% IN NEBU
2.5000 mg | INHALATION_SOLUTION | Freq: Once | RESPIRATORY_TRACT | Status: AC
  Administered 2017-08-06: 2.5 mg via RESPIRATORY_TRACT

## 2017-08-06 MED ORDER — IOPAMIDOL (ISOVUE-370) INJECTION 76%
INTRAVENOUS | Status: AC
  Administered 2017-08-06: 100 mL
  Filled 2017-08-06: qty 100

## 2017-08-06 MED ORDER — METOPROLOL TARTRATE 5 MG/5ML IV SOLN
INTRAVENOUS | Status: AC
  Filled 2017-08-06: qty 5

## 2017-08-06 NOTE — Progress Notes (Signed)
VASCULAR LAB PRELIMINARY  PRELIMINARY  PRELIMINARY  PRELIMINARY  Carotid duplex completed.    Preliminary report:  1-39% ICA plaquing. Vertebral artery flow is antegrade.   Lakendra Helling, RVT 08/06/2017, 9:10 AM

## 2017-08-07 ENCOUNTER — Ambulatory Visit (INDEPENDENT_AMBULATORY_CARE_PROVIDER_SITE_OTHER): Payer: Medicare HMO | Admitting: Pharmacist Clinician (PhC)/ Clinical Pharmacy Specialist

## 2017-08-07 DIAGNOSIS — Z7901 Long term (current) use of anticoagulants: Secondary | ICD-10-CM

## 2017-08-07 DIAGNOSIS — Z5181 Encounter for therapeutic drug level monitoring: Secondary | ICD-10-CM | POA: Diagnosis not present

## 2017-08-07 DIAGNOSIS — I482 Chronic atrial fibrillation, unspecified: Secondary | ICD-10-CM

## 2017-08-07 NOTE — Patient Instructions (Signed)
Description   Restart warfarin tonight at normal dose,  2 tablets daily except 1 tablet on Tuesdays and Saturdays.  If the bleeding on your gums continues this evening, please hold warfarin dose.  Repeat INR in 2 weeks

## 2017-08-15 ENCOUNTER — Ambulatory Visit (HOSPITAL_COMMUNITY): Payer: Medicare HMO

## 2017-08-17 ENCOUNTER — Institutional Professional Consult (permissible substitution): Payer: Medicare HMO | Admitting: Thoracic Surgery (Cardiothoracic Vascular Surgery)

## 2017-08-17 ENCOUNTER — Encounter: Payer: Self-pay | Admitting: Thoracic Surgery (Cardiothoracic Vascular Surgery)

## 2017-08-17 ENCOUNTER — Other Ambulatory Visit: Payer: Self-pay

## 2017-08-17 VITALS — BP 112/73 | HR 74 | Resp 18 | Ht 70.5 in | Wt 171.2 lb

## 2017-08-17 DIAGNOSIS — I350 Nonrheumatic aortic (valve) stenosis: Secondary | ICD-10-CM

## 2017-08-17 DIAGNOSIS — I340 Nonrheumatic mitral (valve) insufficiency: Secondary | ICD-10-CM | POA: Diagnosis not present

## 2017-08-17 NOTE — Progress Notes (Signed)
[facility]  MULTIDISCIPLINARY HEART VALVE CLINIC  CARDIOTHORACIC SURGERY CONSULTATION REPORT  Referring Provider is Crenshaw, Denice Bors, MD PCP is [name], DO  Chief Complaint  Patient presents with  . Aortic Stenosis    First TAVR evaluation    HPI:  Patient is an 84 year old male with complex past medical history notable for history of aortic stenosis, mitral regurgitation with mitral stenosis, atrial fibrillation on long-term warfarin anticoagulation, coronary artery disease, previous embolic stroke, hypertension, benign prostatic hypertrophy with bladder outlet obstruction, and severe degenerative arthritis with chronic pain in his lower back and both legs who has been referred for surgical consultation to discuss treatment options for management of severe symptomatic aortic stenosis and mitral regurgitation.  Patient has a long cardiac history dating back many years with hypertension and permanent atrial fibrillation on long-term warfarin anticoagulation.  For many years he was followed by Dr. Mare Ferrari, and more recently he has been followed by Dr. Stanford Breed.  He presented with an acute non-ST segment elevation myocardial infarction in 2007 and was found to have single-vessel coronary artery disease.  He was treated with PCI and stenting of the left circumflex coronary artery.  He experienced an embolic stroke at that time and has been chronically anticoagulated on warfarin ever since.  Echocardiograms have documented the presence of normal left ventricular systolic function with aortic stenosis, mitral stenosis, and mitral regurgitation.  In December 2018 the patient was hospitalized with urinary retention secondary to bladder outlet obstruction.  At the time he had acute exacerbation of chronic diastolic congestive heart failure with resting shortness of breath and severe lower extremity edema.  Transthoracic echocardiogram performed at that time revealed normal left  ventricular systolic function but severe aortic stenosis and at least moderate but probably severe mitral regurgitation with what appeared to be a flail segment of the anterior leaflet of the mitral valve.  Transesophageal echocardiogram performed July 20, 2017 by Dr. Stanford Breed revealed mild left ventricular systolic dysfunction with ejection fraction estimated 55-60% and some hypokinesis of the inferolateral wall.  There was severe aortic stenosis with mean transvalvular gradient estimated 56 mmHg.  There was severe mitral regurgitation with what appeared to be ruptured chordae tendon E involving the anterior leaflet of mitral valve.  There was severe left atrial enlargement.  There was no left atrial thrombus.  There is moderate right ventricular chamber enlargement.  The patient was referred to the multidisciplinary heart valve clinic and evaluated previously by Dr. Burt Knack.  He underwent left and right heart catheterization on August 01, 2017.  This revealed calcified coronary arteries but only mild nonobstructive coronary artery disease.  There was moderate pulmonary hypertension with mean pulmonary artery pressure measured 37 mmHg.  There were large V waves on wedge tracing consistent with severe mitral regurgitation.  The patient subsequently underwent CT angiography and has been referred for surgical consultation.  The patient is married and lives locally in [place] with his wife.  He has 3 adult children, 1 of whom accompanies the patient and his wife for office consultation visit today.  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  His physical mobility has been somewhat limited ever since his stroke in 2007.  He states that over the past year and a half he has declined considerably.  He has severe chronic pain in his lower back and both legs which seems to get progressively worse as the days go on.  He ambulates using a cane or a  walker and really cannot  get around very much.  He gets short of breath with very low level activity.  He has instability of gait and reports easy bruising.  Complains of orthopnea but denies any episodes of PND.  He has had lower extremity edema.  He denies any chest pain or chest tightness either with activity or at rest.  He does not take diuretic medications regularly but they have been prescribed.  Past Medical History:  Diagnosis Date  . Aortic stenosis   . Arthritis   . Atrial fibrillation (HCC)    Chronic  . Atrial fibrillation, chronic ([place])   . BPH (benign prostatic hyperplasia)   . CAD (coronary artery disease)   . Chronic anticoagulation   . History of chicken pox   . HTN (hypertension)   . Mitral stenosis   . Old MI (myocardial infarction) 2007   s/p PCI to distal OM (no stent)  . Severe mitral regurgitation   . Stroke, embolic [facility])     Past Surgical History:  Procedure Laterality Date  . CORONARY ANGIOPLASTY  2007   Distal OM  . PROSTATE BIOPSY    . RIGHT/LEFT HEART CATH AND CORONARY ANGIOGRAPHY N/A 08/01/2017   Procedure: RIGHT/LEFT HEART CATH AND CORONARY ANGIOGRAPHY;  Surgeon: Sherren Mocha, MD;  Location: [place] [facility];  Service: Cardiovascular;  Laterality: N/A;  . TEE WITHOUT CARDIOVERSION N/A 07/20/2017   Procedure: TRANSESOPHAGEAL ECHOCARDIOGRAM (TEE);  Surgeon: Lelon Perla, MD;  Location: [facility] ENDOSCOPY;  Service: Cardiovascular;  Laterality: N/A;    Family History  Problem Relation Age of Onset  . Heart disease Mother   . Heart attack Mother   . Heart disease Father   . Heart attack Father   . Heart attack Brother   . Heart attack Sister   . Stroke Brother     Social History   Socioeconomic History  . Marital status: Married    Spouse name: Not on file  . Number of children: 3  . Years of education: 48  . Highest education level: Not on file  Social Needs  . Financial resource strain: Not on file  . Food insecurity - worry: Not on file  . Food insecurity  - inability: Not on file  . Transportation needs - medical: Not on file  . Transportation needs - non-medical: Not on file  Occupational History  . Not on file  Tobacco Use  . Smoking status: Never Smoker  . Smokeless tobacco: Never Used  Substance and Sexual Activity  . Alcohol use: No  . Drug use: No  . Sexual activity: Not on file  Other Topics Concern  . Not on file  Social History Narrative   Fun: Garden, work in the yard, anything physical and walk daily    Current Outpatient Medications  Medication Sig Dispense Refill  . acetaminophen (TYLENOL) 500 MG tablet Take 1,000 mg by mouth every 6 (six) hours as needed for moderate pain or headache.    . alfuzosin (UROXATRAL) 10 MG 24 hr tablet Take 10 mg by mouth daily with breakfast.    . ARTIFICIAL TEAR OP Place 1 drop into both eyes daily as needed (dry eyes).    . calcium carbonate (TUMS - DOSED IN MG ELEMENTAL CALCIUM) 500 MG chewable tablet Chew 1-2 tablets by mouth daily as needed for indigestion or heartburn.    . cholecalciferol 2000 units TABS Take 1 tablet (2,000 Units total) by mouth daily. 30 tablet 0  . digoxin (LANOXIN) 0.125  MG tablet TAKE ONE TABLET BY MOUTH ONCE DAILY (Patient taking differently: TAKE ONE TABLET (183mcg) BY MOUTH ONCE DAILY) 90 tablet 3  . finasteride (PROSCAR) 5 MG tablet Take 1 tablet (5 mg total) by mouth daily.    . furosemide (LASIX) 20 MG tablet Take 2 tablets (40 mg total) by mouth daily. 90 tablet 3  . Melatonin 1 MG TABS Take 1 mg by mouth at bedtime.     . Menthol, Topical Analgesic, (BIOFREEZE EX) Apply 1 application topically daily as needed (muscle pain). alterrnates between [employer] and Blu Emu    . Menthol, Topical Analgesic, (BLUE-EMU MAXIMUM STRENGTH EX) Apply 1 application topically daily as needed (alterrnates between Bio Freeze and Blu Emu).    . methocarbamol (ROBAXIN) 500 MG tablet Take 500 mg by mouth daily as needed (cramps).     . metoprolol tartrate (LOPRESSOR) 25 MG tablet  TAKE ONE TABLET (25mg ) BY MOUTH TWICE DAILY. (Patient taking differently: Take 25 mg by mouth 2 (two) times daily. ) 135 tablet 3  . polyethylene glycol (MIRALAX / GLYCOLAX) packet Take 17 g by mouth daily as needed for mild constipation.     . potassium chloride (K-DUR) 10 MEQ tablet Take 1 tablet (10 mEq total) by mouth daily as needed. Take potasium if you take lasix. 30 tablet 2  . sodium chloride (OCEAN) 0.65 % SOLN nasal spray Place 1 spray into both nostrils daily as needed for congestion.    . triamcinolone cream (KENALOG) 0.1 % Apply 1 application topically daily.     Marland Kitchen warfarin (COUMADIN) 2.5 MG tablet TAKE 1 TO 2 TABLETS BY MOUTH ONCE DAILY AS DIRECTED BY  COUMADIN  CLINIC (Patient taking differently: Take 2.5-5 mg by mouth See admin instructions. TAKES (5 MG) 2 TABLETS DAILY EXCEPT ON TUESDAYS AND SUNDAYS AND TAKES 1 TABLET (2.5 MG) BY MOUTH ONCE DAILY AS DIRECTED BY  COUMADIN  CLINIC) 180 tablet 0   No current facility-administered medications for this visit.     Allergies  Allergen Reactions  . Prednisone Other (See Comments)    Wheezing  . Procaine Hcl Other (See Comments)    Passed out after being given this at dental appointment  . Levofloxacin Rash  . Oxycodone Other (See Comments)    constipation  . Penicillins Rash    Has patient had a PCN reaction causing immediate rash, facial/tongue/throat swelling, SOB or lightheadedness with hypotension: Yes Has patient had a PCN reaction causing severe rash involving mucus membranes or skin necrosis: No Has patient had a PCN reaction that required hospitalization No Has patient had a PCN reaction occurring within the last 10 years: No If all of the above answers are "NO", then may proceed with Cephalosporin use.      Review of Systems:   General:  fair appetite, decreased energy, no weight gain, no weight loss, no fever  Cardiac:  no chest pain with exertion, no chest pain at rest, + SOB with exertion, occasional resting SOB,  no PND, + orthopnea, no palpitations, + arrhythmia, + atrial fibrillation, + LE edema, + dizzy spells, no syncope  Respiratory:  + shortness of breath, no home oxygen, + productive cough, no dry cough, no bronchitis, no wheezing, no hemoptysis, no asthma, no pain with inspiration or cough, no sleep apnea, no CPAP at night  GI:   no difficulty swallowing, no reflux, no frequent heartburn, no hiatal hernia, no abdominal pain, + constipation, no diarrhea, no hematochezia, no hematemesis, no melena  GU:  no dysuria,  + frequency, no urinary tract infection, no hematuria, + enlarged prostate, no kidney stones, no kidney disease  Vascular:  + pain suggestive of claudication, no pain in feet, + leg cramps, no varicose veins, no DVT, no non-healing foot ulcer  Neuro:   + stroke, no TIA's, no seizures, no headaches, no temporary blindness one eye,  no slurred speech, no peripheral neuropathy, + chronic pain, + instability of gait, no memory/cognitive dysfunction  Musculoskeletal: + arthritis, no joint swelling, no myalgias, + difficulty walking, limited mobility   Skin:   no rash, no itching, no skin infections, no pressure sores or ulcerations  Psych:   no anxiety, no depression, no nervousness, no unusual recent stress  Eyes:   + blurry vision, no floaters, no recent vision changes, does not wears glasses or contacts  ENT:   + hearing loss, no loose or painful teeth, no dentures, last saw dentist 2/19  Hematologic:  + easy bruising, no abnormal bleeding, no clotting disorder, no frequent epistaxis  Endocrine:  no diabetes, does not check CBG's at home           Physical Exam:   BP 112/73 (BP Location: Left Arm, Patient Position: Sitting, Cuff Size: Large)   Pulse 74   Resp 18   Ht 5' 10.5" (1.791 m)   Wt 171 lb 3.2 oz (77.7 kg)   SpO2 97% Comment: RA  BMI 24.22 kg/m   General:  Elderly, frail-appearing  HEENT:  Unremarkable   Neck:   no JVD, no bruits, no adenopathy   Chest:   Diminished  breath sounds at bases, symmetrical breath sounds, no wheezes, no rhonchi   CV:   RRR, grade IV/VI crescendo/decrescendo murmur heard best at apex,  no diastolic murmur  Abdomen:  soft, non-tender, no masses   Extremities:  warm, well-perfused, pulses diminished, + bilateral LE edema  Rectal/GU  Deferred  Neuro:   Grossly non-focal and symmetrical throughout  Skin:   Clean and dry, no rashes, no breakdown   Diagnostic Tests:  Transthoracic Echocardiography  Patient:    Bentzion, Dauria MR #:       [id number] Study Date: 05/21/2017 Gender:     M Age:        84 Height:     179.1 cm Weight:     76.9 kg BSA:        1.96 m^2 Pt. Status: Room:       [place] [id number]  PERFORMING   [facility], Inpatient  SONOGRAPHER  Darlina Sicilian, RDCS  cc:  -------------------------------------------------------------------  ------------------------------------------------------------------- Indications:      CHF - 428.0.  ------------------------------------------------------------------- History:   PMH:   Atrial fibrillation.  Coronary artery disease. Mitral valve disease.  Stroke.  Risk factors:  Hypertension.  ------------------------------------------------------------------- Study Conclusions  - Left ventricle: The cavity size was normal. There was severe   focal basal hypertrophy of the septum with otherwise mild   concentric hypertrophy. Systolic function was normal. Wall motion   was normal; there were no regional wall motion abnormalities. - Aortic valve: Valve mobility was restricted. There was severe   stenosis. There was no regurgitation. Valve area (VTI): 0.49   cm^2. Valve area (Vmax): 0.47 cm^2. Valve area (Vmean): 0.53   cm^2. - Mitral valve: Severely calcified annulus. Calcification.   Calcification. Mobility was restricted. Flail motion involving   the anterior leaflet. The findings are consistent with severe   stenosis. There was severe  regurgitation directed eccentrically   and posteriorly. Valve area by pressure half-time: 1.63 cm^2.   Valve area by continuity equation (using LVOT flow): 0.98 cm^2. - Left atrium: The atrium was severely dilated. - Right ventricle: The cavity size was normal. Wall thickness was   normal. Systolic function was normal. - Right atrium: The atrium was severely dilated. - Tricuspid valve: There was mild-moderate regurgitation. - Pulmonary arteries: Systolic pressure was mildly increased. PA   peak pressure: 43 mm Hg (S).  Impressions:  - Mitral regurgitation appears moderate visually but is likely   severe due to the flail leaflet with eccentric regurgitation.  ------------------------------------------------------------------- Study data:  Comparison was made to the study of 02/12/2017.  Study status:  Routine.  Procedure:  The patient reported no pain pre or post test. Transthoracic echocardiography. Image quality was adequate.  Study completion:  There were no complications. Transthoracic echocardiography.  M-mode, complete 2D, spectral Doppler, and color Doppler.  Birthdate:  Patient birthdate: Sep 23, 1929.  Age:  Patient is 84 yr old.  Sex:  Gender: male. BMI: 24 kg/m^2.  Blood pressure:     134/79  Patient status: Inpatient.  Study date:  Study date: 05/21/2017. Study time: 08:46 AM.  Location:  Bedside.  -------------------------------------------------------------------  ------------------------------------------------------------------- Left ventricle:  The cavity size was normal. There was severe focal basal hypertrophy of the septum with otherwise mild concentric hypertrophy. Systolic function was normal. Wall motion was normal; there were no regional wall motion abnormalities. The study was not technically sufficient to allow evaluation of LV diastolic dysfunction due to atrial  fibrillation.  ------------------------------------------------------------------- Aortic valve:   Trileaflet; severely thickened, severely calcified leaflets. Valve mobility was restricted.  Doppler:   There was severe stenosis.   There was no regurgitation.    VTI ratio of LVOT to aortic valve: 0.14. Valve area (VTI): 0.49 cm^2. Indexed valve area (VTI): 0.25 cm^2/m^2. Peak velocity ratio of LVOT to aortic valve: 0.13. Valve area (Vmax): 0.47 cm^2. Indexed valve area (Vmax): 0.24 cm^2/m^2. Mean velocity ratio of LVOT to aortic valve: 0.15. Valve area (Vmean): 0.53 cm^2. Indexed valve area (Vmean): 0.27 cm^2/m^2.    Mean gradient (S): 44 mm Hg. Peak gradient (S): 80 mm Hg.  ------------------------------------------------------------------- Aorta:  Aortic root: The aortic root was normal in size.  ------------------------------------------------------------------- Mitral valve:   Severely calcified annulus.  Calcification. Calcification. Mobility was restricted. Flail motion involving the anterior leaflet.  Doppler:   The findings are consistent with severe stenosis.   There was severe regurgitation directed eccentrically and posteriorly.    Valve area by pressure half-time: 1.63 cm^2. Indexed valve area by pressure half-time: 0.83 cm^2/m^2. Valve area by continuity equation (using LVOT flow): 0.98 cm^2. Indexed valve area by continuity equation (using LVOT flow): 0.5 cm^2/m^2.    Mean gradient (D): 11 mm Hg. Peak gradient (D): 19 mm Hg.  ------------------------------------------------------------------- Left atrium:  The atrium was severely dilated.  ------------------------------------------------------------------- Right ventricle:  The cavity size was normal. Wall thickness was normal. Systolic function was normal.  ------------------------------------------------------------------- Pulmonic valve:    Structurally normal valve.   Cusp separation was normal.  Doppler:   Transvalvular velocity was within the normal range. There was no evidence for stenosis. There was no regurgitation.  ------------------------------------------------------------------- Tricuspid valve:   Structurally normal valve.    Doppler: Transvalvular velocity was within the normal range. There was mild-moderate regurgitation.  ------------------------------------------------------------------- Pulmonary artery:   The main pulmonary artery was normal-sized. Systolic pressure was mildly increased.  ------------------------------------------------------------------- Right atrium:  The atrium was  severely dilated.  ------------------------------------------------------------------- Pericardium:  There was no pericardial effusion.  ------------------------------------------------------------------- Systemic veins: Inferior vena cava: The vessel was normal in size. The respirophasic diameter changes were in the normal range (>= 50%), consistent with normal central venous pressure.  ------------------------------------------------------------------- Measurements   Left ventricle                           Value          Reference  LV ID, ED, PLAX chordal          (L)     40    mm       43 - 52  LV ID, ES, PLAX chordal                  31    mm       23 - 38  LV fx shortening, PLAX chordal   (L)     23    %        >=29  LV PW thickness, ED                      12    mm       ----------  IVS/LV PW ratio, ED              (H)     1.67           <=1.3  Stroke volume, 2D                        42    ml       ----------  Stroke volume/bsa, 2D                    21    ml/m^2   ----------  LV e&', lateral                           5.77  cm/s     ----------  LV E/e&', lateral                         37.61          ----------  LV e&', medial                            3.92  cm/s     ----------  LV E/e&', medial                          55.36          ----------  LV e&', average                            4.85  cm/s     ----------  LV E/e&', average                         44.79          ----------    Ventricular septum                       Value          Reference  IVS thickness,  ED                        20    mm       ----------    LVOT                                     Value          Reference  LVOT ID, S                               21    mm       ----------  LVOT area                                3.46  cm^2     ----------  LVOT peak velocity, S                    60.3  cm/s     ----------  LVOT mean velocity, S                    47.4  cm/s     ----------  LVOT VTI, S                              12.1  cm       ----------  LVOT peak gradient, S                    1     mm Hg    ----------    Aortic valve                             Value          Reference  Aortic valve peak velocity, S            448   cm/s     ----------  Aortic valve mean velocity, S            312   cm/s     ----------  Aortic valve VTI, S                      84.8  cm       ----------  Aortic mean gradient, S                  44    mm Hg    ----------  Aortic peak gradient, S                  80    mm Hg    ----------  VTI ratio, LVOT/AV                       0.14           ----------  Aortic valve area, VTI                   0.49  cm^2     ----------  Aortic valve area/bsa, VTI  0.25  cm^2/m^2 ----------  Velocity ratio, peak, LVOT/AV            0.13           ----------  Aortic valve area, peak velocity         0.47  cm^2     ----------  Aortic valve area/bsa, peak              0.24  cm^2/m^2 ----------  velocity  Velocity ratio, mean, LVOT/AV            0.15           ----------  Aortic valve area, mean velocity         0.53  cm^2     ----------  Aortic valve area/bsa, mean              0.27  cm^2/m^2 ----------  velocity    Aorta                                    Value          Reference  Aortic root ID, ED                       32    mm       ----------     Left atrium                              Value          Reference  LA ID, A-P, ES                           48    mm       ----------  LA ID/bsa, A-P                   (H)     2.45  cm/m^2   <=2.2  LA volume, S                             171   ml       ----------  LA volume/bsa, S                         87.2  ml/m^2   ----------  LA volume, ES, 1-p A4C                   158   ml       ----------  LA volume/bsa, ES, 1-p A4C               80.6  ml/m^2   ----------  LA volume, ES, 1-p A2C                   175   ml       ----------  LA volume/bsa, ES, 1-p A2C               89.2  ml/m^2   ----------    Mitral valve                             Value  Reference  Mitral E-wave peak velocity              217   cm/s     ----------  Mitral mean velocity, D                  152   cm/s     ----------  Mitral deceleration time         (H)     264   ms       150 - 230  Mitral pressure half-time                77    ms       ----------  Mitral mean gradient, D                  11    mm Hg    ----------  Mitral peak gradient, D                  19    mm Hg    ----------  Mitral valve area, PHT, DP               1.63  cm^2     ----------  Mitral valve area/bsa, PHT, DP           0.83  cm^2/m^2 ----------  Mitral valve area, LVOT                  0.98  cm^2     ----------  continuity  Mitral valve area/bsa, LVOT              0.5   cm^2/m^2 ----------  continuity  Mitral annulus VTI, D                    42.8  cm       ----------  Mitral maximal regurg velocity,          579   cm/s     ----------  PISA  Mitral regurg VTI, PISA                  135   cm       ----------    Pulmonary arteries                       Value          Reference  PA pressure, S, DP               (H)     43    mm Hg    <=30    Tricuspid valve                          Value          Reference  Tricuspid regurg peak velocity           316   cm/s     ----------  Tricuspid peak RV-RA gradient            40    mm Hg     ----------    Right atrium                             Value          Reference  RA ID, S-I, ES, A4C              (  H)     75.8  mm       34 - 49  RA area, ES, A4C                 (H)     33.1  cm^2     8.3 - 19.5  RA volume, ES, A/L                       123   ml       ----------  RA volume/bsa, ES, A/L                   62.7  ml/m^2   ----------    Systemic veins                           Value          Reference  Estimated CVP                            3     mm Hg    ----------    Right ventricle                          Value          Reference  RV pressure, S, DP               (H)     43    mm Hg    <=30  RV s&', lateral, S                        11.5  cm/s     ----------  Legend: (L)  and  (H)  mark values outside specified reference range.  ------------------------------------------------------------------- Prepared and Electronically Authenticated by  Skeet Latch, MD 2018-12-03T11:32:54    Transesophageal Echocardiography  Patient:    Gabreal, Worton MR #:       [id number] Study Date: 07/20/2017 Gender:     M Age:        47 Height:     180.3 cm Weight:     80.7 kg BSA:        2.02 m^2 Pt. Status: Room:   ADMITTING    Kirk Ruths  ATTENDING    Kirk Ruths  ORDERING     Kirk Ruths  PERFORMING   Kirk Ruths  REFERRING    Kirk Ruths  SONOGRAPHER  Johny Chess, RDCS, CCT  cc:  ------------------------------------------------------------------- LV EF: 55% -   60%  ------------------------------------------------------------------- Indications:      Aortic stenosis 424.1.  ------------------------------------------------------------------- Study Conclusions  - Left ventricle: Hypertrophy was noted. Systolic function was   normal. The estimated ejection fraction was in the range of 55%   to 60%. - Aortic valve: Cusp separation was severely reduced. There was   severe stenosis. - Mitral valve: Mildly calcified  annulus. Prolapse, involving the   anterior leaflet. There was severe regurgitation directed   eccentrically. - Left atrium: The atrium was severely dilated. No evidence of   thrombus in the atrial cavity or appendage. There was spontaneous   echo contrast (&quot;smoke&quot;). - Right atrium: The atrium was moderately dilated. - Atrial septum: No defect or patent foramen ovale was identified. - Tricuspid valve: Mild prolapse. No evidence of vegetation. - Pulmonic valve: No evidence  of vegetation. - Pericardium, extracardiac: A small pericardial effusion was   identified.  Impressions:  - Hypokinesis of the inferolateral wall with overall preserved LV   function; calcified aortic valve with severe AS (mean gradient 56   mmHg); probable ruptured chord of anterior MV leaflet with severe   eccentric MR directed posterolaterally; severe LAE; no LAA   thrombus; moderate RVE; small pericardial effusion; moderate   atherosclerosis descending aorta.  ------------------------------------------------------------------- Study data:   Consent:  The risks, benefits, and alternatives to the procedure were explained to the patient and informed consent was obtained.  Procedure:  Initial setup. The patient was brought to the laboratory. Surface ECG leads were monitored. Sedation. Conscious sedation was administered by cardiology staff. Transesophageal echocardiography. Topical anesthesia was obtained using viscous lidocaine. An adult multiplane transesophageal probe was inserted by the attending cardiologistwithout difficulty. Image quality was adequate.  Study completion:  The patient tolerated the procedure well. There were no complications.  Administered medications:   Midazolam, 3mg , IV.  Fentanyl, 27mcg, IV. Diagnostic transesophageal echocardiography.  2D and color Doppler.  Birthdate:  Patient birthdate: 12-Sep-1929.  Age:  Patient is 84 yr old.  Sex:  Gender: male.    BMI: 24.8 kg/m^2.   Blood pressure: 145/96  Patient status:  Outpatient.  Study date:  Study date: 07/20/2017. Study time: 02:54 PM.  Location:  Endoscopy.  -------------------------------------------------------------------  ------------------------------------------------------------------- Left ventricle:  Hypertrophy was noted. Systolic function was normal. The estimated ejection fraction was in the range of 55% to 60%.  ------------------------------------------------------------------- Aortic valve:   Trileaflet; severely calcified leaflets. Cusp separation was severely reduced.  Doppler:   There was severe stenosis.   There was no significant regurgitation.    Mean gradient (S): 55 mm Hg. Peak gradient (S): 93 mm Hg.  ------------------------------------------------------------------- Aorta:  Descending aorta: The descending aorta had moderate diffuse disease.  ------------------------------------------------------------------- Mitral valve:   Mildly calcified annulus.  Prolapse, involving the anterior leaflet.  Doppler:  There was severe regurgitation directed eccentrically.  ------------------------------------------------------------------- Left atrium:  The atrium was severely dilated.  No evidence of thrombus in the atrial cavity or appendage. There was spontaneous echo contrast (&quot;smoke&quot;).  ------------------------------------------------------------------- Atrial septum:  No defect or patent foramen ovale was identified.   ------------------------------------------------------------------- Right ventricle:  The cavity size was normal. Systolic function was normal.  ------------------------------------------------------------------- Pulmonic valve:    Structurally normal valve.   Cusp separation was normal.  No evidence of vegetation.  Doppler:  There was mild regurgitation.  ------------------------------------------------------------------- Tricuspid valve:   Leaflet separation was normal.  Mild prolapse. No evidence of vegetation.  Doppler:  There was mild regurgitation.   ------------------------------------------------------------------- Right atrium:  The atrium was moderately dilated.  ------------------------------------------------------------------- Pericardium:  A small pericardial effusion was identified.  ------------------------------------------------------------------- Measurements   LVOT                              Value  LVOT ID, S                        20    mm  LVOT area                         3.14  cm^2    Aortic valve                      Value  Aortic valve peak velocity,  S     483   cm/s  Aortic valve mean velocity, S     342   cm/s  Aortic valve VTI, S               117   cm  Aortic mean gradient, S           55    mm Hg  Aortic peak gradient, S           93    mm Hg    Aorta                             Value  Ascending aorta ID, A-P, S        34    mm  Legend: (L)  and  (H)  mark values outside specified reference range.  ------------------------------------------------------------------- Prepared and Electronically Authenticated by  Kirk Ruths 2019-02-01T16:32:49    RIGHT/LEFT HEART CATH AND CORONARY ANGIOGRAPHY  Conclusion   1. Calcified coronary arteries with mild nonobstructive CAD 2. Moderate pulmonary HTN with mPAP 37 mmHg 3. Large V waves 41 mmHg consistent with severe mitral regurgitation  Recommend: continue evaluation by the multidisciplinary heart valve team  Indications   Severe aortic stenosis [I35.0 (ICD-10-CM)]  Procedural Details/Technique   Technical Details INDICATION: Severe aortic stenosis. 84 yo male with severe aortic stenosis and severe mitral regurgitation being considered for TAVR  PROCEDURAL DETAILS: There was an indwelling IV in a right antecubital vein. Using normal sterile technique, the IV was changed out for a 5 Fr brachial sheath over a 0.018 inch  wire. The right wrist was then prepped, draped, and anesthetized with 1% lidocaine. Using the modified Seldinger technique a 5/6 French Slender sheath was placed in the right radial artery. Intra-arterial verapamil was administered through the radial artery sheath. IV heparin was administered after a JR4 catheter was advanced into the central aorta. A Swan-Ganz catheter was used for the right heart catheterization. Standard protocol was followed for recording of right heart pressures and sampling of oxygen saturations. Fick cardiac output was calculated. Standard Judkins catheters were used for selective coronary angiography. The aortic valve was not crossed as there is known severe aortic stenosis. There were no immediate procedural complications. The patient was transferred to the post catheterization recovery area for further monitoring.    Estimated blood loss <50 mL.  During this procedure the patient was administered the following to achieve and maintain moderate conscious sedation: Versed 1 mg, Fentanyl 25 mcg, while the patient's heart rate, blood pressure, and oxygen saturation were continuously monitored. The period of conscious sedation was 23 minutes, of which I was present face-to-face 100% of this time.  Coronary Findings   Diagnostic  Dominance: Right  Left Main  Dist LM to Ost LAD lesion 30% stenosed  Dist LM to Ost LAD lesion is 30% stenosed. there is mild tapering of the distal left main without significant obstruction  Left Anterior Descending  There is mild diffuse disease throughout the vessel. The vessel is severely calcified. The LAD is heavily calcified but there is no obstructive disease present. There is mild diffuse stenosis present.  Ramus Intermedius  Vessel is large. Large intermediate branch without stenosis  Left Circumflex  The vessel exhibits minimal luminal irregularities.  Right Coronary Artery  There is mild diffuse disease throughout the vessel. Large,  dominant RCA, without obstructive disease  Prox RCA to Mid RCA lesion 30% stenosed  Prox  RCA to Mid RCA lesion is 30% stenosed. The lesion is moderately calcified.  Right Posterior Descending Artery  Vessel is angiographically normal.  Inferior Septal  Vessel is large in size.  Intervention   No interventions have been documented.  Coronary Diagrams   Diagnostic Diagram       Implants     No implant documentation for this case.  MERGE Images   Show images for CARDIAC CATHETERIZATION   Link to Procedure Log   Procedure Log    Hemo Data    Most Recent Value  Fick Cardiac Output 4.92 L/min  Fick Cardiac Output Index 2.51 (L/min)/BSA  RA A Wave 5 mmHg  RA V Wave 8 mmHg  RA Mean 6 mmHg  RV Systolic Pressure 62 mmHg  RV Diastolic Pressure -2 mmHg  RV EDP 6 mmHg  PA Systolic Pressure 60 mmHg  PA Diastolic Pressure 23 mmHg  PA Mean 37 mmHg  PW A Wave 22 mmHg  PW V Wave 41 mmHg  PW Mean 25 mmHg  AO Systolic Pressure 034 mmHg  AO Diastolic Pressure 52 mmHg  AO Mean 69 mmHg  QP/QS 1  TPVR Index 14.74 HRUI  TSVR Index 27.48 HRUI  PVR SVR Ratio 0.19  TPVR/TSVR Ratio 0.54     Cardiac TAVR CT  TECHNIQUE: The patient was scanned on a [employer]. A 120 kV retrospective scan was triggered in the descending thoracic aorta at 111 HU's. Gantry rotation speed was 250 msecs and collimation was .6 mm. 5 mg of iv Metoprolol and no nitro were given. The 3D data set was reconstructed in 5% intervals of the R-R cycle. Systolic and diastolic phases were analyzed on a dedicated work station using MPR, MIP and VRT modes. The patient received 80 cc of contrast.  FINDINGS: Aortic Valve: Trileaflet, severely thickened and calcified aortic valve with severely restricted leaflet opening and only minimal calcifications extending into the LVOT.  Aorta: Normal size, moderate diffuse calcifications, no dissection.  Sinotubular Junction: 31 x 29 mm  Ascending  Thoracic Aorta: 35 x 35 mm  Aortic Arch: 29 x 26 mm  Descending Thoracic Aorta: 28 x 26 mm  Sinus of Valsalva Measurements:  Non-coronary: 33 mm  Right -coronary: 35 mm  Left -coronary: 35 mm  Coronary Artery Height above Annulus:  Left Main: 17 mm  Right Coronary: 16 mm  Virtual Basal Annulus Measurements:  Maximum/Minimum Diameter: 31.2 x 21.9 mm  Mean Diameter: 25.1 mm  Perimeter: 82.6 mm  Area: 493 mm2  Optimum Fluoroscopic Angle for Delivery: LAU 14 CAU 12  IMPRESSION: 1. Trileaflet, severely thickened and calcified aortic valve with severely restricted leaflet opening and only minimal calcifications extending into the LVOT. Annular measurements suitable for delivery of a 26 mm Edwards-SAPIEN 3 valve.  2. Sufficient coronary to annulus distance.  3. Optimum Fluoroscopic Angle for Delivery:  LAU 14 CAU 12  4. No thrombus in the left atrial appendage.   Electronically Signed   By: Ena Dawley   On: 08/06/2017 18:09   CT ANGIOGRAPHY CHEST, ABDOMEN AND PELVIS  TECHNIQUE: Multidetector CT imaging through the chest, abdomen and pelvis was performed using the standard protocol during bolus administration of intravenous contrast. Multiplanar reconstructed images and MIPs were obtained and reviewed to evaluate the vascular anatomy.  CONTRAST:  140mL ISOVUE-370 IOPAMIDOL (ISOVUE-370) INJECTION 76%  COMPARISON:  No priors  FINDINGS: CTA CHEST FINDINGS  Cardiovascular: Heart size is enlarged with severe left atrial dilatation. There is no significant pericardial fluid,  thickening or pericardial calcification. There is aortic atherosclerosis, as well as atherosclerosis of the great vessels of the mediastinum and the coronary arteries, including calcified atherosclerotic plaque in the left main, left anterior descending, left circumflex and right coronary arteries. Large area of myocardial thinning involving the lateral,  inferolateral and inferior wall segments in the mid left ventricle, indicative of fibrofatty metaplasia and scarring from prior circumflex territory myocardial infarction(s). Severe thickening calcification of the aortic valve. Thickening calcification of the mitral valve and mitral annulus.  Mediastinum/Lymph Nodes: No pathologically enlarged mediastinal or hilar lymph nodes. Esophagus is unremarkable in appearance. No axillary lymphadenopathy.  Lungs/Pleura: Moderate bilateral pleural effusions lying dependently. No acute consolidative airspace disease. No suspicious appearing pulmonary nodules or masses. Linear scarring in the left upper lobe.  Musculoskeletal/Soft Tissues: There are no aggressive appearing lytic or blastic lesions noted in the visualized portions of the skeleton.  CTA ABDOMEN AND PELVIS FINDINGS  Hepatobiliary: No suspicious cystic or solid hepatic lesions. No intra or extrahepatic biliary ductal dilatation. Gallbladder is normal in appearance.  Pancreas: No pancreatic mass. No pancreatic ductal dilatation. No pancreatic or peripancreatic fluid or inflammatory changes.  Spleen: Unremarkable.  Adrenals/Urinary Tract: 1.6 cm exophytic simple cyst extending off the lateral aspect of the upper pole of the right kidney. 1.6 cm partially exophytic simple cyst in the interpolar region of the left kidney laterally. No suspicious renal lesions. Bilateral adrenal glands are normal in appearance. No hydroureteronephrosis. 3.9 cm left-sided bladder diverticulum with some mild irregularity in the dependent portion of the diverticulum (axial image 191 of series 15). Small amount of contrast material lying dependently in the urinary bladder, presumably related to test injection.  Stomach/Bowel: Normal appearance of the stomach. No pathologic dilatation of small bowel or colon. Normal appendix.  Vascular/Lymphatic: Aortic atherosclerosis, with vascular  findings and measurements pertinent to potential TAVR procedure, as detailed below. No aneurysm or dissection noted in the abdominal or pelvic vasculature. Celiac axis, superior mesenteric artery and inferior mesenteric artery and their major branches are widely patent without hemodynamically significant stenosis. Single renal arteries are widely patent bilaterally. No lymphadenopathy noted in the abdomen or pelvis.  Reproductive: Prostate gland and seminal vesicles are unremarkable in appearance.  Other: No significant volume of ascites.  No pneumoperitoneum.  Musculoskeletal: Chronic appearing compression fracture of L3 with approximately 20% loss of anterior vertebral body height. There are no aggressive appearing lytic or blastic lesions noted in the visualized portions of the skeleton.  VASCULAR MEASUREMENTS PERTINENT TO TAVR:  AORTA:  Minimal Aortic Diameter-16 x 16 mm  Severity of Aortic Calcification-severe  RIGHT PELVIS:  Right Common Iliac Artery -  Minimal Diameter-10.1 x 10.5 mm  Tortuosity-mild  Calcification-moderate  Right External Iliac Artery -  Minimal Diameter-8.9 x 8.9 mm  Tortuosity-severe  Calcification-mild  Right Common Femoral Artery -  Minimal Diameter-9.4 x 10.4 mm  Tortuosity-mild  Calcification-mild  LEFT PELVIS:  Left Common Iliac Artery -  Minimal Diameter-11.1 x 10.0 mm  Tortuosity-mild  Calcification-moderate  Left External Iliac Artery -  Minimal Diameter-8.6 x 7.7 mm  Tortuosity-moderate  Calcification-mild  Left Common Femoral Artery -  Minimal Diameter-8.7 x 8.3 mm  Tortuosity-mild  Calcification-mild  Review of the MIP images confirms the above findings.  IMPRESSION: 1. Vascular findings and measurements pertinent to potential TAVR procedure, as detailed above. 2. Severe thickening calcification of the aortic valve, compatible with the reported clinical history  of severe aortic stenosis. 3. Aortic atherosclerosis, in addition to left main and 3 vessel  coronary artery disease. Evidence of prior left circumflex territory myocardial infarction(s), as above. 4. Severe thickening calcification of the mitral valve and mitral annulus. 5. Cardiomegaly with severe left atrial dilatation. 6. Moderate bilateral pleural effusions lying dependently are simple in appearance. 7. Large left-sided bladder wall diverticulum with some mild irregularity in the dependent portion of the diverticulum. This could simply represent some retained debris, however, nonemergent Urologic consultation is suggested, particularly if there is any history of hematuria as a urothelial neoplasm in this region is not entirely excluded. 8. Additional incidental findings, as above.  Aortic Atherosclerosis (ICD10-I70.0).   Electronically Signed   By: Vinnie Langton M.D.   On: 08/06/2017 15:01   STS Risk Calculator  Procedure: Isolated AVR CALCULATE  Risk of Mortality:  3.237%   Renal Failure:  1.824%   Permanent Stroke:  1.582%   Prolonged Ventilation:  10.243%   DSW Infection:  0.104%   Reoperation:  5.634%   Morbidity or Mortality:  20.367%   Short Length of Stay:  17.255%   Long Length of Stay:  10.626%    Procedure: Isolated MVR CALCULATE  Risk of Mortality:  5.516%   Renal Failure:  1.819%   Permanent Stroke:  3.424%   Prolonged Ventilation:  12.220%   DSW Infection:  0.118%   Reoperation:  7.666%   Morbidity or Mortality:  19.447%   Short Length of Stay:  14.468%   Long Length of Stay:  12.244%      Impression:  Patient has stage D severe symptomatic aortic stenosis, severe symptomatic primary mitral regurgitation, and long-standing persistent atrial fibrillation.  He describes long history of progressive symptoms of exertional shortness of breath, orthopnea, and lower extremity edema consistent with chronic diastolic congestive heart failure, [place] Heart Association functional class III.  He was hospitalized recently with class IV symptoms and fluid overload.  His functional status has apparently declined dramatically over the past year and a half.  He has mobility issues dating back to embolic stroke in 9518, and more recently he has been severely limited because of severe chronic pain in his lower back and both legs.    I personally reviewed the patient's recent transthoracic and transesophageal echocardiograms, diagnostic cardiac catheterization, and CT angiograms.  Patient has severe aortic stenosis.  The aortic valve is trileaflet with severe thickening, calcification, and restricted leaflet mobility involving all 3 leaflets of the aortic valve.  On TEE peak velocity across the aortic valve was measured 4.8 m/s corresponding to mean transvalvular gradient estimated 55 mmHg.  Left ventricular systolic function is mildly reduced.  Patient has severe primary mitral regurgitation.  There is severe calcification that appears to be related to degenerative disease (rather than rheumatic) with some narrowing of the mitral valve and some restriction of the posterior leaflet mobility.  There is ruptured primary chordae tendinae from the middle scallop of the anterior leaflet of the mitral valve causing severe mitral regurgitation.  The jet of regurgitation is eccentric and courses around the posterior aspect of the dilated left atrium.  Right ventricle was mild to moderately dilated.  There is only mild tricuspid regurgitation.  Diagnostic cardiac catheterization reveals calcified coronary arteries with patency of the stent in left circumflex and only mild nonobstructive disease.  There was mild pulmonary hypertension.  The most definitive management for this patient would consist of aortic valve replacement, mitral valve replacement, and Maze procedure.  Due to the severity of calcification in the mitral valve, the likelihood of successful mitral  valve  repair would be reduced.  However, I would not consider this patient a candidate for elective double valve replacement with or without Maze procedure because of his advanced age and very poor functional status with limited mobility, advanced congestive heart failure, and significant chronic pain.  Alternative options include long-term palliative medical therapy versus transcatheter aortic valve replacement with or without an attempt to treat the patient's mitral regurgitation.  I suspect the patient would benefit from transcatheter aortic valve replacement in terms of improved control of symptoms of congestive heart failure, but it is unlikely that his mitral regurgitation will improve substantially.  The patient's mitral regurgitation may not be amenable to percutaneous edge to edge [place] due to the relatively small size of the valve, presence of pre-existing gradient across the mitral valve, and limited surface area of the posterior leaflet.  At present there are no commercially available catheter-based options for dealing with the patient's mitral regurgitation, although there are some early clinical trials involving experimental transcatheter mitral valve replacement.  If the patient did really well following transcatheter aortic valve replacement, minimally invasive mitral valve replacement could be considered.  However, the patient's current functional status is quite poor, and I am not sure that he will improve enough to be considered a candidate for conventional surgical intervention.   Plan:  The patient and his family were counseled at length regarding treatment alternatives for management of severe symptomatic aortic stenosis and mitral regurgitation. Alternative approaches such as conventional aortic valve replacement with mitral valve repair/replacement, transcatheter aortic valve replacement with or without delayed intervention to treat his mitral regurgitation, and continued medical therapy  without intervention were compared and contrasted at length.  The risks associated with conventional surgical aortic and valve replacement were discussed in detail, including why I would not consider this patient a candidate for conventional surgery.  Issues specific to transcatheter aortic valve replacement were discussed including questions about long term valve durability, the potential for paravalvular leak, possible increased risk of need for permanent pacemaker placement, and other technical complications related to the procedure itself.  Long-term prognosis with medical therapy was discussed. This discussion was placed in the context of the patient's own specific clinical presentation and past medical history.  All of their questions have been addressed.  The patient desires to proceed with TAVR in the near future.  We tentatively plan to proceed on September 04, 2017.  Following the decision to proceed with transcatheter aortic valve replacement, a discussion has been held regarding what types of management strategies would be attempted intraoperatively in the event of life-threatening complications, including whether or not the patient would be considered a candidate for the use of cardiopulmonary bypass and/or conversion to open sternotomy for attempted surgical intervention.  The patient has been advised of a variety of complications that might develop including but not limited to risks of death, stroke, paravalvular leak, aortic dissection or other major vascular complications, aortic annulus rupture, device embolization, cardiac rupture or perforation, mitral regurgitation, acute myocardial infarction, arrhythmia, heart block or bradycardia requiring permanent pacemaker placement, congestive heart failure, respiratory failure, renal failure, pneumonia, infection, other late complications related to structural valve deterioration or migration, or other complications that might ultimately cause a temporary  or permanent loss of functional independence or other long term morbidity.  The patient provides full informed consent for the procedure as described and all questions were answered.   I spent in excess of 90 minutes during the conduct of this office consultation and >  50% of this time involved direct face-to-face encounter with the patient for counseling and/or coordination of their care.     Valentina Gu. Roxy Manns, MD 08/17/2017 2:01 PM

## 2017-08-21 ENCOUNTER — Telehealth: Payer: Self-pay

## 2017-08-21 ENCOUNTER — Encounter: Payer: Medicare HMO | Admitting: Thoracic Surgery (Cardiothoracic Vascular Surgery)

## 2017-08-21 NOTE — Telephone Encounter (Signed)
The pt's wife contacted me to inquire about getting oxygen for the pt to use at night time.  She states that the pt was speaking with his daughter in [place] by phone this morning and he mentioned that he is SOB at night and uses pillows to prop himself up.  His daughter felt like the pt may need oxygen at night so that he can rest.  I advised Mrs Mohammad that the pt would require further evaluation through an office visit or overnight pulse oximetry to document the need for oxygen.  The pt's weight and swelling have remained stable and per the wife's knowledge his SOB at night has not worsened.The pt currently takes Furosemide 40mg  daily and weighs on a daily basis.   She will speak with the pt and if this is something that he would like to evaluate further then he will contact Dr Jacalyn Lefevre office to arrange follow-up.  Pt's wife agreed with plan.   I also made her aware that the TAVR team discussed Mr Hockey anticoagulation management prior to TAVR on 09/04/2017.  Due to previous issues with bleeding while on Lovenox the team felt that it would be best to have the pt hold warfarin 5 days prior to TAVR and not bridge with lovenox.  I will forward this information to the anticoagulation clinic.  The pt has a pending appointment with them on 08/24/17.

## 2017-08-23 ENCOUNTER — Telehealth: Payer: Self-pay | Admitting: Cardiology

## 2017-08-23 ENCOUNTER — Ambulatory Visit: Payer: Medicare HMO | Admitting: Cardiology

## 2017-08-23 NOTE — Telephone Encounter (Signed)
Spoke with pt and wife, the patient is having SOB and orthopnea. He reports swelling of his feet and ankles.he also reports a weight gain of 2.5 lbs. Okay given for the patient to take 40 mg bid for the next 3 days. They will call after 3 days if continues to have problems.

## 2017-08-23 NOTE — Telephone Encounter (Signed)
Unable to reach pt or leave a message  

## 2017-08-23 NOTE — Telephone Encounter (Signed)
New Message:   Please call,wants to increase his Lasix to 7 mg,She wants to know if he can take it in the morning and night? He is not flushing much at night and it is building up in his chest..This makes it hard for him to sleep at night.

## 2017-08-27 ENCOUNTER — Other Ambulatory Visit: Payer: Self-pay

## 2017-08-27 ENCOUNTER — Ambulatory Visit (INDEPENDENT_AMBULATORY_CARE_PROVIDER_SITE_OTHER): Payer: Medicare HMO | Admitting: Pharmacist

## 2017-08-27 DIAGNOSIS — I482 Chronic atrial fibrillation, unspecified: Secondary | ICD-10-CM

## 2017-08-27 DIAGNOSIS — I350 Nonrheumatic aortic (valve) stenosis: Secondary | ICD-10-CM

## 2017-08-27 DIAGNOSIS — Z5181 Encounter for therapeutic drug level monitoring: Secondary | ICD-10-CM

## 2017-08-27 LAB — POCT INR: INR: 2.8

## 2017-08-28 ENCOUNTER — Telehealth: Payer: Self-pay | Admitting: Cardiology

## 2017-08-28 NOTE — Telephone Encounter (Signed)
Patient of Dr. Stanford Breed Scheduled for TAVR on 3/19 Spoke with triage nurse on 3/7: Cristopher Estimable, RN Note    Spoke with pt and wife, the patient is having SOB and orthopnea. He reports swelling of his feet and ankles.he also reports a weight gain of 2.5 lbs. Okay given for the patient to take 40 mg bid for the next 3 days. They will call after 3 days if continues to have problems.     Returned call to wife. She reports there has not been much improvement with SOB with increased lasix dose 80mg  for 3 days, but she reports his body responds well to lasix, good UOP. They would like a temporarily help him until TAVR on 3/19. They are aware that the problem is the heart valve. She reports patient has swelling in feet/ankles. Wife reports weight fluctuates but patient has not gained 3lbs in 24hrs or 5lbs in 1 week. Wife spoke with Ander Purpura, RN with valve clinic yesterday as well.   Patient is scheduled to see Dr. Cyndia Bent tomorrow, has labs scheduled for Friday.  Advised would request input from MD on SOB and recommendations

## 2017-08-28 NOTE — Telephone Encounter (Signed)
New message   Patient spouse calling with concerns about taking Lasix and SOB.     Pt c/o Shortness Of Breath: STAT if SOB developed within the last 24 hours or pt is noticeably SOB on the phone  1. Are you currently SOB (can you hear that pt is SOB on the phone)? SPOUSE ON PHONE  2. How long have you been experiencing SOB? WEEKS  3. Are you SOB when sitting or when up moving around? SITTING AND MOVING  4. Are you currently experiencing any other symptoms? NO

## 2017-08-28 NOTE — Telephone Encounter (Signed)
Attempted to contact patient's wife. No answer, VM full.   Message sent in MyChart  Medication list has been updated

## 2017-08-28 NOTE — Telephone Encounter (Signed)
Continue lasix 80 mg daily; bmet in am Kirk Ruths

## 2017-08-29 ENCOUNTER — Other Ambulatory Visit: Payer: Self-pay

## 2017-08-29 ENCOUNTER — Institutional Professional Consult (permissible substitution): Payer: Medicare HMO | Admitting: Surgery

## 2017-08-29 ENCOUNTER — Ambulatory Visit: Payer: Medicare HMO | Attending: Cardiovascular Disease | Admitting: Physical Therapy

## 2017-08-29 ENCOUNTER — Encounter: Payer: Self-pay | Admitting: Surgery

## 2017-08-29 VITALS — BP 102/69 | HR 77 | Resp 18 | Ht 70.5 in | Wt 172.0 lb

## 2017-08-29 DIAGNOSIS — R2689 Other abnormalities of gait and mobility: Secondary | ICD-10-CM | POA: Diagnosis not present

## 2017-08-29 DIAGNOSIS — I350 Nonrheumatic aortic (valve) stenosis: Secondary | ICD-10-CM | POA: Diagnosis not present

## 2017-08-29 NOTE — Therapy (Signed)
[place], [place], [address] Phone: [phone]   Fax:  [phone]  Physical Therapy Evaluation  Patient Details  Name: Wayne [name] MRN: [id number] Date of Birth: 04-15-1930 Referring Provider: Sherren Mocha MD   Encounter Date: 08/29/2017  PT End of Session - 08/29/17 1303    Visit Number  1    Number of Visits  1    Date for PT Re-Evaluation  08/30/17    PT Start Time  1019    PT Stop Time  1103    PT Time Calculation (min)  44 min    Equipment Utilized During Treatment  Gait belt    Activity Tolerance  Patient tolerated treatment well    Behavior During Therapy  [facility] for tasks assessed/performed       Past Medical History:  Diagnosis Date  . Aortic stenosis   . Arthritis   . Atrial fibrillation (HCC)    Chronic  . Atrial fibrillation, chronic ([place])   . BPH (benign prostatic hyperplasia)   . CAD (coronary artery disease)   . Chronic anticoagulation   . History of chicken pox   . HTN (hypertension)   . Mitral stenosis   . Old MI (myocardial infarction) 2007   s/p PCI to distal OM (no stent)  . Severe mitral regurgitation   . Stroke, embolic [facility])     Past Surgical History:  Procedure Laterality Date  . CORONARY ANGIOPLASTY  2007   Distal OM  . PROSTATE BIOPSY    . RIGHT/LEFT HEART CATH AND CORONARY ANGIOGRAPHY N/A 08/01/2017   Procedure: RIGHT/LEFT HEART CATH AND CORONARY ANGIOGRAPHY;  Surgeon: Sherren Mocha, MD;  Location: [place] [facility];  Service: Cardiovascular;  Laterality: N/A;  . TEE WITHOUT CARDIOVERSION N/A 07/20/2017   Procedure: TRANSESOPHAGEAL ECHOCARDIOGRAM (TEE);  Surgeon: Lelon Perla, MD;  Location: [facility];  Service: Cardiovascular;  Laterality: N/A;    There were no vitals filed for this visit.   Subjective Assessment - 08/29/17 1026    Subjective  pt is a 84 y.o M  reporting that issues with breathing that has started 3-4 months ago and per  family issue with the heart starting in the last years. reports hx or low back pain with bil LE pain that has been going on for more than a year. pain in the back progressively worsens throughout the day and is worse at night.     How long can you sit comfortably?  depends on the time of day, worse in the evening.     How long can you stand comfortably?  15-20 min    How long can you walk comfortably?  last couple of weeks almost none    Patient Stated Goals  to get heart better    Currently in Pain?  Yes    Pain Score  1     Pain Location  Back         [facility] PT Assessment - 08/29/17 1032      Assessment   Medical Diagnosis  Severe AS    Referring Provider  Sherren Mocha MD    Onset Date/Surgical Date  -- 1 year ago    Hand Dominance  Right      Precautions   Precautions  None      Restrictions   Weight Bearing Restrictions  No      Balance Screen   Has the patient fallen in the past 6 months  No  Has the patient had a decrease in activity level because of a fear of falling?   No    Is the patient reluctant to leave their home because of a fear of falling?   No      Home Environment   Living Environment  Private residence    Type of [place] to enter    Entrance Stairs-Number of Steps  1    Entrance Stairs-Rails  -- post to reach    Home Layout  Multi-level    Alternate Level Stairs-Number of Steps  13    Alternate Level Stairs-Rails  Can reach both    [place] - 2 wheels;Cane - single point      Prior Function   Level of Independence  Needs assistance with gait;Independent with basic ADLs    Vocation  Retired      Associate Professor   Overall Cognitive Status  Within Functional Limits for tasks assessed      Posture/Postural Control   Posture/Postural Control  Postural limitations    Postural Limitations  Rounded Shoulders;Forward head;Flexed trunk      ROM / Strength   AROM / PROM / Strength  AROM;Strength      AROM    Overall AROM   Within functional limits for tasks performed      Strength   Overall Strength Comments  overall 4/5 UE/LE    Right Hand Grip (lbs)  55 R hand dominat    Left Hand Grip (lbs)  51      Ambulation/Gait   Ambulation/Gait  Yes    Assistive device  Straight cane    Gait Pattern  Decreased stride length;Step-through pattern;Trendelenburg;Shuffle;Trunk flexed    Gait Comments  pt demonstrated 80% limited per age/gender norm       [facility] Pre-Surgical Assessment - 08/29/17 0001    5 Meter Walk Test- trial 1  13 sec    5 Meter Walk Test- trial 2  14 sec.     5 Meter Walk Test- trial 3  15 sec.    5 meter walk test average  14 sec    4 Stage Balance Test Position  1    comment  10 sec    Sit To Stand Test- trial 1  -- unable to peform test    ADL/IADL Independent with:  Bathing;Dressing;Meal prep;Finances    ADL/IADL Needs Assistance with:  [employer] work    ADL/IADL Fraility Index  Vulnerable    6 Minute Walk- Baseline  yes    BP (mmHg)  110/64    HR (bpm)  70    02 Sat (%RA)  99 %    Modified Borg Scale for Dyspnea  1- Very mild shortness of breath    Perceived Rate of Exertion (Borg)  11- Fairly light    6 Minute Walk Post Test  yes    BP (mmHg)  122/81    HR (bpm)  84    02 Sat (%RA)  94 %    Modified Borg Scale for Dyspnea  4- somewhat severe    Perceived Rate of Exertion (Borg)  13- Somewhat hard    Aerobic Endurance Distance Walked  261    Endurance additional comments  pt had to take a break at 133ft (3:13) and rested for 1:22 and walked remaining 76 ft.            Objective measurements completed on examination: See above findings.  PT Education - 08/29/17 1302    Education provided  Yes    Education Details  frailty status and benefits of using walker for safety with community ambulation.     Person(s) Educated  Patient    Methods  Explanation;Verbal cues    Comprehension  Verbalized understanding;Verbal cues required                   Plan - 08/29/17 1302    Clinical Impression Statement  See assessment in note    Clinical Presentation  Stable    Clinical Decision Making  Low    PT Frequency  One time visit    PT Next Visit Plan  one time visit for TAVR     Consulted and Agree with Plan of Care  Patient       Clinical Impression Statement: Pt is a 84 yo M presenting to OP PT for evaluation prior to possible TAVR surgery due to severe aortic stenosis. Pt reports onset of SOB approximately 1 year  ago. Symptoms are limiting walking/ standing. Pt presents with functional ROM and strength, poor balance and is 1 at high fall risk 4 stage balance test, slow walking speed and poor aerobic endurance per 6 minute walk test. Pt ambulated 185 feet before requesting a seated rest break at 3:13 lasting 1:22. At time of rest, patient's HR was 84  bpm and O2 was 90% on room air. Pt reported 4/10 shortness of breath on modified scale for dyspnea. Pt able to resume after rest and ambulate an additional 67 feet. Pt ambulated a total of 261 feet in 6 minute walk.  Based on the Short Physical Performance Battery, patient has a frailty rating of 2/12 with </= 5/12 considered frail.    Patient demonstrated the following deficits and impairments:  Cardiopulmonary status limiting activity  Visit Diagnosis: Other abnormalities of gait and mobility     Problem List Patient Active Problem List   Diagnosis Date Noted  . Severe mitral regurgitation   . Insomnia 07/10/2017  . High risk medication use 07/10/2017  . Long term current use of anticoagulant 07/10/2017  . Acute on chronic diastolic CHF (congestive heart failure) ([place]) 05/20/2017  . Hyponatremia 05/20/2017  . Severe aortic stenosis 05/20/2017  . Urinary retention   . Arthritis of facet joints at multiple vertebral levels ([place]) 04/26/2017  . Spinal stenosis at L4-L5 level 04/26/2017  . Lumbar radiculopathy, chronic 04/26/2017  . Benign colonic polyp- 25  yrs ago.  11/16/2016  . Vitamin D deficiency 11/16/2016  . H/O non anemic vitamin B12 deficiency 11/16/2016  . Mild Colonic dysmotility 11/16/2016  . Hypokalemia 09/13/2016  . BPH with obstruction/lower urinary tract symptoms 09/13/2016  . Urinary frequency 07/10/2016  . Left bundle branch block (LBBB) on electrocardiogram 06/30/2016  . History of stroke 06/30/2016  . Generalized weakness 06/30/2016  . Osteoarthritis 04/04/2016  . Encounter for therapeutic drug monitoring 08/06/2013  . Chronic anticoagulation   . HTN (hypertension)   . CAD (coronary artery disease)   . Chronic atrial fibrillation ([place]) 09/13/2010  . h/o CVA (cerebrovascular accident due to intracerebral hemorrhage) 09/13/2010   Starr Lake PT, DPT, LAT, ATC  08/29/17  1:28 PM      [facility] [address] [place], [place], [address] Phone: [phone]   Fax:  [phone]  Name: Wayne [name] MRN: [id number] Date of Birth: [birth year]

## 2017-08-29 NOTE — Telephone Encounter (Signed)
LMTCB + that MD recommendations were sent to MyChart

## 2017-08-29 NOTE — Telephone Encounter (Signed)
The patient's wife has been made aware to continue the 80 mg daily of the lasix. He will be having pre-procedure labs on Friday for Dr. Cyndia Bent. He had an appointment today with Dr. Cyndia Bent. The patient's wife has been instructed to call back if they need anything further.

## 2017-08-30 NOTE — Progress Notes (Signed)
Patient ID: Wayne [name], male   DOB: March 08, 1930, 84 y.o.   MRN: [id number]  [facility] CONSULTATION REPORT  Referring Provider is Crenshaw, Denice Bors, MD PCP is [name], DO  Chief Complaint  Patient presents with  . Aortic Stenosis    2nd TAVR consultation    HPI:  This patient is an 84 year old gentleman with a history of hypertension, coronary artery disease status post remote myocardial infarction and PCI of a distal OM branch with no stenting in 2007, permanent atrial fibrillation on long-term anticoagulation with Coumadin, previous embolic stroke, BPH with bladder outlet obstruction, severe degenerative arthritis with chronic back and leg pain, mitral regurgitation and mitral stenosis, and aortic stenosis.  In December 2018 he was hospitalized with urinary retention secondary to bladder outlet obstruction and had an acute exacerbation of chronic diastolic heart failure with shortness of breath at rest and severe lower extremity edema.  An echocardiogram on 05/21/2017 showed a trileaflet aortic valve that was severely thickened and calcified with restricted mobility.  The mean gradient was 44 mmHg with a peak gradient of 80 mmHg.  The mitral valve had a severely calcified annulus with restricted mobility of the leaflets and a flail segment of the anterior leaflet.  There was severe regurgitation directed eccentrically and posteriorly.  The mean gradient across the mitral valve was 11 mmHg with a peak gradient of 19 mmHg.  Left atrium was severely dilated.  Left ventricular systolic function was normal.  There was severe focal basal hypertrophy of the septum.  He improved and was sent home.  A TEE on 07/20/2017 showed a left ventricular ejection fraction of 55-60% with some hypokinesis of the inferolateral wall.  There was severe aortic stenosis with a mean transvalvular gradient of 56 mmHg.  There is severe  mitral regurgitation with what appeared to be a ruptured chordae to the anterior leaflet.  There was severe left atrial enlargement with no thrombus present.  He was referred to the multidisciplinary heart valve clinic and was evaluated by Dr. Burt Knack.  He underwent catheterization on August 01, 2017 which showed mild nonobstructive coronary disease.  There is moderate pulmonary hypertension with a mean PA pressure of 37 mmHg and large V waves on the wedge tracing consistent with severe mitral regurgitation.  The patient is married and lives in [place] with his wife.  He is here today with his wife and daughter.  They report that he has had reduced mobility since his stroke in 2007 but has remained reasonably active until the past several months when he has had a progressive downhill course with shortness of breath with any activity, fatigue, orthopnea, and lower extremity edema.  He denies any chest pain or pressure.  He has had some dizziness when he bends over.  His wife and daughter feel like he has gotten significantly worse over the past couple weeks.  Past Medical History:  Diagnosis Date  . Aortic stenosis   . Arthritis   . Atrial fibrillation (HCC)    Chronic  . Atrial fibrillation, chronic ([place])   . BPH (benign prostatic hyperplasia)   . CAD (coronary artery disease)   . Chronic anticoagulation   . History of chicken pox   . HTN (hypertension)   . Mitral stenosis   . Old MI (myocardial infarction) 2007   s/p PCI to distal OM (no stent)  . Severe mitral regurgitation   . Stroke, embolic ([place])  Past Surgical History:  Procedure Laterality Date  . CORONARY ANGIOPLASTY  2007   Distal OM  . PROSTATE BIOPSY    . RIGHT/LEFT HEART CATH AND CORONARY ANGIOGRAPHY N/A 08/01/2017   Procedure: RIGHT/LEFT HEART CATH AND CORONARY ANGIOGRAPHY;  Surgeon: Sherren Mocha, MD;  Location: [facility];  Service: Cardiovascular;  Laterality: N/A;  . TEE WITHOUT CARDIOVERSION N/A  07/20/2017   Procedure: TRANSESOPHAGEAL ECHOCARDIOGRAM (TEE);  Surgeon: Lelon Perla, MD;  Location: [facility] ENDOSCOPY;  Service: Cardiovascular;  Laterality: N/A;    Family History  Problem Relation Age of Onset  . Heart disease Mother   . Heart attack Mother   . Heart disease Father   . Heart attack Father   . Heart attack Brother   . Heart attack Sister   . Stroke Brother     Social History   Socioeconomic History  . Marital status: Married    Spouse name: Not on file  . Number of children: 3  . Years of education: 86  . Highest education level: Not on file  Social Needs  . Financial resource strain: Not on file  . Food insecurity - worry: Not on file  . Food insecurity - inability: Not on file  . Transportation needs - medical: Not on file  . Transportation needs - non-medical: Not on file  Occupational History  . Not on file  Tobacco Use  . Smoking status: Never Smoker  . Smokeless tobacco: Never Used  Substance and Sexual Activity  . Alcohol use: No  . Drug use: No  . Sexual activity: Not on file  Other Topics Concern  . Not on file  Social History Narrative   Fun: Garden, work in the yard, anything physical and walk daily    Current Outpatient Medications  Medication Sig Dispense Refill  . acetaminophen (TYLENOL) 500 MG tablet Take 1,000 mg by mouth every 6 (six) hours as needed for moderate pain or headache.    . alfuzosin (UROXATRAL) 10 MG 24 hr tablet Take 10 mg by mouth daily with breakfast.    . ARTIFICIAL TEAR OP Place 1 drop into both eyes daily as needed (dry eyes).    . calcium carbonate (TUMS - DOSED IN MG ELEMENTAL CALCIUM) 500 MG chewable tablet Chew 1-2 tablets by mouth daily as needed for indigestion or heartburn.    . cholecalciferol 2000 units TABS Take 1 tablet (2,000 Units total) by mouth daily. 30 tablet 0  . Dextromethorphan-Guaifenesin (MUCINEX DM) 30-600 MG TB12 Take 1-2 tablets by mouth every 12 (twelve) hours as needed (for  cough/congestion.).    Marland Kitchen digoxin (LANOXIN) 0.125 MG tablet TAKE ONE TABLET BY MOUTH ONCE DAILY (Patient taking differently: TAKE ONE TABLET (139mcg) BY MOUTH ONCE DAILY) 90 tablet 3  . finasteride (PROSCAR) 5 MG tablet Take 5 mg by mouth at bedtime.     . furosemide (LASIX) 20 MG tablet Take 80 mg by mouth daily.    . Melatonin 1 MG TABS Take 2 mg by mouth at bedtime.     . Menthol, Topical Analgesic, (BIOFREEZE EX) Apply 1 application topically 3 (three) times daily as needed (muscle pain in back). alterrnates between [employer] and Blu Emu    . Menthol, Topical Analgesic, (BLUE-EMU MAXIMUM STRENGTH EX) Apply 1 application topically 3 (three) times daily as needed (muscle pain in back). alterrnates between [employer] and [employer]mu    . methocarbamol (ROBAXIN) 500 MG tablet Take 500 mg by mouth daily as needed (cramps).     Marland Kitchen  metoprolol tartrate (LOPRESSOR) 25 MG tablet TAKE ONE TABLET (25mg ) BY MOUTH TWICE DAILY. (Patient taking differently: Take 25 mg by mouth 2 (two) times daily. ) 135 tablet 3  . polyethylene glycol (MIRALAX / GLYCOLAX) packet Take 17 g by mouth daily.     . potassium chloride (K-DUR) 10 MEQ tablet Take 1 tablet (10 mEq total) by mouth daily as needed. Take potasium if you take lasix. (Patient taking differently: Take 10 mEq by mouth See admin instructions. Take 1 tablet (10 meq) twice daily for 3 days (beginning 08/24/2017) then return to 1 tablet (10 meq) daily in the morning) 30 tablet 2  . sodium chloride (OCEAN) 0.65 % SOLN nasal spray Place 1 spray into both nostrils daily as needed for congestion.    . triamcinolone cream (KENALOG) 0.1 % Apply 1 application topically daily as needed (dry skin).     Marland Kitchen warfarin (COUMADIN) 2.5 MG tablet TAKE 1 TO 2 TABLETS BY MOUTH ONCE DAILY AS DIRECTED BY  COUMADIN  CLINIC (Patient taking differently: Take 2.5-5 mg by mouth See admin instructions. TAKE 1 TABLET (2.5 MG) BY MOUTH ON TUESDAYS AND SATURDAYS TAKE 2 TABLETS (5 MG) BY MOUTH ON  SUNDAY, MONDAY, WEDNESDAYS, THURSDAYS, AND FRIDAYS.) 180 tablet 0   No current facility-administered medications for this visit.     Allergies  Allergen Reactions  . Prednisone Other (See Comments)    Wheezing  . Procaine Hcl Other (See Comments)    Passed out after being given this at dental appointment  . Levofloxacin Rash  . Oxycodone Other (See Comments)    constipation  . Penicillins Rash    Has patient had a PCN reaction causing immediate rash, facial/tongue/throat swelling, SOB or lightheadedness with hypotension: Yes Has patient had a PCN reaction causing severe rash involving mucus membranes or skin necrosis: No Has patient had a PCN reaction that required hospitalization No Has patient had a PCN reaction occurring within the last 10 years: No If all of the above answers are "NO", then may proceed with Cephalosporin use.      Review of Systems:   General:  normal appetite, decreased energy, no weight gain, no weight loss, no fever  Cardiac:  no chest pain with exertion, no chest pain at rest, + SOB with mild exertion, occasional resting SOB, no PND, + orthopnea, no palpitations, + arrhythmia, + atrial fibrillation, + LE edema, + dizzy spells, no syncope  Respiratory:  + shortness of breath, no home oxygen, + productive cough, no dry cough, no bronchitis, no wheezing, no hemoptysis, no asthma, no pain with inspiration or cough, no sleep apnea, no CPAP at night  GI:   no difficulty swallowing, no reflux, no frequent heartburn, no hiatal hernia, no abdominal pain, + constipation, no diarrhea, no hematochezia, no hematemesis, no melena  GU:   no dysuria,  + frequency, no urinary tract infection, no hematuria, + enlarged prostate, no kidney stones, no kidney disease  Vascular:  + pain suggestive of claudication, no pain in feet, + leg cramps, no varicose veins, no DVT, no non-healing foot ulcer  Neuro:   + stroke, no TIA's, no seizures, no headaches, no temporary blindness one  eye,  noo slurred speech, no peripheral neuropathy, + chronic pain, + instability of gait, no memory/cognitive dysfunction  Musculoskeletal: + arthritis, no joint swelling, no myalgias, + difficulty walking, limited mobility   Skin:   no rash, no itching, no skin infections, no pressure sores or ulcerations  Psych:   no anxiety,  no depression, no nervousness, no unusual recent stress  Eyes:   + blurry vision, no floaters, no recent vision changes, not wear glasses   ENT:   + hearing loss, no loose or painful teeth, no dentures, last saw dentist last month  Hematologic:  + easy bruising, no abnormal bleeding, no clotting disorder, no frequent epistaxis  Endocrine:  no diabetes, does not check CBG's at home       Physical Exam:   BP 102/69 (BP Location: Left Arm, Patient Position: Sitting, Cuff Size: Normal)   Pulse 77   Resp 18   Ht 5' 10.5" (1.791 m)   Wt 172 lb (78 kg)   SpO2 97% Comment: RA  BMI 24.33 kg/m   General:  Elderly and frail-appearing  HEENT:  Unremarkable , NCAT, PERLA, EOMI, oropharynx clear  Neck:   no JVD, no bruits, no adenopathy or thyromegaly  Chest:   clear to auscultation, symmetrical breath sounds, no wheezes, no rhonchi   CV:   RRR, grade IV/VI crescendo/decrescendo murmur heard best at RSB,  no diastolic murmur  Abdomen:  soft, non-tender, no masses or organomegaly  Extremities:  warm, well-perfused, pulses diminished in feet, mild bilalateral LE edema  Rectal/GU  Deferred  Neuro:   Grossly non-focal and symmetrical throughout  Skin:   Clean and dry, no rashes, no breakdown   Diagnostic Tests:  *[place]*  *[facility]*  [address], [place] [address]  [phone]  -------------------------------------------------------------------  Transesophageal Echocardiography  Patient: Wayne [name], Wayne [name]  MR #: [id number]  Study Date: 07/20/2017  Gender: M  Age: 16  Height: 180.3 cm  Weight: 80.7 kg  BSA: 2.02 m^2    Pt. Status:  Room:  ADMITTING Kirk Ruths  ATTENDING Kirk Ruths  ORDERING Kirk Ruths  PERFORMING Kirk Ruths  REFERRING Kirk Ruths  SONOGRAPHER Johny Chess, RDCS, CCT  cc:  -------------------------------------------------------------------  LV EF: 55% - 60%  -------------------------------------------------------------------  Indications: Aortic stenosis 424.1.  -------------------------------------------------------------------  Study Conclusions  - Left ventricle: Hypertrophy was noted. Systolic function was  normal. The estimated ejection fraction was in the range of 55%  to 60%.  - Aortic valve: Cusp separation was severely reduced. There was  severe stenosis.  - Mitral valve: Mildly calcified annulus. Prolapse, involving the  anterior leaflet. There was severe regurgitation directed  eccentrically.  - Left atrium: The atrium was severely dilated. No evidence of  thrombus in the atrial cavity or appendage. There was spontaneous  echo contrast (&quot;smoke&quot;).  - Right atrium: The atrium was moderately dilated.  - Atrial septum: No defect or patent foramen ovale was identified.  - Tricuspid valve: Mild prolapse. No evidence of vegetation.  - Pulmonic valve: No evidence of vegetation.  - Pericardium, extracardiac: A small pericardial effusion was  identified.  Impressions:  - Hypokinesis of the inferolateral wall with overall preserved LV  function; calcified aortic valve with severe AS (mean gradient 56  mmHg); probable ruptured chord of anterior MV leaflet with severe  eccentric MR directed posterolaterally; severe LAE; no LAA  thrombus; moderate RVE; small pericardial effusion; moderate  atherosclerosis descending aorta.  -------------------------------------------------------------------  Study data: Consent: The risks, benefits, and alternatives to  the procedure were explained to the patient and informed consent  was obtained.  Procedure: Initial setup. The patient was brought  to the laboratory. Surface ECG leads were monitored. Sedation.  Conscious sedation was administered by cardiology staff.  Transesophageal echocardiography. Topical anesthesia was obtained  using viscous lidocaine.  An adult multiplane transesophageal probe  was inserted by the attending cardiologistwithout difficulty. Image  quality was adequate. Study completion: The patient tolerated the  procedure well. There were no complications. Administered  medications: Midazolam, 3mg , IV. Fentanyl, 3mcg, IV.  Diagnostic transesophageal echocardiography. 2D and color Doppler.  Birthdate: Patient birthdate: [birth year]. Age: Patient is 84 yr  old. Sex: Gender: male. BMI: 24.8 kg/m^2. Blood pressure:  145/96 Patient status: Outpatient. Study date: Study date:  07/20/2017. Study time: 02:54 PM. Location: Endoscopy.  -------------------------------------------------------------------  -------------------------------------------------------------------  Left ventricle: Hypertrophy was noted. Systolic function was  normal. The estimated ejection fraction was in the range of 55% to  60%.  -------------------------------------------------------------------  Aortic valve: Trileaflet; severely calcified leaflets. Cusp  separation was severely reduced. Doppler: There was severe  stenosis. There was no significant regurgitation. Mean  gradient (S): 55 mm Hg. Peak gradient (S): 93 mm Hg.  -------------------------------------------------------------------  Aorta: Descending aorta: The descending aorta had moderate diffuse  disease.  -------------------------------------------------------------------  Mitral valve: Mildly calcified annulus. Prolapse, involving the  anterior leaflet. Doppler: There was severe regurgitation  directed eccentrically.  -------------------------------------------------------------------  Left atrium: The atrium was severely  dilated. No evidence of  thrombus in the atrial cavity or appendage. There was spontaneous  echo contrast (&quot;smoke&quot;).  -------------------------------------------------------------------  Atrial septum: No defect or patent foramen ovale was identified.  -------------------------------------------------------------------  Right ventricle: The cavity size was normal. Systolic function was  normal.  -------------------------------------------------------------------  Pulmonic valve: Structurally normal valve. Cusp separation was  normal. No evidence of vegetation. Doppler: There was mild  regurgitation.  -------------------------------------------------------------------  Tricuspid valve: Leaflet separation was normal. Mild prolapse.  No evidence of vegetation. Doppler: There was mild regurgitation.  -------------------------------------------------------------------  Right atrium: The atrium was moderately dilated.  -------------------------------------------------------------------  Pericardium: A small pericardial effusion was identified.  -------------------------------------------------------------------  Measurements  LVOT Value  LVOT ID, S 20 mm  LVOT area 3.14 cm^2  Aortic valve Value  Aortic valve peak velocity, S 483 cm/s  Aortic valve mean velocity, S 342 cm/s  Aortic valve VTI, S 117 cm  Aortic mean gradient, S 55 mm Hg  Aortic peak gradient, S 93 mm Hg  Aorta Value  Ascending aorta ID, A-P, S 34 mm  Legend:  (L) and (H) mark values outside specified reference range.  -------------------------------------------------------------------  Prepared and Electronically Authenticated by  Kirk Ruths  2019-02-01T16:32:49  MERGE Images  Show images for ECHO TEE  Patient Information  Patient Name Wayne [name], Wayne [name] Sex Male DOB 12/13/1929 SSN [id number]  Reason For Exam  Priority: Routine  Not on file  Surgical History  Surgical History   Procedure  Laterality Date Comment Source  CORONARY ANGIOPLASTY  2007 Distal OM Provider  Other Surgical History   Procedure Laterality Date Comment Source  PROSTATE BIOPSY    Provider  RIGHT/LEFT HEART CATH AND CORONARY ANGIOGRAPHY N/A 08/01/2017 Procedure: RIGHT/LEFT HEART CATH AND CORONARY ANGIOGRAPHY; Surgeon: Sherren Mocha, MD; Location: [facility]; Service: Cardiovascular; Laterality: N/A; Provider  TEE WITHOUT CARDIOVERSION N/A 07/20/2017 Procedure: TRANSESOPHAGEAL ECHOCARDIOGRAM (TEE); Surgeon: Lelon Perla, MD; Location: [facility] ENDOSCOPY; Service: Cardiovascular; Laterality: N/A; Provider  Performing Technologist/Nurse  Performing Technologist/Nurse: Kenney Houseman, RDCS        Aortic Valve Measurements            Stenosis Regurgitation     LVOT diameter 20 mm VTI 117 cm     LVOT area 3.14 cm2      VTI 117 cm      Mean  grad 55 mmHg      Peak grad 93 mmHg      Aortic Root Measurements - End Diastolic    Ao-asc 34 cm    Physicians  Panel Physicians Referring Physician Case Authorizing Physician  Sherren Mocha, MD (Primary)    Procedures  RIGHT/LEFT HEART CATH AND CORONARY ANGIOGRAPHY  Conclusion  1. Calcified coronary arteries with mild nonobstructive CAD  2. Moderate pulmonary HTN with mPAP 37 mmHg  3. Large V waves 41 mmHg consistent with severe mitral regurgitation  Recommend: continue evaluation by the multidisciplinary heart valve team  Indications  Severe aortic stenosis [I35.0 (ICD-10-CM)]  Procedural Details/Technique  Technical Details INDICATION: Severe aortic stenosis. 84 yo male with severe aortic stenosis and severe mitral regurgitation being considered for TAVR  PROCEDURAL DETAILS: There was an indwelling IV in a right antecubital vein. Using normal sterile technique, the IV was changed out for a 5 Fr brachial sheath over a 0.018 inch wire. The right wrist was then prepped, draped, and anesthetized with 1% lidocaine. Using the modified Seldinger  technique a 5/6 French Slender sheath was placed in the right radial artery. Intra-arterial verapamil was administered through the radial artery sheath. IV heparin was administered after a JR4 catheter was advanced into the central aorta. A Swan-Ganz catheter was used for the right heart catheterization. Standard protocol was followed for recording of right heart pressures and sampling of oxygen saturations. Fick cardiac output was calculated. Standard Judkins catheters were used for selective coronary angiography. The aortic valve was not crossed as there is known severe aortic stenosis. There were no immediate procedural complications. The patient was transferred to the post catheterization recovery area for further monitoring.    Estimated blood loss <50 mL.  During this procedure the patient was administered the following to achieve and maintain moderate conscious sedation: Versed 1 mg, Fentanyl 25 mcg, while the patient's heart rate, blood pressure, and oxygen saturation were continuously monitored. The period of conscious sedation was 23 minutes, of which I was present face-to-face 100% of this time.  Coronary Findings  Diagnostic  Dominance: Right  Left Main  Dist LM to Ost LAD lesion 30% stenosed  Dist LM to Ost LAD lesion is 30% stenosed. there is mild tapering of the distal left main without significant obstruction  Left Anterior Descending  There is mild diffuse disease throughout the vessel. The vessel is severely calcified. The LAD is heavily calcified but there is no obstructive disease present. There is mild diffuse stenosis present.  Ramus Intermedius  Vessel is large. Large intermediate branch without stenosis  Left Circumflex  The vessel exhibits minimal luminal irregularities.  Right Coronary Artery  There is mild diffuse disease throughout the vessel. Large, dominant RCA, without obstructive disease  Prox RCA to Mid RCA lesion 30% stenosed  Prox RCA to Mid RCA lesion is 30%  stenosed. The lesion is moderately calcified.  Right Posterior Descending Artery  Vessel is angiographically normal.  Inferior Septal  Vessel is large in size.  Intervention  No interventions have been documented.  Coronary Diagrams  Diagnostic Diagram     Implants     No implant documentation for this case.  MERGE Images  Link to Procedure Log   Show images for CARDIAC CATHETERIZATION Procedure Log  Hemo Data   Most Recent Value  Fick Cardiac Output 4.92 L/min  Fick Cardiac Output Index 2.51 (L/min)/BSA  RA A Wave 5 mmHg  RA V Wave 8 mmHg  RA Mean 6 mmHg  RV  Systolic Pressure 62 mmHg  RV Diastolic Pressure -2 mmHg  RV EDP 6 mmHg  PA Systolic Pressure 60 mmHg  PA Diastolic Pressure 23 mmHg  PA Mean 37 mmHg  PW A Wave 22 mmHg  PW V Wave 41 mmHg  PW Mean 25 mmHg  AO Systolic Pressure 825 mmHg  AO Diastolic Pressure 52 mmHg  AO Mean 69 mmHg  QP/QS 1  TPVR Index 14.74 HRUI  TSVR Index 27.48 HRUI  PVR SVR Ratio 0.19  TPVR/TSVR Ratio 0.54   ADDENDUM REPORT: 08/06/2017 18:09  CLINICAL DATA: 84 year old male with severe aortic stenosis being  evaluated for a TAVR procedure.  EXAM:  Cardiac TAVR CT  TECHNIQUE:  The patient was scanned on a [employer]. A 120 kV  retrospective scan was triggered in the descending thoracic aorta at  111 HU's. Gantry rotation speed was 250 msecs and collimation was .6  mm. 5 mg of iv Metoprolol and no nitro were given. The 3D data set  was reconstructed in 5% intervals of the R-R cycle. Systolic and  diastolic phases were analyzed on a dedicated work station using  MPR, MIP and VRT modes. The patient received 80 cc of contrast.  FINDINGS:  Aortic Valve: Trileaflet, severely thickened and calcified aortic  valve with severely restricted leaflet opening and only minimal  calcifications extending into the LVOT.  Aorta: Normal size, moderate diffuse calcifications, no dissection.  Sinotubular Junction: 31 x 29 mm  Ascending  Thoracic Aorta: 35 x 35 mm  Aortic Arch: 29 x 26 mm  Descending Thoracic Aorta: 28 x 26 mm  Sinus of Valsalva Measurements:  Non-coronary: 33 mm  Right -coronary: 35 mm  Left -coronary: 35 mm  Coronary Artery Height above Annulus:  Left Main: 17 mm  Right Coronary: 16 mm  Virtual Basal Annulus Measurements:  Maximum/Minimum Diameter: 31.2 x 21.9 mm  Mean Diameter: 25.1 mm  Perimeter: 82.6 mm  Area: 493 mm2  Optimum Fluoroscopic Angle for Delivery: LAU 14 CAU 12  IMPRESSION:  1. Trileaflet, severely thickened and calcified aortic valve with  severely restricted leaflet opening and only minimal calcifications  extending into the LVOT. Annular measurements suitable for delivery  of a 26 mm Edwards-SAPIEN 3 valve.  2. Sufficient coronary to annulus distance.  3. Optimum Fluoroscopic Angle for Delivery: LAU 14 CAU 12  4. No thrombus in the left atrial appendage.  Electronically Signed  By: Ena Dawley  On: 08/06/2017 18:09   Addended by Dorothy Spark, MD on 08/06/2017 6:11 PM  Study Result   EXAM:  OVER-READ INTERPRETATION CT CHEST  The following report is an over-read performed by radiologist Dr.  Vinnie Langton of [facility], [place] on 08/06/2017. This  over-read does not include interpretation of cardiac or coronary  anatomy or pathology. The coronary calcium score/coronary CTA  interpretation by the cardiologist is attached.  COMPARISON: None.  FINDINGS:  Extracardiac findings will be described separately under dictation  for contemporaneously obtained CTA chest, abdomen and pelvis.  IMPRESSION:  Please see separate dictation for contemporaneously obtained CTA  chest, abdomen and pelvis dated 08/06/2017 for full description of  relevant extracardiac findings.  Electronically Signed:  By: Vinnie Langton M.D.  On: 08/06/2017 13:45   CLINICAL DATA: 84 year old male with history of severe aortic  stenosis. Preprocedural study prior to potential transcatheter   aortic valve replacement (TAVR).  EXAM:  CT ANGIOGRAPHY CHEST, ABDOMEN AND PELVIS  TECHNIQUE:  Multidetector CT imaging through the chest, abdomen and pelvis was  performed using the standard protocol during bolus administration of  intravenous contrast. Multiplanar reconstructed images and MIPs were  obtained and reviewed to evaluate the vascular anatomy.  CONTRAST: 126mL ISOVUE-370 IOPAMIDOL (ISOVUE-370) INJECTION 76%  COMPARISON: No priors  FINDINGS:  CTA CHEST FINDINGS  Cardiovascular: Heart size is enlarged with severe left atrial  dilatation. There is no significant pericardial fluid, thickening or  pericardial calcification. There is aortic atherosclerosis, as well  as atherosclerosis of the great vessels of the mediastinum and the  coronary arteries, including calcified atherosclerotic plaque in the  left main, left anterior descending, left circumflex and right  coronary arteries. Large area of myocardial thinning involving the  lateral, inferolateral and inferior wall segments in the mid left  ventricle, indicative of fibrofatty metaplasia and scarring from  prior circumflex territory myocardial infarction(s). Severe  thickening calcification of the aortic valve. Thickening  calcification of the mitral valve and mitral annulus.  Mediastinum/Lymph Nodes: No pathologically enlarged mediastinal or  hilar lymph nodes. Esophagus is unremarkable in appearance. No  axillary lymphadenopathy.  Lungs/Pleura: Moderate bilateral pleural effusions lying  dependently. No acute consolidative airspace disease. No suspicious  appearing pulmonary nodules or masses. Linear scarring in the left  upper lobe.  Musculoskeletal/Soft Tissues: There are no aggressive appearing  lytic or blastic lesions noted in the visualized portions of the  skeleton.  CTA ABDOMEN AND PELVIS FINDINGS  Hepatobiliary: No suspicious cystic or solid hepatic lesions. No  intra or extrahepatic biliary ductal  dilatation. Gallbladder is  normal in appearance.  Pancreas: No pancreatic mass. No pancreatic ductal dilatation. No  pancreatic or peripancreatic fluid or inflammatory changes.  Spleen: Unremarkable.  Adrenals/Urinary Tract: 1.6 cm exophytic simple cyst extending off  the lateral aspect of the upper pole of the right kidney. 1.6 cm  partially exophytic simple cyst in the interpolar region of the left  kidney laterally. No suspicious renal lesions. Bilateral adrenal  glands are normal in appearance. No hydroureteronephrosis. 3.9 cm  left-sided bladder diverticulum with some mild irregularity in the  dependent portion of the diverticulum (axial image 191 of series  15). Small amount of contrast material lying dependently in the  urinary bladder, presumably related to test injection.  Stomach/Bowel: Normal appearance of the stomach. No pathologic  dilatation of small bowel or colon. Normal appendix.  Vascular/Lymphatic: Aortic atherosclerosis, with vascular findings  and measurements pertinent to potential TAVR procedure, as detailed  below. No aneurysm or dissection noted in the abdominal or pelvic  vasculature. Celiac axis, superior mesenteric artery and inferior  mesenteric artery and their major branches are widely patent without  hemodynamically significant stenosis. Single renal arteries are  widely patent bilaterally. No lymphadenopathy noted in the abdomen  or pelvis.  Reproductive: Prostate gland and seminal vesicles are unremarkable  in appearance.  Other: No significant volume of ascites. No pneumoperitoneum.  Musculoskeletal: Chronic appearing compression fracture of L3 with  approximately 20% loss of anterior vertebral body height. There are  no aggressive appearing lytic or blastic lesions noted in the  visualized portions of the skeleton.  VASCULAR MEASUREMENTS PERTINENT TO TAVR:  AORTA:  Minimal Aortic Diameter-16 x 16 mm  Severity of Aortic Calcification-severe   RIGHT PELVIS:  Right Common Iliac Artery -  Minimal Diameter-10.1 x 10.5 mm  Tortuosity-mild  Calcification-moderate  Right External Iliac Artery -  Minimal Diameter-8.9 x 8.9 mm  Tortuosity-severe  Calcification-mild  Right Common Femoral Artery -  Minimal Diameter-9.4 x 10.4 mm  Tortuosity-mild  Calcification-mild  LEFT PELVIS:  Left Common Iliac Artery -  Minimal Diameter-11.1 x 10.0 mm  Tortuosity-mild  Calcification-moderate  Left External Iliac Artery -  Minimal Diameter-8.6 x 7.7 mm  Tortuosity-moderate  Calcification-mild  Left Common Femoral Artery -  Minimal Diameter-8.7 x 8.3 mm  Tortuosity-mild  Calcification-mild  Review of the MIP images confirms the above findings.  IMPRESSION:  1. Vascular findings and measurements pertinent to potential TAVR  procedure, as detailed above.  2. Severe thickening calcification of the aortic valve, compatible  with the reported clinical history of severe aortic stenosis.  3. Aortic atherosclerosis, in addition to left main and 3 vessel  coronary artery disease. Evidence of prior left circumflex territory  myocardial infarction(s), as above.  4. Severe thickening calcification of the mitral valve and mitral  annulus.  5. Cardiomegaly with severe left atrial dilatation.  6. Moderate bilateral pleural effusions lying dependently are simple  in appearance.  7. Large left-sided bladder wall diverticulum with some mild  irregularity in the dependent portion of the diverticulum. This  could simply represent some retained debris, however, nonemergent  Urologic consultation is suggested, particularly if there is any  history of hematuria as a urothelial neoplasm in this region is not  entirely excluded.  8. Additional incidental findings, as above.  Aortic Atherosclerosis (ICD10-I70.0).  Electronically Signed  By: Vinnie Langton M.D.  On: 08/06/2017 15:01   STS Risk Calculator  Procedure: Isolated AVR CALCULATE  Risk of  Mortality:  3.237%   Renal Failure:  1.824%   Permanent Stroke:  1.582%   Prolonged Ventilation:  10.243%   DSW Infection:  0.104%   Reoperation:  5.634%   Morbidity or Mortality:  20.367%   Short Length of Stay:  17.255%   Long Length of Stay:  10.626%    Procedure: Isolated MVR CALCULATE  Risk of Mortality:  5.516%   Renal Failure:  1.819%   Permanent Stroke:  3.424%   Prolonged Ventilation:  12.220%   DSW Infection:  0.118%   Reoperation:  7.666%   Morbidity or Mortality:  19.447%   Short Length of Stay:  14.468%   Long Length of Stay:  12.244%     Impression:  This 84 year old gentleman has stage D, severe, symptomatic aortic stenosis with New York Heart Association class III symptoms of progressive exertional fatigue and shortness of breath, orthopnea, and lower extremity edema consistent with chronic diastolic heart failure.  He was admitted in December 2018 with urinary retention and class IV heart failure symptoms with volume overload.  He has limited mobility due to his prior stroke in 2007 as well as severe degenerative arthritis but according to his family he has remained fairly active until the past couple months when he has developed progressive congestive heart failure symptoms.  I have personally reviewed his recent transthoracic and transesophageal echocardiograms, cardiac catheterization, and CTA studies.  His aortic valve is trileaflet with severe thickening, calcification, and restricted leaflet mobility with a mean gradient by TEE of 55 mmHg consistent with severe aortic stenosis.  He also has severe primary mitral regurgitation and mild mitral stenosis.  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  Cardiac catheterization shows mild nonobstructive disease with mild pulmonary hypertension.  I do not think he would be a candidate for open surgical aortic valve replacement and mitral valve repair due to the high risk at 84 years old with limited mobility and a  frailty rating of 2/12.  I think it would be worthwhile performing transcatheter aortic valve replacement.  While this may not reduce the amount of mitral regurgitation it may improve his symptoms and functional status.  He has not felt to be a candidate for Mitraclip due to the relatively small size of his valve, limited surface area of the posterior leaflet, and pre-existing gradient across the mitral valve with mild mitral stenosis.  His gated cardiac CTA shows favorable anatomy for transcatheter aortic valve replacement using a Sapien 3 valve.  He has some focal basal septal hypertrophy but I do not think this would cause a problem for valve implantation.  His abdominal and pelvic CTA shows suitable pelvic arterial anatomy for transfemoral insertion.  The patient and his wife and daughter were counseled at length regarding treatment alternatives for management of severe symptomatic aortic stenosis. The risks and benefits of surgical intervention have been discussed in detail. Long-term prognosis with medical therapy was discussed. Alternative approaches such as conventional surgical aortic valve replacement, transcatheter aortic valve replacement, and palliative medical therapy were compared and contrasted at length. This discussion was placed in the context of the patient's own specific clinical presentation and past medical history. All of their questions been addressed.   Following the decision to proceed with transcatheter aortic valve replacement, a discussion was held regarding what types of management strategies would be attempted intraoperatively in the event of life-threatening complications, including whether or not the patient would be considered a candidate for the use of cardiopulmonary bypass and/or conversion to open sternotomy for attempted surgical intervention.  I do not think he is a candidate for open sternotomy under any circumstances.  The patient has been advised of a variety of  complications that might develop including but not limited to risks of death, stroke, paravalvular leak, aortic dissection or other major vascular complications, aortic annulus rupture, device embolization, cardiac rupture or perforation, mitral regurgitation, acute myocardial infarction, arrhythmia, heart block or bradycardia requiring permanent pacemaker placement, congestive heart failure, respiratory failure, renal failure, pneumonia, infection, other late complications related to structural valve deterioration or migration, or other complications that might ultimately cause a temporary or permanent loss of functional independence or other long term morbidity. The patient provides full informed consent for the procedure as described and all questions were answered.    Plan:  Transfemoral transcatheter aortic valve replacement on Tuesday, 09/04/2017.   I spent 60 minutes performing this consultation and > 50% of this time was spent face to face counseling and coordinating the care of this patient's severe aortic valve stenosis.    Gaye Pollack, MD 08/29/2017

## 2017-08-31 ENCOUNTER — Ambulatory Visit (HOSPITAL_COMMUNITY)
Admission: RE | Admit: 2017-08-31 | Discharge: 2017-08-31 | Disposition: A | Discharge status: Home / Self Care | Payer: Medicare HMO | Source: Ambulatory Visit | Attending: Cardiovascular Disease | Admitting: Cardiovascular Disease

## 2017-08-31 ENCOUNTER — Encounter (HOSPITAL_COMMUNITY): Payer: Self-pay

## 2017-08-31 ENCOUNTER — Encounter (HOSPITAL_COMMUNITY)
Admission: RE | Admit: 2017-08-31 | Discharge: 2017-08-31 | Disposition: A | Discharge status: Home / Self Care | Payer: Medicare HMO | Source: Ambulatory Visit | Attending: Cardiovascular Disease | Admitting: Cardiovascular Disease

## 2017-08-31 ENCOUNTER — Other Ambulatory Visit: Payer: Self-pay

## 2017-08-31 DIAGNOSIS — I252 Old myocardial infarction: Secondary | ICD-10-CM | POA: Insufficient documentation

## 2017-08-31 DIAGNOSIS — I509 Heart failure, unspecified: Secondary | ICD-10-CM | POA: Diagnosis not present

## 2017-08-31 DIAGNOSIS — D649 Anemia, unspecified: Secondary | ICD-10-CM | POA: Diagnosis not present

## 2017-08-31 DIAGNOSIS — I700 Atherosclerosis of aorta: Secondary | ICD-10-CM | POA: Insufficient documentation

## 2017-08-31 DIAGNOSIS — I110 Hypertensive heart disease with heart failure: Secondary | ICD-10-CM | POA: Diagnosis not present

## 2017-08-31 DIAGNOSIS — R9431 Abnormal electrocardiogram [ECG] [EKG]: Secondary | ICD-10-CM | POA: Insufficient documentation

## 2017-08-31 DIAGNOSIS — Z01818 Encounter for other preprocedural examination: Secondary | ICD-10-CM | POA: Diagnosis not present

## 2017-08-31 DIAGNOSIS — Z01810 Encounter for preprocedural cardiovascular examination: Secondary | ICD-10-CM | POA: Insufficient documentation

## 2017-08-31 DIAGNOSIS — I350 Nonrheumatic aortic (valve) stenosis: Secondary | ICD-10-CM

## 2017-08-31 DIAGNOSIS — I493 Ventricular premature depolarization: Secondary | ICD-10-CM | POA: Insufficient documentation

## 2017-08-31 DIAGNOSIS — J849 Interstitial pulmonary disease, unspecified: Secondary | ICD-10-CM | POA: Diagnosis not present

## 2017-08-31 DIAGNOSIS — I2510 Atherosclerotic heart disease of native coronary artery without angina pectoris: Secondary | ICD-10-CM | POA: Diagnosis not present

## 2017-08-31 DIAGNOSIS — I4891 Unspecified atrial fibrillation: Secondary | ICD-10-CM | POA: Diagnosis not present

## 2017-08-31 DIAGNOSIS — Z01812 Encounter for preprocedural laboratory examination: Secondary | ICD-10-CM | POA: Diagnosis not present

## 2017-08-31 LAB — TYPE AND SCREEN
ABO/RH(D): A POS
Antibody Screen: NEGATIVE

## 2017-08-31 LAB — BLOOD GAS, ARTERIAL
Acid-Base Excess: 3.9 mmol/L — ABNORMAL HIGH (ref 0.0–2.0)
Bicarbonate: 27.6 mmol/L (ref 20.0–28.0)
DRAWN BY: 421801
FIO2: 21
O2 Saturation: 98.2 %
PCO2 ART: 39.4 mmHg (ref 32.0–48.0)
PH ART: 7.46 — AB (ref 7.350–7.450)
Patient temperature: 98.6
pO2, Arterial: 108 mmHg (ref 83.0–108.0)

## 2017-08-31 LAB — BRAIN NATRIURETIC PEPTIDE: B Natriuretic Peptide: 343.3 pg/mL — ABNORMAL HIGH (ref 0.0–100.0)

## 2017-08-31 LAB — URINALYSIS, ROUTINE W REFLEX MICROSCOPIC
Bilirubin Urine: NEGATIVE
Glucose, UA: NEGATIVE mg/dL
KETONES UR: NEGATIVE mg/dL
Nitrite: NEGATIVE
PROTEIN: NEGATIVE mg/dL
Specific Gravity, Urine: 1.011 (ref 1.005–1.030)
Squamous Epithelial / LPF: NONE SEEN
pH: 5 (ref 5.0–8.0)

## 2017-08-31 LAB — PROTIME-INR
INR: 2.04
Prothrombin Time: 22.9 seconds — ABNORMAL HIGH (ref 11.4–15.2)

## 2017-08-31 LAB — COMPREHENSIVE METABOLIC PANEL
ALK PHOS: 52 U/L (ref 38–126)
ALT: 11 U/L — AB (ref 17–63)
AST: 20 U/L (ref 15–41)
Albumin: 3.5 g/dL (ref 3.5–5.0)
Anion gap: 9 (ref 5–15)
BILIRUBIN TOTAL: 1 mg/dL (ref 0.3–1.2)
BUN: 15 mg/dL (ref 6–20)
CO2: 24 mmol/L (ref 22–32)
CREATININE: 0.86 mg/dL (ref 0.61–1.24)
Calcium: 8.7 mg/dL — ABNORMAL LOW (ref 8.9–10.3)
Chloride: 100 mmol/L — ABNORMAL LOW (ref 101–111)
GFR calc Af Amer: >60 mL/min (ref >60)
Glucose, Bld: 93 mg/dL (ref 65–99)
Potassium: 4.1 mmol/L (ref 3.5–5.1)
Sodium: 133 mmol/L — ABNORMAL LOW (ref 135–145)
TOTAL PROTEIN: 6.8 g/dL (ref 6.5–8.1)

## 2017-08-31 LAB — CBC
HEMATOCRIT: 32.3 % — AB (ref 39.0–52.0)
Hemoglobin: 10.5 g/dL — ABNORMAL LOW (ref 13.0–17.0)
MCH: 29.3 pg (ref 26.0–34.0)
MCHC: 32.5 g/dL (ref 30.0–36.0)
MCV: 90.2 fL (ref 78.0–100.0)
Platelets: 195 10*3/uL (ref 150–400)
RBC: 3.58 MIL/uL — AB (ref 4.22–5.81)
RDW: 17.1 % — ABNORMAL HIGH (ref 11.5–15.5)
WBC: 6.8 10*3/uL (ref 4.0–10.5)

## 2017-08-31 LAB — ABO/RH: ABO/RH(D): A POS

## 2017-08-31 LAB — APTT: aPTT: 44 seconds — ABNORMAL HIGH (ref 24–36)

## 2017-08-31 LAB — SURGICAL PCR SCREEN
MRSA, PCR: NEGATIVE
Staphylococcus aureus: NEGATIVE

## 2017-08-31 LAB — HEMOGLOBIN A1C
HEMOGLOBIN A1C: 5.1 % (ref 4.8–5.6)
Mean Plasma Glucose: 99.67 mg/dL

## 2017-08-31 MED ORDER — CHLORHEXIDINE GLUCONATE 4 % EX LIQD: 30.0000 mL | CUTANEOUS | Status: DC

## 2017-08-31 MED ORDER — CHLORHEXIDINE GLUCONATE 4 % EX LIQD: 60.0000 mL | Freq: Once | CUTANEOUS | Status: DC

## 2017-08-31 NOTE — Progress Notes (Addendum)
KKD:PTELMRA Opalski, MD  Cardiologist: Kirk Ruths, MD  EKG:07/10/17 and obtained today per orders-in EPIC  Stress test: yes, many years ago  ECHO: most recent 07/30/17 in EPIC   Cardiac Cath: 08/01/17 in EPIC  Chest x-ray: 07/02/17 and obtained today per orders-in Epic  Patient was instructed and has stopped coumadin as of 08/29/17  Pt's EKG shows Atrial Fibrillation-he has know chronic Atrial Fibrillation.  Informed Willeen Cass NP of EKG and she is aware it is a chronic

## 2017-08-31 NOTE — Pre-Procedure Instructions (Signed)
[name]  08/31/2017      [place] ([place]), [place] - [address] [place] [address] Phone: [phone] Fax: [phone]    Your procedure is scheduled on September 04, 2017.  Report to [facility] Admitting at 800 AM.  Call this number if you have problems the morning of surgery:  [phone]   Remember:  Do not eat food or drink liquids after midnight.  Take these medicines the morning of surgery with A SIP OF WATER (none).   Stop/resume coumadin as instructed by you surgeon (last dose 08/29/17 per instructions)  Beginning now stop Aleve, Naproxen, Ibuprofen, Motrin, Advil, Goody's, BC's, all herbal medications, fish oil, and all vitamins  Continue all other medications as instructed by your physician except follow the above medication instructions before surgery  Do not wear jewelry, make-up or nail polish.  Do not wear lotions, powders, or perfumes, or deodorant.  Do not shave 48 hours prior to surgery.  Men may shave face and neck.  Do not bring valuables to the hospital.  [facility] is not responsible for any belongings or valuables.  Contacts, dentures or bridgework may not be worn into surgery.  Leave your suitcase in the car.  After surgery it may be brought to your room.  For patients admitted to the hospital, discharge time will be determined by your treatment team.  Patients discharged the day of surgery will not be allowed to drive home.   [place]- Preparing For Surgery  Before surgery, you can play an important role. Because skin is not sterile, your skin needs to be as free of germs as possible. You can reduce the number of germs on your skin by washing with CHG (chlorahexidine gluconate) Soap before surgery.  CHG is an antiseptic cleaner which kills germs and bonds with the skin to continue killing germs even after washing.  Please do not use if you have an allergy to CHG or  antibacterial soaps. If your skin becomes reddened/irritated stop using the CHG.  Do not shave (including legs and underarms) for at least 48 hours prior to first CHG shower. It is OK to shave your face.  Please follow these instructions carefully.   1. Shower the NIGHT BEFORE SURGERY and the MORNING OF SURGERY with CHG.   2. If you chose to wash your hair, wash your hair first as usual with your normal shampoo.  3. After you shampoo, rinse your hair and body thoroughly to remove the shampoo.  4. Use CHG as you would any other liquid soap. You can apply CHG directly to the skin and wash gently with a scrungie or a clean washcloth.   5. Apply the CHG Soap to your body ONLY FROM THE NECK DOWN.  Do not use on open wounds or open sores. Avoid contact with your eyes, ears, mouth and genitals (private parts). Wash Face and genitals (private parts)  with your normal soap.  6. Wash thoroughly, paying special attention to the area where your surgery will be performed.  7. Thoroughly rinse your body with warm water from the neck down.  8. DO NOT shower/wash with your normal soap after using and rinsing off the CHG Soap.  9. Pat yourself dry with a CLEAN TOWEL.  10. Wear CLEAN PAJAMAS to bed the night before surgery, wear comfortable clothes the morning of surgery  11. Place CLEAN SHEETS on your bed the night of  your first shower and DO NOT SLEEP WITH PETS.  Day of Surgery: Do not apply any deodorants/lotions. Please wear clean clothes to the hospital/surgery center.     Please read over the following fact sheets that you were given. Pain Booklet, Coughing and Deep Breathing, MRSA Information and Surgical Site Infection Prevention

## 2017-09-03 MED ORDER — MAGNESIUM SULFATE 50 % IJ SOLN
40.0000 meq | INTRAMUSCULAR | Status: DC
  Filled 2017-09-03: qty 9.85

## 2017-09-03 MED ORDER — POTASSIUM CHLORIDE 2 MEQ/ML IV SOLN
80.0000 meq | INTRAVENOUS | Status: DC
  Filled 2017-09-03: qty 40

## 2017-09-03 MED ORDER — NOREPINEPHRINE BITARTRATE 1 MG/ML IV SOLN
0.0000 ug/min | INTRAVENOUS | Status: DC
  Filled 2017-09-03: qty 4

## 2017-09-03 MED ORDER — VANCOMYCIN HCL 10 G IV SOLR
1250.0000 mg | INTRAVENOUS | Status: AC
  Administered 2017-09-04: 1250 mg via INTRAVENOUS
  Filled 2017-09-03: qty 1250

## 2017-09-03 MED ORDER — SODIUM CHLORIDE 0.9 % IV SOLN
INTRAVENOUS | Status: DC
  Filled 2017-09-03: qty 1

## 2017-09-03 MED ORDER — SODIUM CHLORIDE 0.9 % IV SOLN
INTRAVENOUS | Status: DC
  Filled 2017-09-03: qty 30

## 2017-09-03 MED ORDER — SODIUM CHLORIDE 0.9 % IV SOLN
30.0000 ug/min | INTRAVENOUS | Status: AC
  Administered 2017-09-04: 20 ug/min via INTRAVENOUS
  Filled 2017-09-03: qty 2

## 2017-09-03 MED ORDER — NITROGLYCERIN IN D5W 200-5 MCG/ML-% IV SOLN
2.0000 ug/min | INTRAVENOUS | Status: DC
  Filled 2017-09-03: qty 250

## 2017-09-03 MED ORDER — DOPAMINE-DEXTROSE 3.2-5 MG/ML-% IV SOLN
0.0000 ug/kg/min | INTRAVENOUS | Status: DC
Start: 2017-09-04 — End: 2017-09-04
  Filled 2017-09-03: qty 250

## 2017-09-03 MED ORDER — EPINEPHRINE PF 1 MG/ML IJ SOLN
0.0000 ug/min | INTRAVENOUS | Status: DC
  Filled 2017-09-03: qty 4

## 2017-09-03 MED ORDER — DEXMEDETOMIDINE HCL IN NACL 400 MCG/100ML IV SOLN
0.1000 ug/kg/h | INTRAVENOUS | Status: AC
  Administered 2017-09-04: .5 ug/kg/h via INTRAVENOUS
  Filled 2017-09-03: qty 100

## 2017-09-03 MED ORDER — SODIUM CHLORIDE 0.9 % IV SOLN
1.5000 g | INTRAVENOUS | Status: AC
  Administered 2017-09-04: 1.5 g via INTRAVENOUS
  Filled 2017-09-03: qty 1.5

## 2017-09-03 NOTE — Anesthesia Preprocedure Evaluation (Signed)
Anesthesia Evaluation  Patient identified by MRN, date of birth, ID band Patient awake    Reviewed: Allergy & Precautions, NPO status , Patient's Chart, lab work & pertinent test results, reviewed documented beta blocker date and time   Airway Mallampati: II  TM Distance: >3 FB Neck ROM: Full    Dental   Pulmonary neg pulmonary ROS,    Pulmonary exam normal breath sounds clear to auscultation       Cardiovascular hypertension, Pt. on medications and Pt. on home beta blockers + CAD, + Past MI, + Peripheral Vascular Disease and +CHF  Normal cardiovascular exam+ dysrhythmias Atrial Fibrillation + Valvular Problems/Murmurs AS and MR  Rhythm:Irregular Rate:Normal + Systolic murmurs '19 TEE - LVH. EF 55% to 60%. Severe AS (From '18 TTE - Valve area (VTI): 0.49 cm^2. Valve area (Vmax): 0.47 cm^2. Valve area (Vmean): 0.53 Cm^2.) . MVP involving the anterior leaflet with severe MR directed eccentrically. Left atrium was severely dilated. Right atrium was moderately dilated. Mild tricuspid prolapse (From '18 TTE mild-mod TR). A small pericardial effusion was identified.  '19 Cath - 1. Calcified coronary arteries with mild nonobstructive CAD 2. Moderate pulmonary HTN with mPAP 37 mmHg 3. Large V waves 41 mmHg consistent with severe mitral regurgitation  '19 Carotid US - b/l ICAS 1-39%    Neuro/Psych CVA negative psych ROS   GI/Hepatic negative GI ROS, Neg liver ROS,   Endo/Other  negative endocrine ROS  Renal/GU negative Renal ROS  negative genitourinary   Musculoskeletal  (+) Arthritis ,   Abdominal   Peds  Hematology  (+) anemia ,   Anesthesia Other Findings   Reproductive/Obstetrics                             Anesthesia Physical Anesthesia Plan  ASA: IV  Anesthesia Plan: MAC   Post-op Pain Management:    Induction: Intravenous  PONV Risk Score and Plan: Treatment may vary due to age or  medical condition and Ondansetron  Airway Management Planned: Natural Airway and Simple Face Mask  Additional Equipment: CVP, Arterial line and Ultrasound Guidance Line Placement  Intra-op Plan:   Post-operative Plan:   Informed Consent: I have reviewed the patients History and Physical, chart, labs and discussed the procedure including the risks, benefits and alternatives for the proposed anesthesia with the patient or authorized representative who has indicated his/her understanding and acceptance.     Plan Discussed with: CRNA and Anesthesiologist  Anesthesia Plan Comments:         Anesthesia Quick Evaluation

## 2017-09-03 NOTE — H&P (Signed)
[address]       [place] [address]             [phone]      Cardiothoracic Surgery Admission History and Physical   Referring Provider is Crenshaw, Denice Bors, MD  PCP is [name], DO      Chief Complaint  Patient presents with  . Aortic Stenosis       HPI:   This patient is an 84 year old gentleman with a history of hypertension, coronary artery disease status post remote myocardial infarction and PCI of a distal OM branch with no stenting in 2007, permanent atrial fibrillation on long-term anticoagulation with Coumadin, previous embolic stroke, BPH with bladder outlet obstruction, severe degenerative arthritis with chronic back and leg pain, mitral regurgitation and mitral stenosis, and aortic stenosis. In December 2018 he was hospitalized with urinary retention secondary to bladder outlet obstruction and had an acute exacerbation of chronic diastolic heart failure with shortness of breath at rest and severe lower extremity edema. An echocardiogram on 05/21/2017 showed a trileaflet aortic valve that was severely thickened and calcified with restricted mobility. The mean gradient was 44 mmHg with a peak gradient of 80 mmHg. The mitral valve had a severely calcified annulus with restricted mobility of the leaflets and a flail segment of the anterior leaflet. There was severe regurgitation directed eccentrically and posteriorly. The mean gradient across the mitral valve was 11 mmHg with a peak gradient of 19 mmHg. Left atrium was severely dilated. Left ventricular systolic function was normal. There was severe focal basal hypertrophy of the septum. He improved and was sent home. A TEE on 07/20/2017 showed a left ventricular ejection fraction of 55-60% with some hypokinesis of the inferolateral wall. There was severe aortic stenosis with a mean transvalvular gradient of 56 mmHg. There is severe mitral regurgitation with what appeared to be a ruptured chordae to the  anterior leaflet. There was severe left atrial enlargement with no thrombus present. He was referred to the multidisciplinary heart valve clinic and was evaluated by Dr. Burt Knack. He underwent catheterization on August 01, 2017 which showed mild nonobstructive coronary disease. There is moderate pulmonary hypertension with a mean PA pressure of 37 mmHg and large V waves on the wedge tracing consistent with severe mitral regurgitation.  The patient is married and lives in [place] with his wife. He is here today with his wife and daughter. They report that he has had reduced mobility since his stroke in 2007 but has remained reasonably active until the past several months when he has had a progressive downhill course with shortness of breath with any activity, fatigue, orthopnea, and lower extremity edema. He denies any chest pain or pressure. He has had some dizziness when he bends over. His wife and daughter feel like he has gotten significantly worse over the past couple weeks.      Past Medical History:  Diagnosis Date  . Aortic stenosis   . Arthritis   . Atrial fibrillation (HCC)    Chronic  . Atrial fibrillation, chronic ([place])   . BPH (benign prostatic hyperplasia)   . CAD (coronary artery disease)   . Chronic anticoagulation   . History of chicken pox   . HTN (hypertension)   . Mitral stenosis   . Old MI (myocardial infarction) 2007   s/p PCI to distal OM (no stent)  . Severe mitral regurgitation   . Stroke, embolic ([place])  Past Surgical History:  Procedure Laterality Date  . CORONARY ANGIOPLASTY  2007   Distal OM  . PROSTATE BIOPSY    . RIGHT/LEFT HEART CATH AND CORONARY ANGIOGRAPHY N/A 08/01/2017   Procedure: RIGHT/LEFT HEART CATH AND CORONARY ANGIOGRAPHY; Surgeon: Sherren Mocha, MD; Location: [facility]; Service: Cardiovascular; Laterality: N/A;  . TEE WITHOUT CARDIOVERSION N/A 07/20/2017   Procedure: TRANSESOPHAGEAL ECHOCARDIOGRAM (TEE); Surgeon: Lelon Perla, MD; Location[facility] END[facility]COPY; Service: Cardiovascular; Laterality: N/A;        Family History  Problem Relation Age of Onset  . Heart disease Mother   . Heart attack Mother   . Heart disease Father   . Heart attack Father   . Heart attack Brother   . Heart attack Sister   . Stroke Brother    Social History        Socioeconomic History  . Marital status: Married    Spouse name: Not on file  . Number of children: 3  . Years of education: 36  . Highest education level: Not on file  Social Needs  . Financial resource strain: Not on file  . Food insecurity - worry: Not on file  . Food insecurity - inability: Not on file  . Transportation needs - medical: Not on file  . Transportation needs - non-medical: Not on file  Occupational History  . Not on file  Tobacco Use  . Smoking status: Never Smoker  . Smokeless tobacco: Never Used  Substance and Sexual Activity  . Alcohol use: No  . Drug use: No  . Sexual activity: Not on file  Other Topics Concern  . Not on file  Social History Narrative   Fun: Garden, work in the yard, anything physical and walk daily         Current Outpatient Medications  Medication Sig Dispense Refill  . acetaminophen (TYLENOL) 500 MG tablet Take 1,000 mg by mouth every 6 (six) hours as needed for moderate pain or headache.    . alfuzosin (UROXATRAL) 10 MG 24 hr tablet Take 10 mg by mouth daily with breakfast.    . ARTIFICIAL TEAR OP Place 1 drop into both eyes daily as needed (dry eyes).    . calcium carbonate (TUMS - DOSED IN MG ELEMENTAL CALCIUM) 500 MG chewable tablet Chew 1-2 tablets by mouth daily as needed for indigestion or heartburn.    . cholecalciferol 2000 units TABS Take 1 tablet (2,000 Units total) by mouth daily. 30 tablet 0  . Dextromethorphan-Guaifenesin (MUCINEX DM) 30-600 MG TB12 Take 1-2 tablets by mouth every 12 (twelve) hours as needed (for cough/congestion.).    Marland Kitchen digoxin (LANOXIN) 0.125 MG tablet TAKE ONE TABLET BY MOUTH ONCE  DAILY (Patient taking differently: TAKE ONE TABLET (159mcg) BY MOUTH ONCE DAILY) 90 tablet 3  . finasteride (PROSCAR) 5 MG tablet Take 5 mg by mouth at bedtime.     . furosemide (LASIX) 20 MG tablet Take 80 mg by mouth daily.    . Melatonin 1 MG TABS Take 2 mg by mouth at bedtime.     . Menthol, Topical Analgesic, (BIOFREEZE EX) Apply 1 application topically 3 (three) times daily as needed (muscle pain in back). alterrnates between [employer] and Blu Emu    . Menthol, Topical Analgesic, (BLUE-EMU MAXIMUM STRENGTH EX) Apply 1 application topically 3 (three) times daily as needed (muscle pain in back). alterrnates between [employer] and [employer]mu    . methocarbamol (ROBAXIN) 500 MG tablet Take 500 mg by mouth  daily as needed (cramps).     . metoprolol tartrate (LOPRESSOR) 25 MG tablet TAKE ONE TABLET (25mg ) BY MOUTH TWICE DAILY. (Patient taking differently: Take 25 mg by mouth 2 (two) times daily. ) 135 tablet 3  . polyethylene glycol (MIRALAX / GLYCOLAX) packet Take 17 g by mouth daily.     . potassium chloride (K-DUR) 10 MEQ tablet Take 1 tablet (10 mEq total) by mouth daily as needed. Take potasium if you take lasix. (Patient taking differently: Take 10 mEq by mouth See admin instructions. Take 1 tablet (10 meq) twice daily for 3 days (beginning 08/24/2017) then return to 1 tablet (10 meq) daily in the morning) 30 tablet 2  . sodium chloride (OCEAN) 0.65 % SOLN nasal spray Place 1 spray into both nostrils daily as needed for congestion.    . triamcinolone cream (KENALOG) 0.1 % Apply 1 application topically daily as needed (dry skin).     Marland Kitchen warfarin (COUMADIN) 2.5 MG tablet TAKE 1 TO 2 TABLETS BY MOUTH ONCE DAILY AS DIRECTED BY COUMADIN CLINIC (Patient taking differently: Take 2.5-5 mg by mouth See admin instructions. TAKE 1 TABLET (2.5 MG) BY MOUTH ON TUESDAYS AND SATURDAYS  TAKE 2 TABLETS (5 MG) BY MOUTH ON SUNDAY, MONDAY, WEDNESDAYS, THURSDAYS, AND FRIDAYS.) 180 tablet 0   No current  facility-administered medications for this visit.         Allergies  Allergen Reactions  . Prednisone Other (See Comments)    Wheezing  . Procaine Hcl Other (See Comments)    Passed out after being given this at dental appointment  . Levofloxacin Rash  . Oxycodone Other (See Comments)    constipation  . Penicillins Rash    Has patient had a PCN reaction causing immediate rash, facial/tongue/throat swelling, SOB or lightheadedness with hypotension: Yes  Has patient had a PCN reaction causing severe rash involving mucus membranes or skin necrosis: No  Has patient had a PCN reaction that required hospitalization No  Has patient had a PCN reaction occurring within the last 10 years: No  If all of the above answers are "NO", then may proceed with Cephalosporin use.   Review of Systems:  General: normal appetite, decreased energy, no weight gain, no weight loss, no fever  Cardiac: no chest pain with exertion, no chest pain at rest, + SOB with mild exertion, occasional resting SOB, no PND, + orthopnea, no palpitations, + arrhythmia, + atrial fibrillation, + LE edema, + dizzy spells, no syncope  Respiratory: + shortness of breath, no home oxygen, + productive cough, no dry cough, no bronchitis, no wheezing, no hemoptysis, no asthma, no pain with inspiration or cough, no sleep apnea, no CPAP at night  GI: no difficulty swallowing, no reflux, no frequent heartburn, no hiatal hernia, no abdominal pain, + constipation, no diarrhea, no hematochezia, no hematemesis, no melena  GU: no dysuria, + frequency, no urinary tract infection, no hematuria, + enlarged prostate, no kidney stones, no kidney disease  Vascular: + pain suggestive of claudication, no pain in feet, + leg cramps, no varicose veins, no DVT, no non-healing foot ulcer  Neuro: + stroke, no TIA's, no seizures, no headaches, no temporary blindness one eye, noo slurred speech, no peripheral neuropathy, + chronic pain, + instability of gait, no  memory/cognitive dysfunction  Musculoskeletal: + arthritis, no joint swelling, no myalgias, + difficulty walking, limited mobility  Skin: no rash, no itching, no skin infections, no pressure sores or ulcerations  Psych: no anxiety, no depression, no nervousness, no  unusual recent stress  Eyes: + blurry vision, no floaters, no recent vision changes, not wear glasses  ENT: + hearing loss, no loose or painful teeth, no dentures, last saw dentist last month  Hematologic: + easy bruising, no abnormal bleeding, no clotting disorder, no frequent epistaxis  Endocrine: no diabetes, does not check CBG's at home    Physical Exam:   BP 102/69 (BP Location: Left Arm, Patient Position: Sitting, Cuff Size: Normal)  Pulse 77  Resp 18  Ht 5' 10.5" (1.791 m)  Wt 172 lb (78 kg)  SpO2 97% Comment: RA  BMI 24.33 kg/m  General: Elderly and frail-appearing  HEENT: Unremarkable , NCAT, PERLA, EOMI, oropharynx clear  Neck: no JVD, no bruits, no adenopathy or thyromegaly  Chest: clear to auscultation, symmetrical breath sounds, no wheezes, no rhonchi  CV: RRR, grade IV/VI crescendo/decrescendo murmur heard best at RSB, no diastolic murmur  Abdomen: soft, non-tender, no masses or organomegaly  Extremities: warm, well-perfused, pulses diminished in feet, mild bilalateral LE edema  Rectal/GU Deferred  Neuro: Grossly non-focal and symmetrical throughout  Skin: Clean and dry, no rashes, no breakdown    Diagnostic Tests:   *[place]*  *[facility]*  [address], [place] [address]  [phone]  -------------------------------------------------------------------  Transesophageal Echocardiography  Patient: Sumedh, Shinsato  MR #: [id number]  Study Date: 07/20/2017  Gender: M  Age: 64  Height: 180.3 cm  Weight: 80.7 kg  BSA: 2.02 m^2  Pt. Status:  Room:  ADMITTING Kirk Ruths  ATTENDING Kirk Ruths  ORDERING Kirk Ruths  PERFORMING Kirk Ruths    REFERRING Kirk Ruths  SONOGRAPHER Johny Chess, RDCS, CCT  cc:  -------------------------------------------------------------------  LV EF: 55% - 60%  -------------------------------------------------------------------  Indications: Aortic stenosis 424.1.  -------------------------------------------------------------------  Study Conclusions  - Left ventricle: Hypertrophy was noted. Systolic function was  normal. The estimated ejection fraction was in the range of 55%  to 60%.  - Aortic valve: Cusp separation was severely reduced. There was  severe stenosis.  - Mitral valve: Mildly calcified annulus. Prolapse, involving the  anterior leaflet. There was severe regurgitation directed  eccentrically.  - Left atrium: The atrium was severely dilated. No evidence of  thrombus in the atrial cavity or appendage. There was spontaneous  echo contrast (&quot;smoke&quot;).  - Right atrium: The atrium was moderately dilated.  - Atrial septum: No defect or patent foramen ovale was identified.  - Tricuspid valve: Mild prolapse. No evidence of vegetation.  - Pulmonic valve: No evidence of vegetation.  - Pericardium, extracardiac: A small pericardial effusion was  identified.  Impressions:  - Hypokinesis of the inferolateral wall with overall preserved LV  function; calcified aortic valve with severe AS (mean gradient 56  mmHg); probable ruptured chord of anterior MV leaflet with severe  eccentric MR directed posterolaterally; severe LAE; no LAA  thrombus; moderate RVE; small pericardial effusion; moderate  atherosclerosis descending aorta.  -------------------------------------------------------------------  Study data: Consent: The risks, benefits, and alternatives to  the procedure were explained to the patient and informed consent  was obtained. Procedure: Initial setup. The patient was brought  to the laboratory. Surface ECG leads were monitored. Sedation.  Conscious sedation was  administered by cardiology staff.  Transesophageal echocardiography. Topical anesthesia was obtained  using viscous lidocaine. An adult multiplane transesophageal probe  was inserted by the attending cardiologistwithout difficulty. Image  quality was adequate. Study completion: The patient tolerated the  procedure well. There were no complications. Administered  medications: Midazolam, 3mg ,  IV. Fentanyl, 22mcg, IV.  Diagnostic transesophageal echocardiography. 2D and color Doppler.  Birthdate: Patient birthdate: 11-14-1929. Age: Patient is 84 yr  old. Sex: Gender: male. BMI: 24.8 kg/m^2. Blood pressure:  145/96 Patient status: Outpatient. Study date: Study date:  07/20/2017. Study time: 02:54 PM. Location: Endoscopy.  -------------------------------------------------------------------  -------------------------------------------------------------------  Left ventricle: Hypertrophy was noted. Systolic function was  normal. The estimated ejection fraction was in the range of 55% to  60%.  -------------------------------------------------------------------  Aortic valve: Trileaflet; severely calcified leaflets. Cusp  separation was severely reduced. Doppler: There was severe  stenosis. There was no significant regurgitation. Mean  gradient (S): 55 mm Hg. Peak gradient (S): 93 mm Hg.  -------------------------------------------------------------------  Aorta: Descending aorta: The descending aorta had moderate diffuse  disease.  -------------------------------------------------------------------  Mitral valve: Mildly calcified annulus. Prolapse, involving the  anterior leaflet. Doppler: There was severe regurgitation  directed eccentrically.  -------------------------------------------------------------------  Left atrium: The atrium was severely dilated. No evidence of  thrombus in the atrial cavity or appendage. There was spontaneous  echo contrast (&quot;smoke&quot;).    -------------------------------------------------------------------  Atrial septum: No defect or patent foramen ovale was identified.  -------------------------------------------------------------------  Right ventricle: The cavity size was normal. Systolic function was  normal.  -------------------------------------------------------------------  Pulmonic valve: Structurally normal valve. Cusp separation was  normal. No evidence of vegetation. Doppler: There was mild  regurgitation.  -------------------------------------------------------------------  Tricuspid valve: Leaflet separation was normal. Mild prolapse.  No evidence of vegetation. Doppler: There was mild regurgitation.  -------------------------------------------------------------------  Right atrium: The atrium was moderately dilated.  -------------------------------------------------------------------  Pericardium: A small pericardial effusion was identified.  -------------------------------------------------------------------  Measurements  LVOT Value  LVOT ID, S 20 mm  LVOT area 3.14 cm^2  Aortic valve Value  Aortic valve peak velocity, S 483 cm/s  Aortic valve mean velocity, S 342 cm/s  Aortic valve VTI, S 117 cm  Aortic mean gradient, S 55 mm Hg  Aortic peak gradient, S 93 mm Hg  Aorta Value  Ascending aorta ID, A-P, S 34 mm  Legend:  (L) and (H) mark values outside specified reference range.  -------------------------------------------------------------------  Prepared and Electronically Authenticated by  Kirk Ruths  2019-02-01T16:32:49    MERGE Images  Show images for ECHO TEE  Patient Information  Patient Name Wayne [name], Wayne [name] Sex Male DOB [birth year] SSN [id number]  Reason For Exam  Priority: Routine  Not on file  Surgical History            Surgical History   Procedure Laterality Date Comment Source  CORONARY ANGIOPLASTY  2007 Distal OM Provider  Other Surgical History   Procedure  Laterality Date Comment Source  PROSTATE BIOPSY    Provider  RIGHT/LEFT HEART CATH AND CORONARY ANGIOGRAPHY N/A 08/01/2017 Procedure: RIGHT/LEFT HEART CATH AND CORONARY ANGIOGRAPHY; Surgeon: Sherren Mocha, MD; Location: [facility]; Service: Cardiovascular; Laterality: N/A; Provider  TEE WITHOUT CARDIOVERSION N/A 07/20/2017 Procedure: TRANSESOPHAGEAL ECHOCARDIOGRAM (TEE); Surgeon: Lelon Perla, MD; Location: [facility] ENDOSCOPY; Service: Cardiovascular; Laterality: N/A; Provider  Performing Technologist/Nurse            Performing Technologist/Nurse: Kenney Houseman, RDCS        Aortic Valve Measurements            Stenosis Regurgitation     LVOT diameter 20 mm VTI 117 cm     LVOT area 3.14 cm2      VTI 117 cm      Mean grad 55 mmHg      Peak grad 93 mmHg  Aortic Root Measurements - End Diastolic    Ao-asc 34 cm    Physicians  Panel Physicians Referring Physician Case Authorizing Physician  Sherren Mocha, MD (Primary)    Procedures  RIGHT/LEFT HEART CATH AND CORONARY ANGIOGRAPHY  Conclusion  1. Calcified coronary arteries with mild nonobstructive CAD  2. Moderate pulmonary HTN with mPAP 37 mmHg  3. Large V waves 41 mmHg consistent with severe mitral regurgitation  Recommend: continue evaluation by the multidisciplinary heart valve team  Indications  Severe aortic stenosis [I35.0 (ICD-10-CM)]  Procedural Details/Technique  Technical Details INDICATION: Severe aortic stenosis. 84 yo male with severe aortic stenosis and severe mitral regurgitation being considered for TAVR  PROCEDURAL DETAILS: There was an indwelling IV in a right antecubital vein. Using normal sterile technique, the IV was changed out for a 5 Fr brachial sheath over a 0.018 inch wire. The right wrist was then prepped, draped, and anesthetized with 1% lidocaine. Using the modified Seldinger technique a 5/6 French Slender sheath was placed in the right radial artery. Intra-arterial verapamil was  administered through the radial artery sheath. IV heparin was administered after a JR4 catheter was advanced into the central aorta. A Swan-Ganz catheter was used for the right heart catheterization. Standard protocol was followed for recording of right heart pressures and sampling of oxygen saturations. Fick cardiac output was calculated. Standard Judkins catheters were used for selective coronary angiography. The aortic valve was not crossed as there is known severe aortic stenosis. There were no immediate procedural complications. The patient was transferred to the post catheterization recovery area for further monitoring.    Estimated blood loss <50 mL.  During this procedure the patient was administered the following to achieve and maintain moderate conscious sedation: Versed 1 mg, Fentanyl 25 mcg, while the patient's heart rate, blood pressure, and oxygen saturation were continuously monitored. The period of conscious sedation was 23 minutes, of which I was present face-to-face 100% of this time.  Coronary Findings  Diagnostic  Dominance: Right  Left Main  Dist LM to Ost LAD lesion 30% stenosed  Dist LM to Ost LAD lesion is 30% stenosed. there is mild tapering of the distal left main without significant obstruction  Left Anterior Descending  There is mild diffuse disease throughout the vessel. The vessel is severely calcified. The LAD is heavily calcified but there is no obstructive disease present. There is mild diffuse stenosis present.  Ramus Intermedius  Vessel is large. Large intermediate branch without stenosis  Left Circumflex  The vessel exhibits minimal luminal irregularities.  Right Coronary Artery  There is mild diffuse disease throughout the vessel. Large, dominant RCA, without obstructive disease  Prox RCA to Mid RCA lesion 30% stenosed  Prox RCA to Mid RCA lesion is 30% stenosed. The lesion is moderately calcified.  Right Posterior Descending Artery  Vessel is  angiographically normal.  Inferior Septal  Vessel is large in size.  Intervention  No interventions have been documented.  Coronary Diagrams  Diagnostic Diagram     Implants     No implant documentation for this case.  MERGE Images  Link to Procedure Log   Show images for CARDIAC CATHETERIZATION Procedure Log  Hemo Data   Most Recent Value  Fick Cardiac Output 4.92 L/min  Fick Cardiac Output Index 2.51 (L/min)/BSA  RA A Wave 5 mmHg  RA V Wave 8 mmHg  RA Mean 6 mmHg  RV Systolic Pressure 62 mmHg  RV Diastolic Pressure -2 mmHg  RV EDP 6 mmHg  PA  Systolic Pressure 60 mmHg  PA Diastolic Pressure 23 mmHg  PA Mean 37 mmHg  PW A Wave 22 mmHg  PW V Wave 41 mmHg  PW Mean 25 mmHg  AO Systolic Pressure 371 mmHg  AO Diastolic Pressure 52 mmHg  AO Mean 69 mmHg  QP/QS 1  TPVR Index 14.74 HRUI  TSVR Index 27.48 HRUI  PVR SVR Ratio 0.19  TPVR/TSVR Ratio 0.54     ADDENDUM REPORT: 08/06/2017 18:09  CLINICAL DATA: 84 year old male with severe aortic stenosis being  evaluated for a TAVR procedure.  EXAM:  Cardiac TAVR CT  TECHNIQUE:  The patient was scanned on a [employer]. A 120 kV  retrospective scan was triggered in the descending thoracic aorta at  111 HU's. Gantry rotation speed was 250 msecs and collimation was .6  mm. 5 mg of iv Metoprolol and no nitro were given. The 3D data set  was reconstructed in 5% intervals of the R-R cycle. Systolic and  diastolic phases were analyzed on a dedicated work station using  MPR, MIP and VRT modes. The patient received 80 cc of contrast.  FINDINGS:  Aortic Valve: Trileaflet, severely thickened and calcified aortic  valve with severely restricted leaflet opening and only minimal  calcifications extending into the LVOT.  Aorta: Normal size, moderate diffuse calcifications, no dissection.  Sinotubular Junction: 31 x 29 mm  Ascending Thoracic Aorta: 35 x 35 mm  Aortic Arch: 29 x 26 mm  Descending Thoracic Aorta: 28 x 26 mm    Sinus of Valsalva Measurements:  Non-coronary: 33 mm  Right -coronary: 35 mm  Left -coronary: 35 mm  Coronary Artery Height above Annulus:  Left Main: 17 mm  Right Coronary: 16 mm  Virtual Basal Annulus Measurements:  Maximum/Minimum Diameter: 31.2 x 21.9 mm  Mean Diameter: 25.1 mm  Perimeter: 82.6 mm  Area: 493 mm2  Optimum Fluoroscopic Angle for Delivery: LAU 14 CAU 12  IMPRESSION:  1. Trileaflet, severely thickened and calcified aortic valve with  severely restricted leaflet opening and only minimal calcifications  extending into the LVOT. Annular measurements suitable for delivery  of a 26 mm Edwards-SAPIEN 3 valve.  2. Sufficient coronary to annulus distance.  3. Optimum Fluoroscopic Angle for Delivery: LAU 14 CAU 12  4. No thrombus in the left atrial appendage.  Electronically Signed  By: Ena Dawley  On: 08/06/2017 18:09   Addended by Dorothy Spark, MD on 08/06/2017 6:11 PM    Study Result   EXAM:  OVER-READ INTERPRETATION CT CHEST  The following report is an over-read performed by radiologist Dr.  Vinnie Langton of [facility], [place] on 08/06/2017. This  over-read does not include interpretation of cardiac or coronary  anatomy or pathology. The coronary calcium score/coronary CTA  interpretation by the cardiologist is attached.  COMPARISON: None.  FINDINGS:  Extracardiac findings will be described separately under dictation  for contemporaneously obtained CTA chest, abdomen and pelvis.  IMPRESSION:  Please see separate dictation for contemporaneously obtained CTA  chest, abdomen and pelvis dated 08/06/2017 for full description of  relevant extracardiac findings.  Electronically Signed:  By: Vinnie Langton M.D.  On: 08/06/2017 13:45   CLINICAL DATA: 84 year old male with history of severe aortic  stenosis. Preprocedural study prior to potential transcatheter  aortic valve replacement (TAVR).  EXAM:  CT ANGIOGRAPHY CHEST, ABDOMEN AND PELVIS   TECHNIQUE:  Multidetector CT imaging through the chest, abdomen and pelvis was  performed using the standard protocol during bolus administration of  intravenous contrast.  Multiplanar reconstructed images and MIPs were  obtained and reviewed to evaluate the vascular anatomy.  CONTRAST: 151mL ISOVUE-370 IOPAMIDOL (ISOVUE-370) INJECTION 76%  COMPARISON: No priors  FINDINGS:  CTA CHEST FINDINGS  Cardiovascular: Heart size is enlarged with severe left atrial  dilatation. There is no significant pericardial fluid, thickening or  pericardial calcification. There is aortic atherosclerosis, as well  as atherosclerosis of the great vessels of the mediastinum and the  coronary arteries, including calcified atherosclerotic plaque in the  left main, left anterior descending, left circumflex and right  coronary arteries. Large area of myocardial thinning involving the  lateral, inferolateral and inferior wall segments in the mid left  ventricle, indicative of fibrofatty metaplasia and scarring from  prior circumflex territory myocardial infarction(s). Severe  thickening calcification of the aortic valve. Thickening  calcification of the mitral valve and mitral annulus.  Mediastinum/Lymph Nodes: No pathologically enlarged mediastinal or  hilar lymph nodes. Esophagus is unremarkable in appearance. No  axillary lymphadenopathy.  Lungs/Pleura: Moderate bilateral pleural effusions lying  dependently. No acute consolidative airspace disease. No suspicious  appearing pulmonary nodules or masses. Linear scarring in the left  upper lobe.  Musculoskeletal/Soft Tissues: There are no aggressive appearing  lytic or blastic lesions noted in the visualized portions of the  skeleton.  CTA ABDOMEN AND PELVIS FINDINGS  Hepatobiliary: No suspicious cystic or solid hepatic lesions. No  intra or extrahepatic biliary ductal dilatation. Gallbladder is  normal in appearance.  Pancreas: No pancreatic mass. No  pancreatic ductal dilatation. No  pancreatic or peripancreatic fluid or inflammatory changes.  Spleen: Unremarkable.  Adrenals/Urinary Tract: 1.6 cm exophytic simple cyst extending off  the lateral aspect of the upper pole of the right kidney. 1.6 cm  partially exophytic simple cyst in the interpolar region of the left  kidney laterally. No suspicious renal lesions. Bilateral adrenal  glands are normal in appearance. No hydroureteronephrosis. 3.9 cm  left-sided bladder diverticulum with some mild irregularity in the  dependent portion of the diverticulum (axial image 191 of series  15). Small amount of contrast material lying dependently in the  urinary bladder, presumably related to test injection.  Stomach/Bowel: Normal appearance of the stomach. No pathologic  dilatation of small bowel or colon. Normal appendix.  Vascular/Lymphatic: Aortic atherosclerosis, with vascular findings  and measurements pertinent to potential TAVR procedure, as detailed  below. No aneurysm or dissection noted in the abdominal or pelvic  vasculature. Celiac axis, superior mesenteric artery and inferior  mesenteric artery and their major branches are widely patent without  hemodynamically significant stenosis. Single renal arteries are  widely patent bilaterally. No lymphadenopathy noted in the abdomen  or pelvis.  Reproductive: Prostate gland and seminal vesicles are unremarkable  in appearance.  Other: No significant volume of ascites. No pneumoperitoneum.  Musculoskeletal: Chronic appearing compression fracture of L3 with  approximately 20% loss of anterior vertebral body height. There are  no aggressive appearing lytic or blastic lesions noted in the  visualized portions of the skeleton.  VASCULAR MEASUREMENTS PERTINENT TO TAVR:  AORTA:  Minimal Aortic Diameter-16 x 16 mm  Severity of Aortic Calcification-severe  RIGHT PELVIS:  Right Common Iliac Artery -  Minimal Diameter-10.1 x 10.5 mm    Tortuosity-mild  Calcification-moderate  Right External Iliac Artery -  Minimal Diameter-8.9 x 8.9 mm  Tortuosity-severe  Calcification-mild  Right Common Femoral Artery -  Minimal Diameter-9.4 x 10.4 mm  Tortuosity-mild  Calcification-mild  LEFT PELVIS:  Left Common Iliac Artery -  Minimal Diameter-11.1 x 10.0 mm  Tortuosity-mild  Calcification-moderate  Left External Iliac Artery -  Minimal Diameter-8.6 x 7.7 mm  Tortuosity-moderate  Calcification-mild  Left Common Femoral Artery -  Minimal Diameter-8.7 x 8.3 mm  Tortuosity-mild  Calcification-mild  Review of the MIP images confirms the above findings.  IMPRESSION:  1. Vascular findings and measurements pertinent to potential TAVR  procedure, as detailed above.  2. Severe thickening calcification of the aortic valve, compatible  with the reported clinical history of severe aortic stenosis.  3. Aortic atherosclerosis, in addition to left main and 3 vessel  coronary artery disease. Evidence of prior left circumflex territory  myocardial infarction(s), as above.  4. Severe thickening calcification of the mitral valve and mitral  annulus.  5. Cardiomegaly with severe left atrial dilatation.  6. Moderate bilateral pleural effusions lying dependently are simple  in appearance.  7. Large left-sided bladder wall diverticulum with some mild  irregularity in the dependent portion of the diverticulum. This  could simply represent some retained debris, however, nonemergent  Urologic consultation is suggested, particularly if there is any  history of hematuria as a urothelial neoplasm in this region is not  entirely excluded.  8. Additional incidental findings, as above.  Aortic Atherosclerosis (ICD10-I70.0).  Electronically Signed  By: Vinnie Langton M.D.  On: 08/06/2017 15:01  STS Risk Calculator  Procedure: Isolated AVR CALCULATE  Risk of Mortality:  3.237%   Renal Failure:  1.824%   Permanent Stroke:  1.582%    Prolonged Ventilation:  10.243%   DSW Infection:  0.104%   Reoperation:  5.634%   Morbidity or Mortality:  20.367%   Short Length of Stay:  17.255%   Long Length of Stay:  10.626%  Procedure: Isolated MVR CALCULATE  Risk of Mortality:  5.516%   Renal Failure:  1.819%   Permanent Stroke:  3.424%   Prolonged Ventilation:  12.220%   DSW Infection:  0.118%   Reoperation:  7.666%   Morbidity or Mortality:  19.447%   Short Length of Stay:  14.468%   Long Length of Stay:  12.244%    Impression:   This 84 year old gentleman has stage D, severe, symptomatic aortic stenosis with New York Heart Association class III symptoms of progressive exertional fatigue and shortness of breath, orthopnea, and lower extremity edema consistent with chronic diastolic heart failure. He was admitted in December 2018 with urinary retention and class IV heart failure symptoms with volume overload. He has limited mobility due to his prior stroke in 2007 as well as severe degenerative arthritis but according to his family he has remained fairly active until the past couple months when he has developed progressive congestive heart failure symptoms. I have personally reviewed his recent transthoracic and transesophageal echocardiograms, cardiac catheterization, and CTA studies. His aortic valve is trileaflet with severe thickening, calcification, and restricted leaflet mobility with a mean gradient by TEE of 55 mmHg consistent with severe aortic stenosis. He also has severe primary mitral regurgitation and mild mitral stenosis. 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 Cardiac catheterization shows mild nonobstructive disease with mild pulmonary hypertension. I do not think he would be a candidate for open surgical aortic valve replacement and mitral valve repair due to the high risk at 84 years old with limited mobility and a frailty rating of 2/12. I think it would be worthwhile performing transcatheter aortic  valve replacement. While this may not reduce the amount of mitral regurgitation it may improve his symptoms and functional status. He has not felt to be a  candidate for Mitraclip due to the relatively small size of his valve, limited surface area of the posterior leaflet, and pre-existing gradient across the mitral valve with mild mitral stenosis. His gated cardiac CTA shows favorable anatomy for transcatheter aortic valve replacement using a Sapien 3 valve. He has some focal basal septal hypertrophy but I do not think this would cause a problem for valve implantation. His abdominal and pelvic CTA shows suitable pelvic arterial anatomy for transfemoral insertion.  The patient and his wife and daughter were counseled at length regarding treatment alternatives for management of severe symptomatic aortic stenosis. The risks and benefits of surgical intervention have been discussed in detail. Long-term prognosis with medical therapy was discussed. Alternative approaches such as conventional surgical aortic valve replacement, transcatheter aortic valve replacement, and palliative medical therapy were compared and contrasted at length. This discussion was placed in the context of the patient's own specific clinical presentation and past medical history. All of their questions been addressed.  Following the decision to proceed with transcatheter aortic valve replacement, a discussion was held regarding what types of management strategies would be attempted intraoperatively in the event of life-threatening complications, including whether or not the patient would be considered a candidate for the use of cardiopulmonary bypass and/or conversion to open sternotomy for attempted surgical intervention. I do not think he is a candidate for open sternotomy under any circumstances. The patient has been advised of a variety of complications that might develop including but not limited to risks of death, stroke, paravalvular leak,  aortic dissection or other major vascular complications, aortic annulus rupture, device embolization, cardiac rupture or perforation, mitral regurgitation, acute myocardial infarction, arrhythmia, heart block or bradycardia requiring permanent pacemaker placement, congestive heart failure, respiratory failure, renal failure, pneumonia, infection, other late complications related to structural valve deterioration or migration, or other complications that might ultimately cause a temporary or permanent loss of functional independence or other long term morbidity. The patient provides full informed consent for the procedure as described and all questions were answered.   Plan:   Transfemoral transcatheter aortic valve replacement on Tuesday, 09/04/2017.   Gaye Pollack, MD

## 2017-09-04 ENCOUNTER — Inpatient Hospital Stay (HOSPITAL_COMMUNITY)
Admission: RE | Admit: 2017-09-04 | Discharge: 2017-09-06 | DRG: 266 | Disposition: A | Discharge status: Home Health | Payer: Medicare HMO | Source: Ambulatory Visit | Attending: Cardiovascular Disease | Admitting: Cardiovascular Disease

## 2017-09-04 ENCOUNTER — Inpatient Hospital Stay (HOSPITAL_COMMUNITY): Payer: Medicare HMO

## 2017-09-04 ENCOUNTER — Encounter (HOSPITAL_COMMUNITY)
Admission: RE | Disposition: A | Discharge status: Home Health | Payer: Self-pay | Source: Ambulatory Visit | Attending: Cardiovascular Disease

## 2017-09-04 ENCOUNTER — Inpatient Hospital Stay (HOSPITAL_COMMUNITY): Payer: Medicare HMO | Admitting: Emergency Medicine

## 2017-09-04 ENCOUNTER — Other Ambulatory Visit: Payer: Self-pay

## 2017-09-04 ENCOUNTER — Encounter (HOSPITAL_COMMUNITY): Payer: Self-pay | Admitting: *Deleted

## 2017-09-04 ENCOUNTER — Inpatient Hospital Stay (HOSPITAL_COMMUNITY): Payer: Medicare HMO | Admitting: Certified Registered"

## 2017-09-04 DIAGNOSIS — I252 Old myocardial infarction: Secondary | ICD-10-CM

## 2017-09-04 DIAGNOSIS — I350 Nonrheumatic aortic (valve) stenosis: Secondary | ICD-10-CM

## 2017-09-04 DIAGNOSIS — Z7901 Long term (current) use of anticoagulants: Secondary | ICD-10-CM

## 2017-09-04 DIAGNOSIS — Z952 Presence of prosthetic heart valve: Secondary | ICD-10-CM | POA: Diagnosis not present

## 2017-09-04 DIAGNOSIS — N401 Enlarged prostate with lower urinary tract symptoms: Secondary | ICD-10-CM | POA: Diagnosis present

## 2017-09-04 DIAGNOSIS — Z8249 Family history of ischemic heart disease and other diseases of the circulatory system: Secondary | ICD-10-CM

## 2017-09-04 DIAGNOSIS — I110 Hypertensive heart disease with heart failure: Secondary | ICD-10-CM | POA: Diagnosis present

## 2017-09-04 DIAGNOSIS — I2510 Atherosclerotic heart disease of native coronary artery without angina pectoris: Secondary | ICD-10-CM | POA: Diagnosis present

## 2017-09-04 DIAGNOSIS — Z8673 Personal history of transient ischemic attack (TIA), and cerebral infarction without residual deficits: Secondary | ICD-10-CM

## 2017-09-04 DIAGNOSIS — I482 Chronic atrial fibrillation, unspecified: Secondary | ICD-10-CM | POA: Diagnosis present

## 2017-09-04 DIAGNOSIS — Z7982 Long term (current) use of aspirin: Secondary | ICD-10-CM

## 2017-09-04 DIAGNOSIS — N138 Other obstructive and reflux uropathy: Secondary | ICD-10-CM | POA: Diagnosis not present

## 2017-09-04 DIAGNOSIS — Z9861 Coronary angioplasty status: Secondary | ICD-10-CM

## 2017-09-04 DIAGNOSIS — Z79899 Other long term (current) drug therapy: Secondary | ICD-10-CM

## 2017-09-04 DIAGNOSIS — R531 Weakness: Secondary | ICD-10-CM | POA: Diagnosis not present

## 2017-09-04 DIAGNOSIS — J811 Chronic pulmonary edema: Secondary | ICD-10-CM | POA: Diagnosis not present

## 2017-09-04 DIAGNOSIS — I080 Rheumatic disorders of both mitral and aortic valves: Secondary | ICD-10-CM | POA: Diagnosis not present

## 2017-09-04 DIAGNOSIS — I081 Rheumatic disorders of both mitral and tricuspid valves: Secondary | ICD-10-CM | POA: Diagnosis not present

## 2017-09-04 DIAGNOSIS — I5032 Chronic diastolic (congestive) heart failure: Secondary | ICD-10-CM | POA: Diagnosis present

## 2017-09-04 DIAGNOSIS — I340 Nonrheumatic mitral (valve) insufficiency: Secondary | ICD-10-CM | POA: Diagnosis present

## 2017-09-04 DIAGNOSIS — Z006 Encounter for examination for normal comparison and control in clinical research program: Secondary | ICD-10-CM | POA: Diagnosis not present

## 2017-09-04 DIAGNOSIS — I272 Pulmonary hypertension, unspecified: Secondary | ICD-10-CM | POA: Diagnosis present

## 2017-09-04 DIAGNOSIS — I5033 Acute on chronic diastolic (congestive) heart failure: Secondary | ICD-10-CM | POA: Diagnosis present

## 2017-09-04 DIAGNOSIS — I10 Essential (primary) hypertension: Secondary | ICD-10-CM | POA: Diagnosis present

## 2017-09-04 HISTORY — PX: TEE WITHOUT CARDIOVERSION: SHX5443

## 2017-09-04 HISTORY — PX: TRANSCATHETER AORTIC VALVE REPLACEMENT, TRANSFEMORAL: SHX6400

## 2017-09-04 LAB — POCT I-STAT, CHEM 8
BUN: 14 mg/dL (ref 6–20)
BUN: 13 mg/dL (ref 6–20)
BUN: 13 mg/dL (ref 6–20)
CALCIUM ION: 1.17 mmol/L (ref 1.15–1.40)
CALCIUM ION: 1.21 mmol/L (ref 1.15–1.40)
CHLORIDE: 96 mmol/L — AB (ref 101–111)
CHLORIDE: 96 mmol/L — AB (ref 101–111)
CHLORIDE: 97 mmol/L — AB (ref 101–111)
CREATININE: 0.8 mg/dL (ref 0.61–1.24)
CREATININE: 0.7 mg/dL (ref 0.61–1.24)
CREATININE: 0.7 mg/dL (ref 0.61–1.24)
Calcium, Ion: 1.15 mmol/L (ref 1.15–1.40)
GLUCOSE: 92 mg/dL (ref 65–99)
GLUCOSE: 94 mg/dL (ref 65–99)
GLUCOSE: 99 mg/dL (ref 65–99)
HCT: 31 % — ABNORMAL LOW (ref 39.0–52.0)
HCT: 29 % — ABNORMAL LOW (ref 39.0–52.0)
HCT: 27 % — ABNORMAL LOW (ref 39.0–52.0)
HEMOGLOBIN: 10.5 g/dL — AB (ref 13.0–17.0)
Hemoglobin: 9.9 g/dL — ABNORMAL LOW (ref 13.0–17.0)
Hemoglobin: 9.2 g/dL — ABNORMAL LOW (ref 13.0–17.0)
POTASSIUM: 3.5 mmol/L (ref 3.5–5.1)
POTASSIUM: 3.6 mmol/L (ref 3.5–5.1)
Potassium: 3.7 mmol/L (ref 3.5–5.1)
Sodium: 135 mmol/L (ref 135–145)
Sodium: 136 mmol/L (ref 135–145)
Sodium: 137 mmol/L (ref 135–145)
TCO2: 26 mmol/L (ref 22–32)
TCO2: 27 mmol/L (ref 22–32)
TCO2: 26 mmol/L (ref 22–32)

## 2017-09-04 LAB — PROTIME-INR
INR: 1.45
PROTHROMBIN TIME: 17.5 s — AB (ref 11.4–15.2)

## 2017-09-04 LAB — CBC
HCT: 29.7 % — ABNORMAL LOW (ref 39.0–52.0)
Hemoglobin: 9.7 g/dL — ABNORMAL LOW (ref 13.0–17.0)
MCH: 29.1 pg (ref 26.0–34.0)
MCHC: 32.7 g/dL (ref 30.0–36.0)
MCV: 89.2 fL (ref 78.0–100.0)
PLATELETS: 162 10*3/uL (ref 150–400)
RBC: 3.33 MIL/uL — ABNORMAL LOW (ref 4.22–5.81)
RDW: 16.8 % — ABNORMAL HIGH (ref 11.5–15.5)
WBC: 9.8 10*3/uL (ref 4.0–10.5)

## 2017-09-04 LAB — POCT I-STAT 3, ART BLOOD GAS (G3+)
Acid-Base Excess: 1 mmol/L (ref 0.0–2.0)
Bicarbonate: 26 mmol/L (ref 20.0–28.0)
O2 SAT: 97 %
PH ART: 7.404 (ref 7.350–7.450)
TCO2: 27 mmol/L (ref 22–32)
pCO2 arterial: 41.6 mmHg (ref 32.0–48.0)
pO2, Arterial: 87 mmHg (ref 83.0–108.0)

## 2017-09-04 LAB — GLUCOSE, CAPILLARY: GLUCOSE-CAPILLARY: 216 mg/dL — AB (ref 65–99)

## 2017-09-04 LAB — POCT I-STAT 4, (NA,K, GLUC, HGB,HCT)
Glucose, Bld: 100 mg/dL — ABNORMAL HIGH (ref 65–99)
HCT: 28 % — ABNORMAL LOW (ref 39.0–52.0)
Hemoglobin: 9.5 g/dL — ABNORMAL LOW (ref 13.0–17.0)
POTASSIUM: 3.9 mmol/L (ref 3.5–5.1)
Sodium: 137 mmol/L (ref 135–145)

## 2017-09-04 SURGERY — IMPLANTATION, AORTIC VALVE, TRANSCATHETER, FEMORAL APPROACH
Anesthesia: Monitor Anesthesia Care | Site: Chest

## 2017-09-04 MED ORDER — PROPOFOL 500 MG/50ML IV EMUL
INTRAVENOUS | Status: DC | PRN
  Administered 2017-09-04: 25 ug/kg/min via INTRAVENOUS

## 2017-09-04 MED ORDER — CHLORHEXIDINE GLUCONATE 0.12 % MT SOLN
15.0000 mL | Freq: Once | OROMUCOSAL | Status: AC
  Administered 2017-09-04: 15 mL via OROMUCOSAL

## 2017-09-04 MED ORDER — TRAMADOL HCL 50 MG PO TABS: 50.0000 mg | ORAL_TABLET | ORAL | Status: DC | PRN

## 2017-09-04 MED ORDER — VANCOMYCIN HCL IN DEXTROSE 1-5 GM/200ML-% IV SOLN
1000.0000 mg | Freq: Once | INTRAVENOUS | Status: AC
  Administered 2017-09-04: 1000 mg via INTRAVENOUS
  Filled 2017-09-04: qty 200

## 2017-09-04 MED ORDER — ASPIRIN 81 MG PO CHEW: 81.0000 mg | CHEWABLE_TABLET | Freq: Every day | ORAL | Status: DC

## 2017-09-04 MED ORDER — ONDANSETRON HCL 4 MG/2ML IJ SOLN
INTRAMUSCULAR | Status: DC | PRN
  Administered 2017-09-04: 4 mg via INTRAVENOUS

## 2017-09-04 MED ORDER — ACETAMINOPHEN 325 MG PO TABS
650.0000 mg | ORAL_TABLET | Freq: Once | ORAL | Status: AC
  Administered 2017-09-04: 650 mg via ORAL
  Filled 2017-09-04: qty 2

## 2017-09-04 MED ORDER — ACETAMINOPHEN 160 MG/5ML PO SOLN: 1000.0000 mg | Freq: Four times a day (QID) | ORAL | Status: DC

## 2017-09-04 MED ORDER — SODIUM CHLORIDE 0.9 % IV SOLN
INTRAVENOUS | Status: DC
  Administered 2017-09-04 (×2): via INTRAVENOUS

## 2017-09-04 MED ORDER — ACETAMINOPHEN 650 MG RE SUPP: 650.0000 mg | Freq: Once | RECTAL | Status: DC

## 2017-09-04 MED ORDER — SODIUM CHLORIDE 0.9 % IV SOLN
0.0000 ug/min | INTRAVENOUS | Status: DC
  Administered 2017-09-04: 20 ug/min via INTRAVENOUS
  Filled 2017-09-04: qty 20

## 2017-09-04 MED ORDER — HEPARIN SODIUM (PORCINE) 1000 UNIT/ML IJ SOLN
INTRAMUSCULAR | Status: DC | PRN
  Administered 2017-09-04: 8000 [IU] via INTRAVENOUS

## 2017-09-04 MED ORDER — ACETAMINOPHEN 160 MG/5ML PO SOLN: 650.0000 mg | Freq: Once | ORAL | Status: DC

## 2017-09-04 MED ORDER — PROTAMINE SULFATE 10 MG/ML IV SOLN
INTRAVENOUS | Status: DC | PRN
  Administered 2017-09-04 (×2): 40 mg via INTRAVENOUS

## 2017-09-04 MED ORDER — CHLORHEXIDINE GLUCONATE 0.12 % MT SOLN
15.0000 mL | OROMUCOSAL | Status: AC
  Administered 2017-09-04: 15 mL via OROMUCOSAL

## 2017-09-04 MED ORDER — PANTOPRAZOLE SODIUM 40 MG PO TBEC
40.0000 mg | DELAYED_RELEASE_TABLET | Freq: Every day | ORAL | Status: DC
  Filled 2017-09-04: qty 1

## 2017-09-04 MED ORDER — DIGOXIN 125 MCG PO TABS
0.1250 mg | ORAL_TABLET | Freq: Every day | ORAL | Status: DC
  Administered 2017-09-04 – 2017-09-06 (×3): 0.125 mg via ORAL
  Filled 2017-09-04 (×3): qty 1

## 2017-09-04 MED ORDER — ORAL CARE MOUTH RINSE
15.0000 mL | Freq: Two times a day (BID) | OROMUCOSAL | Status: DC
  Administered 2017-09-04: 15 mL via OROMUCOSAL

## 2017-09-04 MED ORDER — 0.9 % SODIUM CHLORIDE (POUR BTL) OPTIME
TOPICAL | Status: DC | PRN
  Administered 2017-09-04: 2000 mL

## 2017-09-04 MED ORDER — SODIUM CHLORIDE 0.9 % IV SOLN
INTRAVENOUS | Status: DC | PRN
  Administered 2017-09-04: 1500 mL

## 2017-09-04 MED ORDER — ACETAMINOPHEN 500 MG PO TABS
1000.0000 mg | ORAL_TABLET | Freq: Four times a day (QID) | ORAL | Status: DC
  Administered 2017-09-04: 1000 mg via ORAL
  Filled 2017-09-04: qty 2

## 2017-09-04 MED ORDER — CEFAZOLIN SODIUM-DEXTROSE 2-4 GM/100ML-% IV SOLN
2.0000 g | Freq: Three times a day (TID) | INTRAVENOUS | Status: AC
  Administered 2017-09-04 – 2017-09-06 (×6): 2 g via INTRAVENOUS
  Filled 2017-09-04 (×6): qty 100

## 2017-09-04 MED ORDER — IODIXANOL 320 MG/ML IV SOLN
INTRAVENOUS | Status: DC | PRN
  Administered 2017-09-04: 60.8 mL via INTRA_ARTERIAL

## 2017-09-04 MED ORDER — LIDOCAINE HCL (PF) 1 % IJ SOLN
INTRAMUSCULAR | Status: AC
  Filled 2017-09-04: qty 30

## 2017-09-04 MED ORDER — MUCINEX DM 30-600 MG PO TB12: 1.0000 | ORAL_TABLET | Freq: Two times a day (BID) | ORAL | Status: DC | PRN

## 2017-09-04 MED ORDER — ONDANSETRON HCL 4 MG/2ML IJ SOLN: 4.0000 mg | Freq: Four times a day (QID) | INTRAMUSCULAR | Status: DC | PRN

## 2017-09-04 MED ORDER — SODIUM CHLORIDE 0.9 % IV SOLN
INTRAVENOUS | Status: DC
  Administered 2017-09-04: 13:00:00 via INTRAVENOUS

## 2017-09-04 MED ORDER — DEXAMETHASONE SODIUM PHOSPHATE 10 MG/ML IJ SOLN
INTRAMUSCULAR | Status: DC | PRN
  Administered 2017-09-04: 10 mg via INTRAVENOUS

## 2017-09-04 MED ORDER — FAMOTIDINE IN NACL 20-0.9 MG/50ML-% IV SOLN: 20.0000 mg | Freq: Two times a day (BID) | INTRAVENOUS | Status: AC

## 2017-09-04 MED ORDER — ASPIRIN EC 81 MG PO TBEC
81.0000 mg | DELAYED_RELEASE_TABLET | Freq: Every day | ORAL | Status: DC
  Administered 2017-09-05 – 2017-09-06 (×2): 81 mg via ORAL
  Filled 2017-09-04 (×2): qty 1

## 2017-09-04 MED ORDER — FENTANYL CITRATE (PF) 100 MCG/2ML IJ SOLN
INTRAMUSCULAR | Status: AC
  Filled 2017-09-04: qty 2

## 2017-09-04 MED ORDER — LACTATED RINGERS IV SOLN
INTRAVENOUS | Status: DC | PRN
  Administered 2017-09-04: 10:00:00 via INTRAVENOUS

## 2017-09-04 MED ORDER — POTASSIUM CHLORIDE 10 MEQ/50ML IV SOLN
10.0000 meq | INTRAVENOUS | Status: AC
  Administered 2017-09-04 (×3): 10 meq via INTRAVENOUS
  Filled 2017-09-04 (×3): qty 50

## 2017-09-04 MED ORDER — ALBUMIN HUMAN 5 % IV SOLN: 250.0000 mL | INTRAVENOUS | Status: DC | PRN

## 2017-09-04 MED ORDER — CHLORHEXIDINE GLUCONATE 0.12 % MT SOLN
OROMUCOSAL | Status: AC
  Filled 2017-09-04: qty 15

## 2017-09-04 MED ORDER — NITROGLYCERIN IN D5W 200-5 MCG/ML-% IV SOLN: 0.0000 ug/min | INTRAVENOUS | Status: DC

## 2017-09-04 MED ORDER — FINASTERIDE 5 MG PO TABS
5.0000 mg | ORAL_TABLET | Freq: Every day | ORAL | Status: DC
  Administered 2017-09-04 – 2017-09-05 (×2): 5 mg via ORAL
  Filled 2017-09-04 (×2): qty 1

## 2017-09-04 MED ORDER — GUAIFENESIN ER 600 MG PO TB12: 600.0000 mg | ORAL_TABLET | Freq: Two times a day (BID) | ORAL | Status: DC | PRN

## 2017-09-04 MED ORDER — LIDOCAINE HCL (PF) 1 % IJ SOLN
INTRAMUSCULAR | Status: DC | PRN
  Administered 2017-09-04: 5 mL

## 2017-09-04 MED ORDER — MIDAZOLAM HCL 2 MG/2ML IJ SOLN
INTRAMUSCULAR | Status: AC
  Filled 2017-09-04: qty 2

## 2017-09-04 MED ORDER — ALFUZOSIN HCL ER 10 MG PO TB24
10.0000 mg | ORAL_TABLET | Freq: Every day | ORAL | Status: DC
  Administered 2017-09-05 – 2017-09-06 (×2): 10 mg via ORAL
  Filled 2017-09-04 (×2): qty 1

## 2017-09-04 SURGICAL SUPPLY — 99 items
ADH SKN CLS APL DERMABOND .7 (GAUZE/BANDAGES/DRESSINGS) ×2
BAG BANDED W/RUBBER/TAPE 36X54 (MISCELLANEOUS) ×3 IMPLANT
BAG DECANTER FOR FLEXI CONT (MISCELLANEOUS) IMPLANT
BAG EQP BAND 135X91 W/RBR TAPE (MISCELLANEOUS) ×2
BAG SNAP BAND KOVER 36X36 (MISCELLANEOUS) ×6 IMPLANT
BLADE 10 SAFETY STRL DISP (BLADE) ×2 IMPLANT
BLADE CLIPPER SURG (BLADE) IMPLANT
BLADE STERNUM SYSTEM 6 (BLADE) ×2 IMPLANT
CABLE ADAPT CONN TEMP 6FT (ADAPTER) ×3 IMPLANT
CABLE PACING FASLOC BIEGE (MISCELLANEOUS) ×3 IMPLANT
CABLE PACING FASLOC BLUE (MISCELLANEOUS) ×3 IMPLANT
CANISTER SUCT 3000ML PPV (MISCELLANEOUS) ×1 IMPLANT
CANNULA FEM VENOUS REMOTE 22FR (CANNULA) IMPLANT
CANNULA OPTISITE PERFUSION 16F (CANNULA) IMPLANT
CANNULA OPTISITE PERFUSION 18F (CANNULA) IMPLANT
CATH DIAG EXPO 6F VENT PIG 145 (CATHETERS) ×6 IMPLANT
CATH EXPO 5FR AL1 (CATHETERS) ×3 IMPLANT
CATH INFINITI 6F AL2 (CATHETERS) ×3 IMPLANT
CATH S G BIP PACING (SET/KITS/TRAYS/PACK) ×3 IMPLANT
CLIP VESOCCLUDE MED 24/CT (CLIP) ×2 IMPLANT
CLIP VESOCCLUDE SM WIDE 24/CT (CLIP) ×2 IMPLANT
CONT SPEC 4OZ CLIKSEAL STRL BL (MISCELLANEOUS) ×4 IMPLANT
COVER BACK TABLE 60X90IN (DRAPES) ×2 IMPLANT
COVER BACK TABLE 80X110 HD (DRAPES) ×3 IMPLANT
COVER DOME SNAP 22 D (MISCELLANEOUS) ×6 IMPLANT
CRADLE DONUT ADULT HEAD (MISCELLANEOUS) ×3 IMPLANT
DERMABOND ADVANCED (GAUZE/BANDAGES/DRESSINGS) ×1
DERMABOND ADVANCED .7 DNX12 (GAUZE/BANDAGES/DRESSINGS) ×2 IMPLANT
DEVICE CLOSURE PERCLS PRGLD 6F (VASCULAR PRODUCTS) ×4 IMPLANT
DRAPE INCISE IOBAN 66X45 STRL (DRAPES) IMPLANT
DRAPE SLUSH MACHINE 52X66 (DRAPES) ×2 IMPLANT
DRSG TEGADERM 4X4.75 (GAUZE/BANDAGES/DRESSINGS) ×3 IMPLANT
ELECT CAUTERY BLADE 6.4 (BLADE) ×1 IMPLANT
ELECT REM PT RETURN 9FT ADLT (ELECTROSURGICAL) ×3
ELECTRODE REM PT RTRN 9FT ADLT (ELECTROSURGICAL) ×4 IMPLANT
FELT TEFLON 6X6 (MISCELLANEOUS) ×2 IMPLANT
FEMORAL VENOUS CANN RAP (CANNULA) IMPLANT
GAUZE SPONGE 4X4 12PLY STRL (GAUZE/BANDAGES/DRESSINGS) ×3 IMPLANT
GLOVE BIO SURGEON STRL SZ7.5 (GLOVE) ×2 IMPLANT
GLOVE BIO SURGEON STRL SZ8 (GLOVE) ×5 IMPLANT
GLOVE EUDERMIC 7 POWDERFREE (GLOVE) ×3 IMPLANT
GLOVE ORTHO TXT STRL SZ7.5 (GLOVE) ×2 IMPLANT
GOWN STRL REUS W/ TWL LRG LVL3 (GOWN DISPOSABLE) ×6 IMPLANT
GOWN STRL REUS W/ TWL XL LVL3 (GOWN DISPOSABLE) ×8 IMPLANT
GOWN STRL REUS W/TWL LRG LVL3 (GOWN DISPOSABLE) ×6
GOWN STRL REUS W/TWL XL LVL3 (GOWN DISPOSABLE) ×12
GUIDEWIRE SAF TJ AMPL .035X180 (WIRE) ×3 IMPLANT
GUIDEWIRE SAFE TJ AMPLATZ EXST (WIRE) ×3 IMPLANT
GUIDEWIRE STRAIGHT .035 260CM (WIRE) ×3 IMPLANT
INSERT FOGARTY SM (MISCELLANEOUS) IMPLANT
KIT BASIN OR (CUSTOM PROCEDURE TRAY) ×3 IMPLANT
KIT DILATOR VASC 18G NDL (KITS) IMPLANT
KIT HEART LEFT (KITS) ×3 IMPLANT
KIT ROOM TURNOVER OR (KITS) ×3 IMPLANT
KIT SUCTION CATH 14FR (SUCTIONS) ×4 IMPLANT
NDL HYPO 25GX1X1/2 BEV (NEEDLE) IMPLANT
NDL PERC 18GX7CM (NEEDLE) ×2 IMPLANT
NEEDLE HYPO 25GX1X1/2 BEV (NEEDLE) ×3 IMPLANT
NEEDLE PERC 18GX7CM (NEEDLE) ×3 IMPLANT
NS IRRIG 1000ML POUR BTL (IV SOLUTION) ×8 IMPLANT
PACK AORTA (CUSTOM PROCEDURE TRAY) ×2 IMPLANT
PACK ENDOVASCULAR (PACKS) ×1 IMPLANT
PAD ARMBOARD 7.5X6 YLW CONV (MISCELLANEOUS) ×6 IMPLANT
PAD ELECT DEFIB RADIOL ZOLL (MISCELLANEOUS) ×3 IMPLANT
PATCH TACHOSII LRG 9.5X4.8 (VASCULAR PRODUCTS) IMPLANT
PENCIL BUTTON HOLSTER BLD 10FT (ELECTRODE) ×1 IMPLANT
PERCLOSE PROGLIDE 6F (VASCULAR PRODUCTS) ×6
SET MICROPUNCTURE 5F STIFF (MISCELLANEOUS) ×3 IMPLANT
SHEATH PINNACLE 6F 10CM (SHEATH) ×6 IMPLANT
SHEATH PINNACLE 8F 10CM (SHEATH) ×3 IMPLANT
SLEEVE REPOSITIONING LENGTH 30 (MISCELLANEOUS) ×3 IMPLANT
SPONGE LAP 4X18 X RAY DECT (DISPOSABLE) ×2 IMPLANT
STOPCOCK MORSE 400PSI 3WAY (MISCELLANEOUS) ×6 IMPLANT
SUT ETHIBOND X763 2 0 SH 1 (SUTURE) IMPLANT
SUT GORETEX CV 4 TH 22 36 (SUTURE) IMPLANT
SUT GORETEX CV4 TH-18 (SUTURE) IMPLANT
SUT MNCRL AB 3-0 PS2 18 (SUTURE) IMPLANT
SUT PROLENE 5 0 C 1 36 (SUTURE) IMPLANT
SUT PROLENE 6 0 C 1 30 (SUTURE) IMPLANT
SUT SILK  1 MH (SUTURE) ×1
SUT SILK 1 MH (SUTURE) ×2 IMPLANT
SUT VIC AB 2-0 CT1 27 (SUTURE)
SUT VIC AB 2-0 CT1 TAPERPNT 27 (SUTURE) IMPLANT
SUT VIC AB 2-0 CTX 36 (SUTURE) IMPLANT
SUT VIC AB 3-0 SH 8-18 (SUTURE) IMPLANT
SYR 10ML LL (SYRINGE) ×6 IMPLANT
SYR 30ML LL (SYRINGE) ×4 IMPLANT
SYR 50ML LL SCALE MARK (SYRINGE) ×3 IMPLANT
SYR CONTROL 10ML LL (SYRINGE) ×1 IMPLANT
SYR MEDRAD MARK V 150ML (SYRINGE) ×3 IMPLANT
TOWEL GREEN STERILE (TOWEL DISPOSABLE) ×6 IMPLANT
TRANSDUCER W/STOPCOCK (MISCELLANEOUS) ×6 IMPLANT
TRAY FOLEY SILVER 14FR TEMP (SET/KITS/TRAYS/PACK) ×3 IMPLANT
TUBE SUCT INTRACARD DLP 20F (MISCELLANEOUS) IMPLANT
VALVE HEART TRANSCATH SZ3 26MM (Prosthesis & Implant Heart) ×1 IMPLANT
WIRE .035 3MM-J 145CM (WIRE) ×3 IMPLANT
WIRE AMPLATZ SS-J .035X180CM (WIRE) ×3 IMPLANT
WIRE BENTSON .035X145CM (WIRE) ×3 IMPLANT
YANKAUER SUCT BULB TIP NO VENT (SUCTIONS) ×1 IMPLANT

## 2017-09-04 NOTE — Op Note (Signed)
[facility]   MULTIDISCIPLINARY HEART VALVE TEAM   TAVR OPERATIVE NOTE   Date of Procedure:  09/04/2017  Preoperative Diagnosis: Severe Aortic Stenosis   Postoperative Diagnosis: Same   Procedure:    Transcatheter Aortic Valve Replacement - Percutaneous  Transfemoral Approach  Edwards Sapien 3 THV (size 26 mm, model # 9600TFX, serial # [id number])   Co-Surgeons:  Gaye Pollack, MD and Sherren Mocha, MD  Anesthesiologist:  Dr Fransisco Beau  Echocardiographer:  Dr Meda Coffee  Pre-operative Echo Findings:  Severe aortic stenosis  Normal left ventricular systolic function  Severe MR  Post-operative Echo Findings:  Trace paravalvular leak  Normal left ventricular systolic function  No change in MR  BRIEF CLINICAL NOTE AND INDICATIONS FOR SURGERY 84 yo male with severe AS, severe MR, permanent atrial fibrillation on warfarin, presents for elective TAVR. He has progressive dyspnea secondary to acute on chronic diastolic heart failure from severe valvular heart disease. Considering his advanced age, frailty, prior stroke, and other comorbid conditions, he is not a candidate for double heart valve surgery. After review of options by the multidisciplinary heart valve team, TAVR is felt to be an appropriate treatment option.   During the course of the patient's preoperative work up they have been evaluated comprehensively by a multidisciplinary team of specialists coordinated through the [facility] in the [facility].  They have been demonstrated to suffer from symptomatic severe aortic stenosis as noted above. The patient has been counseled extensively as to the relative risks and benefits of all options for the treatment of severe aortic stenosis including long term medical therapy, conventional surgery for aortic valve replacement, and transcatheter aortic valve replacement.  The patient has been independently evaluated by two  cardiac surgeons including Dr. Roxy Manns and Dr. Cyndia Bent, and they are felt to be at high risk for conventional surgical aortic valve replacement. Based upon review of all of the patient's preoperative diagnostic tests they are felt to be candidate for transcatheter aortic valve replacement using the transfemoral approach as an alternative to high risk conventional surgery.    Following the decision to proceed with transcatheter aortic valve replacement, a discussion has been held regarding what types of management strategies would be attempted intraoperatively in the event of life-threatening complications, including whether or not the patient would be considered a candidate for the use of cardiopulmonary bypass and/or conversion to open sternotomy for attempted surgical intervention.  The patient has been advised of a variety of complications that might develop peculiar to this approach including but not limited to risks of death, stroke, paravalvular leak, aortic dissection or other major vascular complications, aortic annulus rupture, device embolization, cardiac rupture or perforation, acute myocardial infarction, arrhythmia, heart block or bradycardia requiring permanent pacemaker placement, congestive heart failure, respiratory failure, renal failure, pneumonia, infection, other late complications related to structural valve deterioration or migration, or other complications that might ultimately cause a temporary or permanent loss of functional independence or other long term morbidity.  The patient provides full informed consent for the procedure as described and all questions were answered preoperatively.  DETAILS OF THE OPERATIVE PROCEDURE PREPARATION:   The patient is brought to the operating room on the above mentioned date and central monitoring was established by the anesthesia team including placement of a central venous catheter and radial arterial line. The patient is placed in the supine position  on the operating table.  Intravenous antibiotics are administered. The patient is monitored closely throughout  the procedure under conscious sedation.  Baseline transthoracic echocardiogram is performed. The patient's chest, abdomen, both groins, and both lower extremities are prepared and draped in a sterile manner. A time out procedure is performed.   PERIPHERAL ACCESS:   Using ultrasound guidance, femoral arterial and venous access is obtained with placement of 6 Fr sheaths on the left side.  A pigtail diagnostic catheter was passed through the femoral arterial sheath under fluoroscopic guidance into the aortic root.  A temporary transvenous pacemaker catheter was passed through the femoral venous sheath under fluoroscopic guidance into the right ventricle.  The pacemaker was tested to ensure stable lead placement and pacemaker capture. Aortic root angiography was performed in order to determine the optimal angiographic angle for valve deployment.  TRANSFEMORAL ACCESS:  A micropuncture technique is used to access the right femoral artery under fluoroscopic and ultrasound guidance.  2 Perclose devices are deployed at 10' and 2' positions to 'PreClose' the femoral artery. An 8 French sheath is placed and then an Amplatz Superstiff wire is advanced through the sheath. This is changed out for a 14 French transfemoral E-Sheath after progressively dilating over the Superstiff wire.  An AL-2 catheter was used to direct a straight-tip exchange length wire across the native aortic valve into the left ventricle. This was exchanged out for a pigtail catheter and position was confirmed in the LV apex. Simultaneous LV and Ao pressures were recorded.  The pigtail catheter was exchanged for an Amplatz Extra-stiff wire in the LV apex.  Echocardiography was utilized to confirm appropriate wire position and no sign of entanglement in the mitral subvalvular apparatus.  BALLOON AORTIC VALVULOPLASTY:  Not performed    TRANSCATHETER HEART VALVE DEPLOYMENT:  An Edwards Sapien 3 transcatheter heart valve (size 26 mm, model #9600TFX, serial #[id number]) was prepared and crimped per manufacturer's guidelines, and the proper orientation of the valve is confirmed on the [employer] delivery system. The valve was advanced through the introducer sheath using normal technique until in an appropriate position in the abdominal aorta beyond the sheath tip. The balloon was then retracted and using the fine-tuning wheel was centered on the valve. The valve was then advanced across the aortic arch using appropriate flexion of the catheter. The valve was carefully positioned across the aortic valve annulus. The Commander catheter was retracted using normal technique. Once final position of the valve has been confirmed by angiographic assessment, the valve is deployed while temporarily holding ventilation and during rapid ventricular pacing to maintain systolic blood pressure < 50 mmHg and pulse pressure < 10 mmHg. The balloon inflation is held for >3 seconds after reaching full deployment volume. Once the balloon has fully deflated the balloon is retracted into the ascending aorta and valve function is assessed using echocardiography. There is felt to be trace paravalvular leak and no central aortic insufficiency.  The patient's hemodynamic recovery following valve deployment is good.  The deployment balloon and guidewire are both removed. Echo demostrated acceptable post-procedural gradients, stable mitral valve function, and trace aortic insufficiency.   PROCEDURE COMPLETION:  The sheath was removed and femoral artery closure is performed using the 2 previously deployed Perclose devices.  Protamine is administered once femoral arterial repair was complete. The site is clear with no evidence of bleeding or hematoma after the sutures are tightened. The temporary pacemaker, pigtail catheters and femoral sheaths were removed with manual  pressure used for hemostasis.   The patient tolerated the procedure well and is transported to the surgical intensive care  in stable condition. There were no immediate intraoperative complications. All sponge instrument and needle counts are verified correct at completion of the operation.   The patient received a total of 60 mL of intravenous contrast during the procedure.  Sherren Mocha, MD 09/04/2017 12:12 PM

## 2017-09-04 NOTE — Interval H&P Note (Signed)
History and Physical Interval Note:  09/04/2017 9:34 AM  Wayne [name]  has presented today for surgery, with the diagnosis of Severe aortic stenosis  The various methods of treatment have been discussed with the patient and family. After consideration of risks, benefits and other options for treatment, the patient has consented to  Procedure(s): TRANSCATHETER AORTIC VALVE REPLACEMENT, TRANSFEMORAL (N/A) TRANSESOPHAGEAL ECHOCARDIOGRAM (TEE) (N/A) as a surgical intervention .  The patient's history has been reviewed, patient examined, no change in status, stable for surgery.  I have reviewed the patient's chart and labs.  Questions were answered to the patient's satisfaction.     Gaye Pollack

## 2017-09-04 NOTE — Progress Notes (Signed)
Pt c/o insomnia.  Takes 2 mg melatonin at home.  RN paged cards fellow for orders.   Will pass on to day shift as well.

## 2017-09-04 NOTE — Anesthesia Procedure Notes (Signed)
Procedure Name: MAC Date/Time: 09/04/2017 10:10 AM Performed by: Teressa Lower., CRNA Pre-anesthesia Checklist: Patient identified, Emergency Drugs available, Suction available, Patient being monitored and Timeout performed Patient Re-evaluated:Patient Re-evaluated prior to induction Oxygen Delivery Method: Simple face mask

## 2017-09-04 NOTE — OR Nursing (Signed)
ACT 148. 6Fr arterial and venous sheath pulled from the left femoral artery and vein. Pressure held for twenty minutes. Patient's vital signs stable prior to, during, and after sheath pull. No signs and symptoms of hematoma or bleeding present. Post sheath pull care given to SICU Rn on arrival to unit.

## 2017-09-04 NOTE — Op Note (Signed)
[facility]   MULTIDISCIPLINARY HEART VALVE TEAM   TAVR OPERATIVE NOTE   Date of Procedure:  09/04/2017  Preoperative Diagnosis: Severe Aortic Stenosis   Postoperative Diagnosis: Same   Procedure:    Transcatheter Aortic Valve Replacement - Percutaneous Right Transfemoral Approach  Edwards Sapien 3 THV (size 26 mm, model # 9600TFX, serial # [id number])   Co-Surgeons:  Gaye Pollack, MD and Sherren Mocha, MD   Anesthesiologist:  Raechel Ache, MD  Echocardiographer:  Ena Dawley, MD  Pre-operative Echo Findings:  Severe aortic stenosis  Normal left ventricular systolic function  Severe MR  Post-operative Echo Findings:  Trace paravalvular leak  Normal left ventricular systolic function  Unchanged severe MR   BRIEF CLINICAL NOTE AND INDICATIONS FOR SURGERY  This 84 year old gentleman has stage D, severe, symptomatic aortic stenosis with New York Heart Association class III symptoms of progressive exertional fatigue and shortness of breath, orthopnea, and lower extremity edema consistent with chronic diastolic heart failure. He was admitted in December 2018 with urinary retention and class IV heart failure symptoms with volume overload. He has limited mobility due to his prior stroke in 2007 as well as severe degenerative arthritis but according to his family he has remained fairly active until the past couple months when he has developed progressive congestive heart failure symptoms. I have personally reviewed his recent transthoracic and transesophageal echocardiograms, cardiac catheterization, and CTA studies. His aortic valve is trileaflet with severe thickening, calcification, and restricted leaflet mobility with a mean gradient by TEE of 55 mmHg consistent with severe aortic stenosis. He also has severe primary mitral regurgitation and mild mitral stenosis. 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 Cardiac catheterization shows mild  nonobstructive disease with mild pulmonary hypertension. I do not think he would be a candidate for open surgical aortic valve replacement and mitral valve repair due to the high risk at 84 years old with limited mobility and a frailty rating of 2/12. I think it would be worthwhile performing transcatheter aortic valve replacement. While this may not reduce the amount of mitral regurgitation it may improve his symptoms and functional status. He has not felt to be a candidate for Mitraclip due to the relatively small size of his valve, limited surface area of the posterior leaflet, and pre-existing gradient across the mitral valve with mild mitral stenosis. His gated cardiac CTA shows favorable anatomy for transcatheter aortic valve replacement using a Sapien 3 valve. He has some focal basal septal hypertrophy but I do not think this would cause a problem for valve implantation. His abdominal and pelvic CTA shows suitable pelvic arterial anatomy for transfemoral insertion.   The patient and his wife and daughter were counseled at length regarding treatment alternatives for management of severe symptomatic aortic stenosis. The risks and benefits of surgical intervention have been discussed in detail. Long-term prognosis with medical therapy was discussed. Alternative approaches such as conventional surgical aortic valve replacement, transcatheter aortic valve replacement, and palliative medical therapy were compared and contrasted at length. This discussion was placed in the context of the patient's own specific clinical presentation and past medical history. All of their questions been addressed.   Following the decision to proceed with transcatheter aortic valve replacement, a discussion was held regarding what types of management strategies would be attempted intraoperatively in the event of life-threatening complications, including whether or not the patient would be considered a candidate for the use of  cardiopulmonary bypass and/or conversion to open sternotomy for attempted  surgical intervention. I do not think he is a candidate for open sternotomy under any circumstances. The patient has been advised of a variety of complications that might develop including but not limited to risks of death, stroke, paravalvular leak, aortic dissection or other major vascular complications, aortic annulus rupture, device embolization, cardiac rupture or perforation, mitral regurgitation, acute myocardial infarction, arrhythmia, heart block or bradycardia requiring permanent pacemaker placement, congestive heart failure, respiratory failure, renal failure, pneumonia, infection, other late complications related to structural valve deterioration or migration, or other complications that might ultimately cause a temporary or permanent loss of functional independence or other long term morbidity. The patient provides full informed consent for the procedure as described and all questions were answered.  During the course of the patient's preoperative work up they have been evaluated comprehensively by a multidisciplinary team of specialists coordinated through the [facility] in the [facility].  They have been demonstrated to suffer from symptomatic severe aortic stenosis as noted above. The patient has been counseled extensively as to the relative risks and benefits of all options for the treatment of severe aortic stenosis including long term medical therapy, conventional surgery for aortic valve replacement, and transcatheter aortic valve replacement.  All questions have been answered, and the patient provides full informed consent for the operation as described.   DETAILS OF THE OPERATIVE PROCEDURE  PREPARATION:    The patient is brought to the operating room on the above mentioned date and central monitoring was established by the anesthesia team including placement  of a central venous line and radial arterial line. The patient is placed in the supine position on the operating table.  Intravenous antibiotics are administered. The patient is monitored closely throughout the procedure under conscious sedation.  Baseline transthoracic echocardiogram was performed. The patient's chest, abdomen, both groins, and both lower extremities are prepared and draped in a sterile manner. A time out procedure is performed.   PERIPHERAL ACCESS:    Using the modified Seldinger technique, femoral arterial and venous access was obtained with placement of 6 Fr sheaths on the left side.  A pigtail diagnostic catheter was passed through the left arterial sheath under fluoroscopic guidance into the aortic root.  A temporary transvenous pacemaker catheter was passed through the left femoral venous sheath under fluoroscopic guidance into the right ventricle.  The pacemaker was tested to ensure stable lead placement and pacemaker capture. Aortic root angiography was performed in order to determine the optimal angiographic angle for valve deployment.   TRANSFEMORAL ACCESS:   The right common femoral artery was cannulated using a micropuncture needle and appropriate location was verified using hand injection angiogram.  A pair of Abbott Perclose percutaneous closure devices were placed and a 6 French sheath replaced into the femoral artery.  The patient was heparinized systemically and ACT verified > 250 seconds.    A 14 Fr transfemoral E-sheath was introduced into the right femoral artery after progressively dilating over an Amplatz superstiff wire. An AL-2 catheter was used to direct a straight-tip exchange length wire across the native aortic valve into the left ventricle. This was exchanged out for a pigtail catheter and position was confirmed in the LV apex. Simultaneous LV and Ao pressures were recorded.  The pigtail catheter was exchanged for an Amplatz Extra-stiff wire in the LV  apex.  Echocardiography was utilized to confirm appropriate wire position and no sign of entanglement in the mitral subvalvular apparatus.   BALLOON AORTIC  VALVULOPLASTY:   Not performed   TRANSCATHETER HEART VALVE DEPLOYMENT:   An Edwards Sapien 3 transcatheter heart valve (size 26 mm, model #9600TFX, serial #[id number]) was prepared and crimped per manufacturer's guidelines, and the proper orientation of the valve is confirmed on the [employer] delivery system. The valve was advanced through the introducer sheath using normal technique until in an appropriate position in the abdominal aorta beyond the sheath tip. The balloon was then retracted and using the fine-tuning wheel was centered on the valve. The valve was then advanced across the aortic arch using appropriate flexion of the catheter. The valve was carefully positioned across the aortic valve annulus. The Commander catheter was retracted using normal technique. Once final position of the valve has been confirmed by angiographic assessment, the valve is deployed while temporarily holding ventilation and during rapid ventricular pacing to maintain systolic blood pressure < 50 mmHg and pulse pressure < 10 mmHg. The balloon inflation is held for >3 seconds after reaching full deployment volume. Once the balloon has fully deflated the balloon is retracted into the ascending aorta and valve function is assessed using echocardiography. There is felt to be trace paravalvular leak and no central aortic insufficiency.  The patient's hemodynamic recovery following valve deployment is good.  The deployment balloon and guidewire are both removed.    PROCEDURE COMPLETION:   The sheath was removed and femoral artery closure performed using the previously placed Perclose devices.  Protamine was administered once femoral arterial repair was complete. The temporary pacemaker, pigtail catheters and femoral sheaths were removed with manual pressure used  for hemostasis.   The patient tolerated the procedure well and is transported to the surgical intensive care in stable condition. There were no immediate intraoperative complications. All sponge instrument and needle counts are verified correct at completion of the operation.   No blood products were administered during the operation.  The patient received a total of 60 mL of intravenous contrast during the procedure.   Gaye Pollack, MD 09/04/2017 2:41 PM

## 2017-09-04 NOTE — Transfer of Care (Signed)
Immediate Anesthesia Transfer of Care Note  Patient: Wayne [name]  Procedure(s) Performed: TRANSCATHETER AORTIC VALVE REPLACEMENT, TRANSFEMORAL (N/A Chest) TRANSESOPHAGEAL ECHOCARDIOGRAM (TEE) (N/A )  Patient Location: SICU  Anesthesia Type:MAC  Level of Consciousness: awake, alert  and oriented  Airway & Oxygen Therapy: Patient Spontanous Breathing and Patient connected to face mask oxygen  Post-op Assessment: Report given to RN and Post -op Vital signs reviewed and stable  Post vital signs: Reviewed and stable  Last Vitals:  Vitals:   09/04/17 0837 09/04/17 1109  BP: 115/74   Pulse: 87 70  Resp: 18   Temp: 36.8 C   SpO2: 98%     Last Pain:  Vitals:   09/04/17 0837  TempSrc: Oral  PainSc:          Complications: No apparent anesthesia complications

## 2017-09-04 NOTE — OR Nursing (Signed)
Forty-five minute call to SICU charge nurse at 1113. Spoke to [employer].

## 2017-09-04 NOTE — Anesthesia Procedure Notes (Signed)
Central Venous Catheter Insertion Performed by: Audry Pili, MD, anesthesiologist Start/End3/19/2019 9:13 AM, 09/04/2017 9:23 AM Patient location: Pre-op. Preanesthetic checklist: patient identified, IV checked, risks and benefits discussed, surgical consent, monitors and equipment checked, pre-op evaluation, timeout performed and anesthesia consent Lidocaine 1% used for infiltration and patient sedated Hand hygiene performed  and maximum sterile barriers used  Catheter size: 8 Fr Total catheter length 16. Central line was placed.Double lumen Procedure performed using ultrasound guided technique. Ultrasound Notes:image(s) printed for medical record Attempts: 1 Following insertion, dressing applied, line sutured and Biopatch. Post procedure assessment: blood return through all ports  Patient tolerated the procedure well with no immediate complications.

## 2017-09-04 NOTE — Progress Notes (Signed)
  Echocardiogram 2D Echocardiogram has been performed.  Wayne [name] 09/04/2017, 11:38 AM

## 2017-09-04 NOTE — Brief Op Note (Signed)
09/04/2017  11:55 AM  PATIENT:  [name]  84 y.o. male  PRE-OPERATIVE DIAGNOSIS:  Severe aortic stenosis  POST-OPERATIVE DIAGNOSIS:  Severe aortic stenosis  PROCEDURE:  Procedure(s): TRANSCATHETER AORTIC VALVE REPLACEMENT, TRANSFEMORAL (N/A) TRANSESOPHAGEAL ECHOCARDIOGRAM (TEE) (N/A)  SURGEON:  Surgeon(s) and Role:    Sherren Mocha, MD - Primary    * Bartle, Fernande Boyden, MD - Co-Surgeon (only for TAVR, EVAR, and Barostim procedure)  PHYSICIAN ASSISTANT: none    ANESTHESIA:   local and MAC  EBL:  5 mL   BLOOD ADMINISTERED:none  DRAINS: none   LOCAL MEDICATIONS USED:  LIDOCAINE   SPECIMEN:  No Specimen  DISPOSITION OF SPECIMEN:  N/A  COUNTS:  YES  TOURNIQUET:  * No tourniquets in log *  DICTATION: .Note written in EPIC  PLAN OF CARE: Admit to inpatient   PATIENT DISPOSITION:  ICU - extubated and stable.   Delay start of Pharmacological VTE agent (>24hrs) due to surgical blood loss or risk of bleeding: yes

## 2017-09-04 NOTE — Progress Notes (Signed)
      [address]       [place] [address]             [phone]      S/p TAVR BP 117/78   Pulse 75   Temp (!) 97 F (36.1 C) (Axillary)   Resp 16   Wt 170 lb (77.1 kg)   SpO2 99%   BMI 24.05 kg/m  4L [place]  Intake/Output Summary (Last 24 hours) at 09/04/2017 1759 Last data filed at 09/04/2017 1721 Gross per 24 hour  Intake 1368.71 ml  Output 5 ml  Net 1363.71 ml   Hct= 28  Doing well early postop  Wayne Lipps C. Roxan Hockey, MD [facility] [phone]

## 2017-09-04 NOTE — Anesthesia Postprocedure Evaluation (Signed)
Anesthesia Post Note  Patient: Kazuto Sevey  Procedure(s) Performed: TRANSCATHETER AORTIC VALVE REPLACEMENT, TRANSFEMORAL (N/A Chest) TRANSESOPHAGEAL ECHOCARDIOGRAM (TEE) (N/A )     Patient location during evaluation: PACU Anesthesia Type: MAC Level of consciousness: awake and alert Pain management: pain level controlled Vital Signs Assessment: post-procedure vital signs reviewed and stable Respiratory status: spontaneous breathing, nonlabored ventilation and respiratory function stable Cardiovascular status: stable and blood pressure returned to baseline Anesthetic complications: no    Last Vitals:  Vitals:   09/04/17 1345 09/04/17 1400  BP:  (!) 124/54  Pulse: 71 66  Resp: 12 14  Temp:    SpO2: 97% 96%    Last Pain:  Vitals:   09/04/17 0837  TempSrc: Oral  PainSc:                  Audry Pili

## 2017-09-04 NOTE — Plan of Care (Signed)
  Progressing Education: Ability to demonstrate proper wound care will improve 09/04/2017 1634 - Progressing by Jenne Campus, RN Knowledge of disease or condition will improve 09/04/2017 1634 - Progressing by Jenne Campus, RN Activity: Risk for activity intolerance will decrease 09/04/2017 1634 - Progressing by Jenne Campus, RN Clinical Measurements: Postoperative complications will be avoided or minimized 09/04/2017 1634 - Progressing by Jenne Campus, RN Respiratory: Respiratory status will improve 09/04/2017 1634 - Progressing by Jenne Campus, RN Skin Integrity: Wound healing without signs and symptoms of infection 09/04/2017 1634 - Progressing by Jenne Campus, RN Urinary Elimination: Ability to achieve and maintain adequate renal perfusion and functioning will improve 09/04/2017 1634 - Progressing by Jenne Campus, RN Note External catheter pt voids on his own

## 2017-09-05 ENCOUNTER — Inpatient Hospital Stay (HOSPITAL_COMMUNITY): Payer: Medicare HMO

## 2017-09-05 ENCOUNTER — Encounter (HOSPITAL_COMMUNITY): Payer: Self-pay | Admitting: Cardiovascular Disease

## 2017-09-05 DIAGNOSIS — I5033 Acute on chronic diastolic (congestive) heart failure: Secondary | ICD-10-CM

## 2017-09-05 DIAGNOSIS — I350 Nonrheumatic aortic (valve) stenosis: Secondary | ICD-10-CM

## 2017-09-05 DIAGNOSIS — I340 Nonrheumatic mitral (valve) insufficiency: Secondary | ICD-10-CM

## 2017-09-05 LAB — CBC
HEMATOCRIT: 30 % — AB (ref 39.0–52.0)
HEMOGLOBIN: 9.6 g/dL — AB (ref 13.0–17.0)
MCH: 28.4 pg (ref 26.0–34.0)
MCHC: 32 g/dL (ref 30.0–36.0)
MCV: 88.8 fL (ref 78.0–100.0)
Platelets: 180 10*3/uL (ref 150–400)
RBC: 3.38 MIL/uL — ABNORMAL LOW (ref 4.22–5.81)
RDW: 16.4 % — AB (ref 11.5–15.5)
WBC: 8.2 10*3/uL (ref 4.0–10.5)

## 2017-09-05 LAB — BASIC METABOLIC PANEL
Anion gap: 7 (ref 5–15)
BUN: 10 mg/dL (ref 6–20)
CALCIUM: 8.3 mg/dL — AB (ref 8.9–10.3)
CHLORIDE: 100 mmol/L — AB (ref 101–111)
CO2: 25 mmol/L (ref 22–32)
CREATININE: 0.7 mg/dL (ref 0.61–1.24)
GFR calc Af Amer: >60 mL/min (ref >60)
GFR calc non Af Amer: >60 mL/min (ref >60)
GLUCOSE: 111 mg/dL — AB (ref 65–99)
Potassium: 4.2 mmol/L (ref 3.5–5.1)
Sodium: 132 mmol/L — ABNORMAL LOW (ref 135–145)

## 2017-09-05 LAB — ECHOCARDIOGRAM COMPLETE: Weight: 2712.54 oz

## 2017-09-05 LAB — PROTIME-INR
INR: 1.22
Prothrombin Time: 15.3 seconds — ABNORMAL HIGH (ref 11.4–15.2)

## 2017-09-05 MED ORDER — CHLORHEXIDINE GLUCONATE CLOTH 2 % EX PADS
6.0000 | MEDICATED_PAD | Freq: Every day | CUTANEOUS | Status: DC
  Administered 2017-09-05: 6 via TOPICAL

## 2017-09-05 MED ORDER — ACETAMINOPHEN 325 MG PO TABS
650.0000 mg | ORAL_TABLET | Freq: Four times a day (QID) | ORAL | Status: DC | PRN
  Administered 2017-09-05 – 2017-09-06 (×3): 650 mg via ORAL
  Filled 2017-09-05 (×3): qty 2

## 2017-09-05 MED ORDER — PANTOPRAZOLE SODIUM 40 MG PO TBEC
40.0000 mg | DELAYED_RELEASE_TABLET | Freq: Every day | ORAL | Status: DC
  Administered 2017-09-05 – 2017-09-06 (×2): 40 mg via ORAL
  Filled 2017-09-05: qty 1

## 2017-09-05 MED ORDER — FUROSEMIDE 10 MG/ML IJ SOLN
20.0000 mg | Freq: Once | INTRAMUSCULAR | Status: AC
  Administered 2017-09-05: 20 mg via INTRAVENOUS
  Filled 2017-09-05: qty 2

## 2017-09-05 MED ORDER — WARFARIN - PHARMACIST DOSING INPATIENT
Freq: Every day | Status: DC
  Administered 2017-09-05: 1

## 2017-09-05 MED ORDER — WARFARIN SODIUM 5 MG PO TABS
5.0000 mg | ORAL_TABLET | Freq: Once | ORAL | Status: AC
  Administered 2017-09-05: 5 mg via ORAL
  Filled 2017-09-05: qty 1

## 2017-09-05 MED ORDER — SODIUM CHLORIDE 0.9% FLUSH: 10.0000 mL | INTRAVENOUS | Status: DC | PRN

## 2017-09-05 MED ORDER — SODIUM CHLORIDE 0.9% FLUSH: 10.0000 mL | Freq: Two times a day (BID) | INTRAVENOUS | Status: DC

## 2017-09-05 MED FILL — Heparin Sodium (Porcine) Inj 1000 Unit/ML: INTRAMUSCULAR | Qty: 30 | Status: AC

## 2017-09-05 MED FILL — Magnesium Sulfate Inj 50%: INTRAMUSCULAR | Qty: 10 | Status: AC

## 2017-09-05 MED FILL — Potassium Chloride Inj 2 mEq/ML: INTRAVENOUS | Qty: 40 | Status: AC

## 2017-09-05 NOTE — Progress Notes (Signed)
  Echocardiogram 2D Echocardiogram has been performed.  Wayne [name] F 09/05/2017, 10:05 AM

## 2017-09-05 NOTE — Progress Notes (Signed)
ANTICOAGULATION CONSULT NOTE - Initial Consult  Pharmacy Consult for coumadin Indication: atrial fibrillation  Allergies  Allergen Reactions  . Penicillins Rash and Other (See Comments)    PATIENT HAS HAD A PCN REACTION WITH IMMEDIATE RASH, FACIAL/TONGUE/THROAT SWELLING, SOB, OR LIGHTHEADEDNESS WITH HYPOTENSION:  #  #  #  YES  #  #  #   Has patient had a PCN reaction causing severe rash involving mucus membranes or skin necrosis: No Has patient had a PCN reaction that required hospitalization No Has patient had a PCN reaction occurring within the last 10 years: No If all of the above answers are "NO", then may proceed with Cephalosporin use.  . Prednisone Other (See Comments)    Wheezing  . Procaine Hcl Other (See Comments)    Passed out after being given this at dental appointment  . Levofloxacin Rash  . Oxycodone Other (See Comments)    constipation    Patient Measurements: Weight: 169 lb 8.5 oz (76.9 kg)  Vital Signs: Temp: 97.7 F (36.5 C) (03/20 0737) Temp Source: Oral (03/20 0737) BP: 108/50 (03/20 0800) Pulse Rate: 73 (03/20 0800)  Labs: Recent Labs    09/04/17 1111 09/04/17 1139 09/04/17 1220 09/04/17 1226 09/04/17 1227 09/05/17 0337  HGB 9.9* 9.2*  --  9.7* 9.5* 9.6*  HCT 29.0* 27.0*  --  29.7* 28.0* 30.0*  PLT  --   --   --  162  --  180  LABPROT  --   --  17.5*  --   --  15.3*  INR  --   --  1.45  --   --  1.22  CREATININE 0.70 0.70  --   --   --  0.70    Estimated Creatinine Clearance: 69.6 mL/min (by C-G formula based on SCr of 0.7 mg/dL).   Medical History: Past Medical History:  Diagnosis Date  . Aortic stenosis   . Arthritis   . Atrial fibrillation (HCC)    Chronic  . Atrial fibrillation, chronic ([place])   . BPH (benign prostatic hyperplasia)   . CAD (coronary artery disease)   . Chronic anticoagulation   . History of chicken pox   . HTN (hypertension)   . Mitral stenosis   . Old MI (myocardial infarction) 2007   s/p PCI to distal OM  (no stent)  . Severe mitral regurgitation   . Stroke, embolic [facility])    4401-0 weeks after his heart attack    Medications:  Medications Prior to Admission  Medication Sig Dispense Refill Last Dose  . acetaminophen (TYLENOL) 500 MG tablet Take 1,000 mg by mouth every 6 (six) hours as needed for moderate pain or headache.   09/04/2017 at Unknown time  . alfuzosin (UROXATRAL) 10 MG 24 hr tablet Take 10 mg by mouth daily with breakfast.   09/03/2017 at Unknown time  . ARTIFICIAL TEAR OP Place 1 drop into both eyes daily as needed (dry eyes).   09/03/2017 at Unknown time  . cholecalciferol 2000 units TABS Take 1 tablet (2,000 Units total) by mouth daily. 30 tablet 0 09/03/2017 at Unknown time  . Dextromethorphan-Guaifenesin (MUCINEX DM) 30-600 MG TB12 Take 1-2 tablets by mouth every 12 (twelve) hours as needed (for cough/congestion.).   Past Week at Unknown time  . digoxin (LANOXIN) 0.125 MG tablet TAKE ONE TABLET BY MOUTH ONCE DAILY (Patient taking differently: TAKE ONE TABLET (135mcg) BY MOUTH ONCE DAILY) 90 tablet 3 09/03/2017 at Unknown time  . finasteride (PROSCAR) 5 MG tablet Take  5 mg by mouth at bedtime.    09/03/2017 at Unknown time  . furosemide (LASIX) 20 MG tablet Take 80 mg by mouth daily.   09/03/2017 at Unknown time  . Melatonin 1 MG TABS Take 2 mg by mouth at bedtime.    09/03/2017 at Unknown time  . Menthol, Topical Analgesic, (BLUE-EMU MAXIMUM STRENGTH EX) Apply 1 application topically 3 (three) times daily as needed (muscle pain in back). alterrnates between [employer] and Blu Emu   09/03/2017 at Unknown time  . methocarbamol (ROBAXIN) 500 MG tablet Take 500 mg by mouth daily as needed (cramps).    Past Week at Unknown time  . metoprolol tartrate (LOPRESSOR) 25 MG tablet TAKE ONE TABLET (25mg ) BY MOUTH TWICE DAILY. (Patient taking differently: Take 25 mg by mouth 2 (two) times daily. ) 135 tablet 3 09/03/2017 at Unknown time  . polyethylene glycol (MIRALAX / GLYCOLAX) packet Take 17 g by  mouth daily.    09/03/2017 at Unknown time  . potassium chloride (K-DUR) 10 MEQ tablet Take 1 tablet (10 mEq total) by mouth daily as needed. Take potasium if you take lasix. (Patient taking differently: Take 10 mEq by mouth See admin instructions. Take 1 tablet (10 meq) twice daily for 3 days (beginning 08/24/2017) then return to 1 tablet (10 meq) daily in the morning) 30 tablet 2 09/03/2017 at Unknown time  . sodium chloride (OCEAN) 0.65 % SOLN nasal spray Place 1 spray into both nostrils daily as needed for congestion.   Past Week at Unknown time  . triamcinolone cream (KENALOG) 0.1 % Apply 1 application topically daily as needed (dry skin).    09/03/2017 at Unknown time  . warfarin (COUMADIN) 2.5 MG tablet TAKE 1 TO 2 TABLETS BY MOUTH ONCE DAILY AS DIRECTED BY  COUMADIN  CLINIC (Patient taking differently: Take 2.5-5 mg by mouth See admin instructions. TAKE 1 TABLET (2.5 MG) BY MOUTH ON TUESDAYS AND SATURDAYS TAKE 2 TABLETS (5 MG) BY MOUTH ON SUNDAY, MONDAY, WEDNESDAYS, THURSDAYS, AND FRIDAYS.) 180 tablet 0 Taking  . calcium carbonate (TUMS - DOSED IN MG ELEMENTAL CALCIUM) 500 MG chewable tablet Chew 1-2 tablets by mouth daily as needed for indigestion or heartburn.   Unknown at Unknown time  . Menthol, Topical Analgesic, (BIOFREEZE EX) Apply 1 application topically 3 (three) times daily as needed (muscle pain in back). alterrnates between [employer] and [employer]mu   Unknown at Unknown time   Scheduled:  . alfuzosin  10 mg Oral Q breakfast  . aspirin EC  81 mg Oral Daily   Or  . aspirin  81 mg Per Tube Daily  . Chlorhexidine Gluconate Cloth  6 each Topical Daily  . digoxin  0.125 mg Oral Daily  . finasteride  5 mg Oral QHS  . mouth rinse  15 mL Mouth Rinse BID  . [START ON 09/06/2017] pantoprazole  40 mg Oral Daily  . sodium chloride flush  10-40 mL Intracatheter Q12H    Assessment: 84 yo male s/p TAVR on coumadin PTA for afib. Pharmacy consulted to restart.  -INR= 1.22  Home dose: 5mg /day  except take 2.5mg  TuSa (last clinic visit on 3/11; INR was 2.8)  Goal of Therapy:  INR 2-3 Monitor platelets by anticoagulation protocol: Yes   Plan:  -Coumadin 5mg  po today -Daily PT/INR  Hildred Laser, PharmD Clinical Pharmacist Clinical phone from 8:30-4:00 is [phone] After 4pm, please call Main Rx [phone]) for assistance. 09/05/2017 10:25 AM

## 2017-09-05 NOTE — Progress Notes (Signed)
CARDIAC REHAB PHASE I   PRE:  Rate/Rhythm: 75 afib    BP: sitting 99/50    SaO2: 97 RA  MODE:  Ambulation: 200 ft   POST:  Rate/Rhythm: 117 afib    BP: sitting 109/47     SaO2: 89 RA, 94 RA  Pt stood mostly independently and used RW in hall. His knees and legs are stiff. Used RW, gait belt, assist x2. No major c/o. To bed after walk, VSS. He needs to use RW all the time. Pt sts he has a three story house and that he uses the RW "upstairs". PT to access tomorrow.  [place], ACSM 09/05/2017 3:22 PM

## 2017-09-05 NOTE — Care Management Note (Signed)
Case Management Note [name] RN, BSN Unit 4E-Case Manager- [place] coverage [phone]   Patient Details  Name: Wayne [name] MRN: [id number] Date of Birth: 1930/03/18  Subjective/Objective:  Pt admitted s/p TAVR                  Action/Plan: PTA pt lived at home with spouse- PT eval pending, CM to follow for recommendations and transition of care needs.   Expected Discharge Date:                  Expected Discharge Plan:     In-House Referral:     Discharge planning Services  CM Consult  Post Acute Care Choice:    Choice offered to:     DME Arranged:    DME Agency:     HH Arranged:    HH Agency:     Status of Service:  In process, will continue to follow  If discussed at Long Length of Stay Meetings, dates discussed:    Discharge Disposition:   Additional Comments:  Dawayne Patricia, RN 09/05/2017, 11:25 AM

## 2017-09-05 NOTE — Progress Notes (Signed)
Progress Note  Patient Name: Wayne [name] Date of Encounter: 09/05/2017  Primary Cardiologist: Kirk Ruths, MD   Subjective   Feels like he is breathing better already.  No chest pain.  No specific complaints.  Inpatient Medications    Scheduled Meds: . alfuzosin  10 mg Oral Q breakfast  . aspirin EC  81 mg Oral Daily   Or  . aspirin  81 mg Per Tube Daily  . digoxin  0.125 mg Oral Daily  . finasteride  5 mg Oral QHS  . mouth rinse  15 mL Mouth Rinse BID  . [START ON 09/06/2017] pantoprazole  40 mg Oral Daily   Continuous Infusions: . sodium chloride 50 mL/hr at 09/04/17 1900  . albumin human    .  ceFAZolin (ANCEF) IV 2 g (09/05/17 0748)  . famotidine (PEPCID) IV     PRN Meds: acetaminophen, albumin human, guaiFENesin, ondansetron (ZOFRAN) IV, traMADol   Vital Signs    Vitals:   09/05/17 0600 09/05/17 0700 09/05/17 0737 09/05/17 0800  BP: 106/64 (!) 107/54  (!) 108/50  Pulse: 72 73  73  Resp: 18 15  15   Temp:   97.7 F (36.5 C)   TempSrc:   Oral   SpO2: 95% 97%  98%  Weight:        Intake/Output Summary (Last 24 hours) at 09/05/2017 0951 Last data filed at 09/05/2017 0800 Gross per 24 hour  Intake 3089.8 ml  Output 1270 ml  Net 1819.8 ml   Filed Weights   09/04/17 0837 09/05/17 0500  Weight: 170 lb (77.1 kg) 169 lb 8.5 oz (76.9 kg)    Telemetry    Atrial fibrillation, heart rate controlled- Personally Reviewed  ECG    Atrial fibrillation heart rate 66 bpm, LVH, nonspecific T wave abnormality- Personally Reviewed  Physical Exam  Pleasant elderly male in no distress GEN: No acute distress.   Neck:  JVP mildly elevated Cardiac:  Irregular with grade 3/6 holosystolic murmur at the apex Respiratory:  Mild crackles in the lung bases otherwise clear bilaterally. GI: Soft, nontender, non-distended  MS:  1+ ankle edema; No deformity.  Bilateral groin sites clear Neuro:  Nonfocal  Psych: Normal affect   Labs    Chemistry Recent Labs    Lab 08/31/17 1426  09/04/17 1111 09/04/17 1139 09/04/17 1227 09/05/17 0337  NA 133*   < > 136 137 137 132*  K 4.1   < > 3.6 3.7 3.9 4.2  CL 100*   < > 96* 97*  --  100*  CO2 24  --   --   --   --  25  GLUCOSE 93   < > 94 99 100* 111*  BUN 15   < > 13 13  --  10  CREATININE 0.86   < > 0.70 0.70  --  0.70  CALCIUM 8.7*  --   --   --   --  8.3*  PROT 6.8  --   --   --   --   --   ALBUMIN 3.5  --   --   --   --   --   AST 20  --   --   --   --   --   ALT 11*  --   --   --   --   --   ALKPHOS 52  --   --   --   --   --   BILITOT 1.0  --   --   --   --   --  GFRNONAA >60  --   --   --   --  >60  GFRAA >60  --   --   --   --  >60  ANIONGAP 9  --   --   --   --  7   < > = values in this interval not displayed.     Hematology Recent Labs  Lab 08/31/17 1426  09/04/17 1226 09/04/17 1227 09/05/17 0337  WBC 6.8  --  9.8  --  8.2  RBC 3.58*  --  3.33*  --  3.38*  HGB 10.5*   < > 9.7* 9.5* 9.6*  HCT 32.3*   < > 29.7* 28.0* 30.0*  MCV 90.2  --  89.2  --  88.8  MCH 29.3  --  29.1  --  28.4  MCHC 32.5  --  32.7  --  32.0  RDW 17.1*  --  16.8*  --  16.4*  PLT 195  --  162  --  180   < > = values in this interval not displayed.    Cardiac EnzymesNo results for input(s): TROPONINI in the last 168 hours. No results for input(s): TROPIPOC in the last 168 hours.   BNP Recent Labs  Lab 08/31/17 1427  BNP 343.3*     DDimer No results for input(s): DDIMER in the last 168 hours.   Radiology    Dg Chest Port 1 View  Result Date: 09/04/2017 CLINICAL DATA:  Status post trans catheter aortic valve replacement. EXAM: PORTABLE CHEST 1 VIEW COMPARISON:  Portable chest x-ray of August 31, 2017 FINDINGS: The lungs are well-expanded. A prosthetic aortic valve cage is visible. The cardiac silhouette is mildly enlarged. The pulmonary vascularity is engorged and indistinct and the pulmonary interstitial markings are increased. There is no pleural effusion or pneumothorax. There is dense  calcification in the wall of the thoracic aorta. The right internal jugular venous catheter tip projects over the junction of the proximal and midportions of the SVC. IMPRESSION: Moderate pulmonary interstitial edema following transcatheter aortic valve replacement. Thoracic aortic atherosclerosis. Electronically Signed   By: David  Martinique M.D.   On: 09/04/2017 12:52    Cardiac Studies   Postoperative day #1 echo study pending  Patient Profile     84 y.o. male with permanent atrial fibrillation, severe mitral regurgitation, and severe aortic stenosis who presents for elective TAVR September 04, 2017  Assessment & Plan    1.  Severe aortic stenosis status post TAVR: Await today's echo result.  Mobilize, PT/OT consult, resume warfarin and aspirin 81 mg daily  2.  Acute on chronic diastolic heart failure: Diuresis with IV Lasix today as he has interstitial edema on chest x-ray and mild volume overload.  Patient has weaned off of vasopressor support.  Clinically stable.  3.  Permanent atrial fibrillation: Heart rate well controlled.  Anticoagulation with warfarin.  4.  Severe mitral regurgitation: Medical therapy planned   Dispo: transfer tele today.   For questions or updates, please contact [place] Please consult [email or web address] for contact info under Cardiology/STEMI.     Signed, Sherren Mocha, MD  09/05/2017, 9:51 AM

## 2017-09-06 ENCOUNTER — Other Ambulatory Visit: Payer: Self-pay

## 2017-09-06 ENCOUNTER — Encounter: Payer: Self-pay | Admitting: Thoracic Surgery (Cardiothoracic Vascular Surgery)

## 2017-09-06 DIAGNOSIS — Z952 Presence of prosthetic heart valve: Secondary | ICD-10-CM

## 2017-09-06 DIAGNOSIS — I350 Nonrheumatic aortic (valve) stenosis: Secondary | ICD-10-CM

## 2017-09-06 LAB — CBC
HEMATOCRIT: 30.9 % — AB (ref 39.0–52.0)
Hemoglobin: 9.8 g/dL — ABNORMAL LOW (ref 13.0–17.0)
MCH: 28.5 pg (ref 26.0–34.0)
MCHC: 31.7 g/dL (ref 30.0–36.0)
MCV: 89.8 fL (ref 78.0–100.0)
PLATELETS: 180 10*3/uL (ref 150–400)
RBC: 3.44 MIL/uL — ABNORMAL LOW (ref 4.22–5.81)
RDW: 16.6 % — AB (ref 11.5–15.5)
WBC: 8.6 10*3/uL (ref 4.0–10.5)

## 2017-09-06 LAB — BASIC METABOLIC PANEL
Anion gap: 10 (ref 5–15)
BUN: 16 mg/dL (ref 6–20)
CALCIUM: 8.1 mg/dL — AB (ref 8.9–10.3)
CO2: 26 mmol/L (ref 22–32)
CREATININE: 0.81 mg/dL (ref 0.61–1.24)
Chloride: 98 mmol/L — ABNORMAL LOW (ref 101–111)
GFR calc Af Amer: >60 mL/min (ref >60)
GFR calc non Af Amer: >60 mL/min (ref >60)
GLUCOSE: 86 mg/dL (ref 65–99)
Potassium: 4 mmol/L (ref 3.5–5.1)
Sodium: 134 mmol/L — ABNORMAL LOW (ref 135–145)

## 2017-09-06 LAB — PROTIME-INR
INR: 1.22
Prothrombin Time: 15.3 seconds — ABNORMAL HIGH (ref 11.4–15.2)

## 2017-09-06 MED ORDER — METOPROLOL TARTRATE 25 MG PO TABS: 12.5000 mg | ORAL_TABLET | Freq: Two times a day (BID) | ORAL | 6 refills | Status: DC

## 2017-09-06 MED ORDER — FUROSEMIDE 40 MG PO TABS
40.0000 mg | ORAL_TABLET | Freq: Every day | ORAL | Status: DC
  Administered 2017-09-06: 40 mg via ORAL
  Filled 2017-09-06: qty 1

## 2017-09-06 MED ORDER — METOPROLOL TARTRATE 12.5 MG HALF TABLET
12.5000 mg | ORAL_TABLET | Freq: Two times a day (BID) | ORAL | Status: DC
  Administered 2017-09-06: 12.5 mg via ORAL
  Filled 2017-09-06: qty 1

## 2017-09-06 MED ORDER — POTASSIUM CHLORIDE CRYS ER 10 MEQ PO TBCR
10.0000 meq | EXTENDED_RELEASE_TABLET | Freq: Every day | ORAL | Status: DC
  Administered 2017-09-06: 10 meq via ORAL
  Filled 2017-09-06: qty 1

## 2017-09-06 MED ORDER — FUROSEMIDE 40 MG PO TABS: 40.0000 mg | ORAL_TABLET | Freq: Every day | ORAL | 3 refills | Status: DC

## 2017-09-06 MED ORDER — POTASSIUM CHLORIDE CRYS ER 10 MEQ PO TBCR: 10.0000 meq | EXTENDED_RELEASE_TABLET | Freq: Every day | ORAL | 3 refills | Status: DC

## 2017-09-06 MED ORDER — ASPIRIN 81 MG PO TBEC: 81.0000 mg | DELAYED_RELEASE_TABLET | Freq: Every day | ORAL | 2 refills | Status: AC

## 2017-09-06 NOTE — Discharge Summary (Addendum)
Discharge Summary    Patient ID: Wayne [name],  MRN: [id number], DOB/AGE: 09/12/1929 84 y.o.  Admit date: 09/04/2017 Discharge date: 09/06/2017  Primary Care Provider: [name] Primary Cardiologist: Kirk Ruths, MD  Discharge Diagnoses    Principal Problem:   Severe aortic stenosis Active Problems:   Chronic atrial fibrillation (HCC)   Chronic anticoagulation   HTN (hypertension)   CAD (coronary artery disease)   History of stroke   BPH with obstruction/lower urinary tract symptoms   Acute on chronic diastolic heart failure (HCC)   Severe mitral regurgitation   S/P TAVR (transcatheter aortic valve replacement)  Allergies Allergies  Allergen Reactions  . Penicillins Rash and Other (See Comments)    PATIENT HAS HAD A PCN REACTION WITH IMMEDIATE RASH, FACIAL/TONGUE/THROAT SWELLING, SOB, OR LIGHTHEADEDNESS WITH HYPOTENSION:  #  #  #  YES  #  #  #   Has patient had a PCN reaction causing severe rash involving mucus membranes or skin necrosis: No Has patient had a PCN reaction that required hospitalization No Has patient had a PCN reaction occurring within the last 10 years: No If all of the above answers are "NO", then may proceed with Cephalosporin use.  . Prednisone Other (See Comments)    Wheezing  . Procaine Hcl Other (See Comments)    Passed out after being given this at dental appointment  . Levofloxacin Rash  . Oxycodone Other (See Comments)    constipation    Diagnostic Studies/Procedures    POD #1 Echo: Left ventricle: The cavity size was normal. Wall thickness was increased in a pattern of moderate LVH. Systolic function was normal. The estimated ejection fraction was in the range of 60% to 65%. Wall motion was normal; there were no regional wall motion abnormalities.  ------------------------------------------------------------------- Aortic valve: The TAVR appears to be functioning normally . A bioprosthesis was present.  Doppler: VTI ratio of LVOT to aortic valve: 0.45. Peak velocity ratio of LVOT to aortic valve: 0.37. Mean velocity ratio of LVOT to aortic valve: 0.39. Mean gradient (S): 7 mm Hg. Peak gradient (S): 16 mm Hg.  ------------------------------------------------------------------- Aorta: Aortic root: The aortic root was normal in size. Ascending aorta: The ascending aorta was normal in size.  ------------------------------------------------------------------- Mitral valve: The valve is heavily calcified. There is a mobile , calcified density that appears to be a pruptured chordae or perhaps a small partially flail leaflet. The mitral regurgitation was severe in the pre-TAVR echo but appears to be somewhat better today. PISA was not measured in this echo. He still has at least moderate MR. Moderately calcified annulus. Moderately thickened, moderately calcified leaflets . Doppler: The findings are consistent with moderate to severe stenosis. There was mild to moderate regurgitation. Valve area by pressure half-time: 2.18 cm^2. Indexed valve area by pressure half-time: 1.11 cm^2/m^2. Mean gradient (D): 10 mm Hg.  ------------------------------------------------------------------- Left atrium: The atrium was severely dilated.  ------------------------------------------------------------------- Right ventricle: The cavity size was normal. Systolic function was normal.  ------------------------------------------------------------------- Pulmonic valve: The valve appears to be grossly normal. Doppler: There was no significant regurgitation.  ------------------------------------------------------------------- Tricuspid valve: The valve appears to be grossly normal. Doppler: There was mild-moderate regurgitation.  ------------------------------------------------------------------- Pulmonary artery: Systolic pressure was moderately  increased.  ------------------------------------------------------------------- Right atrium: The atrium was severely dilated.  ------------------------------------------------------------------- Pericardium: There was no pericardial effusion.  ------------------------------------------------------------------- Measurements  LVOT Value Reference LVOT peak velocity, S 73.4 cm/s ---------- LVOT mean velocity, S 44.7 cm/s ---------- LVOT VTI, S 15.9 cm ----------  LVOT peak gradient, S 2 mm Hg ----------  Aortic valve Value Reference Aortic valve peak velocity, S 198 cm/s ---------- Aortic valve mean velocity, S 116 cm/s ---------- Aortic valve VTI, S 35.3 cm ---------- Aortic mean gradient, S 7 mm Hg ---------- Aortic peak gradient, S 16 mm Hg ---------- VTI ratio, LVOT/AV 0.45 ---------- Velocity ratio, peak, LVOT/AV 0.37 ---------- Velocity ratio, mean, LVOT/AV 0.39 ----------  Left atrium Value Reference LA volume, S 165 ml ---------- LA volume/bsa, S 84.2 ml/m^2 ---------- LA volume, ES, 1-p A4C 156 ml ---------- LA volume/bsa, ES, 1-p A4C 79.6 ml/m^2 ---------- LA volume, ES, 1-p A2C 165 ml ---------- LA volume/bsa, ES, 1-p A2C 84.2 ml/m^2 ----------  Mitral valve Value Reference Mitral mean velocity, D 140 cm/s ---------- Mitral  pressure half-time 101 ms ---------- Mitral mean gradient, D 10 mm Hg ---------- Mitral valve area, PHT, DP 2.18 cm^2 ---------- Mitral valve area/bsa, PHT, DP 1.11 cm^2/m^2 ---------- Mitral annulus VTI, D 59.2 cm ---------- Mitral maximal regurg velocity, 485 cm/s ---------- PISA Mitral regurg VTI, PISA 152 cm ----------  Pulmonary arteries Value Reference PA pressure, S, DP (H) 49 mm Hg <=30  Tricuspid valve Value Reference Tricuspid regurg peak velocity 293 cm/s ---------- Tricuspid peak RV-RA gradient 34 mm Hg ----------  Right atrium Value Reference RA ID, S-I, ES, A4C (H) 67.6 mm 34 - 49 RA area, ES, A4C (H) 35 cm^2 8.3 - 19.5 RA volume, ES, A/L 137 ml ---------- RA volume/bsa, ES, A/L 69.9 ml/m^2 ----------  Systemic veins Value Reference Estimated CVP 15 mm Hg ----------  Right ventricle Value Reference RV ID, minor axis, ED, A4C base 48.1 mm ---------- RV ID, minor axis, ED, A4C mid 34.3 mm ---------- RV ID, major axis, ED, A4C (L) 53.3 mm 55 - 91 TAPSE 17.5 mm ---------- RV pressure, S, DP (H) 49 mm Hg <=30 RV s&', lateral, S 8.18 cm/s ----------  History of Present Illness     Mr. Bontrager is an 84 yo M with a hx of HTN, CAD s/p remote MI and balloon angioplasty to a distal OM branch with no  stenting?? In 2007, permanent atrial fibrillation on Coumadin, previous embolic stroke (9390), BPH with bladder outlet obstruction, severe degenerative arthritis with chronic back and leg pain, mitral regurgitation and mitral stenosis, as well as severe aortic stenosis.   In December 2018, the pt was hospitalized with urinary retention and had an acute exacerbation of chronic diastolic heart failure with shortness of breath at rest and severe lower extremity edema. An echo at that time revealed a tri-leaflet aortic valve that was severely thickened and calcified with restrictedly mobility.The mean gradient was 44 mmHg with a peak gradient of 80 mmHg. The mitral valve had a severely calcified annulus with restricted mobility of the leaflets and a flail segment of the anterior leaflet. There was severe regurgitation directed eccentrically and posteriorly. The mean gradient across the mitral valve was 11 mmHg with a peak gradient of 19 mmHg. Left atrium was severely dilated. Left ventricular systolic function was normal. There was severe focal basal hypertrophy of the septum and no wall motion abnormalities. Ultimately, he improved and he was sent home.   A follow up TEE on 07/20/17 showed an LVEF of 55-60% with hypokinesis of the inferolateral wall, severe aortic stenosis with a mean transvalvular gradient of 56 mmHg and severe mitral regurgitation with what appeared to be a ruptured chordae to the anterior leaflet. There was severe atrial enlargement, however no thrombus present.  At that  point, he was referred to Dr. Burt Knack. He was seen by Dr. Burt Knack in the office on 07/26/17 with continued reports of progressive exertional dyspnea with limited low-level activity, orthopnea and LE swelling. Plans were made for cardiac cath with TAVR procedure to follow secondary to pt advanced age, prior stroke and other co-morbid conditions. The patient subsequently underwent cardiac catheterization on 08/01/17 which showed mild  non-obstructive CAD with moderate pulmonary HTN with a mean PA pressure of 25mmHg with tracings consistent with severe mitral regurgitation.  On 09/04/17 the patient underwent an elective transcatheter valve replacement (TAVR) per Dr. Burt Knack and Dr. Cyndia Bent without complication.   Plan is to continue ASA 81mg  for 3 months. Resume home Rx of Lasix 40mg  PO QD, reduce metoprolol to 12.5mg  BID secondary to soft BP at discharge day. Discussed plan for resumption of Coumadin including dose and INR check. INR at discharge day, 09/06/17=1.22. Per inpatient pharmacist, pt will resume home dose of Coumadin 5mg  daily on Mondays, Wednesdays, Thursdays, Firdays, and Sundays. He will to take 2.5mg  daily on Tuesday and Saturdays. INR check is scheduled for Monday 09/10/17 at the [facility] office.  Post-op echo stable without pericardial effusion. PT/OT to see pt prior to discharge.   Pt was seen and examined by Dr. Burt Knack who feels that he is stable and ready for discharge today, 09/06/17.   Hospital Course     Consultants: None _____________  Discharge Vitals Blood pressure 131/69, pulse 86, temperature 98.1 F (36.7 C), temperature source Oral, resp. rate 12, weight 168 lb 6.9 oz (76.4 kg), SpO2 96 %.  Filed Weights   09/04/17 0837 09/05/17 0500 09/06/17 0700  Weight: 170 lb (77.1 kg) 169 lb 8.5 oz (76.9 kg) 168 lb 6.9 oz (76.4 kg)    Labs & Radiologic Studies    CBC Recent Labs    09/05/17 0337 09/06/17 0259  WBC 8.2 8.6  HGB 9.6* 9.8*  HCT 30.0* 30.9*  MCV 88.8 89.8  PLT 180 858   Basic Metabolic Panel Recent Labs    09/05/17 0337 09/06/17 0259  NA 132* 134*  K 4.2 4.0  CL 100* 98*  CO2 25 26  GLUCOSE 111* 86  BUN 10 16  CREATININE 0.70 0.81  CALCIUM 8.3* 8.1*   _____________  Dg Chest 2 View  Result Date: 08/31/2017 CLINICAL DATA:  Severe aortic stenosis. Pre operative respiratory exam. EXAM: CHEST - 2 VIEW COMPARISON:  Chest x-ray dated 07/02/2017 FINDINGS: Heart size and  pulmonary vascularity are normal. Chronic accentuation of the interstitial markings at the bases. No effusions. No acute bone abnormality. Aortic atherosclerosis. IMPRESSION: No acute abnormalities.  Slight chronic interstitial disease. Aortic Atherosclerosis (ICD10-I70.0). Electronically Signed   By: [name] M.D.   On: 08/31/2017 16:42   Dg Chest Port 1 View  Result Date: 09/04/2017 CLINICAL DATA:  Status post trans catheter aortic valve replacement. EXAM: PORTABLE CHEST 1 VIEW COMPARISON:  Portable chest x-ray of August 31, 2017 FINDINGS: The lungs are well-expanded. A prosthetic aortic valve cage is visible. The cardiac silhouette is mildly enlarged. The pulmonary vascularity is engorged and indistinct and the pulmonary interstitial markings are increased. There is no pleural effusion or pneumothorax. There is dense calcification in the wall of the thoracic aorta. The right internal jugular venous catheter tip projects over the junction of the proximal and midportions of the SVC. IMPRESSION: Moderate pulmonary interstitial edema following transcatheter aortic valve replacement. Thoracic aortic atherosclerosis. Electronically Signed   By: David  Martinique M.D.   On: 09/04/2017  12:52   Disposition   Pt is being discharged home today in good condition.  Follow-up Plans & Appointments    Follow-up Information    Almyra Deforest, [place] Follow up on 09/17/2017.   Specialties:  Cardiology, Radiology Why:  Your appointment is on 09/17/17 with Almyra Deforest at [place]. Please arrive to your appointment at 0945am.  Contact information: [place] [address] [address] [place] Follow up on 09/10/2017.   Specialty:  Cardiology Why:  Your Coumadin follow up will be on 09/10/17 at 1045am at the [facility] office.  Contact information: [address] [place] [phone]         Discharge Instructions    Call MD for:   redness, tenderness, or signs of infection (pain, swelling, redness, odor or green/yellow discharge around incision site)   Complete by:  As directed    Call MD for:  temperature >100.4   Complete by:  As directed    Diet - low sodium heart healthy   Complete by:  As directed    Discharge instructions   Complete by:  As directed    Please resume your Coumadin dose tonight as taken previously.   No driving for until seen in the clinic. No lifting over 5 lbs for 1 week. No sexual activity for 1 week. Keep procedure site clean & dry. If you notice increased pain, swelling, bleeding or pus, call/return!  You may shower, but no soaking baths/hot tubs/pools for 1 week.   Increase activity slowly   Complete by:  As directed       Discharge Medications   Allergies as of 09/06/2017      Reactions   Penicillins Rash, Other (See Comments)   PATIENT HAS HAD A PCN REACTION WITH IMMEDIATE RASH, FACIAL/TONGUE/THROAT SWELLING, SOB, OR LIGHTHEADEDNESS WITH HYPOTENSION:  #  #  #  YES  #  #  #   Has patient had a PCN reaction causing severe rash involving mucus membranes or skin necrosis: No Has patient had a PCN reaction that required hospitalization No Has patient had a PCN reaction occurring within the last 10 years: No If all of the above answers are "NO", then may proceed with Cephalosporin use.   Prednisone Other (See Comments)   Wheezing   Procaine Hcl Other (See Comments)   Passed out after being given this at dental appointment   Levofloxacin Rash   Oxycodone Other (See Comments)   constipation      Medication List    STOP taking these medications   potassium chloride 10 MEQ tablet Commonly known as:  K-DUR     TAKE these medications   acetaminophen 500 MG tablet Commonly known as:  TYLENOL Take 1,000 mg by mouth every 6 (six) hours as needed for moderate pain or headache.   alfuzosin 10 MG 24 hr tablet Commonly known as:  UROXATRAL Take 10 mg by mouth daily with breakfast.    ARTIFICIAL TEAR OP Place 1 drop into both eyes daily as needed (dry eyes).   aspirin 81 MG EC tablet Take 1 tablet (81 mg total) by mouth daily. Start taking on:  09/07/2017   BIOFREEZE EX Apply 1 application topically 3 (three) times daily as needed (muscle pain in back). alterrnates between [employer] and Blu Emu   BLUE-EMU MAXIMUM STRENGTH EX Apply 1 application topically 3 (three) times daily as needed (muscle pain in back). alterrnates  between Bio Freeze and Blu Emu   calcium carbonate 500 MG chewable tablet Commonly known as:  TUMS - dosed in mg elemental calcium Chew 1-2 tablets by mouth daily as needed for indigestion or heartburn.   Cholecalciferol 2000 units Tabs Take 1 tablet (2,000 Units total) by mouth daily.   digoxin 0.125 MG tablet Commonly known as:  LANOXIN TAKE ONE TABLET BY MOUTH ONCE DAILY What changed:    how much to take  how to take this  when to take this   finasteride 5 MG tablet Commonly known as:  PROSCAR Take 5 mg by mouth at bedtime.   furosemide 40 MG tablet Commonly known as:  LASIX Take 1 tablet (40 mg total) by mouth daily. Start taking on:  09/07/2017 What changed:    medication strength  how much to take   Melatonin 1 MG Tabs Take 2 mg by mouth at bedtime.   methocarbamol 500 MG tablet Commonly known as:  ROBAXIN Take 500 mg by mouth daily as needed (cramps).   metoprolol tartrate 25 MG tablet Commonly known as:  LOPRESSOR Take 0.5 tablets (12.5 mg total) by mouth 2 (two) times daily. What changed:    how much to take  how to take this  when to take this  additional instructions   MUCINEX DM 30-600 MG Tb12 Take 1-2 tablets by mouth every 12 (twelve) hours as needed (for cough/congestion.).   polyethylene glycol packet Commonly known as:  MIRALAX / GLYCOLAX Take 17 g by mouth daily.   potassium chloride 10 MEQ tablet Commonly known as:  K-DUR,KLOR-CON Take 1 tablet (10 mEq total) by mouth daily. Start taking  on:  09/07/2017   sodium chloride 0.65 % Soln nasal spray Commonly known as:  OCEAN Place 1 spray into both nostrils daily as needed for congestion.   triamcinolone cream 0.1 % Commonly known as:  KENALOG Apply 1 application topically daily as needed (dry skin).   warfarin 2.5 MG tablet Commonly known as:  COUMADIN Take as directed. If you are unsure how to take this medication, talk to your nurse or doctor. Original instructions:  TAKE 1 TO 2 TABLETS BY MOUTH ONCE DAILY AS DIRECTED BY  COUMADIN  CLINIC What changed:    how much to take  how to take this  when to take this  additional instructions       Outstanding Labs/Studies   Will need a BMET during follow up appointment   Duration of Discharge Encounter   Greater than 30 minutes including physician time.  SignedKathyrn Drown NP 09/06/2017, 11:26 AM

## 2017-09-06 NOTE — Discharge Instructions (Signed)
ACTIVITY AND EXERCISE °• Daily activity and exercise are an important part of your recovery. People recover at different rates depending on their general health and type of valve procedure. °• Most people require six to 10 weeks to feel recovered. °• No lifting, pushing, pulling more than 10 pounds (examples to avoid: groceries, vacuuming, gardening, golfing): °            - For one week with a procedure through the groin. °            - For six weeks for procedures through the chest wall. °            - For three months for procedures through the breast-bone. °NOTE: You will typically see one of our providers 7-10 days after your procedure to discuss WHEN TO RESUME the above activities.  °  °  °DRIVING °• Do not drive for four weeks after the date of your procedure. °• If you have been told by your doctor in the past that you may not drive, you must talk with him/her before you begin driving again. °• When you resume driving, you must have someone with you. °  °  °DRESSING °• Groin site: you may leave the clear dressing over the site for up to one week or until it falls off. °  °  °HYGIENE °• If you had a femoral (leg) procedure, you may take a shower when you return home. After the shower, pat the site dry. Do NOT use powder, oils or lotions in your groin area until the site has completely healed. °• If you had a chest procedure, you may shower when you return home unless specifically instructed not to by your discharging practitioner. °            - DO NOT scrub incision; pat dry with a towel °            - DO NOT apply any lotions, oils, powders to the incision °            - No tub baths / swimming for at least six weeks. °• If you notice any fevers, chills, increased pain, swelling, bleeding or pus, please contact your doctor. °  °ADDITIONAL INFORMATION °• If you are going to have an upcoming dental procedure, please contact our office as you may require antibiotics ahead of time to prevent infection on your  heart valve.  °  °

## 2017-09-06 NOTE — Progress Notes (Signed)
Progress Note  Patient Name: Wayne [name] Date of Encounter: 09/06/2017  Primary Cardiologist: Kirk Ruths, MD   Subjective   Feeling well this morning. No CP, breating comfortable at rest and did well with walking. Feels much better since TAVR.   Inpatient Medications    Scheduled Meds: . alfuzosin  10 mg Oral Q breakfast  . aspirin EC  81 mg Oral Daily   Or  . aspirin  81 mg Per Tube Daily  . digoxin  0.125 mg Oral Daily  . finasteride  5 mg Oral QHS  . pantoprazole  40 mg Oral Daily  . Warfarin - Pharmacist Dosing Inpatient   Does not apply q1800   Continuous Infusions: .  ceFAZolin (ANCEF) IV Stopped (09/05/17 2355)   PRN Meds: acetaminophen, guaiFENesin, ondansetron (ZOFRAN) IV, traMADol   Vital Signs    Vitals:   09/06/17 0028 09/06/17 0300 09/06/17 0314 09/06/17 0400  BP:    (!) 113/49  Pulse:  78  67  Resp:  (!) 22  (!) 22  Temp: 98.7 F (37.1 C)  98.7 F (37.1 C)   TempSrc: Oral  Oral   SpO2:  93%  95%  Weight:        Intake/Output Summary (Last 24 hours) at 09/06/2017 6834 Last data filed at 09/05/2017 2311 Gross per 24 hour  Intake 780 ml  Output 1925 ml  Net -1145 ml   Filed Weights   09/04/17 0837 09/05/17 0500  Weight: 170 lb (77.1 kg) 169 lb 8.5 oz (76.9 kg)    Telemetry    Atrial fibrillation, HR 80's - Personally Reviewed   Physical Exam  Elderly male in NAD, just returned from walking a lap around the ICU GEN: No acute distress.   Neck: No JVD Cardiac: irregular, 3/6 systolic murmur at apex  Respiratory: Clear to auscultation bilaterally. GI: Soft, nontender, non-distended  MS: No edema; No deformity. BL groin sites clear Neuro:  Nonfocal  Psych: Normal affect   Labs    Chemistry Recent Labs  Lab 08/31/17 1426  09/04/17 1139 09/04/17 1227 09/05/17 0337 09/06/17 0259  NA 133*   < > 137 137 132* 134*  K 4.1   < > 3.7 3.9 4.2 4.0  CL 100*   < > 97*  --  100* 98*  CO2 24  --   --   --  25 26  GLUCOSE 93    < > 99 100* 111* 86  BUN 15   < > 13  --  10 16  CREATININE 0.86   < > 0.70  --  0.70 0.81  CALCIUM 8.7*  --   --   --  8.3* 8.1*  PROT 6.8  --   --   --   --   --   ALBUMIN 3.5  --   --   --   --   --   AST 20  --   --   --   --   --   ALT 11*  --   --   --   --   --   ALKPHOS 52  --   --   --   --   --   BILITOT 1.0  --   --   --   --   --   GFRNONAA >60  --   --   --  >60 >60  GFRAA >60  --   --   --  >60 >60  ANIONGAP 9  --   --   --  7 10   < > = values in this interval not displayed.     Hematology Recent Labs  Lab 09/04/17 1226 09/04/17 1227 09/05/17 0337 09/06/17 0259  WBC 9.8  --  8.2 8.6  RBC 3.33*  --  3.38* 3.44*  HGB 9.7* 9.5* 9.6* 9.8*  HCT 29.7* 28.0* 30.0* 30.9*  MCV 89.2  --  88.8 89.8  MCH 29.1  --  28.4 28.5  MCHC 32.7  --  32.0 31.7  RDW 16.8*  --  16.4* 16.6*  PLT 162  --  180 180    Cardiac EnzymesNo results for input(s): TROPONINI in the last 168 hours. No results for input(s): TROPIPOC in the last 168 hours.   BNP Recent Labs  Lab 08/31/17 1427  BNP 343.3*     DDimer No results for input(s): DDIMER in the last 168 hours.   Radiology    Dg Chest Port 1 View  Result Date: 09/04/2017 CLINICAL DATA:  Status post trans catheter aortic valve replacement. EXAM: PORTABLE CHEST 1 VIEW COMPARISON:  Portable chest x-ray of August 31, 2017 FINDINGS: The lungs are well-expanded. A prosthetic aortic valve cage is visible. The cardiac silhouette is mildly enlarged. The pulmonary vascularity is engorged and indistinct and the pulmonary interstitial markings are increased. There is no pleural effusion or pneumothorax. There is dense calcification in the wall of the thoracic aorta. The right internal jugular venous catheter tip projects over the junction of the proximal and midportions of the SVC. IMPRESSION: Moderate pulmonary interstitial edema following transcatheter aortic valve replacement. Thoracic aortic atherosclerosis. Electronically Signed   By: David   Martinique M.D.   On: 09/04/2017 12:52    Cardiac Studies   POD #1 Echo: Left ventricle:  The cavity size was normal. Wall thickness was increased in a pattern of moderate LVH. Systolic function was normal. The estimated ejection fraction was in the range of 60% to 65%. Wall motion was normal; there were no regional wall motion abnormalities.  ------------------------------------------------------------------- Aortic valve:  The TAVR appears to be functioning normally . A bioprosthesis was present.  Doppler:     VTI ratio of LVOT to aortic valve: 0.45. Peak velocity ratio of LVOT to aortic valve: 0.37. Mean velocity ratio of LVOT to aortic valve: 0.39.    Mean gradient (S): 7 mm Hg. Peak gradient (S): 16 mm Hg.  ------------------------------------------------------------------- Aorta:  Aortic root: The aortic root was normal in size. Ascending aorta: The ascending aorta was normal in size.  ------------------------------------------------------------------- Mitral valve:  The valve is heavily calcified. There is a mobile , calcified density that appears to be a pruptured chordae or perhaps a small partially flail leaflet. The mitral regurgitation was severe in the pre-TAVR echo but appears to be somewhat better today. PISA was not measured in this echo. He still has at least moderate MR.  Moderately calcified annulus. Moderately thickened, moderately calcified leaflets .  Doppler: The findings are consistent with moderate to severe stenosis. There was mild to moderate regurgitation.    Valve area by pressure half-time: 2.18 cm^2. Indexed valve area by pressure half-time: 1.11 cm^2/m^2.    Mean gradient (D): 10 mm Hg.  ------------------------------------------------------------------- Left atrium:  The atrium was severely dilated.  ------------------------------------------------------------------- Right ventricle:  The cavity size was normal. Systolic function  was normal.  ------------------------------------------------------------------- Pulmonic valve:    The valve appears to be grossly normal. Doppler:  There was no significant regurgitation.  -------------------------------------------------------------------  Tricuspid valve:   The valve appears to be grossly normal. Doppler:  There was mild-moderate regurgitation.  ------------------------------------------------------------------- Pulmonary artery:   Systolic pressure was moderately increased.  ------------------------------------------------------------------- Right atrium:  The atrium was severely dilated.  ------------------------------------------------------------------- Pericardium:  There was no pericardial effusion.  ------------------------------------------------------------------- Measurements   LVOT                                     Value          Reference  LVOT peak velocity, S                    73.4  cm/s     ----------  LVOT mean velocity, S                    44.7  cm/s     ----------  LVOT VTI, S                              15.9  cm       ----------  LVOT peak gradient, S                    2     mm Hg    ----------    Aortic valve                             Value          Reference  Aortic valve peak velocity, S            198   cm/s     ----------  Aortic valve mean velocity, S            116   cm/s     ----------  Aortic valve VTI, S                      35.3  cm       ----------  Aortic mean gradient, S                  7     mm Hg    ----------  Aortic peak gradient, S                  16    mm Hg    ----------  VTI ratio, LVOT/AV                       0.45           ----------  Velocity ratio, peak, LVOT/AV            0.37           ----------  Velocity ratio, mean, LVOT/AV            0.39           ----------    Left atrium                              Value          Reference  LA volume, S  165   ml        ----------  LA volume/bsa, S                         84.2  ml/m^2   ----------  LA volume, ES, 1-p A4C                   156   ml       ----------  LA volume/bsa, ES, 1-p A4C               79.6  ml/m^2   ----------  LA volume, ES, 1-p A2C                   165   ml       ----------  LA volume/bsa, ES, 1-p A2C               84.2  ml/m^2   ----------    Mitral valve                             Value          Reference  Mitral mean velocity, D                  140   cm/s     ----------  Mitral pressure half-time                101   ms       ----------  Mitral mean gradient, D                  10    mm Hg    ----------  Mitral valve area, PHT, DP               2.18  cm^2     ----------  Mitral valve area/bsa, PHT, DP           1.11  cm^2/m^2 ----------  Mitral annulus VTI, D                    59.2  cm       ----------  Mitral maximal regurg velocity,          485   cm/s     ----------  PISA  Mitral regurg VTI, PISA                  152   cm       ----------    Pulmonary arteries                       Value          Reference  PA pressure, S, DP                (H)    49    mm Hg    <=30    Tricuspid valve                          Value          Reference  Tricuspid regurg peak velocity           293   cm/s     ----------  Tricuspid peak RV-RA gradient            34  mm Hg    ----------    Right atrium                             Value          Reference  RA ID, S-I, ES, A4C               (H)    67.6  mm       34 - 49  RA area, ES, A4C                  (H)    35    cm^2     8.3 - 19.5  RA volume, ES, A/L                       137   ml       ----------  RA volume/bsa, ES, A/L                   69.9  ml/m^2   ----------    Systemic veins                           Value          Reference  Estimated CVP                            15    mm Hg    ----------    Right ventricle                          Value          Reference  RV ID, minor axis, ED, A4C base          48.1  mm        ----------  RV ID, minor axis, ED, A4C mid           34.3  mm       ----------  RV ID, major axis, ED, A4C        (L)    53.3  mm       55 - 91  TAPSE                                    17.5  mm       ----------  RV pressure, S, DP                (H)    49    mm Hg    <=30  RV s&', lateral, S                        8.18  cm/s     ----------   Patient Profile     84 y.o. male with permanent atrial fibrillation, severe mitral regurgitation, and severe aortic stenosis who presents for elective TAVR September 04, 2017  Assessment & Plan    1. Severe aortic stenosis: POD #2 from TAVR, 26 mm S3 valve. Post-op echo reviewed and shows normal valve function, normal LV systolic function, and no significant change in mitral valve disease. Clinically improved. Continue warfarin indefinitely, ASA 81 mg x 3 months.   2.  Acute on chronic diastolic heart failure: clinically improved with diuresis and TAVR. Plan resume home Rx. Lasix 40 mg PO daily.  3. Permanent atrial fibrillation: continue warfarin for anticoagulation. Reduce metoprolol to 12.5 mg BID as BP is a little low. HR controlled.   4. Moderate/Severe MR: stable by echo, continue medical Rx.   5. Deconditioning/frailty: did well this morning walking with RN. Not much change from baseline. PT/OT assessment today.   Dispo: anticipate home today. Good family support. FU APP next week.   For questions or updates, please contact [place] Please consult [email or web address] for contact info under Cardiology/STEMI.      Signed, Sherren Mocha, MD  09/06/2017, 6:39 AM

## 2017-09-06 NOTE — Care Management Note (Signed)
Case Management Note Wayne Gibbons RN, BSN Unit 4E-Case Manager- [place] coverage [phone]   Patient Details  Name: Wayne [name] MRN: [id number] Date of Birth: 12-Oct-1929  Subjective/Objective:  Pt admitted s/p TAVR                  Action/Plan: PTA pt lived at home with spouse- PT eval pending, CM to follow for recommendations and transition of care needs.   Expected Discharge Date:  09/06/17               Expected Discharge Plan:  [place]  In-House Referral:  NA  Discharge planning Services  CM Consult  Post Acute Care Choice:  Durable Medical Equipment, Home Health Choice offered to:  Patient  DME Arranged:  Walker rolling with seat DME Agency:  [place] Arranged:  PT [facility] Agency:  [place]  Status of Service:  Completed, signed off  If discussed at [place] of Stay Meetings, dates discussed:    Discharge Disposition:  Home/home health   Additional Comments:  09/06/17- 1145- Wayne Baltes RN, CM- pt for d/c home today with wife- per PT eval recommendation for [facility] and DME-rollator- orders have been placed- pt will need face59face for [facility] order -have paged Brock Ra regarding this- spoke with pt and wife at bedside- pt has cane- agrees with need for rollator- discussed PT Recommendation and pt agreeable to HHPT- choice offered for [facility] agency- per pt and wife they have used [facility] in past and would like to use them again- referral called to [facility] for both DME and HHPT needs- rollator to be delivered to room prior to discharge.   Wayne Patricia, RN 09/06/2017, 11:56 AM

## 2017-09-06 NOTE — Progress Notes (Signed)
Wayne [name] for coumadin Indication: atrial fibrillation  Allergies  Allergen Reactions  . Penicillins Rash and Other (See Comments)    PATIENT HAS HAD A PCN REACTION WITH IMMEDIATE RASH, FACIAL/TONGUE/THROAT SWELLING, SOB, OR LIGHTHEADEDNESS WITH HYPOTENSION:  #  #  #  YES  #  #  #   Has patient had a PCN reaction causing severe rash involving mucus membranes or skin necrosis: No Has patient had a PCN reaction that required hospitalization No Has patient had a PCN reaction occurring within the last 10 years: No If all of the above answers are "NO", then may proceed with Cephalosporin use.  . Prednisone Other (See Comments)    Wheezing  . Procaine Hcl Other (See Comments)    Passed out after being given this at dental appointment  . Levofloxacin Rash  . Oxycodone Other (See Comments)    constipation    Patient Measurements: Weight: 168 lb 6.9 oz (76.4 kg)  Vital Signs: Temp: 98.1 F (36.7 C) (03/21 0734) Temp Source: Oral (03/21 0734) BP: 113/49 (03/21 0400) Pulse Rate: 77 (03/21 0754)  Labs: Recent Labs    09/04/17 1139 09/04/17 1220 09/04/17 1226 09/04/17 1227 09/05/17 0337 09/06/17 0259  HGB 9.2*  --  9.7* 9.5* 9.6* 9.8*  HCT 27.0*  --  29.7* 28.0* 30.0* 30.9*  PLT  --   --  162  --  180 180  LABPROT  --  17.5*  --   --  15.3* 15.3*  INR  --  1.45  --   --  1.22 1.22  CREATININE 0.70  --   --   --  0.70 0.81    Estimated Creatinine Clearance: 68.7 mL/min (by C-G formula based on SCr of 0.81 mg/dL).   Medical History: Past Medical History:  Diagnosis Date  . Aortic stenosis   . Arthritis   . Atrial fibrillation (HCC)    Chronic  . Atrial fibrillation, chronic ([place])   . BPH (benign prostatic hyperplasia)   . CAD (coronary artery disease)   . Chronic anticoagulation   . History of chicken pox   . HTN (hypertension)   . Mitral stenosis   . Old MI (myocardial infarction) 2007   s/p PCI to distal OM (no stent)  .  Severe mitral regurgitation   . 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 acetaminophen (TYLENOL) 500 MG tablet Take 1,000 mg by mouth every 6 (six) hours as needed for moderate pain or headache.   09/04/2017 at Unknown time  . alfuzosin (UROXATRAL) 10 MG 24 hr tablet Take 10 mg by mouth daily with breakfast.   09/03/2017 at Unknown time  . ARTIFICIAL TEAR OP Place 1 drop into both eyes daily as needed (dry eyes).   09/03/2017 at Unknown time  . cholecalciferol 2000 units TABS Take 1 tablet (2,000 Units total) by mouth daily. 30 tablet 0 09/03/2017 at Unknown time  . Dextromethorphan-Guaifenesin (MUCINEX DM) 30-600 MG TB12 Take 1-2 tablets by mouth every 12 (twelve) hours as needed (for cough/congestion.).   Past Week at Unknown time  . digoxin (LANOXIN) 0.125 MG tablet TAKE ONE TABLET BY MOUTH ONCE DAILY (Patient taking differently: TAKE ONE TABLET (170mcg) BY MOUTH ONCE DAILY) 90 tablet 3 09/03/2017 at Unknown time  . finasteride (PROSCAR) 5 MG tablet Take 5 mg by mouth at bedtime.  09/03/2017 at Unknown time  . furosemide (LASIX) 20 MG tablet Take 80 mg by mouth daily.   09/03/2017 at Unknown time  . Melatonin 1 MG TABS Take 2 mg by mouth at bedtime.    09/03/2017 at Unknown time  . Menthol, Topical Analgesic, (BLUE-EMU MAXIMUM STRENGTH EX) Apply 1 application topically 3 (three) times daily as needed (muscle pain in back). alterrnates between [employer] and Blu Emu   09/03/2017 at Unknown time  . methocarbamol (ROBAXIN) 500 MG tablet Take 500 mg by mouth daily as needed (cramps).    Past Week at Unknown time  . metoprolol tartrate (LOPRESSOR) 25 MG tablet TAKE ONE TABLET (25mg ) BY MOUTH TWICE DAILY. (Patient taking differently: Take 25 mg by mouth 2 (two) times daily. ) 135 tablet 3 09/03/2017 at Unknown time  . polyethylene glycol (MIRALAX / GLYCOLAX) packet Take 17 g by mouth daily.     09/03/2017 at Unknown time  . potassium chloride (K-DUR) 10 MEQ tablet Take 1 tablet (10 mEq total) by mouth daily as needed. Take potasium if you take lasix. (Patient taking differently: Take 10 mEq by mouth See admin instructions. Take 1 tablet (10 meq) twice daily for 3 days (beginning 08/24/2017) then return to 1 tablet (10 meq) daily in the morning) 30 tablet 2 09/03/2017 at Unknown time  . sodium chloride (OCEAN) 0.65 % SOLN nasal spray Place 1 spray into both nostrils daily as needed for congestion.   Past Week at Unknown time  . triamcinolone cream (KENALOG) 0.1 % Apply 1 application topically daily as needed (dry skin).    09/03/2017 at Unknown time  . warfarin (COUMADIN) 2.5 MG tablet TAKE 1 TO 2 TABLETS BY MOUTH ONCE DAILY AS DIRECTED BY  COUMADIN  CLINIC (Patient taking differently: Take 2.5-5 mg by mouth See admin instructions. TAKE 1 TABLET (2.5 MG) BY MOUTH ON TUESDAYS AND SATURDAYS TAKE 2 TABLETS (5 MG) BY MOUTH ON SUNDAY, MONDAY, WEDNESDAYS, THURSDAYS, AND FRIDAYS.) 180 tablet 0 Taking  . calcium carbonate (TUMS - DOSED IN MG ELEMENTAL CALCIUM) 500 MG chewable tablet Chew 1-2 tablets by mouth daily as needed for indigestion or heartburn.   Unknown at Unknown time  . Menthol, Topical Analgesic, (BIOFREEZE EX) Apply 1 application topically 3 (three) times daily as needed (muscle pain in back). alterrnates between [employer] and [employer]mu   Unknown at Unknown time   Scheduled:  . alfuzosin  10 mg Oral Q breakfast  . aspirin EC  81 mg Oral Daily   Or  . aspirin  81 mg Per Tube Daily  . digoxin  0.125 mg Oral Daily  . finasteride  5 mg Oral QHS  . furosemide  40 mg Oral Daily  . metoprolol tartrate  12.5 mg Oral BID  . pantoprazole  40 mg Oral Daily  . potassium chloride  10 mEq Oral Daily  . Warfarin - Pharmacist Dosing Inpatient   Does not apply q1800    Assessment: 84 yo male s/p TAVR on coumadin PTA for afib. Pharmacy consulted to restart. Plans noted for discharge today -INR=  1.22  Home dose: 5mg /day except take 2.5mg  TuSa (last clinic visit on 3/11; INR was 2.8)  Goal of Therapy:  INR 2-3 Monitor platelets by anticoagulation protocol: Yes   Plan:  -Would discharge on home coumadin regimen -Daily PT/INR  Hildred Laser, PharmD Clinical Pharmacist Clinical phone from 8:30-4:00 is [phone] After 4pm, please call Main Rx [phone]) for assistance. 09/06/2017 8:47 AM

## 2017-09-06 NOTE — Evaluation (Signed)
Physical Therapy Evaluation Patient Details Name: Wayne [name] MRN: [id number] DOB: [birth year] Today's Date: 09/06/2017   History of Present Illness  Pt s/p TAVR. PMH - CVA, afib, aortic stenosis, MI, HTN, arthritis  Clinical Impression  Pt doing well with mobility post TAVR. Recommend rollator for home and HHPT. Feel pt has excellent potential with HHPT to make significant improvements in activity tolerance. Pt's current walker at home is >82 years old per pt. With rollator pt will be able to keep some form of walker on two floors at home.    Follow Up Recommendations Home health PT    Equipment Recommendations  Other (comment)(rollator)    Recommendations for Other Services       Precautions / Restrictions Precautions Precautions: Fall Restrictions Weight Bearing Restrictions: No      Mobility  Bed Mobility Overal bed mobility: Modified Independent             General bed mobility comments: sit to supine modified independent  Transfers Overall transfer level: Needs assistance Equipment used: Rolling walker (2 wheeled);4-wheeled walker Transfers: Sit to/from Stand Sit to Stand: Supervision            Ambulation/Gait Ambulation/Gait assistance: Supervision Ambulation Distance (Feet): 300 Feet Assistive device: Rolling walker (2 wheeled);4-wheeled walker Gait Pattern/deviations: Step-through pattern;Decreased stride length;Drifts right/left;Trunk flexed Gait velocity: decr Gait velocity interpretation: Below normal speed for age/gender General Gait Details: Pt with drifting during gait but no loss of balance. Pt moved more fluidly using rollator. Pt with no dyspnea.  Stairs Stairs: Yes Stairs assistance: Supervision Stair Management: One rail Left;Step to pattern;Sideways(Used both hands on rail) Number of Stairs: 5 General stair comments: Pt with good technique with stairs  Wheelchair Mobility    Modified Rankin (Stroke Patients Only)       Balance Overall balance assessment: Needs assistance Sitting-balance support: No upper extremity supported;Feet supported Sitting balance-Leahy Scale: Good     Standing balance support: No upper extremity supported Standing balance-Leahy Scale: Fair                               Pertinent Vitals/Pain Pain Assessment: No/denies pain    Home Living Family/patient expects to be discharged to:: Private residence Living Arrangements: Spouse/significant other Available Help at Discharge: Family;Available 24 hours/day(wife can provide supervision but not physical assist) Type of Home: House Home Access: Stairs to enter Entrance Stairs-Rails: None Entrance Stairs-Number of Steps: 2-3 Home Layout: Multi-level;Bed/bath upstairs Home Equipment: Environmental consultant - 2 wheels Additional Comments: Pt's walker is very old    Prior Function Level of Independence: Independent with assistive device(s)         Comments: Uses rolling walker in upstairs portion of house     Hand Dominance        Extremity/Trunk Assessment   Upper Extremity Assessment Upper Extremity Assessment: Generalized weakness    Lower Extremity Assessment Lower Extremity Assessment: Generalized weakness    Cervical / Trunk Assessment Cervical / Trunk Assessment: Kyphotic  Communication   Communication: HOH  Cognition Arousal/Alertness: Awake/alert Behavior During Therapy: WFL for tasks assessed/performed Overall Cognitive Status: Within Functional Limits for tasks assessed                                        General Comments      Exercises     Assessment/Plan  PT Assessment All further PT needs can be met in the next venue of care  PT Problem List Decreased strength;Decreased activity tolerance;Decreased balance;Decreased mobility       PT Treatment Interventions      PT Goals (Current goals can be found in the Care Plan section)  Acute Rehab PT Goals PT Goal  Formulation: All assessment and education complete, DC therapy    Frequency     Barriers to discharge        Co-evaluation               AM-PAC PT "6 Clicks" Daily Activity  Outcome Measure Difficulty turning over in bed (including adjusting bedclothes, sheets and blankets)?: None Difficulty moving from lying on back to sitting on the side of the bed? : A Little Difficulty sitting down on and standing up from a chair with arms (e.g., wheelchair, bedside commode, etc,.)?: A Little Help needed moving to and from a bed to chair (including a wheelchair)?: None Help needed walking in hospital room?: None Help needed climbing 3-5 steps with a railing? : A Little 6 Click Score: 21    End of Session Equipment Utilized During Treatment: Gait belt Activity Tolerance: Patient tolerated treatment well Patient left: in bed;with call bell/phone within reach Nurse Communication: Mobility status(Nurse present for portion of session) PT Visit Diagnosis: Unsteadiness on feet (R26.81);Other abnormalities of gait and mobility (R26.89)    Time: 9798-9211 PT Time Calculation (min) (ACUTE ONLY): 21 min   Charges:   PT Evaluation $PT Eval Moderate Complexity: 1 Mod     PT G CodesMarland Kitchen        [facility] PT [place] 09/06/2017, 10:19 AM

## 2017-09-06 NOTE — Progress Notes (Signed)
Pt has walked x2 today. PT suggests rollator. Discussed with pt and family walking daily, restrictions, and CRPII. Will refer to [facility]. Good reception, he is ready to get more conditioned again. [place], ACSM 11:30 AM 09/06/2017

## 2017-09-06 NOTE — Plan of Care (Signed)
All goal met or adequate for discharge.

## 2017-09-07 ENCOUNTER — Telehealth: Payer: Self-pay

## 2017-09-07 NOTE — Telephone Encounter (Signed)
Patient contacted regarding discharge from [facility] on 09/06/17.  Patient understands to follow up with provider Almyra Deforest PA on 09/17/2017 at 10:00 AM at [facility] office. Patient understands discharge instructions? yes Patient understands medications and regiment? yes Patient understands to bring all medications to this visit? yes  I also advised the pt of pending coumadin apt on 09/10/17.

## 2017-09-08 DIAGNOSIS — I2510 Atherosclerotic heart disease of native coronary artery without angina pectoris: Secondary | ICD-10-CM | POA: Diagnosis not present

## 2017-09-08 DIAGNOSIS — Z8673 Personal history of transient ischemic attack (TIA), and cerebral infarction without residual deficits: Secondary | ICD-10-CM | POA: Diagnosis not present

## 2017-09-08 DIAGNOSIS — N401 Enlarged prostate with lower urinary tract symptoms: Secondary | ICD-10-CM | POA: Diagnosis not present

## 2017-09-08 DIAGNOSIS — Z48812 Encounter for surgical aftercare following surgery on the circulatory system: Secondary | ICD-10-CM | POA: Diagnosis not present

## 2017-09-08 DIAGNOSIS — I110 Hypertensive heart disease with heart failure: Secondary | ICD-10-CM | POA: Diagnosis not present

## 2017-09-08 DIAGNOSIS — Z952 Presence of prosthetic heart valve: Secondary | ICD-10-CM | POA: Diagnosis not present

## 2017-09-08 DIAGNOSIS — I5033 Acute on chronic diastolic (congestive) heart failure: Secondary | ICD-10-CM | POA: Diagnosis not present

## 2017-09-08 DIAGNOSIS — N320 Bladder-neck obstruction: Secondary | ICD-10-CM | POA: Diagnosis not present

## 2017-09-08 DIAGNOSIS — I482 Chronic atrial fibrillation: Secondary | ICD-10-CM | POA: Diagnosis not present

## 2017-09-08 DIAGNOSIS — I340 Nonrheumatic mitral (valve) insufficiency: Secondary | ICD-10-CM | POA: Diagnosis not present

## 2017-09-10 ENCOUNTER — Encounter (HOSPITAL_COMMUNITY): Payer: Self-pay | Admitting: Emergency Medicine

## 2017-09-10 ENCOUNTER — Inpatient Hospital Stay (HOSPITAL_COMMUNITY)
Admission: EM | Admit: 2017-09-10 | Discharge: 2017-09-15 | DRG: 392 | Disposition: A | Discharge status: Home Health | Payer: Medicare HMO | Attending: Internal Medicine | Admitting: Internal Medicine

## 2017-09-10 ENCOUNTER — Telehealth (HOSPITAL_COMMUNITY): Payer: Self-pay

## 2017-09-10 ENCOUNTER — Emergency Department (HOSPITAL_COMMUNITY): Payer: Medicare HMO

## 2017-09-10 DIAGNOSIS — Z7982 Long term (current) use of aspirin: Secondary | ICD-10-CM

## 2017-09-10 DIAGNOSIS — I482 Chronic atrial fibrillation, unspecified: Secondary | ICD-10-CM | POA: Diagnosis present

## 2017-09-10 DIAGNOSIS — I959 Hypotension, unspecified: Secondary | ICD-10-CM | POA: Diagnosis present

## 2017-09-10 DIAGNOSIS — R109 Unspecified abdominal pain: Secondary | ICD-10-CM

## 2017-09-10 DIAGNOSIS — A080 Rotaviral enteritis: Secondary | ICD-10-CM | POA: Diagnosis not present

## 2017-09-10 DIAGNOSIS — E860 Dehydration: Secondary | ICD-10-CM | POA: Diagnosis present

## 2017-09-10 DIAGNOSIS — I252 Old myocardial infarction: Secondary | ICD-10-CM

## 2017-09-10 DIAGNOSIS — N390 Urinary tract infection, site not specified: Secondary | ICD-10-CM | POA: Diagnosis present

## 2017-09-10 DIAGNOSIS — K529 Noninfective gastroenteritis and colitis, unspecified: Secondary | ICD-10-CM

## 2017-09-10 DIAGNOSIS — I110 Hypertensive heart disease with heart failure: Secondary | ICD-10-CM | POA: Diagnosis present

## 2017-09-10 DIAGNOSIS — Z9861 Coronary angioplasty status: Secondary | ICD-10-CM

## 2017-09-10 DIAGNOSIS — I5032 Chronic diastolic (congestive) heart failure: Secondary | ICD-10-CM | POA: Diagnosis present

## 2017-09-10 DIAGNOSIS — I2510 Atherosclerotic heart disease of native coronary artery without angina pectoris: Secondary | ICD-10-CM | POA: Diagnosis present

## 2017-09-10 DIAGNOSIS — Z8249 Family history of ischemic heart disease and other diseases of the circulatory system: Secondary | ICD-10-CM

## 2017-09-10 DIAGNOSIS — R0789 Other chest pain: Secondary | ICD-10-CM | POA: Diagnosis not present

## 2017-09-10 DIAGNOSIS — I10 Essential (primary) hypertension: Secondary | ICD-10-CM | POA: Diagnosis present

## 2017-09-10 DIAGNOSIS — R112 Nausea with vomiting, unspecified: Secondary | ICD-10-CM | POA: Diagnosis not present

## 2017-09-10 DIAGNOSIS — Z823 Family history of stroke: Secondary | ICD-10-CM

## 2017-09-10 DIAGNOSIS — N400 Enlarged prostate without lower urinary tract symptoms: Secondary | ICD-10-CM | POA: Diagnosis present

## 2017-09-10 DIAGNOSIS — R791 Abnormal coagulation profile: Secondary | ICD-10-CM | POA: Diagnosis present

## 2017-09-10 DIAGNOSIS — J90 Pleural effusion, not elsewhere classified: Secondary | ICD-10-CM | POA: Diagnosis not present

## 2017-09-10 DIAGNOSIS — R197 Diarrhea, unspecified: Secondary | ICD-10-CM | POA: Diagnosis not present

## 2017-09-10 DIAGNOSIS — Z7901 Long term (current) use of anticoagulants: Secondary | ICD-10-CM

## 2017-09-10 DIAGNOSIS — Z8673 Personal history of transient ischemic attack (TIA), and cerebral infarction without residual deficits: Secondary | ICD-10-CM

## 2017-09-10 DIAGNOSIS — I619 Nontraumatic intracerebral hemorrhage, unspecified: Secondary | ICD-10-CM | POA: Diagnosis present

## 2017-09-10 DIAGNOSIS — Z952 Presence of prosthetic heart valve: Secondary | ICD-10-CM

## 2017-09-10 LAB — COMPREHENSIVE METABOLIC PANEL
ALT: 13 U/L — AB (ref 17–63)
ANION GAP: 11 (ref 5–15)
AST: 28 U/L (ref 15–41)
Albumin: 3.5 g/dL (ref 3.5–5.0)
Alkaline Phosphatase: 50 U/L (ref 38–126)
BUN: 18 mg/dL (ref 6–20)
CHLORIDE: 103 mmol/L (ref 101–111)
CO2: 24 mmol/L (ref 22–32)
CREATININE: 0.79 mg/dL (ref 0.61–1.24)
Calcium: 8.6 mg/dL — ABNORMAL LOW (ref 8.9–10.3)
Glucose, Bld: 125 mg/dL — ABNORMAL HIGH (ref 65–99)
POTASSIUM: 4 mmol/L (ref 3.5–5.1)
Sodium: 138 mmol/L (ref 135–145)
Total Bilirubin: 0.9 mg/dL (ref 0.3–1.2)
Total Protein: 6.9 g/dL (ref 6.5–8.1)

## 2017-09-10 LAB — URINALYSIS, ROUTINE W REFLEX MICROSCOPIC
BILIRUBIN URINE: NEGATIVE
Bacteria, UA: NONE SEEN
GLUCOSE, UA: NEGATIVE mg/dL
KETONES UR: 5 mg/dL — AB
LEUKOCYTES UA: NEGATIVE
Nitrite: NEGATIVE
PROTEIN: 30 mg/dL — AB
SQUAMOUS EPITHELIAL / LPF: NONE SEEN
Specific Gravity, Urine: 1.023 (ref 1.005–1.030)
pH: 5 (ref 5.0–8.0)

## 2017-09-10 LAB — CBC WITH DIFFERENTIAL/PLATELET
BASOS ABS: 0 10*3/uL (ref 0.0–0.1)
Basophils Relative: 0 %
EOS PCT: 0 %
Eosinophils Absolute: 0 10*3/uL (ref 0.0–0.7)
HEMATOCRIT: 37 % — AB (ref 39.0–52.0)
HEMOGLOBIN: 11.8 g/dL — AB (ref 13.0–17.0)
Lymphocytes Relative: 4 %
Lymphs Abs: 0.3 10*3/uL — ABNORMAL LOW (ref 0.7–4.0)
MCH: 28.4 pg (ref 26.0–34.0)
MCHC: 31.9 g/dL (ref 30.0–36.0)
MCV: 88.9 fL (ref 78.0–100.0)
Monocytes Absolute: 0.3 10*3/uL (ref 0.1–1.0)
Monocytes Relative: 3 %
Neutro Abs: 8 10*3/uL — ABNORMAL HIGH (ref 1.7–7.7)
Neutrophils Relative %: 93 %
Platelets: 198 10*3/uL (ref 150–400)
RBC: 4.16 MIL/uL — AB (ref 4.22–5.81)
RDW: 16.7 % — ABNORMAL HIGH (ref 11.5–15.5)
WBC: 8.6 10*3/uL (ref 4.0–10.5)

## 2017-09-10 LAB — I-STAT TROPONIN, ED: Troponin i, poc: 0.02 ng/mL (ref 0.00–0.08)

## 2017-09-10 LAB — I-STAT CG4 LACTIC ACID, ED
LACTIC ACID, VENOUS: 1.57 mmol/L (ref 0.5–1.9)
Lactic Acid, Venous: 1.38 mmol/L (ref 0.5–1.9)

## 2017-09-10 LAB — PROTIME-INR
INR: 1.66
Prothrombin Time: 19.5 seconds — ABNORMAL HIGH (ref 11.4–15.2)

## 2017-09-10 MED ORDER — POLYETHYLENE GLYCOL 3350 17 G PO PACK: 17.0000 g | PACK | Freq: Every day | ORAL | Status: DC | PRN

## 2017-09-10 MED ORDER — ASPIRIN 81 MG PO CHEW
81.0000 mg | CHEWABLE_TABLET | Freq: Every day | ORAL | Status: DC
  Administered 2017-09-10 – 2017-09-15 (×6): 81 mg via ORAL
  Filled 2017-09-10 (×6): qty 1

## 2017-09-10 MED ORDER — FINASTERIDE 5 MG PO TABS
5.0000 mg | ORAL_TABLET | Freq: Every day | ORAL | Status: DC
  Administered 2017-09-10 – 2017-09-14 (×5): 5 mg via ORAL
  Filled 2017-09-10 (×6): qty 1

## 2017-09-10 MED ORDER — ALFUZOSIN HCL ER 10 MG PO TB24
10.0000 mg | ORAL_TABLET | Freq: Every day | ORAL | Status: DC
  Administered 2017-09-10 – 2017-09-15 (×5): 10 mg via ORAL
  Filled 2017-09-10 (×5): qty 1

## 2017-09-10 MED ORDER — ACETAMINOPHEN 650 MG RE SUPP: 650.0000 mg | Freq: Four times a day (QID) | RECTAL | Status: DC | PRN

## 2017-09-10 MED ORDER — SODIUM CHLORIDE 0.9 % IV SOLN
INTRAVENOUS | Status: DC
  Administered 2017-09-10 – 2017-09-15 (×7): via INTRAVENOUS

## 2017-09-10 MED ORDER — WARFARIN - PHARMACIST DOSING INPATIENT
Freq: Every day | Status: DC
  Administered 2017-09-12: 19:00:00

## 2017-09-10 MED ORDER — POTASSIUM CHLORIDE CRYS ER 10 MEQ PO TBCR
10.0000 meq | EXTENDED_RELEASE_TABLET | Freq: Every day | ORAL | Status: DC
  Administered 2017-09-10 – 2017-09-15 (×6): 10 meq via ORAL
  Filled 2017-09-10 (×6): qty 1

## 2017-09-10 MED ORDER — METHOCARBAMOL 500 MG PO TABS: 500.0000 mg | ORAL_TABLET | Freq: Every day | ORAL | Status: DC | PRN

## 2017-09-10 MED ORDER — SODIUM CHLORIDE 0.9 % IV BOLUS
500.0000 mL | Freq: Once | INTRAVENOUS | Status: AC
  Administered 2017-09-10: 500 mL via INTRAVENOUS

## 2017-09-10 MED ORDER — ONDANSETRON 4 MG PO TBDP
4.0000 mg | ORAL_TABLET | Freq: Once | ORAL | Status: AC
  Administered 2017-09-10: 4 mg via ORAL
  Filled 2017-09-10: qty 1

## 2017-09-10 MED ORDER — WARFARIN SODIUM 5 MG PO TABS
5.0000 mg | ORAL_TABLET | ORAL | Status: AC
  Administered 2017-09-10: 5 mg via ORAL
  Filled 2017-09-10 (×2): qty 1

## 2017-09-10 MED ORDER — PROMETHAZINE HCL 25 MG PO TABS: 12.5000 mg | ORAL_TABLET | Freq: Four times a day (QID) | ORAL | Status: DC | PRN

## 2017-09-10 MED ORDER — MELATONIN 1 MG PO TABS
2.0000 mg | ORAL_TABLET | Freq: Every day | ORAL | Status: DC
  Filled 2017-09-10: qty 2

## 2017-09-10 MED ORDER — ACETAMINOPHEN 325 MG PO TABS
650.0000 mg | ORAL_TABLET | Freq: Four times a day (QID) | ORAL | Status: DC | PRN
  Administered 2017-09-11 – 2017-09-15 (×10): 650 mg via ORAL
  Filled 2017-09-10 (×10): qty 2

## 2017-09-10 MED ORDER — DIGOXIN 125 MCG PO TABS
125.0000 ug | ORAL_TABLET | Freq: Every day | ORAL | Status: DC
  Administered 2017-09-10 – 2017-09-15 (×6): 125 ug via ORAL
  Filled 2017-09-10 (×6): qty 1

## 2017-09-10 MED ORDER — SALINE SPRAY 0.65 % NA SOLN
1.0000 | Freq: Every day | NASAL | Status: DC | PRN
  Filled 2017-09-10: qty 44

## 2017-09-10 MED ORDER — FUROSEMIDE 40 MG PO TABS
40.0000 mg | ORAL_TABLET | Freq: Every day | ORAL | Status: DC
  Administered 2017-09-10 – 2017-09-11 (×2): 40 mg via ORAL
  Filled 2017-09-10: qty 1
  Filled 2017-09-10: qty 2

## 2017-09-10 MED ORDER — MELATONIN 1 MG PO TABS
3.0000 mg | ORAL_TABLET | Freq: Every day | ORAL | Status: DC
  Administered 2017-09-11 – 2017-09-14 (×4): 3 mg via ORAL
  Filled 2017-09-10 (×5): qty 3

## 2017-09-10 MED ORDER — METOPROLOL TARTRATE 12.5 MG HALF TABLET
12.5000 mg | ORAL_TABLET | Freq: Two times a day (BID) | ORAL | Status: DC
Start: 2017-09-10 — End: 2017-09-15
  Administered 2017-09-10 – 2017-09-15 (×10): 12.5 mg via ORAL
  Filled 2017-09-10 (×10): qty 1

## 2017-09-10 MED ORDER — WARFARIN SODIUM 2.5 MG PO TABS: 2.5000 mg | ORAL_TABLET | ORAL | Status: DC

## 2017-09-10 NOTE — ED Provider Notes (Signed)
[place] EMERGENCY DEPARTMENT Provider Note   CSN: [id number] Arrival date & time: 09/10/17  1136     History   Chief Complaint Chief Complaint  Patient presents with  . Post-op Problem    HPI Wayne [name] is a 84 y.o. male.  84 year old male 6 days post op from aortic valve repair. Patient has been doing well at home until last night when he developed nausea, vomiting and diarrhea. Patient reports 7-8 episodes of vomiting, with one diarrheal stool earlier today. Patient received zofran today, and emesis is currently controlled. Patient denies abdominal pain, shortness of breath. He endorses an episode of chest tightness earlier today. No current chest discomfort.  The history is provided by the patient and a relative. No language interpreter was used.  Emesis   This is a new problem. The current episode started 12 to 24 hours ago. The problem occurs 5 to 10 times per day. The problem has been gradually improving. The emesis has an appearance of stomach contents. There has been no fever. Associated symptoms include diarrhea. Pertinent negatives include no abdominal pain, no cough and no fever.    Past Medical History:  Diagnosis Date  . Aortic stenosis   . Arthritis   . Atrial fibrillation (HCC)    Chronic  . Atrial fibrillation, chronic ([place])   . BPH (benign prostatic hyperplasia)   . CAD (coronary artery disease)   . Chronic anticoagulation   . History of chicken pox   . HTN (hypertension)   . Mitral stenosis   . Old MI (myocardial infarction) 2007   s/p PCI to distal OM (no stent)  . Severe mitral regurgitation   . 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 S/P TAVR (transcatheter aortic valve replacement) 09/04/2017  . Severe mitral regurgitation   . Insomnia 07/10/2017  . High risk medication use 07/10/2017  . Long term current use of anticoagulant  07/10/2017  . Acute on chronic diastolic heart failure ([place]) 05/20/2017  . Hyponatremia 05/20/2017  . Severe aortic stenosis 05/20/2017  . Urinary retention   . Arthritis of facet joints at multiple vertebral levels ([place]) 04/26/2017  . Spinal stenosis at L4-L5 level 04/26/2017  . Lumbar radiculopathy, chronic 04/26/2017  . Benign colonic polyp- 25 yrs ago.  11/16/2016  . Vitamin D deficiency 11/16/2016  . H/O non anemic vitamin B12 deficiency 11/16/2016  . Mild Colonic dysmotility 11/16/2016  . Hypokalemia 09/13/2016  . BPH with obstruction/lower urinary tract symptoms 09/13/2016  . Urinary frequency 07/10/2016  . Left bundle branch block (LBBB) on electrocardiogram 06/30/2016  . History of stroke 06/30/2016  . Generalized weakness 06/30/2016  . Osteoarthritis 04/04/2016  . Encounter for therapeutic drug monitoring 08/06/2013  . Chronic anticoagulation   . HTN (hypertension)   . CAD (coronary artery disease)   . Chronic atrial fibrillation ([place]) 09/13/2010  . h/o CVA (cerebrovascular accident due to intracerebral hemorrhage) 09/13/2010    Past Surgical History:  Procedure Laterality Date  . CORONARY ANGIOPLASTY  2007   Distal OM  . PROSTATE BIOPSY    . RIGHT/LEFT HEART CATH AND CORONARY ANGIOGRAPHY N/A 08/01/2017   Procedure: RIGHT/LEFT HEART CATH AND CORONARY ANGIOGRAPHY;  Surgeon: Sherren Mocha, MD;  Location: [facility];  Service: Cardiovascular;  Laterality: N/A;  . TEE WITHOUT CARDIOVERSION N/A 07/20/2017   Procedure: TRANSESOPHAGEAL ECHOCARDIOGRAM (TEE);  Surgeon: Lelon Perla,  MD;  Location: [facility];  Service: Cardiovascular;  Laterality: N/A;  . TEE WITHOUT CARDIOVERSION N/A 09/04/2017   Procedure: TRANSESOPHAGEAL ECHOCARDIOGRAM (TEE);  Surgeon: Sherren Mocha, MD;  Location: [facility];  Service: Open Heart Surgery;  Laterality: N/A;  . TRANSCATHETER AORTIC VALVE REPLACEMENT, TRANSFEMORAL N/A 09/04/2017   Procedure: TRANSCATHETER AORTIC VALVE REPLACEMENT,  TRANSFEMORAL;  Surgeon: Sherren Mocha, MD;  Location: [facility];  Service: Open Heart Surgery;  Laterality: N/A;        Home Medications    Prior to Admission medications   Medication Sig Start Date End Date Taking? Authorizing Provider  acetaminophen (TYLENOL) 500 MG tablet Take 1,000 mg by mouth every 6 (six) hours as needed for moderate pain or headache.    [provider]  alfuzosin (UROXATRAL) 10 MG 24 hr tablet Take 10 mg by mouth daily with breakfast.    [provider]  ARTIFICIAL TEAR OP Place 1 drop into both eyes daily as needed (dry eyes).    [provider]  aspirin EC 81 MG EC tablet Take 1 tablet (81 mg total) by mouth daily. 09/07/17 12/06/17  Kathyrn Drown D, NP  calcium carbonate (TUMS - DOSED IN MG ELEMENTAL CALCIUM) 500 MG chewable tablet Chew 1-2 tablets by mouth daily as needed for indigestion or heartburn.    [provider]  cholecalciferol 2000 units TABS Take 1 tablet (2,000 Units total) by mouth daily. 05/24/17   Regalado, Belkys A, MD  Dextromethorphan-Guaifenesin (MUCINEX DM) 30-600 MG TB12 Take 1-2 tablets by mouth every 12 (twelve) hours as needed (for cough/congestion.).    [provider]  digoxin (LANOXIN) 0.125 MG tablet TAKE ONE TABLET BY MOUTH ONCE DAILY Patient taking differently: TAKE ONE TABLET (158mcg) BY MOUTH ONCE DAILY 03/13/17   Lelon Perla, MD  finasteride (PROSCAR) 5 MG tablet Take 5 mg by mouth at bedtime.  02/14/17   Lelon Perla, MD  furosemide (LASIX) 40 MG tablet Take 1 tablet (40 mg total) by mouth daily. 09/07/17   Kathyrn Drown D, NP  Melatonin 1 MG TABS Take 2 mg by mouth at bedtime.     [provider]  Menthol, Topical Analgesic, (BIOFREEZE EX) Apply 1 application topically 3 (three) times daily as needed (muscle pain in back). alterrnates between [employer] and Blu Emu    [provider]  Menthol, Topical Analgesic, (BLUE-EMU MAXIMUM STRENGTH EX) Apply 1 application  topically 3 (three) times daily as needed (muscle pain in back). alterrnates between [employer] and [employer]mu    [provider]  methocarbamol (ROBAXIN) 500 MG tablet Take 500 mg by mouth daily as needed (cramps).     [provider]  metoprolol tartrate (LOPRESSOR) 25 MG tablet Take 0.5 tablets (12.5 mg total) by mouth 2 (two) times daily. 09/06/17   Kathyrn Drown D, NP  polyethylene glycol (MIRALAX / Floria Raveling) packet Take 17 g by mouth daily.     [provider]  potassium chloride (K-DUR,KLOR-CON) 10 MEQ tablet Take 1 tablet (10 mEq total) by mouth daily. 09/07/17   Tommie Raymond, NP  sodium chloride (OCEAN) 0.65 % SOLN nasal spray Place 1 spray into both nostrils daily as needed for congestion.    [provider]  triamcinolone cream (KENALOG) 0.1 % Apply 1 application topically daily as needed (dry skin).     [provider]  warfarin (COUMADIN) 2.5 MG tablet TAKE 1 TO 2 TABLETS BY MOUTH ONCE DAILY AS DIRECTED BY  COUMADIN  CLINIC Patient  taking differently: Take 2.5-5 mg by mouth See admin instructions. TAKE 1 TABLET (2.5 MG) BY MOUTH ON TUESDAYS AND SATURDAYS TAKE 2 TABLETS (5 MG) BY MOUTH ON SUNDAY, MONDAY, WEDNESDAYS, THURSDAYS, AND FRIDAYS. 07/24/17   Stanford Breed Denice Bors, MD    Family History Family History  Problem Relation Age of Onset  . Heart disease Mother   . Heart attack Mother   . Heart disease Father   . Heart attack Father   . Heart attack Brother   . Heart attack Sister   . Stroke Brother     Social History Social History   Tobacco Use  . Smoking status: Never Smoker  . Smokeless tobacco: Never Used  Substance Use Topics  . Alcohol use: No  . Drug use: No     Allergies   Penicillins; Prednisone; Procaine hcl; Levofloxacin; and Oxycodone   Review of Systems Review of Systems  Constitutional: Negative for fever.  Respiratory: Positive for chest tightness. Negative for cough and shortness of breath.     Gastrointestinal: Positive for diarrhea and vomiting. Negative for abdominal pain.  All other systems reviewed and are negative.    Physical Exam Updated Vital Signs BP 118/63   Pulse (!) 111   Temp 98.3 F (36.8 C) (Oral)   Resp 13   Ht 5' 10.5" (1.791 m)   Wt 77.1 kg (170 lb)   SpO2 95%   BMI 24.05 kg/m   Physical Exam  Constitutional: He is oriented to person, place, and time. He appears well-developed.  HENT:  Head: Normocephalic and atraumatic.  Eyes: Conjunctivae are normal.  Neck: Neck supple.  Cardiovascular: Normal heart sounds and intact distal pulses. An irregularly irregular rhythm present.  Pulmonary/Chest: Effort normal and breath sounds normal.  Abdominal: Soft. Bowel sounds are normal. He exhibits no distension. There is no tenderness.  Musculoskeletal: Normal range of motion. He exhibits edema.  Neurological: He is alert and oriented to person, place, and time.  Skin: Skin is warm and dry. He is not diaphoretic.  Mild bruising noted at left groin surgical insertion site. Dressing intact without drainage.  Psychiatric: He has a normal mood and affect.  Nursing note and vitals reviewed.    ED Treatments / Results  Labs (all labs ordered are listed, but only abnormal results are displayed) Labs Reviewed  COMPREHENSIVE METABOLIC PANEL - Abnormal; Notable for the following components:      Result Value   Glucose, Bld 125 (*)    Calcium 8.6 (*)    ALT 13 (*)    All other components within normal limits  CBC WITH DIFFERENTIAL/PLATELET - Abnormal; Notable for the following components:   RBC 4.16 (*)    Hemoglobin 11.8 (*)    HCT 37.0 (*)    RDW 16.7 (*)    Neutro Abs 8.0 (*)    Lymphs Abs 0.3 (*)    All other components within normal limits  PROTIME-INR - Abnormal; Notable for the following components:   Prothrombin Time 19.5 (*)    All other components within normal limits  URINALYSIS, ROUTINE W REFLEX MICROSCOPIC  I-STAT CG4 LACTIC ACID, ED   I-STAT CG4 LACTIC ACID, ED  I-STAT TROPONIN, ED    EKG None  Radiology Dg Chest 2 View  Result Date: 09/10/2017 CLINICAL DATA:  Pt had heart valve replaced on 3/19 and went home from the hospital on Thursday 3/21. Pt was doing well until last night he began to having vomiting x7 and diarrhea x1 that started this  morning. Pt appears to be weak and pale. EXAM: CHEST - 2 VIEW COMPARISON:  09/04/2017 FINDINGS: Cardiac silhouette is mildly enlarged. Prosthetic aortic valve is stable in the prior exam. No mediastinal widening. No mediastinal or hilar masses or evidence of adenopathy. There are small residual pleural effusions. No pneumothorax. Lungs are hyperexpanded. No evidence of pneumonia or pulmonary edema. Pulmonary interstitial edema seen on the prior exam has resolved. Skeletal structures are intact. IMPRESSION: 1. No acute cardiopulmonary disease. 2. Small residual pleural effusions, but no evidence of pulmonary edema. No pneumonia. Electronically Signed   By: Lajean Manes M.D.   On: 09/10/2017 18:05    Procedures Procedures (including critical care time)  Medications Ordered in ED Medications  ondansetron (ZOFRAN-ODT) disintegrating tablet 4 mg (4 mg Oral Given 09/10/17 1200)     Initial Impression / Assessment and Plan / ED Course  I have reviewed the triage vital signs and the nursing notes.  Pertinent labs & imaging results that were available during my care of the patient were reviewed by me and considered in my medical decision making (see chart for details).     Patient with nausea, vomiting, diarrhea. Patient is 6 days post op from aortic valve repair. No indication of infection/sepsis. Improving H/H. Labs are reassuring. Patient with history of atrial fibrillation, recently restarted anti-coagulation after surgical procedure.  Consult with cardiology Marlou Porch) and CVTS ([place]). Recommend admission for gastroenteritis to hospitalist service, and they will follow/evaluate  patient.  Final Clinical Impressions(s) / ED Diagnoses   Final diagnoses:  Gastroenteritis, acute    ED Discharge Orders    None       Etta Quill, NP 09/10/17 2159    Charlesetta Shanks, MD 09/13/17 [phone]

## 2017-09-10 NOTE — Telephone Encounter (Signed)
Patients insurance is active and benefits verified through [employer] - $45.00 co-pay, no deductible, out of pocket amount of $4,200/$590.07 has been met, no co-insurance, and no pre-authorization is required. Passport/reference [phone]  Will contact patient to see if interested in the CR program. If interested, patient will need to complete follow up appt. Once completed, patient will be contacted for scheduling upon review by the RN Navigator.

## 2017-09-10 NOTE — ED Provider Notes (Signed)
Medical screening examination/treatment/procedure(s) were conducted as a shared visit with non-physician practitioner(s) and myself.  I personally evaluated the patient during the encounter.  EKG Interpretation  Date/Time:  Monday September 10 2017 12:02:28 EDT Ventricular Rate:  113 PR Interval:    QRS Duration: 88 QT Interval:  334 QTC Calculation: 458 R Axis:   75 Text Interpretation:  Atrial fibrillation with rapid ventricular response Cannot rule out Anterior infarct , age undetermined ST & T wave abnormality, consider lateral ischemia Abnormal ECG lateral t wave inversion compared to previous Confirmed by Charlesetta Shanks [phone]) on 09/10/2017 3:33:31 PM Status post TAVR 3/19.  He was doing well at home after discharge.  His evening before presentation to start developed nausea vomiting and diarrhea.  Patient does not have chest pain, abdominal pain or shortness of breath.  Patient is fatigued in appearance but alert and appropriate.  No respiratory distress at rest.  Heart irregularly irregular.  9-1\6 systolic ejection murmur.  Lungs are grossly clear.  Abdomen is nontender.  Movements are coordinated purposeful and symmetric.  I agree with plan of management.   Charlesetta Shanks, MD 09/13/17 [phone]

## 2017-09-10 NOTE — H&P (Signed)
PCP:   [name], DO   Chief Complaint:  Nausea, vomiting  HPI: This is a 84 year old gentleman status post TVR 6 days ago. He has been doing well but last night at midnight he developed nausea and vomiting. He had approximately 8 emesis. His last emesis was at 6 AM this morning. At approximately 7 AM he had a large incontinent BM, he has not had a recurrence since. Post BM he was weak, somewhat disoriented and dehydrated appearing. His wife has not given him his blood pressure medications today because of his GI symptoms. She spoke with her PCP who directed them to the ER. Here in the ER the patient appears weak and a bit dehydrated. Cardiology service was contacted and they recommended overnight observation given his recent surgery and gastroenteritis. They're concerned for dehydration. They want to see the patient in the a.m. History provided by the patient as well as his wife is present at bedside.   Review of Systems:  The patient denies anorexia, fever, weight loss,, vision loss, decreased hearing, hoarseness, chest pain, syncope, dyspnea on exertion, peripheral edema, balance deficits, hemoptysis, abdominal pain, nausea, vomiting, diarrhea, melena, hematochezia, severe indigestion/heartburn, hematuria, incontinence, genital sores, muscle weakness, suspicious skin lesions, transient blindness, difficulty walking, depression, unusual weight change, abnormal bleeding, enlarged lymph nodes, angioedema, and breast masses.  Past Medical History: Past Medical History:  Diagnosis Date  . Aortic stenosis   . Arthritis   . Atrial fibrillation (HCC)    Chronic  . Atrial fibrillation, chronic ([place])   . BPH (benign prostatic hyperplasia)   . CAD (coronary artery disease)   . Chronic anticoagulation   . History of chicken pox   . HTN (hypertension)   . Mitral stenosis   . Old MI (myocardial infarction) 2007   s/p PCI to distal OM (no stent)  . Severe mitral regurgitation   . Stroke,  embolic [facility])    8185-6 weeks after his heart attack   Past Surgical History:  Procedure Laterality Date  . CORONARY ANGIOPLASTY  2007   Distal OM  . PROSTATE BIOPSY    . RIGHT/LEFT HEART CATH AND CORONARY ANGIOGRAPHY N/A 08/01/2017   Procedure: RIGHT/LEFT HEART CATH AND CORONARY ANGIOGRAPHY;  Surgeon: Sherren Mocha, MD;  Location: [place] [facility];  Service: Cardiovascular;  Laterality: N/A;  . TEE WITHOUT CARDIOVERSION N/A 07/20/2017   Procedure: TRANSESOPHAGEAL ECHOCARDIOGRAM (TEE);  Surgeon: Lelon Perla, MD;  Location: [facility] ENDOSCOPY;  Service: Cardiovascular;  Laterality: N/A;  . TEE WITHOUT CARDIOVERSION N/A 09/04/2017   Procedure: TRANSESOPHAGEAL ECHOCARDIOGRAM (TEE);  Surgeon: Sherren Mocha, MD;  Location: [place];  Service: Open Heart Surgery;  Laterality: N/A;  . TRANSCATHETER AORTIC VALVE REPLACEMENT, TRANSFEMORAL N/A 09/04/2017   Procedure: TRANSCATHETER AORTIC VALVE REPLACEMENT, TRANSFEMORAL;  Surgeon: Sherren Mocha, MD;  Location: [place];  Service: Open Heart Surgery;  Laterality: N/A;    Medications: Prior to Admission medications   Medication Sig Start Date End Date Taking? Authorizing Provider  acetaminophen (TYLENOL) 500 MG tablet Take 1,000 mg by mouth every 6 (six) hours as needed for moderate pain or headache.   Yes [provider]  alfuzosin (UROXATRAL) 10 MG 24 hr tablet Take 10 mg by mouth daily with breakfast.   Yes [provider]  ARTIFICIAL TEAR OP Place 1 drop into both eyes daily as needed (dry eyes).   Yes [provider]  aspirin EC 81 MG EC tablet Take 1 tablet (81 mg total) by mouth daily. 09/07/17 12/06/17 Yes Kathyrn Drown  D, NP  calcium carbonate (TUMS - DOSED IN MG ELEMENTAL CALCIUM) 500 MG chewable tablet Chew 1-2 tablets by mouth daily as needed for indigestion or heartburn.   Yes [provider]  cholecalciferol 2000 units TABS Take 1 tablet (2,000 Units total) by mouth daily. 05/24/17  Yes Regalado, Belkys A, MD   Dextromethorphan-Guaifenesin (MUCINEX DM) 30-600 MG TB12 Take 1-2 tablets by mouth every 12 (twelve) hours as needed (for cough/congestion.).   Yes [provider]  digoxin (LANOXIN) 0.125 MG tablet TAKE ONE TABLET BY MOUTH ONCE DAILY Patient taking differently: TAKE ONE TABLET (12mcg) BY MOUTH ONCE DAILY 03/13/17  Yes Lelon Perla, MD  finasteride (PROSCAR) 5 MG tablet Take 5 mg by mouth at bedtime.  02/14/17  Yes Lelon Perla, MD  furosemide (LASIX) 40 MG tablet Take 1 tablet (40 mg total) by mouth daily. 09/07/17  Yes Kathyrn Drown D, NP  Melatonin 1 MG TABS Take 2 mg by mouth at bedtime.    Yes [provider]  Menthol, Topical Analgesic, (BIOFREEZE EX) Apply 1 application topically 3 (three) times daily as needed (muscle pain in back). alterrnates between [employer] and Blu Emu   Yes [provider]  Menthol, Topical Analgesic, (BLUE-EMU MAXIMUM STRENGTH EX) Apply 1 application topically 3 (three) times daily as needed (muscle pain in back). alterrnates between [employer] and [employer]mu   Yes [provider]  methocarbamol (ROBAXIN) 500 MG tablet Take 500 mg by mouth daily as needed (cramps).    Yes [provider]  metoprolol tartrate (LOPRESSOR) 25 MG tablet Take 0.5 tablets (12.5 mg total) by mouth 2 (two) times daily. 09/06/17  Yes Kathyrn Drown D, NP  polyethylene glycol (MIRALAX / GLYCOLAX) packet Take 17 g by mouth daily.    Yes [provider]  potassium chloride (K-DUR,KLOR-CON) 10 MEQ tablet Take 1 tablet (10 mEq total) by mouth daily. 09/07/17  Yes Kathyrn Drown D, NP  sodium chloride (OCEAN) 0.65 % SOLN nasal spray Place 1 spray into both nostrils daily as needed for congestion.   Yes [provider]  triamcinolone cream (KENALOG) 0.1 % Apply 1 application topically daily as needed (dry skin).    Yes [provider]  warfarin (COUMADIN) 2.5 MG tablet TAKE 1 TO 2 TABLETS BY MOUTH ONCE DAILY AS DIRECTED BY   COUMADIN  CLINIC Patient taking differently: Take 2.5-5 mg by mouth See admin instructions. TAKE 1 TABLET (2.5 MG) BY MOUTH ON TUESDAYS AND SATURDAYS TAKE 2 TABLETS (5 MG) BY MOUTH ON SUNDAY, MONDAY, [place], THURSDAYS, AND FRIDAYS. 07/24/17  Yes Crenshaw, Denice Bors, MD    Allergies:   Allergies  Allergen Reactions  . Penicillins Rash and Other (See Comments)    PATIENT HAS HAD A PCN REACTION WITH IMMEDIATE RASH, FACIAL/TONGUE/THROAT SWELLING, SOB, OR LIGHTHEADEDNESS WITH HYPOTENSION:  #  #  #  YES  #  #  #   Has patient had a PCN reaction causing severe rash involving mucus membranes or skin necrosis: No Has patient had a PCN reaction that required hospitalization No Has patient had a PCN reaction occurring within the last 10 years: No If all of the above answers are "NO", then may proceed with Cephalosporin use.  . Prednisone Other (See Comments)    Wheezing  . Procaine Hcl Other (See Comments)    Passed out after being given this at dental appointment  . Levofloxacin Rash  . Oxycodone Other (See Comments)    constipation  Social History:  reports that he has never smoked. He has never used smokeless tobacco. He reports that he does not drink alcohol or use drugs.  Family History: Family History  Problem Relation Age of Onset  . Heart disease Mother   . Heart attack Mother   . Heart disease Father   . Heart attack Father   . Heart attack Brother   . Heart attack Sister   . Stroke Brother     Physical Exam: Vitals:   09/10/17 1730 09/10/17 1800 09/10/17 1815 09/10/17 1830  BP: 107/60 106/64 109/67 109/72  Pulse: 87 91 87 97  Resp: 17 15 20 19   Temp:      TempSrc:      SpO2: 97% 95% 94% 96%  Weight:      Height:        General:  Alert and oriented times three, well developed and nourished, no acute distress, weak Eyes: PERRLA, pink conjunctiva, no scleral icterus ENT: dry oral mucosa, neck supple, no thyromegaly Lungs: clear to ascultation, no wheeze, no  crackles, no use of accessory muscles Cardiovascular: regular rate and rhythm, no regurgitation, no gallops, no murmurs. No carotid bruits, no JVD Abdomen: soft, positive BS, non-tender, non-distended, no organomegaly, not an acute abdomen GU: not examined Neuro: CN II - XII grossly intact, sensation intact Musculoskeletal: strength 5/5 all extremities, no clubbing, cyanosis or edema Skin: no rash, no subcutaneous crepitation, no decubitus Psych: appropriate patient   Labs on Admission:  Recent Labs    09/10/17 1150  NA 138  K 4.0  CL 103  CO2 24  GLUCOSE 125*  BUN 18  CREATININE 0.79  CALCIUM 8.6*   Recent Labs    09/10/17 1150  AST 28  ALT 13*  ALKPHOS 50  BILITOT 0.9  PROT 6.9  ALBUMIN 3.5   No results for input(s): LIPASE, AMYLASE in the last 72 hours. Recent Labs    09/10/17 1150  WBC 8.6  NEUTROABS 8.0*  HGB 11.8*  HCT 37.0*  MCV 88.9  PLT 198   No results for input(s): CKTOTAL, CKMB, CKMBINDEX, TROPONINI in the last 72 hours. Invalid input(s): POCBNP No results for input(s): DDIMER in the last 72 hours. No results for input(s): HGBA1C in the last 72 hours. No results for input(s): CHOL, HDL, LDLCALC, TRIG, CHOLHDL, LDLDIRECT in the last 72 hours. No results for input(s): TSH, T4TOTAL, T3FREE, THYROIDAB in the last 72 hours.  Invalid input(s): FREET3 No results for input(s): VITAMINB12, FOLATE, FERRITIN, TIBC, IRON, RETICCTPCT in the last 72 hours.  Micro Results: No results found for this or any previous visit (from the past 240 hour(s)).   Radiological Exams on Admission: Dg Chest 2 View  Result Date: 09/10/2017 CLINICAL DATA:  Pt had heart valve replaced on 3/19 and went home from the hospital on Thursday 3/21. Pt was doing well until last night he began to having vomiting x7 and diarrhea x1 that started this morning. Pt appears to be weak and pale. EXAM: CHEST - 2 VIEW COMPARISON:  09/04/2017 FINDINGS: Cardiac silhouette is mildly enlarged.  Prosthetic aortic valve is stable in the prior exam. No mediastinal widening. No mediastinal or hilar masses or evidence of adenopathy. There are small residual pleural effusions. No pneumothorax. Lungs are hyperexpanded. No evidence of pneumonia or pulmonary edema. Pulmonary interstitial edema seen on the prior exam has resolved. Skeletal structures are intact. IMPRESSION: 1. No acute cardiopulmonary disease. 2. Small residual pleural effusions, but no evidence of pulmonary edema. No  pneumonia. Electronically Signed   By: Lajean Manes M.D.   On: 09/10/2017 18:05    Assessment/Plan Present on Admission: . Gastroenteritis -admit to med tele -GI Biofire ordered, plus C diff toxin if further BMs -NS hydration ordered -antiemetics PRN  S/P recent TAVR -Cardiology Dr Marlou Porch and CTS Dr V contacted and will see patient in AM -coumadin continued, pharmacy to dose  . CAD (coronary artery disease) -stable, home meds resumed  . Chronic atrial fibrillation (HCC) -stable, home meds resumed  . h/o CVA (cerebrovascular accident due to intracerebral hemorrhage) -aware, home meds resumed  . HTN (hypertension) -stable, home meds resumed with hold parameters   Matrice Herro 09/10/2017, 7:13 PM

## 2017-09-10 NOTE — ED Triage Notes (Signed)
Pt had heart valve replaced on 3/19 and went home from the hospital on Thursday 3/21. Pt was doing well until last night he began to having vomiting x7 and diarrhea x1 that started this morning. Pt appears to be weak and pale.

## 2017-09-10 NOTE — Progress Notes (Signed)
ANTICOAGULATION CONSULT NOTE - Initial Consult  Pharmacy Consult:  Coumadin Indication:  TAVR  Allergies  Allergen Reactions  . Penicillins Rash and Other (See Comments)    PATIENT HAS HAD A PCN REACTION WITH IMMEDIATE RASH, FACIAL/TONGUE/THROAT SWELLING, SOB, OR LIGHTHEADEDNESS WITH HYPOTENSION:  #  #  #  YES  #  #  #   Has patient had a PCN reaction causing severe rash involving mucus membranes or skin necrosis: No Has patient had a PCN reaction that required hospitalization No Has patient had a PCN reaction occurring within the last 10 years: No If all of the above answers are "NO", then may proceed with Cephalosporin use.  . Prednisone Other (See Comments)    Wheezing  . Procaine Hcl Other (See Comments)    Passed out after being given this at dental appointment  . Levofloxacin Rash  . Oxycodone Other (See Comments)    constipation    Patient Measurements: Height: 5' 10.5" (179.1 cm) Weight: 170 lb (77.1 kg) IBW/kg (Calculated) : 74.15  Vital Signs: Temp: 98.3 F (36.8 C) (03/25 1144) Temp Source: Oral (03/25 1144) BP: 106/63 (03/25 1938) Pulse Rate: 83 (03/25 1938)  Labs: Recent Labs    09/10/17 1150  HGB 11.8*  HCT 37.0*  PLT 198  LABPROT 19.5*  INR 1.66  CREATININE 0.79    Estimated Creatinine Clearance: 69.6 mL/min (by C-G formula based on SCr of 0.79 mg/dL).   Medical History: Past Medical History:  Diagnosis Date  . Aortic stenosis   . Arthritis   . Atrial fibrillation (HCC)    Chronic  . Atrial fibrillation, chronic ([place])   . BPH (benign prostatic hyperplasia)   . CAD (coronary artery disease)   . Chronic anticoagulation   . History of chicken pox   . HTN (hypertension)   . Mitral stenosis   . Old MI (myocardial infarction) 2007   s/p PCI to distal OM (no stent)  . Severe mitral regurgitation   . Stroke, embolic ([place])    3212-2 weeks after his heart attack      Assessment: 73 YOM with history of Afib and recent TAVR presented with  nausea and vomiting.  Pharmacy has been consulted to continue Coumadin from PTA.  INR currently sub-therapeutic; no bleeding reported.  Home regimen:  Coumadin 5mg  PO daily except 2.5mg  on Tues and Sat   Goal of Therapy:  INR 2-3 Monitor platelets by anticoagulation protocol: Yes    Plan:  Coumadin 5mg  PO today Daily PT / INR   Bryam Taborda D. Mina Marble, PharmD, BCPS Pager:  [phone] 09/10/2017, 8:57 PM

## 2017-09-11 ENCOUNTER — Telehealth: Payer: Self-pay | Admitting: Cardiovascular Disease

## 2017-09-11 DIAGNOSIS — K529 Noninfective gastroenteritis and colitis, unspecified: Secondary | ICD-10-CM | POA: Diagnosis not present

## 2017-09-11 LAB — BASIC METABOLIC PANEL
Anion gap: 8 (ref 5–15)
BUN: 14 mg/dL (ref 6–20)
CALCIUM: 8 mg/dL — AB (ref 8.9–10.3)
CHLORIDE: 102 mmol/L (ref 101–111)
CO2: 26 mmol/L (ref 22–32)
CREATININE: 0.65 mg/dL (ref 0.61–1.24)
GFR calc non Af Amer: >60 mL/min (ref >60)
Glucose, Bld: 87 mg/dL (ref 65–99)
Potassium: 3.7 mmol/L (ref 3.5–5.1)
Sodium: 136 mmol/L (ref 135–145)

## 2017-09-11 LAB — PROTIME-INR
INR: 1.52
PROTHROMBIN TIME: 18.2 s — AB (ref 11.4–15.2)

## 2017-09-11 LAB — CBC
HCT: 33.5 % — ABNORMAL LOW (ref 39.0–52.0)
Hemoglobin: 10.5 g/dL — ABNORMAL LOW (ref 13.0–17.0)
MCH: 28.3 pg (ref 26.0–34.0)
MCHC: 31.3 g/dL (ref 30.0–36.0)
MCV: 90.3 fL (ref 78.0–100.0)
PLATELETS: 162 10*3/uL (ref 150–400)
RBC: 3.71 MIL/uL — AB (ref 4.22–5.81)
RDW: 16.9 % — ABNORMAL HIGH (ref 11.5–15.5)
WBC: 5.6 10*3/uL (ref 4.0–10.5)

## 2017-09-11 MED ORDER — WARFARIN SODIUM 5 MG PO TABS
5.0000 mg | ORAL_TABLET | Freq: Once | ORAL | Status: AC
  Administered 2017-09-11: 5 mg via ORAL
  Filled 2017-09-11: qty 1

## 2017-09-11 NOTE — Telephone Encounter (Signed)
The pt's daughter in [place] left me a voicemail [phone], Almyra Free) to inquire if Dr Burt Knack is aware that the pt was readmitted to the hospital yesterday.  I made her aware that Dr Burt Knack is aware and plans to check on the pt.  She states this would make the pt and spouse feel reassured that cardiology is aware of hospitalization.  She states the pt has not been given food in a day and a half and wonders when he will get to eat.  I made her aware that the attending MD will make this determination.  She is aware that her sister Eustaquio Maize had contacted the [facility] office and she plans to make Beth aware that I have already contacted her in regards to pt.

## 2017-09-11 NOTE — Progress Notes (Signed)
Patient arrived to the unit via bed from [place] [place] 5C.  Patient is alert and oriented x 4. No complaints of pain. Skin assessment performed.  Surgical incision to the right and left groin with liquid adhesion.  Ecchymosis  to the left hip and bilateral arms.  Educated the patient on how to reach the staff on the unit.  Lowered the bed activated the bed alarm and placed the call light within reach.  Will continue to monitor the patient

## 2017-09-11 NOTE — Telephone Encounter (Signed)
I visited with the patient this evening. He seems to be improving. thx

## 2017-09-11 NOTE — Progress Notes (Signed)
Patient ambulated in the hall ways with front wheel walker,one person assist.

## 2017-09-11 NOTE — Progress Notes (Signed)
[facility]  Patient Status: Active (receiving services up to time of hospitalization)  [facility] is providing the following services: PT  If patient discharges after hours, please call [phone].   Wayne [name] 09/11/2017, 10:16 AM

## 2017-09-11 NOTE — Telephone Encounter (Signed)
NEW MESSAGE    Patient's daughter calling with concerns about the care her father is getting. Requesting call from TAVR nurse.

## 2017-09-11 NOTE — Progress Notes (Signed)
ANTICOAGULATION CONSULT NOTE - Follow-Up Consult  Pharmacy Consult:  Coumadin Indication:  TAVR  Allergies  Allergen Reactions  . Penicillins Rash and Other (See Comments)    PATIENT HAS HAD A PCN REACTION WITH IMMEDIATE RASH, FACIAL/TONGUE/THROAT SWELLING, SOB, OR LIGHTHEADEDNESS WITH HYPOTENSION:  #  #  #  YES  #  #  #   Has patient had a PCN reaction causing severe rash involving mucus membranes or skin necrosis: No Has patient had a PCN reaction that required hospitalization No Has patient had a PCN reaction occurring within the last 10 years: No If all of the above answers are "NO", then may proceed with Cephalosporin use.  . Prednisone Other (See Comments)    Wheezing  . Procaine Hcl Other (See Comments)    Passed out after being given this at dental appointment  . Levofloxacin Rash  . Oxycodone Other (See Comments)    constipation    Patient Measurements: Height: 5\' 11"  (180.3 cm) Weight: 161 lb 6 oz (73.2 kg) IBW/kg (Calculated) : 75.3  Vital Signs: Temp: 98.4 F (36.9 C) (03/26 0600) Temp Source: Oral (03/26 0600) BP: 115/58 (03/26 0954) Pulse Rate: 72 (03/26 0954)  Labs: Recent Labs    09/10/17 1150 09/11/17 0527  HGB 11.8* 10.5*  HCT 37.0* 33.5*  PLT 198 162  LABPROT 19.5* 18.2*  INR 1.66 1.52  CREATININE 0.79 0.65    Estimated Creatinine Clearance: 68.6 mL/min (by C-G formula based on SCr of 0.65 mg/dL).   Medical History: Past Medical History:  Diagnosis Date  . Aortic stenosis   . Arthritis   . Atrial fibrillation (HCC)    Chronic  . Atrial fibrillation, chronic ([place])   . BPH (benign prostatic hyperplasia)   . CAD (coronary artery disease)   . Chronic anticoagulation   . History of chicken pox   . HTN (hypertension)   . Mitral stenosis   . Old MI (myocardial infarction) 2007   s/p PCI to distal OM (no stent)  . Severe mitral regurgitation   . Stroke, embolic [facility])    7209-4 weeks after his heart attack      Assessment: 84 yo M  with history of Afib and recent TAVR presented with nausea and vomiting.  Pharmacy has been consulted to continue Coumadin from PTA.  INR currently sub-therapeutic; no bleeding reported.  Home regimen:  Coumadin 5mg  PO daily except 2.5mg  on Tues and Sat   Goal of Therapy:  INR 2-3 Monitor platelets by anticoagulation protocol: Yes    Plan:  Coumadin 5mg  PO today Daily PT / INR  [employer], Pharm.D., BCPS Clinical Pharmacist Pager: [phone] Clinical phone for 09/11/2017 from 8:30-4:00 is [phone]. After 4pm, please call Main Rx [phone]) for assistance. 09/11/2017 2:39 PM

## 2017-09-11 NOTE — Progress Notes (Signed)
PROGRESS NOTE    Wayne [name]  HBZ:[id number] DOB: [birth year] DOA: 09/10/2017 PCP: [name], DO    Brief Narrative: This is a 84 year old gentleman status post TVR 6 days ago. He has been doing well but last night at midnight he developed nausea and vomiting. He had approximately 8 emesis. His last emesis was at 6 AM this morning. At approximately 7 AM he had a large incontinent BM, he has not had a recurrence since. Post BM he was weak, somewhat disoriented and dehydrated appearing. His wife has not given him his blood pressure medications today because of his GI symptoms. She spoke with her PCP who directed them to the ER. Here in the ER the patient appears weak and a bit dehydrated. Cardiology service was contacted and they recommended overnight observation given his recent surgery and gastroenteritis. They're concerned for dehydration. They want to see the patient in the a.m. History provided by the patient as well as his wife is present at bedside.    Assessment & Plan:   Principal Problem:   Gastroenteritis Active Problems:   Chronic atrial fibrillation (HCC)   h/o CVA (cerebrovascular accident due to intracerebral hemorrhage)   Chronic anticoagulation   HTN (hypertension)   CAD (coronary artery disease)   Chronic diastolic CHF (congestive heart failure) (HCC)   S/P TAVR (transcatheter aortic valve replacement)   1-Gastroenteritis;  Continue with IV fluids.  Start clear diet and advanced as tolerated.   2-S/P recent TAVR;  Cardiology informed of admission.  Continue with coumadin.  3-CAD;  On aspirin, metoprolol.   Chronic A fib;  On digoxin, metoprolol. Coumadin.   H/O CVA;  Coumadin.   HTN;  Metoprolol.    DVT prophylaxis: coumadin  Code Status:  Full code.  Family Communication: care discussed with patient.  Disposition Plan: home when he tolerates diet   Consultants:   Cardiology  CVTS   Procedures: none   Antimicrobials:     Subjective: He report resolution of vomiting.  Had two BM prior to admission.  No further BM.    Objective: Vitals:   09/11/17 0105 09/11/17 0600 09/11/17 0954 09/11/17 1514  BP: 111/61 (!) 117/53 (!) 115/58 127/73  Pulse: 87 74 72 78  Resp: 20 16  18   Temp: 97.7 F (36.5 C) 98.4 F (36.9 C)    TempSrc: Oral Oral    SpO2: 94% 94%  97%  Weight: 73.2 kg (161 lb 6 oz)     Height: 5\' 11"  (1.803 m)       Intake/Output Summary (Last 24 hours) at 09/11/2017 1544 Last data filed at 09/11/2017 0900 Gross per 24 hour  Intake 537.5 ml  Output 720 ml  Net -182.5 ml   Filed Weights   09/10/17 1436 09/11/17 0105  Weight: 77.1 kg (170 lb) 73.2 kg (161 lb 6 oz)    Examination:  General exam: Appears calm and comfortable  Respiratory system: Clear to auscultation. Respiratory effort normal. Cardiovascular system: S1 & S2 heard, RRR. No JVD, murmurs, rubs, gallops or clicks. No pedal edema. Gastrointestinal system: Abdomen is nondistended, soft and nontender. No organomegaly or masses felt. Normal bowel sounds heard. Central nervous system: Alert and oriented. No focal neurological deficits. Extremities: Symmetric 5 x 5 power. Skin: No rashes, lesions or ulcers    Data Reviewed: I have personally reviewed following labs and imaging studies  CBC: Recent Labs  Lab 09/05/17 0337 09/06/17 0259 09/10/17 1150 09/11/17 0527  WBC 8.2 8.6 8.6 5.6  NEUTROABS  --   --  8.0*  --   HGB 9.6* 9.8* 11.8* 10.5*  HCT 30.0* 30.9* 37.0* 33.5*  MCV 88.8 89.8 88.9 90.3  PLT 180 180 198 254   Basic Metabolic Panel: Recent Labs  Lab 09/05/17 0337 09/06/17 0259 09/10/17 1150 09/11/17 0527  NA 132* 134* 138 136  K 4.2 4.0 4.0 3.7  CL 100* 98* 103 102  CO2 25 26 24 26   GLUCOSE 111* 86 125* 87  BUN 10 16 18 14   CREATININE 0.70 0.81 0.79 0.65  CALCIUM 8.3* 8.1* 8.6* 8.0*   GFR: Estimated Creatinine Clearance: 68.6 mL/min (by C-G formula based on SCr of 0.65 mg/dL). Liver Function  Tests: Recent Labs  Lab 09/10/17 1150  AST 28  ALT 13*  ALKPHOS 50  BILITOT 0.9  PROT 6.9  ALBUMIN 3.5   No results for input(s): LIPASE, AMYLASE in the last 168 hours. No results for input(s): AMMONIA in the last 168 hours. Coagulation Profile: Recent Labs  Lab 09/05/17 0337 09/06/17 0259 09/10/17 1150 09/11/17 0527  INR 1.22 1.22 1.66 1.52   Cardiac Enzymes: No results for input(s): CKTOTAL, CKMB, CKMBINDEX, TROPONINI in the last 168 hours. BNP (last 3 results) No results for input(s): PROBNP in the last 8760 hours. HbA1C: No results for input(s): HGBA1C in the last 72 hours. CBG: Recent Labs  Lab 09/04/17 1941  GLUCAP 216*   Lipid Profile: No results for input(s): CHOL, HDL, LDLCALC, TRIG, CHOLHDL, LDLDIRECT in the last 72 hours. Thyroid Function Tests: No results for input(s): TSH, T4TOTAL, FREET4, T3FREE, THYROIDAB in the last 72 hours. Anemia Panel: No results for input(s): VITAMINB12, FOLATE, FERRITIN, TIBC, IRON, RETICCTPCT in the last 72 hours. Sepsis Labs: Recent Labs  Lab 09/10/17 1201 09/10/17 1449  LATICACIDVEN 1.57 1.38    No results found for this or any previous visit (from the past 240 hour(s)).       Radiology Studies: Dg Chest 2 View  Result Date: 09/10/2017 CLINICAL DATA:  Pt had heart valve replaced on 3/19 and went home from the hospital on Thursday 3/21. Pt was doing well until last night he began to having vomiting x7 and diarrhea x1 that started this morning. Pt appears to be weak and pale. EXAM: CHEST - 2 VIEW COMPARISON:  09/04/2017 FINDINGS: Cardiac silhouette is mildly enlarged. Prosthetic aortic valve is stable in the prior exam. No mediastinal widening. No mediastinal or hilar masses or evidence of adenopathy. There are small residual pleural effusions. No pneumothorax. Lungs are hyperexpanded. No evidence of pneumonia or pulmonary edema. Pulmonary interstitial edema seen on the prior exam has resolved. Skeletal structures are  intact. IMPRESSION: 1. No acute cardiopulmonary disease. 2. Small residual pleural effusions, but no evidence of pulmonary edema. No pneumonia. Electronically Signed   By: Lajean Manes M.D.   On: 09/10/2017 18:05        Scheduled Meds: . alfuzosin  10 mg Oral Q breakfast  . aspirin  81 mg Oral Daily  . digoxin  125 mcg Oral Daily  . finasteride  5 mg Oral QHS  . furosemide  40 mg Oral Daily  . Melatonin  3 mg Oral QHS  . metoprolol tartrate  12.5 mg Oral BID  . potassium chloride  10 mEq Oral Daily  . warfarin  5 mg Oral ONCE-1800  . Warfarin - Pharmacist Dosing Inpatient   Does not apply q1800   Continuous Infusions: . sodium chloride 75 mL/hr at 09/11/17 1349     LOS: 1 day    Time spent:  35 minutes.     Elmarie Shiley, MD [facility] Pager [phone]  If 7PM-7AM, please contact night-coverage [email or web address] Password [facility] 09/11/2017, 3:44 PM

## 2017-09-12 ENCOUNTER — Telehealth (HOSPITAL_COMMUNITY): Payer: Self-pay

## 2017-09-12 ENCOUNTER — Observation Stay (HOSPITAL_COMMUNITY): Payer: Medicare HMO

## 2017-09-12 DIAGNOSIS — K529 Noninfective gastroenteritis and colitis, unspecified: Secondary | ICD-10-CM | POA: Diagnosis not present

## 2017-09-12 DIAGNOSIS — R1030 Lower abdominal pain, unspecified: Secondary | ICD-10-CM | POA: Diagnosis not present

## 2017-09-12 LAB — URINALYSIS, ROUTINE W REFLEX MICROSCOPIC
BILIRUBIN URINE: NEGATIVE
GLUCOSE, UA: NEGATIVE mg/dL
Ketones, ur: NEGATIVE mg/dL
NITRITE: NEGATIVE
PH: 5 (ref 5.0–8.0)
Protein, ur: NEGATIVE mg/dL
SPECIFIC GRAVITY, URINE: 1.017 (ref 1.005–1.030)

## 2017-09-12 LAB — CBC
HCT: 35.2 % — ABNORMAL LOW (ref 39.0–52.0)
Hemoglobin: 10.9 g/dL — ABNORMAL LOW (ref 13.0–17.0)
MCH: 28 pg (ref 26.0–34.0)
MCHC: 31 g/dL (ref 30.0–36.0)
MCV: 90.5 fL (ref 78.0–100.0)
PLATELETS: 155 10*3/uL (ref 150–400)
RBC: 3.89 MIL/uL — AB (ref 4.22–5.81)
RDW: 16.6 % — AB (ref 11.5–15.5)
WBC: 9 10*3/uL (ref 4.0–10.5)

## 2017-09-12 LAB — GLUCOSE, CAPILLARY: Glucose-Capillary: 84 mg/dL (ref 65–99)

## 2017-09-12 LAB — BASIC METABOLIC PANEL
ANION GAP: 12 (ref 5–15)
BUN: 7 mg/dL (ref 6–20)
CO2: 23 mmol/L (ref 22–32)
Calcium: 7.9 mg/dL — ABNORMAL LOW (ref 8.9–10.3)
Chloride: 100 mmol/L — ABNORMAL LOW (ref 101–111)
Creatinine, Ser: 0.62 mg/dL (ref 0.61–1.24)
GFR calc Af Amer: >60 mL/min (ref >60)
GLUCOSE: 83 mg/dL (ref 65–99)
POTASSIUM: 3.6 mmol/L (ref 3.5–5.1)
SODIUM: 135 mmol/L (ref 135–145)

## 2017-09-12 LAB — DIGOXIN LEVEL

## 2017-09-12 LAB — PROTIME-INR
INR: 1.77
PROTHROMBIN TIME: 20.5 s — AB (ref 11.4–15.2)

## 2017-09-12 LAB — LIPASE, BLOOD: Lipase: 34 U/L (ref 11–51)

## 2017-09-12 LAB — MAGNESIUM: MAGNESIUM: 1.8 mg/dL (ref 1.7–2.4)

## 2017-09-12 MED ORDER — WARFARIN SODIUM 5 MG PO TABS
5.0000 mg | ORAL_TABLET | Freq: Once | ORAL | Status: AC
  Administered 2017-09-12: 5 mg via ORAL
  Filled 2017-09-12: qty 1

## 2017-09-12 MED ORDER — SULFAMETHOXAZOLE-TRIMETHOPRIM 800-160 MG PO TABS: 1.0000 | ORAL_TABLET | Freq: Two times a day (BID) | ORAL | Status: DC

## 2017-09-12 MED ORDER — ONDANSETRON HCL 4 MG/2ML IJ SOLN
4.0000 mg | Freq: Four times a day (QID) | INTRAMUSCULAR | Status: DC | PRN
  Administered 2017-09-12 (×2): 4 mg via INTRAVENOUS
  Filled 2017-09-12 (×2): qty 2

## 2017-09-12 MED ORDER — BACITRACIN ZINC 500 UNIT/GM EX OINT
TOPICAL_OINTMENT | CUTANEOUS | Status: AC
Start: 2017-09-12 — End: 2017-09-12
  Filled 2017-09-12: qty 28.35

## 2017-09-12 MED ORDER — POTASSIUM CHLORIDE CRYS ER 20 MEQ PO TBCR
20.0000 meq | EXTENDED_RELEASE_TABLET | Freq: Once | ORAL | Status: AC
  Administered 2017-09-12: 20 meq via ORAL
  Filled 2017-09-12: qty 1

## 2017-09-12 MED ORDER — ENSURE ENLIVE PO LIQD
237.0000 mL | Freq: Two times a day (BID) | ORAL | Status: DC
  Administered 2017-09-12 – 2017-09-15 (×6): 237 mL via ORAL

## 2017-09-12 MED ORDER — SODIUM CHLORIDE 0.9 % IV BOLUS
250.0000 mL | Freq: Once | INTRAVENOUS | Status: AC
  Administered 2017-09-12: 250 mL via INTRAVENOUS

## 2017-09-12 MED ORDER — SODIUM CHLORIDE 0.9 % IV SOLN
1.0000 g | INTRAVENOUS | Status: DC
  Administered 2017-09-12 – 2017-09-13 (×2): 1 g via INTRAVENOUS
  Filled 2017-09-12 (×4): qty 10

## 2017-09-12 MED ORDER — PANTOPRAZOLE SODIUM 40 MG IV SOLR
40.0000 mg | Freq: Two times a day (BID) | INTRAVENOUS | Status: DC
  Administered 2017-09-12 – 2017-09-15 (×7): 40 mg via INTRAVENOUS
  Filled 2017-09-12 (×7): qty 40

## 2017-09-12 NOTE — Progress Notes (Signed)
PROGRESS NOTE    Wayne [name]  UKG:[id number] DOB: [birth year] DOA: 09/10/2017 PCP: [name], DO    Brief Narrative: This is a 84 year old gentleman status post TVR 6 days ago. He has been doing well but last night at midnight he developed nausea and vomiting. He had approximately 8 emesis. His last emesis was at 6 AM this morning. At approximately 7 AM he had a large incontinent BM, he has not had a recurrence since. Post BM he was weak, somewhat disoriented and dehydrated appearing. His wife has not given him his blood pressure medications today because of his GI symptoms. She spoke with her PCP who directed them to the ER. Here in the ER the patient appears weak and a bit dehydrated. Cardiology service was contacted and they recommended overnight observation given his recent surgery and gastroenteritis. They're concerned for dehydration. They want to see the patient in the a.m. History provided by the patient as well as his wife is present at bedside.    Assessment & Plan:   Principal Problem:   Gastroenteritis Active Problems:   Chronic atrial fibrillation (HCC)   h/o CVA (cerebrovascular accident due to intracerebral hemorrhage)   Chronic anticoagulation   HTN (hypertension)   CAD (coronary artery disease)   Chronic diastolic CHF (congestive heart failure) (HCC)   S/P TAVR (transcatheter aortic valve replacement)   1-Gastroenteritis;  Continue with IV fluids.  Now on dysphagia diet. Report some nausea.  No further diarrhea.   2-S/P recent TAVR;  Cardiology informed of admission.  Continue with coumadin.  3-Nausea, supra-pubic pain, dysuria.  Will check UA, urine culture. KUB negative.  IV fluids. IV protonix.  LFT on admission normal.   Hypotension; asymptomatic. IV bolus, IV fluids.   CAD;  On aspirin, metoprolol.   Chronic A fib;  On digoxin, metoprolol. Coumadin.   H/O CVA;  Coumadin.   HTN;  Metoprolol.    DVT prophylaxis: coumadin  Code  Status:  Full code.  Family Communication: care discussed with patient.  Disposition Plan: home when he tolerates diet   Consultants:   Cardiology  CVTS   Procedures: none   Antimicrobials:    Subjective: He report nausea this am. Report supra-pubic pain and dysuria.  Feeling weak.    Objective: Vitals:   09/11/17 1514 09/11/17 2120 09/12/17 0537 09/12/17 0827  BP: 127/73 118/60 (!) 92/38 (!) 98/46  Pulse: 78 71 72   Resp: 18 18 18    Temp:  98.4 F (36.9 C) 98.1 F (36.7 C)   TempSrc:  Oral Oral   SpO2: 97% 94% 93%   Weight:      Height:        Intake/Output Summary (Last 24 hours) at 09/12/2017 0900 Last data filed at 09/12/2017 7628 Gross per 24 hour  Intake -  Output 850 ml  Net -850 ml   Filed Weights   09/10/17 1436 09/11/17 0105  Weight: 77.1 kg (170 lb) 73.2 kg (161 lb 6 oz)    Examination:  General exam: NAD Respiratory system: Respiratory effort normal CTA Cardiovascular system: S 1, S 2 RRR Gastrointestinal system: BS present, soft, nt Central nervous system: alert, non focal./ . Extremities: symmetric power.  Skin: No rash     Data Reviewed: I have personally reviewed following labs and imaging studies  CBC: Recent Labs  Lab 09/06/17 0259 09/10/17 1150 09/11/17 0527  WBC 8.6 8.6 5.6  NEUTROABS  --  8.0*  --   HGB 9.8* 11.8* 10.5*  HCT  30.9* 37.0* 33.5*  MCV 89.8 88.9 90.3  PLT 180 198 338   Basic Metabolic Panel: Recent Labs  Lab 09/06/17 0259 09/10/17 1150 09/11/17 0527  NA 134* 138 136  K 4.0 4.0 3.7  CL 98* 103 102  CO2 26 24 26   GLUCOSE 86 125* 87  BUN 16 18 14   CREATININE 0.81 0.79 0.65  CALCIUM 8.1* 8.6* 8.0*   GFR: Estimated Creatinine Clearance: 68.6 mL/min (by C-G formula based on SCr of 0.65 mg/dL). Liver Function Tests: Recent Labs  Lab 09/10/17 1150  AST 28  ALT 13*  ALKPHOS 50  BILITOT 0.9  PROT 6.9  ALBUMIN 3.5   No results for input(s): LIPASE, AMYLASE in the last 168 hours. No results  for input(s): AMMONIA in the last 168 hours. Coagulation Profile: Recent Labs  Lab 09/06/17 0259 09/10/17 1150 09/11/17 0527 09/12/17 0515  INR 1.22 1.66 1.52 1.77   Cardiac Enzymes: No results for input(s): CKTOTAL, CKMB, CKMBINDEX, TROPONINI in the last 168 hours. BNP (last 3 results) No results for input(s): PROBNP in the last 8760 hours. HbA1C: No results for input(s): HGBA1C in the last 72 hours. CBG: Recent Labs  Lab 09/12/17 0838  GLUCAP 84   Lipid Profile: No results for input(s): CHOL, HDL, LDLCALC, TRIG, CHOLHDL, LDLDIRECT in the last 72 hours. Thyroid Function Tests: No results for input(s): TSH, T4TOTAL, FREET4, T3FREE, THYROIDAB in the last 72 hours. Anemia Panel: No results for input(s): VITAMINB12, FOLATE, FERRITIN, TIBC, IRON, RETICCTPCT in the last 72 hours. Sepsis Labs: Recent Labs  Lab 09/10/17 1201 09/10/17 1449  LATICACIDVEN 1.57 1.38    No results found for this or any previous visit (from the past 240 hour(s)).       Radiology Studies: Dg Chest 2 View  Result Date: 09/10/2017 CLINICAL DATA:  Pt had heart valve replaced on 3/19 and went home from the hospital on Thursday 3/21. Pt was doing well until last night he began to having vomiting x7 and diarrhea x1 that started this morning. Pt appears to be weak and pale. EXAM: CHEST - 2 VIEW COMPARISON:  09/04/2017 FINDINGS: Cardiac silhouette is mildly enlarged. Prosthetic aortic valve is stable in the prior exam. No mediastinal widening. No mediastinal or hilar masses or evidence of adenopathy. There are small residual pleural effusions. No pneumothorax. Lungs are hyperexpanded. No evidence of pneumonia or pulmonary edema. Pulmonary interstitial edema seen on the prior exam has resolved. Skeletal structures are intact. IMPRESSION: 1. No acute cardiopulmonary disease. 2. Small residual pleural effusions, but no evidence of pulmonary edema. No pneumonia. Electronically Signed   By: Lajean Manes M.D.   On:  09/10/2017 18:05        Scheduled Meds: . alfuzosin  10 mg Oral Q breakfast  . aspirin  81 mg Oral Daily  . digoxin  125 mcg Oral Daily  . finasteride  5 mg Oral QHS  . Melatonin  3 mg Oral QHS  . metoprolol tartrate  12.5 mg Oral BID  . potassium chloride  10 mEq Oral Daily  . Warfarin - Pharmacist Dosing Inpatient   Does not apply q1800   Continuous Infusions: . sodium chloride 50 mL/hr at 09/12/17 0724  . sodium chloride 250 mL (09/12/17 0837)     LOS: 1 day    Time spent: 35 minutes.     Elmarie Shiley, MD [facility] Pager [phone]  If 7PM-7AM, please contact night-coverage [email or web address] Password [email or web address] 09/12/2017, 9:00 AM

## 2017-09-12 NOTE — Evaluation (Signed)
Physical Therapy Evaluation Patient Details Name: Wayne [name] MRN: [id number] DOB: [birth year] Today's Date: 09/12/2017   History of Present Illness  84 y.o. male admitted for n/v and diarrhea found to have gastroenteritis. He is s/p TAVR on 09/04/17. PMH includes chronic A-fib, CAD, MI, CVA, CHF, aortic stenosis, arthritis, and HTN.  Clinical Impression  Pt presents with overall decrease in functional mobility secondary to above including impairments listed below (see PT Problem List). Pt demonstrated bed mobility min guard, transfers min assist, and ambulation min guard with RW. VSS with HR 70-82 throughout, BP 112/52 in supine at start of session, and 128/77 at end of session. Pt able to carry conversation during ambulation without dyspnea. Pt to benefit from continued acute PT to maximize safety, functional mobility, and independence prior to d/c home with HHPT.    Follow Up Recommendations Home health PT    Equipment Recommendations  None recommended by PT    Recommendations for Other Services       Precautions / Restrictions Precautions Precautions: Fall Restrictions Weight Bearing Restrictions: No      Mobility  Bed Mobility Overal bed mobility: Needs Assistance Bed Mobility: Supine to Sit     Supine to sit: HOB elevated;Min guard     General bed mobility comments: min guard with HOB elevated to 30 deg and use of bed rail to elevate trunk, VCs for sequencing, increased time required, able to scoot without physical assist  Transfers Overall transfer level: Needs assistance Equipment used: Rolling walker (2 wheeled);4-wheeled walker Transfers: Sit to/from Stand Sit to Stand: Min assist         General transfer comment: min assist to stabilize RW and for small boost up with rocking for momentum and pt blocking posterior LEs on bed. VCs for hand placement.   Ambulation/Gait Ambulation/Gait assistance: Min guard;Supervision Ambulation Distance (Feet): 300  Feet Assistive device: Rolling walker (2 wheeled) Gait Pattern/deviations: Step-through pattern;Decreased stride length;Drifts right/left;Trunk flexed Gait velocity: decreased Gait velocity interpretation: <1.8 ft/sec, indicative of risk for recurrent falls General Gait Details: pt with mild instability and kyphotic posture. good management of RW with minimal VCs for navigation. no LOB. no dyspnea.  Stairs            Wheelchair Mobility    Modified Rankin (Stroke Patients Only)       Balance Overall balance assessment: Needs assistance Sitting-balance support: No upper extremity supported;Feet supported Sitting balance-Leahy Scale: Good     Standing balance support: No upper extremity supported Standing balance-Leahy Scale: Fair Standing balance comment: pt able to stand with no UE support to use urinal. requires bil UE support on RW for ambulation                             Pertinent Vitals/Pain Pain Assessment: No/denies pain    Home Living Family/patient expects to be discharged to:: Private residence Living Arrangements: Spouse/significant other Available Help at Discharge: Family;Available 24 hours/day Type of Home: House Home Access: Stairs to enter Entrance Stairs-Rails: Left Entrance Stairs-Number of Steps: 2 Home Layout: Multi-level;Bed/bath upstairs Home Equipment: Walker - 2 wheels;Walker - 4 wheels;Shower seat;Cane - single point;Tub bench(cane is tripod cane)      Prior Function Level of Independence: Independent with assistive device(s)               Hand Dominance   Dominant Hand: Right    Extremity/Trunk Assessment   Upper Extremity Assessment Upper Extremity Assessment: Generalized weakness  Lower Extremity Assessment Lower Extremity Assessment: Generalized weakness    Cervical / Trunk Assessment Cervical / Trunk Assessment: Kyphotic  Communication   Communication: HOH  Cognition Arousal/Alertness:  Awake/alert Behavior During Therapy: WFL for tasks assessed/performed Overall Cognitive Status: Within Functional Limits for tasks assessed                                        General Comments General comments (skin integrity, edema, etc.): VSS. Pt's wife present    Exercises     Assessment/Plan    PT Assessment Patient needs continued PT services  PT Problem List Decreased strength;Decreased activity tolerance;Decreased balance;Decreased mobility       PT Treatment Interventions DME instruction;Gait training;Stair training;Functional mobility training;Therapeutic activities;Therapeutic exercise;Balance training;Neuromuscular re-education;Patient/family education    PT Goals (Current goals can be found in the Care Plan section)  Acute Rehab PT Goals Patient Stated Goal: to go home PT Goal Formulation: With patient/family Time For Goal Achievement: 09/26/17 Potential to Achieve Goals: Good    Frequency Min 3X/week   Barriers to discharge Inaccessible home environment multiple level home with only bathrooms and bedroom up 8 steps    Co-evaluation               AM-PAC PT "6 Clicks" Daily Activity  Outcome Measure Difficulty turning over in bed (including adjusting bedclothes, sheets and blankets)?: A Little Difficulty moving from lying on back to sitting on the side of the bed? : A Little Difficulty sitting down on and standing up from a chair with arms (e.g., wheelchair, bedside commode, etc,.)?: A Little Help needed moving to and from a bed to chair (including a wheelchair)?: A Little Help needed walking in hospital room?: A Little Help needed climbing 3-5 steps with a railing? : A Little 6 Click Score: 18    End of Session Equipment Utilized During Treatment: Gait belt Activity Tolerance: Patient tolerated treatment well Patient left: in chair;with call bell/phone within reach;with chair alarm set;with family/visitor present Nurse  Communication: Mobility status PT Visit Diagnosis: Unsteadiness on feet (R26.81);Other abnormalities of gait and mobility (R26.89)    Time: 1428-1458(1346-1352 additionally(had to come back to complete session)) PT Time Calculation (min) (ACUTE ONLY): 30 min   Charges:   PT Evaluation $PT Eval Low Complexity: 1 Low PT Treatments $Gait Training: 8-22 mins   PT G Codes:        Vic Ripper, SPT  Vic Ripper 09/12/2017, 4:14 PM

## 2017-09-12 NOTE — Telephone Encounter (Signed)
Attempted to call patient to see if interested in CR - lm on vm °

## 2017-09-12 NOTE — Progress Notes (Signed)
pts BP 94/49 when RN took pts BP manually BP  was  98/46 MD notified will continue to monitor

## 2017-09-12 NOTE — Progress Notes (Signed)
ANTICOAGULATION CONSULT NOTE - Follow-Up Consult  Pharmacy Consult:  Coumadin Indication:  Afib (s/p TAVR 3/19)   Allergies  Allergen Reactions  . Penicillins Rash and Other (See Comments)    PATIENT HAS HAD A PCN REACTION WITH IMMEDIATE RASH, FACIAL/TONGUE/THROAT SWELLING, SOB, OR LIGHTHEADEDNESS WITH HYPOTENSION:  #  #  #  YES  #  #  #   Has patient had a PCN reaction causing severe rash involving mucus membranes or skin necrosis: No Has patient had a PCN reaction that required hospitalization No Has patient had a PCN reaction occurring within the last 10 years: No If all of the above answers are "NO", then may proceed with Cephalosporin use.  . Prednisone Other (See Comments)    Wheezing  . Procaine Hcl Other (See Comments)    Passed out after being given this at dental appointment  . Levofloxacin Rash  . Oxycodone Other (See Comments)    constipation    Patient Measurements: Height: 5\' 11"  (180.3 cm) Weight: 161 lb 6 oz (73.2 kg) IBW/kg (Calculated) : 75.3  Vital Signs: Temp: 98.1 F (36.7 C) (03/27 0537) Temp Source: Oral (03/27 0537) BP: 111/63 (03/27 0926) Pulse Rate: 84 (03/27 0926)  Labs: Recent Labs    09/10/17 1150 09/11/17 0527 09/12/17 0515 09/12/17 0941  HGB 11.8* 10.5*  --  10.9*  HCT 37.0* 33.5*  --  35.2*  PLT 198 162  --  155  LABPROT 19.5* 18.2* 20.5*  --   INR 1.66 1.52 1.77  --   CREATININE 0.79 0.65  --  0.62    Estimated Creatinine Clearance: 68.6 mL/min (by C-G formula based on SCr of 0.62 mg/dL).   Medical History: Past Medical History:  Diagnosis Date  . Aortic stenosis   . Arthritis   . Atrial fibrillation (HCC)    Chronic  . Atrial fibrillation, chronic ([place])   . BPH (benign prostatic hyperplasia)   . CAD (coronary artery disease)   . Chronic anticoagulation   . History of chicken pox   . HTN (hypertension)   . Mitral stenosis   . Old MI (myocardial infarction) 2007   s/p PCI to distal OM (no stent)  . Severe mitral  regurgitation   . Stroke, embolic [facility])    4010-2 weeks after his heart attack      Assessment: 84 yo M with history of Afib and recent TAVR presented with nausea and vomiting.  Pharmacy has been consulted to continue Coumadin from PTA.  INR currently sub-therapeutic; no bleeding reported.  Home regimen:  Coumadin 5mg  PO daily except 2.5mg  on Tues and Sat   Goal of Therapy:  INR 2-3 Monitor platelets by anticoagulation protocol: Yes    Plan:  Coumadin 5mg  PO today Daily PT / INR  [employer], Pharm.D., BCPS Clinical Pharmacist Pager: [phone] Clinical phone for 09/12/2017 from 8:30-4:00 is [phone]. After 4pm, please call Main Rx [phone]) for assistance. 09/12/2017 1:11 PM

## 2017-09-12 NOTE — Progress Notes (Signed)
TAVR Team Note: Pt seen last night. Chart reviewed - current admission for management of gastroenteritis. BUN/Creatinine stable. Cardiac status stable after recent TAVR. Exam without any evidence of volume overload. MR murmur unchanged. Patient and wife were counseled. No acute cardiac issues.  He has a TOC visit scheduled 09/17/17 with Almyra Deforest and a 30 day TAVR visit scheduled with me 10/24/17. Please let us know if we can help in his care.  Sherren Mocha 09/12/2017 9:50 PM

## 2017-09-12 NOTE — Progress Notes (Signed)
ANTIBIOTIC CONSULT NOTE - INITIAL  Pharmacy Consult for Ceftriaxone Indication: UTI  Allergies  Allergen Reactions  . Penicillins Rash and Other (See Comments)    PATIENT HAS HAD A PCN REACTION WITH IMMEDIATE RASH, FACIAL/TONGUE/THROAT SWELLING, SOB, OR LIGHTHEADEDNESS WITH HYPOTENSION:  #  #  #  YES  #  #  #   Has patient had a PCN reaction causing severe rash involving mucus membranes or skin necrosis: No Has patient had a PCN reaction that required hospitalization No Has patient had a PCN reaction occurring within the last 10 years: No If all of the above answers are "NO", then may proceed with Cephalosporin use.  . Prednisone Other (See Comments)    Wheezing  . Procaine Hcl Other (See Comments)    Passed out after being given this at dental appointment  . Levofloxacin Rash  . Oxycodone Other (See Comments)    constipation    Patient Measurements: Height: 5\' 11"  (180.3 cm) Weight: 161 lb 6 oz (73.2 kg) IBW/kg (Calculated) : 75.3 Adjusted Body Weight: 73.2  Vital Signs: Temp: 98.5 F (36.9 C) (03/27 1439) Temp Source: Oral (03/27 1439) BP: 112/52 (03/27 1439) Pulse Rate: 71 (03/27 1439) Intake/Output from previous day: 03/26 0701 - 03/27 0700 In: -  Out: 1050 [Urine:1050] Intake/Output from this shift: Total I/O In: 360 [P.O.:360] Out: 300 [Urine:300]  Labs: Recent Labs    09/10/17 1150 09/11/17 0527 09/12/17 0941  WBC 8.6 5.6 9.0  HGB 11.8* 10.5* 10.9*  PLT 198 162 155  CREATININE 0.79 0.65 0.62   Estimated Creatinine Clearance: 68.6 mL/min (by C-G formula based on SCr of 0.62 mg/dL). No results for input(s): VANCOTROUGH, VANCOPEAK, VANCORANDOM, GENTTROUGH, GENTPEAK, GENTRANDOM, TOBRATROUGH, TOBRAPEAK, TOBRARND, AMIKACINPEAK, AMIKACINTROU, AMIKACIN in the last 72 hours.   Microbiology: Recent Results (from the past 720 hour(s))  Surgical pcr screen     Status: None   Collection Time: 08/31/17  3:11 PM  Result Value Ref Range Status   MRSA, PCR NEGATIVE  NEGATIVE Final   Staphylococcus aureus NEGATIVE NEGATIVE Final    Comment: (NOTE) The Xpert SA Assay (FDA approved for NASAL specimens in patients 25 years of age and older), is one component of a comprehensive surveillance program. It is not intended to diagnose infection nor to guide or monitor treatment. Performed at [facility], [place] [address] [place], [place] [address]     Medical History: Past Medical History:  Diagnosis Date  . Aortic stenosis   . Arthritis   . Atrial fibrillation (HCC)    Chronic  . Atrial fibrillation, chronic ([place])   . BPH (benign prostatic hyperplasia)   . CAD (coronary artery disease)   . Chronic anticoagulation   . History of chicken pox   . HTN (hypertension)   . Mitral stenosis   . Old MI (myocardial infarction) 2007   s/p PCI to distal OM (no stent)  . Severe mitral regurgitation   . 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 Patient recently tolerated cefazolin from 3/19-3/21. D/w attending physician, given previous tollerance of cephalosporins and low rate of cross reaction with 3rd generation cephalosporins, will change to ceftriaxone for UTI.   Plan:  D/C Bactrim Start ceftriaxone 1gm IV q24h  Evalynn Hankins A Vernella Niznik 09/12/2017,4:46 PM

## 2017-09-13 ENCOUNTER — Encounter (HOSPITAL_COMMUNITY): Payer: Self-pay

## 2017-09-13 DIAGNOSIS — Z8673 Personal history of transient ischemic attack (TIA), and cerebral infarction without residual deficits: Secondary | ICD-10-CM | POA: Diagnosis not present

## 2017-09-13 DIAGNOSIS — Z952 Presence of prosthetic heart valve: Secondary | ICD-10-CM | POA: Diagnosis not present

## 2017-09-13 DIAGNOSIS — A080 Rotaviral enteritis: Secondary | ICD-10-CM | POA: Diagnosis not present

## 2017-09-13 DIAGNOSIS — I5032 Chronic diastolic (congestive) heart failure: Secondary | ICD-10-CM | POA: Diagnosis not present

## 2017-09-13 DIAGNOSIS — Z7901 Long term (current) use of anticoagulants: Secondary | ICD-10-CM | POA: Diagnosis not present

## 2017-09-13 DIAGNOSIS — N400 Enlarged prostate without lower urinary tract symptoms: Secondary | ICD-10-CM | POA: Diagnosis present

## 2017-09-13 DIAGNOSIS — E860 Dehydration: Secondary | ICD-10-CM | POA: Diagnosis not present

## 2017-09-13 DIAGNOSIS — I2510 Atherosclerotic heart disease of native coronary artery without angina pectoris: Secondary | ICD-10-CM | POA: Diagnosis not present

## 2017-09-13 DIAGNOSIS — Z823 Family history of stroke: Secondary | ICD-10-CM | POA: Diagnosis not present

## 2017-09-13 DIAGNOSIS — I252 Old myocardial infarction: Secondary | ICD-10-CM | POA: Diagnosis not present

## 2017-09-13 DIAGNOSIS — R791 Abnormal coagulation profile: Secondary | ICD-10-CM | POA: Diagnosis present

## 2017-09-13 DIAGNOSIS — R531 Weakness: Secondary | ICD-10-CM | POA: Diagnosis not present

## 2017-09-13 DIAGNOSIS — Z7982 Long term (current) use of aspirin: Secondary | ICD-10-CM | POA: Diagnosis not present

## 2017-09-13 DIAGNOSIS — I482 Chronic atrial fibrillation: Secondary | ICD-10-CM | POA: Diagnosis not present

## 2017-09-13 DIAGNOSIS — Z9861 Coronary angioplasty status: Secondary | ICD-10-CM | POA: Diagnosis not present

## 2017-09-13 DIAGNOSIS — N390 Urinary tract infection, site not specified: Secondary | ICD-10-CM | POA: Diagnosis not present

## 2017-09-13 DIAGNOSIS — K529 Noninfective gastroenteritis and colitis, unspecified: Secondary | ICD-10-CM | POA: Diagnosis not present

## 2017-09-13 DIAGNOSIS — I959 Hypotension, unspecified: Secondary | ICD-10-CM | POA: Diagnosis present

## 2017-09-13 DIAGNOSIS — R112 Nausea with vomiting, unspecified: Secondary | ICD-10-CM | POA: Diagnosis present

## 2017-09-13 DIAGNOSIS — I110 Hypertensive heart disease with heart failure: Secondary | ICD-10-CM | POA: Diagnosis present

## 2017-09-13 DIAGNOSIS — Z8249 Family history of ischemic heart disease and other diseases of the circulatory system: Secondary | ICD-10-CM | POA: Diagnosis not present

## 2017-09-13 LAB — URINE CULTURE: Culture: NO GROWTH

## 2017-09-13 LAB — GASTROINTESTINAL PANEL BY PCR, STOOL (REPLACES STOOL CULTURE)
ADENOVIRUS F40/41: NOT DETECTED
ASTROVIRUS: NOT DETECTED
CYCLOSPORA CAYETANENSIS: NOT DETECTED
Campylobacter species: NOT DETECTED
Cryptosporidium: NOT DETECTED
ENTAMOEBA HISTOLYTICA: NOT DETECTED
ENTEROAGGREGATIVE E COLI (EAEC): NOT DETECTED
ENTEROPATHOGENIC E COLI (EPEC): NOT DETECTED
ENTEROTOXIGENIC E COLI (ETEC): NOT DETECTED
GIARDIA LAMBLIA: NOT DETECTED
NOROVIRUS GI/GII: DETECTED — AB
Plesimonas shigelloides: NOT DETECTED
Rotavirus A: NOT DETECTED
SHIGA LIKE TOXIN PRODUCING E COLI (STEC): NOT DETECTED
Salmonella species: NOT DETECTED
Sapovirus (I, II, IV, and V): NOT DETECTED
Shigella/Enteroinvasive E coli (EIEC): NOT DETECTED
VIBRIO CHOLERAE: NOT DETECTED
VIBRIO SPECIES: NOT DETECTED
Yersinia enterocolitica: NOT DETECTED

## 2017-09-13 LAB — BASIC METABOLIC PANEL
ANION GAP: 10 (ref 5–15)
BUN: 10 mg/dL (ref 6–20)
CHLORIDE: 100 mmol/L — AB (ref 101–111)
CO2: 23 mmol/L (ref 22–32)
CREATININE: 0.66 mg/dL (ref 0.61–1.24)
Calcium: 7.9 mg/dL — ABNORMAL LOW (ref 8.9–10.3)
GFR calc non Af Amer: >60 mL/min (ref >60)
Glucose, Bld: 82 mg/dL (ref 65–99)
Potassium: 4.2 mmol/L (ref 3.5–5.1)
SODIUM: 133 mmol/L — AB (ref 135–145)

## 2017-09-13 LAB — CBC
HEMATOCRIT: 35 % — AB (ref 39.0–52.0)
HEMOGLOBIN: 11.1 g/dL — AB (ref 13.0–17.0)
MCH: 28.5 pg (ref 26.0–34.0)
MCHC: 31.7 g/dL (ref 30.0–36.0)
MCV: 90 fL (ref 78.0–100.0)
Platelets: UNDETERMINED 10*3/uL (ref 150–400)
RBC: 3.89 MIL/uL — ABNORMAL LOW (ref 4.22–5.81)
RDW: 16.8 % — ABNORMAL HIGH (ref 11.5–15.5)
WBC: 8.1 10*3/uL (ref 4.0–10.5)

## 2017-09-13 LAB — GLUCOSE, CAPILLARY: GLUCOSE-CAPILLARY: 79 mg/dL (ref 65–99)

## 2017-09-13 LAB — PROTIME-INR
INR: 2.83
PROTHROMBIN TIME: 29.5 s — AB (ref 11.4–15.2)

## 2017-09-13 MED ORDER — SACCHAROMYCES BOULARDII 250 MG PO CAPS
250.0000 mg | ORAL_CAPSULE | Freq: Two times a day (BID) | ORAL | Status: DC
  Administered 2017-09-13 – 2017-09-15 (×5): 250 mg via ORAL
  Filled 2017-09-13 (×4): qty 1

## 2017-09-13 NOTE — Progress Notes (Signed)
PROGRESS NOTE    Wayne [name]  PFX:[id number] DOB: October 17, 1929 DOA: 09/10/2017 PCP: [name], DO    Brief Narrative: This is a 84 year old gentleman status post TVR 6 days ago. He has been doing well but last night at midnight he developed nausea and vomiting. He had approximately 8 emesis. His last emesis was at 6 AM this morning. At approximately 7 AM he had a large incontinent BM, he has not had a recurrence since. Post BM he was weak, somewhat disoriented and dehydrated appearing. His wife has not given him his blood pressure medications today because of his GI symptoms. She spoke with her PCP who directed them to the ER. Here in the ER the patient appears weak and a bit dehydrated. Cardiology service was contacted and they recommended overnight observation given his recent surgery and gastroenteritis. They're concerned for dehydration. They want to see the patient in the a.m. History provided by the patient as well as his wife is present at bedside.    Assessment & Plan:   Principal Problem:   Gastroenteritis Active Problems:   Chronic atrial fibrillation (HCC)   h/o CVA (cerebrovascular accident due to intracerebral hemorrhage)   Chronic anticoagulation   HTN (hypertension)   CAD (coronary artery disease)   Chronic diastolic CHF (congestive heart failure) (HCC)   S/P TAVR (transcatheter aortic valve replacement)   1-Gastroenteritis; Rotavirus.  Continue with IV fluids. Increase rate. Started to have diarrhea again.  Now on dysphagia diet.  GI pathogen positive rotavirus.  Not eating, poor oral intake. Increase IV fluids.   2-S/P recent TAVR;  Cardiology informed of admission.  Continue with coumadin. Stable.   3-Nausea, supra-pubic pain, dysuria. UTI KUB negative.  IV fluids. IV protonix.  LFT on admission normal.  UA with 6-30 WBC, urine culture no gorwth On IV ceftriaxone. Day 2/3/   Hypotension; IV fluids.   CAD;  On aspirin, metoprolol.    Chronic A fib;  On digoxin, metoprolol. Coumadin.   H/O CVA;  Coumadin.   HTN;  Metoprolol.    DVT prophylaxis: coumadin  Code Status:  Full code.  Family Communication: care discussed with patient.  Disposition Plan: home when he tolerates diet   Consultants:   Cardiology  CVTS   Procedures: none   Antimicrobials:    Subjective: He has not been eating, poor oral  intake. Had multiples BM overnight. Nausea some what improved. Dysuria improved    Objective: Vitals:   09/13/17 0902 09/13/17 1100 09/13/17 1300 09/13/17 1401  BP: 117/69 125/60 116/72 116/70  Pulse: 78 77 85 86  Resp: 19 17 16    Temp: 98 F (36.7 C) 98.1 F (36.7 C) 98 F (36.7 C) 98.3 F (36.8 C)  TempSrc: Oral Oral Oral Oral  SpO2: 98% 97% 98% 95%  Weight:      Height:        Intake/Output Summary (Last 24 hours) at 09/13/2017 1420 Last data filed at 09/13/2017 1227 Gross per 24 hour  Intake 585 ml  Output 1092 ml  Net -507 ml   Filed Weights   09/10/17 1436 09/11/17 0105  Weight: 77.1 kg (170 lb) 73.2 kg (161 lb 6 oz)    Examination:  General exam: NAD Respiratory system: CTA Cardiovascular system: S 1, S 2 RRR Gastrointestinal system: BS present, soft, nt Central nervous system: Alert, non focal.  Extremities: Symmetric power.  Skin: No rash     Data Reviewed: I have personally reviewed following labs and imaging studies  CBC:  Recent Labs  Lab 09/10/17 1150 09/11/17 0527 09/12/17 0941 09/13/17 0422  WBC 8.6 5.6 9.0 8.1  NEUTROABS 8.0*  --   --   --   HGB 11.8* 10.5* 10.9* 11.1*  HCT 37.0* 33.5* 35.2* 35.0*  MCV 88.9 90.3 90.5 90.0  PLT 198 162 155 PLATELET CLUMPS NOTED ON SMEAR, UNABLE TO ESTIMATE   Basic Metabolic Panel: Recent Labs  Lab 09/10/17 1150 09/11/17 0527 09/12/17 0941 09/13/17 0422  NA 138 136 135 133*  K 4.0 3.7 3.6 4.2  CL 103 102 100* 100*  CO2 24 26 23 23   GLUCOSE 125* 87 83 82  BUN 18 14 7 10   CREATININE 0.79 0.65 0.62 0.66   CALCIUM 8.6* 8.0* 7.9* 7.9*  MG  --   --  1.8  --    GFR: Estimated Creatinine Clearance: 68.6 mL/min (by C-G formula based on SCr of 0.66 mg/dL). Liver Function Tests: Recent Labs  Lab 09/10/17 1150  AST 28  ALT 13*  ALKPHOS 50  BILITOT 0.9  PROT 6.9  ALBUMIN 3.5   Recent Labs  Lab 09/12/17 0941  LIPASE 34   No results for input(s): AMMONIA in the last 168 hours. Coagulation Profile: Recent Labs  Lab 09/10/17 1150 09/11/17 0527 09/12/17 0515 09/13/17 0422  INR 1.66 1.52 1.77 2.83   Cardiac Enzymes: No results for input(s): CKTOTAL, CKMB, CKMBINDEX, TROPONINI in the last 168 hours. BNP (last 3 results) No results for input(s): PROBNP in the last 8760 hours. HbA1C: No results for input(s): HGBA1C in the last 72 hours. CBG: Recent Labs  Lab 09/12/17 0838 09/13/17 0845  GLUCAP 84 79   Lipid Profile: No results for input(s): CHOL, HDL, LDLCALC, TRIG, CHOLHDL, LDLDIRECT in the last 72 hours. Thyroid Function Tests: No results for input(s): TSH, T4TOTAL, FREET4, T3FREE, THYROIDAB in the last 72 hours. Anemia Panel: No results for input(s): VITAMINB12, FOLATE, FERRITIN, TIBC, IRON, RETICCTPCT in the last 72 hours. Sepsis Labs: Recent Labs  Lab 09/10/17 1201 09/10/17 1449  LATICACIDVEN 1.57 1.38    Recent Results (from the past 240 hour(s))  Urine Culture     Status: None   Collection Time: 09/12/17  3:14 PM  Result Value Ref Range Status   Specimen Description URINE, CLEAN CATCH  Final   Special Requests NONE  Final   Culture   Final    NO GROWTH Performed at [facility], [place] [address], [place], [place] [address]    Report Status 09/13/2017 FINAL  Final  Gastrointestinal Panel by PCR , Stool     Status: Abnormal   Collection Time: 09/13/17  2:02 AM  Result Value Ref Range Status   Campylobacter species NOT DETECTED NOT DETECTED Final   Plesimonas shigelloides NOT DETECTED NOT DETECTED Final   Salmonella species NOT DETECTED NOT DETECTED  Final   Yersinia enterocolitica NOT DETECTED NOT DETECTED Final   Vibrio species NOT DETECTED NOT DETECTED Final   Vibrio cholerae NOT DETECTED NOT DETECTED Final   Enteroaggregative E coli (EAEC) NOT DETECTED NOT DETECTED Final   Enteropathogenic E coli (EPEC) NOT DETECTED NOT DETECTED Final   Enterotoxigenic E coli (ETEC) NOT DETECTED NOT DETECTED Final   Shiga like toxin producing E coli (STEC) NOT DETECTED NOT DETECTED Final   Shigella/Enteroinvasive E coli (EIEC) NOT DETECTED NOT DETECTED Final   Cryptosporidium NOT DETECTED NOT DETECTED Final   Cyclospora cayetanensis NOT DETECTED NOT DETECTED Final   Entamoeba histolytica NOT DETECTED NOT DETECTED Final   Giardia lamblia  NOT DETECTED NOT DETECTED Final   Adenovirus F40/41 NOT DETECTED NOT DETECTED Final   Astrovirus NOT DETECTED NOT DETECTED Final   Norovirus GI/GII DETECTED (A) NOT DETECTED Final    Comment: RESULT CALLED TO, READ BACK BY AND VERIFIED WITH: SARAH JONES @1226  09/13/17 AKT    Rotavirus A NOT DETECTED NOT DETECTED Final   Sapovirus (I, II, IV, and V) NOT DETECTED NOT DETECTED Final    Comment: Performed at [facility], [address], [place], [place] [address]         Radiology Studies: Dg Abd 1 View  Result Date: 09/12/2017 CLINICAL DATA:  Low abdominal pain. Status post transfemoral aortic valve replacement March 2019. EXAM: ABDOMEN - 1 VIEW COMPARISON:  08/06/2017 FINDINGS: Normal bowel gas pattern. No stool retention. No concerning mass effect or calcification. Spinal degeneration and osteopenia. Atherosclerotic calcification. IMPRESSION: Normal bowel gas pattern. Electronically Signed   By: Monte Fantasia M.D.   On: 09/12/2017 10:51        Scheduled Meds: . alfuzosin  10 mg Oral Q breakfast  . aspirin  81 mg Oral Daily  . digoxin  125 mcg Oral Daily  . feeding supplement (ENSURE ENLIVE)  237 mL Oral BID BM  . finasteride  5 mg Oral QHS  . Melatonin  3 mg Oral QHS  . metoprolol  tartrate  12.5 mg Oral BID  . pantoprazole (PROTONIX) IV  40 mg Intravenous Q12H  . potassium chloride  10 mEq Oral Daily  . saccharomyces boulardii  250 mg Oral BID  . Warfarin - Pharmacist Dosing Inpatient   Does not apply q1800   Continuous Infusions: . sodium chloride 75 mL/hr at 09/13/17 0441  . cefTRIAXone (ROCEPHIN)  IV Stopped (09/12/17 1929)     LOS: 1 day    Time spent: 35 minutes.     Elmarie Shiley, MD [facility] Pager [phone]  If 7PM-7AM, please contact night-coverage [email or web address] Password [email or web address] 09/13/2017, 2:20 PM

## 2017-09-13 NOTE — Progress Notes (Signed)
CRITICAL VALUE ALERT  Critical Value:  GI Panel +Norovirus  Date & Time Notied:  09/13/17 @ 1225  Provider Notified: Dr Tyrell Antonio paged @ 1229  Orders Received/Actions taken: Will await orders and continue to monitor

## 2017-09-13 NOTE — Progress Notes (Signed)
ANTICOAGULATION CONSULT NOTE - Follow-Up Consult  Pharmacy Consult:  Coumadin Indication:  Afib (s/p TAVR 3/19)   Allergies  Allergen Reactions  . Penicillins Rash and Other (See Comments)    PATIENT HAS HAD A PCN REACTION WITH IMMEDIATE RASH, FACIAL/TONGUE/THROAT SWELLING, SOB, OR LIGHTHEADEDNESS WITH HYPOTENSION:  #  #  #  YES  #  #  #   Has patient had a PCN reaction causing severe rash involving mucus membranes or skin necrosis: No Has patient had a PCN reaction that required hospitalization No Has patient had a PCN reaction occurring within the last 10 years: No If all of the above answers are "NO", then may proceed with Cephalosporin use.  . Prednisone Other (See Comments)    Wheezing  . Procaine Hcl Other (See Comments)    Passed out after being given this at dental appointment  . Levofloxacin Rash  . Oxycodone Other (See Comments)    constipation    Patient Measurements: Height: 5\' 11"  (180.3 cm) Weight: 161 lb 6 oz (73.2 kg) IBW/kg (Calculated) : 75.3  Vital Signs: Temp: 98 F (36.7 C) (03/28 0902) Temp Source: Oral (03/28 0902) BP: 125/60 (03/28 1100) Pulse Rate: 78 (03/28 0902)  Labs: Recent Labs    09/11/17 0527 09/12/17 0515 09/12/17 0941 09/13/17 0422  HGB 10.5*  --  10.9* 11.1*  HCT 33.5*  --  35.2* 35.0*  PLT 162  --  155 PLATELET CLUMPS NOTED ON SMEAR, UNABLE TO ESTIMATE  LABPROT 18.2* 20.5*  --  29.5*  INR 1.52 1.77  --  2.83  CREATININE 0.65  --  0.62 0.66    Estimated Creatinine Clearance: 68.6 mL/min (by C-G formula based on SCr of 0.66 mg/dL).   Medical History: Past Medical History:  Diagnosis Date  . Aortic stenosis   . Arthritis   . Atrial fibrillation (HCC)    Chronic  . Atrial fibrillation, chronic ([place])   . BPH (benign prostatic hyperplasia)   . CAD (coronary artery disease)   . Chronic anticoagulation   . History of chicken pox   . HTN (hypertension)   . Mitral stenosis   . Old MI (myocardial infarction) 2007   s/p PCI to  distal OM (no stent)  . Severe mitral regurgitation   . Stroke, embolic [facility])    9407-6 weeks after his heart attack      Assessment: 85 yo M with history of Afib and recent TAVR presented with nausea and vomiting.  Pharmacy has been consulted to continue Coumadin from PTA.  INR with significant jump in INR; no bleeding reported. Patient also started on abx for UTI per MD which may increase INR.   Home regimen:  Coumadin 5mg  PO daily except 2.5mg  on Tues and Sat   Goal of Therapy:  INR 2-3 Monitor platelets by anticoagulation protocol: Yes    Plan:  Hold Coumadin today Daily PT / INR  Heleena Miceli Levada Dy, Pharm.D., BCPS Clinical Pharmacist Pager: [phone] Clinical phone for 09/13/2017 from 8:30-4:00 is [phone]. After 4pm, please call Main Rx [phone]) for assistance. 09/13/2017 11:01 AM

## 2017-09-13 NOTE — Progress Notes (Signed)
Physical Therapy Treatment Patient Details Name: Wayne [name] MRN: [id number] DOB: February 12, 1930 Today's Date: 09/13/2017    History of Present Illness 84 y.o. male admitted for n/v and diarrhea found to have gastroenteritis. He is s/p TAVR on 09/04/17. PMH includes chronic A-fib, CAD, MI, CVA, CHF, aortic stenosis, arthritis, and HTN.    PT Comments    Pt is making good progress towards his goals, however continues to experience generalized weakness from mild dehydration and decreased food intake. Pt is minA for transfers to 4 wheeled walker and min guard for ambulation of 200 feet. PT continues to recommend HHPT at d/c to improve balance and endurance to safely navigate in his home environment. PT will continue to follow acutely.   Follow Up Recommendations  Home health PT     Equipment Recommendations  None recommended by PT    Recommendations for Other Services       Precautions / Restrictions Precautions Precautions: Fall Restrictions Weight Bearing Restrictions: No    Mobility  Bed Mobility               General bed mobility comments: OOB in recliner  Transfers Overall transfer level: Needs assistance Equipment used: 4-wheeled walker Transfers: Sit to/from Stand Sit to Stand: Min assist         General transfer comment: min assist to stabilize with 4 wheeled walker, no posterior LE blocking noticed today  Ambulation/Gait Ambulation/Gait assistance: Min guard;Supervision Ambulation Distance (Feet): 200 Feet Assistive device: Rolling walker (2 wheeled) Gait Pattern/deviations: Step-through pattern;Decreased stride length;Drifts right/left;Trunk flexed Gait velocity: decreased Gait velocity interpretation: Below normal speed for age/gender General Gait Details: min guard for mild instability, vc for upright posture and looking up and out, no LoB, or dyspnea with ambulation          Balance Overall balance assessment: Needs  assistance Sitting-balance support: No upper extremity supported;Feet supported Sitting balance-Leahy Scale: Good     Standing balance support: No upper extremity supported Standing balance-Leahy Scale: Fair Standing balance comment: Pt able to use urinal without assist or UE support                             Cognition Arousal/Alertness: Awake/alert Behavior During Therapy: WFL for tasks assessed/performed Overall Cognitive Status: Within Functional Limits for tasks assessed                                               Pertinent Vitals/Pain Pain Assessment: No/denies pain           PT Goals (current goals can now be found in the care plan section) Acute Rehab PT Goals Patient Stated Goal: to go home PT Goal Formulation: With patient/family Time For Goal Achievement: 09/26/17 Potential to Achieve Goals: Good Progress towards PT goals: Progressing toward goals    Frequency    Min 3X/week      PT Plan Current plan remains appropriate       AM-PAC PT "6 Clicks" Daily Activity  Outcome Measure  Difficulty turning over in bed (including adjusting bedclothes, sheets and blankets)?: A Little Difficulty moving from lying on back to sitting on the side of the bed? : A Little Difficulty sitting down on and standing up from a chair with arms (e.g., wheelchair, bedside commode, etc,.)?: A Little Help needed moving to and  from a bed to chair (including a wheelchair)?: A Little Help needed walking in hospital room?: A Little Help needed climbing 3-5 steps with a railing? : A Little 6 Click Score: 18    End of Session Equipment Utilized During Treatment: Gait belt Activity Tolerance: Patient tolerated treatment well Patient left: in chair;with call bell/phone within reach;with chair alarm set;with family/visitor present Nurse Communication: Mobility status PT Visit Diagnosis: Unsteadiness on feet (R26.81);Other abnormalities of gait and  mobility (R26.89)     Time: 9450-3888 PT Time Calculation (min) (ACUTE ONLY): 24 min  Charges:  $Gait Training: 8-22 mins $Therapeutic Activity: 8-22 mins                    G Codes:       Rynell Ciotti B. Migdalia Dk PT, DPT Acute Rehabilitation  [phone] Pager [phone]     [place] 09/13/2017, 5:15 PM

## 2017-09-14 LAB — BASIC METABOLIC PANEL
ANION GAP: 8 (ref 5–15)
BUN: 9 mg/dL (ref 6–20)
CALCIUM: 7.6 mg/dL — AB (ref 8.9–10.3)
CO2: 23 mmol/L (ref 22–32)
Chloride: 101 mmol/L (ref 101–111)
Creatinine, Ser: 0.69 mg/dL (ref 0.61–1.24)
GFR calc Af Amer: >60 mL/min (ref >60)
Glucose, Bld: 85 mg/dL (ref 65–99)
Potassium: 3.6 mmol/L (ref 3.5–5.1)
Sodium: 132 mmol/L — ABNORMAL LOW (ref 135–145)

## 2017-09-14 LAB — PROTIME-INR
INR: 3.71
PROTHROMBIN TIME: 36.5 s — AB (ref 11.4–15.2)

## 2017-09-14 MED ORDER — SODIUM CHLORIDE 0.9 % IV SOLN
1.0000 g | Freq: Once | INTRAVENOUS | Status: AC
  Administered 2017-09-14: 1 g via INTRAVENOUS
  Filled 2017-09-14: qty 10

## 2017-09-14 MED ORDER — GUAIFENESIN ER 600 MG PO TB12
600.0000 mg | ORAL_TABLET | Freq: Two times a day (BID) | ORAL | Status: DC | PRN
  Administered 2017-09-14: 600 mg via ORAL
  Filled 2017-09-14: qty 1

## 2017-09-14 NOTE — Progress Notes (Signed)
PROGRESS NOTE    Wayne [name]  VOH:[id number] DOB: 05-Apr-1930 DOA: 09/10/2017 PCP: [name], DO    Brief Narrative: This is a 84 year old gentleman status post TVR 6 days ago. He has been doing well but last night at midnight he developed nausea and vomiting. He had approximately 8 emesis. His last emesis was at 6 AM this morning. At approximately 7 AM he had a large incontinent BM, he has not had a recurrence since. Post BM he was weak, somewhat disoriented and dehydrated appearing. His wife has not given him his blood pressure medications today because of his GI symptoms. She spoke with her PCP who directed them to the ER. Here in the ER the patient appears weak and a bit dehydrated. Cardiology service was contacted and they recommended overnight observation given his recent surgery and gastroenteritis. They're concerned for dehydration. They want to see the patient in the a.m. History provided by the patient as well as his wife is present at bedside.    Assessment & Plan:   Principal Problem:   Gastroenteritis Active Problems:   Chronic atrial fibrillation (HCC)   h/o CVA (cerebrovascular accident due to intracerebral hemorrhage)   Chronic anticoagulation   HTN (hypertension)   CAD (coronary artery disease)   Chronic diastolic CHF (congestive heart failure) (HCC)   S/P TAVR (transcatheter aortic valve replacement)   1-Gastroenteritis; Rotavirus.  Continue with IV fluids. Increase rate. Started to have diarrhea again.  Now on dysphagia diet.  GI pathogen positive rotavirus.  Not eating, poor oral intake. IV fluids.   2-S/P recent TAVR;  Cardiology informed of admission.  Continue with coumadin. Stable.   3-Nausea, supra-pubic pain, dysuria. UTI KUB negative.  IV fluids. IV protonix.  LFT on admission normal.  UA with 6-30 WBC, urine culture no gorwth On IV ceftriaxone. Day 3/3  Hopefully stopping antibiotics will help diarrhea.   Hypotension; IV fluids.     CAD;  On aspirin, metoprolol.   Chronic A fib;  On digoxin, metoprolol. Coumadin.   H/O CVA;  Coumadin.   HTN;  Metoprolol.    DVT prophylaxis: coumadin  Code Status:  Full code.  Family Communication: care discussed with patient.  Disposition Plan: home when he tolerates diet   Consultants:   Cardiology  CVTS   Procedures: none   Antimicrobials:    Subjective: Still having diarrhea. Not eating enough    Objective: Vitals:   09/13/17 2042 09/14/17 0544 09/14/17 0936 09/14/17 1418  BP: 123/64 (!) 113/54  111/74  Pulse: 75 78 84 91  Resp: 16 17  18   Temp: 98 F (36.7 C) 97.8 F (36.6 C)  97.7 F (36.5 C)  TempSrc: Oral     SpO2: 94% 93%  96%  Weight:      Height:        Intake/Output Summary (Last 24 hours) at 09/14/2017 1617 Last data filed at 09/14/2017 1500 Gross per 24 hour  Intake 2717 ml  Output 1825 ml  Net 892 ml   Filed Weights   09/10/17 1436 09/11/17 0105  Weight: 77.1 kg (170 lb) 73.2 kg (161 lb 6 oz)    Examination:  General exam: NAD Respiratory system: CTA Cardiovascular system: S 1, S 2 RRR Gastrointestinal system: BS present, soft, nt Central nervous system: alert non focal.  Extremities: Symmetric power Skin: No rash     Data Reviewed: I have personally reviewed following labs and imaging studies  CBC: Recent Labs  Lab 09/10/17 1150 09/11/17 0527 09/12/17  1937 09/13/17 0422  WBC 8.6 5.6 9.0 8.1  NEUTROABS 8.0*  --   --   --   HGB 11.8* 10.5* 10.9* 11.1*  HCT 37.0* 33.5* 35.2* 35.0*  MCV 88.9 90.3 90.5 90.0  PLT 198 162 155 PLATELET CLUMPS NOTED ON SMEAR, UNABLE TO ESTIMATE   Basic Metabolic Panel: Recent Labs  Lab 09/10/17 1150 09/11/17 0527 09/12/17 0941 09/13/17 0422 09/14/17 0538  NA 138 136 135 133* 132*  K 4.0 3.7 3.6 4.2 3.6  CL 103 102 100* 100* 101  CO2 24 26 23 23 23   GLUCOSE 125* 87 83 82 85  BUN 18 14 7 10 9   CREATININE 0.79 0.65 0.62 0.66 0.69  CALCIUM 8.6* 8.0* 7.9* 7.9* 7.6*  MG   --   --  1.8  --   --    GFR: Estimated Creatinine Clearance: 68.6 mL/min (by C-G formula based on SCr of 0.69 mg/dL). Liver Function Tests: Recent Labs  Lab 09/10/17 1150  AST 28  ALT 13*  ALKPHOS 50  BILITOT 0.9  PROT 6.9  ALBUMIN 3.5   Recent Labs  Lab 09/12/17 0941  LIPASE 34   No results for input(s): AMMONIA in the last 168 hours. Coagulation Profile: Recent Labs  Lab 09/10/17 1150 09/11/17 0527 09/12/17 0515 09/13/17 0422 09/14/17 0538  INR 1.66 1.52 1.77 2.83 3.71   Cardiac Enzymes: No results for input(s): CKTOTAL, CKMB, CKMBINDEX, TROPONINI in the last 168 hours. BNP (last 3 results) No results for input(s): PROBNP in the last 8760 hours. HbA1C: No results for input(s): HGBA1C in the last 72 hours. CBG: Recent Labs  Lab 09/12/17 0838 09/13/17 0845  GLUCAP 84 79   Lipid Profile: No results for input(s): CHOL, HDL, LDLCALC, TRIG, CHOLHDL, LDLDIRECT in the last 72 hours. Thyroid Function Tests: No results for input(s): TSH, T4TOTAL, FREET4, T3FREE, THYROIDAB in the last 72 hours. Anemia Panel: No results for input(s): VITAMINB12, FOLATE, FERRITIN, TIBC, IRON, RETICCTPCT in the last 72 hours. Sepsis Labs: Recent Labs  Lab 09/10/17 1201 09/10/17 1449  LATICACIDVEN 1.57 1.38    Recent Results (from the past 240 hour(s))  Urine Culture     Status: None   Collection Time: 09/12/17  3:14 PM  Result Value Ref Range Status   Specimen Description URINE, CLEAN CATCH  Final   Special Requests NONE  Final   Culture   Final    NO GROWTH Performed at [facility], [place] [address] [place], [place] [address]    Report Status 09/13/2017 FINAL  Final  Gastrointestinal Panel by PCR , Stool     Status: Abnormal   Collection Time: 09/13/17  2:02 AM  Result Value Ref Range Status   Campylobacter species NOT DETECTED NOT DETECTED Final   Plesimonas shigelloides NOT DETECTED NOT DETECTED Final   Salmonella species NOT DETECTED NOT DETECTED Final    Yersinia enterocolitica NOT DETECTED NOT DETECTED Final   Vibrio species NOT DETECTED NOT DETECTED Final   Vibrio cholerae NOT DETECTED NOT DETECTED Final   Enteroaggregative E coli (EAEC) NOT DETECTED NOT DETECTED Final   Enteropathogenic E coli (EPEC) NOT DETECTED NOT DETECTED Final   Enterotoxigenic E coli (ETEC) NOT DETECTED NOT DETECTED Final   Shiga like toxin producing E coli (STEC) NOT DETECTED NOT DETECTED Final   Shigella/Enteroinvasive E coli (EIEC) NOT DETECTED NOT DETECTED Final   Cryptosporidium NOT DETECTED NOT DETECTED Final   Cyclospora cayetanensis NOT DETECTED NOT DETECTED Final   Entamoeba histolytica NOT DETECTED  NOT DETECTED Final   Giardia lamblia NOT DETECTED NOT DETECTED Final   Adenovirus F40/41 NOT DETECTED NOT DETECTED Final   Astrovirus NOT DETECTED NOT DETECTED Final   Norovirus GI/GII DETECTED (A) NOT DETECTED Final    Comment: RESULT CALLED TO, READ BACK BY AND VERIFIED WITH: SARAH JONES @1226  09/13/17 AKT    Rotavirus A NOT DETECTED NOT DETECTED Final   Sapovirus (I, II, IV, and V) NOT DETECTED NOT DETECTED Final    Comment: Performed at [facility], [address], [place], [place] [address]         Radiology Studies: No results found.      Scheduled Meds: . alfuzosin  10 mg Oral Q breakfast  . aspirin  81 mg Oral Daily  . digoxin  125 mcg Oral Daily  . feeding supplement (ENSURE ENLIVE)  237 mL Oral BID BM  . finasteride  5 mg Oral QHS  . Melatonin  3 mg Oral QHS  . metoprolol tartrate  12.5 mg Oral BID  . pantoprazole (PROTONIX) IV  40 mg Intravenous Q12H  . potassium chloride  10 mEq Oral Daily  . saccharomyces boulardii  250 mg Oral BID  . Warfarin - Pharmacist Dosing Inpatient   Does not apply q1800   Continuous Infusions: . sodium chloride 75 mL/hr at 09/14/17 1335     LOS: 2 days    Time spent: 35 minutes.     Elmarie Shiley, MD [facility] Pager [phone]  If 7PM-7AM, please contact  night-coverage [email or web address] Password [facility] 09/14/2017, 4:17 PM

## 2017-09-14 NOTE — Progress Notes (Signed)
ANTICOAGULATION CONSULT NOTE - Follow-Up Consult  Pharmacy Consult:  Coumadin Indication:  Afib (s/p TAVR 3/19)   Allergies  Allergen Reactions  . Penicillins Rash and Other (See Comments)    PATIENT HAS HAD A PCN REACTION WITH IMMEDIATE RASH, FACIAL/TONGUE/THROAT SWELLING, SOB, OR LIGHTHEADEDNESS WITH HYPOTENSION:  #  #  #  YES  #  #  #   Has patient had a PCN reaction causing severe rash involving mucus membranes or skin necrosis: No Has patient had a PCN reaction that required hospitalization No Has patient had a PCN reaction occurring within the last 10 years: No If all of the above answers are "NO", then may proceed with Cephalosporin use.  . Prednisone Other (See Comments)    Wheezing  . Procaine Hcl Other (See Comments)    Passed out after being given this at dental appointment  . Levofloxacin Rash  . Oxycodone Other (See Comments)    constipation    Patient Measurements: Height: 5\' 11"  (180.3 cm) Weight: 161 lb 6 oz (73.2 kg) IBW/kg (Calculated) : 75.3  Vital Signs: Temp: 97.8 F (36.6 C) (03/29 0544) BP: 113/54 (03/29 0544) Pulse Rate: 84 (03/29 0936)  Labs: Recent Labs    09/12/17 0515 09/12/17 0941 09/13/17 0422 09/14/17 0538  HGB  --  10.9* 11.1*  --   HCT  --  35.2* 35.0*  --   PLT  --  155 PLATELET CLUMPS NOTED ON SMEAR, UNABLE TO ESTIMATE  --   LABPROT 20.5*  --  29.5* 36.5*  INR 1.77  --  2.83 3.71  CREATININE  --  0.62 0.66 0.69    Estimated Creatinine Clearance: 68.6 mL/min (by C-G formula based on SCr of 0.69 mg/dL).   Medical History: Past Medical History:  Diagnosis Date  . Aortic stenosis   . Arthritis   . Atrial fibrillation (HCC)    Chronic  . Atrial fibrillation, chronic ([place])   . BPH (benign prostatic hyperplasia)   . CAD (coronary artery disease)   . Chronic anticoagulation   . History of chicken pox   . HTN (hypertension)   . Mitral stenosis   . Old MI (myocardial infarction) 2007   s/p PCI to distal OM (no stent)  .  Severe mitral regurgitation   . Stroke, embolic [facility])    5638-7 weeks after his heart attack      Assessment: 84 yo M with history of Afib and recent TAVR presented with nausea and vomiting.  Pharmacy has been consulted to continue Coumadin from PTA.  INR with significant jump in INR after holding dose yesterday; no bleeding reported. Patient with poor po intake and also started on abx for UTI per MD which may increase INR.   Home regimen:  Coumadin 5mg  PO daily except 2.5mg  on Tues and Sat   Goal of Therapy:  INR 2-3 Monitor platelets by anticoagulation protocol: Yes    Plan:  Hold Coumadin again today Daily PT / INR  Shyheem Whitham Levada Dy, Pharm.D., BCPS Clinical Pharmacist Pager: [phone] Clinical phone for 09/14/2017 from 8:30-4:00 is [phone]. After 4pm, please call Main Rx [phone]) for assistance. 09/14/2017 11:01 AM

## 2017-09-14 NOTE — Plan of Care (Signed)
Patient able to cough and deep breath.  Patient able to use incentive spirometer via return demonstration to create maximum lung expansion. Problem: Clinical Measurements: Goal: Respiratory complications will improve Outcome: Progressing

## 2017-09-15 LAB — GLUCOSE, CAPILLARY: GLUCOSE-CAPILLARY: 78 mg/dL (ref 65–99)

## 2017-09-15 LAB — PROTIME-INR
INR: 3.09
PROTHROMBIN TIME: 31.7 s — AB (ref 11.4–15.2)

## 2017-09-15 MED ORDER — SACCHAROMYCES BOULARDII 250 MG PO CAPS: 250.0000 mg | ORAL_CAPSULE | Freq: Two times a day (BID) | ORAL | 0 refills | Status: DC

## 2017-09-15 MED ORDER — WARFARIN SODIUM 2.5 MG PO TABS: ORAL_TABLET | ORAL | 0 refills | Status: DC

## 2017-09-15 MED ORDER — ENSURE ENLIVE PO LIQD: 237.0000 mL | Freq: Two times a day (BID) | ORAL | 12 refills | Status: DC

## 2017-09-15 NOTE — Progress Notes (Signed)
ANTICOAGULATION CONSULT NOTE - Follow-Up Consult  Pharmacy Consult:  Coumadin Indication:  Afib (s/p TAVR 3/19)   Allergies  Allergen Reactions  . Penicillins Rash and Other (See Comments)    PATIENT HAS HAD A PCN REACTION WITH IMMEDIATE RASH, FACIAL/TONGUE/THROAT SWELLING, SOB, OR LIGHTHEADEDNESS WITH HYPOTENSION:  #  #  #  YES  #  #  #   Has patient had a PCN reaction causing severe rash involving mucus membranes or skin necrosis: No Has patient had a PCN reaction that required hospitalization No Has patient had a PCN reaction occurring within the last 10 years: No If all of the above answers are "NO", then may proceed with Cephalosporin use.  . Prednisone Other (See Comments)    Wheezing  . Procaine Hcl Other (See Comments)    Passed out after being given this at dental appointment  . Levofloxacin Rash  . Oxycodone Other (See Comments)    constipation    Patient Measurements: Height: 5\' 11"  (180.3 cm) Weight: 161 lb 6 oz (73.2 kg) IBW/kg (Calculated) : 75.3  Vital Signs: Temp: 97.7 F (36.5 C) (03/30 0631) Temp Source: Oral (03/30 0631) BP: 129/61 (03/30 0940) Pulse Rate: 79 (03/30 0940)  Labs: Recent Labs    09/13/17 0422 09/14/17 0538 09/15/17 0458  HGB 11.1*  --   --   HCT 35.0*  --   --   PLT PLATELET CLUMPS NOTED ON SMEAR, UNABLE TO ESTIMATE  --   --   LABPROT 29.5* 36.5* 31.7*  INR 2.83 3.71 3.09  CREATININE 0.66 0.69  --     Estimated Creatinine Clearance: 68.6 mL/min (by C-G formula based on SCr of 0.69 mg/dL).   Medical History: Past Medical History:  Diagnosis Date  . Aortic stenosis   . Arthritis   . Atrial fibrillation (HCC)    Chronic  . Atrial fibrillation, chronic ([place])   . BPH (benign prostatic hyperplasia)   . CAD (coronary artery disease)   . Chronic anticoagulation   . History of chicken pox   . HTN (hypertension)   . Mitral stenosis   . Old MI (myocardial infarction) 2007   s/p PCI to distal OM (no stent)  . Severe mitral  regurgitation   . Stroke, embolic [facility])    9163-8 weeks after his heart attack      Assessment: 84 yo M with history of Afib and recent TAVR presented with nausea and vomiting.  Pharmacy has been consulted to continue Coumadin from PTA.   Coumadin for afib, recent TAVR 3/19 - INR 1.66 > 1.52>1.77>2.83>3.71> 3.09 Dose held 3/29 and 3/28  Due to INR increased significantly on 3/28 and above goal on 3/29.  Patient with poor po intake and also started on abx for UTI per MD which may increase INR.  Dr. Tyrell Antonio called me asking for dose recommendationfor discharge today. Notes pt was here for gastroenteritis , was on CTX,  pt better now, eating better. No antibiotic on discharge.  No bleeding noted. INR down to 3.09 INR subtherapeutic on admit 3/25 at 1.66, on  *home dose: 5mg  daily except 2.5mg  on Tues/Sat, last dose 3/24  Goal of Therapy:  INR 2-3 Monitor platelets by anticoagulation protocol: Yes    Plan:  Recommended Warfarin 2.5mg  qTTSat  (todays dose will be 2.5mg ) and 5 mg all other days of the week.  (this is a reduced dose from PTA dose).  With INR check next week by Wed. 09/19/17.   Nicole Cella, RPh Clinical Pharmacist Pager:  [phone] 09/15/2017 11:48 AM

## 2017-09-15 NOTE — Care Management Note (Signed)
Case Management Note  Patient Details  Name: Macklin Jacquin MRN: [id number] Date of Birth: 1930/02/12  Subjective/Objective:                 Home health set up w [facility], active pta, servoces added, DME delivered to room. No other CM needs.   Action/Plan:   Expected Discharge Date:  09/15/17               Expected Discharge Plan:  [place]  In-House Referral:     Discharge planning Services  CM Consult  Post Acute Care Choice:  Home Health, Resumption of Svcs/PTA Provider(Pt active with [facility] for HHPT) Choice offered to:  Spouse, Adult Children  DME Arranged:  3-N-1 DME Agency:  [place]:  RN, PT, OT, Nurse's Aide [place] Agency:  [employer]  Status of Service:  Completed, signed off  If discussed at [place] of Stay Meetings, dates discussed:    Additional Comments:  Carles Collet, RN 09/15/2017, 1:12 PM

## 2017-09-15 NOTE — Care Management Important Message (Signed)
Important Message  Patient Details  Name: Wayne [name] MRN: [id number] Date of Birth: [birth year]   Medicare Important Message Given:  Yes    Carles Collet, RN 09/15/2017, 10:22 AM

## 2017-09-15 NOTE — Progress Notes (Signed)
[name] to be D/C'd Home per MD order.  Discussed with the patient and all questions fully answered.  VSS, Skin clean, dry and intact without evidence of skin break down, no evidence of skin tears noted. IV catheter discontinued intact. Site without signs and symptoms of complications. Dressing and pressure applied.  An After Visit Summary was printed and given to the patient. Patient received prescription.  D/c education completed with patient/family including follow up instructions, medication list, d/c activities limitations if indicated, with other d/c instructions as indicated by MD - patient able to verbalize understanding, all questions fully answered.   Patient instructed to return to ED, call 911, or call MD for any changes in condition.   Patient escorted via [place], and D/C home via private auto.  Holley Raring 09/15/2017 1:43 PM

## 2017-09-15 NOTE — Discharge Summary (Signed)
Physician Discharge Summary  Wayne [name] NTZ:[id number] DOB: 1930-04-15 DOA: 09/10/2017  PCP: [name], DO  Admit date: 09/10/2017 Discharge date: 09/15/2017  Admitted From: Home  Disposition:  Home   Recommendations for Outpatient Follow-up:  1. Follow up with PCP in 1-2 weeks 2. Please obtain BMP/CBC in one week 3. Follow up with cardiology post TAVR. 4. Needs INR check, adjust coumadin as needed.   Home Health: yes.   Discharge Condition: stable.  CODE STATUS: full code.  Diet recommendation: Heart Healthy   Brief/Interim Summary:  Brief Narrative: This is a31 year old gentleman status post TVR 6 daysago.He has been doing well but last night at midnight he developed nausea and vomiting. He had approximately 8 emesis. His last emesis was at 6 AM this morning. At approximately 7 AM he had a large incontinent BM,he has not had a recurrencesince. Post BMhe was weak, somewhatdisoriented and dehydrated appearing. His wife hasnot given himhis blood pressure medications today because of his GI symptoms. She spoke with her PCP who directed themto the ER.Here in theER thepatient appears weak anda bitdehydrated. Cardiology service was contacted and they recommended overnight observation given his recent surgery and gastroenteritis. They're concerned for dehydration. Theywant toseethe patient in the a.m. History provided by the patient as well as his wife is present at bedside.    Assessment & Plan:   Principal Problem:   Gastroenteritis Active Problems:   Chronic atrial fibrillation (HCC)   h/o CVA (cerebrovascular accident due to intracerebral hemorrhage)   Chronic anticoagulation   HTN (hypertension)   CAD (coronary artery disease)   Chronic diastolic CHF (congestive heart failure) (HCC)   S/P TAVR (transcatheter aortic valve replacement)   1-Gastroenteritis; Rotavirus.  Continue with IV fluids. Increase rate. Started to have diarrhea again.   Now on dysphagia diet.  GI pathogen positive rotavirus.  He is doing better. Ate good breakfast no diarrhea. Walk this morning.   2-S/P recent TAVR;  Cardiology informed of admission.  Continue with coumadin. Stable.   3-Nausea, supra-pubic pain, dysuria. UTI KUB negative.  IV fluids. IV protonix. Discontinue protonix at discharge.  LFT on admission normal.  UA with 6-30 WBC, urine culture no gorwth treated  ceftriaxone. Day 3/3  Hopefully stopping antibiotics will help diarrhea.   Hypotension; resolved.   CAD;  On aspirin, metoprolol.   Chronic A fib;  On digoxin, metoprolol. Coumadin.   H/O CVA;  Coumadin.  INR elevated at 3. We have been holding coumadin for last few days. Plan to resume coumadin at 2.5 mg T, THu, Sat. And 5 mg others day.  Need INR early next week.   HTN;  Metoprolol.     Discharge Diagnoses:  Principal Problem:   Gastroenteritis Active Problems:   Chronic atrial fibrillation (HCC)   h/o CVA (cerebrovascular accident due to intracerebral hemorrhage)   Chronic anticoagulation   HTN (hypertension)   CAD (coronary artery disease)   Chronic diastolic CHF (congestive heart failure) (HCC)   S/P TAVR (transcatheter aortic valve replacement)    Discharge Instructions  Discharge Instructions    Diet - low sodium heart healthy   Complete by:  As directed    Increase activity slowly   Complete by:  As directed      Allergies as of 09/15/2017      Reactions   Penicillins Rash, Other (See Comments)   PATIENT HAS HAD A PCN REACTION WITH IMMEDIATE RASH, FACIAL/TONGUE/THROAT SWELLING, SOB, OR LIGHTHEADEDNESS WITH HYPOTENSION:  #  #  #  YES  #  #  #  Has patient had a PCN reaction causing severe rash involving mucus membranes or skin necrosis: No Has patient had a PCN reaction that required hospitalization No Has patient had a PCN reaction occurring within the last 10 years: No If all of the above answers are "NO", then may proceed with  Cephalosporin use.   Prednisone Other (See Comments)   Wheezing   Procaine Hcl Other (See Comments)   Passed out after being given this at dental appointment   Levofloxacin Rash   Oxycodone Other (See Comments)   constipation      Medication List    STOP taking these medications   polyethylene glycol packet Commonly known as:  MIRALAX / GLYCOLAX     TAKE these medications   acetaminophen 500 MG tablet Commonly known as:  TYLENOL Take 1,000 mg by mouth every 6 (six) hours as needed for moderate pain or headache.   alfuzosin 10 MG 24 hr tablet Commonly known as:  UROXATRAL Take 10 mg by mouth daily with breakfast.   ARTIFICIAL TEAR OP Place 1 drop into both eyes daily as needed (dry eyes).   aspirin 81 MG EC tablet Take 1 tablet (81 mg total) by mouth daily.   BIOFREEZE EX Apply 1 application topically 3 (three) times daily as needed (muscle pain in back). alterrnates between [employer] and [employer]   BLUE-EMU MAXIMUM STRENGTH EX Apply 1 application topically 3 (three) times daily as needed (muscle pain in back). alterrnates between [employer] and [employer]   calcium carbonate 500 MG chewable tablet Commonly known as:  TUMS - dosed in mg elemental calcium Chew 1-2 tablets by mouth daily as needed for indigestion or heartburn.   Cholecalciferol 2000 units Tabs Take 1 tablet (2,000 Units total) by mouth daily.   digoxin 0.125 MG tablet Commonly known as:  LANOXIN TAKE ONE TABLET BY MOUTH ONCE DAILY What changed:    how much to take  how to take this  when to take this   feeding supplement (ENSURE ENLIVE) Liqd Take 237 mLs by mouth 2 (two) times daily between meals.   finasteride 5 MG tablet Commonly known as:  PROSCAR Take 5 mg by mouth at bedtime.   furosemide 40 MG tablet Commonly known as:  LASIX Take 1 tablet (40 mg total) by mouth daily.   Melatonin 1 MG Tabs Take 2 mg by mouth at bedtime.   methocarbamol 500 MG tablet Commonly known as:   ROBAXIN Take 500 mg by mouth daily as needed (cramps).   metoprolol tartrate 25 MG tablet Commonly known as:  LOPRESSOR Take 0.5 tablets (12.5 mg total) by mouth 2 (two) times daily.   MUCINEX DM 30-600 MG Tb12 Take 1-2 tablets by mouth every 12 (twelve) hours as needed (for cough/congestion.).   potassium chloride 10 MEQ tablet Commonly known as:  K-DUR,KLOR-CON Take 1 tablet (10 mEq total) by mouth daily.   saccharomyces boulardii 250 MG capsule Commonly known as:  FLORASTOR Take 1 capsule (250 mg total) by mouth 2 (two) times daily.   sodium chloride 0.65 % Soln nasal spray Commonly known as:  OCEAN Place 1 spray into both nostrils daily as needed for congestion.   triamcinolone cream 0.1 % Commonly known as:  KENALOG Apply 1 application topically daily as needed (dry skin).   warfarin 2.5 MG tablet Commonly known as:  COUMADIN Take as directed. If you are unsure how to take this medication, talk to your nurse or doctor. Original instructions:  Take 2.5  mg on Saturday, Tuesday and Thursday.  Take 5 mg on Sunday, Monday and Wednesday. What changed:  additional instructions       Allergies  Allergen Reactions  . Penicillins Rash and Other (See Comments)    PATIENT HAS HAD A PCN REACTION WITH IMMEDIATE RASH, FACIAL/TONGUE/THROAT SWELLING, SOB, OR LIGHTHEADEDNESS WITH HYPOTENSION:  #  #  #  YES  #  #  #   Has patient had a PCN reaction causing severe rash involving mucus membranes or skin necrosis: No Has patient had a PCN reaction that required hospitalization No Has patient had a PCN reaction occurring within the last 10 years: No If all of the above answers are "NO", then may proceed with Cephalosporin use.  . Prednisone Other (See Comments)    Wheezing  . Procaine Hcl Other (See Comments)    Passed out after being given this at dental appointment  . Levofloxacin Rash  . Oxycodone Other (See Comments)    constipation    Consultations: Dr  Burt Knack  Procedures/Studies: Dg Chest 2 View  Result Date: 09/10/2017 CLINICAL DATA:  Pt had heart valve replaced on 3/19 and went home from the hospital on Thursday 3/21. Pt was doing well until last night he began to having vomiting x7 and diarrhea x1 that started this morning. Pt appears to be weak and pale. EXAM: CHEST - 2 VIEW COMPARISON:  09/04/2017 FINDINGS: Cardiac silhouette is mildly enlarged. Prosthetic aortic valve is stable in the prior exam. No mediastinal widening. No mediastinal or hilar masses or evidence of adenopathy. There are small residual pleural effusions. No pneumothorax. Lungs are hyperexpanded. No evidence of pneumonia or pulmonary edema. Pulmonary interstitial edema seen on the prior exam has resolved. Skeletal structures are intact. IMPRESSION: 1. No acute cardiopulmonary disease. 2. Small residual pleural effusions, but no evidence of pulmonary edema. No pneumonia. Electronically Signed   By: Lajean Manes M.D.   On: 09/10/2017 18:05   Dg Chest 2 View  Result Date: 08/31/2017 CLINICAL DATA:  Severe aortic stenosis. Pre operative respiratory exam. EXAM: CHEST - 2 VIEW COMPARISON:  Chest x-ray dated 07/02/2017 FINDINGS: Heart size and pulmonary vascularity are normal. Chronic accentuation of the interstitial markings at the bases. No effusions. No acute bone abnormality. Aortic atherosclerosis. IMPRESSION: No acute abnormalities.  Slight chronic interstitial disease. Aortic Atherosclerosis (ICD10-I70.0). Electronically Signed   By: [name] M.D.   On: 08/31/2017 16:42   Dg Abd 1 View  Result Date: 09/12/2017 CLINICAL DATA:  Low abdominal pain. Status post transfemoral aortic valve replacement March 2019. EXAM: ABDOMEN - 1 VIEW COMPARISON:  08/06/2017 FINDINGS: Normal bowel gas pattern. No stool retention. No concerning mass effect or calcification. Spinal degeneration and osteopenia. Atherosclerotic calcification. IMPRESSION: Normal bowel gas pattern. Electronically  Signed   By: Monte Fantasia M.D.   On: 09/12/2017 10:51   Dg Chest Port 1 View  Result Date: 09/04/2017 CLINICAL DATA:  Status post trans catheter aortic valve replacement. EXAM: PORTABLE CHEST 1 VIEW COMPARISON:  Portable chest x-ray of August 31, 2017 FINDINGS: The lungs are well-expanded. A prosthetic aortic valve cage is visible. The cardiac silhouette is mildly enlarged. The pulmonary vascularity is engorged and indistinct and the pulmonary interstitial markings are increased. There is no pleural effusion or pneumothorax. There is dense calcification in the wall of the thoracic aorta. The right internal jugular venous catheter tip projects over the junction of the proximal and midportions of the SVC. IMPRESSION: Moderate pulmonary interstitial edema following transcatheter aortic valve replacement.  Thoracic aortic atherosclerosis. Electronically Signed   By: David  Martinique M.D.   On: 09/04/2017 12:52       Subjective: He is feeling much better, no diarrhea today. Ate  good breakfast   Discharge Exam: Vitals:   09/15/17 0631 09/15/17 0940  BP: 135/72 129/61  Pulse: 69 79  Resp: 16   Temp: 97.7 F (36.5 C)   SpO2: 92%    Vitals:   09/14/17 1418 09/14/17 2146 09/15/17 0631 09/15/17 0940  BP: 111/74 132/60 135/72 129/61  Pulse: 91 71 69 79  Resp: 18  16   Temp: 97.7 F (36.5 C) 97.7 F (36.5 C) 97.7 F (36.5 C)   TempSrc:  Oral Oral   SpO2: 96% 98% 92%   Weight:      Height:        General: Pt is alert, awake, not in acute distress Cardiovascular: RRR, S1/S2 +, no rubs, no gallops Respiratory: CTA bilaterally, no wheezing, no rhonchi Abdominal: Soft, NT, ND, bowel sounds + Extremities: no edema, no cyanosis    The results of significant diagnostics from this hospitalization (including imaging, microbiology, ancillary and laboratory) are listed below for reference.     Microbiology: Recent Results (from the past 240 hour(s))  Urine Culture     Status: None    Collection Time: 09/12/17  3:14 PM  Result Value Ref Range Status   Specimen Description URINE, CLEAN CATCH  Final   Special Requests NONE  Final   Culture   Final    NO GROWTH Performed at [facility], [address] [place], [place] [address]    Report Status 09/13/2017 FINAL  Final  Gastrointestinal Panel by PCR , Stool     Status: Abnormal   Collection Time: 09/13/17  2:02 AM  Result Value Ref Range Status   Campylobacter species NOT DETECTED NOT DETECTED Final   Plesimonas shigelloides NOT DETECTED NOT DETECTED Final   Salmonella species NOT DETECTED NOT DETECTED Final   Yersinia enterocolitica NOT DETECTED NOT DETECTED Final   Vibrio species NOT DETECTED NOT DETECTED Final   Vibrio cholerae NOT DETECTED NOT DETECTED Final   Enteroaggregative E coli (EAEC) NOT DETECTED NOT DETECTED Final   Enteropathogenic E coli (EPEC) NOT DETECTED NOT DETECTED Final   Enterotoxigenic E coli (ETEC) NOT DETECTED NOT DETECTED Final   Shiga like toxin producing E coli (STEC) NOT DETECTED NOT DETECTED Final   Shigella/Enteroinvasive E coli (EIEC) NOT DETECTED NOT DETECTED Final   Cryptosporidium NOT DETECTED NOT DETECTED Final   Cyclospora cayetanensis NOT DETECTED NOT DETECTED Final   Entamoeba histolytica NOT DETECTED NOT DETECTED Final   Giardia lamblia NOT DETECTED NOT DETECTED Final   Adenovirus F40/41 NOT DETECTED NOT DETECTED Final   Astrovirus NOT DETECTED NOT DETECTED Final   Norovirus GI/GII DETECTED (A) NOT DETECTED Final    Comment: RESULT CALLED TO, READ BACK BY AND VERIFIED WITH: SARAH JONES @1226  09/13/17 AKT    Rotavirus A NOT DETECTED NOT DETECTED Final   Sapovirus (I, II, IV, and V) NOT DETECTED NOT DETECTED Final    Comment: Performed at [facility], [address] [place], [place] [address]     Labs: BNP (last 3 results) Recent Labs    05/20/17 1416 07/02/17 1632 08/31/17 1427  BNP 284.4* 442.5* 875.6*   Basic Metabolic Panel: Recent Labs  Lab  09/10/17 1150 09/11/17 0527 09/12/17 0941 09/13/17 0422 09/14/17 0538  NA 138 136 135 133* 132*  K 4.0 3.7 3.6  4.2 3.6  CL 103 102 100* 100* 101  CO2 24 26 23 23 23   GLUCOSE 125* 87 83 82 85  BUN 18 14 7 10 9   CREATININE 0.79 0.65 0.62 0.66 0.69  CALCIUM 8.6* 8.0* 7.9* 7.9* 7.6*  MG  --   --  1.8  --   --    Liver Function Tests: Recent Labs  Lab 09/10/17 1150  AST 28  ALT 13*  ALKPHOS 50  BILITOT 0.9  PROT 6.9  ALBUMIN 3.5   Recent Labs  Lab 09/12/17 0941  LIPASE 34   No results for input(s): AMMONIA in the last 168 hours. CBC: Recent Labs  Lab 09/10/17 1150 09/11/17 0527 09/12/17 0941 09/13/17 0422  WBC 8.6 5.6 9.0 8.1  NEUTROABS 8.0*  --   --   --   HGB 11.8* 10.5* 10.9* 11.1*  HCT 37.0* 33.5* 35.2* 35.0*  MCV 88.9 90.3 90.5 90.0  PLT 198 162 155 PLATELET CLUMPS NOTED ON SMEAR, UNABLE TO ESTIMATE   Cardiac Enzymes: No results for input(s): CKTOTAL, CKMB, CKMBINDEX, TROPONINI in the last 168 hours. BNP: Invalid input(s): POCBNP CBG: Recent Labs  Lab 09/12/17 0838 09/13/17 0845 09/15/17 0745  GLUCAP 84 79 78   D-Dimer No results for input(s): DDIMER in the last 72 hours. Hgb A1c No results for input(s): HGBA1C in the last 72 hours. Lipid Profile No results for input(s): CHOL, HDL, LDLCALC, TRIG, CHOLHDL, LDLDIRECT in the last 72 hours. Thyroid function studies No results for input(s): TSH, T4TOTAL, T3FREE, THYROIDAB in the last 72 hours.  Invalid input(s): FREET3 Anemia work up No results for input(s): VITAMINB12, FOLATE, FERRITIN, TIBC, IRON, RETICCTPCT in the last 72 hours. Urinalysis    Component Value Date/Time   COLORURINE YELLOW 09/12/2017 [place] 09/12/2017 1514   LABSPEC 1.017 09/12/2017 1514   PHURINE 5.0 09/12/2017 1514   GLUCOSEU NEGATIVE 09/12/2017 1514   HGBUR SMALL (A) 09/12/2017 1514   BILIRUBINUR NEGATIVE 09/12/2017 1514   KETONESUR NEGATIVE 09/12/2017 1514   PROTEINUR NEGATIVE 09/12/2017 1514    NITRITE NEGATIVE 09/12/2017 1514   LEUKOCYTESUR TRACE (A) 09/12/2017 1514   Sepsis Labs Invalid input(s): PROCALCITONIN,  WBC,  LACTICIDVEN Microbiology Recent Results (from the past 240 hour(s))  Urine Culture     Status: None   Collection Time: 09/12/17  3:14 PM  Result Value Ref Range Status   Specimen Description URINE, CLEAN CATCH  Final   Special Requests NONE  Final   Culture   Final    NO GROWTH Performed at [facility], [address] [place],  [address]    Report Status 09/13/2017 FINAL  Final  Gastrointestinal Panel by PCR , Stool     Status: Abnormal   Collection Time: 09/13/17  2:02 AM  Result Value Ref Range Status   Campylobacter species NOT DETECTED NOT DETECTED Final   Plesimonas shigelloides NOT DETECTED NOT DETECTED Final   Salmonella species NOT DETECTED NOT DETECTED Final   Yersinia enterocolitica NOT DETECTED NOT DETECTED Final   Vibrio species NOT DETECTED NOT DETECTED Final   Vibrio cholerae NOT DETECTED NOT DETECTED Final   Enteroaggregative E coli (EAEC) NOT DETECTED NOT DETECTED Final   Enteropathogenic E coli (EPEC) NOT DETECTED NOT DETECTED Final   Enterotoxigenic E coli (ETEC) NOT DETECTED NOT DETECTED Final   Shiga like toxin producing E coli (STEC) NOT DETECTED NOT DETECTED Final   Shigella/Enteroinvasive E coli (EIEC) NOT DETECTED NOT DETECTED Final   Cryptosporidium NOT  DETECTED NOT DETECTED Final   Cyclospora cayetanensis NOT DETECTED NOT DETECTED Final   Entamoeba histolytica NOT DETECTED NOT DETECTED Final   Giardia lamblia NOT DETECTED NOT DETECTED Final   Adenovirus F40/41 NOT DETECTED NOT DETECTED Final   Astrovirus NOT DETECTED NOT DETECTED Final   Norovirus GI/GII DETECTED (A) NOT DETECTED Final    Comment: RESULT CALLED TO, READ BACK BY AND VERIFIED WITH: SARAH JONES @1226  09/13/17 AKT    Rotavirus A NOT DETECTED NOT DETECTED Final   Sapovirus (I, II, IV, and V) NOT DETECTED NOT DETECTED Final    Comment: Performed at  [facility], [address], [place], [place] [address]     Time coordinating discharge: Over 30 minutes  SIGNED:   Elmarie Shiley, MD  [facility] 09/15/2017, 12:02 PM Pager   If 7PM-7AM, please contact night-coverage [email or web address] Password [email or web address]

## 2017-09-17 ENCOUNTER — Ambulatory Visit: Payer: Medicare HMO | Admitting: Physician Assistant

## 2017-09-18 ENCOUNTER — Ambulatory Visit (INDEPENDENT_AMBULATORY_CARE_PROVIDER_SITE_OTHER): Payer: Medicare HMO | Admitting: Pharmacist

## 2017-09-18 ENCOUNTER — Encounter: Payer: Self-pay | Admitting: Adult Health

## 2017-09-18 ENCOUNTER — Ambulatory Visit: Payer: Medicare HMO | Admitting: Adult Health

## 2017-09-18 VITALS — BP 104/62 | HR 72 | Ht 71.0 in | Wt 167.0 lb

## 2017-09-18 DIAGNOSIS — Z952 Presence of prosthetic heart valve: Secondary | ICD-10-CM

## 2017-09-18 DIAGNOSIS — I2510 Atherosclerotic heart disease of native coronary artery without angina pectoris: Secondary | ICD-10-CM | POA: Diagnosis not present

## 2017-09-18 DIAGNOSIS — I482 Chronic atrial fibrillation, unspecified: Secondary | ICD-10-CM

## 2017-09-18 DIAGNOSIS — Z5181 Encounter for therapeutic drug level monitoring: Secondary | ICD-10-CM

## 2017-09-18 DIAGNOSIS — Z79899 Other long term (current) drug therapy: Secondary | ICD-10-CM

## 2017-09-18 LAB — POCT INR: INR: 2.8

## 2017-09-18 MED ORDER — POTASSIUM CHLORIDE CRYS ER 10 MEQ PO TBCR: 10.0000 meq | EXTENDED_RELEASE_TABLET | Freq: Every day | ORAL | 3 refills | Status: DC

## 2017-09-18 MED ORDER — FUROSEMIDE 40 MG PO TABS: 40.0000 mg | ORAL_TABLET | Freq: Every day | ORAL | 3 refills | Status: DC

## 2017-09-18 MED ORDER — DIGOXIN 125 MCG PO TABS: 125.0000 ug | ORAL_TABLET | Freq: Every day | ORAL | 3 refills | Status: DC

## 2017-09-18 MED ORDER — METOPROLOL TARTRATE 25 MG PO TABS: 12.5000 mg | ORAL_TABLET | Freq: Two times a day (BID) | ORAL | 6 refills | Status: DC

## 2017-09-18 NOTE — Progress Notes (Signed)
Cardiology Office Note   Date:  09/18/2017   ID:  Wayne [name], DOB 10-19-1929, MRN [id number]  PCP:  [name], DO  Cardiologist: Dr. Stanford Breed  Chief Complaint  Patient presents with  . Aortic Stenosis    s/p TAVR  . Coronary Artery Disease    Non-obstructive      History of Present Illness: Wayne [name] is a 84 y.o. male who presents for posthospitalization follow-up after admission for severe aortic valve stenosis, with progressive exertional dyspnea, orthopnea, and lower extremity swelling.  Plans were made for cardiac cath with TAVR procedure to follow secondary to pt advanced age, prior stroke and other co-morbid conditions. The patient subsequently underwent cardiac catheterization on 08/01/17 which showed mild non-obstructive CAD with moderate pulmonary HTN with a mean PA pressure of 9mmHg with tracings consistent with severe mitral regurgitation.  On 09/04/17 the patient underwent an elective transcatheter valve replacement (TAVR) per Dr. Burt Knack and Dr. Cyndia Bent without complication.   On discharge the patient was sent home on Lasix 40 mg p.o. daily, reduced dose of metoprolol to 12.5 mg twice daily secondary to stop BP on discharge today, the patient will remain on aspirin 81 mg for 3 months only, continue Coumadin therapy.  Other history includes chronic atrial fibrillation on Coumadin therapy, hypertension, CAD, history of CVA, chronic diastolic heart failure, severe mitral regurgitation.  He has been seen by pharmacy today to check PT/INR, counseling on his Coumadin therapy was also completed.  INR today is 2.8.  The patient's wife states that he has been frail, but is going to be participating in physical rehab.  She is worried about short-term memory loss.  He is not seems to have not gotten his cognitive skills back to baseline prior TAVR procedure.   Wife also reports he has recently had the Noro virus and is still recovering, although no further  nausea or diarrhea.   Past Medical History:  Diagnosis Date  . Aortic stenosis   . Arthritis   . Atrial fibrillation (HCC)    Chronic  . Atrial fibrillation, chronic ([place])   . BPH (benign prostatic hyperplasia)   . CAD (coronary artery disease)   . Chronic anticoagulation   . History of chicken pox   . HTN (hypertension)   . Mitral stenosis   . Old MI (myocardial infarction) 2007   s/p PCI to distal OM (no stent)  . Severe mitral regurgitation   . Stroke, embolic [facility])    1017-5 weeks after his heart attack    Past Surgical History:  Procedure Laterality Date  . CORONARY ANGIOPLASTY  2007   Distal OM  . PROSTATE BIOPSY    . RIGHT/LEFT HEART CATH AND CORONARY ANGIOGRAPHY N/A 08/01/2017   Procedure: RIGHT/LEFT HEART CATH AND CORONARY ANGIOGRAPHY;  Surgeon: Sherren Mocha, MD;  Location: [place] [facility];  Service: Cardiovascular;  Laterality: N/A;  . TEE WITHOUT CARDIOVERSION N/A 07/20/2017   Procedure: TRANSESOPHAGEAL ECHOCARDIOGRAM (TEE);  Surgeon: Lelon Perla, MD;  Location: [facility] ENDOSCOPY;  Service: Cardiovascular;  Laterality: N/A;  . TEE WITHOUT CARDIOVERSION N/A 09/04/2017   Procedure: TRANSESOPHAGEAL ECHOCARDIOGRAM (TEE);  Surgeon: Sherren Mocha, MD;  Location: [place];  Service: Open Heart Surgery;  Laterality: N/A;  . TRANSCATHETER AORTIC VALVE REPLACEMENT, TRANSFEMORAL N/A 09/04/2017   Procedure: TRANSCATHETER AORTIC VALVE REPLACEMENT, TRANSFEMORAL;  Surgeon: Sherren Mocha, MD;  Location: [place];  Service: Open Heart Surgery;  Laterality: N/A;     Current Outpatient Medications  Medication Sig Dispense Refill  .  acetaminophen (TYLENOL) 500 MG tablet Take 1,000 mg by mouth every 6 (six) hours as needed for moderate pain or headache.    . alfuzosin (UROXATRAL) 10 MG 24 hr tablet Take 10 mg by mouth daily with breakfast.    . ARTIFICIAL TEAR OP Place 1 drop into both eyes daily as needed (dry eyes).    Marland Kitchen aspirin EC 81 MG EC tablet Take 1 tablet (81 mg total) by  mouth daily. 30 tablet 2  . calcium carbonate (TUMS - DOSED IN MG ELEMENTAL CALCIUM) 500 MG chewable tablet Chew 1-2 tablets by mouth daily as needed for indigestion or heartburn.    . cholecalciferol 2000 units TABS Take 1 tablet (2,000 Units total) by mouth daily. 30 tablet 0  . Dextromethorphan-Guaifenesin (MUCINEX DM) 30-600 MG TB12 Take 1-2 tablets by mouth every 12 (twelve) hours as needed (for cough/congestion.).    Marland Kitchen digoxin (LANOXIN) 0.125 MG tablet Take 1 tablet (125 mcg total) by mouth daily. 90 tablet 3  . feeding supplement, ENSURE ENLIVE, (ENSURE ENLIVE) LIQD Take 237 mLs by mouth 2 (two) times daily between meals. 237 mL 12  . finasteride (PROSCAR) 5 MG tablet Take 5 mg by mouth at bedtime.     . furosemide (LASIX) 40 MG tablet Take 1 tablet (40 mg total) by mouth daily. 30 tablet 3  . Melatonin 1 MG TABS Take 2 mg by mouth at bedtime.     . Menthol, Topical Analgesic, (BIOFREEZE EX) Apply 1 application topically 3 (three) times daily as needed (muscle pain in back). alterrnates between [employer] and Blu Emu    . Menthol, Topical Analgesic, (BLUE-EMU MAXIMUM STRENGTH EX) Apply 1 application topically 3 (three) times daily as needed (muscle pain in back). alterrnates between [employer] and [employer]mu    . methocarbamol (ROBAXIN) 500 MG tablet Take 500 mg by mouth daily as needed (cramps).     . metoprolol tartrate (LOPRESSOR) 25 MG tablet Take 0.5 tablets (12.5 mg total) by mouth 2 (two) times daily. 30 tablet 6  . potassium chloride (K-DUR,KLOR-CON) 10 MEQ tablet Take 1 tablet (10 mEq total) by mouth daily. 30 tablet 3  . saccharomyces boulardii (FLORASTOR) 250 MG capsule Take 1 capsule (250 mg total) by mouth 2 (two) times daily. 30 capsule 0  . sodium chloride (OCEAN) 0.65 % SOLN nasal spray Place 1 spray into both nostrils daily as needed for congestion.    . triamcinolone cream (KENALOG) 0.1 % Apply 1 application topically daily as needed (dry skin).     Marland Kitchen warfarin (COUMADIN) 2.5 MG  tablet Take 2.5 mg on Saturday, Tuesday and Thursday.  Take 5 mg on Sunday, Monday and Wednesday. 30 tablet 0   No current facility-administered medications for this visit.     Allergies:   Penicillins; Prednisone; Procaine hcl; Levofloxacin; and Oxycodone    Social History:  The patient  reports that he has never smoked. He has never used smokeless tobacco. He reports that he does not drink alcohol or use drugs.   Family History:  The patient's family history includes Heart attack in his brother, father, mother, and sister; Heart disease in his father and mother; Stroke in his brother.    ROS: All other systems are reviewed and negative. Unless otherwise mentioned in H&P    PHYSICAL EXAM: VS:  BP 104/62   Pulse 72   Ht 5\' 11"  (1.803 m)   Wt 167 lb (75.8 kg)   BMI 23.29 kg/m  , BMI Body mass  index is 23.29 kg/m. GEN: Well nourished, well developed, in no acute distress thin, frail, stoic HEENT: normal  Neck: no JVD, carotid bruits, or masses Cardiac: RRR; soft systolic murmur, rubs, or gallops,no edema  Respiratory:  Clear to auscultation bilaterally, normal work of breathing GI: soft, nontender, nondistended, + BS MS: no deformity or atrophy right groin no bruits or discoloration.  Left groin mild ecchymosis which is dissipating.  No bruit. Skin: warm and dry, no rash Neuro:  Strength and sensation are intact Psych: euthymic mood, full affect   EKG: Atrial fibrillation, coarse in lead V1, nonspecific ST wave abnormality.  Mild LVH heart rate of 72 bpm.  Recent Labs: 05/20/2017: TSH 2.425 08/31/2017: B Natriuretic Peptide 343.3 09/10/2017: ALT 13 09/12/2017: Magnesium 1.8 09/13/2017: Hemoglobin 11.1; Platelets PLATELET CLUMPS NOTED ON SMEAR, UNABLE TO ESTIMATE 09/14/2017: BUN 9; Creatinine, Ser 0.69; Potassium 3.6; Sodium 132    Lipid Panel    Component Value Date/Time   CHOL 176 01/27/2016 0938   TRIG 274 (H) 01/27/2016 0938   HDL 29 (L) 01/27/2016 0938   CHOLHDL 6.1  (H) 01/27/2016 0938   VLDL 55 (H) 01/27/2016 0938   LDLCALC 92 01/27/2016 0938   LDLDIRECT 87.0 05/18/2014 0922      Wt Readings from Last 3 Encounters:  09/18/17 167 lb (75.8 kg)  09/11/17 161 lb 6 oz (73.2 kg)  09/06/17 168 lb 6.9 oz (76.4 kg)      Other studies Reviewed: Echocardiogram 09-08-2017 Study Conclusions  - Left ventricle: The cavity size was normal. Wall thickness was   increased in a pattern of moderate LVH. Systolic function was   normal. The estimated ejection fraction was in the range of 60%   to 65%. Wall motion was normal; there were no regional wall   motion abnormalities. - Aortic valve: A bioprosthesis was present. - Mitral valve: Moderately calcified annulus. Moderately thickened,   moderately calcified leaflets . The findings are consistent with   moderate to severe stenosis. There was mild to moderate   regurgitation. Valve area by pressure half-time: 2.18 cm^2. - Left atrium: The atrium was severely dilated. - Right atrium: The atrium was severely dilated. - Tricuspid valve: There was mild-moderate regurgitation. - Pulmonary arteries: Systolic pressure was moderately increased.   PA peak pressure: 49 mm Hg (S).  Cardiac Cath 08/01/2017 Conclusion   1. Calcified coronary arteries with mild nonobstructive CAD 2. Moderate pulmonary HTN with mPAP 37 mmHg 3. Large V waves 41 mmHg consistent with severe mitral regurgitation  Recommend: continue evaluation by the multidisciplinary heart valve team    ASSESSMENT AND PLAN:  1.  Severe AoV stenosis: He is status post aortic valve repair with T AVR.  He still appears frail and weak but denies any discomfort, or dyspnea.  His wife is very attentive.  She is concerned that his cognitive status has not returned.  She states that the surgeon told her it was related to anesthesia.  That it would be better over time.  He is to follow-up with Dr. Burt Knack on May 8 at 4 PM, after having had a repeat echocardiogram.   No changes in his medication regimen.  He will have a BMET and a dig level drawn prior to being seen by Dr.Cooper.  2.  Chronic atrial fibrillation: Heart rate is currently well controlled.  He is continued on Coumadin therapy and is followed by our office.  He has been seen by Pharm.D. with INR today of 2.8.  3.  History of  CVA: Overall frail but does not have any focal neuro deficits.  He is having some issues with memory especially short-term.  His wife is especially concerned with this.  I have advised him to an eye on this and worsen he should follow-up with PCP or neurologist.  4.  CAD: Stent to distal OM 2007.  Cardiac cath revealing nonobstructive disease.  He is asymptomatic  5.  History of recent GI virus: He may be a little dehydrated.  Waiting on labs.  May need to hold Lasix   Current medicines are reviewed at length with the patient today.    Labs/ tests ordered today include: BMET, dig level  Phill Myron. West Pugh, ANP, AACC   09/18/2017 1:26 PM    [facility] [address] [place], [place] [address] Phone: [phone]; Fax: [phone]

## 2017-09-18 NOTE — Patient Instructions (Signed)
Medication Instructions:  NO CHANGES- Your physician recommends that you continue on your current medications as directed. Please refer to the Current Medication list given to you today.  If you need a refill on your cardiac medications before your next appointment, please call your pharmacy.  Labwork: BMET AND DIGOXIN BEFORE FOLLOW UP APPT W/[facility] IN MAY HERE IN OUR OFFICE AT [facility]  Take the provided lab slips for you to take with you to the lab for you blood draw.   You will NOT need to fast   Follow-Up: Your physician wants you to follow-up in: [place]     Thank you for choosing [facility] at [facility]!!

## 2017-09-20 ENCOUNTER — Ambulatory Visit: Payer: Medicare HMO | Admitting: Physician Assistant

## 2017-09-21 ENCOUNTER — Telehealth: Payer: Self-pay | Admitting: Cardiology

## 2017-09-21 DIAGNOSIS — N401 Enlarged prostate with lower urinary tract symptoms: Secondary | ICD-10-CM | POA: Diagnosis not present

## 2017-09-21 DIAGNOSIS — I482 Chronic atrial fibrillation: Secondary | ICD-10-CM | POA: Diagnosis not present

## 2017-09-21 DIAGNOSIS — I340 Nonrheumatic mitral (valve) insufficiency: Secondary | ICD-10-CM | POA: Diagnosis not present

## 2017-09-21 DIAGNOSIS — Z48812 Encounter for surgical aftercare following surgery on the circulatory system: Secondary | ICD-10-CM | POA: Diagnosis not present

## 2017-09-21 DIAGNOSIS — I2510 Atherosclerotic heart disease of native coronary artery without angina pectoris: Secondary | ICD-10-CM | POA: Diagnosis not present

## 2017-09-21 DIAGNOSIS — Z8673 Personal history of transient ischemic attack (TIA), and cerebral infarction without residual deficits: Secondary | ICD-10-CM | POA: Diagnosis not present

## 2017-09-21 DIAGNOSIS — I110 Hypertensive heart disease with heart failure: Secondary | ICD-10-CM | POA: Diagnosis not present

## 2017-09-21 DIAGNOSIS — Z952 Presence of prosthetic heart valve: Secondary | ICD-10-CM | POA: Diagnosis not present

## 2017-09-21 DIAGNOSIS — I5033 Acute on chronic diastolic (congestive) heart failure: Secondary | ICD-10-CM | POA: Diagnosis not present

## 2017-09-21 DIAGNOSIS — N320 Bladder-neck obstruction: Secondary | ICD-10-CM | POA: Diagnosis not present

## 2017-09-21 NOTE — Telephone Encounter (Signed)
Spoke with [employer]. Notified her that Doctor, hospital spoke with [employer] @ [place] that PT orders are OK.   Asked that she contact PCP for GI symptoms as patient was recently in hospital for gastroenteritis and it was suggested he f/up with PCP in 1-2 weeks.

## 2017-09-21 NOTE — Telephone Encounter (Signed)
[place] crew from [employer] calling to request order for PT.  Please call [phone] Fax [phone]

## 2017-09-21 NOTE — Telephone Encounter (Signed)
New message  Wayne [name] verbalized that she is calling for RN  Home health 1x week for 1 week  And 2x week for 3 weeks  He reported abdominal pain and it started a couple of days ago and its in his left lower quadrant  The pain is worse when he moves from laying down to standing up  The rate is a 7 out of 10 He had a BM today  No changes with his bowels normal stool

## 2017-09-21 NOTE — Telephone Encounter (Signed)
Verbal order for PT given

## 2017-09-24 ENCOUNTER — Ambulatory Visit (INDEPENDENT_AMBULATORY_CARE_PROVIDER_SITE_OTHER): Payer: Medicare HMO | Admitting: Family Medicine

## 2017-09-24 ENCOUNTER — Ambulatory Visit: Payer: Medicare HMO

## 2017-09-24 ENCOUNTER — Encounter: Payer: Self-pay | Admitting: Family Medicine

## 2017-09-24 ENCOUNTER — Other Ambulatory Visit: Payer: Self-pay | Admitting: Cardiology

## 2017-09-24 VITALS — BP 119/76 | HR 84 | Ht 71.0 in | Wt 159.0 lb

## 2017-09-24 DIAGNOSIS — M7918 Myalgia, other site: Secondary | ICD-10-CM

## 2017-09-24 DIAGNOSIS — M169 Osteoarthritis of hip, unspecified: Secondary | ICD-10-CM

## 2017-09-24 DIAGNOSIS — M25552 Pain in left hip: Secondary | ICD-10-CM

## 2017-09-24 DIAGNOSIS — M1612 Unilateral primary osteoarthritis, left hip: Secondary | ICD-10-CM | POA: Diagnosis not present

## 2017-09-24 NOTE — Patient Instructions (Signed)
Please ice for 15-20 minutes 4-5 times per day.  This pain should resolve in 10-14 days.  If it does not please let us know if it does not continue to slowly improve with time as you may need to go see a sports medicine physician who can do a injection for arthritis and other interventional procedures to help improve your pain and dysfunction. -Continue use your walker and/or the cane which is always helpful to keep you steady on your feet.  Gradually improve and increase your activity levels as long as you are not having pain, then continue to walk/ move around.

## 2017-09-24 NOTE — Progress Notes (Signed)
Pt here for an acute care OV today   Impression and Recommendations:    1. Hip pain, acute, left   2. Left buttock pain   3. Osteoarthritis of hip, unspecified laterality, unspecified osteoarthritis type     1. Hip pain L, acute- 2. L Buttock pain- 3. Osteoarthritis of hip -Xray done of lower back/hip in office today. No acute findings found on Xray other than age-related changes of arthritis and other age-related changes of vessels etc. Will call pt if any abnormalities found by radiology. -Apply ice 4-5 times a day for 15 minutes at a time. -Continue taking OTC medications for pain. -If symptoms fail to improve or worsen, call the office and we will refer you to sports medicine for further evaluation. -Continue using your walker to assist you in getting around.   Orders Placed This Encounter  Procedures  . DG HIP UNILAT WITH PELVIS 1V LEFT     Education and routine counseling performed. Handouts provided  Gross side effects, risk and benefits, and alternatives of medications and treatment plan in general discussed with patient.  Patient is aware that all medications have potential side effects and we are unable to predict every side effect or drug-drug interaction that may occur.   Patient will call with any questions prior to using medication if they have concerns.  Expresses verbal understanding and consents to current therapy and treatment regimen.  No barriers to understanding were identified.  Red flag symptoms and signs discussed in detail.  Patient expressed understanding regarding what to do in case of emergency\urgent symptoms   Please see AVS handed out to patient at the end of our visit for further patient instructions/ counseling done pertaining to today's office visit.   Return for F-up of chronic med issues as previously d/c pt every 4 mo or so..     Note: This document was prepared occasionally using Dragon voice recognition software and may include  unintentional dictation errors in addition to a scribe.  This document serves as a record of services personally performed by [name], DO. It was created on her behalf by Mayer Masker, a trained medical scribe. The creation of this record is based on the scribe's personal observations and the provider's statements to them.   I have reviewed the above medical documentation for accuracy and completeness and I concur.  [name] 09/24/17 11:41 AM   ------------------------------------------------------------------------------------------------------------------------------------------------------------------------------------------------------------------   Subjective:    CC:  Chief Complaint  Patient presents with  . Hip Pain    HPI: Wayne [name] is a 84 y.o. male who presents to [place] at [facility] today for issues as discussed below.  Hip/Lower back/buttocks He complains of L hip/lower back/left buttocks pain that began a few days ago. He describes this as "just a hurt". He does not recall any trauma, mechanism of injury, or fall. The pain does not travel down his hind legs. He states any movement makes the pain worse. Lying down does not exacerbate his pain. He has tried icing/warm compress and taking ibuprofen/tylenol with mild to no relief. He has been trying to walk with the pain. He does not know which activity makes his pain worse. He denies numbness/tingling. He has been taking glucosamine chondroitin supplements.  His wife states he was recently hospitalized from having norovirus and valve replacement. He is doing well s/p surgery and is going to start cardiac rehab soon. He is on blood thinners.    Problem  Osteoarthritis  of Hip     Wt Readings from Last 3 Encounters:  09/24/17 159 lb (72.1 kg)  09/18/17 167 lb (75.8 kg)  09/11/17 161 lb 6 oz (73.2 kg)   BP Readings from Last 3 Encounters:  09/24/17 119/76  09/18/17 104/62    09/15/17 129/61   BMI Readings from Last 3 Encounters:  09/24/17 22.18 kg/m  09/18/17 23.29 kg/m  09/11/17 22.51 kg/m     Patient Care Team    Relationship Specialty Notifications Start End  [name], DO PCP - General Family Medicine  10/23/16   Lelon Perla, MD PCP - Cardiology Cardiology Admissions 07/06/17   Jarome Matin, MD Consulting Physician Dermatology  10/23/16   Kathie Rhodes, MD Consulting Physician Urology  10/23/16   Latanya Maudlin, MD Consulting Physician Orthopedic Surgery  11/16/16    Comment: back pain     Patient Active Problem List   Diagnosis Date Noted  . Vitamin D deficiency 11/16/2016    Priority: High  . H/O non anemic vitamin B12 deficiency 11/16/2016    Priority: High  . Mild Colonic dysmotility 11/16/2016    Priority: High  . Chronic atrial fibrillation ([place]) 09/13/2010    Priority: High  . Chronic anticoagulation     Priority: Medium  . HTN (hypertension)     Priority: Medium  . CAD (coronary artery disease)     Priority: Medium  . h/o CVA (cerebrovascular accident due to intracerebral hemorrhage) 09/13/2010    Priority: Medium  . BPH with obstruction/lower urinary tract symptoms 09/13/2016    Priority: Low  . Osteoarthritis of hip 09/24/2017  . Gastroenteritis 09/10/2017  . S/P TAVR (transcatheter aortic valve replacement) 09/04/2017  . Severe mitral regurgitation   . Insomnia 07/10/2017  . High risk medication use 07/10/2017  . Long term current use of anticoagulant 07/10/2017  . Chronic diastolic CHF (congestive heart failure) ([place]) 05/20/2017  . Hyponatremia 05/20/2017  . Severe aortic stenosis 05/20/2017  . Urinary retention   . 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 Spinal stenosis at L4-L5 level 04/26/2017  . Lumbar radiculopathy, chronic 04/26/2017  . Benign colonic polyp- 25 yrs ago.  11/16/2016  . Hypokalemia 09/13/2016  . Urinary frequency 07/10/2016  . Left bundle branch block  (LBBB) on electrocardiogram 06/30/2016  . History of stroke 06/30/2016  . Generalized weakness 06/30/2016  . Osteoarthritis 04/04/2016  . Encounter for therapeutic drug monitoring 08/06/2013    Past Medical history, Surgical history, Family history, Social history, Allergies and Medications have been entered into the medical record, reviewed and changed as needed.    Current Meds  Medication Sig  . acetaminophen (TYLENOL) 500 MG tablet Take 1,000 mg by mouth every 6 (six) hours as needed for moderate pain or headache.  . alfuzosin (UROXATRAL) 10 MG 24 hr tablet Take 10 mg by mouth daily with breakfast.  . ARTIFICIAL TEAR OP Place 1 drop into both eyes daily as needed (dry eyes).  Marland Kitchen aspirin EC 81 MG EC tablet Take 1 tablet (81 mg total) by mouth daily.  . calcium carbonate (TUMS - DOSED IN MG ELEMENTAL CALCIUM) 500 MG chewable tablet Chew 1-2 tablets by mouth daily as needed for indigestion or heartburn.  . cholecalciferol 2000 units TABS Take 1 tablet (2,000 Units total) by mouth daily.  Marland Kitchen Dextromethorphan-Guaifenesin (MUCINEX DM) 30-600 MG TB12 Take 1-2 tablets by mouth every 12 (twelve) hours as needed (for cough/congestion.).  Marland Kitchen digoxin (LANOXIN) 0.125 MG tablet Take 1 tablet (125 mcg  total) by mouth daily.  . feeding supplement, ENSURE ENLIVE, (ENSURE ENLIVE) LIQD Take 237 mLs by mouth 2 (two) times daily between meals.  . finasteride (PROSCAR) 5 MG tablet Take 5 mg by mouth at bedtime.   . furosemide (LASIX) 40 MG tablet Take 1 tablet (40 mg total) by mouth daily.  . Melatonin 1 MG TABS Take 2 mg by mouth at bedtime.   . Menthol, Topical Analgesic, (BIOFREEZE EX) Apply 1 application topically 3 (three) times daily as needed (muscle pain in back). alterrnates between [employer] and Blu Emu  . Menthol, Topical Analgesic, (BLUE-EMU MAXIMUM STRENGTH EX) Apply 1 application topically 3 (three) times daily as needed (muscle pain in back). alterrnates between [employer] and [employer]mu  .  methocarbamol (ROBAXIN) 500 MG tablet Take 500 mg by mouth daily as needed (cramps).   . metoprolol tartrate (LOPRESSOR) 25 MG tablet Take 0.5 tablets (12.5 mg total) by mouth 2 (two) times daily.  . potassium chloride (K-DUR,KLOR-CON) 10 MEQ tablet Take 1 tablet (10 mEq total) by mouth daily.  Marland Kitchen saccharomyces boulardii (FLORASTOR) 250 MG capsule Take 1 capsule (250 mg total) by mouth 2 (two) times daily.  . sodium chloride (OCEAN) 0.65 % SOLN nasal spray Place 1 spray into both nostrils daily as needed for congestion.  . triamcinolone cream (KENALOG) 0.1 % Apply 1 application topically daily as needed (dry skin).   Marland Kitchen warfarin (COUMADIN) 2.5 MG tablet Take 2.5 mg on Saturday, Tuesday and Thursday.  Take 5 mg on Sunday, Monday and Wednesday.    Allergies:  Allergies  Allergen Reactions  . Penicillins Rash and Other (See Comments)    PATIENT HAS HAD A PCN REACTION WITH IMMEDIATE RASH, FACIAL/TONGUE/THROAT SWELLING, SOB, OR LIGHTHEADEDNESS WITH HYPOTENSION:  #  #  #  YES  #  #  #   Has patient had a PCN reaction causing severe rash involving mucus membranes or skin necrosis: No Has patient had a PCN reaction that required hospitalization No Has patient had a PCN reaction occurring within the last 10 years: No If all of the above answers are "NO", then may proceed with Cephalosporin use.  . Prednisone Other (See Comments)    Wheezing  . Procaine Hcl Other (See Comments)    Passed out after being given this at dental appointment  . Levofloxacin Rash  . Oxycodone Other (See Comments)    constipation     Review of Systems: General:   Denies fever, chills, unexplained weight loss.  Optho/Auditory:   Denies visual changes, blurred vision/LOV Respiratory:   Denies wheeze, DOE more than baseline levels.  Cardiovascular:   Denies chest pain, palpitations, new onset peripheral edema  Gastrointestinal:   Denies nausea, vomiting, diarrhea, abd pain.  Genitourinary: Denies dysuria, freq/ urgency,  flank pain or discharge from genitals.  Endocrine:     Denies hot or cold intolerance, polyuria, polydipsia. Musculoskeletal:   Denies unexplained myalgias, joint swelling, unexplained arthralgias, gait problems.  Skin:  Denies new onset rash, suspicious lesions Neurological:     Denies dizziness, unexplained weakness, numbness  Psychiatric/Behavioral:   Denies mood changes, suicidal or homicidal ideations, hallucinations    Objective:   Blood pressure 119/76, pulse 84, height 5\' 11"  (1.803 m), weight 159 lb (72.1 kg), SpO2 96 %. Body mass index is 22.18 kg/m. General:  Well Developed, well nourished, appropriate for stated age.  Neuro:  Alert and oriented,  extra-ocular muscles intact  HEENT:  Normocephalic, atraumatic, neck supple Skin:  no gross rash,  warm, pink. Cardiac:  RRR, S1 S2 Respiratory:  ECTA B/L and A/P, Not using accessory muscles, speaking in full sentences- unlabored. Vascular:  Ext warm, no cyanosis apprec.; cap RF less 2 sec. Psych:  No HI/SI, judgement and insight good, Euthymic mood. Full Affect. Back/Hip No bony tenderness, no ASIS, PSIS, ischial tuberosity tenderness, no greater trochanter tenderness, no ileal tenderness. No muscle tenderness or tender points in gluteus region. No neuropathy appreciated or neuropathic signs. ROM limited due to age and inability to get onto examining table etc.

## 2017-09-25 NOTE — Telephone Encounter (Signed)
REFILL 

## 2017-09-28 DIAGNOSIS — Z8673 Personal history of transient ischemic attack (TIA), and cerebral infarction without residual deficits: Secondary | ICD-10-CM | POA: Diagnosis not present

## 2017-09-28 DIAGNOSIS — I482 Chronic atrial fibrillation: Secondary | ICD-10-CM | POA: Diagnosis not present

## 2017-09-28 DIAGNOSIS — I110 Hypertensive heart disease with heart failure: Secondary | ICD-10-CM | POA: Diagnosis not present

## 2017-09-28 DIAGNOSIS — N320 Bladder-neck obstruction: Secondary | ICD-10-CM | POA: Diagnosis not present

## 2017-09-28 DIAGNOSIS — N401 Enlarged prostate with lower urinary tract symptoms: Secondary | ICD-10-CM | POA: Diagnosis not present

## 2017-09-28 DIAGNOSIS — Z952 Presence of prosthetic heart valve: Secondary | ICD-10-CM | POA: Diagnosis not present

## 2017-09-28 DIAGNOSIS — Z48812 Encounter for surgical aftercare following surgery on the circulatory system: Secondary | ICD-10-CM | POA: Diagnosis not present

## 2017-09-28 DIAGNOSIS — I340 Nonrheumatic mitral (valve) insufficiency: Secondary | ICD-10-CM | POA: Diagnosis not present

## 2017-09-28 DIAGNOSIS — I5033 Acute on chronic diastolic (congestive) heart failure: Secondary | ICD-10-CM | POA: Diagnosis not present

## 2017-09-28 DIAGNOSIS — I2510 Atherosclerotic heart disease of native coronary artery without angina pectoris: Secondary | ICD-10-CM | POA: Diagnosis not present

## 2017-10-02 ENCOUNTER — Telehealth: Payer: Self-pay | Admitting: Family Medicine

## 2017-10-02 NOTE — Telephone Encounter (Signed)
[facility] called to request Verbal Authorization for:   Speech Therapy Assessment   ----- Please call [phone].  --glh

## 2017-10-03 ENCOUNTER — Other Ambulatory Visit: Payer: Self-pay

## 2017-10-03 DIAGNOSIS — I340 Nonrheumatic mitral (valve) insufficiency: Secondary | ICD-10-CM | POA: Diagnosis not present

## 2017-10-03 DIAGNOSIS — Z952 Presence of prosthetic heart valve: Secondary | ICD-10-CM | POA: Diagnosis not present

## 2017-10-03 DIAGNOSIS — Z8673 Personal history of transient ischemic attack (TIA), and cerebral infarction without residual deficits: Secondary | ICD-10-CM | POA: Diagnosis not present

## 2017-10-03 DIAGNOSIS — N320 Bladder-neck obstruction: Secondary | ICD-10-CM | POA: Diagnosis not present

## 2017-10-03 DIAGNOSIS — I2510 Atherosclerotic heart disease of native coronary artery without angina pectoris: Secondary | ICD-10-CM | POA: Diagnosis not present

## 2017-10-03 DIAGNOSIS — I482 Chronic atrial fibrillation: Secondary | ICD-10-CM | POA: Diagnosis not present

## 2017-10-03 DIAGNOSIS — I110 Hypertensive heart disease with heart failure: Secondary | ICD-10-CM | POA: Diagnosis not present

## 2017-10-03 DIAGNOSIS — Z48812 Encounter for surgical aftercare following surgery on the circulatory system: Secondary | ICD-10-CM | POA: Diagnosis not present

## 2017-10-03 DIAGNOSIS — I5033 Acute on chronic diastolic (congestive) heart failure: Secondary | ICD-10-CM | POA: Diagnosis not present

## 2017-10-03 DIAGNOSIS — M1612 Unilateral primary osteoarthritis, left hip: Secondary | ICD-10-CM

## 2017-10-03 DIAGNOSIS — N401 Enlarged prostate with lower urinary tract symptoms: Secondary | ICD-10-CM | POA: Diagnosis not present

## 2017-10-03 NOTE — Telephone Encounter (Signed)
LVM informing Remo Lipps that speech therapy assessment is authorized per Dr. Raliegh Scarlet.  Charyl Bigger, CMA

## 2017-10-03 NOTE — Progress Notes (Signed)
Per Dr. Hershal Coria instructions after reviewing the xray report, placed order for SI joint injections. MPulliam, CMA/RT(R)

## 2017-10-05 ENCOUNTER — Telehealth: Payer: Self-pay | Admitting: *Deleted

## 2017-10-05 NOTE — Telephone Encounter (Signed)
Pt is s/p TAVR 09/04/17. I would check with Dr Burt Knack about stopping the Coumadin and if the patient needs SBE prophylaxis.  Kerin Ransom PA-C 10/05/2017 2:00 PM

## 2017-10-05 NOTE — Telephone Encounter (Signed)
Ok to hold warfarin x 4 days. SBE prophylaxis not indicated for this type of procedure. thx

## 2017-10-05 NOTE — Telephone Encounter (Signed)
   [facility] Pre-operative Risk Assessment    Request for surgical clearance:  1. What type of surgery is being performed? SI joint   2. When is this surgery scheduled? TBD   3. What type of clearance is required (medical clearance vs. Pharmacy clearance to hold med vs. Both)? pharm  4. Are there any medications that need to be held prior to surgery and how long? Warfarin x 4 days   5. Practice name and name of physician performing surgery? Wayne [name]   6. What is your office phone number [phone]    7.   What is your office fax number [phone]  8.   Anesthesia type (None, local, MAC, general) ?    Wayne [name] A Wayne [name] 10/05/2017, 10:43 AM  _________________________________________________________________   (provider comments below)

## 2017-10-08 ENCOUNTER — Telehealth: Payer: Self-pay | Admitting: Family Medicine

## 2017-10-08 NOTE — Telephone Encounter (Signed)
LVM for Belenda Cruise authorizing OT.  Charyl Bigger, CMA

## 2017-10-08 NOTE — Telephone Encounter (Signed)
Belenda Cruise, OT with [facility] is requesting verbal orders for additional OT. Requesting once a week for 4 weeks. Please advise at [phone]

## 2017-10-09 DIAGNOSIS — Z8673 Personal history of transient ischemic attack (TIA), and cerebral infarction without residual deficits: Secondary | ICD-10-CM | POA: Diagnosis not present

## 2017-10-09 DIAGNOSIS — N401 Enlarged prostate with lower urinary tract symptoms: Secondary | ICD-10-CM | POA: Diagnosis not present

## 2017-10-09 DIAGNOSIS — I5033 Acute on chronic diastolic (congestive) heart failure: Secondary | ICD-10-CM | POA: Diagnosis not present

## 2017-10-09 DIAGNOSIS — Z48812 Encounter for surgical aftercare following surgery on the circulatory system: Secondary | ICD-10-CM | POA: Diagnosis not present

## 2017-10-09 DIAGNOSIS — Z952 Presence of prosthetic heart valve: Secondary | ICD-10-CM | POA: Diagnosis not present

## 2017-10-09 DIAGNOSIS — I340 Nonrheumatic mitral (valve) insufficiency: Secondary | ICD-10-CM | POA: Diagnosis not present

## 2017-10-09 DIAGNOSIS — I110 Hypertensive heart disease with heart failure: Secondary | ICD-10-CM | POA: Diagnosis not present

## 2017-10-09 DIAGNOSIS — I2510 Atherosclerotic heart disease of native coronary artery without angina pectoris: Secondary | ICD-10-CM | POA: Diagnosis not present

## 2017-10-09 DIAGNOSIS — N320 Bladder-neck obstruction: Secondary | ICD-10-CM | POA: Diagnosis not present

## 2017-10-09 DIAGNOSIS — I482 Chronic atrial fibrillation: Secondary | ICD-10-CM | POA: Diagnosis not present

## 2017-10-09 NOTE — Telephone Encounter (Signed)
Spoke with pt's wife per DPR , she states that he no longer needs a pharmacy clearance due to cancellation of procedure.

## 2017-10-10 DIAGNOSIS — I482 Chronic atrial fibrillation: Secondary | ICD-10-CM | POA: Diagnosis not present

## 2017-10-10 DIAGNOSIS — I5033 Acute on chronic diastolic (congestive) heart failure: Secondary | ICD-10-CM | POA: Diagnosis not present

## 2017-10-10 DIAGNOSIS — Z952 Presence of prosthetic heart valve: Secondary | ICD-10-CM | POA: Diagnosis not present

## 2017-10-10 DIAGNOSIS — N401 Enlarged prostate with lower urinary tract symptoms: Secondary | ICD-10-CM | POA: Diagnosis not present

## 2017-10-10 DIAGNOSIS — I2510 Atherosclerotic heart disease of native coronary artery without angina pectoris: Secondary | ICD-10-CM | POA: Diagnosis not present

## 2017-10-10 DIAGNOSIS — N320 Bladder-neck obstruction: Secondary | ICD-10-CM | POA: Diagnosis not present

## 2017-10-10 DIAGNOSIS — Z8673 Personal history of transient ischemic attack (TIA), and cerebral infarction without residual deficits: Secondary | ICD-10-CM | POA: Diagnosis not present

## 2017-10-10 DIAGNOSIS — I110 Hypertensive heart disease with heart failure: Secondary | ICD-10-CM | POA: Diagnosis not present

## 2017-10-10 DIAGNOSIS — I340 Nonrheumatic mitral (valve) insufficiency: Secondary | ICD-10-CM | POA: Diagnosis not present

## 2017-10-10 DIAGNOSIS — Z48812 Encounter for surgical aftercare following surgery on the circulatory system: Secondary | ICD-10-CM | POA: Diagnosis not present

## 2017-10-12 ENCOUNTER — Ambulatory Visit (INDEPENDENT_AMBULATORY_CARE_PROVIDER_SITE_OTHER): Payer: Medicare HMO | Admitting: Pharmacist

## 2017-10-12 DIAGNOSIS — Z5181 Encounter for therapeutic drug level monitoring: Secondary | ICD-10-CM

## 2017-10-12 DIAGNOSIS — I482 Chronic atrial fibrillation, unspecified: Secondary | ICD-10-CM

## 2017-10-12 DIAGNOSIS — Z79899 Other long term (current) drug therapy: Secondary | ICD-10-CM | POA: Diagnosis not present

## 2017-10-12 LAB — POCT INR: INR: 2.1

## 2017-10-13 LAB — BASIC METABOLIC PANEL
BUN/Creatinine Ratio: 22 (ref 10–24)
BUN: 18 mg/dL (ref 8–27)
CALCIUM: 9.1 mg/dL (ref 8.6–10.2)
CO2: 24 mmol/L (ref 20–29)
Chloride: 100 mmol/L (ref 96–106)
Creatinine, Ser: 0.82 mg/dL (ref 0.76–1.27)
GFR, EST AFRICAN AMERICAN: 93 mL/min/{1.73_m2} (ref >59)
GFR, EST NON AFRICAN AMERICAN: 80 mL/min/{1.73_m2} (ref >59)
Glucose: 102 mg/dL — ABNORMAL HIGH (ref 65–99)
Potassium: 4.4 mmol/L (ref 3.5–5.2)
Sodium: 142 mmol/L (ref 134–144)

## 2017-10-13 LAB — DIGOXIN LEVEL: Digoxin, Serum: 1.1 ng/mL — ABNORMAL HIGH (ref 0.5–0.9)

## 2017-10-15 DIAGNOSIS — R3914 Feeling of incomplete bladder emptying: Secondary | ICD-10-CM | POA: Diagnosis not present

## 2017-10-15 DIAGNOSIS — N401 Enlarged prostate with lower urinary tract symptoms: Secondary | ICD-10-CM | POA: Diagnosis not present

## 2017-10-16 ENCOUNTER — Telehealth: Payer: Self-pay | Admitting: Family Medicine

## 2017-10-16 DIAGNOSIS — I110 Hypertensive heart disease with heart failure: Secondary | ICD-10-CM | POA: Diagnosis not present

## 2017-10-16 DIAGNOSIS — Z48812 Encounter for surgical aftercare following surgery on the circulatory system: Secondary | ICD-10-CM | POA: Diagnosis not present

## 2017-10-16 DIAGNOSIS — Z952 Presence of prosthetic heart valve: Secondary | ICD-10-CM | POA: Diagnosis not present

## 2017-10-16 DIAGNOSIS — I5033 Acute on chronic diastolic (congestive) heart failure: Secondary | ICD-10-CM | POA: Diagnosis not present

## 2017-10-16 DIAGNOSIS — I2510 Atherosclerotic heart disease of native coronary artery without angina pectoris: Secondary | ICD-10-CM | POA: Diagnosis not present

## 2017-10-16 DIAGNOSIS — I482 Chronic atrial fibrillation: Secondary | ICD-10-CM | POA: Diagnosis not present

## 2017-10-16 DIAGNOSIS — I340 Nonrheumatic mitral (valve) insufficiency: Secondary | ICD-10-CM | POA: Diagnosis not present

## 2017-10-16 DIAGNOSIS — N320 Bladder-neck obstruction: Secondary | ICD-10-CM | POA: Diagnosis not present

## 2017-10-16 DIAGNOSIS — N401 Enlarged prostate with lower urinary tract symptoms: Secondary | ICD-10-CM | POA: Diagnosis not present

## 2017-10-16 DIAGNOSIS — Z8673 Personal history of transient ischemic attack (TIA), and cerebral infarction without residual deficits: Secondary | ICD-10-CM | POA: Diagnosis not present

## 2017-10-16 NOTE — Telephone Encounter (Signed)
Jennie from [facility] called requesting Verbal Authorization to make changes in Speech therapy visit.   Patient request  (1) weekly visit instead of multiple visits & wishes to extend treatment for (3) weeks.  Please call [phone] and leave auth on voicemail if necessary.  --glh

## 2017-10-16 NOTE — Telephone Encounter (Signed)
Called and advised that this is approved. MPulliam, CMA/RT(R)

## 2017-10-23 DIAGNOSIS — I2510 Atherosclerotic heart disease of native coronary artery without angina pectoris: Secondary | ICD-10-CM | POA: Diagnosis not present

## 2017-10-23 DIAGNOSIS — I340 Nonrheumatic mitral (valve) insufficiency: Secondary | ICD-10-CM | POA: Diagnosis not present

## 2017-10-23 DIAGNOSIS — I482 Chronic atrial fibrillation: Secondary | ICD-10-CM | POA: Diagnosis not present

## 2017-10-23 DIAGNOSIS — N320 Bladder-neck obstruction: Secondary | ICD-10-CM | POA: Diagnosis not present

## 2017-10-23 DIAGNOSIS — Z952 Presence of prosthetic heart valve: Secondary | ICD-10-CM | POA: Diagnosis not present

## 2017-10-23 DIAGNOSIS — Z48812 Encounter for surgical aftercare following surgery on the circulatory system: Secondary | ICD-10-CM | POA: Diagnosis not present

## 2017-10-23 DIAGNOSIS — Z8673 Personal history of transient ischemic attack (TIA), and cerebral infarction without residual deficits: Secondary | ICD-10-CM | POA: Diagnosis not present

## 2017-10-23 DIAGNOSIS — I110 Hypertensive heart disease with heart failure: Secondary | ICD-10-CM | POA: Diagnosis not present

## 2017-10-23 DIAGNOSIS — N401 Enlarged prostate with lower urinary tract symptoms: Secondary | ICD-10-CM | POA: Diagnosis not present

## 2017-10-23 DIAGNOSIS — I5033 Acute on chronic diastolic (congestive) heart failure: Secondary | ICD-10-CM | POA: Diagnosis not present

## 2017-10-24 ENCOUNTER — Other Ambulatory Visit: Payer: Self-pay

## 2017-10-24 ENCOUNTER — Ambulatory Visit: Payer: Medicare HMO | Admitting: Cardiovascular Disease

## 2017-10-24 ENCOUNTER — Ambulatory Visit (HOSPITAL_COMMUNITY): Payer: Medicare HMO | Attending: Cardiology

## 2017-10-24 ENCOUNTER — Encounter: Payer: Self-pay | Admitting: Cardiovascular Disease

## 2017-10-24 VITALS — BP 112/64 | HR 90 | Ht 71.0 in | Wt 168.0 lb

## 2017-10-24 DIAGNOSIS — I2510 Atherosclerotic heart disease of native coronary artery without angina pectoris: Secondary | ICD-10-CM | POA: Insufficient documentation

## 2017-10-24 DIAGNOSIS — I4891 Unspecified atrial fibrillation: Secondary | ICD-10-CM | POA: Insufficient documentation

## 2017-10-24 DIAGNOSIS — I252 Old myocardial infarction: Secondary | ICD-10-CM | POA: Insufficient documentation

## 2017-10-24 DIAGNOSIS — Z953 Presence of xenogenic heart valve: Secondary | ICD-10-CM | POA: Insufficient documentation

## 2017-10-24 DIAGNOSIS — Z8673 Personal history of transient ischemic attack (TIA), and cerebral infarction without residual deficits: Secondary | ICD-10-CM | POA: Diagnosis not present

## 2017-10-24 DIAGNOSIS — I350 Nonrheumatic aortic (valve) stenosis: Secondary | ICD-10-CM

## 2017-10-24 DIAGNOSIS — I083 Combined rheumatic disorders of mitral, aortic and tricuspid valves: Secondary | ICD-10-CM | POA: Insufficient documentation

## 2017-10-24 DIAGNOSIS — Z952 Presence of prosthetic heart valve: Secondary | ICD-10-CM

## 2017-10-24 DIAGNOSIS — I509 Heart failure, unspecified: Secondary | ICD-10-CM | POA: Insufficient documentation

## 2017-10-24 MED ORDER — CLINDAMYCIN HCL 300 MG PO CAPS: 600.0000 mg | ORAL_CAPSULE | ORAL | 10 refills | Status: DC

## 2017-10-24 NOTE — Progress Notes (Signed)
Cardiology Office Note Date:  10/24/2017   ID:  [name], DOB [birth year], MRN [id number]  PCP:  [name], DO  Cardiologist:  Sherren Mocha, MD    Chief Complaint  Patient presents with  . Follow-up    TAVR     History of Present Illness: Wayne [name] is a 84 y.o. male who presents for 30-day TAVR follow-up after undergoing percutaneous transfemoral TAVR September 04, 2017.  He was treated with a 26 mm Edwards Sapien 3 heart valve.  The patient is here with his wife and daughter today.  His breathing has improved since TAVR.  However, he continues to have generalized fatigue.  He has had problems with short-term memory and he is now participating in speech therapy.  He was hospitalized with viral gastroenteritis the week after TAVR.  He denies chest pain or pressure.  No orthopnea or PND.  He is compliant with his medications.  Past Medical History:  Diagnosis Date  . Aortic stenosis   . Arthritis   . Atrial fibrillation (HCC)    Chronic  . Atrial fibrillation, chronic ([place])   . BPH (benign prostatic hyperplasia)   . CAD (coronary artery disease)   . Chronic anticoagulation   . History of chicken pox   . HTN (hypertension)   . Mitral stenosis   . Old MI (myocardial infarction) 2007   s/p PCI to distal OM (no stent)  . Severe mitral regurgitation   . 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 CORONARY ANGIOPLASTY  2007   Distal OM  . PROSTATE BIOPSY    . RIGHT/LEFT HEART CATH AND CORONARY ANGIOGRAPHY N/A 08/01/2017   Procedure: RIGHT/LEFT HEART CATH AND CORONARY ANGIOGRAPHY;  Surgeon: Sherren Mocha, MD;  Location: [place] [facility];  Service: Cardiovascular;  Laterality: N/A;  . TEE WITHOUT CARDIOVERSION N/A 07/20/2017   Procedure: TRANSESOPHAGEAL ECHOCARDIOGRAM (TEE);  Surgeon: Lelon Perla, MD;  Location: [facility] ENDOSCOPY;  Service: Cardiovascular;  Laterality: N/A;    . TEE WITHOUT CARDIOVERSION N/A 09/04/2017   Procedure: TRANSESOPHAGEAL ECHOCARDIOGRAM (TEE);  Surgeon: Sherren Mocha, MD;  Location: [place];  Service: Open Heart Surgery;  Laterality: N/A;  . TRANSCATHETER AORTIC VALVE REPLACEMENT, TRANSFEMORAL N/A 09/04/2017   Procedure: TRANSCATHETER AORTIC VALVE REPLACEMENT, TRANSFEMORAL;  Surgeon: Sherren Mocha, MD;  Location: [place];  Service: Open Heart Surgery;  Laterality: N/A;    Current Outpatient Medications  Medication Sig Dispense Refill  . acetaminophen (TYLENOL) 500 MG tablet Take 1,000 mg by mouth every 6 (six) hours as needed for moderate pain or headache.    . alfuzosin (UROXATRAL) 10 MG 24 hr tablet Take 10 mg by mouth daily with breakfast.    . ARTIFICIAL TEAR OP Place 1 drop into both eyes daily as needed (dry eyes).    Marland Kitchen aspirin EC 81 MG EC tablet Take 1 tablet (81 mg total) by mouth daily. 30 tablet 2  . cholecalciferol 2000 units TABS Take 1 tablet (2,000 Units total) by mouth daily. 30 tablet 0  . Dextromethorphan-Guaifenesin (MUCINEX DM) 30-600 MG TB12 Take 1-2 tablets by mouth every 12 (twelve) hours as needed (for cough/congestion.).    Marland Kitchen digoxin (LANOXIN) 0.125 MG tablet Take 1 tablet (125 mcg total) by mouth daily. (Patient taking differently: Take 0.0625 mg by mouth daily. ) 90 tablet 3  . feeding supplement, ENSURE ENLIVE, (ENSURE ENLIVE) LIQD Take 237 mLs by mouth 2 (  two) times daily between meals. 237 mL 12  . finasteride (PROSCAR) 5 MG tablet Take 5 mg by mouth at bedtime.     . furosemide (LASIX) 40 MG tablet Take 1 tablet (40 mg total) by mouth daily. 30 tablet 3  . Melatonin 1 MG TABS Take 5 mg by mouth at bedtime.     . Menthol, Topical Analgesic, (BIOFREEZE EX) Apply 1 application topically 3 (three) times daily as needed (muscle pain in back). alterrnates between [employer] and Blu [employer]mu    . Menthol, Topical Analgesic, (BLUE-EMU MAXIMUM STRENGTH EX) Apply 1 application topically 3 (three) times daily as needed (muscle  pain in back). alterrnates between [employer] and [employer]    . methocarbamol (ROBAXIN) 500 MG tablet Take 500 mg by mouth as needed (cramps).     . metoprolol tartrate (LOPRESSOR) 25 MG tablet Take 0.5 tablets (12.5 mg total) by mouth 2 (two) times daily. 30 tablet 6  . potassium chloride (K-DUR) 10 MEQ tablet TAKE 1 TABLET BY MOUTH ONCE DAILY AS NEEDED -TAKE  POTASSIUM  IF  YOU  TAKE  LASIX 90 tablet 3  . potassium chloride (K-DUR,KLOR-CON) 10 MEQ tablet Take 1 tablet (10 mEq total) by mouth daily. 30 tablet 3  . sodium chloride (OCEAN) 0.65 % SOLN nasal spray Place 1 spray into both nostrils daily as needed for congestion.    . triamcinolone cream (KENALOG) 0.1 % Apply 1 application topically daily as needed (dry skin).     Marland Kitchen warfarin (COUMADIN) 2.5 MG tablet Take 2.5 mg on Saturday, Tuesday and Thursday.  Take 5 mg on Sunday, Monday and Wednesday. 30 tablet 0  . clindamycin (CLEOCIN) 300 MG capsule Take 2 capsules (600 mg total) by mouth as directed. Take one hour prior to dental visits. 2 capsule 10   No current facility-administered medications for this visit.     Allergies:   Penicillins; Prednisone; Procaine hcl; Levofloxacin; and Oxycodone   Social History:  The patient  reports that he has never smoked. He has never used smokeless tobacco. He reports that he does not drink alcohol or use drugs.   Family History:  The patient's family history includes Heart attack in his brother, father, mother, and sister; Heart disease in his father and mother; Stroke in his brother.    ROS:  Please see the history of present illness.  Otherwise, review of systems is positive for leg swelling, hearing loss, cough, back pain, muscle pain, easy bruising, leg pain, snoring, anxiety, difficulty urinating, balance problems, headaches.  All other systems are reviewed and negative.    PHYSICAL EXAM: VS:  BP 112/64   Pulse 90   Ht 5\' 11"  (1.803 m)   Wt 168 lb (76.2 kg)   SpO2 98%   BMI 23.43 kg/m  ,  BMI Body mass index is 23.43 kg/m. GEN: Well nourished, well developed, in no acute distress  HEENT: normal  Neck: no JVD, no masses.  Cardiac: Irregularly irregular with 3/6 holosystolic murmur at the apex              Respiratory:  clear to auscultation bilaterally, normal work of breathing GI: soft, nontender, nondistended, + BS MS: no deformity or atrophy  Ext: 1+ bilateral pretibial edema Skin: warm and dry, no rash Neuro:  Strength and sensation are intact Psych: euthymic mood, full affect  EKG:  EKG is not ordered today.  Recent Labs: 05/20/2017: TSH 2.425 08/31/2017: B Natriuretic Peptide 343.3 09/10/2017: ALT 13 09/12/2017: Magnesium  1.8 09/13/2017: Hemoglobin 11.1; Platelets PLATELET CLUMPS NOTED ON SMEAR, UNABLE TO ESTIMATE 10/12/2017: BUN 18; Creatinine, Ser 0.82; Potassium 4.4; Sodium 142   Lipid Panel     Component Value Date/Time   CHOL 176 01/27/2016 0938   TRIG 274 (H) 01/27/2016 0938   HDL 29 (L) 01/27/2016 0938   CHOLHDL 6.1 (H) 01/27/2016 0938   VLDL 55 (H) 01/27/2016 0938   LDLCALC 92 01/27/2016 0938   LDLDIRECT 87.0 05/18/2014 0922      Wt Readings from Last 3 Encounters:  10/24/17 168 lb (76.2 kg)  09/24/17 159 lb (72.1 kg)  09/18/17 167 lb (75.8 kg)     Cardiac Studies Reviewed: Echo (today): Left ventricle:  The cavity size was normal. There was mild concentric hypertrophy. Systolic function was normal. The estimated ejection fraction was in the range of 55% to 60%. Wall motion was normal; there were no regional wall motion abnormalities.  ------------------------------------------------------------------- Aortic valve:  A TAVR Edwards Sapien 3 THV (size 26 mm, model # 9600TFX, serial # [id number] was present. Mobility was not restricted.  Doppler:  Transvalvular velocity was within the normal range. There was no stenosis. There was trivial perivalvular regurgitation.    VTI ratio of LVOT to aortic valve: 0.5. Valve area (VTI):  2.06 cm^2. Indexed valve area (VTI): 1.08 cm^2/m^2. Peak velocity ratio of LVOT to aortic valve: 0.45. Valve area (Vmax): 1.85 cm^2. Indexed valve area (Vmax): 0.97 cm^2/m^2. Mean velocity ratio of LVOT to aortic valve: 0.46. Valve area (Vmean): 1.9 cm^2. Indexed valve area (Vmean): 1 cm^2/m^2.    Mean gradient (S): 6 mm Hg. Peak gradient (S): 14 mm Hg.  ------------------------------------------------------------------- Aorta:  Aortic root: The aortic root was normal in size.  ------------------------------------------------------------------- Mitral valve:   Moderately calcified annulus. Mildly thickened, moderately calcified leaflets . Mobility was not restricted. Moderate prolapse, involving the posterior leaflet, partially flail.  Doppler:   The findings are consistent with moderate stenosis.   There was severe regurgitation directed eccentrically and posteriorly.    Valve area by pressure half-time: 2.12 cm^2. Indexed valve area by pressure half-time: 1.12 cm^2/m^2. Valve area by continuity equation (using LVOT flow): 1.26 cm^2. Indexed valve area by continuity equation (using LVOT flow): 0.66 cm^2/m^2. Mean gradient (D): 9 mm Hg. Peak gradient (D): 20 mm Hg.  ------------------------------------------------------------------- Left atrium:  The atrium was severely dilated.  ------------------------------------------------------------------- Right ventricle:  The cavity size was normal. Wall thickness was normal. Systolic function was normal.  ------------------------------------------------------------------- Pulmonic valve:   Poorly visualized.  Structurally normal valve. Cusp separation was normal.  Doppler:  Transvalvular velocity was within the normal range. There was no evidence for stenosis. There was no regurgitation.  ------------------------------------------------------------------- Tricuspid valve:   Structurally normal valve.    Doppler: Transvalvular  velocity was within the normal range. There was mild regurgitation.  ------------------------------------------------------------------- Pulmonary artery:   The main pulmonary artery was normal-sized. Systolic pressure was mildly increased.  ------------------------------------------------------------------- Right atrium:  The atrium was severely dilated.  ------------------------------------------------------------------- Pericardium:  There was no pericardial effusion.  ------------------------------------------------------------------- Systemic veins: Inferior vena cava: The vessel was normal in size.  CT Scan Abdomen/Pelvis: IMPRESSION: 1. Vascular findings and measurements pertinent to potential TAVR procedure, as detailed above. 2. Severe thickening calcification of the aortic valve, compatible with the reported clinical history of severe aortic stenosis. 3. Aortic atherosclerosis, in addition to left main and 3 vessel coronary artery disease. Evidence of prior left circumflex territory myocardial infarction(s), as above. 4. Severe thickening calcification of the mitral valve and  mitral annulus. 5. Cardiomegaly with severe left atrial dilatation. 6. Moderate bilateral pleural effusions lying dependently are simple in appearance. 7. Large left-sided bladder wall diverticulum with some mild irregularity in the dependent portion of the diverticulum. This could simply represent some retained debris, however, nonemergent Urologic consultation is suggested, particularly if there is any history of hematuria as a urothelial neoplasm in this region is not entirely excluded. 8. Additional incidental findings, as above.  ASSESSMENT AND PLAN: 1.  Severe Aortic Stenosis s/p TAVR: Normal transcatheter heart valve function.  Today's echo is reviewed with normal transvalvular gradients and no significant paravalvular regurgitation.  SBE prophylaxis is reviewed with the  patient.  2.  Severe mitral regurgitation.  The patient has severe primary mitral regurgitation with a flail anterior leaflet segment and mild mitral stenosis.  With significant leaflet thickening and mitral stenosis he is not a candidate for percutaneous edge to edge mitral valve repair with MitraClip, nor is he a candidate for conventional surgery even with a minimally invasive approach.  Continue medical therapy.  3.  Chronic diastolic heart failure, New York Heart Association functional class II symptoms.  Seems to be improved with correction of his severe aortic stenosis.  4.  Permanent atrial fibrillation: Anticoagulated with warfarin.  Heart rate well controlled.  Current medicines are reviewed with the patient today.  The patient does not have concerns regarding medicines.  Labs/ tests ordered today include:   Orders Placed This Encounter  Procedures  . ECHOCARDIOGRAM COMPLETE    Disposition:   FU as scheduled with Dr Stanford Breed. FU one year Valve Clinic with an echo at that time.   Signed, Sherren Mocha, MD  10/24/2017 5:46 PM    [facility] [place], [place], [place]  [address] Phone: [phone]; Fax: [phone]

## 2017-10-24 NOTE — Patient Instructions (Signed)
Medication Instructions:  1) TAKE CLINDAMYCIN 600 mg 1 hour prior to dental visits  Labwork: None  Testing/Procedures: Your provider has requested that you have an echocardiogram in 1 year. Echocardiography is a painless test that uses sound waves to create images of your heart. It provides your doctor with information about the size and shape of your heart and how well your heart's chambers and valves are working. This procedure takes approximately one hour. There are no restrictions for this procedure.  Follow-Up: Your provider wants you to follow-up in: 1 year with Nell Range, PA. You will receive a reminder letter in the mail two months in advance. If you don't receive a letter, please call our office to schedule the follow-up appointment.    Any Other Special Instructions Will Be Listed Below (If Applicable).     If you need a refill on your cardiac medications before your next appointment, please call your pharmacy.

## 2017-10-26 NOTE — Progress Notes (Signed)
Dr. Burt Knack reviewed abnormal CT results with patient. Mr. Coffelt is followed by Dr. Karsten Ro and has an appointment next week. CT results faxed via Epic to Dr. Simone Curia office for review.

## 2017-10-29 DIAGNOSIS — N401 Enlarged prostate with lower urinary tract symptoms: Secondary | ICD-10-CM | POA: Diagnosis not present

## 2017-10-29 DIAGNOSIS — R3912 Poor urinary stream: Secondary | ICD-10-CM | POA: Diagnosis not present

## 2017-10-29 DIAGNOSIS — R3914 Feeling of incomplete bladder emptying: Secondary | ICD-10-CM | POA: Diagnosis not present

## 2017-10-30 ENCOUNTER — Telehealth (HOSPITAL_COMMUNITY): Payer: Self-pay

## 2017-10-30 DIAGNOSIS — N401 Enlarged prostate with lower urinary tract symptoms: Secondary | ICD-10-CM | POA: Diagnosis not present

## 2017-10-30 DIAGNOSIS — Z952 Presence of prosthetic heart valve: Secondary | ICD-10-CM | POA: Diagnosis not present

## 2017-10-30 DIAGNOSIS — I340 Nonrheumatic mitral (valve) insufficiency: Secondary | ICD-10-CM | POA: Diagnosis not present

## 2017-10-30 DIAGNOSIS — Z8673 Personal history of transient ischemic attack (TIA), and cerebral infarction without residual deficits: Secondary | ICD-10-CM | POA: Diagnosis not present

## 2017-10-30 DIAGNOSIS — N320 Bladder-neck obstruction: Secondary | ICD-10-CM | POA: Diagnosis not present

## 2017-10-30 DIAGNOSIS — I110 Hypertensive heart disease with heart failure: Secondary | ICD-10-CM | POA: Diagnosis not present

## 2017-10-30 DIAGNOSIS — Z48812 Encounter for surgical aftercare following surgery on the circulatory system: Secondary | ICD-10-CM | POA: Diagnosis not present

## 2017-10-30 DIAGNOSIS — I482 Chronic atrial fibrillation: Secondary | ICD-10-CM | POA: Diagnosis not present

## 2017-10-30 DIAGNOSIS — I2510 Atherosclerotic heart disease of native coronary artery without angina pectoris: Secondary | ICD-10-CM | POA: Diagnosis not present

## 2017-10-30 DIAGNOSIS — I5033 Acute on chronic diastolic (congestive) heart failure: Secondary | ICD-10-CM | POA: Diagnosis not present

## 2017-10-30 NOTE — Telephone Encounter (Signed)
Called patient to see if he is interested in the Cardiac Rehab Program - Patient stated he believes he is not interested in participating. Closed referral.

## 2017-11-06 DIAGNOSIS — N401 Enlarged prostate with lower urinary tract symptoms: Secondary | ICD-10-CM | POA: Diagnosis not present

## 2017-11-06 DIAGNOSIS — Z48812 Encounter for surgical aftercare following surgery on the circulatory system: Secondary | ICD-10-CM | POA: Diagnosis not present

## 2017-11-06 DIAGNOSIS — Z8673 Personal history of transient ischemic attack (TIA), and cerebral infarction without residual deficits: Secondary | ICD-10-CM | POA: Diagnosis not present

## 2017-11-06 DIAGNOSIS — Z952 Presence of prosthetic heart valve: Secondary | ICD-10-CM | POA: Diagnosis not present

## 2017-11-06 DIAGNOSIS — I2510 Atherosclerotic heart disease of native coronary artery without angina pectoris: Secondary | ICD-10-CM | POA: Diagnosis not present

## 2017-11-06 DIAGNOSIS — N320 Bladder-neck obstruction: Secondary | ICD-10-CM | POA: Diagnosis not present

## 2017-11-06 DIAGNOSIS — I340 Nonrheumatic mitral (valve) insufficiency: Secondary | ICD-10-CM | POA: Diagnosis not present

## 2017-11-06 DIAGNOSIS — I482 Chronic atrial fibrillation: Secondary | ICD-10-CM | POA: Diagnosis not present

## 2017-11-06 DIAGNOSIS — I110 Hypertensive heart disease with heart failure: Secondary | ICD-10-CM | POA: Diagnosis not present

## 2017-11-06 DIAGNOSIS — I5033 Acute on chronic diastolic (congestive) heart failure: Secondary | ICD-10-CM | POA: Diagnosis not present

## 2017-11-08 ENCOUNTER — Ambulatory Visit: Payer: Medicare HMO | Admitting: Cardiology

## 2017-11-13 DIAGNOSIS — R69 Illness, unspecified: Secondary | ICD-10-CM | POA: Diagnosis not present

## 2017-11-20 ENCOUNTER — Ambulatory Visit (INDEPENDENT_AMBULATORY_CARE_PROVIDER_SITE_OTHER): Payer: Medicare HMO | Admitting: Pharmacist

## 2017-11-20 DIAGNOSIS — I482 Chronic atrial fibrillation, unspecified: Secondary | ICD-10-CM

## 2017-11-20 DIAGNOSIS — Z5181 Encounter for therapeutic drug level monitoring: Secondary | ICD-10-CM

## 2017-11-20 LAB — POCT INR: INR: 1.7 — AB (ref 2.0–3.0)

## 2017-11-21 ENCOUNTER — Encounter: Payer: Self-pay | Admitting: Thoracic Surgery (Cardiothoracic Vascular Surgery)

## 2017-11-26 DIAGNOSIS — N323 Diverticulum of bladder: Secondary | ICD-10-CM | POA: Diagnosis not present

## 2017-11-26 DIAGNOSIS — N401 Enlarged prostate with lower urinary tract symptoms: Secondary | ICD-10-CM | POA: Diagnosis not present

## 2017-11-26 DIAGNOSIS — R3914 Feeling of incomplete bladder emptying: Secondary | ICD-10-CM | POA: Diagnosis not present

## 2017-11-26 DIAGNOSIS — N281 Cyst of kidney, acquired: Secondary | ICD-10-CM | POA: Diagnosis not present

## 2017-11-26 DIAGNOSIS — R351 Nocturia: Secondary | ICD-10-CM | POA: Diagnosis not present

## 2017-12-04 ENCOUNTER — Ambulatory Visit (INDEPENDENT_AMBULATORY_CARE_PROVIDER_SITE_OTHER): Payer: Medicare HMO | Admitting: Family Medicine

## 2017-12-04 ENCOUNTER — Encounter: Payer: Self-pay | Admitting: Family Medicine

## 2017-12-04 ENCOUNTER — Other Ambulatory Visit: Payer: Self-pay | Admitting: Cardiology

## 2017-12-04 VITALS — BP 110/67 | HR 81 | Ht 71.0 in | Wt 173.0 lb

## 2017-12-04 DIAGNOSIS — Z79899 Other long term (current) drug therapy: Secondary | ICD-10-CM

## 2017-12-04 DIAGNOSIS — M1612 Unilateral primary osteoarthritis, left hip: Secondary | ICD-10-CM | POA: Diagnosis not present

## 2017-12-04 DIAGNOSIS — I482 Chronic atrial fibrillation, unspecified: Secondary | ICD-10-CM

## 2017-12-04 DIAGNOSIS — G4700 Insomnia, unspecified: Secondary | ICD-10-CM

## 2017-12-04 DIAGNOSIS — E559 Vitamin D deficiency, unspecified: Secondary | ICD-10-CM

## 2017-12-04 NOTE — Progress Notes (Signed)
Impression and Recommendations:    1. Insomnia, unspecified type   2. Primary osteoarthritis of left hip   3. High risk medication use   4. Chronic atrial fibrillation (HCC)   5. Vitamin D deficiency     1. Insomnia -discussed frequency of urination and him not getting REM sleep. He discussed with neurologist who states the only way to treat this it to catheterize him, to which he declines. -continue melatonin, to which pt is happy with.   2. Primary OA of L hip -sx stable. -Continue using your walker to assist you in getting around.  3. High risk medication use -Pt is on coumadin and understands he is at high risk of fall with life-threatening consequences. Pt understands it is very important to not take anything that alters his level of consciousness.   4. Chronic afib -Pt's cardiologist, Dr. Stanford Breed, follows him closely. Stable at this time.  -Fup with cardiologist later this month as recommended by them.   5. Vit D deficiency -continue supplementation   No orders of the defined types were placed in this encounter.   No orders of the defined types were placed in this encounter.   Gross side effects, risk and benefits, and alternatives of medications and treatment plan in general discussed with patient.  Patient is aware that all medications have potential side effects and we are unable to predict every side effect or drug-drug interaction that may occur.   Patient will call with any questions prior to using medication if they have concerns.  Expresses verbal understanding and consents to current therapy and treatment regimen.  No barriers to understanding were identified.  Red flag symptoms and signs discussed in detail.  Patient expressed understanding regarding what to do in case of emergency\urgent symptoms  Please see AVS handed out to patient at the end of our visit for further patient instructions/ counseling done pertaining to today's office visit.   Return  for 37mo or so; sooner if problems/ concerns.    Note: This note was prepared with assistance of Dragon voice recognition software. Occasional wrong-word or sound-a-like substitutions may have occurred due to the inherent limitations of voice recognition software.  This document serves as a record of services personally performed by [name], DO. It was created on her behalf by Mayer Masker, a trained medical scribe. The creation of this record is based on the scribe's personal observations and the provider's statements to them.   I have reviewed the above medical documentation for accuracy and completeness and I concur.  [name] 12/04/17 4:58 PM   --------------------------------------------------------------------------------------------------------------------------------------------------------------------------------------------------------------------------------------------    Subjective:     HPI: Wayne [name] is a 84 y.o. male who presents to [place] [facility] today for issues as discussed below.  Mood Sleep is somewhat improved. He is taking 5 mg melatonin qhs and he sleeps better, except for nocturia that is q hourly when he is sleeping. He has decreased energy when he gets up in the morning. He is napping throughout the day because "it is necessary" since he has limited sleep at night and is tired.   The nocturia issue is hindering his quality of life.   Memory He has problems with his short term memory.   Urology Pt saw his urologist, Dr. Karsten Ro, last week. They found a spot on his bladder that was determined to not be cancerous. He is taking lasix BID. He declines any semi permanent catheterization or any  further workup/medication management to alleviate his nocturia symptoms.   Heart No new CP, SOB, difficulties in lying flat, unexplained wt gain or loss, new onset of leg swelling, dizziness, lightheadedness etc. No  problem with BP.   Driving He is doing well and has been able to drive a car. He feels his driving ability is adequate and safe. His wife always drives with him.   He is "just barely" getting around the house- he has 2 walkers he uses.    Depression screen [facility] 2/9 12/04/2017 09/24/2017 07/10/2017 06/14/2017  Decreased Interest 0 2 0 1  Down, Depressed, Hopeless 0 0 0 0  PHQ - 2 Score 0 2 0 1  Altered sleeping 0 2 0 1  Tired, decreased energy 3 3 0 3  Change in appetite 0 0 0 0  Feeling bad or failure about yourself  0 0 0 -  Trouble concentrating 0 1 0 1  Moving slowly or fidgety/restless 0 3 0 3  Suicidal thoughts 0 0 0 0  PHQ-9 Score 3 11 0 9  Difficult doing work/chores - Somewhat difficult Not difficult at all Somewhat difficult   No flowsheet data found.     Wt Readings from Last 3 Encounters:  12/04/17 173 lb (78.5 kg)  10/24/17 168 lb (76.2 kg)  09/24/17 159 lb (72.1 kg)   BP Readings from Last 3 Encounters:  12/04/17 110/67  10/24/17 112/64  09/24/17 119/76   Pulse Readings from Last 3 Encounters:  12/04/17 81  10/24/17 90  09/24/17 84   BMI Readings from Last 3 Encounters:  12/04/17 24.13 kg/m  10/24/17 23.43 kg/m  09/24/17 22.18 kg/m     Patient Care Team    Relationship Specialty Notifications Start End  [name], DO PCP - General Family Medicine  10/23/16   Lelon Perla, MD PCP - Cardiology Cardiology Admissions 07/06/17   Jarome Matin, MD Consulting Physician Dermatology  10/23/16   Kathie Rhodes, MD Consulting Physician Urology  10/23/16   Latanya Maudlin, MD Consulting Physician Orthopedic Surgery  11/16/16    Comment: back pain     Patient Active Problem List   Diagnosis Date Noted  . Vitamin D deficiency 11/16/2016    Priority: High  . H/O non anemic vitamin B12 deficiency 11/16/2016    Priority: High  . Mild Colonic dysmotility 11/16/2016    Priority: High  . Chronic atrial fibrillation ([place]) 09/13/2010    Priority: High  .  Chronic anticoagulation     Priority: Medium  . HTN (hypertension)     Priority: Medium  . CAD (coronary artery disease)     Priority: Medium  . h/o CVA (cerebrovascular accident due to intracerebral hemorrhage) 09/13/2010    Priority: Medium  . BPH with obstruction/lower urinary tract symptoms 09/13/2016    Priority: Low  . Osteoarthritis of hip 09/24/2017  . Gastroenteritis 09/10/2017  . S/P TAVR (transcatheter aortic valve replacement) 09/04/2017  . Severe mitral regurgitation   . Insomnia 07/10/2017  . High risk medication use 07/10/2017  . Long term current use of anticoagulant 07/10/2017  . Chronic diastolic CHF (congestive heart failure) ([place]) 05/20/2017  . Hyponatremia 05/20/2017  . Severe aortic stenosis 05/20/2017  . Urinary retention   . 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 Spinal stenosis at L4-L5 level 04/26/2017  . Lumbar radiculopathy, chronic 04/26/2017  . Benign colonic polyp- 25 yrs ago.  11/16/2016  . Hypokalemia 09/13/2016  . Urinary frequency 07/10/2016  . Left  bundle branch block (LBBB) on electrocardiogram 06/30/2016  . History of stroke 06/30/2016  . Generalized weakness 06/30/2016  . Osteoarthritis 04/04/2016  . Encounter for therapeutic drug monitoring 08/06/2013    Past Medical history, Surgical history, Family history, Social history, Allergies and Medications have been entered into the medical record, reviewed and changed as needed.    Current Meds  Medication Sig  . acetaminophen (TYLENOL) 500 MG tablet Take 1,000 mg by mouth every 6 (six) hours as needed for moderate pain or headache.  . alfuzosin (UROXATRAL) 10 MG 24 hr tablet Take 10 mg by mouth daily with breakfast.  . ARTIFICIAL TEAR OP Place 1 drop into both eyes daily as needed (dry eyes).  Marland Kitchen aspirin EC 81 MG EC tablet Take 1 tablet (81 mg total) by mouth daily.  . cholecalciferol 2000 units TABS Take 1 tablet (2,000 Units total) by mouth daily.  .  clindamycin (CLEOCIN) 300 MG capsule Take 2 capsules (600 mg total) by mouth as directed. Take one hour prior to dental visits.  . Dextromethorphan-Guaifenesin (MUCINEX DM) 30-600 MG TB12 Take 1-2 tablets by mouth every 12 (twelve) hours as needed (for cough/congestion.).  Marland Kitchen digoxin (LANOXIN) 0.125 MG tablet Take 1 tablet (125 mcg total) by mouth daily. (Patient taking differently: Take 0.0625 mg by mouth daily. )  . finasteride (PROSCAR) 5 MG tablet Take 5 mg by mouth at bedtime.   . furosemide (LASIX) 40 MG tablet Take 1 tablet (40 mg total) by mouth daily.  . Melatonin 1 MG TABS Take 5 mg by mouth at bedtime.   . Menthol, Topical Analgesic, (BIOFREEZE EX) Apply 1 application topically 3 (three) times daily as needed (muscle pain in back). alterrnates between [employer] and Blu [employer]mu  . Menthol, Topical Analgesic, (BLUE-EMU MAXIMUM STRENGTH EX) Apply 1 application topically 3 (three) times daily as needed (muscle pain in back). alterrnates between [employer] and [employer]mu  . methocarbamol (ROBAXIN) 500 MG tablet Take 500 mg by mouth as needed (cramps).   . metoprolol tartrate (LOPRESSOR) 25 MG tablet Take 0.5 tablets (12.5 mg total) by mouth 2 (two) times daily.  . potassium chloride (K-DUR,KLOR-CON) 10 MEQ tablet Take 1 tablet (10 mEq total) by mouth daily.  . sodium chloride (OCEAN) 0.65 % SOLN nasal spray Place 1 spray into both nostrils daily as needed for congestion.  . triamcinolone cream (KENALOG) 0.1 % Apply 1 application topically daily as needed (dry skin).   Marland Kitchen warfarin (COUMADIN) 2.5 MG tablet Take 2.5 mg on Saturday, Tuesday and Thursday.  Take 5 mg on Sunday, Monday and Wednesday. (Patient taking differently: Take 2.5 mg on Saturday, Tuesday and Thursday.  Take 5 mg on Sunday, Monday and Wednesday and Friday.)    Allergies:  Allergies  Allergen Reactions  . Penicillins Rash and Other (See Comments)    PATIENT HAS HAD A PCN REACTION WITH IMMEDIATE RASH, FACIAL/TONGUE/THROAT SWELLING,  SOB, OR LIGHTHEADEDNESS WITH HYPOTENSION:  #  #  #  YES  #  #  #   Has patient had a PCN reaction causing severe rash involving mucus membranes or skin necrosis: No Has patient had a PCN reaction that required hospitalization No Has patient had a PCN reaction occurring within the last 10 years: No If all of the above answers are "NO", then may proceed with Cephalosporin use.  . Prednisone Other (See Comments)    Wheezing  . Procaine Hcl Other (See Comments)    Passed out after being given this at dental appointment  .  Levofloxacin Rash  . Oxycodone Other (See Comments)    constipation     Review of Systems:  A fourteen system review of systems was performed and found to be positive as per HPI.   Objective:   Blood pressure 110/67, pulse 81, height 5\' 11"  (1.803 m), weight 173 lb (78.5 kg), SpO2 97 %. Body mass index is 24.13 kg/m. General:  Well Developed, well nourished, appropriate for stated age.  Neuro:  Alert and oriented,  extra-ocular muscles intact  HEENT:  Normocephalic, atraumatic, neck supple, no carotid bruits appreciated  Skin:  no gross rash, warm, pink. Cardiac:  Irregularly irregular rhythm, regular rate, 3/6 L systolic ejection murmur. S1 S2.   Respiratory:  ECTA B/L and A/P, Not using accessory muscles, speaking in full sentences- unlabored. Vascular:  Ext warm, no cyanosis apprec.; cap RF less 2 sec. Psych:  No HI/SI, judgement and insight good, Euthymic mood. Full Affect.

## 2017-12-15 ENCOUNTER — Ambulatory Visit (INDEPENDENT_AMBULATORY_CARE_PROVIDER_SITE_OTHER): Payer: Medicare HMO

## 2017-12-15 ENCOUNTER — Encounter (HOSPITAL_COMMUNITY): Payer: Self-pay

## 2017-12-15 ENCOUNTER — Ambulatory Visit (HOSPITAL_COMMUNITY)
Admission: EM | Admit: 2017-12-15 | Discharge: 2017-12-15 | Disposition: A | Discharge status: Home / Self Care | Payer: Medicare HMO | Attending: Family Medicine | Admitting: Family Medicine

## 2017-12-15 DIAGNOSIS — K5900 Constipation, unspecified: Secondary | ICD-10-CM

## 2017-12-15 DIAGNOSIS — R1032 Left lower quadrant pain: Secondary | ICD-10-CM | POA: Diagnosis not present

## 2017-12-15 LAB — POCT URINALYSIS DIP (DEVICE)
Bilirubin Urine: NEGATIVE
GLUCOSE, UA: NEGATIVE mg/dL
Hgb urine dipstick: NEGATIVE
Ketones, ur: NEGATIVE mg/dL
Leukocytes, UA: NEGATIVE
NITRITE: NEGATIVE
PROTEIN: NEGATIVE mg/dL
Specific Gravity, Urine: 1.025 (ref 1.005–1.030)
Urobilinogen, UA: 0.2 mg/dL (ref 0.0–1.0)
pH: 6 (ref 5.0–8.0)

## 2017-12-15 NOTE — ED Provider Notes (Signed)
[place]   [id number] 12/15/17 Arrival Time: 1601   SUBJECTIVE:  Wayne [name] is a 84 y.o. male who presents to the urgent care with complaint of left lower quadrant pain for 2 days.  He tried calling his primary care doctor yesterday and today but there was no answer.  Patient states that the left lower quadrant pain began insidiously without any injury or heavy lifting.  The pain is fairly constant but gets worse when he reaches across a table reach for something.  He said no fever.  He did have a small bowel movement this morning.  Patient has had atrial fibrillation, heart valve replacement, and a stroke in the past.  Following his heart valve replacement, patient had 6 days of diarrhea delaying his discharge.  Patient is currently on warfarin  Patient takes MiraLAX every night.  There is been no change in appetite, nausea, vomiting Past Medical History:  Diagnosis Date  . Aortic stenosis   . Arthritis   . Atrial fibrillation (HCC)    Chronic  . Atrial fibrillation, chronic ([place])   . BPH (benign prostatic hyperplasia)   . CAD (coronary artery disease)   . Chronic anticoagulation   . History of chicken pox   . HTN (hypertension)   . Mitral stenosis   . Old MI (myocardial infarction) 2007   s/p PCI to distal OM (no stent)  . Severe mitral regurgitation   . Stroke, embolic [facility])    0932-3 weeks after his heart attack   Family History  Problem Relation Age of Onset  . Heart disease Mother   . Heart attack Mother   . Heart disease Father   . Heart attack Father   . Heart attack Brother   . Heart attack Sister   . Stroke Brother    Social History   Socioeconomic History  . Marital status: Married    Spouse name: Not on file  . Number of children: 3  . Years of education: 41  . Highest education level: Not on file  Occupational History  . Not on file  Social Needs  . Financial resource strain: Not on file  . Food insecurity:    Worry:  Not on file    Inability: Not on file  . Transportation needs:    Medical: Not on file    Non-medical: Not on file  Tobacco Use  . Smoking status: Never Smoker  . Smokeless tobacco: Never Used  Substance and Sexual Activity  . Alcohol use: No  . Drug use: No  . Sexual activity: Not on file  Lifestyle  . Physical activity:    Days per week: Not on file    Minutes per session: Not on file  . Stress: Not on file  Relationships  . Social connections:    Talks on phone: Not on file    Gets together: Not on file    Attends religious service: Not on file    Active member of club or organization: Not on file    Attends meetings of clubs or organizations: Not on file    Relationship status: Not on file  . Intimate partner violence:    Fear of current or ex partner: Not on file    Emotionally abused: Not on file    Physically abused: Not on file    Forced sexual activity: Not on file  Other Topics Concern  . Not on file  Social History Narrative   Fun: Garden, work in the  yard, anything physical and walk daily   No outpatient medications have been marked as taking for the 12/15/17 encounter [facility] Encounter).   Allergies  Allergen Reactions  . Penicillins Rash and Other (See Comments)    PATIENT HAS HAD A PCN REACTION WITH IMMEDIATE RASH, FACIAL/TONGUE/THROAT SWELLING, SOB, OR LIGHTHEADEDNESS WITH HYPOTENSION:  #  #  #  YES  #  #  #   Has patient had a PCN reaction causing severe rash involving mucus membranes or skin necrosis: No Has patient had a PCN reaction that required hospitalization No Has patient had a PCN reaction occurring within the last 10 years: No If all of the above answers are "NO", then may proceed with Cephalosporin use.  . Prednisone Other (See Comments)    Wheezing  . Procaine Hcl Other (See Comments)    Passed out after being given this at dental appointment  . Levofloxacin Rash  . Oxycodone Other (See Comments)    constipation      ROS: As per  HPI, remainder of ROS negative.   OBJECTIVE:   Vitals:   12/15/17 1459  BP: (!) 118/59  Pulse: 86  Resp: 19  Temp: 97.7 F (36.5 C)  TempSrc: Oral  SpO2: 100%     General appearance: alert; no distress Eyes: PERRL; EOMI; conjunctiva normal HENT: normocephalic; atraumatic; ; oral mucosa normal Neck: supple Lungs: clear to auscultation bilaterally Heart:  irregular rate and rhythm, 2/6 systolic murmur best heard at the right sternal border Abdomen: soft, non-tender; bowel sounds normal; no masses or organomegaly; no guarding or rebound tenderness Back: no CVA tenderness Extremities: moderated bilateral ankle edema;  symmetrical with no gross deformities Skin: warm and dry; multiple ecchymoses on forearms. Neurologic: normal gait; grossly normal Psychological: alert and cooperative; normal mood and affect      Labs:  Results for orders placed or performed during the hospital encounter of 12/15/17  POCT urinalysis dip (device)  Result Value Ref Range   Glucose, UA NEGATIVE NEGATIVE mg/dL   Bilirubin Urine NEGATIVE NEGATIVE   Ketones, ur NEGATIVE NEGATIVE mg/dL   Specific Gravity, Urine 1.025 1.005 - 1.030   Hgb urine dipstick NEGATIVE NEGATIVE   pH 6.0 5.0 - 8.0   Protein, ur NEGATIVE NEGATIVE mg/dL   Urobilinogen, UA 0.2 0.0 - 1.0 mg/dL   Nitrite NEGATIVE NEGATIVE   Leukocytes, UA NEGATIVE NEGATIVE    Labs Reviewed  POCT URINALYSIS DIP (DEVICE)    No results found.     ASSESSMENT & PLAN:  1. Constipation, unspecified constipation type   I want you to take an extra 2 doses of MiraLAX today.  That means taking 1 dose when you get home, repeating that in 2 hours, and then taking a third dose at bedtime.  If your pain worsens or fails to improve in the next 24 hours, please return for evaluation.  No orders of the defined types were placed in this encounter. The physical exam is very normal and there is a large amount of stool in the left colon that would  account for patient's symptoms.  Reviewed expectations re: course of current medical issues. Questions answered. Outlined signs and symptoms indicating need for more acute intervention. Patient verbalized understanding. After Visit Summary given.    Procedures:      Robyn Haber, MD 12/15/17 1547

## 2017-12-15 NOTE — ED Triage Notes (Signed)
Pt presents with ongoing  left lower abdominal pain

## 2017-12-15 NOTE — Discharge Instructions (Addendum)
I want you to take an extra 2 doses of MiraLAX today.  That means taking 1 dose when you get home, repeating that in 2 hours, and then taking a third dose at bedtime.  If your pain worsens or fails to improve in the next 24 hours, please return for evaluation.

## 2017-12-19 ENCOUNTER — Encounter: Payer: Self-pay | Admitting: Family Medicine

## 2017-12-19 ENCOUNTER — Ambulatory Visit (INDEPENDENT_AMBULATORY_CARE_PROVIDER_SITE_OTHER): Payer: Medicare HMO | Admitting: Family Medicine

## 2017-12-19 VITALS — BP 116/72 | HR 92 | Ht 71.0 in | Wt 172.0 lb

## 2017-12-19 DIAGNOSIS — K599 Functional intestinal disorder, unspecified: Secondary | ICD-10-CM

## 2017-12-19 DIAGNOSIS — K439 Ventral hernia without obstruction or gangrene: Secondary | ICD-10-CM | POA: Diagnosis not present

## 2017-12-19 DIAGNOSIS — K5900 Constipation, unspecified: Secondary | ICD-10-CM | POA: Insufficient documentation

## 2017-12-19 NOTE — Progress Notes (Signed)
Pt here for an acute care OV today   Impression and Recommendations:    1. Constipation, unspecified constipation type   2.  hernia of linea alba   3. Chronic Colonic dysmotility     1. Constipation - Discussed that gas and bloating, along with stool impaction, can cause the type of discomfort the patient describes.  - Recommended application of Fleet enema at home, to soften contents of the bowel.  - Patient with no symptoms of diverticulitis, no cholecystitis.  Patient has no new hernia.  - Continue to use stool softeners.  Very small dose of magnesium citrate recommended, along with Miralax.  - Please drink more water!  Encouraged the patient to drink at least 100 oz of water per day.  - Move around more to increase blood flow to the gut and stimulate movement.  - Drink warm liquids such as hot tea and hot chocolate.  2. Follow-Up - Return as recommended for chronic follow-up.   Education and routine counseling performed. Handouts provided  Gross side effects, risk and benefits, and alternatives of medications and treatment plan in general discussed with patient.  Patient is aware that all medications have potential side effects and we are unable to predict every side effect or drug-drug interaction that may occur.   Patient will call with any questions prior to using medication if they have concerns.  Expresses verbal understanding and consents to current therapy and treatment regimen.  No barriers to understanding were identified.  Red flag symptoms and signs discussed in detail.  Patient expressed understanding regarding what to do in case of emergency\urgent symptoms   Please see AVS handed out to patient at the end of our visit for further patient instructions/ counseling done pertaining to today's office visit.   Return if symptoms worsen or fail to improve.     Note: This document was prepared occasionally using Dragon voice recognition software and may  include unintentional dictation errors in addition to a scribe.  This document serves as a record of services personally performed by [name], DO. It was created on her behalf by Toni Amend, a trained medical scribe. The creation of this record is based on the scribe's personal observations and the provider's statements to them.   I have reviewed the above medical documentation for accuracy and completeness and I concur.  [name] 01/02/18 6:08 PM   ------------------------------------------------------------------------------------------------------------------------------------------   Subjective:    CC:  Chief Complaint  Patient presents with  . Abdominal Pain    HPI: Wayne [name] is a 84 y.o. male who presents to [place] [facility] today for issues as discussed below.  Continued Abdominal Pain Patient states he had pain in his left side starting last week, "any movement bothered it a great deal." Went to the ED on 12/15/2017; doctor ruled out diverticulitis, did a urine specimen which was clear. They were told that the patient had constipation in the colon.  Were told to go home and take Miralax in a higher dose.  Patient was seen in the ED for left lower quadrant abdominal pain.  States he got no result from following the increased Miralax. On his own, patient increased his dose of Miralax to 200% the next day. Notes that this didn't help much either.  Wife states that he's been having small bowel movements, but he's not eating a lot. Patient states that he has not had a normal bowel movement in at least a week.  Patient states that his pain is unchanged.  Wife reports that he looks a little more comfortable.  Pain is still located in his lower belly.  He says he eats "really healthy," and states that he drinks water all the time.  He isn't sure how much water he is drinking.  However, per wife, his Lasix dose has  been increased, which would explain why he would be more "dried out."  Patient's wife purchased him some prune juice, but he refused to drink more than 3 ounces.   SUBJECTIVE FROM ED VISIT 12/15/2017: SUBJECTIVE:  Wayne [name] is a 84 y.o. male who presents to the urgent care with complaint of left lower quadrant pain for 2 days.  He tried calling his primary care doctor yesterday and today but there was no answer.  Patient states that the left lower quadrant pain began insidiously without any injury or heavy lifting.  The pain is fairly constant but gets worse when he reaches across a table reach for something.  He said no fever.  He did have a small bowel movement this morning.  Patient has had atrial fibrillation, heart valve replacement, and a stroke in the past.  Following his heart valve replacement, patient had 6 days of diarrhea delaying his discharge.  Patient is currently on warfarin  Patient takes MiraLAX every night.  There is been no change in appetite, nausea, vomiting  ASSESSMENT & PLAN FROM ED VISIT  1. Constipation, unspecified constipation type   I want you to take an extra 2 doses of MiraLAX today.  That means taking 1 dose when you get home, repeating that in 2 hours, and then taking a third dose at bedtime.  If your pain worsens or fails to improve in the next 24 hours, please return for evaluation.  No orders of the defined types were placed in this encounter. The physical exam is very normal and there is a large amount of stool in the left colon that would account for patient's symptoms.  Reviewed expectations re: course of current medical issues. Questions answered. Outlined signs and symptoms indicating need for more acute intervention. Patient verbalized understanding. After Visit Summary given.   No problems updated.   Wt Readings from Last 3 Encounters:  12/31/17 176 lb (79.8 kg)  12/19/17 172 lb (78 kg)  12/04/17 173 lb (78.5  kg)   BP Readings from Last 3 Encounters:  12/31/17 108/63  12/19/17 116/72  12/15/17 (!) 118/59   BMI Readings from Last 3 Encounters:  12/31/17 24.90 kg/m  12/19/17 23.99 kg/m  12/04/17 24.13 kg/m     Patient Care Team    Relationship Specialty Notifications Start End  [name], DO PCP - General Family Medicine  10/23/16   Lelon Perla, MD PCP - Cardiology Cardiology Admissions 07/06/17   Jarome Matin, MD Consulting Physician Dermatology  10/23/16   Kathie Rhodes, MD Consulting Physician Urology  10/23/16   Latanya Maudlin, MD Consulting Physician Orthopedic Surgery  11/16/16    Comment: back pain     Patient Active Problem List   Diagnosis Date Noted  . Vitamin D deficiency 11/16/2016    Priority: High  . H/O non anemic vitamin B12 deficiency 11/16/2016    Priority: High  . Chronic Colonic dysmotility 11/16/2016    Priority: High  . Chronic atrial fibrillation ([place]) 09/13/2010    Priority: High  . Chronic anticoagulation     Priority: Medium  . HTN (hypertension)     Priority: Medium  .  CAD (coronary artery disease)     Priority: Medium  . h/o CVA (cerebrovascular accident due to intracerebral hemorrhage) 09/13/2010    Priority: Medium  . BPH with obstruction/lower urinary tract symptoms 09/13/2016    Priority: Low  . Constipation 12/19/2017  .  hernia of linea alba 12/19/2017  . Osteoarthritis of hip 09/24/2017  . Gastroenteritis 09/10/2017  . S/P TAVR (transcatheter aortic valve replacement) 09/04/2017  . Severe mitral regurgitation   . Insomnia 07/10/2017  . High risk medication use 07/10/2017  . Long term current use of anticoagulant 07/10/2017  . Chronic diastolic CHF (congestive heart failure) ([place]) 05/20/2017  . Hyponatremia 05/20/2017  . Severe aortic stenosis 05/20/2017  . Urinary retention   . 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 Spinal stenosis at L4-L5 level 04/26/2017  . Lumbar radiculopathy, chronic  04/26/2017  . Benign colonic polyp- 25 yrs ago.  11/16/2016  . Hypokalemia 09/13/2016  . Urinary frequency 07/10/2016  . Left bundle branch block (LBBB) on electrocardiogram 06/30/2016  . History of stroke 06/30/2016  . Generalized weakness 06/30/2016  . Osteoarthritis 04/04/2016  . Encounter for therapeutic drug monitoring 08/06/2013    Past Medical history, Surgical history, Family history, Social history, Allergies and Medications have been entered into the medical record, reviewed and changed as needed.    Current Meds  Medication Sig  . acetaminophen (TYLENOL) 500 MG tablet Take 1,000 mg by mouth every 6 (six) hours as needed for moderate pain or headache.  . alfuzosin (UROXATRAL) 10 MG 24 hr tablet Take 10 mg by mouth daily with breakfast.  . ARTIFICIAL TEAR OP Place 1 drop into both eyes daily as needed (dry eyes).  . cholecalciferol 2000 units TABS Take 1 tablet (2,000 Units total) by mouth daily.  . clindamycin (CLEOCIN) 300 MG capsule Take 2 capsules (600 mg total) by mouth as directed. Take one hour prior to dental visits.  . Dextromethorphan-Guaifenesin (MUCINEX DM) 30-600 MG TB12 Take 1-2 tablets by mouth every 12 (twelve) hours as needed (for cough/congestion.).  Marland Kitchen digoxin (LANOXIN) 0.125 MG tablet Take 1 tablet (125 mcg total) by mouth daily. (Patient taking differently: Take 0.0625 mg by mouth daily. )  . finasteride (PROSCAR) 5 MG tablet Take 5 mg by mouth at bedtime.   . furosemide (LASIX) 40 MG tablet Take 1 tablet (40 mg total) by mouth daily.  . Melatonin 1 MG TABS Take 5 mg by mouth at bedtime.   . Menthol, Topical Analgesic, (BIOFREEZE EX) Apply 1 application topically 3 (three) times daily as needed (muscle pain in back). alterrnates between [employer] and Blu Emu  . Menthol, Topical Analgesic, (BLUE-EMU MAXIMUM STRENGTH EX) Apply 1 application topically 3 (three) times daily as needed (muscle pain in back). alterrnates between [employer] and [employer]mu  .  methocarbamol (ROBAXIN) 500 MG tablet Take 500 mg by mouth as needed (cramps).   . metoprolol tartrate (LOPRESSOR) 25 MG tablet Take 0.5 tablets (12.5 mg total) by mouth 2 (two) times daily.  . potassium chloride (K-DUR,KLOR-CON) 10 MEQ tablet Take 1 tablet (10 mEq total) by mouth daily.  . sodium chloride (OCEAN) 0.65 % SOLN nasal spray Place 1 spray into both nostrils daily as needed for congestion.  . triamcinolone cream (KENALOG) 0.1 % Apply 1 application topically daily as needed (dry skin).   Marland Kitchen warfarin (COUMADIN) 2.5 MG tablet Take 2.5 mg on Saturday, Tuesday and Thursday.  Take 5 mg on Sunday, Monday and Wednesday. (Patient taking differently: Take  2.5 mg on Saturday, Tuesday and Thursday.  Take 5 mg on Sunday, Monday and Wednesday and Friday.)    Allergies:  Allergies  Allergen Reactions  . Penicillins Rash and Other (See Comments)    PATIENT HAS HAD A PCN REACTION WITH IMMEDIATE RASH, FACIAL/TONGUE/THROAT SWELLING, SOB, OR LIGHTHEADEDNESS WITH HYPOTENSION:  #  #  #  YES  #  #  #   Has patient had a PCN reaction causing severe rash involving mucus membranes or skin necrosis: No Has patient had a PCN reaction that required hospitalization No Has patient had a PCN reaction occurring within the last 10 years: No If all of the above answers are "NO", then may proceed with Cephalosporin use.  . Prednisone Other (See Comments)    Wheezing  . Procaine Hcl Other (See Comments)    Passed out after being given this at dental appointment  . Levofloxacin Rash  . Oxycodone Other (See Comments)    constipation     Review of Systems: General:   Denies fever, chills, unexplained weight loss.  Optho/Auditory:   Denies visual changes, blurred vision/LOV Respiratory:   Denies wheeze, DOE more than baseline levels.  Cardiovascular:   Denies chest pain, palpitations, new onset peripheral edema  Gastrointestinal:   Denies nausea, vomiting, diarrhea, abd pain.  Genitourinary: Denies dysuria,  freq/ urgency, flank pain or discharge from genitals.  Endocrine:     Denies hot or cold intolerance, polyuria, polydipsia. Musculoskeletal:   Denies unexplained myalgias, joint swelling, unexplained arthralgias, gait problems.  Skin:  Denies new onset rash, suspicious lesions Neurological:     Denies dizziness, unexplained weakness, numbness  Psychiatric/Behavioral:   Denies mood changes, suicidal or homicidal ideations, hallucinations    Objective:   Blood pressure 116/72, pulse 92, height 5\' 11"  (1.803 m), weight 172 lb (78 kg), SpO2 97 %. Body mass index is 23.99 kg/m. General:  Well Developed, well nourished, appropriate for stated age.  Neuro:  Alert and oriented,  extra-ocular muscles intact  HEENT:  Normocephalic, atraumatic, neck supple Skin:  no gross rash, warm, pink. Cardiac:  RRR, S1 S2 Respiratory:  ECTA B/L and A/P, Not using accessory muscles, speaking in full sentences- unlabored. Vascular:  Ext warm, no cyanosis apprec.; cap RF less 2 sec. Psych:  No HI/SI, judgement and insight good, Euthymic mood. Full Affect. Abdominal:  Periumbilical hernia along linea alba of the stomach approximately 10 cm by 5 cm along the entire anterior abdomen.  Good bowel sounds, hypoactive.  No guarding, rigidity, or rebound.  Otherwise normal. GU: Bilateral descended testes, no inguinal hernias appreciated.

## 2017-12-19 NOTE — Patient Instructions (Signed)
Fecal Impaction A fecal impaction is a large, firm amount of stool (feces) that will not pass out of the body. A fecal impaction usually occurs in the end of the large intestine (rectum). It can block the large intestine and cause significant problems. What are the causes? This condition may be caused by anything that slows down bowel movements, including:  Long-term use of medicines that help you have a bowel movement (laxatives).  Constipation.  Pain in the rectum. Fecal impaction can occur if you avoid having bowel movements due to the pain. Pain in the rectum can result from a medical condition, such as hemorrhoids or anal fissures.  Narcotic pain-relieving medicines, such as methadone, morphine, or codeine.  Not drinking enough fluids.  Being inactive for a long period of time.  Diseases of the brain or nervous system that damage nerves that control the muscles of the intestines.  What are the signs or symptoms? Symptoms of this condition include:  Breathing problems.  Nausea, vomiting, and dehydration.  Dizziness.  Confusion.  Rapid heartbeat.  Fever.  Sweating.  Changes in blood pressure.  Not having a normal number of bowel movements.  Changes in bowel patterns. This may include going to the bathroom less often or not at all.  A sense of fullness in the rectum but being unable to pass stool.  Pain or cramps in the abdominal area. These often happen after meals.  Thin, watery discharge from the rectum.  How is this diagnosed? This condition may be diagnosed based on your symptoms and an exam of your rectum. Sometimes X-rays or lab tests are done to confirm the diagnosis and to check for other problems. How is this treated? This condition may be treated by:  Having your health care provider remove the stool using a gloved finger.  Taking medicine.  A suppository or enema given in the rectum to soften the stool, which can stimulate a bowel  movement.  Follow these instructions at home: Eating and drinking  Drink enough fluid to keep your urine clear or pale yellow.  Include a lot of fiber in your diet. Foods with a lot of fiber include fruits, vegetables, and oatmeal.  If you begin to get constipated, increase the amount of fiber in your diet. General instructions  Develop bowel habits. An example of a bowel habit is having a bowel movement right after breakfast every day. Be sure to give yourself enough time on the toilet. This may require using enemas, bowel softeners, or suppositories at home, as directed by your health care provider. It may also include using mineral oil or olive oil.  Exercise regularly.  Take over-the-counter and prescription medicines only as told by your health care provider. Contact a health care provider if:  You have ongoing pain in your rectum.  You need to use an enema or a suppository more than 2 times a week.  You have rectal bleeding.  You continue to have problems. The problems may include not being able to go to the bathroom and long-term (chronic) constipation.  You have pain in your abdomen.  You have thin, pencil-like stools. Get help right away if:  You have black or tarry stools. This information is not intended to replace advice given to you by your health care provider. Make sure you discuss any questions you have with your health care provider. Document Released: 02/26/2004 Document Revised: 01/07/2016 Document Reviewed: 12/09/2015 Elsevier Interactive Patient Education  [employer].  Constipation, Adult Constipation is when a person has fewer bowel movements in a week than normal, has difficulty having a bowel movement, or has stools that are dry, hard, or larger than normal. Constipation may be caused by an underlying condition. It may become worse with age if a person takes certain medicines and does not take in enough fluids. Follow these instructions at  home: Eating and drinking   Eat foods that have a lot of fiber, such as fresh fruits and vegetables, whole grains, and beans.  Limit foods that are high in fat, low in fiber, or overly processed, such as french fries, hamburgers, cookies, candies, and soda.  Drink enough fluid to keep your urine clear or pale yellow. General instructions  Exercise regularly or as told by your health care provider.  Go to the restroom when you have the urge to go. Do not hold it in.  Take over-the-counter and prescription medicines only as told by your health care provider. These include any fiber supplements.  Practice pelvic floor retraining exercises, such as deep breathing while relaxing the lower abdomen and pelvic floor relaxation during bowel movements.  Watch your condition for any changes.  Keep all follow-up visits as told by your health care provider. This is important. Contact a health care provider if:  You have pain that gets worse.  You have a fever.  You do not have a bowel movement after 4 days.  You vomit.  You are not hungry.  You lose weight.  You are bleeding from the anus.  You have thin, pencil-like stools. Get help right away if:  You have a fever and your symptoms suddenly get worse.  You leak stool or have blood in your stool.  Your abdomen is bloated.  You have severe pain in your abdomen.  You feel dizzy or you faint. This information is not intended to replace advice given to you by your health care provider. Make sure you discuss any questions you have with your health care provider. Document Released: 03/03/2004 Document Revised: 12/24/2015 Document Reviewed: 11/24/2015 Elsevier Interactive Patient Education  2018 [employer].

## 2017-12-26 NOTE — Progress Notes (Signed)
HPI: FUAS/MR,s/p TAVR, atrial fibrillation and CAD. Patient has had previous PCI of the obtuse marginal.  Last cardiac catheterization February 2019 showed mild nonobstructive coronary disease, moderate pulmonary hypertension and large V waves consistent with severe mitral regurgitation. 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   Carotid Dopplers February 2019 showed 1 to 39% bilateral stenosis.  Underwent TAVR 3/19.  He is felt not to be candidate for MitraClip for mitral regurgitation.  Last echocardiogram May 2019 showed normal LV systolic function, status post aortic valve replacement with trace aortic insufficiency, prolapse of mitral valve with severe mitral regurgitation, severe biatrial enlargement and mild tricuspid regurgitation.  Note preoperative CTA showed a large left-sided bladder wall diverticulum and urothelial neoplasm could not be excluded.  Urology consultation suggested.  Since last seen, patient has mild dyspnea on exertion and occasional pedal edema.  No chest pain.  No syncope or bleeding.  He does describe some weakness.  Current Outpatient Medications  Medication Sig Dispense Refill  . acetaminophen (TYLENOL) 500 MG tablet Take 1,000 mg by mouth every 6 (six) hours as needed for moderate pain or headache.    . alfuzosin (UROXATRAL) 10 MG 24 hr tablet Take 10 mg by mouth daily with breakfast.    . ARTIFICIAL TEAR OP Place 1 drop into both eyes daily as needed (dry eyes).    . cholecalciferol 2000 units TABS Take 1 tablet (2,000 Units total) by mouth daily. 30 tablet 0  . clindamycin (CLEOCIN) 300 MG capsule Take 2 capsules (600 mg total) by mouth as directed. Take one hour prior to  dental visits. 2 capsule 10  . Dextromethorphan-Guaifenesin (MUCINEX DM) 30-600 MG TB12 Take 1-2 tablets by mouth every 12 (twelve) hours as needed (for cough/congestion.).    Marland Kitchen digoxin (LANOXIN) 0.125 MG tablet Take 1 tablet (125 mcg total) by mouth daily. (Patient taking differently: Take 0.0625 mg by mouth daily. ) 90 tablet 3  . finasteride (PROSCAR) 5 MG tablet Take 5 mg by mouth at bedtime.     . furosemide (LASIX) 40 MG tablet Take 1 tablet (40 mg total) by mouth daily. 30 tablet 3  . Melatonin 1 MG TABS Take 5 mg by mouth at bedtime.     . Menthol, Topical Analgesic, (BIOFREEZE EX) Apply 1 application topically 3 (three) times daily as needed (muscle pain in back). alterrnates between [employer] and [employer]    . Menthol, Topical Analgesic, (BLUE-EMU MAXIMUM STRENGTH EX) Apply 1 application topically 3 (three) times daily as needed (muscle pain in back). alterrnates between [employer] and [employer]    . methocarbamol (ROBAXIN) 500 MG tablet Take 500 mg by mouth as needed (cramps).     . metoprolol tartrate (LOPRESSOR) 25 MG tablet Take 0.5 tablets (12.5 mg total) by mouth 2 (two) times daily. 30 tablet 6  . potassium chloride (K-DUR,KLOR-CON) 10 MEQ tablet Take 1 tablet (10 mEq total) by mouth daily. 30 tablet 3  . sodium chloride (OCEAN) 0.65 % SOLN nasal spray Place 1 spray into both nostrils daily as needed for congestion.    . triamcinolone cream (KENALOG) 0.1 % Apply 1 application topically daily as needed (dry skin).     Marland Kitchen warfarin (COUMADIN) 2.5 MG tablet Take 2.5 mg on Saturday, Tuesday  and Thursday.  Take 5 mg on Sunday, Monday and Wednesday. (Patient taking differently: Take 2.5 mg on Saturday, Tuesday and Thursday.  Take 5 mg on Sunday, Monday and Wednesday and Friday.) 30 tablet 0   No current facility-administered medications for this visit.      Past Medical History:  Diagnosis Date  . Aortic stenosis   . Arthritis   . Atrial fibrillation (HCC)    Chronic  . Atrial  fibrillation, chronic ([place])   . BPH (benign prostatic hyperplasia)   . CAD (coronary artery disease)   . Chronic anticoagulation   . History of chicken pox   . HTN (hypertension)   . Mitral stenosis   . Old MI (myocardial infarction) 2007   s/p PCI to distal OM (no stent)  . Severe mitral regurgitation   . 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 CORONARY ANGIOPLASTY  2007   Distal OM  . PROSTATE BIOPSY    . RIGHT/LEFT HEART CATH AND CORONARY ANGIOGRAPHY N/A 08/01/2017   Procedure: RIGHT/LEFT HEART CATH AND CORONARY ANGIOGRAPHY;  Surgeon: Sherren Mocha, MD;  Location: [place] [facility];  Service: Cardiovascular;  Laterality: N/A;  . TEE WITHOUT CARDIOVERSION N/A 07/20/2017   Procedure: TRANSESOPHAGEAL ECHOCARDIOGRAM (TEE);  Surgeon: Lelon Perla, MD;  Location: [facility];  Service: Cardiovascular;  Laterality: N/A;  . TEE WITHOUT CARDIOVERSION N/A 09/04/2017   Procedure: TRANSESOPHAGEAL ECHOCARDIOGRAM (TEE);  Surgeon: Sherren Mocha, MD;  Location: [facility];  Service: Open Heart Surgery;  Laterality: N/A;  . TRANSCATHETER AORTIC VALVE REPLACEMENT, TRANSFEMORAL N/A 09/04/2017   Procedure: TRANSCATHETER AORTIC VALVE REPLACEMENT, TRANSFEMORAL;  Surgeon: Sherren Mocha, MD;  Location: [place];  Service: Open Heart Surgery;  Laterality: N/A;    Social History   Socioeconomic History  . Marital status: Married    Spouse name: Not on file  . Number of children: 3  . Years of education: 19  . Highest education level: Not on file  Occupational History  . Not on file  Social Needs  . Financial resource strain: Not on file  . Food insecurity:    Worry: Not on file    Inability: Not on file  . Transportation needs:    Medical: Not on file    Non-medical: Not on file  Tobacco Use  . Smoking status: Never Smoker  . Smokeless tobacco: Never Used  Substance and Sexual Activity  . Alcohol use: No  . Drug  use: No  . Sexual activity: Not on file  Lifestyle  . Physical activity:    Days per week: Not on file    Minutes per session: Not on file  . Stress: Not on file  Relationships  . Social connections:    Talks on phone: Not on file    Gets together: Not on file    Attends religious service: Not on file    Active member of club or organization: Not on file    Attends meetings of clubs or organizations: Not on file    Relationship status: Not on file  . Intimate partner violence:    Fear of current or ex partner: Not on file    Emotionally abused: Not on file    Physically abused: Not on file    Forced sexual activity: Not on file  Other Topics Concern  . Not on file  Social History Narrative   Fun: Garden, work in the  yard, anything physical and walk daily    Family History  Problem Relation Age of Onset  . Heart disease Mother   . Heart attack Mother   . Heart disease Father   . Heart attack Father   . Heart attack Brother   . Heart attack Sister   . Stroke Brother     ROS: Generalized weakness but no fevers or chills, productive cough, hemoptysis, dysphasia, odynophagia, melena, hematochezia, dysuria, hematuria, rash, seizure activity, orthopnea, PND, claudication. Remaining systems are negative.  Physical Exam: Well-developed frail in no acute distress.  Skin is warm and dry.  HEENT is normal.  Neck is supple.  Chest is clear to auscultation with normal expansion.  Cardiovascular exam is irregular, 2/6 systolic murmur right sternal border.  No diastolic murmur.  3/6 systolic murmur apex Abdominal exam nontender or distended. No masses palpated. Extremities show trace edema. neuro grossly intact   A/P  1 severe aortic stenosis status post aortic valve replacement-some improvement in symptoms.  Continue SBE prophylaxis.  2 permanent atrial fibrillation-patient's heart rate is controlled.  Continue metoprolol and digoxin.  Continue Coumadin.  3 severe mitral  regurgitation-plan medical therapy.  Not a candidate for MitraClip.  4 chronic diastolic congestive heart failure-patient appears to be euvolemic today.  Continue present dose of Lasix.  Continue low-sodium diet and fluid restriction.  5 coronary artery disease-patient has declined statins previously.  He is not on aspirin given need for Coumadin.  6 hypertension-blood pressure is controlled.  Continue present medications.  7 left-sided bladder diverticulum-patient states he followed up with urology concerning this issue.  Kirk Ruths, MD

## 2017-12-31 ENCOUNTER — Ambulatory Visit (INDEPENDENT_AMBULATORY_CARE_PROVIDER_SITE_OTHER): Payer: Medicare HMO | Admitting: Pharmacist

## 2017-12-31 ENCOUNTER — Encounter: Payer: Self-pay | Admitting: Cardiology

## 2017-12-31 ENCOUNTER — Ambulatory Visit: Payer: Medicare HMO | Admitting: Cardiology

## 2017-12-31 VITALS — BP 108/63 | HR 82 | Ht 70.5 in | Wt 176.0 lb

## 2017-12-31 DIAGNOSIS — Z5181 Encounter for therapeutic drug level monitoring: Secondary | ICD-10-CM | POA: Diagnosis not present

## 2017-12-31 DIAGNOSIS — I482 Chronic atrial fibrillation, unspecified: Secondary | ICD-10-CM

## 2017-12-31 DIAGNOSIS — I10 Essential (primary) hypertension: Secondary | ICD-10-CM | POA: Diagnosis not present

## 2017-12-31 DIAGNOSIS — I4821 Permanent atrial fibrillation: Secondary | ICD-10-CM

## 2017-12-31 DIAGNOSIS — Z952 Presence of prosthetic heart valve: Secondary | ICD-10-CM | POA: Diagnosis not present

## 2017-12-31 LAB — POCT INR: INR: 1.7 — AB (ref 2.0–3.0)

## 2017-12-31 NOTE — Patient Instructions (Signed)
Your physician wants you to follow-up in: 6 MONTHS WITH DR CRENSHAW You will receive a reminder letter in the mail two months in advance. If you don't receive a letter, please call our office to schedule the follow-up appointment.   If you need a refill on your cardiac medications before your next appointment, please call your pharmacy.  

## 2018-01-14 ENCOUNTER — Other Ambulatory Visit: Payer: Self-pay | Admitting: Cardiology

## 2018-01-15 NOTE — Telephone Encounter (Signed)
Rx request sent to pharmacy.  

## 2018-01-21 ENCOUNTER — Emergency Department (HOSPITAL_COMMUNITY): Payer: Medicare HMO

## 2018-01-21 ENCOUNTER — Encounter (HOSPITAL_COMMUNITY): Payer: Self-pay | Admitting: Emergency Medicine

## 2018-01-21 ENCOUNTER — Inpatient Hospital Stay (HOSPITAL_COMMUNITY)
Admission: EM | Admit: 2018-01-21 | Discharge: 2018-01-28 | DRG: 536 | Disposition: A | Discharge status: Skilled Nursing Facility | Payer: Medicare HMO | Attending: Internal Medicine | Admitting: Internal Medicine

## 2018-01-21 DIAGNOSIS — S299XXA Unspecified injury of thorax, initial encounter: Secondary | ICD-10-CM | POA: Diagnosis not present

## 2018-01-21 DIAGNOSIS — S0990XA Unspecified injury of head, initial encounter: Secondary | ICD-10-CM | POA: Diagnosis not present

## 2018-01-21 DIAGNOSIS — W19XXXA Unspecified fall, initial encounter: Secondary | ICD-10-CM | POA: Diagnosis not present

## 2018-01-21 DIAGNOSIS — I10 Essential (primary) hypertension: Secondary | ICD-10-CM | POA: Diagnosis not present

## 2018-01-21 DIAGNOSIS — Y92009 Unspecified place in unspecified non-institutional (private) residence as the place of occurrence of the external cause: Secondary | ICD-10-CM

## 2018-01-21 DIAGNOSIS — D62 Acute posthemorrhagic anemia: Secondary | ICD-10-CM | POA: Diagnosis not present

## 2018-01-21 DIAGNOSIS — Z888 Allergy status to other drugs, medicaments and biological substances status: Secondary | ICD-10-CM

## 2018-01-21 DIAGNOSIS — D696 Thrombocytopenia, unspecified: Secondary | ICD-10-CM | POA: Diagnosis not present

## 2018-01-21 DIAGNOSIS — I110 Hypertensive heart disease with heart failure: Secondary | ICD-10-CM | POA: Diagnosis present

## 2018-01-21 DIAGNOSIS — R4182 Altered mental status, unspecified: Secondary | ICD-10-CM | POA: Diagnosis not present

## 2018-01-21 DIAGNOSIS — K5900 Constipation, unspecified: Secondary | ICD-10-CM | POA: Diagnosis not present

## 2018-01-21 DIAGNOSIS — M25512 Pain in left shoulder: Secondary | ICD-10-CM

## 2018-01-21 DIAGNOSIS — I951 Orthostatic hypotension: Secondary | ICD-10-CM | POA: Diagnosis not present

## 2018-01-21 DIAGNOSIS — S32592A Other specified fracture of left pubis, initial encounter for closed fracture: Secondary | ICD-10-CM | POA: Diagnosis not present

## 2018-01-21 DIAGNOSIS — S32512A Fracture of superior rim of left pubis, initial encounter for closed fracture: Secondary | ICD-10-CM | POA: Diagnosis not present

## 2018-01-21 DIAGNOSIS — F419 Anxiety disorder, unspecified: Secondary | ICD-10-CM | POA: Diagnosis present

## 2018-01-21 DIAGNOSIS — S32511A Fracture of superior rim of right pubis, initial encounter for closed fracture: Secondary | ICD-10-CM | POA: Diagnosis not present

## 2018-01-21 DIAGNOSIS — Z7901 Long term (current) use of anticoagulants: Secondary | ICD-10-CM

## 2018-01-21 DIAGNOSIS — N501 Vascular disorders of male genital organs: Secondary | ICD-10-CM | POA: Diagnosis not present

## 2018-01-21 DIAGNOSIS — S300XXA Contusion of lower back and pelvis, initial encounter: Secondary | ICD-10-CM | POA: Diagnosis present

## 2018-01-21 DIAGNOSIS — Z952 Presence of prosthetic heart valve: Secondary | ICD-10-CM

## 2018-01-21 DIAGNOSIS — G9341 Metabolic encephalopathy: Secondary | ICD-10-CM | POA: Diagnosis not present

## 2018-01-21 DIAGNOSIS — Z885 Allergy status to narcotic agent status: Secondary | ICD-10-CM

## 2018-01-21 DIAGNOSIS — Z8673 Personal history of transient ischemic attack (TIA), and cerebral infarction without residual deficits: Secondary | ICD-10-CM

## 2018-01-21 DIAGNOSIS — I5032 Chronic diastolic (congestive) heart failure: Secondary | ICD-10-CM | POA: Diagnosis present

## 2018-01-21 DIAGNOSIS — Z881 Allergy status to other antibiotic agents status: Secondary | ICD-10-CM

## 2018-01-21 DIAGNOSIS — Z953 Presence of xenogenic heart valve: Secondary | ICD-10-CM

## 2018-01-21 DIAGNOSIS — Z9861 Coronary angioplasty status: Secondary | ICD-10-CM

## 2018-01-21 DIAGNOSIS — S199XXA Unspecified injury of neck, initial encounter: Secondary | ICD-10-CM | POA: Diagnosis not present

## 2018-01-21 DIAGNOSIS — I482 Chronic atrial fibrillation, unspecified: Secondary | ICD-10-CM | POA: Diagnosis present

## 2018-01-21 DIAGNOSIS — N400 Enlarged prostate without lower urinary tract symptoms: Secondary | ICD-10-CM | POA: Diagnosis present

## 2018-01-21 DIAGNOSIS — Z66 Do not resuscitate: Secondary | ICD-10-CM | POA: Diagnosis present

## 2018-01-21 DIAGNOSIS — W010XXA Fall on same level from slipping, tripping and stumbling without subsequent striking against object, initial encounter: Secondary | ICD-10-CM | POA: Diagnosis present

## 2018-01-21 DIAGNOSIS — I2510 Atherosclerotic heart disease of native coronary artery without angina pectoris: Secondary | ICD-10-CM | POA: Diagnosis present

## 2018-01-21 DIAGNOSIS — I252 Old myocardial infarction: Secondary | ICD-10-CM

## 2018-01-21 DIAGNOSIS — N179 Acute kidney failure, unspecified: Secondary | ICD-10-CM | POA: Diagnosis present

## 2018-01-21 DIAGNOSIS — R0902 Hypoxemia: Secondary | ICD-10-CM | POA: Diagnosis not present

## 2018-01-21 DIAGNOSIS — F05 Delirium due to known physiological condition: Secondary | ICD-10-CM | POA: Diagnosis not present

## 2018-01-21 DIAGNOSIS — Z880 Allergy status to penicillin: Secondary | ICD-10-CM

## 2018-01-21 DIAGNOSIS — M25552 Pain in left hip: Secondary | ICD-10-CM | POA: Diagnosis not present

## 2018-01-21 DIAGNOSIS — Z79899 Other long term (current) drug therapy: Secondary | ICD-10-CM

## 2018-01-21 LAB — CBC WITH DIFFERENTIAL/PLATELET
Abs Immature Granulocytes: 0.1 10*3/uL (ref 0.0–0.1)
BASOS ABS: 0.1 10*3/uL (ref 0.0–0.1)
BASOS PCT: 1 %
EOS ABS: 0.2 10*3/uL (ref 0.0–0.7)
EOS PCT: 2 %
HCT: 38.6 % — ABNORMAL LOW (ref 39.0–52.0)
Hemoglobin: 12.2 g/dL — ABNORMAL LOW (ref 13.0–17.0)
Immature Granulocytes: 1 %
Lymphocytes Relative: 11 %
Lymphs Abs: 0.9 10*3/uL (ref 0.7–4.0)
MCH: 29.8 pg (ref 26.0–34.0)
MCHC: 31.6 g/dL (ref 30.0–36.0)
MCV: 94.1 fL (ref 78.0–100.0)
Monocytes Absolute: 0.6 10*3/uL (ref 0.1–1.0)
Monocytes Relative: 8 %
Neutro Abs: 6.5 10*3/uL (ref 1.7–7.7)
Neutrophils Relative %: 77 %
PLATELETS: 151 10*3/uL (ref 150–400)
RBC: 4.1 MIL/uL — AB (ref 4.22–5.81)
RDW: 15.4 % (ref 11.5–15.5)
WBC: 8.4 10*3/uL (ref 4.0–10.5)

## 2018-01-21 LAB — COMPREHENSIVE METABOLIC PANEL
ALBUMIN: 3.7 g/dL (ref 3.5–5.0)
ALT: 14 U/L (ref 0–44)
ANION GAP: 7 (ref 5–15)
AST: 24 U/L (ref 15–41)
Alkaline Phosphatase: 39 U/L (ref 38–126)
BUN: 17 mg/dL (ref 8–23)
CHLORIDE: 102 mmol/L (ref 98–111)
CO2: 32 mmol/L (ref 22–32)
Calcium: 9 mg/dL (ref 8.9–10.3)
Creatinine, Ser: 1.23 mg/dL (ref 0.61–1.24)
GFR calc Af Amer: 59 mL/min — ABNORMAL LOW (ref >60)
GFR calc non Af Amer: 51 mL/min — ABNORMAL LOW (ref >60)
GLUCOSE: 107 mg/dL — AB (ref 70–99)
POTASSIUM: 4.1 mmol/L (ref 3.5–5.1)
SODIUM: 141 mmol/L (ref 135–145)
Total Bilirubin: 1 mg/dL (ref 0.3–1.2)
Total Protein: 6.6 g/dL (ref 6.5–8.1)

## 2018-01-21 LAB — PROTIME-INR
INR: 1.52
Prothrombin Time: 18.2 seconds — ABNORMAL HIGH (ref 11.4–15.2)

## 2018-01-21 LAB — TYPE AND SCREEN
ABO/RH(D): A POS
ANTIBODY SCREEN: NEGATIVE

## 2018-01-21 MED ORDER — FENTANYL CITRATE (PF) 100 MCG/2ML IJ SOLN
25.0000 ug | INTRAMUSCULAR | Status: AC | PRN
  Administered 2018-01-21: 25 ug via INTRAVENOUS
  Filled 2018-01-21: qty 2

## 2018-01-21 MED ORDER — ACETAMINOPHEN 325 MG PO TABS
650.0000 mg | ORAL_TABLET | Freq: Once | ORAL | Status: AC
  Administered 2018-01-21: 650 mg via ORAL
  Filled 2018-01-21: qty 2

## 2018-01-21 NOTE — ED Triage Notes (Signed)
Pt arrives via EMS after a fall. Pt reports trying to transfer and fell backwards. Denies hitting head or LOC. Left pelvic tenderness. On coumadin. Pt reports trouble ambulating at baseline, uses walker/cane.

## 2018-01-21 NOTE — ED Provider Notes (Signed)
[place] EMERGENCY DEPARTMENT Provider Note   CSN: [id number] Arrival date & time: 01/21/18  [place]     History   Chief Complaint Chief Complaint  Patient presents with  . Fall    HPI Wayne [name] is a 84 y.o. male.  HPI Patient is an 19-year male with history of chronic atrial fibrillation on Coumadin, CAD with history of PCI, history of prior CVA, aortic stenosis status post TAVR approximately 4 months ago who presents to the emergency department today for evaluation after ground level mechanical fall.  Lives at home with his wife.  States he stood up from a chair and lost balance and fell onto his buttocks. No syncope/presyncope preceding this fall.  Does not think he struck his head.  No loss of consciousness.  Has had some mild neck pain.  No weakness or numbness.  Has been unable to ambulate due to left buttock/hip pain since falling. Uses cane/walker at baseline.   Denies any abdominal pain.  No nausea or vomiting.  No lacerations or abrasions.  Reports his wife was able to call 911 for him.  No headache or vision changes.  Past Medical History:  Diagnosis Date  . Aortic stenosis   . Arthritis   . Atrial fibrillation (HCC)    Chronic  . Atrial fibrillation, chronic ([place])   . BPH (benign prostatic hyperplasia)   . CAD (coronary artery disease)   . Chronic anticoagulation   . History of chicken pox   . HTN (hypertension)   . Mitral stenosis   . Old MI (myocardial infarction) 2007   s/p PCI to distal OM (no stent)  . Severe mitral regurgitation   . Stroke, embolic [facility])    7829-5 weeks after his heart attack    Patient Active Problem List   Diagnosis Date Noted  . Fall 01/21/2018  . Fracture of pubic ramus-left 01/21/2018  . Constipation 12/19/2017  .  hernia of linea alba 12/19/2017  . Osteoarthritis of hip 09/24/2017  . Gastroenteritis 09/10/2017  . S/P TAVR (transcatheter aortic valve replacement) 09/04/2017  . Severe mitral  regurgitation   . Insomnia 07/10/2017  . High risk medication use 07/10/2017  . Long term current use of anticoagulant 07/10/2017  . Chronic diastolic CHF (congestive heart failure) ([place]) 05/20/2017  . Hyponatremia 05/20/2017  . Severe aortic stenosis 05/20/2017  . Urinary retention   . 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 Spinal stenosis at L4-L5 level 04/26/2017  . Lumbar radiculopathy, chronic 04/26/2017  . Benign colonic polyp- 25 yrs ago.  11/16/2016  . Vitamin D deficiency 11/16/2016  . H/O non anemic vitamin B12 deficiency 11/16/2016  . Chronic Colonic dysmotility 11/16/2016  . Hypokalemia 09/13/2016  . BPH with obstruction/lower urinary tract symptoms 09/13/2016  . Urinary frequency 07/10/2016  . Left bundle branch block (LBBB) on electrocardiogram 06/30/2016  . History of stroke 06/30/2016  . Generalized weakness 06/30/2016  . Osteoarthritis 04/04/2016  . Encounter for therapeutic drug monitoring 08/06/2013  . Chronic anticoagulation   . HTN (hypertension)   . CAD (coronary artery disease)   . Chronic atrial fibrillation ([place]) 09/13/2010  . h/o CVA (cerebrovascular accident due to intracerebral hemorrhage) 09/13/2010    Past Surgical History:  Procedure Laterality Date  . CORONARY ANGIOPLASTY  2007   Distal OM  . PROSTATE BIOPSY    . RIGHT/LEFT HEART CATH AND CORONARY ANGIOGRAPHY N/A 08/01/2017   Procedure: RIGHT/LEFT HEART CATH AND CORONARY ANGIOGRAPHY;  Surgeon: Sherren Mocha,  MD;  Location: [place] CV LAB;  Service: Cardiovascular;  Laterality: N/A;  . TEE WITHOUT CARDIOVERSION N/A 07/20/2017   Procedure: TRANSESOPHAGEAL ECHOCARDIOGRAM (TEE);  Surgeon: Lelon Perla, MD;  Location: [facility] ENDOSCOPY;  Service: Cardiovascular;  Laterality: N/A;  . TEE WITHOUT CARDIOVERSION N/A 09/04/2017   Procedure: TRANSESOPHAGEAL ECHOCARDIOGRAM (TEE);  Surgeon: Sherren Mocha, MD;  Location: [place];  Service: Open Heart Surgery;  Laterality: N/A;    . TRANSCATHETER AORTIC VALVE REPLACEMENT, TRANSFEMORAL N/A 09/04/2017   Procedure: TRANSCATHETER AORTIC VALVE REPLACEMENT, TRANSFEMORAL;  Surgeon: Sherren Mocha, MD;  Location: [place];  Service: Open Heart Surgery;  Laterality: N/A;        Home Medications    Prior to Admission medications   Medication Sig Start Date End Date Taking? Authorizing Provider  acetaminophen (TYLENOL) 500 MG tablet Take 1,000 mg by mouth every 6 (six) hours as needed for moderate pain or headache.   Yes [provider]  alfuzosin (UROXATRAL) 10 MG 24 hr tablet Take 10 mg by mouth daily with breakfast.   Yes [provider]  ARTIFICIAL TEAR OP Place 1 drop into both eyes daily as needed (dry eyes).   Yes [provider]  cholecalciferol 2000 units TABS Take 1 tablet (2,000 Units total) by mouth daily. 05/24/17  Yes Regalado, Belkys A, MD  clindamycin (CLEOCIN) 300 MG capsule Take 2 capsules (600 mg total) by mouth as directed. Take one hour prior to dental visits. 10/24/17  Yes Sherren Mocha, MD  Dextromethorphan-Guaifenesin [facility] DM) 30-600 MG TB12 Take 1-2 tablets by mouth every 12 (twelve) hours as needed (for cough/congestion.).   Yes [provider]  digoxin (LANOXIN) 0.125 MG tablet Take 1 tablet (125 mcg total) by mouth daily. 09/18/17  Yes Lendon Colonel, NP  docusate sodium (COLACE) 100 MG capsule Take 100 mg by mouth every evening.   Yes [provider]  finasteride (PROSCAR) 5 MG tablet Take 5 mg by mouth at bedtime.  02/14/17  Yes Lelon Perla, MD  furosemide (LASIX) 40 MG tablet Take 1 tablet (40 mg total) by mouth daily. Patient taking differently: Take 20 mg by mouth 2 (two) times daily as needed for fluid.  09/18/17  Yes Lendon Colonel, NP  Melatonin 1 MG TABS Take 5 mg by mouth at bedtime.    Yes [provider]  Menthol, Topical Analgesic, (BIOFREEZE EX) Apply 1 application topically 3 (three) times daily as needed (muscle pain in  back). alterrnates between [employer] and Blu Emu   Yes [provider]  Menthol, Topical Analgesic, (BLUE-EMU MAXIMUM STRENGTH EX) Apply 1 application topically 3 (three) times daily as needed (muscle pain in back). alterrnates between [employer] and Blu [employer]mu   Yes [provider]  methocarbamol (ROBAXIN) 500 MG tablet Take 500 mg by mouth as needed (cramps).    Yes [provider]  metoprolol tartrate (LOPRESSOR) 25 MG tablet Take 0.5 tablets (12.5 mg total) by mouth 2 (two) times daily. 09/18/17  Yes Lendon Colonel, NP  polyethylene glycol [facility] / GLYCOLAX) packet Take 17 g by mouth daily.   Yes [provider]  potassium chloride (K-DUR,KLOR-CON) 10 MEQ tablet Take 1 tablet (10 mEq total) by mouth daily. 09/18/17  Yes Lendon Colonel, NP  sodium chloride (OCEAN) 0.65 % SOLN nasal spray Place 1 spray into both nostrils daily as needed for congestion.   Yes [provider]  triamcinolone cream (KENALOG) 0.1 % Apply 1 application topically daily as needed (  dry skin).    Yes [provider]  warfarin (COUMADIN) 2.5 MG tablet Take 2.5 mg on Saturday, Tuesday and Thursday.  Take 5 mg on Sunday, Monday and Wednesday. Patient taking differently: Take 2.5-5 mg by mouth See admin instructions. Take 2.5 mg on Saturday, Tuesday and Thursday.  Take 5 mg on Sunday, Monday and Wednesday and Friday. 09/15/17  Yes Regalado, Cassie Freer, MD    Family History Family History  Problem Relation Age of Onset  . Heart disease Mother   . Heart attack Mother   . Heart disease Father   . Heart attack Father   . Heart attack Brother   . Heart attack Sister   . Stroke Brother     Social History Social History   Tobacco Use  . Smoking status: Never Smoker  . Smokeless tobacco: Never Used  Substance Use Topics  . Alcohol use: No  . Drug use: No     Allergies   Penicillins; Prednisone; Procaine hcl; Levofloxacin; and Oxycodone   Review of  Systems Review of Systems  Constitutional: Negative for chills and fever.  HENT: Negative for congestion.   Eyes: Negative for visual disturbance.  Respiratory: Negative for cough and shortness of breath.   Cardiovascular: Negative for chest pain and leg swelling.  Gastrointestinal: Negative for abdominal pain, diarrhea, nausea and vomiting.  Genitourinary: Negative for dysuria and hematuria.  Musculoskeletal: Positive for arthralgias (left buttock/hip), gait problem and neck pain. Negative for back pain and joint swelling.  Skin: Negative for rash and wound.  Neurological: Negative for weakness, numbness and headaches.     Physical Exam Updated Vital Signs BP 134/82   Pulse 89   Temp 97.7 F (36.5 C) (Oral)   Resp (!) 25   Ht 5' 10.5" (1.791 m)   Wt 79.8 kg (176 lb)   SpO2 94%   BMI 24.90 kg/m   Physical Exam  Constitutional: No distress.  elderly  HENT:  Head: Normocephalic and atraumatic.  Eyes: Pupils are equal, round, and reactive to light. Conjunctivae are normal. Right eye exhibits no discharge. Left eye exhibits no discharge.  32mm reactive bilat  Neck: Normal range of motion. No tracheal deviation present.  No significant reproducible CTL spine pain.   Cardiovascular: Normal heart sounds and intact distal pulses.  Murmur: 2/6 SEM. Irregularly irregular.  Pulmonary/Chest: Effort normal and breath sounds normal. No respiratory distress.  Abdominal: Soft. Bowel sounds are normal. He exhibits no distension. There is no tenderness. There is no rebound and no guarding.  Musculoskeletal: He exhibits no edema or deformity.  Pelvis stable to AP and lateral compression.  Tenderness over the left buttock/hip region.  Palpation of the upper extremities unremarkable with no pain.  Palpation from the thighs down to feet bilaterally with no significant pain.  No obvious deformities.  Symmetric pulses in all 4 extremities  Neurological: He is alert. He exhibits normal muscle tone.   Skin: No rash noted. He is not diaphoretic.  Psychiatric: He has a normal mood and affect.     ED Treatments / Results  Labs (all labs ordered are listed, but only abnormal results are displayed) Labs Reviewed  CBC WITH DIFFERENTIAL/PLATELET - Abnormal; Notable for the following components:      Result Value   RBC 4.10 (*)    Hemoglobin 12.2 (*)    HCT 38.6 (*)    All other components within normal limits  COMPREHENSIVE METABOLIC PANEL - Abnormal; Notable for the following components:   Glucose,  Bld 107 (*)    GFR calc non Af Amer 51 (*)    GFR calc Af Amer 59 (*)    All other components within normal limits  PROTIME-INR - Abnormal; Notable for the following components:   Prothrombin Time 18.2 (*)    All other components within normal limits  TYPE AND SCREEN    EKG None  Radiology Ct Abdomen Pelvis Wo Contrast  Result Date: 01/21/2018 CLINICAL DATA:  Status post fall backwards, with left pelvic tenderness. Initial encounter. EXAM: CT ABDOMEN AND PELVIS WITHOUT CONTRAST TECHNIQUE: Multidetector CT imaging of the abdomen and pelvis was performed following the standard protocol without IV contrast. COMPARISON:  CTA of the abdomen and pelvis performed 08/06/2017 FINDINGS: Lower chest: Small bilateral pleural effusions are noted, with bibasilar atelectasis. A valve replacement is noted at the aortic valve. Diffuse coronary artery calcifications are seen. Hepatobiliary: The liver is unremarkable in appearance. The gallbladder is unremarkable in appearance. The common bile duct remains normal in caliber. Pancreas: The pancreas is within normal limits. Spleen: The spleen is unremarkable in appearance. Adrenals/Urinary Tract: The adrenal glands are unremarkable in appearance. A small left renal cyst is noted. Nonspecific perinephric stranding is noted bilaterally. There is no evidence of hydronephrosis. No renal or ureteral stones are identified. Stomach/Bowel: The stomach is unremarkable in  appearance. The small bowel is within normal limits. The appendix is normal in caliber, without evidence of appendicitis. The colon is unremarkable in appearance. Vascular/Lymphatic: Diffuse calcification is seen along the abdominal aorta and its branches. The abdominal aorta is otherwise grossly unremarkable. The inferior vena cava is grossly unremarkable. No retroperitoneal lymphadenopathy is seen. No pelvic sidewall lymphadenopathy is identified. Reproductive: The bladder is mildly distended, with a left-sided diverticulum. The prostate is normal in size. Other: Soft tissue hemorrhage is noted along the space of Retzius, anterior to the bladder, and tracking along the left pelvic sidewall, reflecting the overlying left pubic rami fractures. There is also diffuse soft tissue hemorrhage along the left pectineus and adductor musculature. Musculoskeletal: There are mildly displaced and mildly comminuted fractures of the left superior and inferior pubic rami. No additional fractures are seen. There is mild chronic loss of height at vertebral body L3. IMPRESSION: 1. Mildly displaced and mildly comminuted fractures of the left superior and inferior pubic rami. 2. Soft tissue hemorrhage along the space of Retzius, anterior to the bladder, and tracking along the left pelvic sidewall, reflecting the overlying left pubic rami fractures. 3. Diffuse soft tissue hemorrhage along the left pectineus and adductor musculature. 4. Small bilateral pleural effusions, with bibasilar atelectasis. 5. Diffuse coronary artery calcifications seen. 6. Small left renal cyst noted. Aortic Atherosclerosis (ICD10-I70.0). These results were called by telephone at the time of interpretation on 01/21/2018 at 10:58 pm to Dr. Corrie Dandy , who verbally acknowledged these results. Electronically Signed   By: Garald Balding M.D.   On: 01/21/2018 23:00   Ct Head Wo Contrast  Result Date: 01/21/2018 CLINICAL DATA:  Fall EXAM: CT HEAD WITHOUT CONTRAST  CT CERVICAL SPINE WITHOUT CONTRAST TECHNIQUE: Multidetector CT imaging of the head and cervical spine was performed following the standard protocol without intravenous contrast. Multiplanar CT image reconstructions of the cervical spine were also generated. COMPARISON:  05/21/2017 FINDINGS: CT HEAD FINDINGS Brain: Global atrophy. There are mild chronic ischemic changes in the periventricular white matter. There is no mass effect, midline shift, or acute hemorrhage. Vascular: No hyperdense vessel or unexpected calcification. Skull: Cranium is intact. Sinuses/Orbits: Mastoid air cells are  clear. Visualized paranasal sinuses are clear. Orbits are within normal limits. Other: Noncontributory CT CERVICAL SPINE FINDINGS Alignment: There is anatomic alignment. Skull base and vertebrae: There is no vertebral compression deformity. No obvious fracture. No dislocation. Soft tissues and spinal canal: No obvious spinal canal hematoma. There is no obvious soft tissue hematoma in the neck. Thyroid is unremarkable. Atherosclerotic calcifications of the carotids bilaterally. Disc levels: There are bridging anterior osteophytes throughout the cervical spine. There is also ankylosis of the posterior elements in the upper cervical spine. Left-sided uncovertebral osteophytes cause some left foraminal narrowing at C3-4. There are prominent posterior disc osteophytes at C4-5. This results in an element of central spinal stenosis. Left paracentral and foraminal osteophytes at C6-7 result in severe left foraminal narrowing. Upper chest: There is hazy ground-glass in the visualized left upper lobe. Other: Noncontributory IMPRESSION: No acute intracranial pathology. Chronic ischemic changes are noted. No evidence of cervical spine injury. Degenerative changes are noted above. Hazy ground-glass opacity in the left upper lobe. Electronically Signed   By: Marybelle Killings M.D.   On: 01/21/2018 20:02   Ct Cervical Spine Wo Contrast  Result Date:  01/21/2018 CLINICAL DATA:  Fall EXAM: CT HEAD WITHOUT CONTRAST CT CERVICAL SPINE WITHOUT CONTRAST TECHNIQUE: Multidetector CT imaging of the head and cervical spine was performed following the standard protocol without intravenous contrast. Multiplanar CT image reconstructions of the cervical spine were also generated. COMPARISON:  05/21/2017 FINDINGS: CT HEAD FINDINGS Brain: Global atrophy. There are mild chronic ischemic changes in the periventricular white matter. There is no mass effect, midline shift, or acute hemorrhage. Vascular: No hyperdense vessel or unexpected calcification. Skull: Cranium is intact. Sinuses/Orbits: Mastoid air cells are clear. Visualized paranasal sinuses are clear. Orbits are within normal limits. Other: Noncontributory CT CERVICAL SPINE FINDINGS Alignment: There is anatomic alignment. Skull base and vertebrae: There is no vertebral compression deformity. No obvious fracture. No dislocation. Soft tissues and spinal canal: No obvious spinal canal hematoma. There is no obvious soft tissue hematoma in the neck. Thyroid is unremarkable. Atherosclerotic calcifications of the carotids bilaterally. Disc levels: There are bridging anterior osteophytes throughout the cervical spine. There is also ankylosis of the posterior elements in the upper cervical spine. Left-sided uncovertebral osteophytes cause some left foraminal narrowing at C3-4. There are prominent posterior disc osteophytes at C4-5. This results in an element of central spinal stenosis. Left paracentral and foraminal osteophytes at C6-7 result in severe left foraminal narrowing. Upper chest: There is hazy ground-glass in the visualized left upper lobe. Other: Noncontributory IMPRESSION: No acute intracranial pathology. Chronic ischemic changes are noted. No evidence of cervical spine injury. Degenerative changes are noted above. Hazy ground-glass opacity in the left upper lobe. Electronically Signed   By: Marybelle Killings M.D.   On:  01/21/2018 20:02   Dg Pelvis Portable  Result Date: 01/21/2018 CLINICAL DATA:  84 year old male with history of trauma from a fall. Pain in the left hip. EXAM: PORTABLE PELVIS 1-2 VIEWS COMPARISON:  Left hip radiograph 09/24/2017. FINDINGS: Two portable views of the pelvis demonstrate what appears to be subtle discontinuity of the left superior and inferior pubic rami, concerning for minimally displaced fractures. Bilateral proximal femurs as visualized appear intact and the femoral heads appear located on these frontal projections. IMPRESSION: 1. Probable fractures of the left superior and inferior pubic rami, poorly evaluated on today's portable frontal views. These could be confirmed with noncontrast CT the pelvis if clinically appropriate. Electronically Signed   By: Mauri Brooklyn.D.  On: 01/21/2018 19:40   Dg Chest Portable 1 View  Result Date: 01/21/2018 CLINICAL DATA:  Fall EXAM: PORTABLE CHEST 1 VIEW COMPARISON:  09/10/2017 FINDINGS: The heart remains moderately enlarged. Aortic valve replacement hardware is stable in position. Normal vascularity. No pneumothorax or pleural effusion. There is no evidence of consolidation. IMPRESSION: Cardiomegaly without decompensation. Electronically Signed   By: Marybelle Killings M.D.   On: 01/21/2018 19:39    Procedures Procedures (including critical care time)  Medications Ordered in ED Medications  acetaminophen (TYLENOL) tablet 650 mg (650 mg Oral Given 01/21/18 2137)  fentaNYL (SUBLIMAZE) injection 25 mcg (25 mcg Intravenous Given 01/21/18 2232)     Initial Impression / Assessment and Plan / ED Course  I have reviewed the triage vital signs and the nursing notes.  Pertinent labs & imaging results that were available during my care of the patient were reviewed by me and considered in my medical decision making (see chart for details).    Patient is an 62-year male with history of chronic atrial fibrillation on Coumadin, CAD with history of PCI,  history of prior CVA, aortic stenosis status post TAVR approximately 4 months ago who presents to the emergency department today for evaluation after ground level mechanical fall.    Pt afeb, HDS at presentation.  Alert and oriented.  No distress.  Reassuring abdominal exam.  Does have tenderness particular the left hip/buttocks.  CT head and cervical spine obtained given age and on anticoagulants.  No acute dyschromia normality.  No fracture or traumatic malalignment of the cervical spine.  No midline thoracic or lumbar spine pain. Mechanical fall with no extremity pain/swelling and no CP/palpitaitons/SOA/syncope.  X-rays of the chest and pelvis remarkable only for superior and inferior pubic ramus fracture.  CT of the abdomen/pelvis obtained without contrast given patient's renal function and confirm superior and inferior pubic ramus fracture.  No other pelvic or hip fractures appreciated.  Does have small volume hematomas in the pelvis.  Monitor the ED with no tachycardia or hypotension.  INR 1.5.  Does not necessitate reversal at this time. Discussed findings with medicine service who will admit for further pain control and therapy. These are often managed non-op and no indication at this time for emergent ortho eval therefore anticipate ortho eval in morning. Family updated throughout care and understanding and agreeable with admission. IV fentanyl given for pain. Stable in ED w/no acute events. Evaluated by medicine service in ED.   Case and plan of care discussed with Dr. Kathrynn Humble.   Final Clinical Impressions(s) / ED Diagnoses   Final diagnoses:  Fall, initial encounter  Closed fracture of left superior pubic ramus, initial encounter ([place])  Closed fracture of left inferior pubic ramus, initial encounter [facility])    ED Discharge Orders    None       Corrie Dandy, MD 01/22/18 [place], [place], MD 01/23/18 1302

## 2018-01-21 NOTE — ED Notes (Signed)
Pt. To CT via stretcher. 

## 2018-01-21 NOTE — H&P (Signed)
History and Physical    Wayne [name] EML:[id number] DOB: 01-Mar-1930 DOA: 01/21/2018  Referring MD/NP/PA:   PCP: [name], DO   Patient coming from:  The patient is coming from home.  At baseline, pt is partially dependent for most of ADL.       Chief Complaint: fall, and left hip and left pelvis pain  HPI: Wayne [name] is a 84 y.o. male with medical history significant of hypertension, stroke, aortic stenosis (s/p of TAVR with biovalve), MV stenosis, A fib on Coumadin, BPH, CAD, dCHF, who presents with fall, left hip and left pelvis pain.  Pt states that he accidentally fell when he was watching TV and turned to look into the window at home at about 5:00 PM. No LOC.  He developed severe pain in the left hip and left pelvis. The pain is constant, severe, sharp, radiating to the left leg slightly. No leg numbness. Patient denies any prodromal symptoms, no unilateral weakness, numbness or tingling his extremities, no chest pain, shortness of breath, cough, fever or chills.  Denies nausea, vomiting, diarrhea, abdominal pain, symptoms of UTI.  ED Course: pt was found to have WBC 8.4, hemoglobin 12.2-->11.5, INR 1.52, AKI with creatinine 1.23, BUN 17, temperature normal, slightly tachycardia, tachypnea, oxygen satting 94% on room air, chest x-ray showed cardiomegaly.  CT head is negative for acute intracranial abnormalities.  CT of the C-spine is negative for bony fracture.  Patient is admitted to telemetry bed as inpatient. Patient likely needs rehab placement.  CT abdomen/pelvis showed: 1. Mildly displaced and mildly comminuted fractures of the left superior and inferior pubic rami. 2. Soft tissue hemorrhage along the space of Retzius, anterior to the bladder, and tracking along the left pelvic sidewall, reflecting the overlying left pubic rami fractures. 3. Diffuse soft tissue hemorrhage along the left pectineus and adductor musculature. 4. Small bilateral pleural  effusions, with bibasilar atelectasis. 5. Diffuse coronary artery calcifications seen. 6. Small left renal cyst noted  C-ray of pelvis showed 1. Probable fractures of the left superior and inferior pubic rami, poorly evaluated on today's portable frontal views.   Review of Systems:   General: no fevers, chills, no body weight gain, has fatigue HEENT: no blurry vision, hearing changes or sore throat Respiratory: no dyspnea, coughing, wheezing CV: no chest pain, no palpitations GI: no nausea, vomiting, abdominal pain, diarrhea, constipation GU: no dysuria, burning on urination, increased urinary frequency, hematuria  Ext: has mild leg edema Neuro: no unilateral weakness, numbness, or tingling, no vision change or hearing loss. Had fall. Skin: no rash, no skin tear. MSK: has left hip and left pelvis pain. Heme: No easy bruising.  Travel history: No recent long distant travel.  Allergy:  Allergies  Allergen Reactions  . Penicillins Rash and Other (See Comments)    PATIENT HAS HAD A PCN REACTION WITH IMMEDIATE RASH, FACIAL/TONGUE/THROAT SWELLING, SOB, OR LIGHTHEADEDNESS WITH HYPOTENSION:  #  #  #  YES  #  #  #   Has patient had a PCN reaction causing severe rash involving mucus membranes or skin necrosis: No Has patient had a PCN reaction that required hospitalization No Has patient had a PCN reaction occurring within the last 10 years: No If all of the above answers are "NO", then may proceed with Cephalosporin use.  . Prednisone Other (See Comments)    Wheezing  . Procaine Hcl Other (See Comments)    Passed out after being given this at dental appointment  . Levofloxacin Rash  .  Oxycodone Other (See Comments)    constipation    Past Medical History:  Diagnosis Date  . Aortic stenosis   . Arthritis   . Atrial fibrillation (HCC)    Chronic  . Atrial fibrillation, chronic ([place])   . BPH (benign prostatic hyperplasia)   . CAD (coronary artery disease)   . Chronic  anticoagulation   . History of chicken pox   . HTN (hypertension)   . Mitral stenosis   . Old MI (myocardial infarction) 2007   s/p PCI to distal OM (no stent)  . Severe mitral regurgitation   . 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 CORONARY ANGIOPLASTY  2007   Distal OM  . PROSTATE BIOPSY    . RIGHT/LEFT HEART CATH AND CORONARY ANGIOGRAPHY N/A 08/01/2017   Procedure: RIGHT/LEFT HEART CATH AND CORONARY ANGIOGRAPHY;  Surgeon: Sherren Mocha, MD;  Location: [place] CV LAB;  Service: Cardiovascular;  Laterality: N/A;  . TEE WITHOUT CARDIOVERSION N/A 07/20/2017   Procedure: TRANSESOPHAGEAL ECHOCARDIOGRAM (TEE);  Surgeon: Lelon Perla, MD;  Location: [facility] ENDOSCOPY;  Service: Cardiovascular;  Laterality: N/A;  . TEE WITHOUT CARDIOVERSION N/A 09/04/2017   Procedure: TRANSESOPHAGEAL ECHOCARDIOGRAM (TEE);  Surgeon: Sherren Mocha, MD;  Location: [place];  Service: Open Heart Surgery;  Laterality: N/A;  . TRANSCATHETER AORTIC VALVE REPLACEMENT, TRANSFEMORAL N/A 09/04/2017   Procedure: TRANSCATHETER AORTIC VALVE REPLACEMENT, TRANSFEMORAL;  Surgeon: Sherren Mocha, MD;  Location: [place];  Service: Open Heart Surgery;  Laterality: N/A;    Social History:  reports that he has never smoked. He has never used smokeless tobacco. He reports that he does not drink alcohol or use drugs.  Family History:  Family History  Problem Relation Age of Onset  . Heart disease Mother   . Heart attack Mother   . Heart disease Father   . Heart attack Father   . Heart attack Brother   . Heart attack Sister   . Stroke Brother      Prior to Admission medications   Medication Sig Start Date End Date Taking? Authorizing Provider  acetaminophen (TYLENOL) 500 MG tablet Take 1,000 mg by mouth every 6 (six) hours as needed for moderate pain or headache.    [provider]  alfuzosin (UROXATRAL) 10 MG 24 hr tablet Take 10 mg  by mouth daily with breakfast.    [provider]  ARTIFICIAL TEAR OP Place 1 drop into both eyes daily as needed (dry eyes).    [provider]  cholecalciferol 2000 units TABS Take 1 tablet (2,000 Units total) by mouth daily. 05/24/17   Regalado, Belkys A, MD  clindamycin (CLEOCIN) 300 MG capsule Take 2 capsules (600 mg total) by mouth as directed. Take one hour prior to dental visits. 10/24/17   Sherren Mocha, MD  Dextromethorphan-Guaifenesin [facility] DM) 30-600 MG TB12 Take 1-2 tablets by mouth every 12 (twelve) hours as needed (for cough/congestion.).    [provider]  digoxin (LANOXIN) 0.125 MG tablet Take 1 tablet (125 mcg total) by mouth daily. Patient taking differently: Take 0.0625 mg by mouth daily.  09/18/17   Lendon Colonel, NP  finasteride (PROSCAR) 5 MG tablet Take 5 mg by mouth at bedtime.  02/14/17   Lelon Perla, MD  furosemide (LASIX) 40 MG tablet Take 1 tablet (40 mg total) by mouth daily. 09/18/17   Lendon Colonel, NP  Melatonin 1 MG TABS Take  5 mg by mouth at bedtime.     [provider]  Menthol, Topical Analgesic, (BIOFREEZE EX) Apply 1 application topically 3 (three) times daily as needed (muscle pain in back). alterrnates between [employer] and Blu Emu    [provider]  Menthol, Topical Analgesic, (BLUE-EMU MAXIMUM STRENGTH EX) Apply 1 application topically 3 (three) times daily as needed (muscle pain in back). alterrnates between [employer] and Blu Emu    [provider]  methocarbamol (ROBAXIN) 500 MG tablet Take 500 mg by mouth as needed (cramps).     [provider]  metoprolol tartrate (LOPRESSOR) 25 MG tablet Take 0.5 tablets (12.5 mg total) by mouth 2 (two) times daily. 09/18/17   Lendon Colonel, NP  potassium chloride (K-DUR,KLOR-CON) 10 MEQ tablet Take 1 tablet (10 mEq total) by mouth daily. 09/18/17   Lendon Colonel, NP  sodium chloride (OCEAN) 0.65 % SOLN nasal spray Place 1 spray  into both nostrils daily as needed for congestion.    [provider]  triamcinolone cream (KENALOG) 0.1 % Apply 1 application topically daily as needed (dry skin).     [provider]  warfarin (COUMADIN) 2.5 MG tablet Take 2.5 mg on Saturday, Tuesday and Thursday.  Take 5 mg on Sunday, Monday and Wednesday. Patient taking differently: Take 2.5 mg on Saturday, Tuesday and Thursday.  Take 5 mg on Sunday, Monday and Wednesday and Friday. 09/15/17   Elmarie Shiley, MD    Physical Exam: Vitals:   01/21/18 2330 01/22/18 0015 01/22/18 0204 01/22/18 0507  BP: 120/80 134/82 140/80 111/66  Pulse:   (!) 120 85  Resp: 18 (!) 25 20 20   Temp:   97.8 F (36.6 C) 98.5 F (36.9 C)  TempSrc:   Oral Oral  SpO2:   92% 91%  Weight:   79.9 kg (176 lb 2.4 oz)   Height:       General: Not in acute distress HEENT:       Eyes: PERRL, EOMI, no scleral icterus.       ENT: No discharge from the ears and nose, no pharynx injection, no tonsillar enlargement.        Neck: No JVD, no bruit, no mass felt. Heme: No neck lymph node enlargement. Cardiac: J2/I7, RRR, 2/6 systolic murmurs, No gallops or rubs. Respiratory: No rales, wheezing, rhonchi or rubs. GI: Soft, nondistended, nontender, no rebound pain, no organomegaly, BS present. GU: No hematuria Ext: has trace leg edema bilaterally. 2+DP/PT pulse bilaterally. Musculoskeletal: has left hip and left pelvis tenderness. Skin: No rashes.  Neuro: Alert, oriented X3, cranial nerves II-XII grossly intact, moves all extremities. Psych: Patient is not psychotic, no suicidal or hemocidal ideation.  Labs on Admission: I have personally reviewed following labs and imaging studies  CBC: Recent Labs  Lab 01/21/18 1949 01/22/18 0213  WBC 8.4 8.2  NEUTROABS 6.5  --   HGB 12.2* 11.5*  HCT 38.6* 35.6*  MCV 94.1 92.5  PLT 151 867*   Basic Metabolic Panel: Recent Labs  Lab 01/21/18 1949 01/22/18 0635  NA 141 140  K 4.1 4.1  CL 102 104    CO2 32 26  GLUCOSE 107* 125*  BUN 17 15  CREATININE 1.23 0.94  CALCIUM 9.0 8.5*   GFR: Estimated Creatinine Clearance: 59.2 mL/min (by C-G formula based on SCr of 0.94 mg/dL). Liver Function Tests: Recent Labs  Lab 01/21/18 1949  AST 24  ALT 14  ALKPHOS 39  BILITOT 1.0  PROT  6.6  ALBUMIN 3.7   No results for input(s): LIPASE, AMYLASE in the last 168 hours. No results for input(s): AMMONIA in the last 168 hours. Coagulation Profile: Recent Labs  Lab 01/21/18 1949  INR 1.52   Cardiac Enzymes: Recent Labs  Lab 01/22/18 0213  CKTOTAL 163   BNP (last 3 results) No results for input(s): PROBNP in the last 8760 hours. HbA1C: No results for input(s): HGBA1C in the last 72 hours. CBG: No results for input(s): GLUCAP in the last 168 hours. Lipid Profile: No results for input(s): CHOL, HDL, LDLCALC, TRIG, CHOLHDL, LDLDIRECT in the last 72 hours. Thyroid Function Tests: No results for input(s): TSH, T4TOTAL, FREET4, T3FREE, THYROIDAB in the last 72 hours. Anemia Panel: No results for input(s): VITAMINB12, FOLATE, FERRITIN, TIBC, IRON, RETICCTPCT in the last 72 hours. Urine analysis:    Component Value Date/Time   COLORURINE YELLOW 09/12/2017 [place] 09/12/2017 1514   LABSPEC 1.025 12/15/2017 1533   PHURINE 6.0 12/15/2017 1533   GLUCOSEU NEGATIVE 12/15/2017 1533   HGBUR NEGATIVE 12/15/2017 1533   BILIRUBINUR NEGATIVE 12/15/2017 1533   KETONESUR NEGATIVE 12/15/2017 1533   PROTEINUR NEGATIVE 12/15/2017 1533   UROBILINOGEN 0.2 12/15/2017 1533   NITRITE NEGATIVE 12/15/2017 1533   LEUKOCYTESUR NEGATIVE 12/15/2017 1533   Sepsis Labs: @LABRCNTIP (procalcitonin:4,lacticidven:4) )No results found for this or any previous visit (from the past 240 hour(s)).   Radiological Exams on Admission: Ct Abdomen Pelvis Wo Contrast  Result Date: 01/21/2018 CLINICAL DATA:  Status post fall backwards, with left pelvic tenderness. Initial encounter. EXAM: CT ABDOMEN  AND PELVIS WITHOUT CONTRAST TECHNIQUE: Multidetector CT imaging of the abdomen and pelvis was performed following the standard protocol without IV contrast. COMPARISON:  CTA of the abdomen and pelvis performed 08/06/2017 FINDINGS: Lower chest: Small bilateral pleural effusions are noted, with bibasilar atelectasis. A valve replacement is noted at the aortic valve. Diffuse coronary artery calcifications are seen. Hepatobiliary: The liver is unremarkable in appearance. The gallbladder is unremarkable in appearance. The common bile duct remains normal in caliber. Pancreas: The pancreas is within normal limits. Spleen: The spleen is unremarkable in appearance. Adrenals/Urinary Tract: The adrenal glands are unremarkable in appearance. A small left renal cyst is noted. Nonspecific perinephric stranding is noted bilaterally. There is no evidence of hydronephrosis. No renal or ureteral stones are identified. Stomach/Bowel: The stomach is unremarkable in appearance. The small bowel is within normal limits. The appendix is normal in caliber, without evidence of appendicitis. The colon is unremarkable in appearance. Vascular/Lymphatic: Diffuse calcification is seen along the abdominal aorta and its branches. The abdominal aorta is otherwise grossly unremarkable. The inferior vena cava is grossly unremarkable. No retroperitoneal lymphadenopathy is seen. No pelvic sidewall lymphadenopathy is identified. Reproductive: The bladder is mildly distended, with a left-sided diverticulum. The prostate is normal in size. Other: Soft tissue hemorrhage is noted along the space of Retzius, anterior to the bladder, and tracking along the left pelvic sidewall, reflecting the overlying left pubic rami fractures. There is also diffuse soft tissue hemorrhage along the left pectineus and adductor musculature. Musculoskeletal: There are mildly displaced and mildly comminuted fractures of the left superior and inferior pubic rami. No additional  fractures are seen. There is mild chronic loss of height at vertebral body L3. IMPRESSION: 1. Mildly displaced and mildly comminuted fractures of the left superior and inferior pubic rami. 2. Soft tissue hemorrhage along the space of Retzius, anterior to the bladder, and tracking along the left pelvic sidewall, reflecting the overlying left  pubic rami fractures. 3. Diffuse soft tissue hemorrhage along the left pectineus and adductor musculature. 4. Small bilateral pleural effusions, with bibasilar atelectasis. 5. Diffuse coronary artery calcifications seen. 6. Small left renal cyst noted. Aortic Atherosclerosis (ICD10-I70.0). These results were called by telephone at the time of interpretation on 01/21/2018 at 10:58 pm to Dr. Corrie Dandy , who verbally acknowledged these results. Electronically Signed   By: Garald Balding M.D.   On: 01/21/2018 23:00   Ct Head Wo Contrast  Result Date: 01/21/2018 CLINICAL DATA:  Fall EXAM: CT HEAD WITHOUT CONTRAST CT CERVICAL SPINE WITHOUT CONTRAST TECHNIQUE: Multidetector CT imaging of the head and cervical spine was performed following the standard protocol without intravenous contrast. Multiplanar CT image reconstructions of the cervical spine were also generated. COMPARISON:  05/21/2017 FINDINGS: CT HEAD FINDINGS Brain: Global atrophy. There are mild chronic ischemic changes in the periventricular white matter. There is no mass effect, midline shift, or acute hemorrhage. Vascular: No hyperdense vessel or unexpected calcification. Skull: Cranium is intact. Sinuses/Orbits: Mastoid air cells are clear. Visualized paranasal sinuses are clear. Orbits are within normal limits. Other: Noncontributory CT CERVICAL SPINE FINDINGS Alignment: There is anatomic alignment. Skull base and vertebrae: There is no vertebral compression deformity. No obvious fracture. No dislocation. Soft tissues and spinal canal: No obvious spinal canal hematoma. There is no obvious soft tissue hematoma in the  neck. Thyroid is unremarkable. Atherosclerotic calcifications of the carotids bilaterally. Disc levels: There are bridging anterior osteophytes throughout the cervical spine. There is also ankylosis of the posterior elements in the upper cervical spine. Left-sided uncovertebral osteophytes cause some left foraminal narrowing at C3-4. There are prominent posterior disc osteophytes at C4-5. This results in an element of central spinal stenosis. Left paracentral and foraminal osteophytes at C6-7 result in severe left foraminal narrowing. Upper chest: There is hazy ground-glass in the visualized left upper lobe. Other: Noncontributory IMPRESSION: No acute intracranial pathology. Chronic ischemic changes are noted. No evidence of cervical spine injury. Degenerative changes are noted above. Hazy ground-glass opacity in the left upper lobe. Electronically Signed   By: Marybelle Killings M.D.   On: 01/21/2018 20:02   Ct Cervical Spine Wo Contrast  Result Date: 01/21/2018 CLINICAL DATA:  Fall EXAM: CT HEAD WITHOUT CONTRAST CT CERVICAL SPINE WITHOUT CONTRAST TECHNIQUE: Multidetector CT imaging of the head and cervical spine was performed following the standard protocol without intravenous contrast. Multiplanar CT image reconstructions of the cervical spine were also generated. COMPARISON:  05/21/2017 FINDINGS: CT HEAD FINDINGS Brain: Global atrophy. There are mild chronic ischemic changes in the periventricular white matter. There is no mass effect, midline shift, or acute hemorrhage. Vascular: No hyperdense vessel or unexpected calcification. Skull: Cranium is intact. Sinuses/Orbits: Mastoid air cells are clear. Visualized paranasal sinuses are clear. Orbits are within normal limits. Other: Noncontributory CT CERVICAL SPINE FINDINGS Alignment: There is anatomic alignment. Skull base and vertebrae: There is no vertebral compression deformity. No obvious fracture. No dislocation. Soft tissues and spinal canal: No obvious spinal  canal hematoma. There is no obvious soft tissue hematoma in the neck. Thyroid is unremarkable. Atherosclerotic calcifications of the carotids bilaterally. Disc levels: There are bridging anterior osteophytes throughout the cervical spine. There is also ankylosis of the posterior elements in the upper cervical spine. Left-sided uncovertebral osteophytes cause some left foraminal narrowing at C3-4. There are prominent posterior disc osteophytes at C4-5. This results in an element of central spinal stenosis. Left paracentral and foraminal osteophytes at C6-7 result in severe left foraminal narrowing.  Upper chest: There is hazy ground-glass in the visualized left upper lobe. Other: Noncontributory IMPRESSION: No acute intracranial pathology. Chronic ischemic changes are noted. No evidence of cervical spine injury. Degenerative changes are noted above. Hazy ground-glass opacity in the left upper lobe. Electronically Signed   By: Marybelle Killings M.D.   On: 01/21/2018 20:02   Dg Pelvis Portable  Result Date: 01/21/2018 CLINICAL DATA:  84 year old male with history of trauma from a fall. Pain in the left hip. EXAM: PORTABLE PELVIS 1-2 VIEWS COMPARISON:  Left hip radiograph 09/24/2017. FINDINGS: Two portable views of the pelvis demonstrate what appears to be subtle discontinuity of the left superior and inferior pubic rami, concerning for minimally displaced fractures. Bilateral proximal femurs as visualized appear intact and the femoral heads appear located on these frontal projections. IMPRESSION: 1. Probable fractures of the left superior and inferior pubic rami, poorly evaluated on today's portable frontal views. These could be confirmed with noncontrast CT the pelvis if clinically appropriate. Electronically Signed   By: Vinnie Langton M.D.   On: 01/21/2018 19:40   Dg Chest Portable 1 View  Result Date: 01/21/2018 CLINICAL DATA:  Fall EXAM: PORTABLE CHEST 1 VIEW COMPARISON:  09/10/2017 FINDINGS: The heart remains  moderately enlarged. Aortic valve replacement hardware is stable in position. Normal vascularity. No pneumothorax or pleural effusion. There is no evidence of consolidation. IMPRESSION: Cardiomegaly without decompensation. Electronically Signed   By: Marybelle Killings M.D.   On: 01/21/2018 19:39     EKG: Independently reviewed.  Atrial flutter/atrial fibrillation, low voltage, LVH, nonspecific T wave change   Assessment/Plan Principal Problem:   Fall Active Problems:   Chronic atrial fibrillation (HCC)   HTN (hypertension)   CAD (coronary artery disease)   History of stroke   Chronic diastolic CHF (congestive heart failure) (HCC)   S/P TAVR (transcatheter aortic valve replacement)   Fracture of pubic ramus-left   Fall and Fracture of pubic ramus-left: pt had a mechanical fall. CT-head and CT of C-spine is negative for acute issues.  CT abdomen/pelvis showed mildly displaced and mildly comminuted fractures of the left superior and inferior pubic rami, and also soft tissue hemorrhage along the space of Retzius, anterior to the bladder, and tracking along the left pelvic sidewall. Hgb 12.2-->11.5. Pt has severe pain and will need rehab placement  -will admit to tele bed as inpt -pain control: prn Norco and morphine , prn robaxin -consult SW and CM  -pt/ot - hold couamdin due to soft tissue bleeding  Atrial Fibrillation: CHA2DS2-VASc Score is 8, needs oral anticoagulation. Patient is on Coumadin at home. INR is 1.52 on admission. Heart rate is 80-100s -Metoprolol, digoxin -check digoxin level -Hold Coumadin as above  HTN (hypertension):  -IV hydralazine as needed: -hold lasix due to AKI  CAD (coronary artery disease): no CP -on metoprolol  History of stroke: -hold coumadin as above  Chronic diastolic CHF (congestive heart failure) ([place]): 2D echo on 10/24/2017 showed EF 55-60%.  Patient has trace leg edema, but no JVD.  No respiratory distress.  CHF seems to be compensated. -Hold  Lasix due to AKI -check BNP today   DVT ppx: SCD Code Status: DNR (I discussed with patient in the presence of his wife, and explained the meaning of CODE STATUS. Patient wants to be DNR). Family Communication:     Yes, patient's wife at bed side Disposition Plan:  Anticipate discharge back to previous home environment Consults called:  none Admission status:  Inpatient/tele  Date of Service 01/22/2018    Ivor Costa [facility] Pager [phone]  If 7PM-7AM, please contact night-coverage [email or web address] Password [email or web address] 01/22/2018, 7:52 AM

## 2018-01-22 ENCOUNTER — Encounter (HOSPITAL_COMMUNITY): Payer: Self-pay | Admitting: Surgery

## 2018-01-22 ENCOUNTER — Other Ambulatory Visit: Payer: Self-pay

## 2018-01-22 DIAGNOSIS — Z881 Allergy status to other antibiotic agents status: Secondary | ICD-10-CM | POA: Diagnosis not present

## 2018-01-22 DIAGNOSIS — M25552 Pain in left hip: Secondary | ICD-10-CM | POA: Diagnosis not present

## 2018-01-22 DIAGNOSIS — Y92009 Unspecified place in unspecified non-institutional (private) residence as the place of occurrence of the external cause: Secondary | ICD-10-CM | POA: Diagnosis not present

## 2018-01-22 DIAGNOSIS — I252 Old myocardial infarction: Secondary | ICD-10-CM | POA: Diagnosis not present

## 2018-01-22 DIAGNOSIS — I2510 Atherosclerotic heart disease of native coronary artery without angina pectoris: Secondary | ICD-10-CM

## 2018-01-22 DIAGNOSIS — S32502D Unspecified fracture of left pubis, subsequent encounter for fracture with routine healing: Secondary | ICD-10-CM | POA: Diagnosis not present

## 2018-01-22 DIAGNOSIS — I2583 Coronary atherosclerosis due to lipid rich plaque: Secondary | ICD-10-CM

## 2018-01-22 DIAGNOSIS — R279 Unspecified lack of coordination: Secondary | ICD-10-CM | POA: Diagnosis not present

## 2018-01-22 DIAGNOSIS — S300XXA Contusion of lower back and pelvis, initial encounter: Secondary | ICD-10-CM | POA: Diagnosis present

## 2018-01-22 DIAGNOSIS — I951 Orthostatic hypotension: Secondary | ICD-10-CM | POA: Diagnosis not present

## 2018-01-22 DIAGNOSIS — W19XXXS Unspecified fall, sequela: Secondary | ICD-10-CM | POA: Diagnosis not present

## 2018-01-22 DIAGNOSIS — Z952 Presence of prosthetic heart valve: Secondary | ICD-10-CM

## 2018-01-22 DIAGNOSIS — R4182 Altered mental status, unspecified: Secondary | ICD-10-CM | POA: Diagnosis not present

## 2018-01-22 DIAGNOSIS — Z8673 Personal history of transient ischemic attack (TIA), and cerebral infarction without residual deficits: Secondary | ICD-10-CM | POA: Diagnosis not present

## 2018-01-22 DIAGNOSIS — D62 Acute posthemorrhagic anemia: Secondary | ICD-10-CM | POA: Diagnosis not present

## 2018-01-22 DIAGNOSIS — F05 Delirium due to known physiological condition: Secondary | ICD-10-CM | POA: Diagnosis not present

## 2018-01-22 DIAGNOSIS — I482 Chronic atrial fibrillation: Secondary | ICD-10-CM | POA: Diagnosis not present

## 2018-01-22 DIAGNOSIS — K5900 Constipation, unspecified: Secondary | ICD-10-CM | POA: Diagnosis not present

## 2018-01-22 DIAGNOSIS — S32592A Other specified fracture of left pubis, initial encounter for closed fracture: Principal | ICD-10-CM

## 2018-01-22 DIAGNOSIS — R69 Illness, unspecified: Secondary | ICD-10-CM | POA: Diagnosis not present

## 2018-01-22 DIAGNOSIS — M25512 Pain in left shoulder: Secondary | ICD-10-CM | POA: Diagnosis not present

## 2018-01-22 DIAGNOSIS — Z743 Need for continuous supervision: Secondary | ICD-10-CM | POA: Diagnosis not present

## 2018-01-22 DIAGNOSIS — Z66 Do not resuscitate: Secondary | ICD-10-CM | POA: Diagnosis not present

## 2018-01-22 DIAGNOSIS — N179 Acute kidney failure, unspecified: Secondary | ICD-10-CM | POA: Diagnosis not present

## 2018-01-22 DIAGNOSIS — I10 Essential (primary) hypertension: Secondary | ICD-10-CM | POA: Diagnosis not present

## 2018-01-22 DIAGNOSIS — N400 Enlarged prostate without lower urinary tract symptoms: Secondary | ICD-10-CM | POA: Diagnosis present

## 2018-01-22 DIAGNOSIS — Z880 Allergy status to penicillin: Secondary | ICD-10-CM | POA: Diagnosis not present

## 2018-01-22 DIAGNOSIS — G9341 Metabolic encephalopathy: Secondary | ICD-10-CM | POA: Diagnosis not present

## 2018-01-22 DIAGNOSIS — W19XXXA Unspecified fall, initial encounter: Secondary | ICD-10-CM

## 2018-01-22 DIAGNOSIS — N501 Vascular disorders of male genital organs: Secondary | ICD-10-CM | POA: Diagnosis not present

## 2018-01-22 DIAGNOSIS — S32592S Other specified fracture of left pubis, sequela: Secondary | ICD-10-CM | POA: Diagnosis not present

## 2018-01-22 DIAGNOSIS — G459 Transient cerebral ischemic attack, unspecified: Secondary | ICD-10-CM | POA: Diagnosis not present

## 2018-01-22 DIAGNOSIS — I5032 Chronic diastolic (congestive) heart failure: Secondary | ICD-10-CM | POA: Diagnosis not present

## 2018-01-22 DIAGNOSIS — W19XXXD Unspecified fall, subsequent encounter: Secondary | ICD-10-CM | POA: Diagnosis not present

## 2018-01-22 DIAGNOSIS — Z888 Allergy status to other drugs, medicaments and biological substances status: Secondary | ICD-10-CM | POA: Diagnosis not present

## 2018-01-22 DIAGNOSIS — I110 Hypertensive heart disease with heart failure: Secondary | ICD-10-CM | POA: Diagnosis not present

## 2018-01-22 DIAGNOSIS — W010XXA Fall on same level from slipping, tripping and stumbling without subsequent striking against object, initial encounter: Secondary | ICD-10-CM | POA: Diagnosis not present

## 2018-01-22 DIAGNOSIS — Z79899 Other long term (current) drug therapy: Secondary | ICD-10-CM | POA: Diagnosis not present

## 2018-01-22 DIAGNOSIS — Z9861 Coronary angioplasty status: Secondary | ICD-10-CM | POA: Diagnosis not present

## 2018-01-22 DIAGNOSIS — Z953 Presence of xenogenic heart valve: Secondary | ICD-10-CM | POA: Diagnosis not present

## 2018-01-22 DIAGNOSIS — S32592D Other specified fracture of left pubis, subsequent encounter for fracture with routine healing: Secondary | ICD-10-CM | POA: Diagnosis not present

## 2018-01-22 DIAGNOSIS — D696 Thrombocytopenia, unspecified: Secondary | ICD-10-CM | POA: Diagnosis not present

## 2018-01-22 DIAGNOSIS — Z885 Allergy status to narcotic agent status: Secondary | ICD-10-CM | POA: Diagnosis not present

## 2018-01-22 LAB — CK: Total CK: 163 U/L (ref 49–397)

## 2018-01-22 LAB — BASIC METABOLIC PANEL
Anion gap: 10 (ref 5–15)
BUN: 15 mg/dL (ref 8–23)
CALCIUM: 8.5 mg/dL — AB (ref 8.9–10.3)
CHLORIDE: 104 mmol/L (ref 98–111)
CO2: 26 mmol/L (ref 22–32)
CREATININE: 0.94 mg/dL (ref 0.61–1.24)
GFR calc non Af Amer: >60 mL/min (ref >60)
GLUCOSE: 125 mg/dL — AB (ref 70–99)
Potassium: 4.1 mmol/L (ref 3.5–5.1)
Sodium: 140 mmol/L (ref 135–145)

## 2018-01-22 LAB — BRAIN NATRIURETIC PEPTIDE: B Natriuretic Peptide: 271.8 pg/mL — ABNORMAL HIGH (ref 0.0–100.0)

## 2018-01-22 LAB — CBC
HEMATOCRIT: 35.6 % — AB (ref 39.0–52.0)
Hemoglobin: 11.5 g/dL — ABNORMAL LOW (ref 13.0–17.0)
MCH: 29.9 pg (ref 26.0–34.0)
MCHC: 32.3 g/dL (ref 30.0–36.0)
MCV: 92.5 fL (ref 78.0–100.0)
Platelets: 127 10*3/uL — ABNORMAL LOW (ref 150–400)
RBC: 3.85 MIL/uL — ABNORMAL LOW (ref 4.22–5.81)
RDW: 15.2 % (ref 11.5–15.5)
WBC: 8.2 10*3/uL (ref 4.0–10.5)

## 2018-01-22 LAB — DIGOXIN LEVEL: Digoxin Level: <0.2 ng/mL — ABNORMAL LOW (ref 0.8–2.0)

## 2018-01-22 MED ORDER — MUSCLE RUB 10-15 % EX CREA
TOPICAL_CREAM | Freq: Three times a day (TID) | CUTANEOUS | Status: DC | PRN
  Administered 2018-01-24 – 2018-01-25 (×2): via TOPICAL
  Filled 2018-01-22: qty 85

## 2018-01-22 MED ORDER — HYDROCODONE-ACETAMINOPHEN 5-325 MG PO TABS
1.0000 | ORAL_TABLET | ORAL | Status: DC | PRN
  Administered 2018-01-22 – 2018-01-23 (×4): 1 via ORAL
  Filled 2018-01-22 (×4): qty 1

## 2018-01-22 MED ORDER — SALINE SPRAY 0.65 % NA SOLN
1.0000 | Freq: Every day | NASAL | Status: DC | PRN
Start: 2018-01-22 — End: 2018-01-28
  Filled 2018-01-22: qty 44

## 2018-01-22 MED ORDER — TRIAMCINOLONE ACETONIDE 0.1 % EX CREA
1.0000 "application " | TOPICAL_CREAM | Freq: Every day | CUTANEOUS | Status: DC | PRN
  Filled 2018-01-22: qty 15

## 2018-01-22 MED ORDER — MORPHINE SULFATE (PF) 4 MG/ML IV SOLN
0.5000 mg | INTRAVENOUS | Status: DC | PRN
  Administered 2018-01-22 – 2018-01-23 (×3): 0.52 mg via INTRAVENOUS
  Filled 2018-01-22 (×3): qty 1

## 2018-01-22 MED ORDER — MELATONIN 3 MG PO TABS: 6.0000 mg | ORAL_TABLET | Freq: Every day | ORAL | Status: DC

## 2018-01-22 MED ORDER — ADULT MULTIVITAMIN W/MINERALS CH
1.0000 | ORAL_TABLET | Freq: Every day | ORAL | Status: DC
  Administered 2018-01-22 – 2018-01-28 (×7): 1 via ORAL
  Filled 2018-01-22 (×6): qty 1

## 2018-01-22 MED ORDER — POLYETHYLENE GLYCOL 3350 17 G PO PACK
17.0000 g | PACK | Freq: Every day | ORAL | Status: DC
  Administered 2018-01-22 – 2018-01-24 (×3): 17 g via ORAL
  Filled 2018-01-22 (×3): qty 1

## 2018-01-22 MED ORDER — FINASTERIDE 5 MG PO TABS
5.0000 mg | ORAL_TABLET | Freq: Every day | ORAL | Status: DC
  Administered 2018-01-22 – 2018-01-27 (×6): 5 mg via ORAL
  Filled 2018-01-22 (×6): qty 1

## 2018-01-22 MED ORDER — MELATONIN 3 MG PO TABS
6.0000 mg | ORAL_TABLET | Freq: Every day | ORAL | Status: DC
  Administered 2018-01-22 – 2018-01-27 (×6): 6 mg via ORAL
  Filled 2018-01-22 (×9): qty 2

## 2018-01-22 MED ORDER — DM-GUAIFENESIN ER 30-600 MG PO TB12
1.0000 | ORAL_TABLET | Freq: Two times a day (BID) | ORAL | Status: DC | PRN
  Filled 2018-01-22: qty 2

## 2018-01-22 MED ORDER — ZOLPIDEM TARTRATE 5 MG PO TABS: 5.0000 mg | ORAL_TABLET | Freq: Every evening | ORAL | Status: DC | PRN

## 2018-01-22 MED ORDER — METOPROLOL TARTRATE 12.5 MG HALF TABLET
12.5000 mg | ORAL_TABLET | Freq: Two times a day (BID) | ORAL | Status: DC
  Administered 2018-01-22 – 2018-01-28 (×13): 12.5 mg via ORAL
  Filled 2018-01-22 (×13): qty 1

## 2018-01-22 MED ORDER — HYDRALAZINE HCL 20 MG/ML IJ SOLN: 5.0000 mg | INTRAMUSCULAR | Status: DC | PRN

## 2018-01-22 MED ORDER — ALFUZOSIN HCL ER 10 MG PO TB24
10.0000 mg | ORAL_TABLET | Freq: Every day | ORAL | Status: DC
  Administered 2018-01-22 – 2018-01-28 (×7): 10 mg via ORAL
  Filled 2018-01-22 (×8): qty 1

## 2018-01-22 MED ORDER — DOCUSATE SODIUM 100 MG PO CAPS
100.0000 mg | ORAL_CAPSULE | Freq: Every evening | ORAL | Status: DC
  Administered 2018-01-22 – 2018-01-28 (×7): 100 mg via ORAL
  Filled 2018-01-22 (×8): qty 1

## 2018-01-22 MED ORDER — METHOCARBAMOL 500 MG PO TABS: 500.0000 mg | ORAL_TABLET | ORAL | Status: DC | PRN

## 2018-01-22 MED ORDER — ACETAMINOPHEN 325 MG PO TABS: 650.0000 mg | ORAL_TABLET | Freq: Four times a day (QID) | ORAL | Status: DC | PRN

## 2018-01-22 MED ORDER — FLEET ENEMA 7-19 GM/118ML RE ENEM: 1.0000 | ENEMA | Freq: Once | RECTAL | Status: DC | PRN

## 2018-01-22 MED ORDER — ONDANSETRON HCL 4 MG/2ML IJ SOLN: 4.0000 mg | Freq: Three times a day (TID) | INTRAMUSCULAR | Status: DC | PRN

## 2018-01-22 MED ORDER — MELATONIN 3 MG PO TABS
5.0000 mg | ORAL_TABLET | Freq: Every day | ORAL | Status: DC
  Filled 2018-01-22: qty 1.5

## 2018-01-22 MED ORDER — DIGOXIN 125 MCG PO TABS
125.0000 ug | ORAL_TABLET | Freq: Every day | ORAL | Status: DC
  Administered 2018-01-22 – 2018-01-28 (×7): 125 ug via ORAL
  Filled 2018-01-22 (×7): qty 1

## 2018-01-22 MED ORDER — VITAMIN D 1000 UNITS PO TABS
2000.0000 [IU] | ORAL_TABLET | Freq: Every day | ORAL | Status: DC
  Administered 2018-01-22 – 2018-01-28 (×7): 2000 [IU] via ORAL
  Filled 2018-01-22 (×8): qty 2

## 2018-01-22 NOTE — Consult Note (Signed)
Cardiology Consult    Patient ID: Wayne [name] MRN: [id number], DOB/AGE: Jan 25, 1930   Admit date: 01/21/2018 Date of Consult: 01/22/2018  Primary Physician: [name], DO Primary Cardiologist: Dr. Stanford Breed Requesting Provider: Dr. Loleta Books  Reason for Consultation: Fall on anticoagulation   Wayne [name] is a 84 y.o. male who is being seen today for the evaluation of a fall on anticoagulation at the request of Dr. Loleta Books.   Patient Profile    84 yo male with PMH of AS s/p TAVR, MR (non felt to be a candidate for mitraclip) Afib on coumadin, CAD s/p PCI OM who presented after a mechanical fall at home. Now with hematoma and fracture of the left pubic ramus.   Past Medical History   Past Medical History:  Diagnosis Date  . Aortic stenosis   . Arthritis   . Atrial fibrillation (HCC)    Chronic  . Atrial fibrillation, chronic ([place])   . BPH (benign prostatic hyperplasia)   . CAD (coronary artery disease)   . Chronic anticoagulation   . History of chicken pox   . HTN (hypertension)   . Mitral stenosis   . Old MI (myocardial infarction) 2007   s/p PCI to distal OM (no stent)  . Severe mitral regurgitation   . 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 CORONARY ANGIOPLASTY  2007   Distal OM  . PROSTATE BIOPSY    . RIGHT/LEFT HEART CATH AND CORONARY ANGIOGRAPHY N/A 08/01/2017   Procedure: RIGHT/LEFT HEART CATH AND CORONARY ANGIOGRAPHY;  Surgeon: Sherren Mocha, MD;  Location: [place] CV LAB;  Service: Cardiovascular;  Laterality: N/A;  . TEE WITHOUT CARDIOVERSION N/A 07/20/2017   Procedure: TRANSESOPHAGEAL ECHOCARDIOGRAM (TEE);  Surgeon: Lelon Perla, MD;  Location: [facility] ENDOSCOPY;  Service: Cardiovascular;  Laterality: N/A;  . TEE WITHOUT CARDIOVERSION N/A 09/04/2017   Procedure: TRANSESOPHAGEAL ECHOCARDIOGRAM (TEE);  Surgeon: Sherren Mocha, MD;  Location: [place];  Service:  Open Heart Surgery;  Laterality: N/A;  . TRANSCATHETER AORTIC VALVE REPLACEMENT, TRANSFEMORAL N/A 09/04/2017   Procedure: TRANSCATHETER AORTIC VALVE REPLACEMENT, TRANSFEMORAL;  Surgeon: Sherren Mocha, MD;  Location: [place];  Service: Open Heart Surgery;  Laterality: N/A;     Allergies  Allergies  Allergen Reactions  . Penicillins Rash and Other (See Comments)    PATIENT HAS HAD A PCN REACTION WITH IMMEDIATE RASH, FACIAL/TONGUE/THROAT SWELLING, SOB, OR LIGHTHEADEDNESS WITH HYPOTENSION:  #  #  #  YES  #  #  #   Has patient had a PCN reaction causing severe rash involving mucus membranes or skin necrosis: No Has patient had a PCN reaction that required hospitalization No Has patient had a PCN reaction occurring within the last 10 years: No If all of the above answers are "NO", then may proceed with Cephalosporin use.  . Prednisone Other (See Comments)    Wheezing  . Procaine Hcl Other (See Comments)    Passed out after being given this at dental appointment  . Levofloxacin Rash  . Oxycodone Other (See Comments)    constipation    History of Present Illness    Wayne [name] is an 84 yo male with PMH of AS s/p TAVR, MR (non felt to be a candidate for mitraclip) Afib on coumadin, CAD s/p PCI OM. He is followed by Dr. Stanford Breed as an outpatient. Was referred to the structural heart clinic and underwent cath in 2/19 that  showed mild nonobstructive disease with moderate pulmonary HTN and large V wanes consistent with severe MR. TEE at that time showed hypokinesis in the inferolateral wall with overall preserved LV functio, severe AS with mean gradient of 56 mmHg, severe MR and severe left atrial enlargement. Underwent TAVR 3/19. He is not felt to be a candidate for mitraclip for his MR. Last echo in 5/19 showed normal EF with stable valve placement with prolapse of the mitral valve/severe mitral regurgitation. He was last seen in the office on 12/31/17, reported being in his usual state of health  bedsides some weakness. He was continued on his medications, including coumadin without any change.  He currently lives at home and does most of his ADLs independently but family does help at times. States he was sitting watching TV in his [place] chair. Stood up to open the blinds and lost his balance landing on his left side. Denies any LOC, no dizziness, lightheadedness or palpitations prior to the fall. Developed severe left hip/pelvis pain after this fall. Brought to the ED for further evaluation.   In the ED his CT scans showed mildly comminuted fractures of the left superior and inferior pubic rami with a soft tissue hemorrhage around the anterior bladder tracking along the pelvic wall. Also noted soft tissue hemorrhage of the left pectineus and adductor musculature. Labs showed stable electrolytes, Cr 1.23, Hgb 12.2, INR 1.52, BNP 271. EKG showed Afib rate controlled. CXR neg. He was admitted to IM for further management. Seen by Ortho with plans to manage conservatively at this time. Still with significant pain in left hip. Did attempt to get up and work with PT today but blood pressure dropped in the 76H systolic and he was placed back on bedrest for now. Plan likely to dispo to SNF for rehab. Cardiology consulted in regards to management of his coumadin.  Inpatient Medications    . alfuzosin  10 mg Oral Q breakfast  . cholecalciferol  2,000 Units Oral Daily  . digoxin  125 mcg Oral Daily  . docusate sodium  100 mg Oral QPM  . finasteride  5 mg Oral QHS  . Melatonin  6 mg Oral QHS  . metoprolol tartrate  12.5 mg Oral BID  . multivitamin with minerals  1 tablet Oral Daily  . polyethylene glycol  17 g Oral Daily    Family History    Family History  Problem Relation Age of Onset  . Heart disease Mother   . Heart attack Mother   . Heart disease Father   . Heart attack Father   . Heart attack Brother   . Heart attack Sister   . Stroke Brother     Social History    Social History    Socioeconomic History  . Marital status: Married    Spouse name: Not on file  . Number of children: 3  . Years of education: 75  . Highest education level: Not on file  Occupational History  . Not on file  Social Needs  . Financial resource strain: Not on file  . Food insecurity:    Worry: Not on file    Inability: Not on file  . Transportation needs:    Medical: Not on file    Non-medical: Not on file  Tobacco Use  . Smoking status: Never Smoker  . Smokeless tobacco: Never Used  Substance and Sexual Activity  . Alcohol use: No  . Drug use: No  . Sexual activity: Not on file  Lifestyle  . Physical activity:    Days per week: Not on file    Minutes per session: Not on file  . Stress: Not on file  Relationships  . Social connections:    Talks on phone: Not on file    Gets together: Not on file    Attends religious service: Not on file    Active member of club or organization: Not on file    Attends meetings of clubs or organizations: Not on file    Relationship status: Not on file  . Intimate partner violence:    Fear of current or ex partner: Not on file    Emotionally abused: Not on file    Physically abused: Not on file    Forced sexual activity: Not on file  Other Topics Concern  . Not on file  Social History Narrative   Fun: Garden, work in the yard, anything physical and walk daily     Review of Systems    See HPI  All other systems reviewed and are otherwise negative except as noted above.  Physical Exam    Blood pressure 129/66, pulse 88, temperature 98.5 F (36.9 C), temperature source Oral, resp. rate 20, height 5' 10.5" (1.791 m), weight 176 lb 2.4 oz (79.9 kg), SpO2 91 %.  General: Pleasant, NAD, HOH Psych: Normal affect. Neuro: Alert and oriented X 3. Moves all extremities spontaneously. HEENT: Normal  Neck: Supple, no JVD. Lungs:  Resp regular and unlabored, CTA. Heart: Irreg, systolic murmur. Abdomen: Soft, non-tender, non-distended, BS  + x 4.  Extremities: No clubbing, cyanosis, mild LE L>R edema. DP/PT/Radials 2+ and equal bilaterally.  Labs    Troponin (Point of Care Test) No results for input(s): TROPIPOC in the last 72 hours. Recent Labs    01/22/18 0213  CKTOTAL 163   Lab Results  Component Value Date   WBC 8.2 01/22/2018   HGB 11.5 (L) 01/22/2018   HCT 35.6 (L) 01/22/2018   MCV 92.5 01/22/2018   PLT 127 (L) 01/22/2018    Recent Labs  Lab 01/21/18 1949 01/22/18 0635  NA 141 140  K 4.1 4.1  CL 102 104  CO2 32 26  BUN 17 15  CREATININE 1.23 0.94  CALCIUM 9.0 8.5*  PROT 6.6  --   BILITOT 1.0  --   ALKPHOS 39  --   ALT 14  --   AST 24  --   GLUCOSE 107* 125*   Lab Results  Component Value Date   CHOL 176 01/27/2016   HDL 29 (L) 01/27/2016   LDLCALC 92 01/27/2016   TRIG 274 (H) 01/27/2016   No results found for: [facility]   Radiology Studies    Ct Abdomen Pelvis Wo Contrast  Result Date: 01/21/2018 CLINICAL DATA:  Status post fall backwards, with left pelvic tenderness. Initial encounter. EXAM: CT ABDOMEN AND PELVIS WITHOUT CONTRAST TECHNIQUE: Multidetector CT imaging of the abdomen and pelvis was performed following the standard protocol without IV contrast. COMPARISON:  CTA of the abdomen and pelvis performed 08/06/2017 FINDINGS: Lower chest: Small bilateral pleural effusions are noted, with bibasilar atelectasis. A valve replacement is noted at the aortic valve. Diffuse coronary artery calcifications are seen. Hepatobiliary: The liver is unremarkable in appearance. The gallbladder is unremarkable in appearance. The common bile duct remains normal in caliber. Pancreas: The pancreas is within normal limits. Spleen: The spleen is unremarkable in appearance. Adrenals/Urinary Tract: The adrenal glands are unremarkable in appearance. A small left renal cyst is noted.  Nonspecific perinephric stranding is noted bilaterally. There is no evidence of hydronephrosis. No renal or ureteral stones are identified.  Stomach/Bowel: The stomach is unremarkable in appearance. The small bowel is within normal limits. The appendix is normal in caliber, without evidence of appendicitis. The colon is unremarkable in appearance. Vascular/Lymphatic: Diffuse calcification is seen along the abdominal aorta and its branches. The abdominal aorta is otherwise grossly unremarkable. The inferior vena cava is grossly unremarkable. No retroperitoneal lymphadenopathy is seen. No pelvic sidewall lymphadenopathy is identified. Reproductive: The bladder is mildly distended, with a left-sided diverticulum. The prostate is normal in size. Other: Soft tissue hemorrhage is noted along the space of Retzius, anterior to the bladder, and tracking along the left pelvic sidewall, reflecting the overlying left pubic rami fractures. There is also diffuse soft tissue hemorrhage along the left pectineus and adductor musculature. Musculoskeletal: There are mildly displaced and mildly comminuted fractures of the left superior and inferior pubic rami. No additional fractures are seen. There is mild chronic loss of height at vertebral body L3. IMPRESSION: 1. Mildly displaced and mildly comminuted fractures of the left superior and inferior pubic rami. 2. Soft tissue hemorrhage along the space of Retzius, anterior to the bladder, and tracking along the left pelvic sidewall, reflecting the overlying left pubic rami fractures. 3. Diffuse soft tissue hemorrhage along the left pectineus and adductor musculature. 4. Small bilateral pleural effusions, with bibasilar atelectasis. 5. Diffuse coronary artery calcifications seen. 6. Small left renal cyst noted. Aortic Atherosclerosis (ICD10-I70.0). These results were called by telephone at the time of interpretation on 01/21/2018 at 10:58 pm to Dr. Corrie Dandy , who verbally acknowledged these results. Electronically Signed   By: Garald Balding M.D.   On: 01/21/2018 23:00   Ct Head Wo Contrast  Result Date:  01/21/2018 CLINICAL DATA:  Fall EXAM: CT HEAD WITHOUT CONTRAST CT CERVICAL SPINE WITHOUT CONTRAST TECHNIQUE: Multidetector CT imaging of the head and cervical spine was performed following the standard protocol without intravenous contrast. Multiplanar CT image reconstructions of the cervical spine were also generated. COMPARISON:  05/21/2017 FINDINGS: CT HEAD FINDINGS Brain: Global atrophy. There are mild chronic ischemic changes in the periventricular white matter. There is no mass effect, midline shift, or acute hemorrhage. Vascular: No hyperdense vessel or unexpected calcification. Skull: Cranium is intact. Sinuses/Orbits: Mastoid air cells are clear. Visualized paranasal sinuses are clear. Orbits are within normal limits. Other: Noncontributory CT CERVICAL SPINE FINDINGS Alignment: There is anatomic alignment. Skull base and vertebrae: There is no vertebral compression deformity. No obvious fracture. No dislocation. Soft tissues and spinal canal: No obvious spinal canal hematoma. There is no obvious soft tissue hematoma in the neck. Thyroid is unremarkable. Atherosclerotic calcifications of the carotids bilaterally. Disc levels: There are bridging anterior osteophytes throughout the cervical spine. There is also ankylosis of the posterior elements in the upper cervical spine. Left-sided uncovertebral osteophytes cause some left foraminal narrowing at C3-4. There are prominent posterior disc osteophytes at C4-5. This results in an element of central spinal stenosis. Left paracentral and foraminal osteophytes at C6-7 result in severe left foraminal narrowing. Upper chest: There is hazy ground-glass in the visualized left upper lobe. Other: Noncontributory IMPRESSION: No acute intracranial pathology. Chronic ischemic changes are noted. No evidence of cervical spine injury. Degenerative changes are noted above. Hazy ground-glass opacity in the left upper lobe. Electronically Signed   By: Marybelle Killings M.D.   On:  01/21/2018 20:02   Ct Cervical Spine Wo Contrast  Result Date: 01/21/2018 CLINICAL DATA:  Fall EXAM: CT HEAD WITHOUT CONTRAST CT CERVICAL SPINE WITHOUT CONTRAST TECHNIQUE: Multidetector CT imaging of the head and cervical spine was performed following the standard protocol without intravenous contrast. Multiplanar CT image reconstructions of the cervical spine were also generated. COMPARISON:  05/21/2017 FINDINGS: CT HEAD FINDINGS Brain: Global atrophy. There are mild chronic ischemic changes in the periventricular white matter. There is no mass effect, midline shift, or acute hemorrhage. Vascular: No hyperdense vessel or unexpected calcification. Skull: Cranium is intact. Sinuses/Orbits: Mastoid air cells are clear. Visualized paranasal sinuses are clear. Orbits are within normal limits. Other: Noncontributory CT CERVICAL SPINE FINDINGS Alignment: There is anatomic alignment. Skull base and vertebrae: There is no vertebral compression deformity. No obvious fracture. No dislocation. Soft tissues and spinal canal: No obvious spinal canal hematoma. There is no obvious soft tissue hematoma in the neck. Thyroid is unremarkable. Atherosclerotic calcifications of the carotids bilaterally. Disc levels: There are bridging anterior osteophytes throughout the cervical spine. There is also ankylosis of the posterior elements in the upper cervical spine. Left-sided uncovertebral osteophytes cause some left foraminal narrowing at C3-4. There are prominent posterior disc osteophytes at C4-5. This results in an element of central spinal stenosis. Left paracentral and foraminal osteophytes at C6-7 result in severe left foraminal narrowing. Upper chest: There is hazy ground-glass in the visualized left upper lobe. Other: Noncontributory IMPRESSION: No acute intracranial pathology. Chronic ischemic changes are noted. No evidence of cervical spine injury. Degenerative changes are noted above. Hazy ground-glass opacity in the left  upper lobe. Electronically Signed   By: Marybelle Killings M.D.   On: 01/21/2018 20:02   Dg Pelvis Portable  Result Date: 01/21/2018 CLINICAL DATA:  84 year old male with history of trauma from a fall. Pain in the left hip. EXAM: PORTABLE PELVIS 1-2 VIEWS COMPARISON:  Left hip radiograph 09/24/2017. FINDINGS: Two portable views of the pelvis demonstrate what appears to be subtle discontinuity of the left superior and inferior pubic rami, concerning for minimally displaced fractures. Bilateral proximal femurs as visualized appear intact and the femoral heads appear located on these frontal projections. IMPRESSION: 1. Probable fractures of the left superior and inferior pubic rami, poorly evaluated on today's portable frontal views. These could be confirmed with noncontrast CT the pelvis if clinically appropriate. Electronically Signed   By: Vinnie Langton M.D.   On: 01/21/2018 19:40   Dg Chest Portable 1 View  Result Date: 01/21/2018 CLINICAL DATA:  Fall EXAM: PORTABLE CHEST 1 VIEW COMPARISON:  09/10/2017 FINDINGS: The heart remains moderately enlarged. Aortic valve replacement hardware is stable in position. Normal vascularity. No pneumothorax or pleural effusion. There is no evidence of consolidation. IMPRESSION: Cardiomegaly without decompensation. Electronically Signed   By: Marybelle Killings M.D.   On: 01/21/2018 19:39    ECG & Cardiac Imaging    EKG:  The EKG was personally reviewed and demonstrates Afib rate controlled.   Echo: 10/24/17  Study Conclusions  - Left ventricle: The cavity size was normal. There was mild   concentric hypertrophy. Systolic function was normal. The   estimated ejection fraction was in the range of 55% to 60%. Wall   motion was normal; there were no regional wall motion   abnormalities. - Aortic valve: A TAVR Edwards Sapien 3 THV (size 26 mm, model #   [id number], serial # [id number] was present. There was   trivial perivalvular regurgitation. - Mitral valve:  Moderately calcified annulus. Mildly thickened,   moderately calcified leaflets . Moderate prolapse, involving the   posterior leaflet,  partially flail. The findings are consistent   with moderate stenosis. There was severe regurgitation directed   eccentrically and posteriorly. Mean gradient (D): 9 mm Hg. - Left atrium: The atrium was severely dilated. - Right atrium: The atrium was severely dilated. - Tricuspid valve: There was mild regurgitation. - Pulmonary arteries: Systolic pressure was mildly increased. PA   peak pressure: 42 mm Hg (S).  Impressions:  - No significant change in mitral regurgitation.  Assessment & Plan    84 yo male with PMH of AS s/p TAVR, MR (non felt to be a candidate for mitraclip) Afib on coumadin, CAD s/p PCI OM who presented after a mechanical fall at home. Now with hematoma and fracture of the left pubic ramus.   1. Fall with left pubis ramus fx with hemorrhage around anterior bladder and pelvic wall: Reports a mechanical fall while getting up from his chair. No LOC. Seen by ortho with plans to manage conservatively at this time. Attempted to work with PT with dropped his systolic pressure to 70 with standing.   2. Afib: He has been on coumadin prior to admission. INR actually subtherapeutic at 1.52 on admission. Coumadin held on admission. He is in Afib but rate controlled. Given he dropped his blood pressure this afternoon with PT, would recheck CBC and plan to resume coumadin in the morning as long as Hgb stable.   3. AS s/p TAVR: followed by Dr. Burt Knack. Has been doing well with this as an outpatient.   Barnet Pall, NP-C Pager [phone] 01/22/2018, 2:41 PM

## 2018-01-22 NOTE — Progress Notes (Addendum)
PROGRESS NOTE    Wayne [name]  QQV:[id number] DOB: 1930/04/24 DOA: 01/21/2018 PCP: [name], DO      Brief Narrative:  Wayne [name] is a 84 y.o. M with Afib on warfarin, AS s/p AVR bioprosthetic, HTN, coronary disease and hx of stroke who presents with fall and hip pain.  CT pelvis in ER shows inferior and superior pubic rami fractures as well as small hematomas.  Admitted for anticoagulation management in setting of hematomas.   Assessment & Plan:  Pelvic hematoma Acute blood loss anemia Hemodynamically stable overnight.  Only small initial drop in hemoglobin.  Orthostatic today but no significant tachycardia. -Trend CBC tomorrow AM   Left minimally displaced superior and inferior pubic ramus fractures Consult to Orthopedics, appreciate cares -WBAT  -Pain control with Norco or Morphine -Consult Orthopedics, appreciate cares -Will need outpatient follow up with Orthopedics, Dr. Veverly Fells  Orthostatic hypotension -Push fluids -PT eval  Atrial fibrillation, chronic CHA2DS2-Vasc 5 (age, HTN, stroke).  On Warfarin, INR subtherapeutic, warfarin held at admission.  Given history of stroke, and relative stability of pelvic hematomas normally, would favor restarting warfarin -Consult Cardiology re: stroke risk -Trend Hgb -Continue metoprolol, digoxin  History of TAVR Chronic mitral regurgitation  Coronary disease -Continue BB  Other medications -Continue alfuzosin -Continue finasteride         DVT prophylaxis: N/A on warfarin Code Status: DO NOT RESUSCITATE Family Communication: Wife and son at bedside MDM and disposition Plan: The below labs and imaging reports were reviewed and summarized above.  Medication management, including holding anticoagulant, as outlined above.  The patient was admitted with Pelvic fracture, hematoma.  Given his chronic atrial fibrillation, risk for hemodynamically significant or life-threatening bleeding from his pelvic  hematoma in the context of anticoagulation, the risk of resuming anticoagulation as an outpatient without further monitoring in the hospital is too high.   Trend Hgb, PT eval.         Consultants:   Cardiology  Orthopedics  Procedures:   None  Antimicrobials:   None    Subjective: Feeling okay.  Left leg and outer hip pain.  Feels dizzy with standing.  No confusion, passing out.  No chest pain, palpiations.  Objective: Vitals:   01/22/18 1113 01/22/18 1544 01/22/18 1834 01/22/18 2025  BP: 129/66 109/61 (!) 110/57 115/61  Pulse: 88 75 93 96  Resp:    20  Temp:    98.9 F (37.2 C)  TempSrc:    Oral  SpO2:    94%  Weight:      Height:       No intake or output data in the 24 hours ending 01/22/18 2142 Filed Weights   01/21/18 1843 01/22/18 0204  Weight: 79.8 kg (176 lb) 79.9 kg (176 lb 2.4 oz)    Examination: General appearance: thin edlderly adult male, alert and in no acute distress.  Lying in bed HEENT: Anicteric, conjunctiva pink, lids and lashes normal. No nasal deformity, discharge, epistaxis.  Lips moist.   Skin: Warm and dry.  no jaundice.  No suspicious rashes or lesions. Cardiac: RRR, nl S1-S2, no murmurs appreciated.  Capillary refill is brisk.  JVP not visible.  No LE edema.  Radia  pulses 2+ and symmetric. Respiratory: Normal respiratory rate and rhythm.  CTAB without rales or wheezes. Abdomen: Abdomen soft.  no TTP. No ascites, distension, hepatosplenomegaly.   MSK: No deformities or effusions, but he guards the left leg, also right leg movement is somewhat limited Neuro: Awake and alert.  EOMI, moves all extremities. Speech fluent.    Psych: Sensorium intact and responding to questions, attention normal. Affect normal.  Judgment and insight appear normal.    Data Reviewed: I have personally reviewed following labs and imaging studies:  CBC: Recent Labs  Lab 01/21/18 1949 01/22/18 0213  WBC 8.4 8.2  NEUTROABS 6.5  --   HGB 12.2* 11.5*    HCT 38.6* 35.6*  MCV 94.1 92.5  PLT 151 025*   Basic Metabolic Panel: Recent Labs  Lab 01/21/18 1949 01/22/18 0635  NA 141 140  K 4.1 4.1  CL 102 104  CO2 32 26  GLUCOSE 107* 125*  BUN 17 15  CREATININE 1.23 0.94  CALCIUM 9.0 8.5*   GFR: Estimated Creatinine Clearance: 59.2 mL/min (by C-G formula based on SCr of 0.94 mg/dL). Liver Function Tests: Recent Labs  Lab 01/21/18 1949  AST 24  ALT 14  ALKPHOS 39  BILITOT 1.0  PROT 6.6  ALBUMIN 3.7   No results for input(s): LIPASE, AMYLASE in the last 168 hours. No results for input(s): AMMONIA in the last 168 hours. Coagulation Profile: Recent Labs  Lab 01/21/18 1949  INR 1.52   Cardiac Enzymes: Recent Labs  Lab 01/22/18 0213  CKTOTAL 163   BNP (last 3 results) No results for input(s): PROBNP in the last 8760 hours. HbA1C: No results for input(s): HGBA1C in the last 72 hours. CBG: No results for input(s): GLUCAP in the last 168 hours. Lipid Profile: No results for input(s): CHOL, HDL, LDLCALC, TRIG, CHOLHDL, LDLDIRECT in the last 72 hours. Thyroid Function Tests: No results for input(s): TSH, T4TOTAL, FREET4, T3FREE, THYROIDAB in the last 72 hours. Anemia Panel: No results for input(s): VITAMINB12, FOLATE, FERRITIN, TIBC, IRON, RETICCTPCT in the last 72 hours. Urine analysis:    Component Value Date/Time   COLORURINE YELLOW 09/12/2017 [place] 09/12/2017 1514   LABSPEC 1.025 12/15/2017 1533   PHURINE 6.0 12/15/2017 1533   GLUCOSEU NEGATIVE 12/15/2017 1533   HGBUR NEGATIVE 12/15/2017 1533   BILIRUBINUR NEGATIVE 12/15/2017 1533   KETONESUR NEGATIVE 12/15/2017 1533   PROTEINUR NEGATIVE 12/15/2017 1533   UROBILINOGEN 0.2 12/15/2017 1533   NITRITE NEGATIVE 12/15/2017 1533   LEUKOCYTESUR NEGATIVE 12/15/2017 1533   Sepsis Labs: @LABRCNTIP (procalcitonin:4,lacticacidven:4)  )No results found for this or any previous visit (from the past 240 hour(s)).       Radiology Studies: Ct  Abdomen Pelvis Wo Contrast  Result Date: 01/21/2018 CLINICAL DATA:  Status post fall backwards, with left pelvic tenderness. Initial encounter. EXAM: CT ABDOMEN AND PELVIS WITHOUT CONTRAST TECHNIQUE: Multidetector CT imaging of the abdomen and pelvis was performed following the standard protocol without IV contrast. COMPARISON:  CTA of the abdomen and pelvis performed 08/06/2017 FINDINGS: Lower chest: Small bilateral pleural effusions are noted, with bibasilar atelectasis. A valve replacement is noted at the aortic valve. Diffuse coronary artery calcifications are seen. Hepatobiliary: The liver is unremarkable in appearance. The gallbladder is unremarkable in appearance. The common bile duct remains normal in caliber. Pancreas: The pancreas is within normal limits. Spleen: The spleen is unremarkable in appearance. Adrenals/Urinary Tract: The adrenal glands are unremarkable in appearance. A small left renal cyst is noted. Nonspecific perinephric stranding is noted bilaterally. There is no evidence of hydronephrosis. No renal or ureteral stones are identified. Stomach/Bowel: The stomach is unremarkable in appearance. The small bowel is within normal limits. The appendix is normal in caliber, without evidence of appendicitis. The colon is unremarkable in appearance. Vascular/Lymphatic: Diffuse calcification is  seen along the abdominal aorta and its branches. The abdominal aorta is otherwise grossly unremarkable. The inferior vena cava is grossly unremarkable. No retroperitoneal lymphadenopathy is seen. No pelvic sidewall lymphadenopathy is identified. Reproductive: The bladder is mildly distended, with a left-sided diverticulum. The prostate is normal in size. Other: Soft tissue hemorrhage is noted along the space of Retzius, anterior to the bladder, and tracking along the left pelvic sidewall, reflecting the overlying left pubic rami fractures. There is also diffuse soft tissue hemorrhage along the left pectineus and  adductor musculature. Musculoskeletal: There are mildly displaced and mildly comminuted fractures of the left superior and inferior pubic rami. No additional fractures are seen. There is mild chronic loss of height at vertebral body L3. IMPRESSION: 1. Mildly displaced and mildly comminuted fractures of the left superior and inferior pubic rami. 2. Soft tissue hemorrhage along the space of Retzius, anterior to the bladder, and tracking along the left pelvic sidewall, reflecting the overlying left pubic rami fractures. 3. Diffuse soft tissue hemorrhage along the left pectineus and adductor musculature. 4. Small bilateral pleural effusions, with bibasilar atelectasis. 5. Diffuse coronary artery calcifications seen. 6. Small left renal cyst noted. Aortic Atherosclerosis (ICD10-I70.0). These results were called by telephone at the time of interpretation on 01/21/2018 at 10:58 pm to Dr. Corrie Dandy , who verbally acknowledged these results. Electronically Signed   By: Garald Balding M.D.   On: 01/21/2018 23:00   Ct Head Wo Contrast  Result Date: 01/21/2018 CLINICAL DATA:  Fall EXAM: CT HEAD WITHOUT CONTRAST CT CERVICAL SPINE WITHOUT CONTRAST TECHNIQUE: Multidetector CT imaging of the head and cervical spine was performed following the standard protocol without intravenous contrast. Multiplanar CT image reconstructions of the cervical spine were also generated. COMPARISON:  05/21/2017 FINDINGS: CT HEAD FINDINGS Brain: Global atrophy. There are mild chronic ischemic changes in the periventricular white matter. There is no mass effect, midline shift, or acute hemorrhage. Vascular: No hyperdense vessel or unexpected calcification. Skull: Cranium is intact. Sinuses/Orbits: Mastoid air cells are clear. Visualized paranasal sinuses are clear. Orbits are within normal limits. Other: Noncontributory CT CERVICAL SPINE FINDINGS Alignment: There is anatomic alignment. Skull base and vertebrae: There is no vertebral compression  deformity. No obvious fracture. No dislocation. Soft tissues and spinal canal: No obvious spinal canal hematoma. There is no obvious soft tissue hematoma in the neck. Thyroid is unremarkable. Atherosclerotic calcifications of the carotids bilaterally. Disc levels: There are bridging anterior osteophytes throughout the cervical spine. There is also ankylosis of the posterior elements in the upper cervical spine. Left-sided uncovertebral osteophytes cause some left foraminal narrowing at C3-4. There are prominent posterior disc osteophytes at C4-5. This results in an element of central spinal stenosis. Left paracentral and foraminal osteophytes at C6-7 result in severe left foraminal narrowing. Upper chest: There is hazy ground-glass in the visualized left upper lobe. Other: Noncontributory IMPRESSION: No acute intracranial pathology. Chronic ischemic changes are noted. No evidence of cervical spine injury. Degenerative changes are noted above. Hazy ground-glass opacity in the left upper lobe. Electronically Signed   By: Marybelle Killings M.D.   On: 01/21/2018 20:02   Ct Cervical Spine Wo Contrast  Result Date: 01/21/2018 CLINICAL DATA:  Fall EXAM: CT HEAD WITHOUT CONTRAST CT CERVICAL SPINE WITHOUT CONTRAST TECHNIQUE: Multidetector CT imaging of the head and cervical spine was performed following the standard protocol without intravenous contrast. Multiplanar CT image reconstructions of the cervical spine were also generated. COMPARISON:  05/21/2017 FINDINGS: CT HEAD FINDINGS Brain: Global atrophy. There are  mild chronic ischemic changes in the periventricular white matter. There is no mass effect, midline shift, or acute hemorrhage. Vascular: No hyperdense vessel or unexpected calcification. Skull: Cranium is intact. Sinuses/Orbits: Mastoid air cells are clear. Visualized paranasal sinuses are clear. Orbits are within normal limits. Other: Noncontributory CT CERVICAL SPINE FINDINGS Alignment: There is anatomic  alignment. Skull base and vertebrae: There is no vertebral compression deformity. No obvious fracture. No dislocation. Soft tissues and spinal canal: No obvious spinal canal hematoma. There is no obvious soft tissue hematoma in the neck. Thyroid is unremarkable. Atherosclerotic calcifications of the carotids bilaterally. Disc levels: There are bridging anterior osteophytes throughout the cervical spine. There is also ankylosis of the posterior elements in the upper cervical spine. Left-sided uncovertebral osteophytes cause some left foraminal narrowing at C3-4. There are prominent posterior disc osteophytes at C4-5. This results in an element of central spinal stenosis. Left paracentral and foraminal osteophytes at C6-7 result in severe left foraminal narrowing. Upper chest: There is hazy ground-glass in the visualized left upper lobe. Other: Noncontributory IMPRESSION: No acute intracranial pathology. Chronic ischemic changes are noted. No evidence of cervical spine injury. Degenerative changes are noted above. Hazy ground-glass opacity in the left upper lobe. Electronically Signed   By: Marybelle Killings M.D.   On: 01/21/2018 20:02   Dg Pelvis Portable  Result Date: 01/21/2018 CLINICAL DATA:  84 year old male with history of trauma from a fall. Pain in the left hip. EXAM: PORTABLE PELVIS 1-2 VIEWS COMPARISON:  Left hip radiograph 09/24/2017. FINDINGS: Two portable views of the pelvis demonstrate what appears to be subtle discontinuity of the left superior and inferior pubic rami, concerning for minimally displaced fractures. Bilateral proximal femurs as visualized appear intact and the femoral heads appear located on these frontal projections. IMPRESSION: 1. Probable fractures of the left superior and inferior pubic rami, poorly evaluated on today's portable frontal views. These could be confirmed with noncontrast CT the pelvis if clinically appropriate. Electronically Signed   By: Vinnie Langton M.D.   On:  01/21/2018 19:40   Dg Chest Portable 1 View  Result Date: 01/21/2018 CLINICAL DATA:  Fall EXAM: PORTABLE CHEST 1 VIEW COMPARISON:  09/10/2017 FINDINGS: The heart remains moderately enlarged. Aortic valve replacement hardware is stable in position. Normal vascularity. No pneumothorax or pleural effusion. There is no evidence of consolidation. IMPRESSION: Cardiomegaly without decompensation. Electronically Signed   By: Marybelle Killings M.D.   On: 01/21/2018 19:39        Scheduled Meds: . alfuzosin  10 mg Oral Q breakfast  . cholecalciferol  2,000 Units Oral Daily  . digoxin  125 mcg Oral Daily  . docusate sodium  100 mg Oral QPM  . finasteride  5 mg Oral QHS  . Melatonin  6 mg Oral QHS  . metoprolol tartrate  12.5 mg Oral BID  . multivitamin with minerals  1 tablet Oral Daily  . polyethylene glycol  17 g Oral Daily   Continuous Infusions:   LOS: 0 days    Time spent: 25 minutes    Edwin Dada, MD [facility] 01/22/2018, 9:42 PM     Pager [phone] --- please page though AMION:  [email or web address] Password [email or web address] If 7PM-7AM, please contact night-coverage

## 2018-01-22 NOTE — Evaluation (Signed)
Occupational Therapy Evaluation Patient Details Name: Wayne [name] MRN: [id number] DOB: [birth year] Today's Date: 01/22/2018    History of Present Illness Pt is a 84 y/o male admitted after falling at home with no LOC c/o severe pain in L hip and pelvis. CT showed mildly displaced and mildly comminuted fractures of L superior and inferior pubic rami and soft tissue hemorrhage, small bilateral pleural effusions. PMH significant for but not limited to: HTN, stroke, aortic stenosis, MV stenosis, A fib on coumadin, BPH, CAD, CHF.     Clinical Impression   PTA patient reports independent with ADLs, completing limited IADLs, using RW/cane for mobility.  Patient currently requires total assistance for LB ADLs from bed level, min assist for UB ADLs seated EOB, and max to total assistance +2 for bed mobility.  Evaluation limited today due to hypotensive seated EOB, BP dropping to 78/48 and returning to 95/67 once supine; RN notified.  Transfers not attempted today, but anticipate will need +2 assist.  Patient significantly limited by pain, guarding against mobility.  Patient educated of safety, mobility.  Based on performance today, recommend continued OT services while admitted and after dc at SNF.  Will continue to follow.     Follow Up Recommendations  SNF;Supervision/Assistance - 24 hour    Equipment Recommendations  Other (comment)(TBD at next venue of care)    Recommendations for Other Services PT consult     Precautions / Restrictions Precautions Precautions: Fall Restrictions Weight Bearing Restrictions: Yes LLE Weight Bearing: Weight bearing as tolerated      Mobility Bed Mobility Overal bed mobility: Needs Assistance Bed Mobility: Rolling;Supine to Sit;Sit to Supine Rolling: Max assist   Supine to sit: Max assist;+2 for physical assistance Sit to supine: +2 for physical assistance;Total assist   General bed mobility comments: patient requires greatly increased time and  effort, supine to EOB patient able to manage R LE only, pushing against therapist due to pain; and EOB to supine patient required total assist (son in law assisted therapist )  Transfers Overall transfer level: Needs assistance               General transfer comment: unable to attempt due to hypotensive sitting EOB; anticipate +2 assist     Balance Overall balance assessment: Needs assistance Sitting-balance support: Bilateral upper extremity supported;Feet supported Sitting balance-Leahy Scale: Fair Sitting balance - Comments: min guard for safety   Standing balance support: (NA)                               ADL either performed or assessed with clinical judgement   ADL Overall ADL's : Needs assistance/impaired Eating/Feeding: Set up;Bed level   Grooming: Set up;Bed level   Upper Body Bathing: Minimal assistance;Sitting   Lower Body Bathing: Total assistance;Bed level   Upper Body Dressing : Minimal assistance;Sitting   Lower Body Dressing: Total assistance;Bed level;+2 for physical assistance       Toileting- Clothing Manipulation and Hygiene: Bed level;Total assistance;+2 for physical assistance       Functional mobility during ADLs: Maximal assistance;+2 for physical assistance(bed mobility only) General ADL Comments: Eval limited due to pain and hypotension once seated EOB.      Vision Baseline Vision/History: Wears glasses Wears Glasses: Reading only Vision Assessment?: No apparent visual deficits     Perception     Praxis      Pertinent Vitals/Pain Pain Assessment: Faces Faces Pain Scale: Hurts whole lot Pain  Location: L LE, pelvis Pain Descriptors / Indicators: Discomfort;Grimacing;Guarding Pain Intervention(s): Limited activity within patient's tolerance;Monitored during session;Repositioned     Hand Dominance Right   Extremity/Trunk Assessment Upper Extremity Assessment Upper Extremity Assessment: LUE  deficits/detail;Generalized weakness LUE Deficits / Details: L shoulder AROM limited to 90 degrees FF, pt guarding against PROM over 90 degrees FF; distal to shoulder WFL (reports pain since fall ) LUE: Unable to fully assess due to pain LUE Sensation: WNL LUE Coordination: WNL   Lower Extremity Assessment Lower Extremity Assessment: Defer to PT evaluation       Communication Communication Communication: HOH   Cognition Arousal/Alertness: Awake/alert Behavior During Therapy: WFL for tasks assessed/performed Overall Cognitive Status: Within Functional Limits for tasks assessed                                     General Comments  eval limited due to hypotensive once sitting EOB, patients spouse and son in law present    Exercises     Shoulder Instructions      Home Living Family/patient expects to be discharged to:: Private residence Living Arrangements: Spouse/significant other Available Help at Discharge: Family;Available 24 hours/day Type of Home: House Home Access: Stairs to enter [employer] of Steps: 2 Entrance Stairs-Rails: Left Home Layout: Multi-level;Bed/bath upstairs(split level) Alternate Level Stairs-Number of Steps: 8-10 Alternate Level Stairs-Rails: Left;Right Bathroom Shower/Tub: Occupational psychologist: Standard   How Accessible: (unsure if walker would fit in bathroom) Home Equipment: Walker - 2 wheels;Walker - 4 wheels;Cane - single point;Tub bench;Bedside commode;Grab bars - toilet;Grab bars - tub/shower   Additional Comments: completes shower on 1st level with walk in shower, 3rd floor uses restroom with higher toilet       Prior Functioning/Environment Level of Independence: Independent with assistive device(s)        Comments: 90% of time used RW or cane for mobility, limited IADLs         OT Problem List: Decreased strength;Decreased range of motion;Impaired balance (sitting and/or standing);Decreased  safety awareness;Decreased knowledge of precautions;Pain      OT Treatment/Interventions: Self-care/ADL training;Therapeutic exercise;Therapeutic activities;Patient/family education;Balance training    OT Goals(Current goals can be found in the care plan section) Acute Rehab OT Goals Patient Stated Goal: to have less pain OT Goal Formulation: With patient Time For Goal Achievement: 02/05/18 Potential to Achieve Goals: Good  OT Frequency: Min 2X/week   Barriers to D/C:            Co-evaluation              AM-PAC PT "6 Clicks" Daily Activity     Outcome Measure Help from another person eating meals?: None Help from another person taking care of personal grooming?: A Little Help from another person toileting, which includes using toliet, bedpan, or urinal?: Total Help from another person bathing (including washing, rinsing, drying)?: A Lot Help from another person to put on and taking off regular upper body clothing?: A Little Help from another person to put on and taking off regular lower body clothing?: Total 6 Click Score: 14   End of Session Nurse Communication: Mobility status;Precautions(assist level)  Activity Tolerance: Patient limited by pain;Other (comment)(limited by hypotension) Patient left: in bed;with call bell/phone within reach;with bed alarm set;with family/visitor present(MD in room)  OT Visit Diagnosis: Other abnormalities of gait and mobility (R26.89);Muscle weakness (generalized) (M62.81);History of falling (Z91.81);Pain Pain - Right/Left: Left  Pain - part of body: Hip;Leg                Time: 0301-3143 OT Time Calculation (min): 33 min Charges:  OT General Charges $OT Visit: 1 Visit OT Evaluation $OT Eval Moderate Complexity: 1 Mod OT Treatments $Self Care/Home Management : 8-22 mins  Delight Stare, OTR/L  Pager [place] 01/22/2018, 12:15 PM

## 2018-01-22 NOTE — Progress Notes (Addendum)
PT unable to work with pt due to bed rest  Spoke to MD  MD stated pt is okay to ambulate, weight baring as tolerated  Placed care order PT aware

## 2018-01-22 NOTE — Consult Note (Signed)
Reason for Consult: low energy fall with pelvis fracture Referring Physician: Loleta Books MD  Wayne [name] is an 84 y.o. male.  HPI: 84 yo male who lives at home who reports a ground level fall at home onto carpeted floor. He was trying to stand and get his balance before walking but never got stable on the back of his chair before he fell. He reported immediate left hip pain and was transported to the hospital for admission and workup and treatment.  He reports no other complaints other than some left shoulder pain.   Past Medical History:  Diagnosis Date  . Aortic stenosis   . Arthritis   . Atrial fibrillation (HCC)    Chronic  . Atrial fibrillation, chronic ([place])   . BPH (benign prostatic hyperplasia)   . CAD (coronary artery disease)   . Chronic anticoagulation   . History of chicken pox   . HTN (hypertension)   . Mitral stenosis   . Old MI (myocardial infarction) 2007   s/p PCI to distal OM (no stent)  . Severe mitral regurgitation   . 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 CORONARY ANGIOPLASTY  2007   Distal OM  . PROSTATE BIOPSY    . RIGHT/LEFT HEART CATH AND CORONARY ANGIOGRAPHY N/A 08/01/2017   Procedure: RIGHT/LEFT HEART CATH AND CORONARY ANGIOGRAPHY;  Surgeon: Sherren Mocha, MD;  Location: [place] CV LAB;  Service: Cardiovascular;  Laterality: N/A;  . TEE WITHOUT CARDIOVERSION N/A 07/20/2017   Procedure: TRANSESOPHAGEAL ECHOCARDIOGRAM (TEE);  Surgeon: Lelon Perla, MD;  Location: [facility] ENDOSCOPY;  Service: Cardiovascular;  Laterality: N/A;  . TEE WITHOUT CARDIOVERSION N/A 09/04/2017   Procedure: TRANSESOPHAGEAL ECHOCARDIOGRAM (TEE);  Surgeon: Sherren Mocha, MD;  Location: [place];  Service: Open Heart Surgery;  Laterality: N/A;  . TRANSCATHETER AORTIC VALVE REPLACEMENT, TRANSFEMORAL N/A 09/04/2017   Procedure: TRANSCATHETER AORTIC VALVE REPLACEMENT, TRANSFEMORAL;  Surgeon: Sherren Mocha, MD;  Location: [place];  Service: Open Heart Surgery;  Laterality: N/A;    Family History  Problem Relation Age of Onset  . Heart disease Mother   . Heart attack Mother   . Heart disease Father   . Heart attack Father   . Heart attack Brother   . Heart attack Sister   . Stroke Brother     Social History:  reports that he has never smoked. He has never used smokeless tobacco. He reports that he does not drink alcohol or use drugs.  Allergies:  Allergies  Allergen Reactions  . Penicillins Rash and Other (See Comments)    PATIENT HAS HAD A PCN REACTION WITH IMMEDIATE RASH, FACIAL/TONGUE/THROAT SWELLING, SOB, OR LIGHTHEADEDNESS WITH HYPOTENSION:  #  #  #  YES  #  #  #   Has patient had a PCN reaction causing severe rash involving mucus membranes or skin necrosis: No Has patient had a PCN reaction that required hospitalization No Has patient had a PCN reaction occurring within the last 10 years: No If all of the above answers are "NO", then may proceed with Cephalosporin use.  . Prednisone Other (See Comments)    Wheezing  . Procaine Hcl Other (See Comments)    Passed out after being given this at dental appointment  . Levofloxacin Rash  . Oxycodone Other (See Comments)    constipation    Medications: I have reviewed the patient's current medications.  Results for orders placed  or performed during the hospital encounter of 01/21/18 (from the past 48 hour(s))  CBC with Differential     Status: Abnormal   Collection Time: 01/21/18  7:49 PM  Result Value Ref Range   WBC 8.4 4.0 - 10.5 K/uL   RBC 4.10 (L) 4.22 - 5.81 MIL/uL   Hemoglobin 12.2 (L) 13.0 - 17.0 g/dL   HCT 38.6 (L) 39.0 - 52.0 %   MCV 94.1 78.0 - 100.0 fL   MCH 29.8 26.0 - 34.0 pg   MCHC 31.6 30.0 - 36.0 g/dL   RDW 15.4 11.5 - 15.5 %   Platelets 151 150 - 400 K/uL   Neutrophils Relative % 77 %   Neutro Abs 6.5 1.7 - 7.7 K/uL   Lymphocytes Relative 11 %   Lymphs Abs 0.9 0.7 - 4.0 K/uL   Monocytes Relative  8 %   Monocytes Absolute 0.6 0.1 - 1.0 K/uL   Eosinophils Relative 2 %   Eosinophils Absolute 0.2 0.0 - 0.7 K/uL   Basophils Relative 1 %   Basophils Absolute 0.1 0.0 - 0.1 K/uL   Immature Granulocytes 1 %   Abs Immature Granulocytes 0.1 0.0 - 0.1 K/uL    Comment: Performed at [facility], [address], [place], [place] [address]  Comprehensive metabolic panel     Status: Abnormal   Collection Time: 01/21/18  7:49 PM  Result Value Ref Range   Sodium 141 135 - 145 mmol/L   Potassium 4.1 3.5 - 5.1 mmol/L   Chloride 102 98 - 111 mmol/L   CO2 32 22 - 32 mmol/L   Glucose, Bld 107 (H) 70 - 99 mg/dL   BUN 17 8 - 23 mg/dL   Creatinine, Ser 1.23 0.61 - 1.24 mg/dL   Calcium 9.0 8.9 - 10.3 mg/dL   Total Protein 6.6 6.5 - 8.1 g/dL   Albumin 3.7 3.5 - 5.0 g/dL   AST 24 15 - 41 U/L   ALT 14 0 - 44 U/L   Alkaline Phosphatase 39 38 - 126 U/L   Total Bilirubin 1.0 0.3 - 1.2 mg/dL   GFR calc non Af Amer 51 (L) >60 mL/min   GFR calc Af Amer 59 (L) >60 mL/min    Comment: (NOTE) The eGFR has been calculated using the CKD EPI equation. This calculation has not been validated in all clinical situations. eGFR's persistently <60 mL/min signify possible Chronic Kidney Disease.    Anion gap 7 5 - 15    Comment: Performed at [facility] [address], [place], [place] [address]  Protime-INR     Status: Abnormal   Collection Time: 01/21/18  7:49 PM  Result Value Ref Range   Prothrombin Time 18.2 (H) 11.4 - 15.2 seconds   INR 1.52     Comment: Performed at [facility] [address], [place], [place] [address]  Type and screen     Status: None   Collection Time: 01/21/18  9:17 PM  Result Value Ref Range   ABO/RH(D) A POS    Antibody Screen NEG    Sample Expiration      01/24/2018 Performed at [facility], [place] [address], [place], [place] [address]   Brain natriuretic peptide     Status: Abnormal   Collection Time: 01/22/18  2:13 AM  Result Value Ref Range   B  Natriuretic Peptide 271.8 (H) 0.0 - 100.0 pg/mL    Comment: Performed at [facility],  [address] [place], [place] [address]  Digoxin level     Status: Abnormal   Collection Time: 01/22/18  2:13 AM  Result Value Ref Range   Digoxin Level <0.2 (L) 0.8 - 2.0 ng/mL    Comment: RESULTS CONFIRMED BY MANUAL DILUTION Performed at [facility], [place] [address] [place], [place] [address]   CBC     Status: Abnormal   Collection Time: 01/22/18  2:13 AM  Result Value Ref Range   WBC 8.2 4.0 - 10.5 K/uL   RBC 3.85 (L) 4.22 - 5.81 MIL/uL   Hemoglobin 11.5 (L) 13.0 - 17.0 g/dL   HCT 35.6 (L) 39.0 - 52.0 %   MCV 92.5 78.0 - 100.0 fL   MCH 29.9 26.0 - 34.0 pg   MCHC 32.3 30.0 - 36.0 g/dL   RDW 15.2 11.5 - 15.5 %   Platelets 127 (L) 150 - 400 K/uL    Comment: Performed at [facility], [place] [address] [place], [place] [address]  CK     Status: None   Collection Time: 01/22/18  2:13 AM  Result Value Ref Range   Total CK 163 49 - 397 U/L    Comment: Performed at [facility], [place] [address], [place], [place] [address]  Basic metabolic panel     Status: Abnormal   Collection Time: 01/22/18  6:35 AM  Result Value Ref Range   Sodium 140 135 - 145 mmol/L   Potassium 4.1 3.5 - 5.1 mmol/L   Chloride 104 98 - 111 mmol/L   CO2 26 22 - 32 mmol/L   Glucose, Bld 125 (H) 70 - 99 mg/dL   BUN 15 8 - 23 mg/dL   Creatinine, Ser 0.94 0.61 - 1.24 mg/dL   Calcium 8.5 (L) 8.9 - 10.3 mg/dL   GFR calc non Af Amer >60 >60 mL/min   GFR calc Af Amer >60 >60 mL/min    Comment: (NOTE) The eGFR has been calculated using the CKD EPI equation. This calculation has not been validated in all clinical situations. eGFR's persistently <60 mL/min signify possible Chronic Kidney Disease.    Anion gap 10 5 - 15    Comment: Performed at [facility] [address], [place], [place] [address]    Ct Abdomen Pelvis Wo Contrast  Result Date: 01/21/2018 CLINICAL DATA:  Status post fall  backwards, with left pelvic tenderness. Initial encounter. EXAM: CT ABDOMEN AND PELVIS WITHOUT CONTRAST TECHNIQUE: Multidetector CT imaging of the abdomen and pelvis was performed following the standard protocol without IV contrast. COMPARISON:  CTA of the abdomen and pelvis performed 08/06/2017 FINDINGS: Lower chest: Small bilateral pleural effusions are noted, with bibasilar atelectasis. A valve replacement is noted at the aortic valve. Diffuse coronary artery calcifications are seen. Hepatobiliary: The liver is unremarkable in appearance. The gallbladder is unremarkable in appearance. The common bile duct remains normal in caliber. Pancreas: The pancreas is within normal limits. Spleen: The spleen is unremarkable in appearance. Adrenals/Urinary Tract: The adrenal glands are unremarkable in appearance. A small left renal cyst is noted. Nonspecific perinephric stranding is noted bilaterally. There is no evidence of hydronephrosis. No renal or ureteral stones are identified. Stomach/Bowel: The stomach is unremarkable in appearance. The small bowel is within normal limits. The appendix is normal in caliber, without evidence of appendicitis. The colon is unremarkable in appearance. Vascular/Lymphatic: Diffuse calcification is seen along the abdominal aorta and its branches. The abdominal aorta is otherwise grossly unremarkable.  The inferior vena cava is grossly unremarkable. No retroperitoneal lymphadenopathy is seen. No pelvic sidewall lymphadenopathy is identified. Reproductive: The bladder is mildly distended, with a left-sided diverticulum. The prostate is normal in size. Other: Soft tissue hemorrhage is noted along the space of Retzius, anterior to the bladder, and tracking along the left pelvic sidewall, reflecting the overlying left pubic rami fractures. There is also diffuse soft tissue hemorrhage along the left pectineus and adductor musculature. Musculoskeletal: There are mildly displaced and mildly  comminuted fractures of the left superior and inferior pubic rami. No additional fractures are seen. There is mild chronic loss of height at vertebral body L3. IMPRESSION: 1. Mildly displaced and mildly comminuted fractures of the left superior and inferior pubic rami. 2. Soft tissue hemorrhage along the space of Retzius, anterior to the bladder, and tracking along the left pelvic sidewall, reflecting the overlying left pubic rami fractures. 3. Diffuse soft tissue hemorrhage along the left pectineus and adductor musculature. 4. Small bilateral pleural effusions, with bibasilar atelectasis. 5. Diffuse coronary artery calcifications seen. 6. Small left renal cyst noted. Aortic Atherosclerosis (ICD10-I70.0). These results were called by telephone at the time of interpretation on 01/21/2018 at 10:58 pm to Dr. Corrie Dandy , who verbally acknowledged these results. Electronically Signed   By: Garald Balding M.D.   On: 01/21/2018 23:00   Ct Head Wo Contrast  Result Date: 01/21/2018 CLINICAL DATA:  Fall EXAM: CT HEAD WITHOUT CONTRAST CT CERVICAL SPINE WITHOUT CONTRAST TECHNIQUE: Multidetector CT imaging of the head and cervical spine was performed following the standard protocol without intravenous contrast. Multiplanar CT image reconstructions of the cervical spine were also generated. COMPARISON:  05/21/2017 FINDINGS: CT HEAD FINDINGS Brain: Global atrophy. There are mild chronic ischemic changes in the periventricular white matter. There is no mass effect, midline shift, or acute hemorrhage. Vascular: No hyperdense vessel or unexpected calcification. Skull: Cranium is intact. Sinuses/Orbits: Mastoid air cells are clear. Visualized paranasal sinuses are clear. Orbits are within normal limits. Other: Noncontributory CT CERVICAL SPINE FINDINGS Alignment: There is anatomic alignment. Skull base and vertebrae: There is no vertebral compression deformity. No obvious fracture. No dislocation. Soft tissues and spinal canal:  No obvious spinal canal hematoma. There is no obvious soft tissue hematoma in the neck. Thyroid is unremarkable. Atherosclerotic calcifications of the carotids bilaterally. Disc levels: There are bridging anterior osteophytes throughout the cervical spine. There is also ankylosis of the posterior elements in the upper cervical spine. Left-sided uncovertebral osteophytes cause some left foraminal narrowing at C3-4. There are prominent posterior disc osteophytes at C4-5. This results in an element of central spinal stenosis. Left paracentral and foraminal osteophytes at C6-7 result in severe left foraminal narrowing. Upper chest: There is hazy ground-glass in the visualized left upper lobe. Other: Noncontributory IMPRESSION: No acute intracranial pathology. Chronic ischemic changes are noted. No evidence of cervical spine injury. Degenerative changes are noted above. Hazy ground-glass opacity in the left upper lobe. Electronically Signed   By: Marybelle Killings M.D.   On: 01/21/2018 20:02   Ct Cervical Spine Wo Contrast  Result Date: 01/21/2018 CLINICAL DATA:  Fall EXAM: CT HEAD WITHOUT CONTRAST CT CERVICAL SPINE WITHOUT CONTRAST TECHNIQUE: Multidetector CT imaging of the head and cervical spine was performed following the standard protocol without intravenous contrast. Multiplanar CT image reconstructions of the cervical spine were also generated. COMPARISON:  05/21/2017 FINDINGS: CT HEAD FINDINGS Brain: Global atrophy. There are mild chronic ischemic changes in the periventricular white matter. There is no mass effect, midline  shift, or acute hemorrhage. Vascular: No hyperdense vessel or unexpected calcification. Skull: Cranium is intact. Sinuses/Orbits: Mastoid air cells are clear. Visualized paranasal sinuses are clear. Orbits are within normal limits. Other: Noncontributory CT CERVICAL SPINE FINDINGS Alignment: There is anatomic alignment. Skull base and vertebrae: There is no vertebral compression deformity. No  obvious fracture. No dislocation. Soft tissues and spinal canal: No obvious spinal canal hematoma. There is no obvious soft tissue hematoma in the neck. Thyroid is unremarkable. Atherosclerotic calcifications of the carotids bilaterally. Disc levels: There are bridging anterior osteophytes throughout the cervical spine. There is also ankylosis of the posterior elements in the upper cervical spine. Left-sided uncovertebral osteophytes cause some left foraminal narrowing at C3-4. There are prominent posterior disc osteophytes at C4-5. This results in an element of central spinal stenosis. Left paracentral and foraminal osteophytes at C6-7 result in severe left foraminal narrowing. Upper chest: There is hazy ground-glass in the visualized left upper lobe. Other: Noncontributory IMPRESSION: No acute intracranial pathology. Chronic ischemic changes are noted. No evidence of cervical spine injury. Degenerative changes are noted above. Hazy ground-glass opacity in the left upper lobe. Electronically Signed   By: Marybelle Killings M.D.   On: 01/21/2018 20:02   Dg Pelvis Portable  Result Date: 01/21/2018 CLINICAL DATA:  84 year old male with history of trauma from a fall. Pain in the left hip. EXAM: PORTABLE PELVIS 1-2 VIEWS COMPARISON:  Left hip radiograph 09/24/2017. FINDINGS: Two portable views of the pelvis demonstrate what appears to be subtle discontinuity of the left superior and inferior pubic rami, concerning for minimally displaced fractures. Bilateral proximal femurs as visualized appear intact and the femoral heads appear located on these frontal projections. IMPRESSION: 1. Probable fractures of the left superior and inferior pubic rami, poorly evaluated on today's portable frontal views. These could be confirmed with noncontrast CT the pelvis if clinically appropriate. Electronically Signed   By: Vinnie Langton M.D.   On: 01/21/2018 19:40   Dg Chest Portable 1 View  Result Date: 01/21/2018 CLINICAL DATA:   Fall EXAM: PORTABLE CHEST 1 VIEW COMPARISON:  09/10/2017 FINDINGS: The heart remains moderately enlarged. Aortic valve replacement hardware is stable in position. Normal vascularity. No pneumothorax or pleural effusion. There is no evidence of consolidation. IMPRESSION: Cardiomegaly without decompensation. Electronically Signed   By: Marybelle Killings M.D.   On: 01/21/2018 19:39    ROS Blood pressure 129/66, pulse 88, temperature 98.5 F (36.9 C), temperature source Oral, resp. rate 20, height 5' 10.5" (1.791 m), weight 79.9 kg (176 lb 2.4 oz), SpO2 91 %. Physical Exam neck with normal AROM and no pain. Left shoulder tender along the peri scapular area and also the posterior deltoid area. Shoulder is grossly located and he is able to reach over head without assistance with mild pain.  Good static resisted ER strength with close elbow. Good pus and pull against resistance with the left arm and hand. Right UE with pain free full AROM and good push pull. T and L spine non tender and no deformity.  Tender over the left pelvis and groin and hip area, but no pain with passive log roll of the left leg. He does have pain trying to flex his hip and bend his knee. Right LE with pain free AROM of the hip knee and ankle, NVI distally Left knee non swollen and non tender, no calf swelling and no tenderness around the foot or ankle. 5/5 motor strength distally  Assessment/Plan: L should pain - no obvious fracture noted  on the chest XR and no suspicion for significant RCT. Plan therapy for ADLs and mobilization - will watch this and image formally if he continues to hurt.  Left minimally displaced superior and inferior pubic ramus fractures with no evidence of sacral injury. No evidence of hip fracture.  Due to stable nature of these fractures will allow activity as tolerated and WBAT on the left leg.   DVT prophylaxis.  Therapy PT, OT  D/C planning  Will need to see the patient back in the office in one month  for follow up and XRAYs.  Wayne [name],Wayne [name] 01/22/2018, 12:54 PM

## 2018-01-22 NOTE — Progress Notes (Signed)
Pt sat up to edge of bed with OT  Pt pressure dropped to 78/48 heart rate in the 80s per OT  When pt positioned back into bed pressure rechecked 95/67 heart rate 92  MD aware  MD at bedside

## 2018-01-22 NOTE — Progress Notes (Signed)
PT Cancellation Note  Patient Details Name: Octavius Shin MRN: [id number] DOB: 1929/07/16   Cancelled Treatment:    Reason Eval/Treat Not Completed: Medical issues which prohibited therapy(pt with bedrest order with pubic rami fx. Await clearance for mobility)   Aadon Gorelik B Aleasha Fregeau 01/22/2018, 7:22 AM  Elwyn Reach, [place]

## 2018-01-22 NOTE — Evaluation (Signed)
Physical Therapy Evaluation Patient Details Name: Wayne [name] MRN: [id number] DOB: [birth year] Today's Date: 01/22/2018   History of Present Illness  Pt is a 84 y/o male admitted after falling at home with mildly comminuted fractures of L superior and inferior pubic rami and soft tissue hemorrhage, small bilateral pleural effusions. PMH significant for but not limited to: HTN, stroke, TAVR, MV stenosis, A fib on coumadin, BPH, CAD, CHF.    Clinical Impression  Arrived to pt during lunch with friends and wife present. Pt able to sit EOB with min a x2 followed by standing twice for 30s then 43min respectively. See orthostatic vitals below.  Orthostatic VS for the past 24 hrs (Last 3 readings):  BP- Lying Pulse- Lying BP- Sitting Pulse- Sitting BP- Standing at 0 minutes Pulse- Standing at 0 minutes  01/22/18 1336 128/64 80 113/60 100 (!) 77/49 96  Pt reported dizziness that improved with sitting, but no improvement during standing. Pt requested to be returned to bed. Pt deficits in strength, balance, functional mobility. Pt would benefit from skilled physical therapy to improve listed deficits for increased independence, safety and decreased fall risk.       Follow Up Recommendations SNF;Supervision/Assistance - 24 hour    Equipment Recommendations  None recommended by PT    Recommendations for Other Services       Precautions / Restrictions Precautions Precautions: Fall Restrictions Weight Bearing Restrictions: Yes LLE Weight Bearing: Weight bearing as tolerated      Mobility  Bed Mobility Overal bed mobility: Needs Assistance Bed Mobility: Supine to Sit;Sit to Supine     Supine to sit: +2 for physical assistance;Max assist Sit to supine: Max assist;+2 for physical assistance   General bed mobility comments: pt require complete assist to move lower body to EOB but able to assist with right arm to pull into trunk flexion with supine to sit, phsyical assist to bring  bilateral LE to bed with cues to control trunk.   Transfers Overall transfer level: Needs assistance Equipment used: Rolling walker (2 wheeled) Transfers: Sit to/from Stand Sit to Stand: +2 physical assistance;Min assist;+2 safety/equipment         General transfer comment: attempted to stand from lower bed pt unable, increased elevation 8" and pt was able. pt able to stand 2x, reported significant dizziness that decreased with time. Pt reported cramping in hip with need to sit and rest after 45 seconds, heavy cues to return to sitting to initiate hip flexion  Ambulation/Gait             General Gait Details: pt defered attempt for ambulation, requested return to bed, attempted to have pt offweight with side step to R, pt unable even with increased cues and motivation   Stairs            Wheelchair Mobility    Modified Rankin (Stroke Patients Only)       Balance Overall balance assessment: Needs assistance Sitting-balance support: Bilateral upper extremity supported;Feet supported Sitting balance-Leahy Scale: Poor Sitting balance - Comments: min guard for safety, reported dizziness that improved with time. see orthostatic vitals in comments  Postural control: Other (comment)(forward lean )   Standing balance-Leahy Scale: Poor Standing balance comment: min assist for balance with increased dizziness on standing, did not disipate.  Verbal cues to correct posture                              Pertinent Vitals/Pain  Pain Location: L LE, pelvis Pain Descriptors / Indicators: Discomfort;Grimacing;Guarding Pain Intervention(s): Limited activity within patient's tolerance;Monitored during session;Repositioned    Home Living Family/patient expects to be discharged to:: Private residence Living Arrangements: Spouse/significant other Available Help at Discharge: Family;Available 24 hours/day Type of Home: House Home Access: Stairs to enter Entrance  Stairs-Rails: Left Entrance Stairs-Number of Steps: 2 Home Layout: Multi-level;Bed/bath upstairs Home Equipment: Walker - 2 wheels;Walker - 4 wheels;Cane - single point;Tub bench;Bedside commode;Grab bars - toilet;Grab bars - tub/shower Additional Comments: completes shower on 1st level with walk in shower, 3rd floor uses restroom with higher toilet     Prior Function Level of Independence: Independent with assistive device(s)         Comments: 90% of time used RW or cane for mobility, limited IADLs      Hand Dominance   Dominant Hand: Right    Extremity/Trunk Assessment   Upper Extremity Assessment Upper Extremity Assessment: Generalized weakness;LUE deficits/detail LUE Deficits / Details: unwilling to move LUE, claimed redirected pain to the hip LUE: Unable to fully assess due to pain    Lower Extremity Assessment Lower Extremity Assessment: Generalized weakness;LLE deficits/detail LLE Deficits / Details: increased pain on LLE with movement  LLE: Unable to fully assess due to pain    Cervical / Trunk Assessment Cervical / Trunk Assessment: Kyphotic  Communication   Communication: HOH  Cognition Arousal/Alertness: Awake/alert Behavior During Therapy: WFL for tasks assessed/performed Overall Cognitive Status: Within Functional Limits for tasks assessed                                 General Comments: pt claimed has short term memory loss, but none was noticed during session      General Comments General comments (skin integrity, edema, etc.): limited due to hypotensive ocne standing. pt requested to be returned to bed     Exercises General Exercises - Lower Extremity Long Arc Quad: AAROM;5 reps;Both;Seated   Assessment/Plan    PT Assessment Patient needs continued PT services  PT Problem List Decreased strength;Decreased mobility;Decreased activity tolerance;Decreased balance;Decreased range of motion       PT Treatment Interventions DME  instruction;Therapeutic activities;Gait training;Therapeutic exercise;Patient/family education;Stair training;Balance training;Functional mobility training;Neuromuscular re-education    PT Goals (Current goals can be found in the Care Plan section)       Frequency Min 3X/week   Barriers to discharge Inaccessible home environment;Decreased caregiver support multi-level home with bed up 8-9 stairs, wife not able to phsyically assist     Co-evaluation               AM-PAC PT "6 Clicks" Daily Activity  Outcome Measure Difficulty turning over in bed (including adjusting bedclothes, sheets and blankets)?: Unable Difficulty moving from lying on back to sitting on the side of the bed? : Unable Difficulty sitting down on and standing up from a chair with arms (e.g., wheelchair, bedside commode, etc,.)?: Unable Help needed moving to and from a bed to chair (including a wheelchair)?: A Lot Help needed walking in hospital room?: Total Help needed climbing 3-5 steps with a railing? : Total 6 Click Score: 7    End of Session Equipment Utilized During Treatment: Gait belt Activity Tolerance: Patient limited by pain;Patient tolerated treatment well Patient left: in bed;with bed alarm set;with call bell/phone within reach;with family/visitor present Nurse Communication: Mobility status PT Visit Diagnosis: Unsteadiness on feet (R26.81);Other abnormalities of gait and mobility (R26.89);Muscle weakness (generalized) (  M62.81);Dizziness and giddiness (R42)    Time: 7262-0355 PT Time Calculation (min) (ACUTE ONLY): 32 min   Charges:   PT Evaluation $PT Eval Moderate Complexity: 1 Mod PT Treatments $Therapeutic Activity: 8-22 mins        Samuella Bruin, [place]  [facility] [phone]   Samuella Bruin 01/22/2018, 2:08 PM

## 2018-01-22 NOTE — Progress Notes (Signed)
Nutrition Brief Note  Patient identified on the Malnutrition Screening Tool (MST) Report  Wt Readings from Last 15 Encounters:  01/22/18 176 lb 2.4 oz (79.9 kg)  12/31/17 176 lb (79.8 kg)  12/19/17 172 lb (78 kg)  12/04/17 173 lb (78.5 kg)  10/24/17 168 lb (76.2 kg)  09/24/17 159 lb (72.1 kg)  09/18/17 167 lb (75.8 kg)  09/11/17 161 lb 6 oz (73.2 kg)  09/06/17 168 lb 6.9 oz (76.4 kg)  08/31/17 170 lb 12.8 oz (77.5 kg)  08/29/17 172 lb (78 kg)  08/17/17 171 lb 3.2 oz (77.7 kg)  08/01/17 170 lb (77.1 kg)  07/26/17 165 lb (74.8 kg)  07/24/17 174 lb 12.8 oz (79.3 kg)   Gerard Ishmel Acevedo is a 84 y.o. male with medical history significant of hypertension, stroke, aortic stenosis (s/p of TAVR with biovalve), MV stenosis, A fib on Coumadin, BPH, CAD, dCHF, who presents with fall, left hip and left pelvis pain.  Pt admitted s/p fall, lt hip and pelvic fracture.   Spoke with pt, wife, and son at bedside. All confirm that pt has a great appetite, is very active, eats eats extremely healthy. Pt typically consumes 3 meals per day (Breakfast: oatmeal, Lunch: vegetable plate, Dinner: sandwich OR meat, starch, and vegetable).   Per pt wife, pt lost approximately 20# over the past few months due to hospitalization. However, pt has since regained lost weight. Pt was consuming Glucerna and Boost PTA. RD offered to order supplements during stay, however, pt politely declined offer, stating he doesn't need them.   Nutrition-Focused physical exam completed. Findings are no fat depletion, mild muscle depletion, and no edema. Mild muscle depletions noted were common for patients of advanced age.  RD will add MVI.    Body mass index is 24.92 kg/m. Patient meets criteria for norm based on current BMI.   Current diet order is Heart Healthy, patient is consuming approximately 100% of meals at this time. Labs and medications reviewed.   No nutrition interventions warranted at this time. If nutrition  issues arise, please consult RD.   Stevens Magwood A. Jimmye Norman, RD, LDN, CDE Pager: [phone] After hours Pager: [phone]

## 2018-01-23 DIAGNOSIS — D62 Acute posthemorrhagic anemia: Secondary | ICD-10-CM

## 2018-01-23 DIAGNOSIS — D696 Thrombocytopenia, unspecified: Secondary | ICD-10-CM

## 2018-01-23 DIAGNOSIS — S32502D Unspecified fracture of left pubis, subsequent encounter for fracture with routine healing: Secondary | ICD-10-CM

## 2018-01-23 LAB — BASIC METABOLIC PANEL
ANION GAP: 9 (ref 5–15)
BUN: 15 mg/dL (ref 8–23)
CALCIUM: 8.4 mg/dL — AB (ref 8.9–10.3)
CO2: 26 mmol/L (ref 22–32)
Chloride: 102 mmol/L (ref 98–111)
Creatinine, Ser: 0.92 mg/dL (ref 0.61–1.24)
Glucose, Bld: 110 mg/dL — ABNORMAL HIGH (ref 70–99)
Potassium: 4.1 mmol/L (ref 3.5–5.1)
SODIUM: 137 mmol/L (ref 135–145)

## 2018-01-23 LAB — BLOOD GAS, ARTERIAL
Acid-Base Excess: 2.4 mmol/L — ABNORMAL HIGH (ref 0.0–2.0)
BICARBONATE: 26.2 mmol/L (ref 20.0–28.0)
Drawn by: 280981
O2 Content: 2 L/min
O2 Saturation: 96.1 %
PCO2 ART: 39.1 mmHg (ref 32.0–48.0)
PH ART: 7.441 (ref 7.350–7.450)
Patient temperature: 98.6
pO2, Arterial: 80.2 mmHg — ABNORMAL LOW (ref 83.0–108.0)

## 2018-01-23 LAB — CBC
HCT: 33.9 % — ABNORMAL LOW (ref 39.0–52.0)
Hemoglobin: 11 g/dL — ABNORMAL LOW (ref 13.0–17.0)
MCH: 30.3 pg (ref 26.0–34.0)
MCHC: 32.4 g/dL (ref 30.0–36.0)
MCV: 93.4 fL (ref 78.0–100.0)
PLATELETS: 126 10*3/uL — AB (ref 150–400)
RBC: 3.63 MIL/uL — ABNORMAL LOW (ref 4.22–5.81)
RDW: 15.3 % (ref 11.5–15.5)
WBC: 9 10*3/uL (ref 4.0–10.5)

## 2018-01-23 LAB — PROTIME-INR
INR: 1.3
Prothrombin Time: 16.1 seconds — ABNORMAL HIGH (ref 11.4–15.2)

## 2018-01-23 LAB — GLUCOSE, CAPILLARY: Glucose-Capillary: 91 mg/dL (ref 70–99)

## 2018-01-23 MED ORDER — ACETAMINOPHEN 325 MG PO TABS
650.0000 mg | ORAL_TABLET | Freq: Four times a day (QID) | ORAL | Status: DC | PRN
  Administered 2018-01-23 – 2018-01-24 (×2): 650 mg via ORAL
  Filled 2018-01-23 (×2): qty 2

## 2018-01-23 MED ORDER — HYDROCODONE-ACETAMINOPHEN 5-325 MG PO TABS
0.5000 | ORAL_TABLET | Freq: Two times a day (BID) | ORAL | Status: DC | PRN
  Filled 2018-01-23: qty 1

## 2018-01-23 MED ORDER — WARFARIN SODIUM 5 MG PO TABS
5.0000 mg | ORAL_TABLET | Freq: Once | ORAL | Status: AC
  Administered 2018-01-23: 5 mg via ORAL
  Filled 2018-01-23: qty 1

## 2018-01-23 MED ORDER — WARFARIN - PHARMACIST DOSING INPATIENT
Freq: Every day | Status: DC
  Administered 2018-01-23: 19:00:00

## 2018-01-23 NOTE — Clinical Social Work Placement (Signed)
   CLINICAL SOCIAL WORK PLACEMENT  NOTE  Date:  01/23/2018  Patient Details  Name: Mohamed Portlock MRN: [id number] Date of Birth: Dec 12, 1929  Clinical Social Work is seeking post-discharge placement for this patient at the [place] level of care (*CSW will initial, date and re-position this form in  chart as items are completed):  Yes   Patient/family provided with [facility]'s list of facilities offering this level of care within the geographic area requested by the patient (or if unable, by the patient's family).  Yes   Patient/family informed of their freedom to choose among providers that offer the needed level of care, that participate in Medicare, Medicaid or managed care program needed by the patient, have an available bed and are willing to accept the patient.  Yes   Patient/family informed of 's ownership interest in [facility] and [facility], as well as of the fact that they are under no obligation to receive care at these facilities.  PASRR submitted to EDS on 01/23/18     PASRR number received on 01/23/18     Existing PASRR number confirmed on       FL2 transmitted to all facilities in geographic area requested by pt/family on 01/23/18     FL2 transmitted to all facilities within larger geographic area on       Patient informed that his/her managed care company has contracts with or will negotiate with certain facilities, including the following:            Patient/family informed of bed offers received.  Patient chooses bed at       Physician recommends and patient chooses bed at      Patient to be transferred to   on  .  Patient to be transferred to facility by       Patient family notified on   of transfer.  Name of family member notified:        PHYSICIAN Please sign FL2     Additional Comment:    _______________________________________________ Candie Chroman, LCSW 01/23/2018,  11:50 AM

## 2018-01-23 NOTE — Progress Notes (Signed)
Called RT regarding stat blood gas order, RT aware Ordered continuous pulse oxygen for pt  Pt daughter at bedside upset. Stated she ordered lunch for pt at 1300. Called dietary at 1323, dietary stated pt tray is being made  No tray at 1445, called dietary, dietary stated they are still creating the tray  Asked for dietary manager to come to bedside to speak to family   Pt daughter also expresses concern for pt in pain when lifting head, update provided to pt daughter   Informed primary RN Gretta Cool

## 2018-01-23 NOTE — Progress Notes (Signed)
Fed pt breakfast, pt tolerated well

## 2018-01-23 NOTE — Progress Notes (Signed)
Page sent to Cardiology ________________________  Page to Dr Algis Liming   3e20 Change in Neuro STatus, slow responses, VS/CBG WNL.

## 2018-01-23 NOTE — Progress Notes (Addendum)
Progress Note  Patient Name: Wayne [name] Date of Encounter: 01/23/2018  Primary Cardiologist: Dr. Stanford Breed  Subjective   Pt feeling about the same as yesterday. Family concerned with restarting Coumadin. Hb 11.5>11.0 today. INR 1.30   Inpatient Medications    Scheduled Meds: . alfuzosin  10 mg Oral Q breakfast  . cholecalciferol  2,000 Units Oral Daily  . digoxin  125 mcg Oral Daily  . docusate sodium  100 mg Oral QPM  . finasteride  5 mg Oral QHS  . Melatonin  6 mg Oral QHS  . metoprolol tartrate  12.5 mg Oral BID  . multivitamin with minerals  1 tablet Oral Daily  . polyethylene glycol  17 g Oral Daily   Continuous Infusions:  PRN Meds: acetaminophen, dextromethorphan-guaiFENesin, hydrALAZINE, HYDROcodone-acetaminophen, methocarbamol, morphine injection, MUSCLE RUB, ondansetron (ZOFRAN) IV, sodium chloride, sodium phosphate, triamcinolone cream, zolpidem   Vital Signs    Vitals:   01/23/18 0815 01/23/18 1236 01/23/18 1247 01/23/18 1252  BP: 132/81 116/63 105/64   Pulse: 96 94 94   Resp:  18    Temp:  99.1 F (37.3 C)    TempSrc:  Oral    SpO2:  91% 91% 96%  Weight:      Height:        Intake/Output Summary (Last 24 hours) at 01/23/2018 1316 Last data filed at 01/23/2018 0900 Gross per 24 hour  Intake 200 ml  Output 150 ml  Net 50 ml   Filed Weights   01/21/18 1843 01/22/18 0204 01/23/18 0415  Weight: 176 lb (79.8 kg) 176 lb 2.4 oz (79.9 kg) 173 lb 6.4 oz (78.7 kg)    Physical Exam   General: Frail, Well developed, well nourished, NAD Skin: Warm, dry, intact  Head: Normocephalic, atraumatic, sclera non-icteric, no xanthomas, clear, moist mucus membranes. Neck: Negative for carotid bruits. No JVD Lungs: Mild crackles noted. Breathing is unlabored. Cardiovascular: RRR with S1 S2. + murmur. No rubs, gallops, or LV heave appreciated. Abdomen: Soft, non-tender, non-distended with normoactive bowel sounds. No hepatomegaly, No rebound/guarding.  No obvious abdominal masses. MSK: Strength and tone appear normal for age. 5/5 in all extremities Extremities: No edema. No clubbing or cyanosis. DP/PT pulses 2+ bilaterally Neuro: Alert and oriented. No focal deficits. No facial asymmetry. MAE spontaneously. Psych: Responds to questions appropriately with normal affect.    Labs    Chemistry Recent Labs  Lab 01/21/18 1949 01/22/18 0635 01/23/18 0457  NA 141 140 137  K 4.1 4.1 4.1  CL 102 104 102  CO2 32 26 26  GLUCOSE 107* 125* 110*  BUN 17 15 15   CREATININE 1.23 0.94 0.92  CALCIUM 9.0 8.5* 8.4*  PROT 6.6  --   --   ALBUMIN 3.7  --   --   AST 24  --   --   ALT 14  --   --   ALKPHOS 39  --   --   BILITOT 1.0  --   --   GFRNONAA 51* >60 >60  GFRAA 59* >60 >60  ANIONGAP 7 10 9      Hematology Recent Labs  Lab 01/21/18 1949 01/22/18 0213 01/23/18 0457  WBC 8.4 8.2 9.0  RBC 4.10* 3.85* 3.63*  HGB 12.2* 11.5* 11.0*  HCT 38.6* 35.6* 33.9*  MCV 94.1 92.5 93.4  MCH 29.8 29.9 30.3  MCHC 31.6 32.3 32.4  RDW 15.4 15.2 15.3  PLT 151 127* 126*    Cardiac EnzymesNo results for input(s): TROPONINI in  the last 168 hours. No results for input(s): TROPIPOC in the last 168 hours.   BNP Recent Labs  Lab 01/22/18 0213  BNP 271.8*     DDimer No results for input(s): DDIMER in the last 168 hours.   Radiology    Ct Abdomen Pelvis Wo Contrast  Result Date: 01/21/2018 CLINICAL DATA:  Status post fall backwards, with left pelvic tenderness. Initial encounter. EXAM: CT ABDOMEN AND PELVIS WITHOUT CONTRAST TECHNIQUE: Multidetector CT imaging of the abdomen and pelvis was performed following the standard protocol without IV contrast. COMPARISON:  CTA of the abdomen and pelvis performed 08/06/2017 FINDINGS: Lower chest: Small bilateral pleural effusions are noted, with bibasilar atelectasis. A valve replacement is noted at the aortic valve. Diffuse coronary artery calcifications are seen. Hepatobiliary: The liver is unremarkable in  appearance. The gallbladder is unremarkable in appearance. The common bile duct remains normal in caliber. Pancreas: The pancreas is within normal limits. Spleen: The spleen is unremarkable in appearance. Adrenals/Urinary Tract: The adrenal glands are unremarkable in appearance. A small left renal cyst is noted. Nonspecific perinephric stranding is noted bilaterally. There is no evidence of hydronephrosis. No renal or ureteral stones are identified. Stomach/Bowel: The stomach is unremarkable in appearance. The small bowel is within normal limits. The appendix is normal in caliber, without evidence of appendicitis. The colon is unremarkable in appearance. Vascular/Lymphatic: Diffuse calcification is seen along the abdominal aorta and its branches. The abdominal aorta is otherwise grossly unremarkable. The inferior vena cava is grossly unremarkable. No retroperitoneal lymphadenopathy is seen. No pelvic sidewall lymphadenopathy is identified. Reproductive: The bladder is mildly distended, with a left-sided diverticulum. The prostate is normal in size. Other: Soft tissue hemorrhage is noted along the space of Retzius, anterior to the bladder, and tracking along the left pelvic sidewall, reflecting the overlying left pubic rami fractures. There is also diffuse soft tissue hemorrhage along the left pectineus and adductor musculature. Musculoskeletal: There are mildly displaced and mildly comminuted fractures of the left superior and inferior pubic rami. No additional fractures are seen. There is mild chronic loss of height at vertebral body L3. IMPRESSION: 1. Mildly displaced and mildly comminuted fractures of the left superior and inferior pubic rami. 2. Soft tissue hemorrhage along the space of Retzius, anterior to the bladder, and tracking along the left pelvic sidewall, reflecting the overlying left pubic rami fractures. 3. Diffuse soft tissue hemorrhage along the left pectineus and adductor musculature. 4. Small  bilateral pleural effusions, with bibasilar atelectasis. 5. Diffuse coronary artery calcifications seen. 6. Small left renal cyst noted. Aortic Atherosclerosis (ICD10-I70.0). These results were called by telephone at the time of interpretation on 01/21/2018 at 10:58 pm to Dr. Corrie Dandy , who verbally acknowledged these results. Electronically Signed   By: Garald Balding M.D.   On: 01/21/2018 23:00   Ct Head Wo Contrast  Result Date: 01/21/2018 CLINICAL DATA:  Fall EXAM: CT HEAD WITHOUT CONTRAST CT CERVICAL SPINE WITHOUT CONTRAST TECHNIQUE: Multidetector CT imaging of the head and cervical spine was performed following the standard protocol without intravenous contrast. Multiplanar CT image reconstructions of the cervical spine were also generated. COMPARISON:  05/21/2017 FINDINGS: CT HEAD FINDINGS Brain: Global atrophy. There are mild chronic ischemic changes in the periventricular white matter. There is no mass effect, midline shift, or acute hemorrhage. Vascular: No hyperdense vessel or unexpected calcification. Skull: Cranium is intact. Sinuses/Orbits: Mastoid air cells are clear. Visualized paranasal sinuses are clear. Orbits are within normal limits. Other: Noncontributory CT CERVICAL SPINE FINDINGS Alignment: There  is anatomic alignment. Skull base and vertebrae: There is no vertebral compression deformity. No obvious fracture. No dislocation. Soft tissues and spinal canal: No obvious spinal canal hematoma. There is no obvious soft tissue hematoma in the neck. Thyroid is unremarkable. Atherosclerotic calcifications of the carotids bilaterally. Disc levels: There are bridging anterior osteophytes throughout the cervical spine. There is also ankylosis of the posterior elements in the upper cervical spine. Left-sided uncovertebral osteophytes cause some left foraminal narrowing at C3-4. There are prominent posterior disc osteophytes at C4-5. This results in an element of central spinal stenosis. Left  paracentral and foraminal osteophytes at C6-7 result in severe left foraminal narrowing. Upper chest: There is hazy ground-glass in the visualized left upper lobe. Other: Noncontributory IMPRESSION: No acute intracranial pathology. Chronic ischemic changes are noted. No evidence of cervical spine injury. Degenerative changes are noted above. Hazy ground-glass opacity in the left upper lobe. Electronically Signed   By: Marybelle Killings M.D.   On: 01/21/2018 20:02   Ct Cervical Spine Wo Contrast  Result Date: 01/21/2018 CLINICAL DATA:  Fall EXAM: CT HEAD WITHOUT CONTRAST CT CERVICAL SPINE WITHOUT CONTRAST TECHNIQUE: Multidetector CT imaging of the head and cervical spine was performed following the standard protocol without intravenous contrast. Multiplanar CT image reconstructions of the cervical spine were also generated. COMPARISON:  05/21/2017 FINDINGS: CT HEAD FINDINGS Brain: Global atrophy. There are mild chronic ischemic changes in the periventricular white matter. There is no mass effect, midline shift, or acute hemorrhage. Vascular: No hyperdense vessel or unexpected calcification. Skull: Cranium is intact. Sinuses/Orbits: Mastoid air cells are clear. Visualized paranasal sinuses are clear. Orbits are within normal limits. Other: Noncontributory CT CERVICAL SPINE FINDINGS Alignment: There is anatomic alignment. Skull base and vertebrae: There is no vertebral compression deformity. No obvious fracture. No dislocation. Soft tissues and spinal canal: No obvious spinal canal hematoma. There is no obvious soft tissue hematoma in the neck. Thyroid is unremarkable. Atherosclerotic calcifications of the carotids bilaterally. Disc levels: There are bridging anterior osteophytes throughout the cervical spine. There is also ankylosis of the posterior elements in the upper cervical spine. Left-sided uncovertebral osteophytes cause some left foraminal narrowing at C3-4. There are prominent posterior disc osteophytes at  C4-5. This results in an element of central spinal stenosis. Left paracentral and foraminal osteophytes at C6-7 result in severe left foraminal narrowing. Upper chest: There is hazy ground-glass in the visualized left upper lobe. Other: Noncontributory IMPRESSION: No acute intracranial pathology. Chronic ischemic changes are noted. No evidence of cervical spine injury. Degenerative changes are noted above. Hazy ground-glass opacity in the left upper lobe. Electronically Signed   By: Marybelle Killings M.D.   On: 01/21/2018 20:02   Dg Pelvis Portable  Result Date: 01/21/2018 CLINICAL DATA:  84 year old male with history of trauma from a fall. Pain in the left hip. EXAM: PORTABLE PELVIS 1-2 VIEWS COMPARISON:  Left hip radiograph 09/24/2017. FINDINGS: Two portable views of the pelvis demonstrate what appears to be subtle discontinuity of the left superior and inferior pubic rami, concerning for minimally displaced fractures. Bilateral proximal femurs as visualized appear intact and the femoral heads appear located on these frontal projections. IMPRESSION: 1. Probable fractures of the left superior and inferior pubic rami, poorly evaluated on today's portable frontal views. These could be confirmed with noncontrast CT the pelvis if clinically appropriate. Electronically Signed   By: Vinnie Langton M.D.   On: 01/21/2018 19:40   Dg Chest Portable 1 View  Result Date: 01/21/2018 CLINICAL DATA:  Fall  EXAM: PORTABLE CHEST 1 VIEW COMPARISON:  09/10/2017 FINDINGS: The heart remains moderately enlarged. Aortic valve replacement hardware is stable in position. Normal vascularity. No pneumothorax or pleural effusion. There is no evidence of consolidation. IMPRESSION: Cardiomegaly without decompensation. Electronically Signed   By: Marybelle Killings M.D.   On: 01/21/2018 19:39   Telemetry    01/23/18 Atrial fibrillation - Personally Reviewed  ECG    No new tracing as of 01/23/18 - Personally Reviewed  Cardiac Studies    Echo: 10/24/17  Study Conclusions  - Left ventricle: The cavity size was normal. There was mild concentric hypertrophy. Systolic function was normal. The estimated ejection fraction was in the range of 55% to 60%. Wall motion was normal; there were no regional wall motion abnormalities. - Aortic valve: A TAVR Edwards Sapien 3 THV (size 26 mm, model # 9600TFX, serial # [id number] was present. There was trivial perivalvular regurgitation. - Mitral valve: Moderately calcified annulus. Mildly thickened, moderately calcified leaflets . Moderate prolapse, involving the posterior leaflet, partially flail. The findings are consistent with moderate stenosis. There was severe regurgitation directed eccentrically and posteriorly. Mean gradient (D): 9 mm Hg. - Left atrium: The atrium was severely dilated. - Right atrium: The atrium was severely dilated. - Tricuspid valve: There was mild regurgitation. - Pulmonary arteries: Systolic pressure was mildly increased. PA peak pressure: 42 mm Hg (S).  Impressions:  - No significant change in mitral regurgitation.  Patient Profile     84 y.o. male PMH of AS s/p TAVR, MR (non felt to be a candidate for mitraclip) Afib on coumadin, CAD s/p PCI OM who presented after a mechanical fall at home. Now with hematoma and fracture of the left pubic ramus.   Assessment & Plan    1. S/p fall with left pubis ramus fx with orthostasis:  -Reports a mechanical fall while getting up from his chair with no reported LOC followed by ortho with plans to manage conservatively at this time>>awaiting placement for SNF -Attempted to work with PT with dropped his systolic pressure to 70 with standing>> cardiology asked to advise for anticoagulation -Maintain good hydration, monitor closely with ambulation -Encouraged deep breathing and will ask RN to give IS while less active in bed   2. Permanent atrial fibrillation managed on  Coumadin therapy:  -Rate controlled on lopressor and digoxin. HR range 80-90's  -INR, 1.30 -CBC with HB of 11.0 today, 11.5 on 01/22/2018 -Consider resuming coumadin given HB stable without evidence of bleeding  3. AS s/p TAVR:  -Stable, followed by Dr. Burt Knack   Signed, Kathyrn Drown NP-C [place] Pager: [phone] 01/23/2018, 1:16 PM     For questions or updates, please contact   Please consult [email or web address] for contact info under Cardiology/STEMI.   Attending Note:   The patient was seen and examined.  Agree with assessment and plan as noted above.  Changes made to the above note as needed.  Patient seen and independently examined with  Kathyrn Drown NP .   We discussed all aspects of the encounter. I agree with the assessment and plan as stated above.  1.  Status post fall with fracture to the pubic ramus Patient had a mechanical fall while getting up from his chair.  He apparently had some bleeding around the site of his injury. Will restart coumadin today   2.  Lethargy:   Is lethargic today , have discussed with Dr. Algis Liming.   Likely due to pain meds. Consider ABG to rule out CO2 narcosis  I have spent a total of 40 minutes with patient reviewing hospital  notes , telemetry, EKGs, labs and examining patient as well as establishing an assessment and plan that was discussed with the patient. > 50% of time was spent in direct patient care.    Thayer Headings, Brooke Bonito., MD, [facility] 01/23/2018, 2:02 PM [address],  [place] Pager [phone]

## 2018-01-23 NOTE — Progress Notes (Signed)
Pt answered date of birth- [birth year], location- [facility], president- trump  Pt identified person at bedside "my beautiful wife" Education provided to family at bedside to report facial changes, slurred speech, etc  Educated pt and pt family on pain medication side effects  Provided pt grape juice  Pt wife at bedside stated "he looks better already"  Primary RN [facility] aware

## 2018-01-23 NOTE — Progress Notes (Signed)
Family states they do not want patient to return to facility he came from and requested to speak to case management.  Consult ordered

## 2018-01-23 NOTE — Progress Notes (Signed)
Ordered ted hose for pt per orthopedic MD

## 2018-01-23 NOTE — Progress Notes (Addendum)
   Subjective:    Recheck left pelvis fracture s/p recent fall Pain manageable lying in bed  Increased pain with movement Denies any new symptoms or issues  Patient reports pain as moderate.  Objective:   VITALS:   Vitals:   01/23/18 0533 01/23/18 0815  BP:  132/81  Pulse: (!) 109 96  Resp:    Temp:    SpO2: 92%     Bilateral lower extremities with good log roll rom without pain nv intact distally No rashes or edema distally  LABS Recent Labs    01/21/18 1949 01/22/18 0213 01/23/18 0457  HGB 12.2* 11.5* 11.0*  HCT 38.6* 35.6* 33.9*  WBC 8.4 8.2 9.0  PLT 151 127* 126*    Recent Labs    01/21/18 1949 01/22/18 0635 01/23/18 0457  NA 141 140 137  K 4.1 4.1 4.1  BUN 17 15 15   CREATININE 1.23 0.94 0.92  GLUCOSE 107* 125* 110*     Assessment/Plan:   Left pubic rami fractures currently stable May weight bear as tolerated but it will cause pain Patient may follow up in the office in 4 weeks once discharged to expected SNF Pt and wife in agreement Pain management Will sign off for now Please contact us for any further orthopedic concerns  As soon as safe from an orthostatic standpoint, would resume therapy.  Therapy may be able to do some therapy in bed.   Recommend TED hose and  SCDs ASAP as well.     Merla Riches PA-C, MPAS [facility] is now [employer] [facility] [address] [place], [place],  [address] Phone: [phone] [email or web address] Facebook  [employer]

## 2018-01-23 NOTE — Progress Notes (Signed)
Update provided to pt wife at bedside

## 2018-01-23 NOTE — Discharge Instructions (Addendum)
Information on my medicine - Coumadin®   (Warfarin) ° °Why was Coumadin prescribed for you? °Coumadin was prescribed for you because you have a blood clot or a medical condition that can cause an increased risk of forming blood clots. Blood clots can cause serious health problems by blocking the flow of blood to the heart, lung, or brain. Coumadin can prevent harmful blood clots from forming. °As a reminder your indication for Coumadin is:   Stroke Prevention Because Of Atrial Fibrillation ° °What test will check on my response to Coumadin? °While on Coumadin (warfarin) you will need to have an INR test regularly to ensure that your dose is keeping you in the desired range. The INR (international normalized ratio) number is calculated from the result of the laboratory test called prothrombin time (PT). ° °If an INR APPOINTMENT HAS NOT ALREADY BEEN MADE FOR YOU please schedule an appointment to have this lab work done by your health care provider within 7 days. °Your INR goal is usually a number between:  2 to 3 or your provider may give you a more narrow range like 2-2.5.  Ask your health care provider during an office visit what your goal INR is. ° °What  do you need to  know  About  COUMADIN? °Take Coumadin (warfarin) exactly as prescribed by your healthcare provider about the same time each day.  DO NOT stop taking without talking to the doctor who prescribed the medication.  Stopping without other blood clot prevention medication to take the place of Coumadin may increase your risk of developing a new clot or stroke.  Get refills before you run out. ° °What do you do if you miss a dose? °If you miss a dose, take it as soon as you remember on the same day then continue your regularly scheduled regimen the next day.  Do not take two doses of Coumadin at the same time. ° °Important Safety Information °A possible side effect of Coumadin (Warfarin) is an increased risk of bleeding. You should call your healthcare  provider right away if you experience any of the following: °? Bleeding from an injury or your nose that does not stop. °? Unusual colored urine (red or dark brown) or unusual colored stools (red or black). °? Unusual bruising for unknown reasons. °? A serious fall or if you hit your head (even if there is no bleeding). ° °Some foods or medicines interact with Coumadin® (warfarin) and might alter your response to warfarin. To help avoid this: °? Eat a balanced diet, maintaining a consistent amount of Vitamin K. °? Notify your provider about major diet changes you plan to make. °? Avoid alcohol or limit your intake to 1 drink for women and 2 drinks for men per day. °(1 drink is 5 oz. wine, 12 oz. beer, or 1.5 oz. liquor.) ° °Make sure that ANY health care provider who prescribes medication for you knows that you are taking Coumadin (warfarin).  Also make sure the healthcare provider who is monitoring your Coumadin knows when you have started a new medication including herbals and non-prescription products. ° °Coumadin® (Warfarin)  Major Drug Interactions  °Increased Warfarin Effect Decreased Warfarin Effect  °Alcohol (large quantities) °Antibiotics (esp. Septra/Bactrim, Flagyl, Cipro) °Amiodarone (Cordarone) °Aspirin (ASA) °Cimetidine (Tagamet) °Megestrol (Megace) °NSAIDs (ibuprofen, naproxen, etc.) °Piroxicam (Feldene) °Propafenone (Rythmol SR) °Propranolol (Inderal) °Isoniazid (INH) °Posaconazole (Noxafil) Barbiturates (Phenobarbital) °Carbamazepine (Tegretol) °Chlordiazepoxide (Librium) °Cholestyramine (Questran) °Griseofulvin °Oral Contraceptives °Rifampin °Sucralfate (Carafate) °Vitamin K  ° °Coumadin® (Warfarin) Major Herbal   Interactions  Increased Warfarin Effect Decreased Warfarin Effect  Garlic Ginseng Ginkgo biloba Coenzyme Q10 Green tea St. Johns wort    Coumadin (Warfarin) FOOD Interactions  Eat a consistent number of servings per week of foods HIGH in Vitamin K (1 serving =  cup)  Collards  (cooked, or boiled & drained) Kale (cooked, or boiled & drained) Mustard greens (cooked, or boiled & drained) Parsley *serving size only =  cup Spinach (cooked, or boiled & drained) Swiss chard (cooked, or boiled & drained) Turnip greens (cooked, or boiled & drained)  Eat a consistent number of servings per week of foods MEDIUM-HIGH in Vitamin K (1 serving = 1 cup)  Asparagus (cooked, or boiled & drained) Broccoli (cooked, boiled & drained, or raw & chopped) Brussel sprouts (cooked, or boiled & drained) *serving size only =  cup Lettuce, raw (green leaf, endive, romaine) Spinach, raw Turnip greens, raw & chopped   These websites have more information on Coumadin (warfarin):  FailFactory.se; VeganReport.com.au;   Additional discharge instructions:  Please get your medications reviewed and adjusted by your Primary MD.  Please request your Primary MD to go over all Hospital Tests and Procedure/Radiological results at the follow up, please get all Hospital records sent to your Prim MD by signing hospital release before you go home.  If you had Pneumonia of Lung problems at the Hospital: Please get a 2 view Chest X ray done in 6-8 weeks after hospital discharge or sooner if instructed by your Primary MD.  If you have Congestive Heart Failure: Please call your Cardiologist or Primary MD anytime you have any of the following symptoms:  1) 3 pound weight gain in 24 hours or 5 pounds in 1 week  2) shortness of breath, with or without a dry hacking cough  3) swelling in the hands, feet or stomach  4) if you have to sleep on extra pillows at night in order to breathe  Follow cardiac low salt diet and 1.5 lit/day fluid restriction.  If you have diabetes Accuchecks 4 times/day, Once in AM empty stomach and then before each meal. Log in all results and show them to your primary doctor at your next visit. If any glucose reading is under 80 or above 300 call your primary  MD immediately.  If you have Seizure/Convulsions/Epilepsy: Please do not drive, operate heavy machinery, participate in activities at heights or participate in high speed sports until you have seen by Primary MD or a Neurologist and advised to do so again.  If you had Gastrointestinal Bleeding: Please ask your Primary MD to check a complete blood count within one week of discharge or at your next visit. Your endoscopic/colonoscopic biopsies that are pending at the time of discharge, will also need to followed by your Primary MD.  Get Medicines reviewed and adjusted. Please take all your medications with you for your next visit with your Primary MD  Please request your Primary MD to go over all hospital tests and procedure/radiological results at the follow up, please ask your Primary MD to get all Hospital records sent to his/her office.  If you experience worsening of your admission symptoms, develop shortness of breath, life threatening emergency, suicidal or homicidal thoughts you must seek medical attention immediately by calling 911 or calling your MD immediately  if symptoms less severe.  You must read complete instructions/literature along with all the possible adverse reactions/side effects for all the Medicines you take and that have been prescribed to you. Take any  new Medicines after you have completely understood and accpet all the possible adverse reactions/side effects.   Do not drive or operate heavy machinery when taking Pain medications.   Do not take more than prescribed Pain, Sleep and Anxiety Medications  Special Instructions: If you have smoked or chewed Tobacco  in the last 2 yrs please stop smoking, stop any regular Alcohol  and or any Recreational drug use.  Wear Seat belts while driving.  Please note You were cared for by a hospitalist during your hospital stay. If you have any questions about your discharge medications or the care you received while you were in the  hospital after you are discharged, you can call the unit and asked to speak with the hospitalist on call if the hospitalist that took care of you is not available. Once you are discharged, your primary care physician will handle any further medical issues. Please note that NO REFILLS for any discharge medications will be authorized once you are discharged, as it is imperative that you return to your primary care physician (or establish a relationship with a primary care physician if you do not have one) for your aftercare needs so that they can reassess your need for medications and monitor your lab values.  You can reach the hospitalist office at phone [phone] or fax [phone]   If you do not have a primary care physician, you can call [phone] for a physician referral.

## 2018-01-23 NOTE — Clinical Social Work Note (Signed)
Clinical Social Work Assessment  Patient Details  Name: Wayne [name] MRN: [id number] Date of Birth: [birth year]  Date of referral:  01/23/18               Reason for consult:  Facility Placement, Discharge Planning                Permission sought to share information with:  Facility Sport and exercise psychologist, Family Supports Permission granted to share information::  Yes, Verbal Permission Granted  Name::     Wayne [name]  Agency::  SNF's  Relationship::  Wife  Contact Information:  [phone]  Housing/Transportation Living arrangements for the past 2 months:  Single Family Home Source of Information:  Patient, Medical Team, Spouse Patient Interpreter Needed:  None Criminal Activity/Legal Involvement Pertinent to Current Situation/Hospitalization:  No - Comment as needed Significant Relationships:  Adult Children, Spouse Lives with:  Spouse Do you feel safe going back to the place where you live?  Yes Need for family participation in patient care:  Yes (Comment)  Care giving concerns:  PT recommending SNF once medically stable for discharge.   Social Worker assessment / plan:  CSW met with patient. Wife at bedside. CSW introduced role and explained that PT recommendations would be discussed. Patient and his wife agreeable to SNF. Reviewed local SNF's that are in network with [facility]. [facility] is first preference.They can offer a bed and will start insurance authorization. Typically takes 24-48 hours to get authorization decision. No further concerns. CSW encouraged patient and his wife to contact CSW as needed. CSW will continue to follow patient and his wife for support and facilitate discharge to SNF once medically stable.  Employment status:  Retired Nurse, adult PT Recommendations:  [place] / Referral to community resources:  [place]  Patient/Family's Response to care:  Patient and  his wife agreeable to SNF placement. Patient's wife and daughter supportive and involved in patient's care. Patient and his wife appreciated social work intervention.  Patient/Family's Understanding of and Emotional Response to Diagnosis, Current Treatment, and Prognosis:  Patient and his wife have a good understanding of the reason for admission and his need for rehab prior to returning home. Patient and his wife appear happy with hospital care.  Emotional Assessment Appearance:  Appears stated age Attitude/Demeanor/Rapport:  Engaged, Gracious, Lethargic Affect (typically observed):  Accepting, Appropriate, Calm, Pleasant Orientation:  Oriented to Self, Oriented to Place, Oriented to  Time, Oriented to Situation Alcohol / Substance use:  Never Used Psych involvement (Current and /or in the community):  No (Comment)  Discharge Needs  Concerns to be addressed:  Care Coordination Readmission within the last 30 days:  No Current discharge risk:  Dependent with Mobility Barriers to Discharge:  Continued Medical Work up, [place], LCSW 01/23/2018, 11:45 AM

## 2018-01-23 NOTE — Progress Notes (Signed)
ANTICOAGULATION CONSULT NOTE - Initial Consult  Pharmacy Consult for Coumadin Indication: atrial fibrillation  Allergies  Allergen Reactions  . Penicillins Rash and Other (See Comments)    PATIENT HAS HAD A PCN REACTION WITH IMMEDIATE RASH, FACIAL/TONGUE/THROAT SWELLING, SOB, OR LIGHTHEADEDNESS WITH HYPOTENSION:  #  #  #  YES  #  #  #   Has patient had a PCN reaction causing severe rash involving mucus membranes or skin necrosis: No Has patient had a PCN reaction that required hospitalization No Has patient had a PCN reaction occurring within the last 10 years: No If all of the above answers are "NO", then may proceed with Cephalosporin use.  . Prednisone Other (See Comments)    Wheezing  . Procaine Hcl Other (See Comments)    Passed out after being given this at dental appointment  . Levofloxacin Rash  . Oxycodone Other (See Comments)    constipation    Patient Measurements: Height: 5' 10.5" (179.1 cm) Weight: 173 lb 6.4 oz (78.7 kg) IBW/kg (Calculated) : 74.15  Vital Signs: Temp: 99.1 F (37.3 C) (08/07 1236) Temp Source: Oral (08/07 1236) BP: 105/64 (08/07 1247) Pulse Rate: 94 (08/07 1247)  Labs: Recent Labs    01/21/18 1949 01/22/18 0213 01/22/18 0635 01/23/18 0457  HGB 12.2* 11.5*  --  11.0*  HCT 38.6* 35.6*  --  33.9*  PLT 151 127*  --  126*  LABPROT 18.2*  --   --  16.1*  INR 1.52  --   --  1.30  CREATININE 1.23  --  0.94 0.92  CKTOTAL  --  163  --   --     Estimated Creatinine Clearance: 60.5 mL/min (by C-G formula based on SCr of 0.92 mg/dL).   Medical History: Past Medical History:  Diagnosis Date  . Aortic stenosis   . Arthritis   . Atrial fibrillation (HCC)    Chronic  . Atrial fibrillation, chronic ([place])   . BPH (benign prostatic hyperplasia)   . CAD (coronary artery disease)   . Chronic anticoagulation   . History of chicken pox   . HTN (hypertension)   . Mitral stenosis   . Old MI (myocardial infarction) 2007   s/p PCI to distal OM  (no stent)  . Severe mitral regurgitation   . 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 CHA2DS2-VASc elevated. No surgery planned. CT pelvis in ER shows inferior and superior pubic rami fractures as well as small hematomas. INR 1.3. Hgb with slight drop 12.2>11.5>11. - PTA Coumadin 2.5mg  TThS, 5mg  MWFSun,Admit INR 1.52  Goal of Therapy:  INR 2-3 Monitor platelets by anticoagulation protocol: Yes   Plan:  Coumadin 5mg  po x 1 tonight Daily INR   Tyrea Froberg S. Alford Highland, PharmD, BCPS Clinical Staff Pharmacist Pager [phone]  Eilene Ghazi Stillinger 01/23/2018,3:27 PM

## 2018-01-23 NOTE — Progress Notes (Signed)
MD returned page, aware of pt responses slow yet appropriate  Pt wife states she does not feel like this is his baseline MD stated to try and wean opioids and cover with tylenol if pt tolerates. MD stated to call back if there is a focal deficit, primary RN [facility] aware Will continue to monitor

## 2018-01-23 NOTE — Progress Notes (Signed)
PROGRESS NOTE    Wayne [name]  MGQ:[id number] DOB: 05/15/1930 DOA: 01/21/2018 PCP: [name], DO      Brief Narrative:  Wayne [name] is a 84 y.o. M with Afib on warfarin, AS s/p AVR bioprosthetic, HTN, coronary disease and hx of stroke who presented with fall and hip pain.  CT pelvis in ER shows inferior and superior pubic rami fractures as well as small hematomas.  Admitted for anticoagulation management in setting of hematomas.  Orthopedics and Cardiology consulted.   Assessment & Plan:  Pelvic hematoma Acute blood loss anemia Hemodynamically stable overnight.  Only small initial drop in hemoglobin (Hb 12.2>11.5>11).  Orthostatic today but no significant tachycardia. -As per nursing, unable to do standing orthostatics due to pain.  Sitting orthostatic checks were apparently negative. -Mild drop in hemoglobin but relatively stable.  Follow CBC in a.m.  Thrombocytopenia Possibly due to acute blood loss.  Stable.  Follow CBCs.  Left minimally displaced superior and inferior pubic ramus fractures -Orthopedic consultation and follow-up appreciated.  I discussed with orthopedic/Brad Dixon, PA-C who indicates that patient's hematoma is small and should resolve on its own and okay to start Coumadin anticoagulation.  Weightbearing as tolerated and outpatient follow-up with orthopedics in 4 weeks.  I also discussed with radiologist who reviewed images from 8/5 and indicated that patient had small amount of hemorrhage. Minimize opioids due to mental status changes.  Tylenol.  Orthostatic hypotension -May be complicated by opioids, minimize.  Encourage oral fluid intake.  Atrial fibrillation, chronic CHA2DS2-Vasc 5 (age, HTN, stroke).  On Warfarin, INR subtherapeutic, warfarin held at admission.  Given history of stroke, and relative stability of pelvic hematomas normally, would favor restarting warfarin -Consult Cardiology re: stroke risk.  Cardiology input appreciated.   Discussed with Dr. Acie Fredrickson.  Initiating Coumadin 8/7. -Trend Hgb -Continue metoprolol, digoxin  History of TAVR Chronic mitral regurgitation  Coronary disease -Continue BB  Other medications -Continue alfuzosin -Continue finasteride   Mental status changes Possibly due to opioids, minimize.  DC Ambien.  Check ABG to rule out CO2 retention, seems less likely.  No focal neurological deficits.  Monitor closely.  CT head and neck 8/5 without acute findings.      DVT prophylaxis: N/A on warfarin Code Status: DO NOT RESUSCITATE Family Communication: Discussed in detail with patient's spouse at bedside. MDM and disposition Plan: DC to SNF pending medical improvement and stability.    Consultants:   Cardiology  Orthopedics  Procedures:   None  Antimicrobials:   None    Subjective: Seen this morning. Spouse at bedside. Currently denies pain. Apparently had pelvic pain this morning and received pain medications.  Denies dizziness, lightheadedness, chest pain, dyspnea or palpitations.  Objective: Vitals:   01/23/18 0815 01/23/18 1236 01/23/18 1247 01/23/18 1252  BP: 132/81 116/63 105/64   Pulse: 96 94 94   Resp:  18    Temp:  99.1 F (37.3 C)    TempSrc:  Oral    SpO2:  91% 91% 96%  Weight:      Height:        Intake/Output Summary (Last 24 hours) at 01/23/2018 1450 Last data filed at 01/23/2018 0900 Gross per 24 hour  Intake 200 ml  Output 150 ml  Net 50 ml   Filed Weights   01/21/18 1843 01/22/18 0204 01/23/18 0415  Weight: 79.8 kg (176 lb) 79.9 kg (176 lb 2.4 oz) 78.7 kg (173 lb 6.4 oz)    Examination: General appearance: Pleasant elderly male, moderately built  and thinly nourished lying comfortably propped up in bed and does not appear in painful distress. Cardiac: S1 and S2 heard, irregularly irregular.  No JVD, murmurs or pedal edema.  Telemetry personally reviewed and shows A. fib with controlled ventricular rate. Respiratory: Clear to auscultation.   No increased work of breathing. Abdomen: Abdomen is nondistended, soft and nontender.  No organomegaly or masses appreciated.  Normal bowel sounds heard. MSK: No deformities or effusions, but he guards the left leg, also right leg movement is somewhat limited.  No new findings/stable.  Bruising of right upper extremity which his wife indicates has been present even prior to fall. Neuro: Alert this morning and oriented x2.  No focal neurological deficits.    Psych: Flat affect.  Judgment and insight impaired.    Data Reviewed: I have personally reviewed following labs and imaging studies:  CBC: Recent Labs  Lab 01/21/18 1949 01/22/18 0213 01/23/18 0457  WBC 8.4 8.2 9.0  NEUTROABS 6.5  --   --   HGB 12.2* 11.5* 11.0*  HCT 38.6* 35.6* 33.9*  MCV 94.1 92.5 93.4  PLT 151 127* 026*   Basic Metabolic Panel: Recent Labs  Lab 01/21/18 1949 01/22/18 0635 01/23/18 0457  NA 141 140 137  K 4.1 4.1 4.1  CL 102 104 102  CO2 32 26 26  GLUCOSE 107* 125* 110*  BUN 17 15 15   CREATININE 1.23 0.94 0.92  CALCIUM 9.0 8.5* 8.4*   GFR: Estimated Creatinine Clearance: 60.5 mL/min (by C-G formula based on SCr of 0.92 mg/dL). Liver Function Tests: Recent Labs  Lab 01/21/18 1949  AST 24  ALT 14  ALKPHOS 39  BILITOT 1.0  PROT 6.6  ALBUMIN 3.7   Coagulation Profile: Recent Labs  Lab 01/21/18 1949 01/23/18 0457  INR 1.52 1.30   Cardiac Enzymes: Recent Labs  Lab 01/22/18 0213  CKTOTAL 163   CBG: Recent Labs  Lab 01/23/18 1246  GLUCAP 91      Radiology Studies: Ct Abdomen Pelvis Wo Contrast  Result Date: 01/21/2018 CLINICAL DATA:  Status post fall backwards, with left pelvic tenderness. Initial encounter. EXAM: CT ABDOMEN AND PELVIS WITHOUT CONTRAST TECHNIQUE: Multidetector CT imaging of the abdomen and pelvis was performed following the standard protocol without IV contrast. COMPARISON:  CTA of the abdomen and pelvis performed 08/06/2017 FINDINGS: Lower chest: Small  bilateral pleural effusions are noted, with bibasilar atelectasis. A valve replacement is noted at the aortic valve. Diffuse coronary artery calcifications are seen. Hepatobiliary: The liver is unremarkable in appearance. The gallbladder is unremarkable in appearance. The common bile duct remains normal in caliber. Pancreas: The pancreas is within normal limits. Spleen: The spleen is unremarkable in appearance. Adrenals/Urinary Tract: The adrenal glands are unremarkable in appearance. A small left renal cyst is noted. Nonspecific perinephric stranding is noted bilaterally. There is no evidence of hydronephrosis. No renal or ureteral stones are identified. Stomach/Bowel: The stomach is unremarkable in appearance. The small bowel is within normal limits. The appendix is normal in caliber, without evidence of appendicitis. The colon is unremarkable in appearance. Vascular/Lymphatic: Diffuse calcification is seen along the abdominal aorta and its branches. The abdominal aorta is otherwise grossly unremarkable. The inferior vena cava is grossly unremarkable. No retroperitoneal lymphadenopathy is seen. No pelvic sidewall lymphadenopathy is identified. Reproductive: The bladder is mildly distended, with a left-sided diverticulum. The prostate is normal in size. Other: Soft tissue hemorrhage is noted along the space of Retzius, anterior to the bladder, and tracking along the left pelvic sidewall,  reflecting the overlying left pubic rami fractures. There is also diffuse soft tissue hemorrhage along the left pectineus and adductor musculature. Musculoskeletal: There are mildly displaced and mildly comminuted fractures of the left superior and inferior pubic rami. No additional fractures are seen. There is mild chronic loss of height at vertebral body L3. IMPRESSION: 1. Mildly displaced and mildly comminuted fractures of the left superior and inferior pubic rami. 2. Soft tissue hemorrhage along the space of Retzius, anterior  to the bladder, and tracking along the left pelvic sidewall, reflecting the overlying left pubic rami fractures. 3. Diffuse soft tissue hemorrhage along the left pectineus and adductor musculature. 4. Small bilateral pleural effusions, with bibasilar atelectasis. 5. Diffuse coronary artery calcifications seen. 6. Small left renal cyst noted. Aortic Atherosclerosis (ICD10-I70.0). These results were called by telephone at the time of interpretation on 01/21/2018 at 10:58 pm to Dr. Corrie Dandy , who verbally acknowledged these results. Electronically Signed   By: Garald Balding M.D.   On: 01/21/2018 23:00   Ct Head Wo Contrast  Result Date: 01/21/2018 CLINICAL DATA:  Fall EXAM: CT HEAD WITHOUT CONTRAST CT CERVICAL SPINE WITHOUT CONTRAST TECHNIQUE: Multidetector CT imaging of the head and cervical spine was performed following the standard protocol without intravenous contrast. Multiplanar CT image reconstructions of the cervical spine were also generated. COMPARISON:  05/21/2017 FINDINGS: CT HEAD FINDINGS Brain: Global atrophy. There are mild chronic ischemic changes in the periventricular white matter. There is no mass effect, midline shift, or acute hemorrhage. Vascular: No hyperdense vessel or unexpected calcification. Skull: Cranium is intact. Sinuses/Orbits: Mastoid air cells are clear. Visualized paranasal sinuses are clear. Orbits are within normal limits. Other: Noncontributory CT CERVICAL SPINE FINDINGS Alignment: There is anatomic alignment. Skull base and vertebrae: There is no vertebral compression deformity. No obvious fracture. No dislocation. Soft tissues and spinal canal: No obvious spinal canal hematoma. There is no obvious soft tissue hematoma in the neck. Thyroid is unremarkable. Atherosclerotic calcifications of the carotids bilaterally. Disc levels: There are bridging anterior osteophytes throughout the cervical spine. There is also ankylosis of the posterior elements in the upper cervical  spine. Left-sided uncovertebral osteophytes cause some left foraminal narrowing at C3-4. There are prominent posterior disc osteophytes at C4-5. This results in an element of central spinal stenosis. Left paracentral and foraminal osteophytes at C6-7 result in severe left foraminal narrowing. Upper chest: There is hazy ground-glass in the visualized left upper lobe. Other: Noncontributory IMPRESSION: No acute intracranial pathology. Chronic ischemic changes are noted. No evidence of cervical spine injury. Degenerative changes are noted above. Hazy ground-glass opacity in the left upper lobe. Electronically Signed   By: Marybelle Killings M.D.   On: 01/21/2018 20:02   Ct Cervical Spine Wo Contrast  Result Date: 01/21/2018 CLINICAL DATA:  Fall EXAM: CT HEAD WITHOUT CONTRAST CT CERVICAL SPINE WITHOUT CONTRAST TECHNIQUE: Multidetector CT imaging of the head and cervical spine was performed following the standard protocol without intravenous contrast. Multiplanar CT image reconstructions of the cervical spine were also generated. COMPARISON:  05/21/2017 FINDINGS: CT HEAD FINDINGS Brain: Global atrophy. There are mild chronic ischemic changes in the periventricular white matter. There is no mass effect, midline shift, or acute hemorrhage. Vascular: No hyperdense vessel or unexpected calcification. Skull: Cranium is intact. Sinuses/Orbits: Mastoid air cells are clear. Visualized paranasal sinuses are clear. Orbits are within normal limits. Other: Noncontributory CT CERVICAL SPINE FINDINGS Alignment: There is anatomic alignment. Skull base and vertebrae: There is no vertebral compression deformity. No obvious fracture. No  dislocation. Soft tissues and spinal canal: No obvious spinal canal hematoma. There is no obvious soft tissue hematoma in the neck. Thyroid is unremarkable. Atherosclerotic calcifications of the carotids bilaterally. Disc levels: There are bridging anterior osteophytes throughout the cervical spine. There is  also ankylosis of the posterior elements in the upper cervical spine. Left-sided uncovertebral osteophytes cause some left foraminal narrowing at C3-4. There are prominent posterior disc osteophytes at C4-5. This results in an element of central spinal stenosis. Left paracentral and foraminal osteophytes at C6-7 result in severe left foraminal narrowing. Upper chest: There is hazy ground-glass in the visualized left upper lobe. Other: Noncontributory IMPRESSION: No acute intracranial pathology. Chronic ischemic changes are noted. No evidence of cervical spine injury. Degenerative changes are noted above. Hazy ground-glass opacity in the left upper lobe. Electronically Signed   By: Marybelle Killings M.D.   On: 01/21/2018 20:02   Dg Pelvis Portable  Result Date: 01/21/2018 CLINICAL DATA:  84 year old male with history of trauma from a fall. Pain in the left hip. EXAM: PORTABLE PELVIS 1-2 VIEWS COMPARISON:  Left hip radiograph 09/24/2017. FINDINGS: Two portable views of the pelvis demonstrate what appears to be subtle discontinuity of the left superior and inferior pubic rami, concerning for minimally displaced fractures. Bilateral proximal femurs as visualized appear intact and the femoral heads appear located on these frontal projections. IMPRESSION: 1. Probable fractures of the left superior and inferior pubic rami, poorly evaluated on today's portable frontal views. These could be confirmed with noncontrast CT the pelvis if clinically appropriate. Electronically Signed   By: Vinnie Langton M.D.   On: 01/21/2018 19:40   Dg Chest Portable 1 View  Result Date: 01/21/2018 CLINICAL DATA:  Fall EXAM: PORTABLE CHEST 1 VIEW COMPARISON:  09/10/2017 FINDINGS: The heart remains moderately enlarged. Aortic valve replacement hardware is stable in position. Normal vascularity. No pneumothorax or pleural effusion. There is no evidence of consolidation. IMPRESSION: Cardiomegaly without decompensation. Electronically Signed    By: Marybelle Killings M.D.   On: 01/21/2018 19:39        Scheduled Meds: . alfuzosin  10 mg Oral Q breakfast  . cholecalciferol  2,000 Units Oral Daily  . digoxin  125 mcg Oral Daily  . docusate sodium  100 mg Oral QPM  . finasteride  5 mg Oral QHS  . Melatonin  6 mg Oral QHS  . metoprolol tartrate  12.5 mg Oral BID  . multivitamin with minerals  1 tablet Oral Daily  . polyethylene glycol  17 g Oral Daily   Continuous Infusions:   LOS: 1 day    Time spent: 25 minutes   Vernell Leep, MD, FACP, [facility]. [facility] Pager [phone]  If 7PM-7AM, please contact night-coverage [email or web address] Password [email or web address] 01/23/2018, 3:10 PM

## 2018-01-23 NOTE — NC FL2 (Signed)
[place] MEDICAID FL2 LEVEL OF CARE SCREENING TOOL     IDENTIFICATION  Patient Name: Wayne [name] Birthdate: 1930/04/07 Sex: male Admission Date (Current Location): 01/21/2018  [facility] and [place] Number:  Herbalist and Address:  The . [facility], [place] [address] [place], [place] [address]      Provider Number: [id number]  Attending Physician Name and Address:  Modena Jansky, MD  Relative Name and Phone Number:       Current Level of Care: Hospital Recommended Level of Care: [place] Prior Approval Number:    Date Approved/Denied:   PASRR Number: [id number]  Discharge Plan: SNF    Current Diagnoses: Patient Active Problem List   Diagnosis Date Noted  . Fall 01/21/2018  . Closed fracture of left inferior pubic ramus ([place]) 01/21/2018  . Constipation 12/19/2017  .  hernia of linea alba 12/19/2017  . Osteoarthritis of hip 09/24/2017  . Gastroenteritis 09/10/2017  . S/P TAVR (transcatheter aortic valve replacement) 09/04/2017  . Severe mitral regurgitation   . Insomnia 07/10/2017  . High risk medication use 07/10/2017  . Long term current use of anticoagulant 07/10/2017  . Chronic diastolic CHF (congestive heart failure) ([place]) 05/20/2017  . Hyponatremia 05/20/2017  . Severe aortic stenosis 05/20/2017  . Urinary retention   . 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 Spinal stenosis at L4-L5 level 04/26/2017  . Lumbar radiculopathy, chronic 04/26/2017  . Benign colonic polyp- 25 yrs ago.  11/16/2016  . Vitamin D deficiency 11/16/2016  . H/O non anemic vitamin B12 deficiency 11/16/2016  . Chronic Colonic dysmotility 11/16/2016  . Hypokalemia 09/13/2016  . BPH with obstruction/lower urinary tract symptoms 09/13/2016  . Urinary frequency 07/10/2016  . Left bundle branch block (LBBB) on electrocardiogram 06/30/2016  . History of stroke 06/30/2016  . Generalized weakness 06/30/2016   . Osteoarthritis 04/04/2016  . Encounter for therapeutic drug monitoring 08/06/2013  . Chronic anticoagulation   . HTN (hypertension)   . CAD (coronary artery disease)   . Chronic atrial fibrillation ([place]) 09/13/2010  . h/o CVA (cerebrovascular accident due to intracerebral hemorrhage) 09/13/2010    Orientation RESPIRATION BLADDER Height & Weight     Self, Time, Situation, Place  Normal Incontinent, External catheter Weight: 173 lb 6.4 oz (78.7 kg) Height:  5' 10.5" (179.1 cm)  BEHAVIORAL SYMPTOMS/MOOD NEUROLOGICAL BOWEL NUTRITION STATUS  (None) (None) Continent Diet(Heart healthy)  AMBULATORY STATUS COMMUNICATION OF NEEDS Skin   Extensive Assist Verbally Bruising, Other (Comment)(Skin tear.)                       Personal Care Assistance Level of Assistance  Bathing, Feeding, Dressing Bathing Assistance: Maximum assistance Feeding assistance: Limited assistance Dressing Assistance: Maximum assistance     Functional Limitations Info  Sight, Hearing, Speech Sight Info: Adequate Hearing Info: Adequate Speech Info: Adequate    SPECIAL CARE FACTORS FREQUENCY  PT (By licensed PT), OT (By licensed OT), Blood pressure     PT Frequency: 5 x week OT Frequency: 5 x week            Contractures Contractures Info: Not present    Additional Factors Info  Code Status, Allergies Code Status Info: DNR Allergies Info: Penicillins, Prednisone, Procaine Hcl, Levofloxacin, Oxycodone           Current Medications (01/23/2018):  This is the current hospital active medication list Current Facility-Administered Medications  Medication Dose Route Frequency Provider Last Rate  Last Dose  . acetaminophen (TYLENOL) tablet 650 mg  650 mg Oral Q6H PRN Ivor Costa, MD      . alfuzosin (UROXATRAL) 24 hr tablet 10 mg  10 mg Oral Q breakfast Ivor Costa, MD   10 mg at 01/23/18 0817  . cholecalciferol (VITAMIN D) tablet 2,000 Units  2,000 Units Oral Daily Ivor Costa, MD   2,000 Units at  01/23/18 [phone]  . dextromethorphan-guaiFENesin (MUCINEX DM) 30-600 MG per 12 hr tablet 1-2 tablet  1-2 tablet Oral Q12H PRN Ivor Costa, MD      . digoxin Fonnie Birkenhead) tablet 125 mcg  125 mcg Oral Daily Ivor Costa, MD   125 mcg at 01/23/18 0816  . docusate sodium (COLACE) capsule 100 mg  100 mg Oral QPM Ivor Costa, MD   100 mg at 01/22/18 1547  . finasteride (PROSCAR) tablet 5 mg  5 mg Oral QHS Ivor Costa, MD   5 mg at 01/22/18 2146  . hydrALAZINE (APRESOLINE) injection 5 mg  5 mg Intravenous Q2H PRN Ivor Costa, MD      . HYDROcodone-acetaminophen (NORCO/VICODIN) 5-325 MG per tablet 1 tablet  1 tablet Oral Q4H PRN Ivor Costa, MD   1 tablet at 01/23/18 0816  . Melatonin TABS 6 mg  6 mg Oral QHS Ivor Costa, MD   6 mg at 01/22/18 2147  . methocarbamol (ROBAXIN) tablet 500 mg  500 mg Oral PRN Ivor Costa, MD      . metoprolol tartrate (LOPRESSOR) tablet 12.5 mg  12.5 mg Oral BID Ivor Costa, MD   12.5 mg at 01/23/18 0816  . morphine 4 MG/ML injection 0.52 mg  0.52 mg Intravenous Q3H PRN Ivor Costa, MD   0.52 mg at 01/23/18 0405  . multivitamin with minerals tablet 1 tablet  1 tablet Oral Daily Danford, Suann Larry, MD   1 tablet at 01/23/18 0816  . MUSCLE RUB CREA   Topical TID PRN Ivor Costa, MD      . ondansetron [facility]) injection 4 mg  4 mg Intravenous Q8H PRN Ivor Costa, MD      . polyethylene glycol (MIRALAX / GLYCOLAX) packet 17 g  17 g Oral Daily Ivor Costa, MD   17 g at 01/23/18 0817  . sodium chloride (OCEAN) 0.65 % nasal spray 1 spray  1 spray Each Nare Daily PRN Ivor Costa, MD      . sodium phosphate (FLEET) 7-19 GM/118ML enema 1 enema  1 enema Rectal Once PRN Ivor Costa, MD      . triamcinolone cream (KENALOG) 0.1 % 1 application  1 application Topical Daily PRN Ivor Costa, MD      . zolpidem (AMBIEN) tablet 5 mg  5 mg Oral QHS PRN Ivor Costa, MD         Discharge Medications: Please see discharge summary for a list of discharge medications.  Relevant Imaging Results:  Relevant Lab  Results:   Additional Information SS#: [id number]  Candie Chroman, LCSW

## 2018-01-23 NOTE — Care Management Note (Signed)
Case Management Note  Patient Details  Name: Wayne [name] MRN: [id number] Date of Birth: 1930-02-01  Subjective/Objective:    Fall Fractured  Pelvis with hemorrhabe               Action/Plan: Patient lived at home independent prior to admission; PCP: [name], DO; has private insurance with [employer]; CM will continue to follow for progression of care.   Expected Discharge Date:  01/28/18               Expected Discharge Plan:  [place]  Discharge planning Services  CM Consult  Status of Service:  In process, will continue to follow  Sherrilyn Rist [phone] 01/23/2018, 10:21 AM

## 2018-01-24 DIAGNOSIS — W19XXXS Unspecified fall, sequela: Secondary | ICD-10-CM

## 2018-01-24 DIAGNOSIS — I951 Orthostatic hypotension: Secondary | ICD-10-CM

## 2018-01-24 DIAGNOSIS — G9341 Metabolic encephalopathy: Secondary | ICD-10-CM

## 2018-01-24 LAB — CBC
HCT: 34.3 % — ABNORMAL LOW (ref 39.0–52.0)
Hemoglobin: 11 g/dL — ABNORMAL LOW (ref 13.0–17.0)
MCH: 30.3 pg (ref 26.0–34.0)
MCHC: 32.1 g/dL (ref 30.0–36.0)
MCV: 94.5 fL (ref 78.0–100.0)
PLATELETS: 128 10*3/uL — AB (ref 150–400)
RBC: 3.63 MIL/uL — AB (ref 4.22–5.81)
RDW: 15.3 % (ref 11.5–15.5)
WBC: 8.6 10*3/uL (ref 4.0–10.5)

## 2018-01-24 LAB — BASIC METABOLIC PANEL
ANION GAP: 9 (ref 5–15)
BUN: 14 mg/dL (ref 8–23)
CO2: 27 mmol/L (ref 22–32)
Calcium: 8.4 mg/dL — ABNORMAL LOW (ref 8.9–10.3)
Chloride: 101 mmol/L (ref 98–111)
Creatinine, Ser: 0.8 mg/dL (ref 0.61–1.24)
Glucose, Bld: 98 mg/dL (ref 70–99)
POTASSIUM: 4.1 mmol/L (ref 3.5–5.1)
SODIUM: 137 mmol/L (ref 135–145)

## 2018-01-24 LAB — PROTIME-INR
INR: 1.26
PROTHROMBIN TIME: 15.7 s — AB (ref 11.4–15.2)

## 2018-01-24 MED ORDER — BISACODYL 10 MG RE SUPP
10.0000 mg | Freq: Every day | RECTAL | Status: DC | PRN
  Administered 2018-01-24: 10 mg via RECTAL
  Filled 2018-01-24: qty 1

## 2018-01-24 MED ORDER — SENNA 8.6 MG PO TABS
2.0000 | ORAL_TABLET | Freq: Every day | ORAL | Status: DC
  Administered 2018-01-24 – 2018-01-28 (×5): 17.2 mg via ORAL
  Filled 2018-01-24 (×5): qty 2

## 2018-01-24 MED ORDER — ACETAMINOPHEN 500 MG PO TABS
1000.0000 mg | ORAL_TABLET | Freq: Three times a day (TID) | ORAL | Status: DC | PRN
  Administered 2018-01-24 – 2018-01-26 (×5): 1000 mg via ORAL
  Filled 2018-01-24 (×5): qty 2

## 2018-01-24 MED ORDER — POLYETHYLENE GLYCOL 3350 17 G PO PACK
17.0000 g | PACK | Freq: Two times a day (BID) | ORAL | Status: DC
  Administered 2018-01-25 – 2018-01-28 (×7): 17 g via ORAL
  Filled 2018-01-24 (×7): qty 1

## 2018-01-24 MED ORDER — WARFARIN SODIUM 3 MG PO TABS
6.0000 mg | ORAL_TABLET | Freq: Once | ORAL | Status: AC
  Administered 2018-01-24: 6 mg via ORAL
  Filled 2018-01-24: qty 2

## 2018-01-24 NOTE — Progress Notes (Addendum)
PROGRESS NOTE    Wayne [name]  QQI:[id number] DOB: 1930/06/14 DOA: 01/21/2018 PCP: [name], DO      Brief Narrative:  Wayne [name] is a 84 y.o. M with Afib on warfarin, AS s/p AVR bioprosthetic, HTN, coronary disease and hx of stroke who presented with fall and hip pain.  CT pelvis in ER shows inferior and superior pubic rami fractures as well as small hematomas.  Admitted for anticoagulation management in setting of hematomas.  Orthopedics and Cardiology consulted.  Slowly improving.  Eventual discharge to SNF for rehab.   Assessment & Plan:  Pelvic hematoma Acute blood loss anemia Hemoglobin has stabilized.  Ongoing orthostatic changes.  Thrombocytopenia Possibly due to acute blood loss.  Remains stable.  Periodically follow CBCs.  Left minimally displaced superior and inferior pubic ramus fractures -Orthopedic consultation and follow-up appreciated.  I discussed with orthopedic/Brad Dixon, PA-C on 8/7 who indicatet that patient's hematoma is small and should resolve on its own and okay to start Coumadin anticoagulation.  Weightbearing as tolerated and outpatient follow-up with orthopedics in 4 weeks.  I also discussed with radiologist on 8/7 who reviewed images from 8/5 and indicated that patient had small amount of hemorrhage. As per patient and spouse today, they do not wish to have any opioids, same discontinued.  Patient uses Tylenol 1 g at home, adjusted Tylenol dose.  Orthostatic hypotension -May be complicated by opioids which were discontinued.  Encouraged oral fluid intake.  Ordered Ted hoses, discussed with RN.  Atrial fibrillation, chronic CHA2DS2-Vasc 5 (age, HTN, stroke).  On Warfarin, INR subtherapeutic, warfarin held at admission.  Given history of stroke, and relative stability of pelvic hematomas, warfarin resumed 8/7 -Cardiology follow-up appreciated. -Continue metoprolol, digoxin  History of TAVR Chronic mitral regurgitation  Coronary  disease -Continue BB  Other medications -Continue alfuzosin -Continue finasteride.  These medications may be contributing some to orthostatic hypotension but stopping them may make his urinary issues worse.   Mental status changes Possibly due to opioids, discontinued 8/8.  DC Ambien.  No CO2 narcosis.  No focal neurological deficits.  Monitor closely.  CT head and neck 8/5 without acute findings.  Mental status significantly improved but not yet at baseline.  Constipation No BM since admission.  Adjusted bowel regimen.  Complicated by immobility and poor oral intake.  Cough Reportedly had cough on drinking liquids last night.  Speech therapy evaluated and recommended regular diet and thin liquids.    DVT prophylaxis: N/A on warfarin Code Status: DO NOT RESUSCITATE Family Communication: Discussed in detail with patient's spouse at bedside.  Updated care. MDM and disposition Plan: DC to SNF pending medical improvement and stability.    Consultants:   Cardiology  Orthopedics  Procedures:   None  Antimicrobials:   None    Subjective: Patient interviewed this morning with spouse at bedside.  As per spouse, mental status is 75% better.  Alert, ate breakfast by himself and worked some with PT.  Pain controlled on Tylenol alone.  They wished to stop all narcotics.  Objective: Vitals:   01/23/18 2220 01/24/18 0556 01/24/18 0926 01/24/18 1206  BP: 130/76 120/61  115/70  Pulse: 93 72  89  Resp:  16  16  Temp:  98.2 F (36.8 C)  98.7 F (37.1 C)  TempSrc:  Oral  Oral  SpO2:  98% 93% 95%  Weight:  78.4 kg    Height:        Intake/Output Summary (Last 24 hours) at 01/24/2018 1558 Last  data filed at 01/24/2018 0601 Gross per 24 hour  Intake 200 ml  Output 950 ml  Net -750 ml   Filed Weights   01/22/18 0204 01/23/18 0415 01/24/18 0556  Weight: 79.9 kg 78.7 kg 78.4 kg    Examination: General appearance: Pleasant elderly male, moderately built and thinly nourished  lying comfortably propped up in bed and does not appear in painful distress.. Cardiac: S1 and S2 heard, irregularly irregular.  No JVD, murmurs or pedal edema.  Telemetry personally reviewed and shows A. fib mostly in the 80s-100s. Respiratory: Clear to auscultation.  No increased work of breathing.  Stable. Abdomen: Abdomen is nondistended, soft and nontender.  No organomegaly or masses appreciated.  Normal bowel sounds heard.  Stable. MSK: No deformities or effusions, but he guards the left leg, also right leg movement is somewhat limited.  No new findings/stable.  Bruising of right upper extremity which his wife indicates has been present even prior to fall.  Stable. Neuro: Alert this morning and oriented x2.  No focal neurological deficits.  Improved and stable. Psych: Pleasant and appropriate.  Judgment and insight impaired.    Data Reviewed: I have personally reviewed following labs and imaging studies:  CBC: Recent Labs  Lab 01/21/18 1949 01/22/18 0213 01/23/18 0457 01/24/18 0425  WBC 8.4 8.2 9.0 8.6  NEUTROABS 6.5  --   --   --   HGB 12.2* 11.5* 11.0* 11.0*  HCT 38.6* 35.6* 33.9* 34.3*  MCV 94.1 92.5 93.4 94.5  PLT 151 127* 126* 628*   Basic Metabolic Panel: Recent Labs  Lab 01/21/18 1949 01/22/18 0635 01/23/18 0457 01/24/18 0425  NA 141 140 137 137  K 4.1 4.1 4.1 4.1  CL 102 104 102 101  CO2 32 26 26 27   GLUCOSE 107* 125* 110* 98  BUN 17 15 15 14   CREATININE 1.23 0.94 0.92 0.80  CALCIUM 9.0 8.5* 8.4* 8.4*   GFR: Estimated Creatinine Clearance: 69.6 mL/min (by C-G formula based on SCr of 0.8 mg/dL). Liver Function Tests: Recent Labs  Lab 01/21/18 1949  AST 24  ALT 14  ALKPHOS 39  BILITOT 1.0  PROT 6.6  ALBUMIN 3.7   Coagulation Profile: Recent Labs  Lab 01/21/18 1949 01/23/18 0457 01/24/18 0425  INR 1.52 1.30 1.26   Cardiac Enzymes: Recent Labs  Lab 01/22/18 0213  CKTOTAL 163   CBG: Recent Labs  Lab 01/23/18 1246  GLUCAP 91       Radiology Studies: No results found.      Scheduled Meds: . alfuzosin  10 mg Oral Q breakfast  . cholecalciferol  2,000 Units Oral Daily  . digoxin  125 mcg Oral Daily  . docusate sodium  100 mg Oral QPM  . finasteride  5 mg Oral QHS  . Melatonin  6 mg Oral QHS  . metoprolol tartrate  12.5 mg Oral BID  . multivitamin with minerals  1 tablet Oral Daily  . polyethylene glycol  17 g Oral BID  . senna  2 tablet Oral Daily  . warfarin  6 mg Oral ONCE-1800  . Warfarin - Pharmacist Dosing Inpatient   Does not apply q1800   Continuous Infusions:   LOS: 2 days    Time spent: 25 minutes   Vernell Leep, MD, FACP, [facility]. [facility] Pager [phone]  If 7PM-7AM, please contact night-coverage [email or web address] Password [facility] 01/24/2018, 3:58 PM

## 2018-01-24 NOTE — Progress Notes (Addendum)
3:59 pm [facility] has offered SNF bed and started [employer] authorization. [facility] cancelled auth for their facility. CSW to follow and support.  3:16 pm Patient's daughter requested to speak with CSW at bedside. CSW met with daughter, Eustaquio Maize. Family now requesting a different SNF - [facility]. CSW advised that [facility] authorization has already been started with [facility], and changing facilities would delay the authorization process. [facility] is also not in network with [employer]. Beth indicated patient would have out of network benefits at [facility]. Family insistent patient should go to [facility].   CSW sent referral to [facility] and spoke to [place] in admissions; they are reviewing referral and will follow up with bed offer. Levada Dy confirmed patient will have out of network benefits and a co-pay of approx. $240/day. If bed offer made, Holli Humbles with [facility] will be stopped and [facility] will begin new auth. CSW to follow.  Estanislado Emms, [place]

## 2018-01-24 NOTE — Progress Notes (Signed)
Physical Therapy Treatment Patient Details Name: Wayne [name] MRN: [id number] DOB: [birth year] Today's Date: 01/24/2018    History of Present Illness Pt is a 84 y/o male admitted after falling at home with mildly comminuted fractures of L superior and inferior pubic rami and soft tissue hemorrhage, small bilateral pleural effusions. PMH significant for but not limited to: HTN, stroke, TAVR, MV stenosis, A fib on coumadin, BPH, CAD, CHF.      PT Comments    Pt lethargic on arrival but able to converse with cueing. Pt with increased movement of LLE today with cues and assist to get to EOB. Pt again limited by orthostatic BP and pain with inability to transfer to a chair.  Supine BP 127/71, sitting BP 95/67 with HR 90 with inability to obtain standing pressure and maintained dizziness with return to sitting. Upon return to bed BP130/63, HR 100. Pt encouraged to continue bil LE movement, positioned in chair position in bed end of session. SpO2 93% on RA. Will continue to follow.    Follow Up Recommendations  SNF;Supervision/Assistance - 24 hour     Equipment Recommendations  None recommended by PT    Recommendations for Other Services       Precautions / Restrictions Precautions Precautions: Fall Restrictions LLE Weight Bearing: Weight bearing as tolerated    Mobility  Bed Mobility Overal bed mobility: Needs Assistance Bed Mobility: Supine to Sit;Sit to Supine Rolling: Max assist   Supine to sit: +2 for physical assistance;Max assist Sit to supine: Max assist;+2 for physical assistance   General bed mobility comments: pt rolled bil with max assist with greater difficulty rolling left than right for linen change and pericare. Max +2 to pivot to EOB with legs rotated together, HOB 30 degress with pt able to reach LUE across body to rail today. With return to bed pivot back into supine with assist at trunk and bil LE. Total assist to scoot to [facility]  Transfers Overall transfer  level: Needs assistance   Transfers: Sit to/from Stand Sit to Stand: Mod assist;+2 physical assistance;From elevated surface         General transfer comment: pt stood from bed with mod assist, cues to hand placement and pt maintaining elevated heel on LLE to decreased weight bearing, cues for knee extension and posture with pt tolerance limited to 30 sec by dizziness and pain. Unable to obtain standing BP. In return to sitting pt able to scoot toward HOB x 3 with mod +2 assist of pad  Ambulation/Gait             General Gait Details: unable   Stairs             Wheelchair Mobility    Modified Rankin (Stroke Patients Only)       Balance Overall balance assessment: Needs assistance Sitting-balance support: Bilateral upper extremity supported;Feet supported Sitting balance-Leahy Scale: Fair Sitting balance - Comments: min guard for safety, reported dizziness that improved with time     Standing balance-Leahy Scale: Poor Standing balance comment: bil UE assist in standing                            Cognition Arousal/Alertness: Awake/alert Behavior During Therapy: Flat affect Overall Cognitive Status: Impaired/Different from baseline Area of Impairment: Orientation;Memory;Following commands;Safety/judgement;Problem solving                 Orientation Level: Disoriented to;Time   Memory: Decreased short-term memory Following  Commands: Follows one step commands with increased time     Problem Solving: Slow processing        Exercises General Exercises - Lower Extremity Heel Slides: AROM;AAROM;5 reps;Left;Right;Supine(AAROM on LLE)    General Comments        Pertinent Vitals/Pain Faces Pain Scale: Hurts whole lot Pain Location: L LE, pelvis Pain Descriptors / Indicators: Discomfort;Grimacing;Guarding Pain Intervention(s): Limited activity within patient's tolerance;Repositioned;Monitored during session;Other (comment)(pt denied  vicodin and received tylenol at 0300)    Home Living                      Prior Function            PT Goals (current goals can now be found in the care plan section) Progress towards PT goals: Progressing toward goals    Frequency    Min 3X/week      PT Plan Current plan remains appropriate    Co-evaluation              AM-PAC PT "6 Clicks" Daily Activity  Outcome Measure  Difficulty turning over in bed (including adjusting bedclothes, sheets and blankets)?: Unable Difficulty moving from lying on back to sitting on the side of the bed? : Unable Difficulty sitting down on and standing up from a chair with arms (e.g., wheelchair, bedside commode, etc,.)?: Unable Help needed moving to and from a bed to chair (including a wheelchair)?: A Lot Help needed walking in hospital room?: Total Help needed climbing 3-5 steps with a railing? : Total 6 Click Score: 7    End of Session Equipment Utilized During Treatment: Gait belt Activity Tolerance: Patient limited by pain;Treatment limited secondary to medical complications (Comment) Patient left: in bed;with call bell/phone within reach;with family/visitor present;with bed alarm set Nurse Communication: Mobility status;Precautions PT Visit Diagnosis: Unsteadiness on feet (R26.81);Other abnormalities of gait and mobility (R26.89);Muscle weakness (generalized) (M62.81);Dizziness and giddiness (R42);Pain Pain - Right/Left: Left Pain - part of body: Hip     Time: 6333-5456 PT Time Calculation (min) (ACUTE ONLY): 41 min  Charges:  $Therapeutic Exercise: 8-22 mins $Therapeutic Activity: 23-37 mins                     [address] [place]    Wayne [name] 01/24/2018, 9:33 AM

## 2018-01-24 NOTE — Evaluation (Signed)
Clinical/Bedside Swallow Evaluation Patient Details  Name: Wayne [name] MRN: [id number] Date of Birth: 04/25/1930  Today's Date: 01/24/2018 Time: SLP Start Time (ACUTE ONLY): 1250 SLP Stop Time (ACUTE ONLY): 1305 SLP Time Calculation (min) (ACUTE ONLY): 15 min  Past Medical History:  Past Medical History:  Diagnosis Date  . Aortic stenosis   . Arthritis   . Atrial fibrillation (HCC)    Chronic  . Atrial fibrillation, chronic ([place])   . BPH (benign prostatic hyperplasia)   . CAD (coronary artery disease)   . Chronic anticoagulation   . History of chicken pox   . HTN (hypertension)   . Mitral stenosis   . Old MI (myocardial infarction) 2007   s/p PCI to distal OM (no stent)  . Severe mitral regurgitation   . Stroke, embolic [facility])    7322-0 weeks after his heart attack   Past Surgical History:  Past Surgical History:  Procedure Laterality Date  . CORONARY ANGIOPLASTY  2007   Distal OM  . PROSTATE BIOPSY    . RIGHT/LEFT HEART CATH AND CORONARY ANGIOGRAPHY N/A 08/01/2017   Procedure: RIGHT/LEFT HEART CATH AND CORONARY ANGIOGRAPHY;  Surgeon: Sherren Mocha, MD;  Location: [facility];  Service: Cardiovascular;  Laterality: N/A;  . TEE WITHOUT CARDIOVERSION N/A 07/20/2017   Procedure: TRANSESOPHAGEAL ECHOCARDIOGRAM (TEE);  Surgeon: Lelon Perla, MD;  Location: [facility] ENDOSCOPY;  Service: Cardiovascular;  Laterality: N/A;  . TEE WITHOUT CARDIOVERSION N/A 09/04/2017   Procedure: TRANSESOPHAGEAL ECHOCARDIOGRAM (TEE);  Surgeon: Sherren Mocha, MD;  Location: [facility];  Service: Open Heart Surgery;  Laterality: N/A;  . TRANSCATHETER AORTIC VALVE REPLACEMENT, TRANSFEMORAL N/A 09/04/2017   Procedure: TRANSCATHETER AORTIC VALVE REPLACEMENT, TRANSFEMORAL;  Surgeon: Sherren Mocha, MD;  Location: [place];  Service: Open Heart Surgery;  Laterality: N/A;   HPI:  Wayne [name] is a 84 y.o. M with Afib on warfarin, AS s/p AVR bioprosthetic, HTN, coronary disease and hx of stroke who  presented with fall and hip pain.  CT pelvis in ER shows inferior and superior pubic rami fractures as well as small hematomas.  Admitted for anticoagulation management in setting of hematomas.     Assessment / Plan / Recommendation Clinical Impression  Pt demonstrates normal swallow function. His daughter reports he has been observed to cough frequently while drinking while laying flat. She reports he cannot tolerate more than 28 degrees due to pain. SLP repositioned pt more upright, though still partially reclined with no s/s of aspiration. Pt grimaced and moaned a bit, but SLP reclined pt after intake.  Encouraged family and pt to attempt tolerating short periods upright for PO intake and consider meds whole in puree if he wants to take meds while reclined. Pt also reported expectorating lots of mucous at night PTA. We discussed potential role of silent reflux and strategies that may address this in addition to f/u with GI after d/c. No further SLP interventions needed. Will sign off.  SLP Visit Diagnosis: Dysphagia, oropharyngeal phase (R13.12)    Aspiration Risk  Mild aspiration risk    Diet Recommendation Regular;Thin liquid   Liquid Administration via: Cup;Straw Medication Administration: Whole meds with puree Supervision: Patient able to self feed Postural Changes: Seated upright at 90 degrees;Remain upright for at least 30 minutes after po intake    Other  Recommendations Oral Care Recommendations: Oral care BID   Follow up Recommendations Skilled Nursing facility      Frequency and Duration  Prognosis        Swallow Study   General HPI: Wayne [name] is a 84 y.o. M with Afib on warfarin, AS s/p AVR bioprosthetic, HTN, coronary disease and hx of stroke who presented with fall and hip pain.  CT pelvis in ER shows inferior and superior pubic rami fractures as well as small hematomas.  Admitted for anticoagulation management in setting of hematomas.   Type of Study:  Bedside Swallow Evaluation Previous Swallow Assessment: none Diet Prior to this Study: Regular;Thin liquids Temperature Spikes Noted: No Respiratory Status: Room air History of Recent Intubation: No Behavior/Cognition: Alert;Cooperative Oral Cavity Assessment: Dry Oral Care Completed by SLP: No Oral Cavity - Dentition: Adequate natural dentition Vision: Functional for self-feeding Self-Feeding Abilities: Total assist Patient Positioning: Partially reclined Baseline Vocal Quality: Normal Volitional Cough: Strong Volitional Swallow: Able to elicit    Oral/Motor/Sensory Function Overall Oral Motor/Sensory Function: Within functional limits   Ice Chips     Thin Liquid Thin Liquid: Within functional limits Presentation: Straw    Nectar Thick Nectar Thick Liquid: Not tested   Honey Thick Honey Thick Liquid: Not tested   Puree Puree: Within functional limits   Solid     Solid: Not tested      [name], Katherene Ponto 01/24/2018,1:25 PM

## 2018-01-24 NOTE — Progress Notes (Addendum)
Progress Note  Patient Name: Wayne [name] Date of Encounter: 01/24/2018  Primary Cardiologist: Dr. Stanford Breed  Subjective   Pt having significant pain from injuries. Family present   Inpatient Medications    Scheduled Meds: . alfuzosin  10 mg Oral Q breakfast  . cholecalciferol  2,000 Units Oral Daily  . digoxin  125 mcg Oral Daily  . docusate sodium  100 mg Oral QPM  . finasteride  5 mg Oral QHS  . Melatonin  6 mg Oral QHS  . metoprolol tartrate  12.5 mg Oral BID  . multivitamin with minerals  1 tablet Oral Daily  . polyethylene glycol  17 g Oral BID  . senna  2 tablet Oral Daily  . warfarin  6 mg Oral ONCE-1800  . Warfarin - Pharmacist Dosing Inpatient   Does not apply q1800   Continuous Infusions:  PRN Meds: acetaminophen, bisacodyl, dextromethorphan-guaiFENesin, hydrALAZINE, methocarbamol, MUSCLE RUB, ondansetron (ZOFRAN) IV, sodium chloride, sodium phosphate, triamcinolone cream   Vital Signs    Vitals:   01/23/18 1940 01/23/18 2220 01/24/18 0556 01/24/18 0926  BP: 134/68 130/76 120/61   Pulse: (!) 112 93 72   Resp: (!) 22  16   Temp: 98.2 F (36.8 C)  98.2 F (36.8 C)   TempSrc: Oral  Oral   SpO2: 96%  98% 93%  Weight:   78.4 kg   Height:        Intake/Output Summary (Last 24 hours) at 01/24/2018 1105 Last data filed at 01/24/2018 0601 Gross per 24 hour  Intake 200 ml  Output 950 ml  Net -750 ml   Filed Weights   01/22/18 0204 01/23/18 0415 01/24/18 0556  Weight: 79.9 kg 78.7 kg 78.4 kg    Physical Exam   General: Frail elderly, male in no acute distress Head: Eyes PERRLA, No xanthomas.   Normocephalic and atraumatic  Lungs: Decreased breath sounds bases. Heart: Irregular rate and rhythm with 3/6 murmur, pulses are 2+ & JVD 8-9 cm. Abdomen: Bowel sounds are present, abdomen soft and non-tender without masses or  hernias noted. Msk: Normal strength and tone for age. Extremities: No clubbing, cyanosis or edema.  TED hose in  place Skin:  No rashes or lesions noted. Neuro: Sleepy and less aroused, has had pain medications, answers appropriately, strength generally weak but equal Psych:  Good affect, responds appropriately  Labs    Chemistry Recent Labs  Lab 01/21/18 1949 01/22/18 0635 01/23/18 0457 01/24/18 0425  NA 141 140 137 137  K 4.1 4.1 4.1 4.1  CL 102 104 102 101  CO2 32 26 26 27   GLUCOSE 107* 125* 110* 98  BUN 17 15 15 14   CREATININE 1.23 0.94 0.92 0.80  CALCIUM 9.0 8.5* 8.4* 8.4*  PROT 6.6  --   --   --   ALBUMIN 3.7  --   --   --   AST 24  --   --   --   ALT 14  --   --   --   ALKPHOS 39  --   --   --   BILITOT 1.0  --   --   --   GFRNONAA 51* >60 >60 >60  GFRAA 59* >60 >60 >60  ANIONGAP 7 10 9 9      Hematology Recent Labs  Lab 01/22/18 0213 01/23/18 0457 01/24/18 0425  WBC 8.2 9.0 8.6  RBC 3.85* 3.63* 3.63*  HGB 11.5* 11.0* 11.0*  HCT 35.6* 33.9* 34.3*  MCV  92.5 93.4 94.5  MCH 29.9 30.3 30.3  MCHC 32.3 32.4 32.1  RDW 15.2 15.3 15.3  PLT 127* 126* 128*    BNP Recent Labs  Lab 01/22/18 0213  BNP 271.8*    Lab Results  Component Value Date   INR 1.26 01/24/2018   INR 1.30 01/23/2018   INR 1.52 01/21/2018     Radiology    No results found. Telemetry    01/24/2018, atrial fibrillation with heart rate generally less than 100- Personally Reviewed  ECG    No new tracing as of 01/23/18 - Personally Reviewed  Cardiac Studies   Echo: 10/24/17  Study Conclusions  - Left ventricle: The cavity size was normal. There was mild concentric hypertrophy. Systolic function was normal. The estimated ejection fraction was in the range of 55% to 60%. Wall motion was normal; there were no regional wall motion abnormalities. - Aortic valve: A TAVR Edwards Sapien 3 THV (size 26 mm, model # 9600TFX, serial # [id number] was present. There was trivial perivalvular regurgitation. - Mitral valve: Moderately calcified annulus. Mildly  thickened, moderately calcified leaflets . Moderate prolapse, involving the posterior leaflet, partially flail. The findings are consistent with moderate stenosis. There was severe regurgitation directed eccentrically and posteriorly. Mean gradient (D): 9 mm Hg. - Left atrium: The atrium was severely dilated. - Right atrium: The atrium was severely dilated. - Tricuspid valve: There was mild regurgitation. - Pulmonary arteries: Systolic pressure was mildly increased. PA peak pressure: 42 mm Hg (S).  Impressions:  - No significant change in mitral regurgitation.  Patient Profile     84 y.o. male PMH of AS s/p TAVR, MR (non felt to be a candidate for mitraclip) Afib on coumadin, CAD s/p PCI OM who presented after a mechanical fall at home. Now with hematoma and fracture of the left pubic ramus.   Assessment & Plan    1. S/p fall with left pubis ramus fx with orthostasis:  - Management per Ortho - He was able to get up with physical therapy but was limited by both orthostatic hypotension and pain -SBP 95 today with standing, patient symptomatic -he is on finasteride for BPH, that may be contributing to the orthostatic symptoms -Will order incentive spirometry  2. Permanent atrial fibrillation managed on Coumadin therapy:  -On metoprolol 12.5 mg twice daily and digoxin 125 mcg daily, dig level less than 0.2 this admission - Heart rate is generally less than 100. -Do not believe these medications are significantly contributing to his orthostatic hypotension - Coumadin is being restarted since H&H are stable  3. AS s/p TAVR:  - per Dr Burt Knack, f/u 10/2018   Signed, Rosaria Ferries, PA-C 01/24/2018 11:08 AM Beeper [phone]  Attending Note:   The patient was seen and examined.  Agree with assessment and plan as noted above.  Changes made to the above note as needed.  Patient seen and independently examined with Rosaria Ferries, PA .   We discussed all aspects of the  encounter. I agree with the assessment and plan as stated above.  1.  Atrial fibrillation: The patient has permanent atrial fibrillation.  He is on Coumadin.  Coumadin has now been restarted following his injury.  His hemoglobin has been stable.  2.    Aortic stenosis-status post TAVR: Stable  He is stable from a cardiac standpoint. Will sign off  [facility] will sign off.   Medication Recommendations:  Continue meds.   INR will need to be monitored while in SNF and  sent to our coumadin clinic  Other recommendations (labs, testing, etc):   Follow up as an outpatient:  Dr. Stanford Breed and Dr. Burt Knack     I have spent a total of 40 minutes with patient reviewing hospital  notes , telemetry, EKGs, labs and examining patient as well as establishing an assessment and plan that was discussed with the patient. > 50% of time was spent in direct patient care.    Thayer Headings, Brooke Bonito., MD, [facility] 01/24/2018, 4:10 PM [address],  [place] Pager [phone]

## 2018-01-24 NOTE — Progress Notes (Signed)
ANTICOAGULATION CONSULT NOTE - Initial Consult  Pharmacy Consult for Coumadin Indication: atrial fibrillation  Allergies  Allergen Reactions  . Penicillins Rash and Other (See Comments)    PATIENT HAS HAD A PCN REACTION WITH IMMEDIATE RASH, FACIAL/TONGUE/THROAT SWELLING, SOB, OR LIGHTHEADEDNESS WITH HYPOTENSION:  #  #  #  YES  #  #  #   Has patient had a PCN reaction causing severe rash involving mucus membranes or skin necrosis: No Has patient had a PCN reaction that required hospitalization No Has patient had a PCN reaction occurring within the last 10 years: No If all of the above answers are "NO", then may proceed with Cephalosporin use.  . Prednisone Other (See Comments)    Wheezing  . Procaine Hcl Other (See Comments)    Passed out after being given this at dental appointment  . Levofloxacin Rash  . Oxycodone Other (See Comments)    constipation    Patient Measurements: Height: 5' 10.5" (179.1 cm) Weight: 172 lb 13.5 oz (78.4 kg) IBW/kg (Calculated) : 74.15  Vital Signs: Temp: 98.2 F (36.8 C) (08/08 0556) Temp Source: Oral (08/08 0556) BP: 120/61 (08/08 0556) Pulse Rate: 72 (08/08 0556)  Labs: Recent Labs    01/21/18 1949 01/22/18 0213 01/22/18 2637 01/23/18 0457 01/24/18 0425  HGB 12.2* 11.5*  --  11.0* 11.0*  HCT 38.6* 35.6*  --  33.9* 34.3*  PLT 151 127*  --  126* 128*  LABPROT 18.2*  --   --  16.1* 15.7*  INR 1.52  --   --  1.30 1.26  CREATININE 1.23  --  0.94 0.92 0.80  CKTOTAL  --  163  --   --   --     Estimated Creatinine Clearance: 69.6 mL/min (by C-G formula based on SCr of 0.8 mg/dL).   Medical History: Past Medical History:  Diagnosis Date  . Aortic stenosis   . Arthritis   . Atrial fibrillation (HCC)    Chronic  . Atrial fibrillation, chronic ([place])   . BPH (benign prostatic hyperplasia)   . CAD (coronary artery disease)   . Chronic anticoagulation   . History of chicken pox   . HTN (hypertension)   . Mitral stenosis   . Old MI  (myocardial infarction) 2007   s/p PCI to distal OM (no stent)  . Severe mitral regurgitation   . 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   PMH: arthritis, hypertension, stroke, 2007, aortic stenosis (s/p of TAVR with biovalve), MV stenosis, A fib on Coumadin, BPH, CAD, dCHF, HTN, MS, MR,    Anticoag: Afib and CVA on Coumadin PTA. CHA2DS2-VASc elevated. No surgery planned. CT pelvis in ER shows inferior and superior pubic rami fractures as well as small hematomas. INR 1.26. Hgb with slight drop 12.2>11.5>11. - PTA Coumadin 2.5mg  TThS, 5mg  MWFSun, Admit INR 1.52 low  Goal of Therapy:  INR 2-3 Monitor platelets by anticoagulation protocol: Yes   Plan:  Coumadin 6mg  po x 1 tonight. Likely will need higher doses due to low admit INR Daily INR   Cassie Henkels S. Alford Highland, PharmD, BCPS Clinical Staff Pharmacist Pager [phone]  Eilene Ghazi Stillinger 01/24/2018,9:47 AM

## 2018-01-25 LAB — CBC
HEMATOCRIT: 34.9 % — AB (ref 39.0–52.0)
Hemoglobin: 11.2 g/dL — ABNORMAL LOW (ref 13.0–17.0)
MCH: 30 pg (ref 26.0–34.0)
MCHC: 32.1 g/dL (ref 30.0–36.0)
MCV: 93.6 fL (ref 78.0–100.0)
Platelets: 143 10*3/uL — ABNORMAL LOW (ref 150–400)
RBC: 3.73 MIL/uL — ABNORMAL LOW (ref 4.22–5.81)
RDW: 14.9 % (ref 11.5–15.5)
WBC: 8.5 10*3/uL (ref 4.0–10.5)

## 2018-01-25 LAB — PROTIME-INR
INR: 1.28
Prothrombin Time: 15.9 seconds — ABNORMAL HIGH (ref 11.4–15.2)

## 2018-01-25 MED ORDER — WARFARIN SODIUM 7.5 MG PO TABS
7.5000 mg | ORAL_TABLET | Freq: Once | ORAL | Status: AC
  Administered 2018-01-25: 7.5 mg via ORAL
  Filled 2018-01-25: qty 1

## 2018-01-25 NOTE — Care Management Important Message (Signed)
Important Message  Patient Details  Name: Christropher Gintz MRN: [id number] Date of Birth: [birth year]   Medicare Important Message Given:  Yes    Ragina Fenter P Markeisha Mancias 01/25/2018, 3:04 PM

## 2018-01-25 NOTE — Progress Notes (Signed)
[place] for Coumadin Indication: atrial fibrillation  Allergies  Allergen Reactions  . Penicillins Rash and Other (See Comments)    PATIENT HAS HAD A PCN REACTION WITH IMMEDIATE RASH, FACIAL/TONGUE/THROAT SWELLING, SOB, OR LIGHTHEADEDNESS WITH HYPOTENSION:  #  #  #  YES  #  #  #   Has patient had a PCN reaction causing severe rash involving mucus membranes or skin necrosis: No Has patient had a PCN reaction that required hospitalization No Has patient had a PCN reaction occurring within the last 10 years: No If all of the above answers are "NO", then may proceed with Cephalosporin use.  . Prednisone Other (See Comments)    Wheezing  . Procaine Hcl Other (See Comments)    Passed out after being given this at dental appointment  . Levofloxacin Rash  . Oxycodone Other (See Comments)    constipation    Labs: Recent Labs    01/23/18 0457 01/24/18 0425 01/25/18 0542  HGB 11.0* 11.0* 11.2*  HCT 33.9* 34.3* 34.9*  PLT 126* 128* 143*  LABPROT 16.1* 15.7* 15.9*  INR 1.30 1.26 1.28  CREATININE 0.92 0.80  --     Estimated Creatinine Clearance: 69.6 mL/min (by C-G formula based on SCr of 0.8 mg/dL).     Assessment: CC/HPI: Fall with fx of both sides of pelvis with some soft tissue hemorrhage on CT.   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 CHA2DS2-VASc elevated. No surgery planned. CT pelvis in ER shows inferior and superior pubic rami fractures as well as small hematomas. I- PTA Coumadin 2.5mg  TThS, 5mg  MWFSun,Admit INR 1.52  INR today = 1.28  Goal of Therapy:  INR 2-3 Monitor platelets by anticoagulation protocol: Yes   Plan:  Coumadin 7.5mg  po x 1 tonight Daily INR  Thank you Anette Guarneri, PharmD [phone]  01/25/2018,9:24 AM

## 2018-01-25 NOTE — Progress Notes (Signed)
Moved pt from chair to bed using steady  Pt tolerated well

## 2018-01-25 NOTE — Progress Notes (Signed)
Pt family assisted pt to edge of bed unsupervised. Pt family stated pt wants to get to the chair. Educated pt family on calling for assistance due to pt high fall risk. PT and mobility tech in room now

## 2018-01-25 NOTE — Progress Notes (Signed)
Physical Therapy Treatment Patient Details Name: Wayne [name] MRN: [id number] DOB: 08/19/1929 Today's Date: 01/25/2018    History of Present Illness Pt is a 84 y/o male admitted after falling at home with mildly comminuted fractures of L superior and inferior pubic rami and soft tissue hemorrhage, small bilateral pleural effusions. PMH significant for but not limited to: HTN, stroke, TAVR, MV stenosis, A fib on coumadin, BPH, CAD, CHF.      PT Comments    Patient demonstrating progression this visit, denies dizziness with standing. Able to successfully transfer to bedside chair today, mod A x2 for stability, cueing for proper use of RW and sequencing in light of high pain. Instructed nursing to attempt [facility] for return to bed. Family and patient pleased that patient was finally able to get to chair as they were determined today, pt very appreciative. Agree with SNF recs.     Follow Up Recommendations  SNF;Supervision/Assistance - 24 hour     Equipment Recommendations  None recommended by PT    Recommendations for Other Services       Precautions / Restrictions Precautions Precautions: Fall Restrictions LLE Weight Bearing: Weight bearing as tolerated    Mobility  Bed Mobility               General bed mobility comments: sitting EOB at my arrival. family had gait belt waiting and eager to attempt transfer to chair today. pt denies dizzines  Transfers Overall transfer level: Needs assistance Equipment used: Rolling walker (2 wheeled) Transfers: Sit to/from [facility] Sit to Stand: Mod assist;+2 physical assistance;From elevated surface Stand pivot transfers: Mod assist;+2 physical assistance       General transfer comment: Mod Ax2 to power up, provide stability throughout transfer. mod cueing needed for weight shifting, reliance on arms and sequencing to off load painful L sided stance phase in side steping and pivoting.   Ambulation/Gait                  Stairs             Wheelchair Mobility    Modified Rankin (Stroke Patients Only)       Balance Overall balance assessment: Needs assistance Sitting-balance support: Bilateral upper extremity supported;Feet supported Sitting balance-Leahy Scale: Fair       Standing balance-Leahy Scale: Poor Standing balance comment: bil UE assist in standing                            Cognition Arousal/Alertness: Awake/alert Behavior During Therapy: WFL for tasks assessed/performed                                   General Comments: pt no longer lethargic      Exercises      General Comments        Pertinent Vitals/Pain Pain Assessment: Faces Faces Pain Scale: Hurts whole lot Pain Location: L LE, pelvis Pain Descriptors / Indicators: Discomfort;Grimacing;Guarding Pain Intervention(s): Limited activity within patient's tolerance;Monitored during session    Home Living                      Prior Function            PT Goals (current goals can now be found in the care plan section) Acute Rehab PT Goals Patient Stated Goal: to have less pain Progress towards  PT goals: Progressing toward goals    Frequency    Min 3X/week      PT Plan Current plan remains appropriate    Co-evaluation              AM-PAC PT "6 Clicks" Daily Activity  Outcome Measure  Difficulty turning over in bed (including adjusting bedclothes, sheets and blankets)?: Unable Difficulty moving from lying on back to sitting on the side of the bed? : Unable Difficulty sitting down on and standing up from a chair with arms (e.g., wheelchair, bedside commode, etc,.)?: Unable Help needed moving to and from a bed to chair (including a wheelchair)?: A Lot Help needed walking in hospital room?: Total Help needed climbing 3-5 steps with a railing? : Total 6 Click Score: 7    End of Session Equipment Utilized During Treatment: Gait  belt Activity Tolerance: Patient limited by pain;Treatment limited secondary to medical complications (Comment) Patient left: in bed;with call bell/phone within reach;with family/visitor present;with bed alarm set Nurse Communication: Mobility status;Precautions PT Visit Diagnosis: Unsteadiness on feet (R26.81);Other abnormalities of gait and mobility (R26.89);Muscle weakness (generalized) (M62.81);Dizziness and giddiness (R42);Pain Pain - Right/Left: Left Pain - part of body: Hip     Time: 1157-2620 PT Time Calculation (min) (ACUTE ONLY): 18 min  Charges:  $Therapeutic Activity: 8-22 mins                     Reinaldo Berber, PT, DPT Acute Rehab Services Pager: [phone]     Reinaldo Berber 01/25/2018, 4:18 PM

## 2018-01-25 NOTE — Progress Notes (Signed)
PROGRESS NOTE    Wayne [name]  RUE:[id number] DOB: 08/26/1929 DOA: 01/21/2018 PCP: [name], DO      Brief Narrative:  Mr. Mitrano is a 84 y.o. M with Afib on warfarin, AS s/p AVR bioprosthetic, HTN, coronary disease and hx of stroke who presented with fall and hip pain.  CT pelvis in ER shows inferior and superior pubic rami fractures as well as small hematomas.  Admitted for anticoagulation management in setting of hematomas.  Orthopedics and Cardiology consulted.  Slowly improving.  Eventual discharge to SNF for rehab.   Assessment & Plan:  Pelvic hematoma Acute blood loss anemia Hemoglobin has stabilized.  Orthostatic changes better.  Thrombocytopenia Possibly due to acute blood loss.  Platelet counts better today in the 140s.  Periodically follow CBCs.  Left minimally displaced superior and inferior pubic ramus fractures -Orthopedic consultation and follow-up appreciated.  I discussed with orthopedic/Brad Dixon, PA-C on 8/7 who indicatet that patient's hematoma is small and should resolve on its own and okay to start Coumadin anticoagulation.  Weightbearing as tolerated and outpatient follow-up with orthopedics in 4 weeks.  I also discussed with radiologist on 8/7 who reviewed images from 8/5 and indicated that patient had small amount of hemorrhage. Opioids were discontinued based on family's request.  Intermittent pain but seems to be controlled on Tylenol.  Orthostatic hypotension -May be complicated by opioids which were discontinued.  Encouraged oral fluid intake.  Has TED hoses on.  Orthostatic blood pressure checks better today.  Atrial fibrillation, chronic CHA2DS2-Vasc 5 (age, HTN, stroke).  On Warfarin, INR subtherapeutic, warfarin held at admission.  Given history of stroke, and relative stability of pelvic hematomas, warfarin resumed 8/7.  Controlled ventricular rate. -Cardiology signed off 8/8.  Outpatient follow-up with Dr. Stanford Breed and Dr. Burt Knack  (TAVR).  and Coumadin clinic for INR checks. -Continue metoprolol, digoxin  History of TAVR Chronic mitral regurgitation  Coronary disease -Continue BB  Other medications -Continue alfuzosin -Continue finasteride.  These medications may be contributing some to orthostatic hypotension but stopping them may make his urinary issues worse.   Mental status changes Possibly due to opioids, discontinued 8/8.  DC Ambien.  No CO2 narcosis.  No focal neurological deficits.  Monitor closely.  CT head and neck 8/5 without acute findings.  Mental status changes significantly improved compared to 8/7 but may have periodic confusion and anxiety which may not be entirely unexpected given advanced age, possible underlying cognitive impairment and hospital delirium.  Delirium precautions.  Constipation Complicated by immobility and poor oral intake.  Had large BM after Dulcolax suppository on 8/8.  Cough Reportedly had cough on drinking liquids last night.  Speech therapy evaluated and recommended regular diet and thin liquids.    DVT prophylaxis: N/A on warfarin Code Status: DO NOT RESUSCITATE Family Communication: Discussed in detail with patient's spouse at bedside.  Updated care. MDM and disposition Plan: DC to SNF pending insurance clearance.  Discussed with clinical social work.    Consultants:   Cardiology  Orthopedics  Procedures:   None  Antimicrobials:   None    Subjective: Apparently had a rough night due to anxiety and some pain.  Pain improved after Tylenol this morning and patient ate breakfast by himself.  As per spouse, patient mental status much better this morning.  Ongoing aches and pains at multiple places.  No dizziness reported.  Objective: Vitals:   01/25/18 0851 01/25/18 1000 01/25/18 1210 01/25/18 1533  BP: 114/62  113/64 106/64  Pulse: 86  88 88  Resp:  20 20   Temp:  98.1 F (36.7 C) 98.2 F (36.8 C)   TempSrc:  Oral Oral   SpO2:   98%   Weight:       Height:        Intake/Output Summary (Last 24 hours) at 01/25/2018 1631 Last data filed at 01/25/2018 1534 Gross per 24 hour  Intake 340 ml  Output 1826 ml  Net -1486 ml   Filed Weights   01/23/18 0415 01/24/18 0556 01/25/18 0504  Weight: 78.7 kg 78.4 kg 77.9 kg    Examination: General appearance: Pleasant elderly male, moderately built and thinly nourished lying comfortably propped up in bed and does not appear in painful distress.. Stable. Cardiac: S1 and S2 heard, irregularly irregular.  No JVD, murmurs or pedal edema.  Telemetry personally reviewed and shows A. fib mostly in the 90s. Respiratory: Clear to auscultation.  No increased work of breathing.  Stable. Abdomen: Abdomen is nondistended, soft and nontender.  No organomegaly or masses appreciated.  Normal bowel sounds heard.  Stable. MSK: No deformities or effusions, but he guards the left leg, also right leg movement is somewhat limited.  No new findings/stable.  Bruising of right upper extremity which his wife indicates has been present even prior to fall.  Patient has bilateral TED hose. Neuro: Alert this morning and oriented x2.  No focal neurological deficits.  Improved and stable. Psych: Pleasant and appropriate.  Judgment and insight impaired.    Data Reviewed: I have personally reviewed following labs and imaging studies:  CBC: Recent Labs  Lab 01/21/18 1949 01/22/18 0213 01/23/18 0457 01/24/18 0425 01/25/18 0542  WBC 8.4 8.2 9.0 8.6 8.5  NEUTROABS 6.5  --   --   --   --   HGB 12.2* 11.5* 11.0* 11.0* 11.2*  HCT 38.6* 35.6* 33.9* 34.3* 34.9*  MCV 94.1 92.5 93.4 94.5 93.6  PLT 151 127* 126* 128* 709*   Basic Metabolic Panel: Recent Labs  Lab 01/21/18 1949 01/22/18 0635 01/23/18 0457 01/24/18 0425  NA 141 140 137 137  K 4.1 4.1 4.1 4.1  CL 102 104 102 101  CO2 32 26 26 27   GLUCOSE 107* 125* 110* 98  BUN 17 15 15 14   CREATININE 1.23 0.94 0.92 0.80  CALCIUM 9.0 8.5* 8.4* 8.4*   GFR: Estimated  Creatinine Clearance: 69.6 mL/min (by C-G formula based on SCr of 0.8 mg/dL). Liver Function Tests: Recent Labs  Lab 01/21/18 1949  AST 24  ALT 14  ALKPHOS 39  BILITOT 1.0  PROT 6.6  ALBUMIN 3.7   Coagulation Profile: Recent Labs  Lab 01/21/18 1949 01/23/18 0457 01/24/18 0425 01/25/18 0542  INR 1.52 1.30 1.26 1.28   Cardiac Enzymes: Recent Labs  Lab 01/22/18 0213  CKTOTAL 163   CBG: Recent Labs  Lab 01/23/18 1246  GLUCAP 91      Radiology Studies: No results found.      Scheduled Meds: . alfuzosin  10 mg Oral Q breakfast  . cholecalciferol  2,000 Units Oral Daily  . digoxin  125 mcg Oral Daily  . docusate sodium  100 mg Oral QPM  . finasteride  5 mg Oral QHS  . Melatonin  6 mg Oral QHS  . metoprolol tartrate  12.5 mg Oral BID  . multivitamin with minerals  1 tablet Oral Daily  . polyethylene glycol  17 g Oral BID  . senna  2 tablet Oral Daily  . warfarin  7.5 mg Oral  ONCE-1800  . Warfarin - Pharmacist Dosing Inpatient   Does not apply q1800   Continuous Infusions:   LOS: 3 days    Time spent: 25 minutes   Vernell Leep, MD, FACP, [facility] Pager [phone]  If 7PM-7AM, please contact night-coverage [email or web address] Password [email or web address] 01/25/2018, 4:31 PM

## 2018-01-25 NOTE — Progress Notes (Signed)
Pt unable to tolerate sitting upright and standing for orthostatic vitals  Completed orthostatic vitals- first position, pt laying flat and second position, pt bed in chair position (head of bed 45 degrees)

## 2018-01-25 NOTE — Progress Notes (Signed)
Mobility tech and RN assisted pt to side of bed  Washed pt back and applied lotion Assisted pt back into bed  Pt sitting at 26 degrees Placed pillows under heels  Pt wearing ted hose    Max assist

## 2018-01-26 LAB — PROTIME-INR
INR: 1.53
Prothrombin Time: 18.3 seconds — ABNORMAL HIGH (ref 11.4–15.2)

## 2018-01-26 LAB — CBC
HCT: 33.4 % — ABNORMAL LOW (ref 39.0–52.0)
Hemoglobin: 10.9 g/dL — ABNORMAL LOW (ref 13.0–17.0)
MCH: 30.1 pg (ref 26.0–34.0)
MCHC: 32.6 g/dL (ref 30.0–36.0)
MCV: 92.3 fL (ref 78.0–100.0)
Platelets: 157 10*3/uL (ref 150–400)
RBC: 3.62 MIL/uL — ABNORMAL LOW (ref 4.22–5.81)
RDW: 14.9 % (ref 11.5–15.5)
WBC: 7.5 10*3/uL (ref 4.0–10.5)

## 2018-01-26 MED ORDER — ACETAMINOPHEN 500 MG PO TABS
1000.0000 mg | ORAL_TABLET | Freq: Three times a day (TID) | ORAL | Status: DC
  Administered 2018-01-26 – 2018-01-28 (×7): 1000 mg via ORAL
  Filled 2018-01-26 (×7): qty 2

## 2018-01-26 MED ORDER — WARFARIN SODIUM 3 MG PO TABS
6.0000 mg | ORAL_TABLET | Freq: Once | ORAL | Status: AC
  Administered 2018-01-26: 6 mg via ORAL
  Filled 2018-01-26: qty 2

## 2018-01-26 NOTE — Progress Notes (Addendum)
[place] for Coumadin Indication: atrial fibrillation  Allergies  Allergen Reactions  . Penicillins Rash and Other (See Comments)    PATIENT HAS HAD A PCN REACTION WITH IMMEDIATE RASH, FACIAL/TONGUE/THROAT SWELLING, SOB, OR LIGHTHEADEDNESS WITH HYPOTENSION:  #  #  #  YES  #  #  #   Has patient had a PCN reaction causing severe rash involving mucus membranes or skin necrosis: No Has patient had a PCN reaction that required hospitalization No Has patient had a PCN reaction occurring within the last 10 years: No If all of the above answers are "NO", then may proceed with Cephalosporin use.  . Prednisone Other (See Comments)    Wheezing  . Procaine Hcl Other (See Comments)    Passed out after being given this at dental appointment  . Levofloxacin Rash  . Oxycodone Other (See Comments)    constipation    Labs: Recent Labs    01/24/18 0425 01/25/18 0542 01/26/18 0421  HGB 11.0* 11.2* 10.9*  HCT 34.3* 34.9* 33.4*  PLT 128* 143* 157  LABPROT 15.7* 15.9* 18.3*  INR 1.26 1.28 1.53  CREATININE 0.80  --   --     Estimated Creatinine Clearance: 69.6 mL/min (by C-G formula based on SCr of 0.8 mg/dL).  Assessment: CC/HPI: Fall with fx of both sides of pelvis with some soft tissue hemorrhage on CT.   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 CHA2DS2-VASc elevated. No surgery planned. CT pelvis in ER shows inferior and superior pubic rami fractures as well as small hematomas. Warfarin was held on admission and resumed 8/7. PTA Coumadin 2.5mg  TThS, 5mg  MWFSun, Admit INR 1.52  INR subtherapeutic today at 1.53. H/H stable, plts nml, no reports of bleeding currently.   Goal of Therapy:  INR 2-3 Monitor platelets by anticoagulation protocol: Yes   Plan:  Coumadin 6mg  po x 1 tonight  Daily INR Monitor  s/sx of bleeding   Wayne [name], PharmD PGY1 Pharmacy Resident Phone [phone] 01/26/2018 9:45 AM    I discussed / reviewed the pharmacy note by Dr. Wilkie Aye and I agree with the resident's findings and plans as documented. Wayne [name], PharmD, [place] Clinical Staff Pharmacist Pager [phone]

## 2018-01-26 NOTE — Progress Notes (Signed)
Clinical Social Worker following patient for support and discharge needs. CSW contacted facility to see if they were able to attain authorization through insurance. Facility stated at this time insurance auth has not yet been attained.   Rhea Pink, MSW,  [place]

## 2018-01-26 NOTE — Progress Notes (Addendum)
Pt's daughter, Wayne [name], is expressing concern about pt's discharge being held up by insurance issues. Her phone number is [phone] and her email address is [email or web address]. She requests a copy of all communication records with any insurance companies and/or skilled nursing facilities. This RN gave phone number for the Social Worker to pt's daughter. Pt's primary RN made aware. Daughter to follow up in the AM.

## 2018-01-26 NOTE — Progress Notes (Signed)
PROGRESS NOTE    Wayne [name]  TFT:[id number] DOB: [birth year] DOA: 01/21/2018 PCP: Wayne Dance, DO      Brief Narrative:  [name] is a 84 y.o. M with Afib on warfarin, AS s/p AVR bioprosthetic, HTN, coronary disease and hx of stroke who presented with fall and hip pain.  CT pelvis in ER shows inferior and superior pubic rami fractures as well as small hematomas.  Admitted for anticoagulation management in setting of hematomas.  Orthopedics and Cardiology consulted.  Slowly improving.  DC to SNF pending insurance approval.  Clinical social work following.   Assessment & Plan:  Pelvic hematoma Acute blood loss anemia Hemoglobin has stabilized.  Orthostatic changes better.  Thrombocytopenia Possibly due to acute blood loss.  Resolved today.  Periodically follow CBCs.  Left minimally displaced superior and inferior pubic ramus fractures -Orthopedic consultation and follow-up appreciated.  I discussed with orthopedic/Wayne Dixon, PA-C on 8/7 who indicatet that patient's hematoma is small and should resolve on its own and okay to start Coumadin anticoagulation.  Weightbearing as tolerated and outpatient follow-up with orthopedics in 4 weeks.  I also discussed with radiologist on 8/7 who reviewed images from 8/5 and indicated that patient had small amount of hemorrhage. Opioids were discontinued based on family's request.  Ongoing intermittent pain.  Discussed with spouse and changed Tylenol to 1 g 3 times daily scheduled for a couple days then can change to as needed.  Orthostatic hypotension -May be complicated by opioids which were discontinued.  Encouraged oral fluid intake.  Has TED hoses on.  Improved.  Atrial fibrillation, chronic CHA2DS2-Vasc 5 (age, HTN, stroke).  On Warfarin, INR subtherapeutic, warfarin held at admission.  Given history of stroke, and relative stability of pelvic hematomas, warfarin resumed 8/7 and INR starting to rise.  Controlled ventricular  rate. -Cardiology signed off 8/8.  Outpatient follow-up with Dr. Stanford [name] and Dr. Burt [name] (TAVR).  and Coumadin clinic for INR checks. -Continue metoprolol, digoxin.  Stable.  History of TAVR Chronic mitral regurgitation  Coronary disease -Continue BB  Other medications -Continue alfuzosin -Continue finasteride.  These medications may be contributing some to orthostatic hypotension but stopping them may make his urinary issues worse.   Mental status changes Possibly due to opioids, discontinued 8/8.  DC Ambien.  No CO2 narcosis.  No focal neurological deficits.  Monitor closely.  CT head and neck 8/5 without acute findings.  Mental status changes significantly improved compared to 8/7 but may have periodic confusion and anxiety which may not be entirely unexpected given advanced age, possible underlying cognitive impairment and hospital delirium.  Delirium precautions.  Stable.  Constipation Complicated by immobility and poor oral intake.  Had large BM after Dulcolax suppository on 8/8.  Continue bowel regimen.  Cough Reportedly had cough on drinking liquids last night.  Speech therapy evaluated and recommended regular diet and thin liquids.  No cough reported.    DVT prophylaxis: N/A on warfarin Code Status: DO NOT RESUSCITATE Family Communication: Discussed in detail with patient's spouse at bedside.  Updated care as have been doing daily MDM and disposition Plan: DC to SNF pending insurance approval.    Consultants:   Cardiology  Orthopedics  Procedures:   None  Antimicrobials:   None    Subjective: Reportedly had more pelvic pain this morning, given Tylenol and slowly improving.  Did not like breakfast much this morning.  As per spouse, sat on recliner for an hour yesterday and did well.  Objective: Vitals:   01/25/18 1533  01/25/18 2027 01/26/18 0332 01/26/18 1133  BP: 106/64 119/73 132/68 125/67  Pulse: 88 96 99 86  Resp:  16 20 20   Temp:  98.4 F (36.9 C)  (!) 97.4 F (36.3 C) 98.5 F (36.9 C)  TempSrc:  Oral Oral Oral  SpO2:  97% 98% 96%  Weight:   77.9 kg   Height:        Intake/Output Summary (Last 24 hours) at 01/26/2018 1234 Last data filed at 01/26/2018 1136 Gross per 24 hour  Intake 480 ml  Output 1650 ml  Net -1170 ml   Filed Weights   01/24/18 0556 01/25/18 0504 01/26/18 0332  Weight: 78.4 kg 77.9 kg 77.9 kg    Examination: No significant change in exam compared to yesterday. General appearance: Pleasant elderly male, moderately built and thinly nourished lying comfortably propped up in bed and does not appear in painful distress.. Stable. Cardiac: S1 and S2 heard, irregularly irregular.  No JVD, murmurs or pedal edema.  Telemetry personally reviewed: Controlled ventricular rate Respiratory: Clear to auscultation.  No increased work of breathing.  Stable. Abdomen: Abdomen is nondistended, soft and nontender.  No organomegaly or masses appreciated.  Normal bowel sounds heard.  Stable. MSK: No deformities or effusions, but he guards the left leg, also right leg movement is somewhat limited.  No new findings/stable.  Bruising of right upper extremity which his wife indicates has been present even prior to fall.  Patient has bilateral TED hose. Neuro: Alert this morning and oriented x2.  No focal neurological deficits.  Improved and stable. Psych: Pleasant and appropriate.  Judgment and insight impaired.    Data Reviewed: I have personally reviewed following labs and imaging studies:  CBC: Recent Labs  Lab 01/21/18 1949 01/22/18 0213 01/23/18 0457 01/24/18 0425 01/25/18 0542 01/26/18 0421  WBC 8.4 8.2 9.0 8.6 8.5 7.5  NEUTROABS 6.5  --   --   --   --   --   HGB 12.2* 11.5* 11.0* 11.0* 11.2* 10.9*  HCT 38.6* 35.6* 33.9* 34.3* 34.9* 33.4*  MCV 94.1 92.5 93.4 94.5 93.6 92.3  PLT 151 127* 126* 128* 143* 161   Basic Metabolic Panel: Recent Labs  Lab 01/21/18 1949 01/22/18 0635 01/23/18 0457 01/24/18 0425  NA 141  140 137 137  K 4.1 4.1 4.1 4.1  CL 102 104 102 101  CO2 32 26 26 27   GLUCOSE 107* 125* 110* 98  BUN 17 15 15 14   CREATININE 1.23 0.94 0.92 0.80  CALCIUM 9.0 8.5* 8.4* 8.4*   GFR: Estimated Creatinine Clearance: 69.6 mL/min (by C-G formula based on SCr of 0.8 mg/dL). Liver Function Tests: Recent Labs  Lab 01/21/18 1949  AST 24  ALT 14  ALKPHOS 39  BILITOT 1.0  PROT 6.6  ALBUMIN 3.7   Coagulation Profile: Recent Labs  Lab 01/21/18 1949 01/23/18 0457 01/24/18 0425 01/25/18 0542 01/26/18 0421  INR 1.52 1.30 1.26 1.28 1.53   Cardiac Enzymes: Recent Labs  Lab 01/22/18 0213  CKTOTAL 163   CBG: Recent Labs  Lab 01/23/18 1246  GLUCAP 91      Radiology Studies: No results found.      Scheduled Meds: . alfuzosin  10 mg Oral Q breakfast  . cholecalciferol  2,000 Units Oral Daily  . digoxin  125 mcg Oral Daily  . docusate sodium  100 mg Oral QPM  . finasteride  5 mg Oral QHS  . Melatonin  6 mg Oral QHS  . metoprolol tartrate  12.5  mg Oral BID  . multivitamin with minerals  1 tablet Oral Daily  . polyethylene glycol  17 g Oral BID  . senna  2 tablet Oral Daily  . warfarin  6 mg Oral ONCE-1800  . Warfarin - Pharmacist Dosing Inpatient   Does not apply q1800   Continuous Infusions:   LOS: 4 days    Time spent: 25 minutes   Vernell Leep, MD, FACP, [facility] Pager [phone]  If 7PM-7AM, please contact night-coverage [email or web address] Password [email or web address] 01/26/2018, 12:34 PM

## 2018-01-26 NOTE — Plan of Care (Signed)
  Problem: Clinical Measurements: Goal: Will remain free from infection Outcome: Progressing   Problem: Activity: Goal: Risk for activity intolerance will decrease Outcome: Progressing   Problem: Safety: Goal: Ability to remain free from injury will improve Outcome: Progressing   

## 2018-01-27 ENCOUNTER — Inpatient Hospital Stay (HOSPITAL_COMMUNITY): Payer: Medicare HMO

## 2018-01-27 LAB — CBC
HEMATOCRIT: 35.8 % — AB (ref 39.0–52.0)
HEMOGLOBIN: 11.7 g/dL — AB (ref 13.0–17.0)
MCH: 30.1 pg (ref 26.0–34.0)
MCHC: 32.7 g/dL (ref 30.0–36.0)
MCV: 92 fL (ref 78.0–100.0)
Platelets: 163 10*3/uL (ref 150–400)
RBC: 3.89 MIL/uL — AB (ref 4.22–5.81)
RDW: 14.7 % (ref 11.5–15.5)
WBC: 7.9 10*3/uL (ref 4.0–10.5)

## 2018-01-27 LAB — PROTIME-INR
INR: 1.78
Prothrombin Time: 20.5 seconds — ABNORMAL HIGH (ref 11.4–15.2)

## 2018-01-27 MED ORDER — TRAMADOL HCL 50 MG PO TABS
25.0000 mg | ORAL_TABLET | Freq: Two times a day (BID) | ORAL | Status: DC | PRN
  Administered 2018-01-27: 25 mg via ORAL
  Filled 2018-01-27: qty 1

## 2018-01-27 MED ORDER — DICLOFENAC SODIUM 1 % TD GEL
4.0000 g | Freq: Four times a day (QID) | TRANSDERMAL | Status: DC
  Administered 2018-01-27 – 2018-01-28 (×4): 4 g via TOPICAL
  Filled 2018-01-27: qty 100

## 2018-01-27 MED ORDER — TRAMADOL HCL 50 MG PO TABS
50.0000 mg | ORAL_TABLET | Freq: Four times a day (QID) | ORAL | Status: DC | PRN
  Administered 2018-01-27 – 2018-01-28 (×3): 50 mg via ORAL
  Filled 2018-01-27 (×3): qty 1

## 2018-01-27 MED ORDER — WARFARIN SODIUM 3 MG PO TABS
6.0000 mg | ORAL_TABLET | Freq: Once | ORAL | Status: AC
  Administered 2018-01-27: 6 mg via ORAL
  Filled 2018-01-27: qty 2

## 2018-01-27 NOTE — Progress Notes (Signed)
Physical Therapy Treatment Patient Details Name: Wayne [name] MRN: [id number] DOB: May 16, 1930 Today's Date: 01/27/2018    History of Present Illness Pt is a 84 y/o male admitted after falling at home with mildly comminuted fractures of L superior and inferior pubic rami and soft tissue hemorrhage, small bilateral pleural effusions. PMH significant for but not limited to: HTN, stroke, TAVR, MV stenosis, A fib on coumadin, BPH, CAD, CHF.      PT Comments    Unable to progress patients mobility this visit due to high levels of pain. Pt with short term memory not recalling our prior session of transfer to chair. Pt declining any OOB mobility this session, focused on bed level therex. Pt will need SNF placement for continued therapy once medically ready for d/c.      Follow Up Recommendations  SNF;Supervision/Assistance - 24 hour     Equipment Recommendations  None recommended by PT    Recommendations for Other Services       Precautions / Restrictions Precautions Precautions: Fall Restrictions Weight Bearing Restrictions: Yes LLE Weight Bearing: Weight bearing as tolerated    Mobility  Bed Mobility Overal bed mobility: Needs Assistance Bed Mobility: Supine to Sit;Sit to Supine Rolling: Max assist     Sit to supine: Max assist;+2 for physical assistance   General bed mobility comments: pt unable to perform sitting EOB today due to high levels of pain   Transfers                    Ambulation/Gait                 Stairs             Wheelchair Mobility    Modified Rankin (Stroke Patients Only)       Balance                                            Cognition Arousal/Alertness: Awake/alert Behavior During Therapy: WFL for tasks assessed/performed Overall Cognitive Status: Impaired/Different from baseline Area of Impairment: Orientation;Memory;Following commands;Safety/judgement;Problem solving                  Orientation Level: Disoriented to;Time   Memory: Decreased short-term memory Following Commands: Follows one step commands with increased time     Problem Solving: Slow processing General Comments: pt no longer lethargic      Exercises General Exercises - Lower Extremity Ankle Circles/Pumps: 20 reps Quad Sets: 10 reps Heel Slides: 5 reps Hip ABduction/ADduction: 10 reps    General Comments        Pertinent Vitals/Pain Pain Assessment: Faces Faces Pain Scale: Hurts whole lot Pain Location: L LE, pelvis Pain Descriptors / Indicators: Discomfort;Grimacing;Guarding Pain Intervention(s): Limited activity within patient's tolerance;Monitored during session    Home Living                      Prior Function            PT Goals (current goals can now be found in the care plan section) Acute Rehab PT Goals Patient Stated Goal: to have less pain Progress towards PT goals: Not progressing toward goals - comment(pain limiting session)    Frequency    Min 3X/week      PT Plan Current plan remains appropriate    Co-evaluation  AM-PAC PT "6 Clicks" Daily Activity  Outcome Measure  Difficulty turning over in bed (including adjusting bedclothes, sheets and blankets)?: Unable Difficulty moving from lying on back to sitting on the side of the bed? : Unable Difficulty sitting down on and standing up from a chair with arms (e.g., wheelchair, bedside commode, etc,.)?: Unable Help needed moving to and from a bed to chair (including a wheelchair)?: A Lot Help needed walking in hospital room?: Total Help needed climbing 3-5 steps with a railing? : Total 6 Click Score: 7    End of Session Equipment Utilized During Treatment: Gait belt Activity Tolerance: Patient limited by pain;Treatment limited secondary to medical complications (Comment) Patient left: in bed;with call bell/phone within reach;with family/visitor present;with bed alarm  set Nurse Communication: Mobility status;Precautions PT Visit Diagnosis: Unsteadiness on feet (R26.81);Other abnormalities of gait and mobility (R26.89);Muscle weakness (generalized) (M62.81);Dizziness and giddiness (R42);Pain Pain - Right/Left: Left Pain - part of body: Hip     Time: 1040-1055 PT Time Calculation (min) (ACUTE ONLY): 15 min  Charges:  $Therapeutic Exercise: 8-22 mins                     Reinaldo Berber, PT, DPT Acute Rehab Services Pager: [phone]     Reinaldo Berber 01/27/2018, 10:52 AM

## 2018-01-27 NOTE — Progress Notes (Addendum)
[place] for Coumadin Indication: atrial fibrillation  Allergies  Allergen Reactions  . Penicillins Rash and Other (See Comments)    PATIENT HAS HAD A PCN REACTION WITH IMMEDIATE RASH, FACIAL/TONGUE/THROAT SWELLING, SOB, OR LIGHTHEADEDNESS WITH HYPOTENSION:  #  #  #  YES  #  #  #   Has patient had a PCN reaction causing severe rash involving mucus membranes or skin necrosis: No Has patient had a PCN reaction that required hospitalization No Has patient had a PCN reaction occurring within the last 10 years: No If all of the above answers are "NO", then may proceed with Cephalosporin use.  . Prednisone Other (See Comments)    Wheezing  . Procaine Hcl Other (See Comments)    Passed out after being given this at dental appointment  . Levofloxacin Rash  . Oxycodone Other (See Comments)    constipation    Labs: Recent Labs    01/25/18 0542 01/26/18 0421 01/27/18 0527  HGB 11.2* 10.9* 11.7*  HCT 34.9* 33.4* 35.8*  PLT 143* 157 163  LABPROT 15.9* 18.3* 20.5*  INR 1.28 1.53 1.78    Estimated Creatinine Clearance: 69.6 mL/min (by C-G formula based on SCr of 0.8 mg/dL).  Assessment: CC/HPI: Fall with fx of both sides of pelvis with some soft tissue hemorrhage on CT.   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 CHA2DS2-VASc elevated. No surgery planned. CT pelvis in ER shows inferior and superior pubic rami fractures as well as small hematomas. Warfarin was held on admission and resumed 8/7. PTA Coumadin 2.5mg  TThS, 5mg  MWFSun, Admit INR 1.52  INR subtherapeutic today at 1.78. H/H stable, plts nml, no reports of bleeding currently.   Goal of Therapy:  INR 2-3 Monitor platelets by anticoagulation protocol: Yes   Plan:  Coumadin 6mg  po x 1 tonight  Daily INR Monitor s/sx of bleeding   Juanell Fairly, PharmD PGY1 Pharmacy Resident Phone [phone] 01/27/2018 9:34 AM    I discussed / reviewed the pharmacy note by Dr. Wilkie Aye and I agree with the resident's findings and plans as documented. HgB 11.7 improved,  Daviel Allegretto S. Alford Highland, PharmD, [place] Clinical Staff Pharmacist Pager [phone]

## 2018-01-27 NOTE — Progress Notes (Signed)
Occupational Therapy Treatment Patient Details Name: Wayne [name] MRN: [id number] DOB: 01-Jul-1929 Today's Date: 01/27/2018    History of present illness Pt is a 84 y/o male admitted after falling at home with mildly comminuted fractures of L superior and inferior pubic rami and soft tissue hemorrhage, small bilateral pleural effusions. PMH significant for but not limited to: HTN, stroke, TAVR, MV stenosis, A fib on coumadin, BPH, CAD, CHF.     OT comments  Pt continues to present with decreased activity tolerance due to fatigue and pain at left hip. Pt agreeable to participate in therapy and sit at EOB. Pt required Max A +1 for bed mobility and max encouragement due to fear of falling. Pt able to maintain sitting at EOB with Min Guard A to perform grooming, UB bathing, and LE exercises. Continue to recommend dc to post-acute rehab and will continue to follow acutely as admitted.    Follow Up Recommendations  SNF;Supervision/Assistance - 24 hour    Equipment Recommendations  Other (comment)(Defer to next venue)    Recommendations for Other Services PT consult    Precautions / Restrictions Precautions Precautions: Fall Restrictions Weight Bearing Restrictions: Yes LLE Weight Bearing: Weight bearing as tolerated       Mobility Bed Mobility Overal bed mobility: Needs Assistance Bed Mobility: Supine to Sit;Sit to Supine Rolling: Max assist   Supine to sit: Max assist;+2 for physical assistance Sit to supine: Max assist;+2 for physical assistance   General bed mobility comments: Max A +2 to bring BLEs towards EOB and elevate trunk. Use of bed pad to bring hips to EOB  Transfers                 General transfer comment: Defered due to pain    Balance Overall balance assessment: Needs assistance Sitting-balance support: Feet supported;Single extremity supported Sitting balance-Leahy Scale: Fair Sitting balance - Comments: min guard for safety, reported dizziness  that improved with time                                   ADL either performed or assessed with clinical judgement   ADL Overall ADL's : Needs assistance/impaired     Grooming: Min guard;Sitting;Wash/dry face Grooming Details (indicate cue type and reason): Pt washing his face at EOB with MI nGuard. Requiring at least one UE for sitting balance and support due to fear of falling Upper Body Bathing: Moderate assistance;Sitting Upper Body Bathing Details (indicate cue type and reason): Pt requiring Mod A to wash his back while seated at EOB. Pt able to maintain sitting balance at EOB.          Lower Body Dressing: Total assistance;Bed level Lower Body Dressing Details (indicate cue type and reason): Donned socks at bed level               General ADL Comments: Limited due to pain and fatigue     Vision       Perception     Praxis      Cognition Arousal/Alertness: Awake/alert Behavior During Therapy: WFL for tasks assessed/performed Overall Cognitive Status: Impaired/Different from baseline Area of Impairment: Orientation;Memory;Following commands;Safety/judgement;Problem solving                 Orientation Level: Disoriented to;Time   Memory: Decreased short-term memory(Did not recall PT coming earlier this AM) Following Commands: Follows one step commands with increased time Safety/Judgement: Decreased awareness of safety  Problem Solving: Slow processing;Requires verbal cues General Comments: Pt not recalling that PT came this AM.         Exercises Exercises: General Lower Extremity General Exercises - Lower Extremity Long Arc Quad: AROM;5 reps;Both;Seated    Shoulder Instructions       General Comments      Pertinent Vitals/ Pain       Pain Assessment: Faces Faces Pain Scale: Hurts whole lot Pain Location: L LE, pelvis Pain Descriptors / Indicators: Discomfort;Grimacing;Guarding Pain Intervention(s): Monitored during  session;Limited activity within patient's tolerance;Repositioned  Home Living                                          Prior Functioning/Environment              Frequency  Min 2X/week        Progress Toward Goals  OT Goals(current goals can now be found in the care plan section)  Progress towards OT goals: Progressing toward goals  Acute Rehab OT Goals Patient Stated Goal: to have less pain OT Goal Formulation: With patient Time For Goal Achievement: 02/05/18 Potential to Achieve Goals: Good ADL Goals Pt Will Perform Grooming: with set-up;sitting Pt Will Perform Upper Body Dressing: with set-up;sitting Pt Will Perform Lower Body Dressing: with min guard assist;sit to/from stand;with adaptive equipment Pt Will Transfer to Toilet: with min assist;stand pivot transfer;squat pivot transfer;bedside commode Pt Will Perform Toileting - Clothing Manipulation and hygiene: with min assist;sit to/from stand;sitting/lateral leans Pt/caregiver will Perform Home Exercise Program: Increased strength;Right Upper extremity;Left upper extremity;With theraband;With written HEP provided  Plan Discharge plan remains appropriate    Co-evaluation                 AM-PAC PT "6 Clicks" Daily Activity     Outcome Measure   Help from another person eating meals?: None Help from another person taking care of personal grooming?: A Little Help from another person toileting, which includes using toliet, bedpan, or urinal?: Total Help from another person bathing (including washing, rinsing, drying)?: A Lot Help from another person to put on and taking off regular upper body clothing?: A Little Help from another person to put on and taking off regular lower body clothing?: Total 6 Click Score: 14    End of Session    OT Visit Diagnosis: Other abnormalities of gait and mobility (R26.89);Muscle weakness (generalized) (M62.81);History of falling (Z91.81);Pain Pain -  Right/Left: Left Pain - part of body: Hip;Leg   Activity Tolerance Patient limited by pain;Patient limited by fatigue   Patient Left in bed;with call bell/phone within reach;with bed alarm set   Nurse Communication Mobility status;Precautions        Time: 6294-7654 OT Time Calculation (min): 15 min  Charges: OT General Charges $OT Visit: 1 Visit OT Treatments $Self Care/Home Management : 8-22 mins  [place], OTR/L Acute Rehab Pager: [phone] Office: [place] 01/27/2018, 11:41 AM

## 2018-01-27 NOTE — Progress Notes (Signed)
PROGRESS NOTE    Wayne [name]  SEG:[id number] DOB: 06/24/1929 DOA: 01/21/2018 PCP: [name], DO      Brief Narrative:  Mr. Drumwright is a 84 y.o. M with Afib on warfarin, AS s/p AVR bioprosthetic, HTN, coronary disease and hx of stroke who presented with fall and hip pain.  CT pelvis in ER shows inferior and superior pubic rami fractures as well as small hematomas.  Admitted for anticoagulation management in setting of hematomas.  Orthopedics and Cardiology consulted.  Slowly improving.  DC to SNF pending insurance approval.  Clinical social work following.   Assessment & Plan:  Pelvic hematoma Acute blood loss anemia Hemoglobin has stabilized.  Orthostatic changes better.  Thrombocytopenia Possibly due to acute blood loss.  Resolved today.  Periodically follow CBCs.  Left minimally displaced superior and inferior pubic ramus fractures -Orthopedic consultation and follow-up appreciated.  I discussed with orthopedic/Brad Dixon, PA-C on 8/7 who indicatet that patient's hematoma is small and should resolve on its own and okay to start Coumadin anticoagulation.  Weightbearing as tolerated and outpatient follow-up with orthopedics in 4 weeks.  I also discussed with radiologist on 8/7 who reviewed images from 8/5 and indicated that patient had small amount of hemorrhage. Opioids were discontinued based on family's request.  Ongoing intermittent pain.  Discussed with spouse and changed Tylenol to 1 g 3 times daily scheduled for a couple days then can change to as needed. Patient seems to be in more pain over the last 24 hours in addition to pain in his left shoulder.  Tylenol not adequately controlling pain.  Patient and spouse willing to try low-dose tramadol.  X-ray of left shoulder without acute findings.  Topical Voltaren gel added to left shoulder.  Orthostatic hypotension -May be complicated by opioids which were discontinued.  Encouraged oral fluid intake.  Has TED hoses  on.  Improved.  Atrial fibrillation, chronic CHA2DS2-Vasc 5 (age, HTN, stroke).  On Warfarin, INR subtherapeutic, warfarin held at admission.  Given history of stroke, and relative stability of pelvic hematomas, warfarin resumed 8/7 and INR starting to rise.  Controlled ventricular rate. -Cardiology signed off 8/8.  Outpatient follow-up with Dr. Stanford Breed and Dr. Burt Knack (TAVR).  and Coumadin clinic for INR checks. -Continue metoprolol, digoxin.  Stable.  History of TAVR Chronic mitral regurgitation  Coronary disease -Continue BB  Other medications -Continue alfuzosin -Continue finasteride.  These medications may be contributing some to orthostatic hypotension but stopping them may make his urinary issues worse.   Mental status changes Possibly due to opioids, discontinued 8/8.  DC Ambien.  No CO2 narcosis.  No focal neurological deficits.  Monitor closely.  CT head and neck 8/5 without acute findings.  Mental status changes significantly improved compared to 8/7 but may have periodic confusion and anxiety which may not be entirely unexpected given advanced age, possible underlying cognitive impairment and hospital delirium.  Delirium precautions.  Stable.  Constipation Complicated by immobility and poor oral intake.  Had large BM after Dulcolax suppository on 8/8.  Continue bowel regimen.  Had BM 8/11.  Cough Reportedly had cough on drinking liquids last night.  Speech therapy evaluated and recommended regular diet and thin liquids.  No cough reported.    DVT prophylaxis: N/A on warfarin Code Status: DO NOT RESUSCITATE Family Communication: Discussed in detail with patient's spouse at bedside.  Updated care as have been doing daily MDM and disposition Plan: DC to SNF pending insurance approval.    Consultants:   Cardiology  Orthopedics  Procedures:   None  Antimicrobials:   None    Subjective: Patient seems to be in more pain over the last 24 hours in addition to  pain in his left shoulder.  Tylenol not adequately controlling pain.  No mental status changes reported.  Objective: Vitals:   01/26/18 1628 01/26/18 2049 01/27/18 0547 01/27/18 1113  BP: 135/64 112/67 116/69 (!) 108/59  Pulse: 84 91 95 83  Resp: 20  18 18   Temp: (!) 97.3 F (36.3 C) 98.4 F (36.9 C) (!) 97.5 F (36.4 C) (!) 97.4 F (36.3 C)  TempSrc: Oral  Oral Oral  SpO2: 96% 93% 95% 97%  Weight:   78.5 kg   Height:        Intake/Output Summary (Last 24 hours) at 01/27/2018 1543 Last data filed at 01/27/2018 1419 Gross per 24 hour  Intake 480 ml  Output 800 ml  Net -320 ml   Filed Weights   01/25/18 0504 01/26/18 0332 01/27/18 0547  Weight: 77.9 kg 77.9 kg 78.5 kg    Examination: Significant change in exam compared to yesterday.  Specifically no acute findings noted on left shoulder exam today. General appearance: Pleasant elderly male, moderately built and thinly nourished lying comfortably propped up in bed and does not appear in painful distress.. Stable. Cardiac: S1 and S2 heard, irregularly irregular.  No JVD, murmurs or pedal edema.  Respiratory: Clear to auscultation.  No increased work of breathing.  Stable. Abdomen: Abdomen is nondistended, soft and nontender.  No organomegaly or masses appreciated.  Normal bowel sounds heard.  Stable. MSK: No deformities or effusions, but he guards the left leg, also right leg movement is somewhat limited.  No new findings/stable.  Bruising of right upper extremity which his wife indicates has been present even prior to fall.  Patient has bilateral TED hose. Neuro: Alert this morning and oriented x2.  No focal neurological deficits.  Improved and stable. Psych: Pleasant and appropriate.  Judgment and insight impaired.    Data Reviewed: I have personally reviewed following labs and imaging studies:  CBC: Recent Labs  Lab 01/21/18 1949  01/23/18 0457 01/24/18 0425 01/25/18 0542 01/26/18 0421 01/27/18 0527  WBC 8.4   < >  9.0 8.6 8.5 7.5 7.9  NEUTROABS 6.5  --   --   --   --   --   --   HGB 12.2*   < > 11.0* 11.0* 11.2* 10.9* 11.7*  HCT 38.6*   < > 33.9* 34.3* 34.9* 33.4* 35.8*  MCV 94.1   < > 93.4 94.5 93.6 92.3 92.0  PLT 151   < > 126* 128* 143* 157 163   < > = values in this interval not displayed.   Basic Metabolic Panel: Recent Labs  Lab 01/21/18 1949 01/22/18 0635 01/23/18 0457 01/24/18 0425  NA 141 140 137 137  K 4.1 4.1 4.1 4.1  CL 102 104 102 101  CO2 32 26 26 27   GLUCOSE 107* 125* 110* 98  BUN 17 15 15 14   CREATININE 1.23 0.94 0.92 0.80  CALCIUM 9.0 8.5* 8.4* 8.4*   GFR: Estimated Creatinine Clearance: 69.6 mL/min (by C-G formula based on SCr of 0.8 mg/dL). Liver Function Tests: Recent Labs  Lab 01/21/18 1949  AST 24  ALT 14  ALKPHOS 39  BILITOT 1.0  PROT 6.6  ALBUMIN 3.7   Coagulation Profile: Recent Labs  Lab 01/23/18 0457 01/24/18 0425 01/25/18 0542 01/26/18 0421 01/27/18 0527  INR 1.30 1.26 1.28 1.53 1.78  Cardiac Enzymes: Recent Labs  Lab 01/22/18 0213  CKTOTAL 163   CBG: Recent Labs  Lab 01/23/18 1246  GLUCAP 91      Radiology Studies: Dg Shoulder Left Port  Result Date: 01/27/2018 CLINICAL DATA:  Left shoulder pain. EXAM: LEFT SHOULDER - 1 VIEW COMPARISON:  None. FINDINGS: There is no evidence of acute fracture or dislocation. Moderate proliferative disease is seen involving the acromion. The [facility] joint shows normal alignment. No bony lesions or destruction. Soft tissues are unremarkable. IMPRESSION: Degenerative proliferative disease involving the acromion. Electronically Signed   By: Aletta Edouard M.D.   On: 01/27/2018 08:57        Scheduled Meds: . acetaminophen  1,000 mg Oral TID  . alfuzosin  10 mg Oral Q breakfast  . cholecalciferol  2,000 Units Oral Daily  . diclofenac sodium  4 g Topical QID  . digoxin  125 mcg Oral Daily  . docusate sodium  100 mg Oral QPM  . finasteride  5 mg Oral QHS  . Melatonin  6 mg Oral QHS  . metoprolol  tartrate  12.5 mg Oral BID  . multivitamin with minerals  1 tablet Oral Daily  . polyethylene glycol  17 g Oral BID  . senna  2 tablet Oral Daily  . warfarin  6 mg Oral ONCE-1800  . Warfarin - Pharmacist Dosing Inpatient   Does not apply q1800   Continuous Infusions:   LOS: 5 days    Time spent: 25 minutes   Vernell Leep, MD, FACP, [facility]. [facility] Pager [phone]  If 7PM-7AM, please contact night-coverage [email or web address] Password [facility] 01/27/2018, 3:43 PM

## 2018-01-28 DIAGNOSIS — S32592D Other specified fracture of left pubis, subsequent encounter for fracture with routine healing: Secondary | ICD-10-CM | POA: Diagnosis not present

## 2018-01-28 DIAGNOSIS — R2689 Other abnormalities of gait and mobility: Secondary | ICD-10-CM | POA: Diagnosis not present

## 2018-01-28 DIAGNOSIS — M25512 Pain in left shoulder: Secondary | ICD-10-CM

## 2018-01-28 DIAGNOSIS — I10 Essential (primary) hypertension: Secondary | ICD-10-CM | POA: Diagnosis not present

## 2018-01-28 DIAGNOSIS — I951 Orthostatic hypotension: Secondary | ICD-10-CM | POA: Diagnosis not present

## 2018-01-28 DIAGNOSIS — D62 Acute posthemorrhagic anemia: Secondary | ICD-10-CM | POA: Diagnosis not present

## 2018-01-28 DIAGNOSIS — R630 Anorexia: Secondary | ICD-10-CM | POA: Diagnosis not present

## 2018-01-28 DIAGNOSIS — G459 Transient cerebral ischemic attack, unspecified: Secondary | ICD-10-CM | POA: Diagnosis not present

## 2018-01-28 DIAGNOSIS — M25552 Pain in left hip: Secondary | ICD-10-CM | POA: Diagnosis not present

## 2018-01-28 DIAGNOSIS — G9341 Metabolic encephalopathy: Secondary | ICD-10-CM | POA: Diagnosis not present

## 2018-01-28 DIAGNOSIS — R279 Unspecified lack of coordination: Secondary | ICD-10-CM | POA: Diagnosis not present

## 2018-01-28 DIAGNOSIS — S32592S Other specified fracture of left pubis, sequela: Secondary | ICD-10-CM | POA: Diagnosis not present

## 2018-01-28 DIAGNOSIS — I482 Chronic atrial fibrillation: Secondary | ICD-10-CM | POA: Diagnosis not present

## 2018-01-28 DIAGNOSIS — I5032 Chronic diastolic (congestive) heart failure: Secondary | ICD-10-CM | POA: Diagnosis not present

## 2018-01-28 DIAGNOSIS — Z952 Presence of prosthetic heart valve: Secondary | ICD-10-CM | POA: Diagnosis not present

## 2018-01-28 DIAGNOSIS — Z8673 Personal history of transient ischemic attack (TIA), and cerebral infarction without residual deficits: Secondary | ICD-10-CM | POA: Diagnosis not present

## 2018-01-28 DIAGNOSIS — S32502D Unspecified fracture of left pubis, subsequent encounter for fracture with routine healing: Secondary | ICD-10-CM | POA: Diagnosis not present

## 2018-01-28 DIAGNOSIS — W19XXXD Unspecified fall, subsequent encounter: Secondary | ICD-10-CM | POA: Diagnosis not present

## 2018-01-28 DIAGNOSIS — K5900 Constipation, unspecified: Secondary | ICD-10-CM | POA: Diagnosis not present

## 2018-01-28 DIAGNOSIS — I2510 Atherosclerotic heart disease of native coronary artery without angina pectoris: Secondary | ICD-10-CM | POA: Diagnosis not present

## 2018-01-28 DIAGNOSIS — D696 Thrombocytopenia, unspecified: Secondary | ICD-10-CM | POA: Diagnosis not present

## 2018-01-28 DIAGNOSIS — Z743 Need for continuous supervision: Secondary | ICD-10-CM | POA: Diagnosis not present

## 2018-01-28 LAB — PROTIME-INR
INR: 2.09
PROTHROMBIN TIME: 23.3 s — AB (ref 11.4–15.2)

## 2018-01-28 MED ORDER — WARFARIN SODIUM 5 MG PO TABS: 5.0000 mg | ORAL_TABLET | Freq: Every day | ORAL | Status: DC

## 2018-01-28 MED ORDER — FUROSEMIDE 40 MG PO TABS: 20.0000 mg | ORAL_TABLET | Freq: Two times a day (BID) | ORAL | Status: DC | PRN

## 2018-01-28 MED ORDER — SENNA 8.6 MG PO TABS: 2.0000 | ORAL_TABLET | Freq: Every day | ORAL | Status: DC

## 2018-01-28 MED ORDER — POLYETHYLENE GLYCOL 3350 17 G PO PACK: 17.0000 g | PACK | Freq: Two times a day (BID) | ORAL | Status: DC

## 2018-01-28 MED ORDER — BISACODYL 10 MG RE SUPP: 10.0000 mg | Freq: Every day | RECTAL | Status: DC | PRN

## 2018-01-28 MED ORDER — TRAMADOL HCL 50 MG PO TABS: 50.0000 mg | ORAL_TABLET | Freq: Four times a day (QID) | ORAL | 0 refills | Status: DC | PRN

## 2018-01-28 MED ORDER — WARFARIN SODIUM 3 MG PO TABS: 6.0000 mg | ORAL_TABLET | Freq: Once | ORAL | Status: DC

## 2018-01-28 MED ORDER — WARFARIN SODIUM 3 MG PO TABS
6.0000 mg | ORAL_TABLET | ORAL | Status: AC
  Administered 2018-01-28: 6 mg via ORAL
  Filled 2018-01-28: qty 2

## 2018-01-28 NOTE — Progress Notes (Signed)
  Transitions of Care Polypharmacy Medication Deprescribing Review  Wayne [name] is a 84 y.o. male admitted for anticoagulation management in the setting of hematomas on 01/21/2018.   The patient's PTA medications and comorbidities were reviewed to identify opportunities for medication optimization through deprescribing.   Short-Term Recommendations: . Discontinue methocarbamol as patient is not using it and it is on the Beers List of medications to avoid in the elderly. Consider use of baclofen or tizanidine if muscle relaxant is needed. . Switch metoprolol tartrate to metoprolol succinate for CHF mortality benefit.   Long-Term Recommendations: . Consider risk/benefit of continued digoxin, especially as patient is on beta blocker for Afib rate control and HFpEF symptoms may not be improved with therapy. . Consider need for alfuzosin + finasteride for BPH, as alfuzosin can cause orthostatic hypotension.   Patient Assessment:  . Extent of Polypharmacy: 20 PTA medications, hyperpolypharmacy (10+ meds) o Associated risk: severe o Number of home Rx meds: 13 o Number of home OTC meds: 7 . Comorbidity Polypharmacy Score: 32 o Associated risk: morbid . Charlson Comorbidity Index: 6 o Associated risk: severe-morbid (2% estimated 10-year survival) . GerontoNet Adverse Drug Event Risk Score: 6 o Associated risk: moderate (~12% risk ADE)  Overall risk: Moderate; hyperpolypharmacy but with many PRN meds, lower Charlson Comorbidity Index and ADE risk score than could be expected from comorbidities   Medication Assessment:  . Potentially Inappropriate Medications (PIMs)  o High risk medications: 0 o Moderate risk medications: 3 - Furosemide, methocarbamol, tramadol o Low risk medications: 1 - Digoxin  . Medications Associated with Geriatric Syndromes (MAGS): 10 o Metoprolol tartrate, senna, bisacodyl, warfarin, dextromethorphan-guaifenesin, furosemide, methocarbamol, tramadol,  digoxin, alfuzosin  Overall risk: Moderate; most medications are of moderate to low risk, most already at lower doses   The patient was interviewed to determine his interest in making medication changes. Shared decision-making was used to determine the optimal medications to deprescribe.   Patient Deprescribing Readiness: . Patient Opinions About Deprescribing survey score: 11 o Score of 4-8: negative patient opinion of deprescribing; deprescribing likely to be unsuccessful o Score of 9-11: intermediate patient opinion of deprescribing; deprescribing may be successful o Score of 12-16: positive patient opinion of deprescribing; deprescribing likely to be successful   Shared Decision-Making Discussion:  . Patient expressed interest in deprescribing but did note that he felt like his medications were appropriate overall. He and his wife reported no difficulty in keeping up with his medications and were knowledgeable about their uses and dosages.  . The patient was advised about risks with methocarbamol and advised of potential alternatives (baclofen, tizanidine). Patient reported having only used methocarbamol a few times without much success, and patient and wife deny past trials of alternative muscle relaxants. . Patient reported using PRN medications sparingly and had no additional concerns.   Please see above for final recommendations.  Oralia Manis, Student-PharmD 01/28/2018 2:13 PM

## 2018-01-28 NOTE — Progress Notes (Signed)
[facility] for Coumadin Indication: atrial fibrillation  Allergies  Allergen Reactions  . Penicillins Rash and Other (See Comments)    PATIENT HAS HAD A PCN REACTION WITH IMMEDIATE RASH, FACIAL/TONGUE/THROAT SWELLING, SOB, OR LIGHTHEADEDNESS WITH HYPOTENSION:  #  #  #  YES  #  #  #   Has patient had a PCN reaction causing severe rash involving mucus membranes or skin necrosis: No Has patient had a PCN reaction that required hospitalization No Has patient had a PCN reaction occurring within the last 10 years: No If all of the above answers are "NO", then may proceed with Cephalosporin use.  . Prednisone Other (See Comments)    Wheezing  . Procaine Hcl Other (See Comments)    Passed out after being given this at dental appointment  . Levofloxacin Rash  . Oxycodone Other (See Comments)    constipation    Labs: Recent Labs    01/26/18 0421 01/27/18 0527 01/28/18 0351  HGB 10.9* 11.7*  --   HCT 33.4* 35.8*  --   PLT 157 163  --   LABPROT 18.3* 20.5* 23.3*  INR 1.53 1.78 2.09    Estimated Creatinine Clearance: 69.6 mL/min (by C-G formula based on SCr of 0.8 mg/dL).  Assessment: CC/HPI: Fall with fx of both sides of pelvis with some soft tissue hemorrhage on CT.   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 CHA2DS2-Vasc 5.  No surgery planned. CT pelvis in ER shows inferior and superior pubic rami fractures as well as small hematomas. INR up to 2.09. HgB 11.7 improved, plts nml, no bleeding reported - PTA Coumadin 2.5mg  TThS, 5mg  MWFSun, Admit INR 1.52  Goal of Therapy:  INR 2-3 Monitor platelets by anticoagulation protocol: Yes   Plan:  Coumadin 6mg  po x 1 tonight  Daily INR Monitor s/sx of bleeding   Michiel Sivley S. Alford Highland, PharmD, [facility] Clinical Staff Pharmacist Pager  [phone]  01/28/2018 8:08 AM

## 2018-01-28 NOTE — Progress Notes (Addendum)
Addendum  I was advised by CSW initially that patients insurance for SNF admission had been approved following which I did his discharge paperwork.  Subsequently I was informed by CSW stating that SNF had inadvertently stated that his insurance had been approved while it had not.  Also that his insurance had denied SNF admission and a peer to peer discussion was advised.  I did a peer to peer discussion with Dr. Erling Cruz, discussed patient's care and updated her on his status.  She then reversed the denial and approved SNF admission.  Wayne Leep, MD, FACP, [facility]. [facility] Pager [phone]  If 7PM-7AM, please contact night-coverage [email or web address] Password [facility] 01/28/2018, 2:46 PM

## 2018-01-28 NOTE — Progress Notes (Signed)
Physical Therapy Treatment Patient Details Name: Wayne [name] MRN: [id number] DOB: 1930-04-06 Today's Date: 01/28/2018    History of Present Illness Pt is a 84 y/o male admitted after falling at home with mildly comminuted fractures of L superior and inferior pubic rami and soft tissue hemorrhage, small bilateral pleural effusions. PMH significant for but not limited to: HTN, stroke, TAVR, MV stenosis, A fib on coumadin, BPH, CAD, CHF.      PT Comments    Continuing work on functional mobility and activity tolerance;  Better pain management regimen now, and Mr. Lucianne Muss was able to stand to RW, and initiate gait training, gait sequencing, and he was able to take small steps with UE support on RW (and recliner pushed behind him to boost confidence; Today he responded well to gentle encouragement; no signs of dizziness or orthostasis with upright activity today; continue to recommend SNF for rehab to maximize independence and safety with mobility    Follow Up Recommendations  SNF;Supervision/Assistance - 24 hour     Equipment Recommendations  None recommended by PT    Recommendations for Other Services       Precautions / Restrictions Precautions Precautions: Fall Restrictions LLE Weight Bearing: Weight bearing as tolerated    Mobility  Bed Mobility               General bed mobility comments: in chair upon arrival  Transfers Overall transfer level: Needs assistance Equipment used: Rolling walker (2 wheeled) Transfers: Sit to/from Stand Sit to Stand: Mod assist;Min assist;+2 physical assistance;+2 safety/equipment         General transfer comment: Cues for hand placement; mod assist to power up to stand with initial sit to stand; min assist with second sit to stand; Needing mod assist to control descent to sit   Ambulation/Gait Ambulation/Gait assistance: Mod assist;+2 physical assistance;+2 safety/equipment Gait Distance (Feet): 5 Feet Assistive device:  Rolling walker (2 wheeled) Gait Pattern/deviations: Decreased stance time - left;Decreased step length - right Gait velocity: extremely slow   General Gait Details: Multimodal cues for gait sequence; heavy dependence on UE support on RW, especially when in R single limb stance and advancing LLE; assist to advance LLE for stepping   Stairs             Wheelchair Mobility    Modified Rankin (Stroke Patients Only)       Balance     Sitting balance-Leahy Scale: Fair       Standing balance-Leahy Scale: Poor Standing balance comment: bil UE assist in standing                            Cognition Arousal/Alertness: Awake/alert Behavior During Therapy: WFL for tasks assessed/performed Overall Cognitive Status: Impaired/Different from baseline Area of Impairment: Problem solving                             Problem Solving: Slow processing;Difficulty sequencing;Requires tactile cues;Requires verbal cues        Exercises      General Comments        Pertinent Vitals/Pain Pain Assessment: Faces Faces Pain Scale: Hurts whole lot Pain Location: L LE, pelvis Pain Descriptors / Indicators: Discomfort;Grimacing;Guarding Pain Intervention(s): Monitored during session    Home Living                      Prior Function  PT Goals (current goals can now be found in the care plan section) Acute Rehab PT Goals Patient Stated Goal: to have less pain Progress towards PT goals: Progressing toward goals    Frequency    Min 3X/week      PT Plan Current plan remains appropriate    Co-evaluation              AM-PAC PT "6 Clicks" Daily Activity  Outcome Measure  Difficulty turning over in bed (including adjusting bedclothes, sheets and blankets)?: Unable Difficulty moving from lying on back to sitting on the side of the bed? : Unable Difficulty sitting down on and standing up from a chair with arms (e.g.,  wheelchair, bedside commode, etc,.)?: Unable Help needed moving to and from a bed to chair (including a wheelchair)?: A Lot Help needed walking in hospital room?: A Lot Help needed climbing 3-5 steps with a railing? : Total 6 Click Score: 8    End of Session Equipment Utilized During Treatment: Gait belt Activity Tolerance: Patient tolerated treatment well Patient left: in chair;with call bell/phone within reach;with family/visitor present Nurse Communication: Mobility status;Precautions PT Visit Diagnosis: Unsteadiness on feet (R26.81);Other abnormalities of gait and mobility (R26.89);Muscle weakness (generalized) (M62.81);Dizziness and giddiness (R42);Pain Pain - Right/Left: Left Pain - part of body: Hip     Time: 3435-6861 PT Time Calculation (min) (ACUTE ONLY): 24 min  Charges:  $Gait Training: 8-22 mins $Therapeutic Activity: 8-22 mins                     Roney Marion, PT  Acute Rehabilitation Services Pager [phone] Office [place] 01/28/2018, 3:43 PM

## 2018-01-28 NOTE — Clinical Social Work Note (Addendum)
CSW tried calling [facility] admissions coordinator to check status of insurance authorization. No answer. Left message.  [name], [place] [phone]  10:38 am Patient has insurance approval for discharge to SNF today. CSW paged MD to notify.  [name], [place] [phone]  12:10 pm Received call from [facility] admissions coordinator. They were mistaken and insurance authorization is actually still pending. [employer] is requesting updated therapy notes. CSW sent those on the hub this morning. They are hopeful they will still get auth today. CSW updated patient, wife, and daughter at bedside. Paged MD to notify.  [name], [place] [phone]  1:59 pm Insurance denied authorization. Patient, wife, and daughter notified. CSW paged MD requesting he call and schedule peer-to-peer review by noon tomorrow [phone] claim #: [id number]). He will call but wants PT to work with him out of bed today due to medication adjustments that have helped with pain control. CSW left voicemail with acute rehab dept.  [name], [place] [phone]  3:00 pm Per MD, [place] Medicare has reversed denial. CSW notified SNF admissions coordinator. She will confirm with business office and call CSW back.  [name], [place]

## 2018-01-28 NOTE — Clinical Social Work Note (Signed)
CSW facilitated patient discharge including contacting patient family and facility to confirm patient discharge plans. Clinical information faxed to facility and family agreeable with plan. CSW arranged ambulance transport via [facility] to [facility]. RN to call report prior to discharge [phone] Room 404B).  CSW will sign off for now as social work intervention is no longer needed. Please consult [place] again if new needs arise.  [name], [place]

## 2018-01-28 NOTE — Progress Notes (Signed)
Report given to Alex at [facility].

## 2018-01-28 NOTE — Clinical Social Work Placement (Signed)
   CLINICAL SOCIAL WORK PLACEMENT  NOTE  Date:  01/28/2018  Patient Details  Name: Wayne [name] MRN: [id number] Date of Birth: 05-May-1930  Clinical Social Work is seeking post-discharge placement for this patient at the [place] level of care (*CSW will initial, date and re-position this form in  chart as items are completed):  Yes   Patient/family provided with [place] Work Department's list of facilities offering this level of care within the geographic area requested by the patient (or if unable, by the patient's family).  Yes   Patient/family informed of their freedom to choose among providers that offer the needed level of care, that participate in Medicare, Medicaid or managed care program needed by the patient, have an available bed and are willing to accept the patient.  Yes   Patient/family informed of [facility]'s ownership interest in [facility] and [facility], as well as of the fact that they are under no obligation to receive care at these facilities.  PASRR submitted to EDS on 01/23/18     PASRR number received on 01/23/18     Existing PASRR number confirmed on       FL2 transmitted to all facilities in geographic area requested by pt/family on 01/23/18     FL2 transmitted to all facilities within larger geographic area on       Patient informed that his/her managed care company has contracts with or will negotiate with certain facilities, including the following:        Yes   Patient/family informed of bed offers received.  Patient chooses bed at [place] [place]     Physician recommends and patient chooses bed at      Patient to be transferred to [facility] on 01/28/18.  Patient to be transferred to facility by [facility]     Patient family notified on 01/28/18 of transfer.  Name of family member notified:  Devra Dopp     PHYSICIAN       Additional Comment:     _______________________________________________ Candie Chroman, LCSW 01/28/2018, 3:53 PM

## 2018-01-28 NOTE — Discharge Summary (Signed)
Physician Discharge Summary  Wayne [name] FGH:[id number] DOB: 09-Oct-1929  PCP: Wayne Dance, DO  Admit date: 01/21/2018 Discharge date: 01/28/2018  Recommendations for Outpatient Follow-up:  1. MD at SNF in 3 days with repeat labs (CBC, BMP, PT & INR).  Please adjust Coumadin dose as needed.  Please forward these lab results to patient's [facility] Coumadin clinic as well. 2. Dr. Mellody [name], PCP upon discharge from SNF. 3. Dr. Netta [name], Orthopedics in 4 weeks. 4. Dr. Kirk [name], Cardiology 5. Dr. Sherren [name], Cardiology.  Home Health: Not applicable.  Patient being discharged to [facility]. Equipment/Devices: None  Discharge Condition: Improved and stable CODE STATUS: DNR Diet recommendation: Heart healthy diet.  Discharge Diagnoses:  Principal Problem:   Fall Active Problems:   Chronic atrial fibrillation (HCC)   HTN (hypertension)   CAD (coronary artery disease)   History of stroke   Chronic diastolic CHF (congestive heart failure) (HCC)   S/P TAVR (transcatheter aortic valve replacement)   Closed fracture of left inferior pubic ramus ([place])   Brief Summary: Mr. Wayne [name] is a 84 y.o. M with Afib on warfarin, AS s/p AVR bioprosthetic, HTN, coronary disease and hx of stroke who presented with fall and hip pain.  CT pelvis in ER showed inferior and superior pubic rami fractures as well as small hematomas.  Admitted for anticoagulation management in setting of hematomas.  Orthopedics and Cardiology consulted.    Assessment & Plan:  Pelvic hematoma Acute blood loss anemia Hemoglobin stable.  Orthostatic changes improved.  Thrombocytopenia Possibly due to acute blood loss.  Resolved.  Left minimally displaced superior and inferior pubic ramus fractures -Orthopedic consultation and follow-up appreciated.    As per discussion with Orthopedics on 8/7 who indicateted that patient's hematoma is small and should resolve on its  own and okay to start Coumadin anticoagulation.  Weightbearing as tolerated and outpatient follow-up with Orthopedics in 4 weeks.  I also discussed with Radiologist on 8/7 who reviewed images from 8/5 and indicated that patient had small amount of hemorrhage. Opioids were initially discontinued based on family's request.  Tried acetaminophen 1 g 3 times daily with suboptimal pain control.  After discussing with patient and spouse, finally started tramadol with improved control of his pain and no reported change in mental status.  Patient had developed some mental status changes with Vicodin couple days prior. Stable.  Weightbearing as tolerated.  Outpatient follow-up with orthopedics in 4 weeks. -Patient had reported some left shoulder pain.  X-ray was negative for acute findings but showed degenerative changes.  Likely arthritic and due to fall.  Supportive treatment.  Orthostatic hypotension -May be complicated by opioids which were discontinued.  Encouraged oral fluid intake.  Has TED hoses on.  Improved.  Atrial fibrillation, chronic CHA2DS2-Vasc 5 (age, HTN, stroke).  On Warfarin, INR subtherapeutic, warfarin held at admission.  Given history of stroke, and relative stability of pelvic hematomas, warfarin resumed 8/7 and INR therapeutic.  Controlled ventricular rate. -Cardiology signed off 8/8.  Outpatient follow-up with Dr. Stanford [name] and Dr. Burt [name] (TAVR).  and Coumadin clinic for INR checks. -Continue metoprolol, digoxin.  Stable.  INR finally is therapeutic today.  History of TAVR Chronic mitral regurgitation  Coronary disease -Continue BB  Other medications -Continue alfuzosin -Continue finasteride.     Mental status changes Possibly due to opioids, discontinued 8/8.  DC Ambien.  No CO2 narcosis.  No focal neurological deficits.  Monitor closely.  CT head and neck 8/5 without acute  findings.  Mental status changes significantly improved compared to 8/7 but may have periodic  confusion, memory impairment and anxiety which may not be entirely unexpected given advanced age, likely underlying cognitive impairment and hospital delirium.  Delirium precautions.    Improved and stable.  Constipation Complicated by immobility and poor oral intake.  Had large BM after Dulcolax suppository on 8/8.  Continue bowel regimen.  Had BM 8/11.  Cough Reportedly had cough on drinking liquids last night.  Speech therapy evaluated and recommended regular diet and thin liquids.  No cough reported.    Consultants:   Cardiology  Orthopedics  Procedures:   None  Discharge Instructions  Discharge Instructions    (HEART FAILURE PATIENTS) Call MD:  Anytime you have any of the following symptoms: 1) 3 pound weight gain in 24 hours or 5 pounds in 1 week 2) shortness of breath, with or without a dry hacking cough 3) swelling in the hands, feet or stomach 4) if you have to sleep on extra pillows at night in order to breathe.   Complete by:  As directed    Call MD for:  difficulty breathing, headache or visual disturbances   Complete by:  As directed    Call MD for:  extreme fatigue   Complete by:  As directed    Call MD for:  persistant dizziness or light-headedness   Complete by:  As directed    Call MD for:  persistant nausea and vomiting   Complete by:  As directed    Call MD for:  severe uncontrolled pain   Complete by:  As directed    Call MD for:  temperature >100.4   Complete by:  As directed    Diet - low sodium heart healthy   Complete by:  As directed    Increase activity slowly   Complete by:  As directed        Medication List    TAKE these medications   acetaminophen 500 MG tablet Commonly known as:  TYLENOL Take 1,000 mg by mouth every 6 (six) hours as needed for moderate pain or headache.   alfuzosin 10 MG 24 hr tablet Commonly known as:  UROXATRAL Take 10 mg by mouth daily with breakfast.   ARTIFICIAL TEAR OP Place 1 drop into both eyes daily  as needed (dry eyes).   BIOFREEZE EX Apply 1 application topically 3 (three) times daily as needed (muscle pain in back). alterrnates between [employer] and [employer]mu   BLUE-EMU MAXIMUM STRENGTH EX Apply 1 application topically 3 (three) times daily as needed (muscle pain in back). alterrnates between [employer] and [employer]   bisacodyl 10 MG suppository Commonly known as:  DULCOLAX Place 1 suppository (10 mg total) rectally daily as needed for moderate constipation.   Cholecalciferol 2000 units Tabs Take 1 tablet (2,000 Units total) by mouth daily.   clindamycin 300 MG capsule Commonly known as:  CLEOCIN Take 2 capsules (600 mg total) by mouth as directed. Take one hour prior to dental visits.   digoxin 0.125 MG tablet Commonly known as:  LANOXIN Take 1 tablet (125 mcg total) by mouth daily.   docusate sodium 100 MG capsule Commonly known as:  COLACE Take 100 mg by mouth every evening.   finasteride 5 MG tablet Commonly known as:  PROSCAR Take 5 mg by mouth at bedtime.   furosemide 40 MG tablet Commonly known as:  LASIX Take 0.5 tablets (20 mg total) by mouth 2 (two) times  daily as needed for fluid.   Melatonin 1 MG Tabs Take 5 mg by mouth at bedtime.   methocarbamol 500 MG tablet Commonly known as:  ROBAXIN Take 500 mg by mouth as needed (cramps).   metoprolol tartrate 25 MG tablet Commonly known as:  LOPRESSOR Take 0.5 tablets (12.5 mg total) by mouth 2 (two) times daily.   MUCINEX DM 30-600 MG Tb12 Take 1-2 tablets by mouth every 12 (twelve) hours as needed (for cough/congestion.).   polyethylene glycol packet Commonly known as:  MIRALAX / GLYCOLAX Take 17 g by mouth 2 (two) times daily. What changed:  when to take this   potassium chloride 10 MEQ tablet Commonly known as:  K-DUR,KLOR-CON Take 1 tablet (10 mEq total) by mouth daily.   senna 8.6 MG Tabs tablet Commonly known as:  SENOKOT Take 2 tablets (17.2 mg total) by mouth daily. Start taking on:   01/29/2018   sodium chloride 0.65 % Soln nasal spray Commonly known as:  OCEAN Place 1 spray into both nostrils daily as needed for congestion.   traMADol 50 MG tablet Commonly known as:  ULTRAM Take 1 tablet (50 mg total) by mouth every 6 (six) hours as needed for severe pain.   triamcinolone cream 0.1 % Commonly known as:  KENALOG Apply 1 application topically daily as needed (dry skin).   warfarin 5 MG tablet Commonly known as:  COUMADIN Take as directed. If you are unsure how to take this medication, talk to your nurse or doctor. Original instructions:  Take 1 tablet (5 mg total) by mouth daily at 6 PM. Start taking on:  01/29/2018 What changed:    medication strength  how much to take  how to take this  when to take this  additional instructions       Contact information for follow-up providers    Wayne Dance, DO. Schedule an appointment as soon as possible for a visit.   Specialty:  Family Medicine Why:  Upon discharge from SNF. Contact information: [place] [address] [phone]        Lelon Perla, MD. Schedule an appointment as soon as possible for a visit.   Specialty:  Cardiology Contact information: [address] [place] [address] [phone]        MD at SNF. Schedule an appointment as soon as possible for a visit in 3 day(s).   Why:  To be seen with repeat labs (CBC, BMP, PT & INR).  Please adjust Coumadin dose as needed.  Please forward these lab results to patient's [facility] Coumadin clinic.       Wayne Cedars, MD. Schedule an appointment as soon as possible for a visit in 4 week(s).   Specialty:  Orthopedic Surgery Contact information: [address] [place] [address] [phone]        Wayne Mocha, MD. Schedule an appointment as soon as possible for a visit.   Specialty:  Cardiology Contact information: [address] [place] [address] [phone]            Contact information for after-discharge care    Destination    [facility] Preferred SNF .   Service:  Skilled Nursing Contact information: [place] [phone]                 Allergies  Allergen Reactions  . Penicillins Rash and Other (See Comments)  PATIENT HAS HAD A PCN REACTION WITH IMMEDIATE RASH, FACIAL/TONGUE/THROAT SWELLING, SOB, OR LIGHTHEADEDNESS WITH HYPOTENSION:  #  #  #  YES  #  #  #   Has patient had a PCN reaction causing severe rash involving mucus membranes or skin necrosis: No Has patient had a PCN reaction that required hospitalization No Has patient had a PCN reaction occurring within the last 10 years: No If all of the above answers are "NO", then may proceed with Cephalosporin use.  . Prednisone Other (See Comments)    Wheezing  . Procaine Hcl Other (See Comments)    Passed out after being given this at dental appointment  . Levofloxacin Rash  . Oxycodone Other (See Comments)    constipation      Procedures/Studies: Ct Abdomen Pelvis Wo Contrast  Result Date: 01/21/2018 CLINICAL DATA:  Status post fall backwards, with left pelvic tenderness. Initial encounter. EXAM: CT ABDOMEN AND PELVIS WITHOUT CONTRAST TECHNIQUE: Multidetector CT imaging of the abdomen and pelvis was performed following the standard protocol without IV contrast. COMPARISON:  CTA of the abdomen and pelvis performed 08/06/2017 FINDINGS: Lower chest: Small bilateral pleural effusions are noted, with bibasilar atelectasis. A valve replacement is noted at the aortic valve. Diffuse coronary artery calcifications are seen. Hepatobiliary: The liver is unremarkable in appearance. The gallbladder is unremarkable in appearance. The common bile duct remains normal in caliber. Pancreas: The pancreas is within normal limits. Spleen: The spleen is unremarkable in appearance. Adrenals/Urinary  Tract: The adrenal glands are unremarkable in appearance. A small left renal cyst is noted. Nonspecific perinephric stranding is noted bilaterally. There is no evidence of hydronephrosis. No renal or ureteral stones are identified. Stomach/Bowel: The stomach is unremarkable in appearance. The small bowel is within normal limits. The appendix is normal in caliber, without evidence of appendicitis. The colon is unremarkable in appearance. Vascular/Lymphatic: Diffuse calcification is seen along the abdominal aorta and its branches. The abdominal aorta is otherwise grossly unremarkable. The inferior vena cava is grossly unremarkable. No retroperitoneal lymphadenopathy is seen. No pelvic sidewall lymphadenopathy is identified. Reproductive: The bladder is mildly distended, with a left-sided diverticulum. The prostate is normal in size. Other: Soft tissue hemorrhage is noted along the space of Retzius, anterior to the bladder, and tracking along the left pelvic sidewall, reflecting the overlying left pubic rami fractures. There is also diffuse soft tissue hemorrhage along the left pectineus and adductor musculature. Musculoskeletal: There are mildly displaced and mildly comminuted fractures of the left superior and inferior pubic rami. No additional fractures are seen. There is mild chronic loss of height at vertebral body L3. IMPRESSION: 1. Mildly displaced and mildly comminuted fractures of the left superior and inferior pubic rami. 2. Soft tissue hemorrhage along the space of Retzius, anterior to the bladder, and tracking along the left pelvic sidewall, reflecting the overlying left pubic rami fractures. 3. Diffuse soft tissue hemorrhage along the left pectineus and adductor musculature. 4. Small bilateral pleural effusions, with bibasilar atelectasis. 5. Diffuse coronary artery calcifications seen. 6. Small left renal cyst noted. Aortic Atherosclerosis (ICD10-I70.0). These results were called by telephone at the time  of interpretation on 01/21/2018 at 10:58 pm to Dr. Corrie Dandy , who verbally acknowledged these results. Electronically Signed   By: Garald Balding M.D.   On: 01/21/2018 23:00   Ct Head Wo Contrast  Result Date: 01/21/2018 CLINICAL DATA:  Fall EXAM: CT HEAD WITHOUT CONTRAST CT CERVICAL SPINE WITHOUT CONTRAST TECHNIQUE: Multidetector CT imaging of the head and cervical  spine was performed following the standard protocol without intravenous contrast. Multiplanar CT image reconstructions of the cervical spine were also generated. COMPARISON:  05/21/2017 FINDINGS: CT HEAD FINDINGS Brain: Global atrophy. There are mild chronic ischemic changes in the periventricular white matter. There is no mass effect, midline shift, or acute hemorrhage. Vascular: No hyperdense vessel or unexpected calcification. Skull: Cranium is intact. Sinuses/Orbits: Mastoid air cells are clear. Visualized paranasal sinuses are clear. Orbits are within normal limits. Other: Noncontributory CT CERVICAL SPINE FINDINGS Alignment: There is anatomic alignment. Skull base and vertebrae: There is no vertebral compression deformity. No obvious fracture. No dislocation. Soft tissues and spinal canal: No obvious spinal canal hematoma. There is no obvious soft tissue hematoma in the neck. Thyroid is unremarkable. Atherosclerotic calcifications of the carotids bilaterally. Disc levels: There are bridging anterior osteophytes throughout the cervical spine. There is also ankylosis of the posterior elements in the upper cervical spine. Left-sided uncovertebral osteophytes cause some left foraminal narrowing at C3-4. There are prominent posterior disc osteophytes at C4-5. This results in an element of central spinal stenosis. Left paracentral and foraminal osteophytes at C6-7 result in severe left foraminal narrowing. Upper chest: There is hazy ground-glass in the visualized left upper lobe. Other: Noncontributory IMPRESSION: No acute intracranial pathology.  Chronic ischemic changes are noted. No evidence of cervical spine injury. Degenerative changes are noted above. Hazy ground-glass opacity in the left upper lobe. Electronically Signed   By: Marybelle Killings M.D.   On: 01/21/2018 20:02   Ct Cervical Spine Wo Contrast  Result Date: 01/21/2018 CLINICAL DATA:  Fall EXAM: CT HEAD WITHOUT CONTRAST CT CERVICAL SPINE WITHOUT CONTRAST TECHNIQUE: Multidetector CT imaging of the head and cervical spine was performed following the standard protocol without intravenous contrast. Multiplanar CT image reconstructions of the cervical spine were also generated. COMPARISON:  05/21/2017 FINDINGS: CT HEAD FINDINGS Brain: Global atrophy. There are mild chronic ischemic changes in the periventricular white matter. There is no mass effect, midline shift, or acute hemorrhage. Vascular: No hyperdense vessel or unexpected calcification. Skull: Cranium is intact. Sinuses/Orbits: Mastoid air cells are clear. Visualized paranasal sinuses are clear. Orbits are within normal limits. Other: Noncontributory CT CERVICAL SPINE FINDINGS Alignment: There is anatomic alignment. Skull base and vertebrae: There is no vertebral compression deformity. No obvious fracture. No dislocation. Soft tissues and spinal canal: No obvious spinal canal hematoma. There is no obvious soft tissue hematoma in the neck. Thyroid is unremarkable. Atherosclerotic calcifications of the carotids bilaterally. Disc levels: There are bridging anterior osteophytes throughout the cervical spine. There is also ankylosis of the posterior elements in the upper cervical spine. Left-sided uncovertebral osteophytes cause some left foraminal narrowing at C3-4. There are prominent posterior disc osteophytes at C4-5. This results in an element of central spinal stenosis. Left paracentral and foraminal osteophytes at C6-7 result in severe left foraminal narrowing. Upper chest: There is hazy ground-glass in the visualized left upper lobe.  Other: Noncontributory IMPRESSION: No acute intracranial pathology. Chronic ischemic changes are noted. No evidence of cervical spine injury. Degenerative changes are noted above. Hazy ground-glass opacity in the left upper lobe. Electronically Signed   By: Marybelle Killings M.D.   On: 01/21/2018 20:02   Dg Pelvis Portable  Result Date: 01/21/2018 CLINICAL DATA:  84 year old male with history of trauma from a fall. Pain in the left hip. EXAM: PORTABLE PELVIS 1-2 VIEWS COMPARISON:  Left hip radiograph 09/24/2017. FINDINGS: Two portable views of the pelvis demonstrate what appears to be subtle discontinuity of the left  superior and inferior pubic rami, concerning for minimally displaced fractures. Bilateral proximal femurs as visualized appear intact and the femoral heads appear located on these frontal projections. IMPRESSION: 1. Probable fractures of the left superior and inferior pubic rami, poorly evaluated on today's portable frontal views. These could be confirmed with noncontrast CT the pelvis if clinically appropriate. Electronically Signed   By: Vinnie Langton M.D.   On: 01/21/2018 19:40   Dg Chest Portable 1 View  Result Date: 01/21/2018 CLINICAL DATA:  Fall EXAM: PORTABLE CHEST 1 VIEW COMPARISON:  09/10/2017 FINDINGS: The heart remains moderately enlarged. Aortic valve replacement hardware is stable in position. Normal vascularity. No pneumothorax or pleural effusion. There is no evidence of consolidation. IMPRESSION: Cardiomegaly without decompensation. Electronically Signed   By: Marybelle Killings M.D.   On: 01/21/2018 19:39   Dg Shoulder Left Port  Result Date: 01/27/2018 CLINICAL DATA:  Left shoulder pain. EXAM: LEFT SHOULDER - 1 VIEW COMPARISON:  None. FINDINGS: There is no evidence of acute fracture or dislocation. Moderate proliferative disease is seen involving the acromion. The [facility] joint shows normal alignment. No bony lesions or destruction. Soft tissues are unremarkable. IMPRESSION: Degenerative  proliferative disease involving the acromion. Electronically Signed   By: Aletta Edouard M.D.   On: 01/27/2018 08:57      Subjective: Patient reports improved pain control with tramadol which was confirmed by nursing.  Patient denies any other complaints.  Tolerating diet and having BMs.  No family at bedside this morning.  Discharge Exam:  Vitals:   01/27/18 0547 01/27/18 1113 01/27/18 2035 01/28/18 0544  BP: 116/69 (!) 108/59 114/65 130/69  Pulse: 95 83 92 78  Resp: 18 18 16 20   Temp: (!) 97.5 F (36.4 C) (!) 97.4 F (36.3 C) 98.3 F (36.8 C) 98.2 F (36.8 C)  TempSrc: Oral Oral Oral Oral  SpO2: 95% 97% 96% 97%  Weight: 78.5 kg   80.4 kg  Height:        General appearance: Pleasant elderly male, moderately built and thinly nourished lying comfortably propped up in bed and does not appear in painful distress. Cardiac: S1 and S2 heard, irregularly irregular.  No JVD, murmurs or pedal edema.  Respiratory: Clear to auscultation.  No increased work of breathing.   Abdomen: Abdomen is nondistended, soft and nontender.  No organomegaly or masses appreciated.  Normal bowel sounds heard.   MSK: No deformities or effusions, but he guards the left leg, also right leg movement is somewhat limited but these have improved compared to couple days ago.  No new findings/stable.  Bruising of right upper extremity which his wife indicates has been present even prior to fall.  Patient has bilateral TED hose. Neuro: Alert this morning and oriented x2.  No focal neurological deficits.  Improved and stable. Psych: Pleasant and appropriate.  Judgment and insight impaired.    The results of significant diagnostics from this hospitalization (including imaging, microbiology, ancillary and laboratory) are listed below for reference.     Microbiology: No results found for this or any previous visit (from the past 240 hour(s)).   Labs: CBC: Recent Labs  Lab 01/21/18 1949  01/23/18 0457  01/24/18 0425 01/25/18 0542 01/26/18 0421 01/27/18 0527  WBC 8.4   < > 9.0 8.6 8.5 7.5 7.9  NEUTROABS 6.5  --   --   --   --   --   --   HGB 12.2*   < > 11.0* 11.0* 11.2* 10.9* 11.7*  HCT 38.6*   < >  33.9* 34.3* 34.9* 33.4* 35.8*  MCV 94.1   < > 93.4 94.5 93.6 92.3 92.0  PLT 151   < > 126* 128* 143* 157 163   < > = values in this interval not displayed.   Basic Metabolic Panel: Recent Labs  Lab 01/21/18 1949 01/22/18 0635 01/23/18 0457 01/24/18 0425  NA 141 140 137 137  K 4.1 4.1 4.1 4.1  CL 102 104 102 101  CO2 32 26 26 27   GLUCOSE 107* 125* 110* 98  BUN 17 15 15 14   CREATININE 1.23 0.94 0.92 0.80  CALCIUM 9.0 8.5* 8.4* 8.4*   Liver Function Tests: Recent Labs  Lab 01/21/18 1949  AST 24  ALT 14  ALKPHOS 39  BILITOT 1.0  PROT 6.6  ALBUMIN 3.7   BNP (last 3 results) Recent Labs    07/02/17 1632 08/31/17 1427 01/22/18 0213  BNP 442.5* 343.3* 271.8*   Cardiac Enzymes: Recent Labs  Lab 01/22/18 0213  CKTOTAL 163   CBG: Recent Labs  Lab 01/23/18 1246  GLUCAP 91       Time coordinating discharge: 40 minutes  SIGNED:  Vernell Leep, MD, FACP, [facility]. [facility] Pager [phone]  If 7PM-7AM, please contact night-coverage [email or web address] Password [email or web address] 01/28/2018, 11:02 AM

## 2018-01-28 NOTE — Progress Notes (Signed)
Patient assisted into chair with 2 assist and walker.  Patient did have some pain, however able to take steps on his own with the walker to the chair.  BP sitting on side of bed was 119/81 and then when he stood up, BP 109/65 standing.

## 2018-02-03 DIAGNOSIS — R2689 Other abnormalities of gait and mobility: Secondary | ICD-10-CM | POA: Diagnosis not present

## 2018-02-03 DIAGNOSIS — I2510 Atherosclerotic heart disease of native coronary artery without angina pectoris: Secondary | ICD-10-CM | POA: Diagnosis not present

## 2018-02-03 DIAGNOSIS — I482 Chronic atrial fibrillation: Secondary | ICD-10-CM | POA: Diagnosis not present

## 2018-02-03 DIAGNOSIS — R630 Anorexia: Secondary | ICD-10-CM | POA: Diagnosis not present

## 2018-02-03 DIAGNOSIS — S32592D Other specified fracture of left pubis, subsequent encounter for fracture with routine healing: Secondary | ICD-10-CM | POA: Diagnosis not present

## 2018-02-26 ENCOUNTER — Ambulatory Visit: Payer: Medicare HMO | Admitting: Family Medicine

## 2018-02-26 DIAGNOSIS — M25561 Pain in right knee: Secondary | ICD-10-CM | POA: Insufficient documentation

## 2018-02-26 DIAGNOSIS — M25562 Pain in left knee: Secondary | ICD-10-CM | POA: Insufficient documentation

## 2018-02-26 DIAGNOSIS — S32599A Other specified fracture of unspecified pubis, initial encounter for closed fracture: Secondary | ICD-10-CM | POA: Diagnosis not present

## 2018-02-28 ENCOUNTER — Telehealth: Payer: Self-pay | Admitting: Cardiology

## 2018-02-28 DIAGNOSIS — M791 Myalgia, unspecified site: Secondary | ICD-10-CM | POA: Diagnosis not present

## 2018-02-28 DIAGNOSIS — S32502D Unspecified fracture of left pubis, subsequent encounter for fracture with routine healing: Secondary | ICD-10-CM | POA: Diagnosis not present

## 2018-02-28 DIAGNOSIS — L89610 Pressure ulcer of right heel, unstageable: Secondary | ICD-10-CM | POA: Diagnosis not present

## 2018-02-28 DIAGNOSIS — K5900 Constipation, unspecified: Secondary | ICD-10-CM | POA: Diagnosis not present

## 2018-02-28 DIAGNOSIS — I110 Hypertensive heart disease with heart failure: Secondary | ICD-10-CM | POA: Diagnosis not present

## 2018-02-28 DIAGNOSIS — H04123 Dry eye syndrome of bilateral lacrimal glands: Secondary | ICD-10-CM | POA: Diagnosis not present

## 2018-02-28 DIAGNOSIS — I872 Venous insufficiency (chronic) (peripheral): Secondary | ICD-10-CM | POA: Diagnosis not present

## 2018-02-28 DIAGNOSIS — I5032 Chronic diastolic (congestive) heart failure: Secondary | ICD-10-CM | POA: Diagnosis not present

## 2018-02-28 DIAGNOSIS — I951 Orthostatic hypotension: Secondary | ICD-10-CM | POA: Diagnosis not present

## 2018-02-28 DIAGNOSIS — I2510 Atherosclerotic heart disease of native coronary artery without angina pectoris: Secondary | ICD-10-CM | POA: Diagnosis not present

## 2018-02-28 LAB — POCT INR: INR: 1.8 — AB (ref 2.0–3.0)

## 2018-02-28 NOTE — Telephone Encounter (Signed)
New Message:   The Patient Daughter calling about a pro time check. Please call Daughter back. [facility] would like to do it if we can not.

## 2018-02-28 NOTE — Telephone Encounter (Signed)
Follow up    Patient's daughter states that need to contact [facility] with the order. The contact number is [phone]. [name] is the contact person at [employer]. [place] is coming to home at 1:00 pm.

## 2018-02-28 NOTE — Telephone Encounter (Signed)
Spoke with [facility] - they will have INR drawn today with visit.

## 2018-03-01 ENCOUNTER — Ambulatory Visit (INDEPENDENT_AMBULATORY_CARE_PROVIDER_SITE_OTHER): Payer: Medicare HMO | Admitting: Pharmacist

## 2018-03-01 DIAGNOSIS — I482 Chronic atrial fibrillation, unspecified: Secondary | ICD-10-CM

## 2018-03-01 DIAGNOSIS — Z5181 Encounter for therapeutic drug level monitoring: Secondary | ICD-10-CM | POA: Diagnosis not present

## 2018-03-04 ENCOUNTER — Ambulatory Visit (INDEPENDENT_AMBULATORY_CARE_PROVIDER_SITE_OTHER): Payer: Medicare HMO | Admitting: Family Medicine

## 2018-03-04 ENCOUNTER — Encounter: Payer: Self-pay | Admitting: Family Medicine

## 2018-03-04 VITALS — BP 124/78 | HR 105 | Ht 71.0 in | Wt 161.0 lb

## 2018-03-04 DIAGNOSIS — I10 Essential (primary) hypertension: Secondary | ICD-10-CM | POA: Diagnosis not present

## 2018-03-04 DIAGNOSIS — I951 Orthostatic hypotension: Secondary | ICD-10-CM | POA: Diagnosis not present

## 2018-03-04 DIAGNOSIS — H04123 Dry eye syndrome of bilateral lacrimal glands: Secondary | ICD-10-CM | POA: Diagnosis not present

## 2018-03-04 DIAGNOSIS — I5032 Chronic diastolic (congestive) heart failure: Secondary | ICD-10-CM | POA: Diagnosis not present

## 2018-03-04 DIAGNOSIS — S32592S Other specified fracture of left pubis, sequela: Secondary | ICD-10-CM | POA: Diagnosis not present

## 2018-03-04 DIAGNOSIS — I482 Chronic atrial fibrillation, unspecified: Secondary | ICD-10-CM

## 2018-03-04 DIAGNOSIS — K5900 Constipation, unspecified: Secondary | ICD-10-CM

## 2018-03-04 DIAGNOSIS — W19XXXD Unspecified fall, subsequent encounter: Secondary | ICD-10-CM

## 2018-03-04 DIAGNOSIS — S32502D Unspecified fracture of left pubis, subsequent encounter for fracture with routine healing: Secondary | ICD-10-CM | POA: Diagnosis not present

## 2018-03-04 DIAGNOSIS — I872 Venous insufficiency (chronic) (peripheral): Secondary | ICD-10-CM | POA: Diagnosis not present

## 2018-03-04 DIAGNOSIS — L89610 Pressure ulcer of right heel, unstageable: Secondary | ICD-10-CM | POA: Diagnosis not present

## 2018-03-04 DIAGNOSIS — I110 Hypertensive heart disease with heart failure: Secondary | ICD-10-CM | POA: Diagnosis not present

## 2018-03-04 DIAGNOSIS — M791 Myalgia, unspecified site: Secondary | ICD-10-CM | POA: Diagnosis not present

## 2018-03-04 DIAGNOSIS — I2510 Atherosclerotic heart disease of native coronary artery without angina pectoris: Secondary | ICD-10-CM | POA: Diagnosis not present

## 2018-03-04 NOTE — Progress Notes (Signed)
Impression and Recommendations:    1. Fall, subsequent encounter   2. Closed fracture of left inferior pubic ramus, sequela   3. Constipation, unspecified constipation type   4. Chronic atrial fibrillation (HCC)   5. Essential hypertension     Hospital Follow up -Encouraged pt's daughter to have someone stay with him for home therapy in order to better advocate for care -Educated pt on [place] requirements for chronic pain treatment and that PCPs cannot prescribe for chronic pain -Discussed role of Dr. Veverly Fells in long term pain care until the pelvic fracture heals -Educated pt about negative side affects of narcotics, including fatigue  -Encouraged pt and daughter to discuss continuing pain with Dr. Veverly Fells if pain is not tolerale  Feet -Educated pt on increase of swelling due to healing from pelvic fracture  -Encouraged pt to wear compression socks to help with swelling -Discussed role of gravity in swelling of feet following lower body injuries -Instructed pt to avoid pressure on his bed sore including avoiding wearing shoes around his home -Instructed pt to place calf on a pillow to remove pressure from foot at night -Encouraged pt to continue wearing non-slip socks while moving around the house  Memory -Explained that high stress situations can negatively impact memory  -Discussed negative impact of constant changes on memory with advanced age -Explained that pt consistently scores well on memory tests during medicaid wellness appointments -Explained that sleep disruption can also have negative impacts on memory    Gross side effects, risk and benefits, and alternatives of medications and treatment plan in general discussed with patient.  Patient is aware that all medications have potential side effects and we are unable to predict every side effect or drug-drug interaction that may occur.   Patient will call with any questions prior to using medication if they have concerns.   Expresses verbal understanding and consents to current therapy and treatment regimen.  No barriers to understanding were identified.  Red flag symptoms and signs discussed in detail.  Patient expressed understanding regarding what to do in case of emergency\urgent symptoms  Please see AVS handed out to patient at the end of our visit for further patient instructions/ counseling done pertaining to today's office visit.   Return for Follow-up for chronic care 4 months or so; sooner if concerns.    Note:  This note was prepared with assistance of Dragon voice recognition software. Occasional wrong-word or sound-a-like substitutions may have occurred due to the inherent limitations of voice recognition software.    I have reviewed the above documentation for accuracy and completeness, and I agree with the above.    This document serves as a record of services personally performed by [name], MD. It was created on her behalf by Georga Bora, a trained medical scribe. The creation of this record is based on the scribe's personal observations and the provider's statements to them.   I have reviewed the above medical documentation for accuracy and completeness and I concur.  [name] 03/05/18 1:41 PM   ------------------------------------------------------------------------------------------------------------------------------------------------------------------------------------------------------------------------------------------   Subjective:     HPI: Wayne [name] is a 84 y.o. male who presents to [place] at [facility] today for follow-up after  -Daughter Bary Castilla is present and acting as historian unless otherwise noted  Hospital Follow up -Regarding pt's recent hospitalization and/or ED visit: reviewed in great detail recent hospitalization notes, clinical lab tests, tests in the radiology section of CPT, tests in the  medicine testing of  CPT, and obtained history from family member. -Independent visualization of image, tracing, or specimen itself done by me today.   -Pt is back to moving around assisted by a walker, is using the bathroom, and is gradually healing from injury -Takes Miralax every night to help with chronic constipation; has recovered from tramadol based constipation -Met with Dr. Veverly Fells last week in orthopedics -Pt received two cortisone injections in his knees, and x-ray of pelvic fracture -Stated pelvic fracture is healing well and it will probably be another two weeks before he's "back to normal"  -Pt stayed for 4 weeks at [facility]; was released on 7 September -Stayed with ihs daughter for the last week, her home is 1 story -Have installed a stair lift to assist with stairs in his home  -Pt needs PCP signature on orders requested by physical and occupational therapy -Family had requested outpatient therapy, and daughter states the doctor hasn't done any actual exercises -States with medicare he can not have both nurse care at home and outpatient therapy -Has some questions about care because she feels it's not helping  -Pt has a prescription for tramadol but experienced disorientation, sleeping very late, and serious constipation at 28m dosage -Daughter states at 267mthe medication wasn't helping with pain management, so he discontinued taking the tramadol -Refuses to take any pain medication aside form tylenol pm -Pt states the pain is at a 5 and is constant; brought a pillow into appointment to sit on  Feet -Pt requested evaluation of a bed sore on his right foot -States it hurts whenever pressure is on it or its touching something  -Pt has also been experiencing some swelling of his feet that he is concerned with, largely in left foot -Pt has been wearing compression socks to alleviate some swelling  Memory -Daughter has concerns about short term memory issues -States that while  he was on tramadol, he couldn't remember "even what he ate for lunch that day" -Pt states he doesn't feel his memory has had negative changes within last two weeks (pt left rhab facility 10 days ago)  Wt Readings from Last 3 Encounters:  03/04/18 161 lb (73 kg)  01/28/18 177 lb 4 oz (80.4 kg)  12/31/17 176 lb (79.8 kg)   BP Readings from Last 3 Encounters:  03/04/18 124/78  01/28/18 116/73  12/31/17 108/63   Pulse Readings from Last 3 Encounters:  03/04/18 (!) 105  01/28/18 99  12/31/17 82   BMI Readings from Last 3 Encounters:  03/04/18 22.45 kg/m  01/28/18 25.07 kg/m  12/31/17 24.90 kg/m     Patient Care Team    Relationship Specialty Notifications Start End  OpMellody DanceDO PCP - General Family Medicine  10/23/16   CrLelon PerlaMD PCP - Cardiology Cardiology Admissions 07/06/17   JoJarome MatinMD Consulting Physician Dermatology  10/23/16   OtKathie RhodesMD Consulting Physician Urology  10/23/16   GiLatanya MaudlinMD Consulting Physician Orthopedic Surgery  11/16/16    Comment: back pain     Patient Active Problem List   Diagnosis Date Noted  . Vitamin D deficiency 11/16/2016    Priority: High  . H/O non anemic vitamin B12 deficiency 11/16/2016    Priority: High  . Chronic Colonic dysmotility 11/16/2016    Priority: High  . Chronic atrial fibrillation 09/13/2010    Priority: High  . Chronic anticoagulation     Priority: Medium  . HTN (hypertension)  Priority: Medium  . CAD (coronary artery disease)     Priority: Medium  . h/o CVA (cerebrovascular accident due to intracerebral hemorrhage) 09/13/2010    Priority: Medium  . BPH with obstruction/lower urinary tract symptoms 09/13/2016    Priority: Low  . Pain in left knee 02/26/2018  . Pain in right knee 02/26/2018  . Fall 01/21/2018  . Closed fracture of left inferior pubic ramus ([place]) 01/21/2018  . Constipation 12/19/2017  .  hernia of linea alba 12/19/2017  . Osteoarthritis of hip 09/24/2017    . Gastroenteritis 09/10/2017  . S/P TAVR (transcatheter aortic valve replacement) 09/04/2017  . Severe mitral regurgitation   . Insomnia 07/10/2017  . High risk medication use 07/10/2017  . Long term current use of anticoagulant 07/10/2017  . Chronic diastolic CHF (congestive heart failure) ([place]) 05/20/2017  . Hyponatremia 05/20/2017  . Severe aortic stenosis 05/20/2017  . Urinary retention   . 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 Spinal stenosis at L4-L5 level 04/26/2017  . Lumbar radiculopathy, chronic 04/26/2017  . Benign colonic polyp- 25 yrs ago.  11/16/2016  . Hypokalemia 09/13/2016  . Urinary frequency 07/10/2016  . Left bundle branch block (LBBB) on electrocardiogram 06/30/2016  . History of stroke 06/30/2016  . Generalized weakness 06/30/2016  . Asthenia 06/30/2016  . Osteoarthritis 04/04/2016  . Encounter for therapeutic drug monitoring 08/06/2013    Past Medical history, Surgical history, Family history, Social history, Allergies and Medications have been entered into the medical record, reviewed and changed as needed.    Current Meds  Medication Sig  . acetaminophen (TYLENOL) 500 MG tablet Take 1,000 mg by mouth every 6 (six) hours as needed for moderate pain or headache.  . alfuzosin (UROXATRAL) 10 MG 24 hr tablet Take 10 mg by mouth daily with breakfast.  . ARTIFICIAL TEAR OP Place 1 drop into both eyes daily as needed (dry eyes).  . cholecalciferol 2000 units TABS Take 1 tablet (2,000 Units total) by mouth daily.  . digoxin (LANOXIN) 0.125 MG tablet Take 1 tablet (125 mcg total) by mouth daily.  Marland Kitchen docusate sodium (COLACE) 100 MG capsule Take 100 mg by mouth every evening.  . finasteride (PROSCAR) 5 MG tablet Take 5 mg by mouth at bedtime.   . furosemide (LASIX) 40 MG tablet Take 0.5 tablets (20 mg total) by mouth 2 (two) times daily as needed for fluid.  . Melatonin 1 MG TABS Take 5 mg by mouth at bedtime.   . Menthol, Topical  Analgesic, (BIOFREEZE EX) Apply 1 application topically 3 (three) times daily as needed (muscle pain in back). alterrnates between [employer] and Blu Emu  . Menthol, Topical Analgesic, (BLUE-EMU MAXIMUM STRENGTH EX) Apply 1 application topically 3 (three) times daily as needed (muscle pain in back). alterrnates between [employer] and [employer]  . metoprolol tartrate (LOPRESSOR) 25 MG tablet Take 0.5 tablets (12.5 mg total) by mouth 2 (two) times daily.  . polyethylene glycol (MIRALAX / GLYCOLAX) packet Take 17 g by mouth 2 (two) times daily.  . potassium chloride (K-DUR,KLOR-CON) 10 MEQ tablet Take 1 tablet (10 mEq total) by mouth daily.  Marland Kitchen senna (SENOKOT) 8.6 MG TABS tablet Take 2 tablets (17.2 mg total) by mouth daily.  . sodium chloride (OCEAN) 0.65 % SOLN nasal spray Place 1 spray into both nostrils daily as needed for congestion.  . traMADol (ULTRAM) 50 MG tablet Take 1 tablet (50 mg total) by mouth every 6 (six) hours as needed for  severe pain.  Marland Kitchen triamcinolone cream (KENALOG) 0.1 % Apply 1 application topically daily as needed (dry skin).   Marland Kitchen warfarin (COUMADIN) 5 MG tablet Take 1 tablet (5 mg total) by mouth daily at 6 PM.    Allergies:  Allergies  Allergen Reactions  . Penicillins Rash and Other (See Comments)    PATIENT HAS HAD A PCN REACTION WITH IMMEDIATE RASH, FACIAL/TONGUE/THROAT SWELLING, SOB, OR LIGHTHEADEDNESS WITH HYPOTENSION:  #  #  #  YES  #  #  #   Has patient had a PCN reaction causing severe rash involving mucus membranes or skin necrosis: No Has patient had a PCN reaction that required hospitalization No Has patient had a PCN reaction occurring within the last 10 years: No If all of the above answers are "NO", then may proceed with Cephalosporin use.  . Prednisone Other (See Comments)    Wheezing  . Procaine Hcl Other (See Comments)    Passed out after being given this at dental appointment  . Levofloxacin Rash  . Oxycodone Other (See Comments)    constipation      Review of Systems:  A fourteen system review of systems was performed and found to be positive as per HPI.   Objective:   Blood pressure 124/78, pulse (!) 105, height _0  (1.803 m), weight 161 lb (73 kg), SpO2 97 %. Body mass index is 22.45 kg/m. General:  Well Developed, well nourished, appropriate for stated age.  Neuro:  Alert and oriented,  extra-ocular muscles intact  HEENT:  Normocephalic, atraumatic, neck supple, no carotid bruits appreciated  Skin:  no gross rash, warm, pink. Cardiac:  RRR, S1 S2 Respiratory:  ECTA B/L and A/P, Not using accessory muscles, speaking in full sentences- unlabored. Vascular:  Ext warm, no cyanosis apprec.; cap RF less 2 sec. Psych:  No HI/SI, judgement and insight good, Euthymic mood. Full Affect.

## 2018-03-05 DIAGNOSIS — Z85828 Personal history of other malignant neoplasm of skin: Secondary | ICD-10-CM | POA: Diagnosis not present

## 2018-03-05 DIAGNOSIS — D485 Neoplasm of uncertain behavior of skin: Secondary | ICD-10-CM | POA: Diagnosis not present

## 2018-03-05 DIAGNOSIS — L570 Actinic keratosis: Secondary | ICD-10-CM | POA: Diagnosis not present

## 2018-03-05 DIAGNOSIS — D0422 Carcinoma in situ of skin of left ear and external auricular canal: Secondary | ICD-10-CM | POA: Diagnosis not present

## 2018-03-05 DIAGNOSIS — D692 Other nonthrombocytopenic purpura: Secondary | ICD-10-CM | POA: Diagnosis not present

## 2018-03-05 DIAGNOSIS — L821 Other seborrheic keratosis: Secondary | ICD-10-CM | POA: Diagnosis not present

## 2018-03-07 DIAGNOSIS — L89610 Pressure ulcer of right heel, unstageable: Secondary | ICD-10-CM | POA: Diagnosis not present

## 2018-03-07 DIAGNOSIS — I951 Orthostatic hypotension: Secondary | ICD-10-CM | POA: Diagnosis not present

## 2018-03-07 DIAGNOSIS — I872 Venous insufficiency (chronic) (peripheral): Secondary | ICD-10-CM | POA: Diagnosis not present

## 2018-03-07 DIAGNOSIS — K5900 Constipation, unspecified: Secondary | ICD-10-CM | POA: Diagnosis not present

## 2018-03-07 DIAGNOSIS — S32502D Unspecified fracture of left pubis, subsequent encounter for fracture with routine healing: Secondary | ICD-10-CM | POA: Diagnosis not present

## 2018-03-07 DIAGNOSIS — I2510 Atherosclerotic heart disease of native coronary artery without angina pectoris: Secondary | ICD-10-CM | POA: Diagnosis not present

## 2018-03-07 DIAGNOSIS — M791 Myalgia, unspecified site: Secondary | ICD-10-CM | POA: Diagnosis not present

## 2018-03-07 DIAGNOSIS — H04123 Dry eye syndrome of bilateral lacrimal glands: Secondary | ICD-10-CM | POA: Diagnosis not present

## 2018-03-07 DIAGNOSIS — I110 Hypertensive heart disease with heart failure: Secondary | ICD-10-CM | POA: Diagnosis not present

## 2018-03-07 DIAGNOSIS — I5032 Chronic diastolic (congestive) heart failure: Secondary | ICD-10-CM | POA: Diagnosis not present

## 2018-03-07 LAB — POCT INR: INR: 1.9 — AB (ref 2.0–3.0)

## 2018-03-08 ENCOUNTER — Ambulatory Visit (INDEPENDENT_AMBULATORY_CARE_PROVIDER_SITE_OTHER): Payer: Medicare HMO | Admitting: Pharmacist Clinician (PhC)/ Clinical Pharmacy Specialist

## 2018-03-08 DIAGNOSIS — Z5181 Encounter for therapeutic drug level monitoring: Secondary | ICD-10-CM | POA: Diagnosis not present

## 2018-03-08 DIAGNOSIS — I482 Chronic atrial fibrillation, unspecified: Secondary | ICD-10-CM

## 2018-03-11 DIAGNOSIS — I5032 Chronic diastolic (congestive) heart failure: Secondary | ICD-10-CM | POA: Diagnosis not present

## 2018-03-11 DIAGNOSIS — H04123 Dry eye syndrome of bilateral lacrimal glands: Secondary | ICD-10-CM | POA: Diagnosis not present

## 2018-03-11 DIAGNOSIS — I110 Hypertensive heart disease with heart failure: Secondary | ICD-10-CM | POA: Diagnosis not present

## 2018-03-11 DIAGNOSIS — I2510 Atherosclerotic heart disease of native coronary artery without angina pectoris: Secondary | ICD-10-CM | POA: Diagnosis not present

## 2018-03-11 DIAGNOSIS — K5900 Constipation, unspecified: Secondary | ICD-10-CM | POA: Diagnosis not present

## 2018-03-11 DIAGNOSIS — M791 Myalgia, unspecified site: Secondary | ICD-10-CM | POA: Diagnosis not present

## 2018-03-11 DIAGNOSIS — I872 Venous insufficiency (chronic) (peripheral): Secondary | ICD-10-CM | POA: Diagnosis not present

## 2018-03-11 DIAGNOSIS — I951 Orthostatic hypotension: Secondary | ICD-10-CM | POA: Diagnosis not present

## 2018-03-11 DIAGNOSIS — S32502D Unspecified fracture of left pubis, subsequent encounter for fracture with routine healing: Secondary | ICD-10-CM | POA: Diagnosis not present

## 2018-03-11 DIAGNOSIS — L89610 Pressure ulcer of right heel, unstageable: Secondary | ICD-10-CM | POA: Diagnosis not present

## 2018-03-13 DIAGNOSIS — H04123 Dry eye syndrome of bilateral lacrimal glands: Secondary | ICD-10-CM | POA: Diagnosis not present

## 2018-03-13 DIAGNOSIS — I5032 Chronic diastolic (congestive) heart failure: Secondary | ICD-10-CM | POA: Diagnosis not present

## 2018-03-13 DIAGNOSIS — I872 Venous insufficiency (chronic) (peripheral): Secondary | ICD-10-CM | POA: Diagnosis not present

## 2018-03-13 DIAGNOSIS — K5900 Constipation, unspecified: Secondary | ICD-10-CM | POA: Diagnosis not present

## 2018-03-13 DIAGNOSIS — L89610 Pressure ulcer of right heel, unstageable: Secondary | ICD-10-CM | POA: Diagnosis not present

## 2018-03-13 DIAGNOSIS — I110 Hypertensive heart disease with heart failure: Secondary | ICD-10-CM | POA: Diagnosis not present

## 2018-03-13 DIAGNOSIS — M791 Myalgia, unspecified site: Secondary | ICD-10-CM | POA: Diagnosis not present

## 2018-03-13 DIAGNOSIS — I2510 Atherosclerotic heart disease of native coronary artery without angina pectoris: Secondary | ICD-10-CM | POA: Diagnosis not present

## 2018-03-13 DIAGNOSIS — S32502D Unspecified fracture of left pubis, subsequent encounter for fracture with routine healing: Secondary | ICD-10-CM | POA: Diagnosis not present

## 2018-03-13 DIAGNOSIS — I951 Orthostatic hypotension: Secondary | ICD-10-CM | POA: Diagnosis not present

## 2018-03-15 ENCOUNTER — Ambulatory Visit (INDEPENDENT_AMBULATORY_CARE_PROVIDER_SITE_OTHER): Payer: Medicare HMO | Admitting: Pharmacist Clinician (PhC)/ Clinical Pharmacy Specialist

## 2018-03-15 DIAGNOSIS — I110 Hypertensive heart disease with heart failure: Secondary | ICD-10-CM | POA: Diagnosis not present

## 2018-03-15 DIAGNOSIS — K5900 Constipation, unspecified: Secondary | ICD-10-CM | POA: Diagnosis not present

## 2018-03-15 DIAGNOSIS — L89610 Pressure ulcer of right heel, unstageable: Secondary | ICD-10-CM | POA: Diagnosis not present

## 2018-03-15 DIAGNOSIS — I482 Chronic atrial fibrillation, unspecified: Secondary | ICD-10-CM

## 2018-03-15 DIAGNOSIS — H04123 Dry eye syndrome of bilateral lacrimal glands: Secondary | ICD-10-CM | POA: Diagnosis not present

## 2018-03-15 DIAGNOSIS — S32502D Unspecified fracture of left pubis, subsequent encounter for fracture with routine healing: Secondary | ICD-10-CM | POA: Diagnosis not present

## 2018-03-15 DIAGNOSIS — I951 Orthostatic hypotension: Secondary | ICD-10-CM | POA: Diagnosis not present

## 2018-03-15 DIAGNOSIS — M791 Myalgia, unspecified site: Secondary | ICD-10-CM | POA: Diagnosis not present

## 2018-03-15 DIAGNOSIS — Z5181 Encounter for therapeutic drug level monitoring: Secondary | ICD-10-CM

## 2018-03-15 DIAGNOSIS — I2510 Atherosclerotic heart disease of native coronary artery without angina pectoris: Secondary | ICD-10-CM | POA: Diagnosis not present

## 2018-03-15 DIAGNOSIS — I872 Venous insufficiency (chronic) (peripheral): Secondary | ICD-10-CM | POA: Diagnosis not present

## 2018-03-15 DIAGNOSIS — I5032 Chronic diastolic (congestive) heart failure: Secondary | ICD-10-CM | POA: Diagnosis not present

## 2018-03-15 LAB — POCT INR: INR: 1.9 — AB (ref 2.0–3.0)

## 2018-03-18 DIAGNOSIS — I2510 Atherosclerotic heart disease of native coronary artery without angina pectoris: Secondary | ICD-10-CM | POA: Diagnosis not present

## 2018-03-18 DIAGNOSIS — H04123 Dry eye syndrome of bilateral lacrimal glands: Secondary | ICD-10-CM | POA: Diagnosis not present

## 2018-03-18 DIAGNOSIS — I872 Venous insufficiency (chronic) (peripheral): Secondary | ICD-10-CM | POA: Diagnosis not present

## 2018-03-18 DIAGNOSIS — I951 Orthostatic hypotension: Secondary | ICD-10-CM | POA: Diagnosis not present

## 2018-03-18 DIAGNOSIS — I5032 Chronic diastolic (congestive) heart failure: Secondary | ICD-10-CM | POA: Diagnosis not present

## 2018-03-18 DIAGNOSIS — I110 Hypertensive heart disease with heart failure: Secondary | ICD-10-CM | POA: Diagnosis not present

## 2018-03-18 DIAGNOSIS — K5900 Constipation, unspecified: Secondary | ICD-10-CM | POA: Diagnosis not present

## 2018-03-18 DIAGNOSIS — S32502D Unspecified fracture of left pubis, subsequent encounter for fracture with routine healing: Secondary | ICD-10-CM | POA: Diagnosis not present

## 2018-03-18 DIAGNOSIS — M791 Myalgia, unspecified site: Secondary | ICD-10-CM | POA: Diagnosis not present

## 2018-03-18 DIAGNOSIS — L89610 Pressure ulcer of right heel, unstageable: Secondary | ICD-10-CM | POA: Diagnosis not present

## 2018-03-21 DIAGNOSIS — I872 Venous insufficiency (chronic) (peripheral): Secondary | ICD-10-CM | POA: Diagnosis not present

## 2018-03-21 DIAGNOSIS — L89610 Pressure ulcer of right heel, unstageable: Secondary | ICD-10-CM | POA: Diagnosis not present

## 2018-03-21 DIAGNOSIS — M791 Myalgia, unspecified site: Secondary | ICD-10-CM | POA: Diagnosis not present

## 2018-03-21 DIAGNOSIS — I2510 Atherosclerotic heart disease of native coronary artery without angina pectoris: Secondary | ICD-10-CM | POA: Diagnosis not present

## 2018-03-21 DIAGNOSIS — H04123 Dry eye syndrome of bilateral lacrimal glands: Secondary | ICD-10-CM | POA: Diagnosis not present

## 2018-03-21 DIAGNOSIS — I951 Orthostatic hypotension: Secondary | ICD-10-CM | POA: Diagnosis not present

## 2018-03-21 DIAGNOSIS — K5900 Constipation, unspecified: Secondary | ICD-10-CM | POA: Diagnosis not present

## 2018-03-21 DIAGNOSIS — I110 Hypertensive heart disease with heart failure: Secondary | ICD-10-CM | POA: Diagnosis not present

## 2018-03-21 DIAGNOSIS — I5032 Chronic diastolic (congestive) heart failure: Secondary | ICD-10-CM | POA: Diagnosis not present

## 2018-03-21 DIAGNOSIS — S32502D Unspecified fracture of left pubis, subsequent encounter for fracture with routine healing: Secondary | ICD-10-CM | POA: Diagnosis not present

## 2018-03-22 ENCOUNTER — Ambulatory Visit (INDEPENDENT_AMBULATORY_CARE_PROVIDER_SITE_OTHER): Payer: Medicare HMO | Admitting: Pharmacist

## 2018-03-22 DIAGNOSIS — K5900 Constipation, unspecified: Secondary | ICD-10-CM | POA: Diagnosis not present

## 2018-03-22 DIAGNOSIS — I5032 Chronic diastolic (congestive) heart failure: Secondary | ICD-10-CM | POA: Diagnosis not present

## 2018-03-22 DIAGNOSIS — I872 Venous insufficiency (chronic) (peripheral): Secondary | ICD-10-CM | POA: Diagnosis not present

## 2018-03-22 DIAGNOSIS — I110 Hypertensive heart disease with heart failure: Secondary | ICD-10-CM | POA: Diagnosis not present

## 2018-03-22 DIAGNOSIS — S32502D Unspecified fracture of left pubis, subsequent encounter for fracture with routine healing: Secondary | ICD-10-CM | POA: Diagnosis not present

## 2018-03-22 DIAGNOSIS — I2510 Atherosclerotic heart disease of native coronary artery without angina pectoris: Secondary | ICD-10-CM | POA: Diagnosis not present

## 2018-03-22 DIAGNOSIS — Z5181 Encounter for therapeutic drug level monitoring: Secondary | ICD-10-CM | POA: Diagnosis not present

## 2018-03-22 DIAGNOSIS — I951 Orthostatic hypotension: Secondary | ICD-10-CM | POA: Diagnosis not present

## 2018-03-22 DIAGNOSIS — I482 Chronic atrial fibrillation, unspecified: Secondary | ICD-10-CM | POA: Diagnosis not present

## 2018-03-22 DIAGNOSIS — M791 Myalgia, unspecified site: Secondary | ICD-10-CM | POA: Diagnosis not present

## 2018-03-22 DIAGNOSIS — H04123 Dry eye syndrome of bilateral lacrimal glands: Secondary | ICD-10-CM | POA: Diagnosis not present

## 2018-03-22 DIAGNOSIS — L89610 Pressure ulcer of right heel, unstageable: Secondary | ICD-10-CM | POA: Diagnosis not present

## 2018-03-22 LAB — POCT INR: INR: 2.3 (ref 2.0–3.0)

## 2018-04-02 ENCOUNTER — Other Ambulatory Visit: Payer: Self-pay | Admitting: Cardiology

## 2018-04-03 NOTE — Telephone Encounter (Signed)
Rx request sent to pharmacy.  

## 2018-04-05 ENCOUNTER — Ambulatory Visit (INDEPENDENT_AMBULATORY_CARE_PROVIDER_SITE_OTHER): Payer: Medicare HMO | Admitting: Pharmacist Clinician (PhC)/ Clinical Pharmacy Specialist

## 2018-04-05 DIAGNOSIS — Z5181 Encounter for therapeutic drug level monitoring: Secondary | ICD-10-CM | POA: Diagnosis not present

## 2018-04-05 DIAGNOSIS — I482 Chronic atrial fibrillation, unspecified: Secondary | ICD-10-CM | POA: Diagnosis not present

## 2018-04-05 LAB — POCT INR: INR: 3 (ref 2.0–3.0)

## 2018-04-16 ENCOUNTER — Other Ambulatory Visit: Payer: Self-pay | Admitting: Cardiology

## 2018-04-19 ENCOUNTER — Telehealth: Payer: Self-pay | Admitting: Family Medicine

## 2018-04-19 NOTE — Telephone Encounter (Signed)
Called and notified the patient that Dr. Raliegh Scarlet recommends him to go to an urgent care or medcenter. Patient expressed understanding MPulliam, CMA/RT(R)

## 2018-04-19 NOTE — Telephone Encounter (Signed)
Spoke to the patient's wife and she states that the patient had pain in abd last night, but pain has improved.  Patient wanted an appointment with Dr. Raliegh Scarlet today. Explained to the wife that we only see patient until 11 on Friday and that we did not have anything open this morning. She asked if we had after hours appointments, I explained to her that we do not.  She then requested to talk to Dr. Raliegh Scarlet on the phone, I advised her that she was in with her last patient and asked her to give me details of what is going on with the patient.  She then requested that Dr. Raliegh Scarlet call them, that would like to discuss with her what to do over the phone. I asked her for more details again to provide Dr. Raliegh Scarlet and that abd pain would need to be eval in the office to determine the best course of action.  Patient's wife then said thank you and hung up the phone.

## 2018-04-19 NOTE — Telephone Encounter (Signed)
Patient called says stomach swollen to size of a Orange last nite & with pain & request provider appt-- Advised pt unavailable office appts and nurse would call him at 1st available moment @ [phone]  ---Forwarding request to medical asst.  --glh

## 2018-04-21 ENCOUNTER — Encounter (HOSPITAL_COMMUNITY): Payer: Self-pay | Admitting: Emergency Medicine

## 2018-04-21 ENCOUNTER — Emergency Department (HOSPITAL_COMMUNITY): Payer: Medicare HMO

## 2018-04-21 ENCOUNTER — Inpatient Hospital Stay (HOSPITAL_COMMUNITY)
Admission: EM | Admit: 2018-04-21 | Discharge: 2018-04-26 | DRG: 394 | Disposition: A | Discharge status: Home Health | Payer: Medicare HMO | Attending: Family Medicine | Admitting: Family Medicine

## 2018-04-21 ENCOUNTER — Ambulatory Visit (HOSPITAL_COMMUNITY)
Admission: EM | Admit: 2018-04-21 | Discharge: 2018-04-21 | Disposition: A | Discharge status: Home / Self Care | Payer: Medicare HMO

## 2018-04-21 DIAGNOSIS — Z79891 Long term (current) use of opiate analgesic: Secondary | ICD-10-CM

## 2018-04-21 DIAGNOSIS — R350 Frequency of micturition: Secondary | ICD-10-CM | POA: Diagnosis present

## 2018-04-21 DIAGNOSIS — I110 Hypertensive heart disease with heart failure: Secondary | ICD-10-CM | POA: Diagnosis not present

## 2018-04-21 DIAGNOSIS — K598 Other specified functional intestinal disorders: Secondary | ICD-10-CM | POA: Diagnosis present

## 2018-04-21 DIAGNOSIS — I252 Old myocardial infarction: Secondary | ICD-10-CM

## 2018-04-21 DIAGNOSIS — R413 Other amnesia: Secondary | ICD-10-CM | POA: Diagnosis not present

## 2018-04-21 DIAGNOSIS — D649 Anemia, unspecified: Secondary | ICD-10-CM

## 2018-04-21 DIAGNOSIS — G4700 Insomnia, unspecified: Secondary | ICD-10-CM | POA: Diagnosis present

## 2018-04-21 DIAGNOSIS — I5032 Chronic diastolic (congestive) heart failure: Secondary | ICD-10-CM | POA: Diagnosis present

## 2018-04-21 DIAGNOSIS — R0600 Dyspnea, unspecified: Secondary | ICD-10-CM | POA: Diagnosis not present

## 2018-04-21 DIAGNOSIS — Z66 Do not resuscitate: Secondary | ICD-10-CM | POA: Diagnosis present

## 2018-04-21 DIAGNOSIS — E876 Hypokalemia: Secondary | ICD-10-CM | POA: Diagnosis present

## 2018-04-21 DIAGNOSIS — K3580 Unspecified acute appendicitis: Secondary | ICD-10-CM | POA: Diagnosis not present

## 2018-04-21 DIAGNOSIS — Z9181 History of falling: Secondary | ICD-10-CM

## 2018-04-21 DIAGNOSIS — R109 Unspecified abdominal pain: Secondary | ICD-10-CM | POA: Diagnosis not present

## 2018-04-21 DIAGNOSIS — Z888 Allergy status to other drugs, medicaments and biological substances status: Secondary | ICD-10-CM | POA: Diagnosis not present

## 2018-04-21 DIAGNOSIS — I2510 Atherosclerotic heart disease of native coronary artery without angina pectoris: Secondary | ICD-10-CM | POA: Diagnosis not present

## 2018-04-21 DIAGNOSIS — Z880 Allergy status to penicillin: Secondary | ICD-10-CM | POA: Diagnosis not present

## 2018-04-21 DIAGNOSIS — Z79899 Other long term (current) drug therapy: Secondary | ICD-10-CM

## 2018-04-21 DIAGNOSIS — Z953 Presence of xenogenic heart valve: Secondary | ICD-10-CM

## 2018-04-21 DIAGNOSIS — I482 Chronic atrial fibrillation, unspecified: Secondary | ICD-10-CM | POA: Diagnosis present

## 2018-04-21 DIAGNOSIS — E559 Vitamin D deficiency, unspecified: Secondary | ICD-10-CM | POA: Diagnosis present

## 2018-04-21 DIAGNOSIS — Z823 Family history of stroke: Secondary | ICD-10-CM | POA: Diagnosis not present

## 2018-04-21 DIAGNOSIS — K5909 Other constipation: Secondary | ICD-10-CM | POA: Diagnosis not present

## 2018-04-21 DIAGNOSIS — K3532 Acute appendicitis with perforation and localized peritonitis, without abscess: Secondary | ICD-10-CM | POA: Diagnosis present

## 2018-04-21 DIAGNOSIS — Z8249 Family history of ischemic heart disease and other diseases of the circulatory system: Secondary | ICD-10-CM

## 2018-04-21 DIAGNOSIS — N401 Enlarged prostate with lower urinary tract symptoms: Secondary | ICD-10-CM | POA: Diagnosis not present

## 2018-04-21 DIAGNOSIS — Z885 Allergy status to narcotic agent status: Secondary | ICD-10-CM | POA: Diagnosis not present

## 2018-04-21 DIAGNOSIS — R05 Cough: Secondary | ICD-10-CM | POA: Diagnosis not present

## 2018-04-21 DIAGNOSIS — Z7901 Long term (current) use of anticoagulants: Secondary | ICD-10-CM

## 2018-04-21 DIAGNOSIS — J849 Interstitial pulmonary disease, unspecified: Secondary | ICD-10-CM | POA: Diagnosis not present

## 2018-04-21 DIAGNOSIS — M48061 Spinal stenosis, lumbar region without neurogenic claudication: Secondary | ICD-10-CM | POA: Diagnosis present

## 2018-04-21 DIAGNOSIS — Z8719 Personal history of other diseases of the digestive system: Secondary | ICD-10-CM

## 2018-04-21 DIAGNOSIS — Z8673 Personal history of transient ischemic attack (TIA), and cerebral infarction without residual deficits: Secondary | ICD-10-CM

## 2018-04-21 DIAGNOSIS — Z9861 Coronary angioplasty status: Secondary | ICD-10-CM

## 2018-04-21 HISTORY — DX: Anxiety disorder, unspecified: F41.9

## 2018-04-21 HISTORY — DX: Inflammatory liver disease, unspecified: K75.9

## 2018-04-21 HISTORY — DX: Pneumonia, unspecified organism: J18.9

## 2018-04-21 HISTORY — DX: Cardiac murmur, unspecified: R01.1

## 2018-04-21 LAB — LIPASE, BLOOD: Lipase: 35 U/L (ref 11–51)

## 2018-04-21 LAB — COMPREHENSIVE METABOLIC PANEL
ALBUMIN: 3.4 g/dL — AB (ref 3.5–5.0)
ALK PHOS: 49 U/L (ref 38–126)
ALT: 10 U/L (ref 0–44)
AST: 18 U/L (ref 15–41)
Anion gap: 8 (ref 5–15)
BUN: 12 mg/dL (ref 8–23)
CALCIUM: 8.6 mg/dL — AB (ref 8.9–10.3)
CO2: 28 mmol/L (ref 22–32)
CREATININE: 1.06 mg/dL (ref 0.61–1.24)
Chloride: 102 mmol/L (ref 98–111)
GFR calc Af Amer: >60 mL/min (ref >60)
GFR calc non Af Amer: >60 mL/min (ref >60)
GLUCOSE: 157 mg/dL — AB (ref 70–99)
Potassium: 3.5 mmol/L (ref 3.5–5.1)
SODIUM: 138 mmol/L (ref 135–145)
Total Bilirubin: 0.8 mg/dL (ref 0.3–1.2)
Total Protein: 6.1 g/dL — ABNORMAL LOW (ref 6.5–8.1)

## 2018-04-21 LAB — CBC
HCT: 38 % — ABNORMAL LOW (ref 39.0–52.0)
Hemoglobin: 12 g/dL — ABNORMAL LOW (ref 13.0–17.0)
MCH: 29.4 pg (ref 26.0–34.0)
MCHC: 31.6 g/dL (ref 30.0–36.0)
MCV: 93.1 fL (ref 80.0–100.0)
PLATELETS: 147 10*3/uL — AB (ref 150–400)
RBC: 4.08 MIL/uL — AB (ref 4.22–5.81)
RDW: 16.2 % — AB (ref 11.5–15.5)
WBC: 7.3 10*3/uL (ref 4.0–10.5)
nRBC: 0 % (ref 0.0–0.2)

## 2018-04-21 LAB — URINALYSIS, ROUTINE W REFLEX MICROSCOPIC
Bilirubin Urine: NEGATIVE
GLUCOSE, UA: NEGATIVE mg/dL
HGB URINE DIPSTICK: NEGATIVE
Ketones, ur: NEGATIVE mg/dL
Leukocytes, UA: NEGATIVE
Nitrite: NEGATIVE
PROTEIN: NEGATIVE mg/dL
Specific Gravity, Urine: 1.024 (ref 1.005–1.030)
pH: 5 (ref 5.0–8.0)

## 2018-04-21 LAB — PROTIME-INR
INR: 2.27
Prothrombin Time: 24.8 seconds — ABNORMAL HIGH (ref 11.4–15.2)

## 2018-04-21 MED ORDER — DIGOXIN 125 MCG PO TABS
125.0000 ug | ORAL_TABLET | Freq: Every day | ORAL | Status: DC
  Administered 2018-04-22 – 2018-04-26 (×5): 125 ug via ORAL
  Filled 2018-04-21 (×5): qty 1

## 2018-04-21 MED ORDER — SENNA 8.6 MG PO TABS
2.0000 | ORAL_TABLET | Freq: Every day | ORAL | Status: DC
  Administered 2018-04-21 – 2018-04-26 (×6): 17.2 mg via ORAL
  Filled 2018-04-21 (×6): qty 2

## 2018-04-21 MED ORDER — METRONIDAZOLE IN NACL 5-0.79 MG/ML-% IV SOLN
500.0000 mg | Freq: Three times a day (TID) | INTRAVENOUS | Status: DC
  Administered 2018-04-21 – 2018-04-25 (×11): 500 mg via INTRAVENOUS
  Filled 2018-04-21 (×12): qty 100

## 2018-04-21 MED ORDER — SODIUM CHLORIDE 0.9 % IV SOLN
2.0000 g | Freq: Once | INTRAVENOUS | Status: AC
  Administered 2018-04-21: 2 g via INTRAVENOUS
  Filled 2018-04-21 (×2): qty 2

## 2018-04-21 MED ORDER — METRONIDAZOLE IN NACL 5-0.79 MG/ML-% IV SOLN
500.0000 mg | Freq: Once | INTRAVENOUS | Status: DC
  Filled 2018-04-21: qty 100

## 2018-04-21 MED ORDER — MELATONIN 3 MG PO TABS
6.0000 mg | ORAL_TABLET | Freq: Every day | ORAL | Status: DC
  Administered 2018-04-21 – 2018-04-25 (×4): 6 mg via ORAL
  Filled 2018-04-21 (×5): qty 2

## 2018-04-21 MED ORDER — METOPROLOL TARTRATE 25 MG PO TABS
12.5000 mg | ORAL_TABLET | Freq: Two times a day (BID) | ORAL | Status: DC
  Administered 2018-04-21 – 2018-04-26 (×10): 12.5 mg via ORAL
  Filled 2018-04-21 (×10): qty 1

## 2018-04-21 MED ORDER — FINASTERIDE 5 MG PO TABS
5.0000 mg | ORAL_TABLET | Freq: Every day | ORAL | Status: DC
  Administered 2018-04-21 – 2018-04-25 (×5): 5 mg via ORAL
  Filled 2018-04-21 (×5): qty 1

## 2018-04-21 MED ORDER — ACETAMINOPHEN 500 MG PO TABS
1000.0000 mg | ORAL_TABLET | Freq: Four times a day (QID) | ORAL | Status: DC | PRN
  Administered 2018-04-22 – 2018-04-23 (×3): 1000 mg via ORAL
  Filled 2018-04-21 (×3): qty 2

## 2018-04-21 MED ORDER — DOCUSATE SODIUM 100 MG PO CAPS
100.0000 mg | ORAL_CAPSULE | Freq: Every evening | ORAL | Status: DC
  Administered 2018-04-21 – 2018-04-25 (×3): 100 mg via ORAL
  Filled 2018-04-21 (×3): qty 1

## 2018-04-21 MED ORDER — POLYETHYLENE GLYCOL 3350 17 G PO PACK
17.0000 g | PACK | Freq: Two times a day (BID) | ORAL | Status: DC
  Administered 2018-04-21 – 2018-04-26 (×7): 17 g via ORAL
  Filled 2018-04-21 (×8): qty 1

## 2018-04-21 MED ORDER — ALFUZOSIN HCL ER 10 MG PO TB24
10.0000 mg | ORAL_TABLET | Freq: Every day | ORAL | Status: DC
  Administered 2018-04-22 – 2018-04-26 (×5): 10 mg via ORAL
  Filled 2018-04-21 (×5): qty 1

## 2018-04-21 MED ORDER — VITAMIN D 1000 UNITS PO TABS
2000.0000 [IU] | ORAL_TABLET | Freq: Every day | ORAL | Status: DC
  Administered 2018-04-22 – 2018-04-26 (×5): 2000 [IU] via ORAL
  Filled 2018-04-21 (×3): qty 2

## 2018-04-21 MED ORDER — VITAMIN K1 10 MG/ML IJ SOLN
10.0000 mg | Freq: Once | INTRAVENOUS | Status: AC
  Administered 2018-04-21: 10 mg via INTRAVENOUS
  Filled 2018-04-21: qty 1

## 2018-04-21 MED ORDER — IOHEXOL 300 MG/ML  SOLN
100.0000 mL | Freq: Once | INTRAMUSCULAR | Status: AC | PRN
  Administered 2018-04-21: 100 mL via INTRAVENOUS

## 2018-04-21 MED ORDER — SODIUM CHLORIDE 0.9 % IV SOLN
INTRAVENOUS | Status: DC
  Administered 2018-04-21 – 2018-04-24 (×4): via INTRAVENOUS

## 2018-04-21 MED ORDER — SODIUM CHLORIDE 0.9 % IV SOLN
2.0000 g | Freq: Two times a day (BID) | INTRAVENOUS | Status: DC
  Administered 2018-04-21 – 2018-04-24 (×7): 2 g via INTRAVENOUS
  Filled 2018-04-21 (×8): qty 2

## 2018-04-21 MED ORDER — HEPARIN SODIUM (PORCINE) 5000 UNIT/ML IJ SOLN
5000.0000 [IU] | Freq: Three times a day (TID) | INTRAMUSCULAR | Status: DC
  Administered 2018-04-21 – 2018-04-22 (×2): 5000 [IU] via SUBCUTANEOUS
  Filled 2018-04-21 (×2): qty 1

## 2018-04-21 MED ORDER — FUROSEMIDE 20 MG PO TABS
20.0000 mg | ORAL_TABLET | Freq: Two times a day (BID) | ORAL | Status: DC | PRN
  Administered 2018-04-24: 20 mg via ORAL
  Filled 2018-04-21: qty 1

## 2018-04-21 NOTE — Progress Notes (Signed)
Pharmacy Antibiotic Note  Smith Potenza is a 84 y.o. male admitted on 04/21/2018 with appendicitis without abscess or perforation. Pharmacy has been consulted for cefepime dosing, patient also receiving metronidazole dosed appropriately per MD. WBC WNL, AF. Scr 1.06, estimated CrCl ~53 mL/min.  Plan: Start cefepime 2g IV q12h Continue metronidazole 500mg  IV q8h F/u clinical status, renal function, surgery plans, LOT  Height: 5' 10.5" (179.1 cm) Weight: 165 lb (74.8 kg) IBW/kg (Calculated) : 74.15  Temp (24hrs), Avg:97.9 F (36.6 C), Min:97.9 F (36.6 C), Max:97.9 F (36.6 C)  Recent Labs  Lab 04/21/18 1213  WBC 7.3  CREATININE 1.06    Estimated Creatinine Clearance: 52.5 mL/min (by C-G formula based on SCr of 1.06 mg/dL).    Allergies  Allergen Reactions  . Penicillins Rash and Other (See Comments)    PATIENT HAS HAD A PCN REACTION WITH IMMEDIATE RASH, FACIAL/TONGUE/THROAT SWELLING, SOB, OR LIGHTHEADEDNESS WITH HYPOTENSION:  #  #  #  YES  #  #  #   Has patient had a PCN reaction causing severe rash involving mucus membranes or skin necrosis: No Has patient had a PCN reaction that required hospitalization No Has patient had a PCN reaction occurring within the last 10 years: No If all of the above answers are "NO", then may proceed with Cephalosporin use.  . Prednisone Other (See Comments)    Wheezing  . Procaine Hcl Other (See Comments)    Passed out after being given this at dental appointment  . Levofloxacin Rash  . Oxycodone Other (See Comments)    constipation   Antimicrobials this admission: Cefepime 11/3 >> Metronidazole 11/3 >>  Microbiology results: None  Thank you for allowing pharmacy to be a part of this patient's care.  Mila Merry Gerarda Fraction, PharmD PGY2 Infectious Diseases Pharmacy Resident Phone: [phone] 04/21/2018 3:57 PM

## 2018-04-21 NOTE — ED Triage Notes (Addendum)
Pt states he had pain to his abdomen with swelling that started Thursday night. States the pain wen to the right LQ. Denies lumps or hernias. No N/V/D. Denies pain with urination. Pt states the pain is worse with movement.

## 2018-04-21 NOTE — Consult Note (Signed)
Reason for Consult:abd pain Referring Physician: Dr Kristeen Mans Autry Prust is an 84 y.o. male.  HPI: 89 yom who has abdominal pain. Three days ago noted umbilical swelling and this has gone down but has lower abdominal discomfort mostly in right side now. No urinary symptoms outside of longstanding bph.  Worse with movement. No fevers, no emesis, no change in bowel habits.  Has colonoscopy before he turned 74.  Underwent ct scan that shows acute appendicitis and I was asked to see him Has significant cardiac history, dnr, on coumadin with inr of 2.24 His wife is here with him He had a fall earlier this year with rami fx but doing better.  Past Medical History:  Diagnosis Date  . Aortic stenosis   . Arthritis   . Atrial fibrillation (HCC)    Chronic  . Atrial fibrillation, chronic   . BPH (benign prostatic hyperplasia)   . CAD (coronary artery disease)   . Chronic anticoagulation   . History of chicken pox   . HTN (hypertension)   . Mitral stenosis   . Old MI (myocardial infarction) 2007   s/p PCI to distal OM (no stent)  . Severe mitral regurgitation   . 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 CORONARY ANGIOPLASTY  2007   Distal OM  . PROSTATE BIOPSY    . RIGHT/LEFT HEART CATH AND CORONARY ANGIOGRAPHY N/A 08/01/2017   Procedure: RIGHT/LEFT HEART CATH AND CORONARY ANGIOGRAPHY;  Surgeon: Sherren Mocha, MD;  Location: [place] [facility];  Service: Cardiovascular;  Laterality: N/A;  . TEE WITHOUT CARDIOVERSION N/A 07/20/2017   Procedure: TRANSESOPHAGEAL ECHOCARDIOGRAM (TEE);  Surgeon: Lelon Perla, MD;  Location: [facility] ENDOSCOPY;  Service: Cardiovascular;  Laterality: N/A;  . TEE WITHOUT CARDIOVERSION N/A 09/04/2017   Procedure: TRANSESOPHAGEAL ECHOCARDIOGRAM (TEE);  Surgeon: Sherren Mocha, MD;  Location: [place];  Service: Open Heart Surgery;  Laterality: N/A;  . TRANSCATHETER AORTIC VALVE  REPLACEMENT, TRANSFEMORAL N/A 09/04/2017   Procedure: TRANSCATHETER AORTIC VALVE REPLACEMENT, TRANSFEMORAL;  Surgeon: Sherren Mocha, MD;  Location: [place];  Service: Open Heart Surgery;  Laterality: N/A;    Family History  Problem Relation Age of Onset  . Heart disease Mother   . Heart attack Mother   . Heart disease Father   . Heart attack Father   . Heart attack Brother   . Heart attack Sister   . Stroke Brother     Social History:  reports that he has never smoked. He has never used smokeless tobacco. He reports that he does not drink alcohol or use drugs.  Allergies:  Allergies  Allergen Reactions  . Penicillins Rash and Other (See Comments)    PATIENT HAS HAD A PCN REACTION WITH IMMEDIATE RASH, FACIAL/TONGUE/THROAT SWELLING, SOB, OR LIGHTHEADEDNESS WITH HYPOTENSION:  #  #  #  YES  #  #  #   Has patient had a PCN reaction causing severe rash involving mucus membranes or skin necrosis: No Has patient had a PCN reaction that required hospitalization No Has patient had a PCN reaction occurring within the last 10 years: No If all of the above answers are "NO", then may proceed with Cephalosporin use.  . Prednisone Other (See Comments)    Wheezing  . Procaine Hcl Other (See Comments)    Passed out after being given this at dental appointment  . Levofloxacin Rash  . Oxycodone Other (See Comments)  constipation    Medications: reviewed  Results for orders placed or performed during the hospital encounter of 04/21/18 (from the past 48 hour(s))  Lipase, blood     Status: None   Collection Time: 04/21/18 12:13 PM  Result Value Ref Range   Lipase 35 11 - 51 U/L    Comment: Performed at [facility], [address], [place], [place] [address]  Comprehensive metabolic panel     Status: Abnormal   Collection Time: 04/21/18 12:13 PM  Result Value Ref Range   Sodium 138 135 - 145 mmol/L   Potassium 3.5 3.5 - 5.1 mmol/L   Chloride 102 98 - 111 mmol/L   CO2 28 22 - 32  mmol/L   Glucose, Bld 157 (H) 70 - 99 mg/dL   BUN 12 8 - 23 mg/dL   Creatinine, Ser 1.06 0.61 - 1.24 mg/dL   Calcium 8.6 (L) 8.9 - 10.3 mg/dL   Total Protein 6.1 (L) 6.5 - 8.1 g/dL   Albumin 3.4 (L) 3.5 - 5.0 g/dL   AST 18 15 - 41 U/L   ALT 10 0 - 44 U/L   Alkaline Phosphatase 49 38 - 126 U/L   Total Bilirubin 0.8 0.3 - 1.2 mg/dL   GFR calc non Af Amer >60 >60 mL/min   GFR calc Af Amer >60 >60 mL/min    Comment: (NOTE) The eGFR has been calculated using the CKD EPI equation. This calculation has not been validated in all clinical situations. eGFR's persistently <60 mL/min signify possible Chronic Kidney Disease.    Anion gap 8 5 - 15    Comment: Performed at [facility] [address], [place], [place] [address]  CBC     Status: Abnormal   Collection Time: 04/21/18 12:13 PM  Result Value Ref Range   WBC 7.3 4.0 - 10.5 K/uL   RBC 4.08 (L) 4.22 - 5.81 MIL/uL   Hemoglobin 12.0 (L) 13.0 - 17.0 g/dL   HCT 38.0 (L) 39.0 - 52.0 %   MCV 93.1 80.0 - 100.0 fL   MCH 29.4 26.0 - 34.0 pg   MCHC 31.6 30.0 - 36.0 g/dL   RDW 16.2 (H) 11.5 - 15.5 %   Platelets 147 (L) 150 - 400 K/uL   nRBC 0.0 0.0 - 0.2 %    Comment: Performed at [facility] [address], [place], [place] [address]  Urinalysis, Routine w reflex microscopic     Status: None   Collection Time: 04/21/18 12:15 PM  Result Value Ref Range   Color, Urine YELLOW YELLOW   APPearance CLEAR CLEAR   Specific Gravity, Urine 1.024 1.005 - 1.030   pH 5.0 5.0 - 8.0   Glucose, UA NEGATIVE NEGATIVE mg/dL   Hgb urine dipstick NEGATIVE NEGATIVE   Bilirubin Urine NEGATIVE NEGATIVE   Ketones, ur NEGATIVE NEGATIVE mg/dL   Protein, ur NEGATIVE NEGATIVE mg/dL   Nitrite NEGATIVE NEGATIVE   Leukocytes, UA NEGATIVE NEGATIVE    Comment: Performed at [facility] [address], [place], [place] [address]  Protime-INR     Status: Abnormal   Collection Time: 04/21/18  1:35 PM  Result Value Ref Range   Prothrombin Time  24.8 (H) 11.4 - 15.2 seconds   INR 2.27     Comment: Performed at [facility] [address] [place], [place] [address]    Ct Abdomen Pelvis W Contrast  Result Date: 04/21/2018 CLINICAL DATA:  Abdominal pain. EXAM:  CT ABDOMEN AND PELVIS WITH CONTRAST TECHNIQUE: Multidetector CT imaging of the abdomen and pelvis was performed using the standard protocol following bolus administration of intravenous contrast. CONTRAST:  158m OMNIPAQUE IOHEXOL 300 MG/ML  SOLN COMPARISON:  CT abdomen pelvis dated March 23, 2018. FINDINGS: Lower chest: Small right and trace left pleural effusions, slightly decreased when compared to prior study. Bilateral lower lobe subsegmental atelectasis. Hepatobiliary: No focal liver abnormality is seen. No gallstones, gallbladder wall thickening, or biliary dilatation. Pancreas: Unremarkable. No pancreatic ductal dilatation or surrounding inflammatory changes. Spleen: Normal in size without focal abnormality. Adrenals/Urinary Tract: The adrenal glands are unremarkable. Unchanged left renal simple cyst. No renal or ureteral calculi. No hydronephrosis. Unchanged left posterolateral bladder diverticulum. No bladder wall thickening. Stomach/Bowel: Dilatation of the distal appendix with prominent distal periappendiceal inflammatory changes, consistent with acute appendicitis. Appendix: Location: Right lower quadrant, inferior to the cecum. Diameter: 11 mm. Appendicolith: None. Mucosal hyper-enhancement: Mild. Extraluminal gas: None. Periappendiceal collection: Small phlegmonous change adjacent to the tip of the appendix without discrete fluid collection. The stomach, small bowel, and colon are unremarkable. No obstruction. Vascular/Lymphatic: Aortic atherosclerosis. No enlarged abdominal or pelvic lymph nodes. Reproductive: Stable prostatomegaly. Other: No free fluid or pneumoperitoneum. Left pelvic hematoma has resolved. Musculoskeletal: No acute or significant osseous findings.  Healing left superior and inferior pubic rami fractures. Chronic L3 compression deformity. IMPRESSION: 1. Acute appendicitis. Small phlegmonous change adjacent to the tip may reflect microperforation. No extraluminal gas or discrete periappendiceal fluid collection. 2. Slightly decreased small right trace left pleural effusions. 3. Healing left superior and inferior pubic rami fractures. Resolved left pelvic hematoma. 4.  Aortic atherosclerosis (ICD10-I70.0). Electronically Signed   By: WTitus DubinM.D.   On: 04/21/2018 14:33    Review of Systems  Constitutional: Negative for chills and fever.  Cardiovascular: Negative for chest pain.  Gastrointestinal: Positive for abdominal pain.  Genitourinary: Positive for frequency.   Blood pressure 104/66, pulse 77, temperature 97.9 F (36.6 C), temperature source Oral, resp. rate 18, height 5' 10.5" (1.791 m), weight 74.8 kg, SpO2 98 %. Physical Exam  Vitals reviewed. Constitutional: He is oriented to person, place, and time. He appears well-developed and well-nourished.  HENT:  Head: Normocephalic and atraumatic.  Right Ear: External ear normal.  Left Ear: External ear normal.  Eyes: No scleral icterus.  Neck: Neck supple.  Cardiovascular: Normal rate.  Respiratory: Effort normal.  GI: Soft. Bowel sounds are normal. He exhibits no distension. There is tenderness in the right lower quadrant and left lower quadrant. A hernia (small reducible umbilical hernia nontender) is present.  Neurological: He is alert and oriented to person, place, and time.  Skin: Skin is warm and dry.  Psychiatric: His behavior is normal.    Assessment/Plan: Acute appendicitis He and his wife do not really want surgery. I agree with them if we can manage conservatively with abx alone might be better for quality of life. There certainly is some risk to surgery that by geriatric cardiac risk index could be over 25%.  I think reasonable to give him some vitamin k and let  inr come down.  If he improves over next48 hours then could send home on oral abx.  They understand there is recurrence/failure risk with recurrent appendicitis but I think this is less than his operative risk. If he does not improve or worsen then plan for surgery. They also understand there is small riskof missed tumor.  Will follow with medical team. Appreciate their help in this complex  patient   Rolm Bookbinder 04/21/2018, 3:28 PM

## 2018-04-21 NOTE — ED Notes (Signed)
Pt reports starting last night with swelling in abd "like an orange".  Swelling no longer as delineated, but c/o continued abd swelling with significant RLQ pain, especially with movement.  Discussed with H. Wieters, Zuehl - informed pt & spouse to go to ED for further eval.  Both verbalized understanding.

## 2018-04-21 NOTE — ED Notes (Signed)
Patient transported to CT 

## 2018-04-21 NOTE — ED Notes (Signed)
ED Provider at bedside. 

## 2018-04-21 NOTE — ED Provider Notes (Signed)
[facility] EMERGENCY DEPARTMENT Provider Note   CSN: [id number] Arrival date & time: 04/21/18  1134     History   Chief Complaint Chief Complaint  Patient presents with  . Abdominal Pain  . Groin Pain    HPI Wayne [name] is a 84 y.o. male.  HPI  84 year old male presents with abdominal pain and swelling.  He states that 3 days ago he noticed his umbilicus swelled like an orange.  That has gone down but he has had some residual discomfort and puffiness to his lower abdomen.  Some discomfort to his right sided abdomen.  No urinary symptoms.  He has chronic BPH and difficulty urinating but this is not new.  He is also has some low back pain during this time of abdominal pain.  The pain is not significant currently while talking to him but he states any type of movement makes it worse.  No fevers or vomiting.  He has chronic constipation but usually goes every day.  However he has not had a bowel movement since 2 days ago.  Past Medical History:  Diagnosis Date  . Aortic stenosis   . Arthritis   . Atrial fibrillation (HCC)    Chronic  . Atrial fibrillation, chronic   . BPH (benign prostatic hyperplasia)   . CAD (coronary artery disease)   . Chronic anticoagulation   . History of chicken pox   . HTN (hypertension)   . Mitral stenosis   . Old MI (myocardial infarction) 2007   s/p PCI to distal OM (no stent)  . Severe mitral regurgitation   . 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 Appendicitis, acute 04/21/2018  . Pain in left knee 02/26/2018  . Pain in right knee 02/26/2018  . Fall 01/21/2018  . Closed fracture of left inferior pubic ramus ([place]) 01/21/2018  . Constipation 12/19/2017  .  hernia of linea alba 12/19/2017  . Osteoarthritis of hip 09/24/2017  . Gastroenteritis 09/10/2017  . S/P TAVR (transcatheter aortic valve replacement) 09/04/2017  . Severe mitral  regurgitation   . Insomnia 07/10/2017  . High risk medication use 07/10/2017  . Long term current use of anticoagulant 07/10/2017  . Chronic diastolic CHF (congestive heart failure) ([place]) 05/20/2017  . Hyponatremia 05/20/2017  . Severe aortic stenosis 05/20/2017  . Urinary retention   . 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 Spinal stenosis at L4-L5 level 04/26/2017  . Lumbar radiculopathy, chronic 04/26/2017  . Benign colonic polyp- 25 yrs ago.  11/16/2016  . Vitamin D deficiency 11/16/2016  . H/O non anemic vitamin B12 deficiency 11/16/2016  . Chronic Colonic dysmotility 11/16/2016  . Hypokalemia 09/13/2016  . BPH with obstruction/lower urinary tract symptoms 09/13/2016  . Urinary frequency 07/10/2016  . Left bundle branch block (LBBB) on electrocardiogram 06/30/2016  . History of stroke 06/30/2016  . Generalized weakness 06/30/2016  . Asthenia 06/30/2016  . Osteoarthritis 04/04/2016  . Encounter for therapeutic drug monitoring 08/06/2013  . Chronic anticoagulation   . HTN (hypertension)   . CAD (coronary artery disease)   . Chronic atrial fibrillation 09/13/2010  . h/o CVA (cerebrovascular accident due to intracerebral hemorrhage) 09/13/2010    Past Surgical History:  Procedure Laterality Date  . CORONARY ANGIOPLASTY  2007   Distal OM  . PROSTATE BIOPSY    . RIGHT/LEFT HEART CATH AND CORONARY ANGIOGRAPHY N/A 08/01/2017  Procedure: RIGHT/LEFT HEART CATH AND CORONARY ANGIOGRAPHY;  Surgeon: Sherren Mocha, MD;  Location: [facility] [place];  Service: Cardiovascular;  Laterality: N/A;  . TEE WITHOUT CARDIOVERSION N/A 07/20/2017   Procedure: TRANSESOPHAGEAL ECHOCARDIOGRAM (TEE);  Surgeon: Lelon Perla, MD;  Location: [facility] ENDOSCOPY;  Service: Cardiovascular;  Laterality: N/A;  . TEE WITHOUT CARDIOVERSION N/A 09/04/2017   Procedure: TRANSESOPHAGEAL ECHOCARDIOGRAM (TEE);  Surgeon: Sherren Mocha, MD;  Location: [place];  Service: Open Heart Surgery;   Laterality: N/A;  . TRANSCATHETER AORTIC VALVE REPLACEMENT, TRANSFEMORAL N/A 09/04/2017   Procedure: TRANSCATHETER AORTIC VALVE REPLACEMENT, TRANSFEMORAL;  Surgeon: Sherren Mocha, MD;  Location: [facility];  Service: Open Heart Surgery;  Laterality: N/A;        Home Medications    Prior to Admission medications   Medication Sig Start Date End Date Taking? Authorizing Provider  cholecalciferol 2000 units TABS Take 1 tablet (2,000 Units total) by mouth daily. 05/24/17  Yes Regalado, Belkys A, MD  digoxin (LANOXIN) 0.125 MG tablet Take 1 tablet (125 mcg total) by mouth daily. 09/18/17  Yes Lendon Colonel, NP  furosemide (LASIX) 20 MG tablet TAKE 2 TABLETS BY MOUTH ONCE DAILY 04/03/18  Yes Lelon Perla, MD  metoprolol tartrate (LOPRESSOR) 25 MG tablet Take 0.5 tablets (12.5 mg total) by mouth 2 (two) times daily. Patient taking differently: Take 12.5-25 mg by mouth 2 (two) times daily. Two 1/2 am 1/2 pm 09/18/17  Yes Lendon Colonel, NP  potassium chloride (K-DUR,KLOR-CON) 10 MEQ tablet Take 1 tablet (10 mEq total) by mouth daily. 09/18/17  Yes Lendon Colonel, NP  senna (SENOKOT) 8.6 MG TABS tablet Take 2 tablets (17.2 mg total) by mouth daily. 01/29/18  Yes Hongalgi, Lenis Dickinson, MD  warfarin (COUMADIN) 2.5 MG tablet TAKE 1 TO 2 TABLETS BY MOUTH ONCE DAILY AS  DIRECTED  BY  COUMADIN  CLINIC Patient taking differently: Take 2.5 mg by mouth daily. Tuesday 2.5mg  All other days 5mg  04/16/18  Yes Crenshaw, Denice Bors, MD  acetaminophen (TYLENOL) 500 MG tablet Take 1,000 mg by mouth every 6 (six) hours as needed for moderate pain or headache.    [provider]  alfuzosin (UROXATRAL) 10 MG 24 hr tablet Take 10 mg by mouth daily with breakfast.    [provider]  ARTIFICIAL TEAR OP Place 1 drop into both eyes daily as needed (dry eyes).    [provider]  docusate sodium (COLACE) 100 MG capsule Take 100 mg by mouth every evening.    [provider]  finasteride  (PROSCAR) 5 MG tablet Take 5 mg by mouth at bedtime.  02/14/17   Lelon Perla, MD  furosemide (LASIX) 40 MG tablet Take 0.5 tablets (20 mg total) by mouth 2 (two) times daily as needed for fluid. 01/28/18   Hongalgi, Lenis Dickinson, MD  Melatonin 1 MG TABS Take 5 mg by mouth at bedtime.     [provider]  Menthol, Topical Analgesic, (BIOFREEZE EX) Apply 1 application topically 3 (three) times daily as needed (muscle pain in back). alterrnates between [employer] and Blu [employer]mu    [provider]  Menthol, Topical Analgesic, (BLUE-EMU MAXIMUM STRENGTH EX) Apply 1 application topically 3 (three) times daily as needed (muscle pain in back). alterrnates between [employer] and [employer]    [provider]  polyethylene glycol (MIRALAX / GLYCOLAX) packet Take 17 g by mouth 2 (two) times daily. 01/28/18   Hongalgi, Lenis Dickinson, MD  sodium chloride (OCEAN) 0.65 %  SOLN nasal spray Place 1 spray into both nostrils daily as needed for congestion.    [provider]  traMADol (ULTRAM) 50 MG tablet Take 1 tablet (50 mg total) by mouth every 6 (six) hours as needed for severe pain. Patient not taking: Reported on 04/21/2018 01/28/18   Modena Jansky, MD  triamcinolone cream (KENALOG) 0.1 % Apply 1 application topically daily as needed (dry skin).     [provider]  warfarin (COUMADIN) 5 MG tablet Take 1 tablet (5 mg total) by mouth daily at 6 PM. 01/29/18   Hongalgi, Lenis Dickinson, MD    Family History Family History  Problem Relation Age of Onset  . Heart disease Mother   . Heart attack Mother   . Heart disease Father   . Heart attack Father   . Heart attack Brother   . Heart attack Sister   . Stroke Brother     Social History Social History   Tobacco Use  . Smoking status: Never Smoker  . Smokeless tobacco: Never Used  Substance Use Topics  . Alcohol use: No  . Drug use: No     Allergies   Penicillins; Prednisone; Procaine hcl; Levofloxacin; and  Oxycodone   Review of Systems Review of Systems  Constitutional: Negative for fever.  Respiratory: Negative for shortness of breath.   Cardiovascular: Negative for chest pain.  Gastrointestinal: Positive for abdominal pain and constipation. Negative for diarrhea, nausea and vomiting.  Genitourinary: Negative for dysuria.  Musculoskeletal: Positive for back pain.  All other systems reviewed and are negative.    Physical Exam Updated Vital Signs BP 104/66   Pulse 77   Temp 97.9 F (36.6 C) (Oral)   Resp 18   Ht 5' 10.5" (1.791 m)   Wt 74.8 kg   SpO2 98%   BMI 23.34 kg/m   Physical Exam  Constitutional: He appears well-developed and well-nourished.  HENT:  Head: Normocephalic and atraumatic.  Right Ear: External ear normal.  Left Ear: External ear normal.  Nose: Nose normal.  Eyes: Right eye exhibits no discharge. Left eye exhibits no discharge.  Neck: Neck supple.  Cardiovascular: Normal rate and regular rhythm.  Murmur heard. Pulmonary/Chest: Effort normal and breath sounds normal.  Abdominal: Soft. There is tenderness in the right lower quadrant. A hernia is present. Hernia confirmed positive in the ventral area (umbilical, no incarceration).    Musculoskeletal: He exhibits no edema.  Neurological: He is alert.  Skin: Skin is warm and dry.  Psychiatric: His mood appears not anxious.  Nursing note and vitals reviewed.    ED Treatments / Results  Labs (all labs ordered are listed, but only abnormal results are displayed) Labs Reviewed  COMPREHENSIVE METABOLIC PANEL - Abnormal; Notable for the following components:      Result Value   Glucose, Bld 157 (*)    Calcium 8.6 (*)    Total Protein 6.1 (*)    Albumin 3.4 (*)    All other components within normal limits  CBC - Abnormal; Notable for the following components:   RBC 4.08 (*)    Hemoglobin 12.0 (*)    HCT 38.0 (*)    RDW 16.2 (*)    Platelets 147 (*)    All other components within normal limits   PROTIME-INR - Abnormal; Notable for the following components:   Prothrombin Time 24.8 (*)    All other components within normal limits  LIPASE, BLOOD  URINALYSIS, ROUTINE W REFLEX MICROSCOPIC    EKG  None  Radiology Ct Abdomen Pelvis W Contrast  Result Date: 04/21/2018 CLINICAL DATA:  Abdominal pain. EXAM: CT ABDOMEN AND PELVIS WITH CONTRAST TECHNIQUE: Multidetector CT imaging of the abdomen and pelvis was performed using the standard protocol following bolus administration of intravenous contrast. CONTRAST:  152mL OMNIPAQUE IOHEXOL 300 MG/ML  SOLN COMPARISON:  CT abdomen pelvis dated March 23, 2018. FINDINGS: Lower chest: Small right and trace left pleural effusions, slightly decreased when compared to prior study. Bilateral lower lobe subsegmental atelectasis. Hepatobiliary: No focal liver abnormality is seen. No gallstones, gallbladder wall thickening, or biliary dilatation. Pancreas: Unremarkable. No pancreatic ductal dilatation or surrounding inflammatory changes. Spleen: Normal in size without focal abnormality. Adrenals/Urinary Tract: The adrenal glands are unremarkable. Unchanged left renal simple cyst. No renal or ureteral calculi. No hydronephrosis. Unchanged left posterolateral bladder diverticulum. No bladder wall thickening. Stomach/Bowel: Dilatation of the distal appendix with prominent distal periappendiceal inflammatory changes, consistent with acute appendicitis. Appendix: Location: Right lower quadrant, inferior to the cecum. Diameter: 11 mm. Appendicolith: None. Mucosal hyper-enhancement: Mild. Extraluminal gas: None. Periappendiceal collection: Small phlegmonous change adjacent to the tip of the appendix without discrete fluid collection. The stomach, small bowel, and colon are unremarkable. No obstruction. Vascular/Lymphatic: Aortic atherosclerosis. No enlarged abdominal or pelvic lymph nodes. Reproductive: Stable prostatomegaly. Other: No free fluid or pneumoperitoneum. Left  pelvic hematoma has resolved. Musculoskeletal: No acute or significant osseous findings. Healing left superior and inferior pubic rami fractures. Chronic L3 compression deformity. IMPRESSION: 1. Acute appendicitis. Small phlegmonous change adjacent to the tip may reflect microperforation. No extraluminal gas or discrete periappendiceal fluid collection. 2. Slightly decreased small right trace left pleural effusions. 3. Healing left superior and inferior pubic rami fractures. Resolved left pelvic hematoma. 4.  Aortic atherosclerosis (ICD10-I70.0). Electronically Signed   By: Titus Dubin M.D.   On: 04/21/2018 14:33    Procedures Procedures (including critical care time)  Medications Ordered in ED Medications  ceFEPIme (MAXIPIME) 2 g in sodium chloride 0.9 % 100 mL IVPB (has no administration in time range)    And  metroNIDAZOLE (FLAGYL) IVPB 500 mg (has no administration in time range)  iohexol (OMNIPAQUE) 300 MG/ML solution 100 mL (100 mLs Intravenous Contrast Given 04/21/18 1345)     Initial Impression / Assessment and Plan / ED Course  I have reviewed the triage vital signs and the nursing notes.  Pertinent labs & imaging results that were available during my care of the patient were reviewed by me and considered in my medical decision making (see chart for details).     CT scan shows acute appendicitis with small phlegmon.  No abscess or perforation.  This would explain his right sided abdominal pain.  Overall his labs are reassuring and baseline.  He is not ill-appearing.  Discussed with Dr. Donne Hazel, plan for IV antibiotics and admission to medicine service.  Family practice to admit.  Final Clinical Impressions(s) / ED Diagnoses   Final diagnoses:  Uncomplicated acute appendicitis    ED Discharge Orders    None       Sherwood Gambler, MD 04/21/18 1534

## 2018-04-21 NOTE — H&P (Addendum)
[facility] Admission History and Physical Service Pager: [phone]  Patient name: Wayne [name] Medical record number: [id number] Date of birth: 1929-09-19 Age: 84 y.o. Gender: male  Primary Care Provider: [name], DO Consultants: Surgery Code Status: DNR  Chief Complaint: abdominal pain  Assessment and Plan: Wayne [name] is a 84 y.o. male presenting with acute appendicitis. PMH is significant for atrial fibrillation on chronic anticoagulation, aortic valve replacement with porcine valve, CAD, CVD, BPH, MI status post PCI to OM without stent, CVA, and hypertension.  Acute appendicitis: Patient experienced umbilical swelling that began 3 days prior, but then self resolved 2 days prior to presentation when right lower quadrant abdominal pain began with radiation to the low back. Sitting and lying improves the pain while movement makes it worse. Pain increased to the point that patient decided to come to the emergency department. CT scan showed acute appendicitis and small phlegmonous change adjacent to the tip that may reflect microperforation. However, white blood cell count within normal limits, patient afebrile, and there is no explicit tenderness to right lower quadrant palpation on physical exam. The patient's history of swelling and pain to low right lower quadrant is concerning for hernia, which is however not appreciated on exam. CT scan also negative for hernia but does not specifically rule out GI tumor (sometimes an underlying cause of appendicitis in elderly patients) or metastasis. -Admit to telemetry, attending Dr. Gwendlyn Deutscher -Surgery consulted - patient can have liquids, n.p.o. after midnight, initiate antibiotics and monitor for 24 hours, hold Coumadin, initiate vitamin K.  -Cefepime and Flagyl IV -Continuous cardiac and pulse ox monitoring -Tylenol PRN pain -AM BMP, CBC, INR -IV normal saline at 100 cc/hour once patient is  NPO -Up with assistance -Daily vitals -We will evaluate need for further intervention based on clinical picture, abdominal exam, vitals, and lab values  Atrial fibrillation chronic, stable: Patient in A. fib on physical exam in the emergency department.  Managed with digoxin 125 mcg daily and Lopressor 12.5 mg twice daily.  Rate is well controlled. Treated with Coumadin, last INR therapeutic at 2.3. -Hold Coumadin and administer vitamin K in preparation for potential surgery -Continue digoxin and metoprolol  Aortic valve replacement, chronic, stable: Porcine valve via TAVR in March 2019. Treated at home with warfarin. INR therapeutic on admission at 2-3.  -Hold Coumadin  Hx CVA with balance deficit, chronic, stable: Cerebellar stroke in 2007 resulted in residual imbalance problems.  -Monitor -ambulate with assistance  Constipation, chronic: Patient has a history of constipation, however usually has BM once daily.  On admission, patient has not had BM in 2 days -Continue home Senokot daily, Colace daily, and MiraLAX twice daily as needed  Mild neurocognitive decline: Patient has experienced mild neurocognitive decline and short-term memory loss since aortic valve replacement in March 2019.  On exam in the emergency department, patient is neurologically intact and alert and oriented x3. -Monitor -Keep patient oriented during the day by displaying time, day, date, keeping room well lit during the day and dark at night.  BPH, chronic, stable: Patient reports having urinary frequency due to BPH.  Takes finasteride 5 mg daily and alfuzosin 10 mg daily. -Continue home meds  FEN/GI: Clear liquid, IV normal saline at 100 cc/hour once NPO, Senokot, Colace, MiraLAX; melatonin 6 mg for sleep Prophylaxis: Heparin  Disposition: Admit to telemetry  History of Present Illness:  Wayne [name] is a 84 y.o. male presenting with abdominal swelling and pain.  He first  noticed swelling "that looked  like an orange" around his umbilicus on Thursday, October 31.  This became accompanied by pain in his RLQ on Friday, November 1 with pain radiating to his low back.  Movement exacerbated the pain, while staying still and having a bowel movement alleviated the pain.  He denies trying new activities, traveling or eating new foods recently.  He denies constitutional symptoms including fever, chills, vomiting, and diarrhea. He endorses constipation, which is chronic and treated with miralax, senokot, and dulcolax.    Review Of Systems: Per HPI with the following additions:   Review of Systems  Constitutional: Negative for chills, fever, malaise/fatigue and weight loss.  HENT: Negative for congestion.   Respiratory: Negative for cough.   Cardiovascular: Negative for chest pain.  Gastrointestinal: Positive for abdominal pain and constipation. Negative for diarrhea, nausea and vomiting.  Genitourinary: Positive for frequency.  Neurological: Negative for headaches.  Psychiatric/Behavioral: Negative for depression. The patient is not nervous/anxious.     Patient Active Problem List   Diagnosis Date Noted  . Appendicitis, acute 04/21/2018  . Pain in left knee 02/26/2018  . Pain in right knee 02/26/2018  . Fall 01/21/2018  . Closed fracture of left inferior pubic ramus ([place]) 01/21/2018  . Constipation 12/19/2017  .  hernia of linea alba 12/19/2017  . Osteoarthritis of hip 09/24/2017  . Gastroenteritis 09/10/2017  . S/P TAVR (transcatheter aortic valve replacement) 09/04/2017  . Severe mitral regurgitation   . Insomnia 07/10/2017  . High risk medication use 07/10/2017  . Long term current use of anticoagulant 07/10/2017  . Chronic diastolic CHF (congestive heart failure) ([place]) 05/20/2017  . Hyponatremia 05/20/2017  . Severe aortic stenosis 05/20/2017  . Urinary retention   . 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 Spinal stenosis at L4-L5 level 04/26/2017  .  Lumbar radiculopathy, chronic 04/26/2017  . Benign colonic polyp- 25 yrs ago.  11/16/2016  . Vitamin D deficiency 11/16/2016  . H/O non anemic vitamin B12 deficiency 11/16/2016  . Chronic Colonic dysmotility 11/16/2016  . Hypokalemia 09/13/2016  . BPH with obstruction/lower urinary tract symptoms 09/13/2016  . Urinary frequency 07/10/2016  . Left bundle branch block (LBBB) on electrocardiogram 06/30/2016  . History of stroke 06/30/2016  . Generalized weakness 06/30/2016  . Asthenia 06/30/2016  . Osteoarthritis 04/04/2016  . Encounter for therapeutic drug monitoring 08/06/2013  . Chronic anticoagulation   . HTN (hypertension)   . CAD (coronary artery disease)   . Chronic atrial fibrillation 09/13/2010  . h/o CVA (cerebrovascular accident due to intracerebral hemorrhage) 09/13/2010    Past Medical History: Past Medical History:  Diagnosis Date  . Aortic stenosis   . Arthritis   . Atrial fibrillation (HCC)    Chronic  . Atrial fibrillation, chronic   . BPH (benign prostatic hyperplasia)   . CAD (coronary artery disease)   . Chronic anticoagulation   . History of chicken pox   . HTN (hypertension)   . Mitral stenosis   . Old MI (myocardial infarction) 2007   s/p PCI to distal OM (no stent)  . Severe mitral regurgitation   . Stroke, embolic [facility])    [id number] weeks after his heart attack    Past Surgical History: Past Surgical History:  Procedure Laterality Date  . CORONARY ANGIOPLASTY  2007   Distal OM  . PROSTATE BIOPSY    . RIGHT/LEFT HEART CATH AND CORONARY ANGIOGRAPHY N/A 08/01/2017   Procedure: RIGHT/LEFT HEART CATH AND CORONARY ANGIOGRAPHY;  Surgeon: Burt Knack,  Legrand Como, MD;  Location: [place] [facility];  Service: Cardiovascular;  Laterality: N/A;  . TEE WITHOUT CARDIOVERSION N/A 07/20/2017   Procedure: TRANSESOPHAGEAL ECHOCARDIOGRAM (TEE);  Surgeon: Lelon Perla, MD;  Location: [facility] ENDOSCOPY;  Service: Cardiovascular;  Laterality: N/A;  . TEE WITHOUT CARDIOVERSION  N/A 09/04/2017   Procedure: TRANSESOPHAGEAL ECHOCARDIOGRAM (TEE);  Surgeon: Sherren Mocha, MD;  Location: [place];  Service: Open Heart Surgery;  Laterality: N/A;  . TRANSCATHETER AORTIC VALVE REPLACEMENT, TRANSFEMORAL N/A 09/04/2017   Procedure: TRANSCATHETER AORTIC VALVE REPLACEMENT, TRANSFEMORAL;  Surgeon: Sherren Mocha, MD;  Location: [place];  Service: Open Heart Surgery;  Laterality: N/A;    Social History: Social History   Tobacco Use  . Smoking status: Never Smoker  . Smokeless tobacco: Never Used  Substance Use Topics  . Alcohol use: No  . Drug use: No   Family History: Family History  Problem Relation Age of Onset  . Heart disease Mother   . Heart attack Mother   . Heart disease Father   . Heart attack Father   . Heart attack Brother   . Heart attack Sister   . Stroke Brother    Allergies and Medications: Allergies  Allergen Reactions  . Penicillins Rash and Other (See Comments)    PATIENT HAS HAD A PCN REACTION WITH IMMEDIATE RASH, FACIAL/TONGUE/THROAT SWELLING, SOB, OR LIGHTHEADEDNESS WITH HYPOTENSION:  #  #  #  YES  #  #  #   Has patient had a PCN reaction causing severe rash involving mucus membranes or skin necrosis: No Has patient had a PCN reaction that required hospitalization No Has patient had a PCN reaction occurring within the last 10 years: No If all of the above answers are "NO", then may proceed with Cephalosporin use.  . Prednisone Other (See Comments)    Wheezing  . Procaine Hcl Other (See Comments)    Passed out after being given this at dental appointment  . Levofloxacin Rash  . Oxycodone Other (See Comments)    constipation   No current facility-administered medications on file prior to encounter.    Current Outpatient Medications on File Prior to Encounter  Medication Sig Dispense Refill  . cholecalciferol 2000 units TABS Take 1 tablet (2,000 Units total) by mouth daily. 30 tablet 0  . digoxin (LANOXIN) 0.125 MG tablet Take 1 tablet (125  mcg total) by mouth daily. 90 tablet 3  . furosemide (LASIX) 20 MG tablet TAKE 2 TABLETS BY MOUTH ONCE DAILY 90 tablet 0  . metoprolol tartrate (LOPRESSOR) 25 MG tablet Take 0.5 tablets (12.5 mg total) by mouth 2 (two) times daily. (Patient taking differently: Take 12.5-25 mg by mouth 2 (two) times daily. Two 1/2 am 1/2 pm) 30 tablet 6  . potassium chloride (K-DUR,KLOR-CON) 10 MEQ tablet Take 1 tablet (10 mEq total) by mouth daily. 30 tablet 3  . senna (SENOKOT) 8.6 MG TABS tablet Take 2 tablets (17.2 mg total) by mouth daily.    Marland Kitchen warfarin (COUMADIN) 2.5 MG tablet TAKE 1 TO 2 TABLETS BY MOUTH ONCE DAILY AS  DIRECTED  BY  COUMADIN  CLINIC (Patient taking differently: Take 2.5 mg by mouth daily. Tuesday 2.5mg  All other days 5mg ) 180 tablet 1  . acetaminophen (TYLENOL) 500 MG tablet Take 1,000 mg by mouth every 6 (six) hours as needed for moderate pain or headache.    . alfuzosin (UROXATRAL) 10 MG 24 hr tablet Take 10 mg by mouth daily with breakfast.    . ARTIFICIAL TEAR OP Place  1 drop into both eyes daily as needed (dry eyes).    Marland Kitchen docusate sodium (COLACE) 100 MG capsule Take 100 mg by mouth every evening.    . finasteride (PROSCAR) 5 MG tablet Take 5 mg by mouth at bedtime.     . furosemide (LASIX) 40 MG tablet Take 0.5 tablets (20 mg total) by mouth 2 (two) times daily as needed for fluid.    . Melatonin 1 MG TABS Take 5 mg by mouth at bedtime.     . Menthol, Topical Analgesic, (BIOFREEZE EX) Apply 1 application topically 3 (three) times daily as needed (muscle pain in back). alterrnates between [employer] and Blu Emu    . Menthol, Topical Analgesic, (BLUE-EMU MAXIMUM STRENGTH EX) Apply 1 application topically 3 (three) times daily as needed (muscle pain in back). alterrnates between [employer] and [employer]mu    . polyethylene glycol (MIRALAX / GLYCOLAX) packet Take 17 g by mouth 2 (two) times daily.    . sodium chloride (OCEAN) 0.65 % SOLN nasal spray Place 1 spray into both nostrils daily as  needed for congestion.    . traMADol (ULTRAM) 50 MG tablet Take 1 tablet (50 mg total) by mouth every 6 (six) hours as needed for severe pain. (Patient not taking: Reported on 04/21/2018) 20 tablet 0  . triamcinolone cream (KENALOG) 0.1 % Apply 1 application topically daily as needed (dry skin).     Marland Kitchen warfarin (COUMADIN) 5 MG tablet Take 1 tablet (5 mg total) by mouth daily at 6 PM.     Objective: BP 118/85 (BP Location: Left Arm)   Pulse (!) 105   Temp 98.2 F (36.8 C) (Oral)   Resp 18   Ht 5' 10.5" (1.791 m)   Wt 74.8 kg   SpO2 98%   BMI 23.34 kg/m  Physical Exam  Constitutional: He is oriented to person, place, and time. He appears well-developed and well-nourished.  Non-toxic appearance. No distress.  HENT:  Head: Normocephalic.  Eyes: EOM are normal.  Cardiovascular: Normal rate and intact distal pulses.  No murmur heard. Irregularly irregular rhythm  Pulmonary/Chest: Effort normal and breath sounds normal.  Abdominal: Normal appearance and bowel sounds are normal. He exhibits no distension and no ascites. There is no tenderness. There is no rebound, no guarding and no tenderness at McBurney's point. No hernia.  Benign exam  Neurological: He is alert and oriented to person, place, and time.  Skin: Skin is warm and dry.  Psychiatric: He has a normal mood and affect. His behavior is normal.   Labs and Imaging: CBC BMET  Recent Labs  Lab 04/21/18 1213  WBC 7.3  HGB 12.0*  HCT 38.0*  PLT 147*   Recent Labs  Lab 04/21/18 1213  NA 138  K 3.5  CL 102  CO2 28  BUN 12  CREATININE 1.06  GLUCOSE 157*  CALCIUM 8.6*     Ct Abdomen Pelvis W Contrast  Result Date: 04/21/2018 CLINICAL DATA:  Abdominal pain. EXAM: CT ABDOMEN AND PELVIS WITH CONTRAST TECHNIQUE: Multidetector CT imaging of the abdomen and pelvis was performed using the standard protocol following bolus administration of intravenous contrast. CONTRAST:  132mL OMNIPAQUE IOHEXOL 300 MG/ML  SOLN COMPARISON:  CT  abdomen pelvis dated March 23, 2018. FINDINGS: Lower chest: Small right and trace left pleural effusions, slightly decreased when compared to prior study. Bilateral lower lobe subsegmental atelectasis. Hepatobiliary: No focal liver abnormality is seen. No gallstones, gallbladder wall thickening, or biliary dilatation. Pancreas: Unremarkable. No  pancreatic ductal dilatation or surrounding inflammatory changes. Spleen: Normal in size without focal abnormality. Adrenals/Urinary Tract: The adrenal glands are unremarkable. Unchanged left renal simple cyst. No renal or ureteral calculi. No hydronephrosis. Unchanged left posterolateral bladder diverticulum. No bladder wall thickening. Stomach/Bowel: Dilatation of the distal appendix with prominent distal periappendiceal inflammatory changes, consistent with acute appendicitis. Appendix: Location: Right lower quadrant, inferior to the cecum. Diameter: 11 mm. Appendicolith: None. Mucosal hyper-enhancement: Mild. Extraluminal gas: None. Periappendiceal collection: Small phlegmonous change adjacent to the tip of the appendix without discrete fluid collection. The stomach, small bowel, and colon are unremarkable. No obstruction. Vascular/Lymphatic: Aortic atherosclerosis. No enlarged abdominal or pelvic lymph nodes. Reproductive: Stable prostatomegaly. Other: No free fluid or pneumoperitoneum. Left pelvic hematoma has resolved. Musculoskeletal: No acute or significant osseous findings. Healing left superior and inferior pubic rami fractures. Chronic L3 compression deformity. IMPRESSION: 1. Acute appendicitis. Small phlegmonous change adjacent to the tip may reflect microperforation. No extraluminal gas or discrete periappendiceal fluid collection. 2. Slightly decreased small right trace left pleural effusions. 3. Healing left superior and inferior pubic rami fractures. Resolved left pelvic hematoma. 4.  Aortic atherosclerosis (ICD10-I70.0). Electronically Signed   By: Titus Dubin M.D.   On: 04/21/2018 14:33    Daisy Floro, DO 04/21/2018, 4:26 PM PGY-1, [place] Intern pager: [phone], text pages welcome  FPTS Upper-Level Resident Addendum   I have independently interviewed and examined the patient. I have discussed the above with the original author and agree with their documentation. My edits for correction/addition/clarification are in green. Please see also any attending notes.    Kathrene Alu, MD PGY-2, [facility] 04/21/2018 6:24 PM  [place] Service pager: [phone] (text pages welcome through [facility])

## 2018-04-22 ENCOUNTER — Encounter (HOSPITAL_COMMUNITY): Payer: Self-pay | Admitting: General Practice

## 2018-04-22 ENCOUNTER — Other Ambulatory Visit: Payer: Self-pay

## 2018-04-22 DIAGNOSIS — D649 Anemia, unspecified: Secondary | ICD-10-CM

## 2018-04-22 DIAGNOSIS — K3580 Unspecified acute appendicitis: Principal | ICD-10-CM

## 2018-04-22 DIAGNOSIS — E876 Hypokalemia: Secondary | ICD-10-CM

## 2018-04-22 LAB — BASIC METABOLIC PANEL
Anion gap: 4 — ABNORMAL LOW (ref 5–15)
BUN: 8 mg/dL (ref 8–23)
CHLORIDE: 109 mmol/L (ref 98–111)
CO2: 25 mmol/L (ref 22–32)
CREATININE: 0.81 mg/dL (ref 0.61–1.24)
Calcium: 8.2 mg/dL — ABNORMAL LOW (ref 8.9–10.3)
GFR calc non Af Amer: >60 mL/min (ref >60)
Glucose, Bld: 90 mg/dL (ref 70–99)
POTASSIUM: 3.3 mmol/L — AB (ref 3.5–5.1)
Sodium: 138 mmol/L (ref 135–145)

## 2018-04-22 LAB — CBC
HEMATOCRIT: 34.4 % — AB (ref 39.0–52.0)
HEMOGLOBIN: 10.8 g/dL — AB (ref 13.0–17.0)
MCH: 28.9 pg (ref 26.0–34.0)
MCHC: 31.4 g/dL (ref 30.0–36.0)
MCV: 92 fL (ref 80.0–100.0)
NRBC: 0 % (ref 0.0–0.2)
Platelets: 145 10*3/uL — ABNORMAL LOW (ref 150–400)
RBC: 3.74 MIL/uL — AB (ref 4.22–5.81)
RDW: 16 % — AB (ref 11.5–15.5)
WBC: 5.5 10*3/uL (ref 4.0–10.5)

## 2018-04-22 LAB — HEPARIN LEVEL (UNFRACTIONATED): Heparin Unfractionated: 0.52 IU/mL (ref 0.30–0.70)

## 2018-04-22 LAB — PROTIME-INR
INR: 1.45
Prothrombin Time: 17.5 seconds — ABNORMAL HIGH (ref 11.4–15.2)

## 2018-04-22 MED ORDER — HEPARIN BOLUS VIA INFUSION
4000.0000 [IU] | Freq: Once | INTRAVENOUS | Status: AC
  Administered 2018-04-22: 4000 [IU] via INTRAVENOUS
  Filled 2018-04-22: qty 4000

## 2018-04-22 MED ORDER — HEPARIN (PORCINE) IN NACL 100-0.45 UNIT/ML-% IJ SOLN
1350.0000 [IU]/h | INTRAMUSCULAR | Status: DC
  Administered 2018-04-22 – 2018-04-24 (×3): 1050 [IU]/h via INTRAVENOUS
  Administered 2018-04-26: 1300 [IU]/h via INTRAVENOUS
  Filled 2018-04-22 (×7): qty 250

## 2018-04-22 NOTE — Clinical Social Work Note (Signed)
Clinical Social Work Assessment  Patient Details  Name: Wayne [name] MRN: [id number] Date of Birth: [birth year]  Date of referral:  04/22/18               Reason for consult:  Facility Placement, Discharge Planning                Permission sought to share information with:  Family Supports, Customer service manager Permission granted to share information::  Yes, Verbal Permission Granted  Name::     Wayne [name]  Agency::  [facility]  Relationship::  wife  Contact Information:  [phone]  Housing/Transportation Living arrangements for the past 2 months:  [place] of Information:  Patient, Spouse Patient Interpreter Needed:  None Criminal Activity/Legal Involvement Pertinent to Current Situation/Hospitalization:  No - Comment as needed Significant Relationships:  Adult Children, Other Family Members, Spouse Lives with:  Spouse Do you feel safe going back to the place where you live?  Yes Need for family participation in patient care:  Yes (Comment)  Care giving concerns:  Pt recently went to SNF following a pelvic fracture, pt wife also recently at rehab for total hip replacement. Pt and pt wife receive support from pt children and have been managing at home.   Social Worker assessment / plan:  CSW met with pt and pt wife at bedside. Introduced self, role and reason for visit. Pt and pt wife familiar with CSW role. Both have recently stayed at [facility] following fractures and felt they received good care at [facility]. Pt has three children: two local to [place] and one in [place] Pt and pt wife will see how they do with therapy. If pt recommended for SNF then they would like [facility] again and to see their other options in network with [employer].  CSW will continue to follow.  Employment status:  Retired Nurse, adult PT Recommendations:  [place], [place] / Referral to  community resources:  [place]  Patient/Family's Response to care:  Pt and pt wife familiar with CSW role. They are interested in seeing how pt does with therapies before definitively making a decision regarding disposition.   Patient/Family's Understanding of and Emotional Response to Diagnosis, Current Treatment, and Prognosis:  Pt and pt family state understanding of diagnosis, current treatment and prognosis. Pt and pt wife are supportive of each other and express positive expectations for their care of each other. They are realistic with limitations of their ability to manage each others health, have children that are supportive. Pt and pt wife both emotionally appropriate, appear happy with care here at hospital.  Emotional Assessment Appearance:  Appears stated age Attitude/Demeanor/Rapport:  Engaged, Gracious Affect (typically observed):  Accepting, Adaptable, Appropriate, Quiet Orientation:  Oriented to Self, Oriented to Place, Oriented to  Time, Oriented to Situation Alcohol / Substance use:  Not Applicable Psych involvement (Current and /or in the community):  No (Comment)  Discharge Needs  Concerns to be addressed:  Care Coordination Readmission within the last 30 days:  No Current discharge risk:  Dependent with Mobility, Physical Impairment Barriers to Discharge:  Ship broker, Continued Medical Work up   [employer], [place] 04/22/2018, 3:25 PM

## 2018-04-22 NOTE — Evaluation (Signed)
Physical Therapy Evaluation Patient Details Name: Wayne [name] MRN: [id number] DOB: 1930/04/27 Today's Date: 04/22/2018   History of Present Illness  Pt is an 84 y/o male admitted secondary to swelling in abdomen. Found to have acute appendicitis. Pt with recent L superior and inferior pubic rami fractures and CT revealed healing of those fractures. PMH includes a fib, TAVR, CVA, MI, HTN, and CAD.   Clinical Impression  Pt admitted secondary to problem above with deficits below. Pt requiring min to min guard A for mobility using RW this session, however, overall tolerated ambulation well. Reviewed seated HEP. Pt and family reports plan is to go home at d/c. Anticipate pt will progress well and be able to d/c home with HHPT. Will continue to follow acutely to maximize functional mobility independence and safety.     Follow Up Recommendations Home health PT;Supervision for mobility/OOB    Equipment Recommendations  None recommended by PT    Recommendations for Other Services OT consult     Precautions / Restrictions Precautions Precautions: Fall Restrictions Weight Bearing Restrictions: No      Mobility  Bed Mobility Overal bed mobility: Needs Assistance Bed Mobility: Supine to Sit;Sit to Supine     Supine to sit: Supervision Sit to supine: Supervision   General bed mobility comments: Supervision for safety.   Transfers Overall transfer level: Needs assistance Equipment used: Rolling walker (2 wheeled) Transfers: Sit to/from Stand Sit to Stand: Min assist         General transfer comment: Min A for lift assist and steadying assist. Increased time required. Verbal cues for safe hand placement.   Ambulation/Gait Ambulation/Gait assistance: Min guard Gait Distance (Feet): 150 Feet Assistive device: Rolling walker (2 wheeled) Gait Pattern/deviations: Step-through pattern;Decreased stride length;Trunk flexed Gait velocity: Decreased    General Gait Details:  Slow, mildly unsteady gait; noted some drifting to L and R as well. No overt LOB noted. Pt reports he is close to normal for ambulation.   Stairs            Wheelchair Mobility    Modified Rankin (Stroke Patients Only)       Balance Overall balance assessment: Needs assistance Sitting-balance support: Feet supported;No upper extremity supported Sitting balance-Leahy Scale: Good     Standing balance support: Bilateral upper extremity supported;During functional activity Standing balance-Leahy Scale: Poor Standing balance comment: Reliant on BUE support                              Pertinent Vitals/Pain Pain Assessment: No/denies pain    Home Living Family/patient expects to be discharged to:: Private residence Living Arrangements: Spouse/significant other Available Help at Discharge: Family;Available 24 hours/day Type of Home: House Home Access: Stairs to enter Entrance Stairs-Rails: Chemical engineer of Steps: 2 Home Layout: Multi-level;Bed/bath upstairs Home Equipment: Environmental consultant - 2 wheels;Walker - 4 wheels;Cane - single point;Tub bench;Bedside commode;Grab bars - toilet;Grab bars - tub/shower Additional Comments: Wife has recently had fall and hip fracture, however, both have been cleared by HHPT. Pt's daughter and son in law able to assist as well.     Prior Function Level of Independence: Independent with assistive device(s)         Comments: Uses rollator for ambulation.      Hand Dominance   Dominant Hand: Right    Extremity/Trunk Assessment   Upper Extremity Assessment Upper Extremity Assessment: Defer to OT evaluation    Lower Extremity Assessment Lower Extremity  Assessment: Generalized weakness    Cervical / Trunk Assessment Cervical / Trunk Assessment: Kyphotic  Communication   Communication: HOH  Cognition Arousal/Alertness: Awake/alert Behavior During Therapy: WFL for tasks assessed/performed Overall Cognitive  Status: Within Functional Limits for tasks assessed                                        General Comments General comments (skin integrity, edema, etc.): Pt's wife and son in law present during session.     Exercises General Exercises - Lower Extremity Long Arc Quad: AROM;Both;10 reps;Seated Hip ABduction/ADduction: AROM;Both;10 reps;Seated(seated pillow squeezes, and resisted ABduction) Hip Flexion/Marching: AROM;Both;10 reps;Seated Toe Raises: AROM;Both;10 reps;Seated Heel Raises: AROM;Both;10 reps;Seated   Assessment/Plan    PT Assessment Patient needs continued PT services  PT Problem List Decreased strength;Decreased balance;Decreased mobility;Decreased knowledge of use of DME;Decreased knowledge of precautions       PT Treatment Interventions DME instruction;Gait training;Functional mobility training;Stair training;Therapeutic activities;Therapeutic exercise;Balance training;Patient/family education    PT Goals (Current goals can be found in the Care Plan section)  Acute Rehab PT Goals Patient Stated Goal: To go home PT Goal Formulation: With patient Time For Goal Achievement: 05/06/18 Potential to Achieve Goals: Fair    Frequency Min 3X/week   Barriers to discharge        Co-evaluation               AM-PAC PT "6 Clicks" Daily Activity  Outcome Measure Difficulty turning over in bed (including adjusting bedclothes, sheets and blankets)?: A Little Difficulty moving from lying on back to sitting on the side of the bed? : A Little Difficulty sitting down on and standing up from a chair with arms (e.g., wheelchair, bedside commode, etc,.)?: Unable Help needed moving to and from a bed to chair (including a wheelchair)?: A Little Help needed walking in hospital room?: A Little Help needed climbing 3-5 steps with a railing? : A Lot 6 Click Score: 15    End of Session Equipment Utilized During Treatment: Gait belt Activity Tolerance: Patient  tolerated treatment well Patient left: in bed;with call bell/phone within reach;with family/visitor present Nurse Communication: Mobility status PT Visit Diagnosis: Unsteadiness on feet (R26.81);Muscle weakness (generalized) (M62.81)    Time: 8786-7672 PT Time Calculation (min) (ACUTE ONLY): 18 min   Charges:   PT Evaluation $PT Eval Low Complexity: [place], PT, DPT  Acute Rehabilitation Services  Pager: [phone] Office: [phone]   Rudean Hitt 04/22/2018, 3:17 PM

## 2018-04-22 NOTE — Care Management Note (Addendum)
Case Management Note  Patient Details  Name: Ondra Deboard MRN: [id number] Date of Birth: [birth year]  Subjective/Objective:                    Action/Plan:  PT recommendations HHPT. Spoke to wife at bedside . Wife requesting [facility]. Have all DME at home already. Expected Discharge Date:                  Expected Discharge Plan:  [place]  In-House Referral:     Discharge planning Services  CM Consult  Post Acute Care Choice:  Durable Medical Equipment, Home Health Choice offered to:  Patient, Spouse  DME Arranged:    DME Agency:  NA  HH Arranged:    [place] Agency:  [facility]  Status of Service:  In process, will continue to follow  If discussed at Long Length of Stay Meetings, dates discussed:    Additional Comments:  Marilu Favre, RN 04/22/2018, 4:28 PM

## 2018-04-22 NOTE — Plan of Care (Signed)
  Problem: Education: Goal: Knowledge of General Education information will improve Description: Including pain rating scale, medication(s)/side effects and non-pharmacologic comfort measures Outcome: Progressing   Problem: Coping: Goal: Level of anxiety will decrease Outcome: Progressing   Problem: Pain Managment: Goal: General experience of comfort will improve Outcome: Progressing   

## 2018-04-22 NOTE — Progress Notes (Signed)
[place] for Heparin Indication: atrial fibrillation  Allergies  Allergen Reactions  . Penicillins Rash and Other (See Comments)    PATIENT HAS HAD A PCN REACTION WITH IMMEDIATE RASH, FACIAL/TONGUE/THROAT SWELLING, SOB, OR LIGHTHEADEDNESS WITH HYPOTENSION:  #  #  #  YES  #  #  #   Has patient had a PCN reaction causing severe rash involving mucus membranes or skin necrosis: No Has patient had a PCN reaction that required hospitalization No Has patient had a PCN reaction occurring within the last 10 years: No If all of the above answers are "NO", then may proceed with Cephalosporin use.  . Prednisone Other (See Comments)    Wheezing  . Procaine Hcl Other (See Comments)    Passed out after being given this at dental appointment  . Levofloxacin Rash  . Oxycodone Other (See Comments)    constipation    Patient Measurements: Height: 5' 10.5" (179.1 cm) Weight: 165 lb (74.8 kg) IBW/kg (Calculated) : 74.15  Vital Signs: Temp: 97.4 F (36.3 C) (11/04 2020) Temp Source: Oral (11/04 2020) BP: 117/61 (11/04 2020) Pulse Rate: 84 (11/04 2020)  Labs: Recent Labs    04/21/18 1213 04/21/18 1335 04/22/18 0541 04/22/18 2131  HGB 12.0*  --  10.8*  --   HCT 38.0*  --  34.4*  --   PLT 147*  --  145*  --   LABPROT  --  24.8* 17.5*  --   INR  --  2.27 1.45  --   HEPARINUNFRC  --   --   --  0.52  CREATININE 1.06  --  0.81  --     Estimated Creatinine Clearance: 68.7 mL/min (by C-G formula based on SCr of 0.81 mg/dL).   Medical History: Past Medical History:  Diagnosis Date  . Aortic stenosis   . Arthritis   . Atrial fibrillation (HCC)    Chronic  . Atrial fibrillation, chronic   . BPH (benign prostatic hyperplasia)   . CAD (coronary artery disease)   . Chronic anticoagulation   . History of chicken pox   . HTN (hypertension)   . Mitral stenosis   . Old MI (myocardial infarction) 2007   s/p PCI to distal OM (no stent)  . Severe mitral  regurgitation   . Stroke, embolic [facility])    3500-9 weeks after his heart attack    Medications:  Medications Prior to Admission  Medication Sig Dispense Refill Last Dose  . acetaminophen (TYLENOL) 500 MG tablet Take 1,000 mg by mouth every 6 (six) hours as needed for moderate pain or headache.   04/21/2018 at Unknown time  . alfuzosin (UROXATRAL) 10 MG 24 hr tablet Take 10 mg by mouth daily with breakfast.   04/21/2018 at Unknown time  . ARTIFICIAL TEAR OP Place 1 drop into both eyes daily as needed (dry eyes).   as needed  . cholecalciferol 2000 units TABS Take 1 tablet (2,000 Units total) by mouth daily. 30 tablet 0 04/21/2018 at Unknown time  . digoxin (LANOXIN) 0.125 MG tablet Take 1 tablet (125 mcg total) by mouth daily. 90 tablet 3 04/21/2018 at Unknown time  . docusate sodium (COLACE) 100 MG capsule Take 100 mg by mouth every evening.   04/21/2018 at Unknown time  . finasteride (PROSCAR) 5 MG tablet Take 5 mg by mouth at bedtime.    04/21/2018 at Unknown time  . furosemide (LASIX) 20 MG tablet TAKE 2 TABLETS BY MOUTH ONCE DAILY (Patient  taking differently: Take 20 mg by mouth 2 (two) times daily. **Take one half potassium with lasix**) 90 tablet 0 04/21/2018 at Unknown time  . Melatonin 10 MG TABS Take 10 mg by mouth at bedtime.    04/21/2018 at Unknown time  . Menthol, Topical Analgesic, (BIOFREEZE EX) Apply 1 application topically 3 (three) times daily as needed (muscle pain in back). alterrnates between [employer] and Blu Emu   as needed  . Menthol, Topical Analgesic, (BLUE-EMU MAXIMUM STRENGTH EX) Apply 1 application topically 3 (three) times daily as needed (muscle pain in back). alterrnates between [employer] and [employer]mu   as needed  . metoprolol tartrate (LOPRESSOR) 25 MG tablet Take 0.5 tablets (12.5 mg total) by mouth 2 (two) times daily. (Patient taking differently: Take 12.5-25 mg by mouth 2 (two) times daily. Take two half tablets (25mg ) by mouth every morning and one-half (12.5mg )  tablet at bedtime.) 30 tablet 6 04/21/2018 at Unknown time  . polyethylene glycol (MIRALAX / GLYCOLAX) packet Take 17 g by mouth 2 (two) times daily. (Patient taking differently: Take 17 g by mouth daily as needed for mild constipation. )   as needed  . potassium chloride (K-DUR) 10 MEQ tablet Take 5 mEq by mouth daily as needed for fluid. TAKE POTASSIUM IF YOU TAKE LASIX  3 04/21/2018 at Unknown time  . potassium chloride (K-DUR,KLOR-CON) 10 MEQ tablet Take 1 tablet (10 mEq total) by mouth daily. 30 tablet 3 04/20/2018 at Unknown time  . senna (SENOKOT) 8.6 MG TABS tablet Take 2 tablets (17.2 mg total) by mouth daily.   04/20/2018 at Unknown time  . sodium chloride (OCEAN) 0.65 % SOLN nasal spray Place 1 spray into both nostrils daily as needed for congestion.   as needed  . triamcinolone cream (KENALOG) 0.1 % Apply 1 application topically daily as needed (dry skin).    as needed  . warfarin (COUMADIN) 2.5 MG tablet TAKE 1 TO 2 TABLETS BY MOUTH ONCE DAILY AS  DIRECTED  BY  COUMADIN  CLINIC (Patient taking differently: Take 2.5-5 mg by mouth See admin instructions. Take 2.5mg  on Tuesdays and all other days take 5mg ) 180 tablet 1 04/21/2018 at Unknown time  . furosemide (LASIX) 40 MG tablet Take 0.5 tablets (20 mg total) by mouth 2 (two) times daily as needed for fluid. (Patient not taking: Reported on 04/22/2018)   Not Taking at Unknown time  . warfarin (COUMADIN) 5 MG tablet Take 1 tablet (5 mg total) by mouth daily at 6 PM. (Patient not taking: Reported on 04/22/2018)   Not Taking at Unknown time    Assessment: 84 y.o. M presents with acute appendicitis. Pt on coumadin PTA for afib (admission INR therapeutic) - held and reversed with vit K 10mg  IV 11/3. INR now down to 1.45. Current plan for medical management and to continue holding coumadin in case surgery needed. Pharmacy consulted to start heparin bridge.   Initial heparin level therapeutic  Goal of Therapy:  Heparin level 0.3-0.7 units/ml Monitor  platelets by anticoagulation protocol: Yes   Plan:  Heparin gtt at 1050 units/hr Daily heparin level and CBC F/u ability to transition back to coumadin  Thanks for allowing pharmacy to be a part of this patient's care.  Excell Seltzer, PharmD Clinical Pharmacist

## 2018-04-22 NOTE — NC FL2 (Signed)
[place] MEDICAID FL2 LEVEL OF CARE SCREENING TOOL     IDENTIFICATION  Patient Name: Wayne [name] Birthdate: 06/18/1930 Sex: male Admission Date (Current Location): 04/21/2018  [facility] and [place] Number:  Herbalist and Address:  [facility], [place] [address] [place], [place] [address]      Provider Number: [id number]  Attending Physician Name and Address:  Kinnie Feil, MD  Relative Name and Phone Number:  Norvel Wenker, wife, [phone]    Current Level of Care: Hospital Recommended Level of Care: [place] Prior Approval Number:    Date Approved/Denied:   PASRR Number: [id number]  Discharge Plan: SNF    Current Diagnoses: Patient Active Problem List   Diagnosis Date Noted  . Normocytic anemia   . Appendicitis, acute 04/21/2018  . Pain in left knee 02/26/2018  . Pain in right knee 02/26/2018  . Fall 01/21/2018  . Closed fracture of left inferior pubic ramus ([place]) 01/21/2018  . Constipation 12/19/2017  .  hernia of linea alba 12/19/2017  . Osteoarthritis of hip 09/24/2017  . Gastroenteritis 09/10/2017  . S/P TAVR (transcatheter aortic valve replacement) 09/04/2017  . Severe mitral regurgitation   . Insomnia 07/10/2017  . High risk medication use 07/10/2017  . Long term current use of anticoagulant 07/10/2017  . Chronic diastolic CHF (congestive heart failure) ([place]) 05/20/2017  . Hyponatremia 05/20/2017  . Severe aortic stenosis 05/20/2017  . Urinary retention   . 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 Spinal stenosis at L4-L5 level 04/26/2017  . Lumbar radiculopathy, chronic 04/26/2017  . Benign colonic polyp- 25 yrs ago.  11/16/2016  . Vitamin D deficiency 11/16/2016  . H/O non anemic vitamin B12 deficiency 11/16/2016  . Chronic Colonic dysmotility 11/16/2016  . Hypokalemia 09/13/2016  . BPH with obstruction/lower urinary tract symptoms 09/13/2016  . Urinary  frequency 07/10/2016  . Left bundle branch block (LBBB) on electrocardiogram 06/30/2016  . History of stroke 06/30/2016  . Generalized weakness 06/30/2016  . Asthenia 06/30/2016  . Osteoarthritis 04/04/2016  . Encounter for therapeutic drug monitoring 08/06/2013  . Chronic anticoagulation   . HTN (hypertension)   . CAD (coronary artery disease)   . Chronic atrial fibrillation 09/13/2010  . h/o CVA (cerebrovascular accident due to intracerebral hemorrhage) 09/13/2010    Orientation RESPIRATION BLADDER Height & Weight     Self, Time, Situation, Place  Normal Continent, External catheter Weight: 165 lb (74.8 kg) Height:  5' 10.5" (179.1 cm)  BEHAVIORAL SYMPTOMS/MOOD NEUROLOGICAL BOWEL NUTRITION STATUS      Continent Diet(see discharge summary)  AMBULATORY STATUS COMMUNICATION OF NEEDS Skin   Extensive Assist Verbally                         Personal Care Assistance Level of Assistance  Bathing, Feeding, Dressing Bathing Assistance: Maximum assistance Feeding assistance: Independent Dressing Assistance: Maximum assistance     Functional Limitations Info  Sight, Hearing, Speech Sight Info: Adequate Hearing Info: Impaired(hard of hearing) Speech Info: Adequate    SPECIAL CARE FACTORS FREQUENCY  OT (By licensed OT), PT (By licensed PT)     PT Frequency: 5x week OT Frequency: 5x week            Contractures Contractures Info: Not present    Additional Factors Info  Code Status, Allergies Code Status Info: Full Code Allergies Info: PENICILLINS, PREDNISONE, PROCAINE HCL, LEVOFLOXACIN, OXYCODONE  Current Medications (04/22/2018):  This is the current hospital active medication list Current Facility-Administered Medications  Medication Dose Route Frequency Provider Last Rate Last Dose  . 0.9 %  sodium chloride infusion   Intravenous Continuous Kathrene Alu, MD 100 mL/hr at 04/22/18 1417    . acetaminophen (TYLENOL) tablet 1,000 mg  1,000 mg Oral  Q6H PRN Kathrene Alu, MD   1,000 mg at 04/22/18 0109  . alfuzosin (UROXATRAL) 24 hr tablet 10 mg  10 mg Oral Q breakfast Kathrene Alu, MD   10 mg at 04/22/18 1102  . ceFEPIme (MAXIPIME) 2 g in sodium chloride 0.9 % 100 mL IVPB  2 g Intravenous Q12H Kinnie Feil, MD   Stopped at 04/22/18 1136  . cholecalciferol (VITAMIN D) tablet 2,000 Units  2,000 Units Oral Daily Kathrene Alu, MD   2,000 Units at 04/22/18 1102  . digoxin (LANOXIN) tablet 125 mcg  125 mcg Oral Daily Kathrene Alu, MD   125 mcg at 04/22/18 1102  . docusate sodium (COLACE) capsule 100 mg  100 mg Oral QPM Kathrene Alu, MD   100 mg at 04/21/18 1841  . finasteride (PROSCAR) tablet 5 mg  5 mg Oral QHS Kathrene Alu, MD   5 mg at 04/21/18 2232  . furosemide (LASIX) tablet 20 mg  20 mg Oral BID PRN Kathrene Alu, MD      . heparin ADULT infusion 100 units/mL (25000 units/217mL sodium chloride 0.45%)  1,050 Units/hr Intravenous Continuous Franky Macho, RPH 10.5 mL/hr at 04/22/18 1417 1,050 Units/hr at 04/22/18 1417  . Melatonin TABS 6 mg  6 mg Oral QHS Kathrene Alu, MD   6 mg at 04/21/18 2232  . metoprolol tartrate (LOPRESSOR) tablet 12.5 mg  12.5 mg Oral BID Kathrene Alu, MD   12.5 mg at 04/22/18 1102  . metroNIDAZOLE (FLAGYL) IVPB 500 mg  500 mg Intravenous Q8H Kathrene Alu, MD 100 mL/hr at 04/22/18 0814 500 mg at 04/22/18 0814  . polyethylene glycol (MIRALAX / GLYCOLAX) packet 17 g  17 g Oral BID Kathrene Alu, MD   17 g at 04/21/18 2228  . senna (SENOKOT) tablet 17.2 mg  2 tablet Oral Daily Kathrene Alu, MD   17.2 mg at 04/21/18 1653     Discharge Medications: Please see discharge summary for a list of discharge medications.  Relevant Imaging Results:  Relevant Lab Results:   Additional Information [id number] [place], [place]

## 2018-04-22 NOTE — Progress Notes (Signed)
Family Medicine Teaching Service Daily Progress Note Intern Pager: [phone]  Patient name: Wayne [name] Medical record number: [id number] Date of birth: January 27, 1930 Age: 84 y.o. Gender: male  Primary Care Provider: [name], DO Consultants: Surgery Code Status: DNR  Pt Overview and Major Events to Date:  11/3 - Admitted to tele with Acute Appendicitis  Wayne [name] is a 84 y.o. male presenting with acute appendicitis. PMH is significant for atrial fibrillation on chronic anticoagulation, aortic valve replacement with porcine valve, CAD, CVD, BPH, MI status post PCI to OM without stent, CVA, and hypertension.  Acute appendicitis: Patient experienced umbilical swelling that began 3 days prior, but then self resolved 2 days prior to presentation when RLQ abdominal pain began with radiation to the low back. Sitting and lying improves the pain while movement makes it worse. Pain increased to the point that patient decided to come to the emergency department. CT scan showed acute appendicitis and small phlegmonous change adjacent to the tip that may reflect microperforation. However, white blood cell count within normal limits, patient afebrile, and there is no explicit tenderness to right lower quadrant palpation on physical exam. The patient's history of swelling and pain to low right lower quadrant is concerning for hernia, which is however not appreciated on exam. CT scan also negative for hernia but does not specifically rule out GI tumor (sometimes an underlying cause of appendicitis in elderly patients) or metastasis. -Surgery consulted - patient can have liquids, NPO after midnight, initiate antibiotics and monitor for 24 hours, hold Coumadin, initiate vitamin K.  -Cefepime and Flagyl IV -Continuous cardiac and pulse ox monitoring -Tylenol PRN pain -AM BMP, CBC, INR -IV normal saline at 100 cc/hour once patient is NPO -Up with assistance -Daily vitals -We will  evaluate need for further intervention based on clinical picture, abdominal exam, vitals, and lab values  Atrial fibrillation chronic, stable: Patient in A. fib on physical exam. Managed with digoxin 125 mcg daily and Lopressor 12.5 mg twice daily. Rate is well controlled. Treated with Coumadin, last INR therapeutic at 2.3. -Hold Coumadin and administer vitamin K in preparation for potential surgery -Continue digoxin and metoprolol  Aortic valve replacement, chronic, stable: Porcine valve via TAVR in March 2019. Treated at home with warfarin. INR therapeutic on admission at 2-3.  -Hold Coumadin -Strict I&Os  Hx CVA with balance deficit, chronic, stable: Cerebellar stroke in 2007 resulted in residual imbalance problems.  -Monitor -ambulate with assistance  Constipation, chronic: Patient has a history of constipation, however usually has BM once daily.  On admission, patient has not had BM in 2 days -Continue home Senokot daily, Colace daily, and MiraLAX twice daily as needed  Mild neurocognitive decline: Patient has experienced mild neurocognitive decline and short-term memory loss since aortic valve replacement in March 2019.  On exam in the emergency department, patient is neurologically intact and alert and oriented x3. -Monitor -Keep patient oriented during the day by displaying time, day, date, keeping room well lit during the day and dark at night.  BPH, chronic, stable: Patient reports having urinary frequency due to BPH.  Takes finasteride 5 mg daily and alfuzosin 10 mg daily. -Continue home meds  FEN/GI: Clear liquid, IV normal saline at 100 cc/hour once NPO, Senokot, Colace, MiraLAX; melatonin 6 mg for sleep Prophylaxis: Heparin  Disposition: Admit to telemetry  Subjective:  Patient seen this morning sitting upright in his recliner, in anticipation of breakfast.  He reports doing well and denies nausea, vomiting, and diarrhea.  He reports that he has yet to have a bowel  movement, which has not happened now in 3 days.  Otherwise, he reports feeling the same as he did on admission, may be a little bit better.  Objective: Temp:  [97.8 F (36.6 C)-99 F (37.2 C)] 97.8 F (36.6 C) (11/04 0551) Pulse Rate:  [69-105] 80 (11/04 0551) Resp:  [17-18] 18 (11/04 0551) BP: (101-124)/(55-85) 107/57 (11/04 0551) SpO2:  [91 %-99 %] 91 % (11/04 0551) Weight:  [74.8 kg] 74.8 kg (11/03 1206)  Physical Exam  Constitutional: He is oriented to person, place, and time. He appears well-developed and well-nourished.  Non-toxic appearance. No distress.  HENT:  Head: Normocephalic.  Eyes: EOM are normal.  Cardiovascular: Normal rate and intact distal pulses.  No murmur heard. Irregularly irregular rhythm  Pulmonary/Chest: Effort normal and breath sounds normal.  Abdominal: Normal appearance and bowel sounds are normal. He exhibits no distension and no ascites. There is no tenderness. There is no rebound, no guarding and no tenderness at McBurney's point. No hernia.  Tenderness elicited with palpation to lateral RLQ.  Neurological: He is alert and oriented to person, place, and time.  Skin: Skin is warm and dry.  Psychiatric: He has a normal mood and affect. His behavior is normal.   Laboratory: Recent Labs  Lab 04/21/18 1213 04/22/18 0541  WBC 7.3 5.5  HGB 12.0* 10.8*  HCT 38.0* 34.4*  PLT 147* 145*   Recent Labs  Lab 04/21/18 1213 04/22/18 0541  NA 138 138  K 3.5 3.3*  CL 102 109  CO2 28 25  BUN 12 8  CREATININE 1.06 0.81  CALCIUM 8.6* 8.2*  PROT 6.1*  --   BILITOT 0.8  --   ALKPHOS 49  --   ALT 10  --   AST 18  --   GLUCOSE 157* 90   Imaging/Diagnostic Tests: Ct Abdomen Pelvis W Contrast  Result Date: 04/21/2018 CLINICAL DATA:  Abdominal pain. EXAM: CT ABDOMEN AND PELVIS WITH CONTRAST TECHNIQUE: Multidetector CT imaging of the abdomen and pelvis was performed using the standard protocol following bolus administration of intravenous contrast.  CONTRAST:  139mL OMNIPAQUE IOHEXOL 300 MG/ML  SOLN COMPARISON:  CT abdomen pelvis dated March 23, 2018. FINDINGS: Lower chest: Small right and trace left pleural effusions, slightly decreased when compared to prior study. Bilateral lower lobe subsegmental atelectasis. Hepatobiliary: No focal liver abnormality is seen. No gallstones, gallbladder wall thickening, or biliary dilatation. Pancreas: Unremarkable. No pancreatic ductal dilatation or surrounding inflammatory changes. Spleen: Normal in size without focal abnormality. Adrenals/Urinary Tract: The adrenal glands are unremarkable. Unchanged left renal simple cyst. No renal or ureteral calculi. No hydronephrosis. Unchanged left posterolateral bladder diverticulum. No bladder wall thickening. Stomach/Bowel: Dilatation of the distal appendix with prominent distal periappendiceal inflammatory changes, consistent with acute appendicitis. Appendix: Location: Right lower quadrant, inferior to the cecum. Diameter: 11 mm. Appendicolith: None. Mucosal hyper-enhancement: Mild. Extraluminal gas: None. Periappendiceal collection: Small phlegmonous change adjacent to the tip of the appendix without discrete fluid collection. The stomach, small bowel, and colon are unremarkable. No obstruction. Vascular/Lymphatic: Aortic atherosclerosis. No enlarged abdominal or pelvic lymph nodes. Reproductive: Stable prostatomegaly. Other: No free fluid or pneumoperitoneum. Left pelvic hematoma has resolved. Musculoskeletal: No acute or significant osseous findings. Healing left superior and inferior pubic rami fractures. Chronic L3 compression deformity. IMPRESSION: 1. Acute appendicitis. Small phlegmonous change adjacent to the tip may reflect microperforation. No extraluminal gas or discrete periappendiceal fluid collection. 2. Slightly decreased small right trace left  pleural effusions. 3. Healing left superior and inferior pubic rami fractures. Resolved left pelvic hematoma. 4.  Aortic  atherosclerosis (ICD10-I70.0). Electronically Signed   By: Titus Dubin M.D.   On: 04/21/2018 14:33     Daisy Floro, DO 04/22/2018, 8:26 AM PGY-1, [place] Intern pager: [phone], text pages welcome

## 2018-04-22 NOTE — Progress Notes (Signed)
ANTICOAGULATION CONSULT NOTE - Initial Consult  Pharmacy Consult for Heparin Indication: atrial fibrillation  Allergies  Allergen Reactions  . Penicillins Rash and Other (See Comments)    PATIENT HAS HAD A PCN REACTION WITH IMMEDIATE RASH, FACIAL/TONGUE/THROAT SWELLING, SOB, OR LIGHTHEADEDNESS WITH HYPOTENSION:  #  #  #  YES  #  #  #   Has patient had a PCN reaction causing severe rash involving mucus membranes or skin necrosis: No Has patient had a PCN reaction that required hospitalization No Has patient had a PCN reaction occurring within the last 10 years: No If all of the above answers are "NO", then may proceed with Cephalosporin use.  . Prednisone Other (See Comments)    Wheezing  . Procaine Hcl Other (See Comments)    Passed out after being given this at dental appointment  . Levofloxacin Rash  . Oxycodone Other (See Comments)    constipation    Patient Measurements: Height: 5' 10.5" (179.1 cm) Weight: 165 lb (74.8 kg) IBW/kg (Calculated) : 74.15  Vital Signs: Temp: 97.8 F (36.6 C) (11/04 0551) Temp Source: Oral (11/04 0551) BP: 107/57 (11/04 0551) Pulse Rate: 80 (11/04 0551)  Labs: Recent Labs    04/21/18 1213 04/21/18 1335 04/22/18 0541  HGB 12.0*  --  10.8*  HCT 38.0*  --  34.4*  PLT 147*  --  145*  LABPROT  --  24.8* 17.5*  INR  --  2.27 1.45  CREATININE 1.06  --  0.81    Estimated Creatinine Clearance: 68.7 mL/min (by C-G formula based on SCr of 0.81 mg/dL).   Medical History: Past Medical History:  Diagnosis Date  . Aortic stenosis   . Arthritis   . Atrial fibrillation (HCC)    Chronic  . Atrial fibrillation, chronic   . BPH (benign prostatic hyperplasia)   . CAD (coronary artery disease)   . Chronic anticoagulation   . History of chicken pox   . HTN (hypertension)   . Mitral stenosis   . Old MI (myocardial infarction) 2007   s/p PCI to distal OM (no stent)  . Severe mitral regurgitation   . Stroke, embolic [facility])    2423-5 weeks  after his heart attack    Medications:  Medications Prior to Admission  Medication Sig Dispense Refill Last Dose  . acetaminophen (TYLENOL) 500 MG tablet Take 1,000 mg by mouth every 6 (six) hours as needed for moderate pain or headache.   04/21/2018 at Unknown time  . alfuzosin (UROXATRAL) 10 MG 24 hr tablet Take 10 mg by mouth daily with breakfast.   04/21/2018 at Unknown time  . ARTIFICIAL TEAR OP Place 1 drop into both eyes daily as needed (dry eyes).   as needed  . cholecalciferol 2000 units TABS Take 1 tablet (2,000 Units total) by mouth daily. 30 tablet 0 04/21/2018 at Unknown time  . digoxin (LANOXIN) 0.125 MG tablet Take 1 tablet (125 mcg total) by mouth daily. 90 tablet 3 04/21/2018 at Unknown time  . docusate sodium (COLACE) 100 MG capsule Take 100 mg by mouth every evening.   04/21/2018 at Unknown time  . finasteride (PROSCAR) 5 MG tablet Take 5 mg by mouth at bedtime.    04/21/2018 at Unknown time  . furosemide (LASIX) 20 MG tablet TAKE 2 TABLETS BY MOUTH ONCE DAILY (Patient taking differently: Take 20 mg by mouth 2 (two) times daily. **Take one half potassium with lasix**) 90 tablet 0 04/21/2018 at Unknown time  . Melatonin 10 MG TABS  Take 10 mg by mouth at bedtime.    04/21/2018 at Unknown time  . Menthol, Topical Analgesic, (BIOFREEZE EX) Apply 1 application topically 3 (three) times daily as needed (muscle pain in back). alterrnates between [employer] and Blu Emu   as needed  . Menthol, Topical Analgesic, (BLUE-EMU MAXIMUM STRENGTH EX) Apply 1 application topically 3 (three) times daily as needed (muscle pain in back). alterrnates between [employer] and [employer]   as needed  . metoprolol tartrate (LOPRESSOR) 25 MG tablet Take 0.5 tablets (12.5 mg total) by mouth 2 (two) times daily. (Patient taking differently: Take 12.5-25 mg by mouth 2 (two) times daily. Take two half tablets (25mg ) by mouth every morning and one-half (12.5mg ) tablet at bedtime.) 30 tablet 6 04/21/2018 at Unknown time  .  polyethylene glycol (MIRALAX / GLYCOLAX) packet Take 17 g by mouth 2 (two) times daily. (Patient taking differently: Take 17 g by mouth daily as needed for mild constipation. )   as needed  . potassium chloride (K-DUR) 10 MEQ tablet Take 5 mEq by mouth daily as needed for fluid. TAKE POTASSIUM IF YOU TAKE LASIX  3 04/21/2018 at Unknown time  . potassium chloride (K-DUR,KLOR-CON) 10 MEQ tablet Take 1 tablet (10 mEq total) by mouth daily. 30 tablet 3 04/20/2018 at Unknown time  . senna (SENOKOT) 8.6 MG TABS tablet Take 2 tablets (17.2 mg total) by mouth daily.   04/20/2018 at Unknown time  . sodium chloride (OCEAN) 0.65 % SOLN nasal spray Place 1 spray into both nostrils daily as needed for congestion.   as needed  . triamcinolone cream (KENALOG) 0.1 % Apply 1 application topically daily as needed (dry skin).    as needed  . warfarin (COUMADIN) 2.5 MG tablet TAKE 1 TO 2 TABLETS BY MOUTH ONCE DAILY AS  DIRECTED  BY  COUMADIN  CLINIC (Patient taking differently: Take 2.5-5 mg by mouth See admin instructions. Take 2.5mg  on Tuesdays and all other days take 5mg ) 180 tablet 1 04/21/2018 at Unknown time  . furosemide (LASIX) 40 MG tablet Take 0.5 tablets (20 mg total) by mouth 2 (two) times daily as needed for fluid. (Patient not taking: Reported on 04/22/2018)   Not Taking at Unknown time  . warfarin (COUMADIN) 5 MG tablet Take 1 tablet (5 mg total) by mouth daily at 6 PM. (Patient not taking: Reported on 04/22/2018)   Not Taking at Unknown time    Assessment: 84 y.o. M presents with acute appendicitis. Pt on coumadin PTA for afib (admission INR therapeutic) - held and reversed with vit K 10mg  IV 11/3. INR now down to 1.45. Current plan for medical management and to continue holding coumadin in case surgery needed. Pharmacy consulted to start heparin bridge.   Hgb 10.8, plt 145.  Goal of Therapy:  Heparin level 0.3-0.7 units/ml Monitor platelets by anticoagulation protocol: Yes   Plan:  D/c SQ  heparin Heparin bolus 4000 units Heparin gtt at 1050 units/hr Will f/u heparin level in 8 hours Daily heparin level and CBC F/u ability to transition back to coumadin  Sherlon Handing, PharmD, BCPS Clinical pharmacist  **Pharmacist phone directory can now be found on [email or web address] ([email or web address]).  Listed under [place]. 04/22/2018,12:22 PM

## 2018-04-22 NOTE — Progress Notes (Signed)
CC: Abdominal pain  Subjective: Patient is lying in bed and initially could not find any areas of discomfort.  I sat him up and he still does not have any discomfort, although he did have trouble maintaining a sitting position without support.  After laying back down and examining him he still has a little bit of discomfort in the right lower quadrant, but it seems minimal.  Objective: Vital signs in last 24 hours: Temp:  [97.8 F (36.6 C)-99 F (37.2 C)] 97.8 F (36.6 C) (11/04 0551) Pulse Rate:  [69-105] 80 (11/04 0551) Resp:  [17-18] 18 (11/04 0551) BP: (101-124)/(55-85) 107/57 (11/04 0551) SpO2:  [91 %-99 %] 91 % (11/04 0551) Weight:  [74.8 kg] 74.8 kg (11/03 1206) Last BM Date: 04/19/18 60 PO 700 IV 1000 urine Afebrile, VSS K+ 3.3 WBC 5.5    Intake/Output from previous day: 11/03 0701 - 11/04 0700 In: 760 [P.O.:60; I.V.:400.1; IV Piggyback:299.9] Out: 1000 [Urine:1000] Intake/Output this shift: Total I/O In: 131.7 [I.V.:131.7] Out: -   General appearance: alert, cooperative, no distress and Very weak he cannot support himself sitting up in bed. Resp: clear to auscultation bilaterally GI: Some tenderness in the right lower quadrant, but minimal positive bowel sounds, no abdominal distention, no peritonitis.  Lab Results:  Recent Labs    04/21/18 1213 04/22/18 0541  WBC 7.3 5.5  HGB 12.0* 10.8*  HCT 38.0* 34.4*  PLT 147* 145*    BMET Recent Labs    04/21/18 1213 04/22/18 0541  NA 138 138  K 3.5 3.3*  CL 102 109  CO2 28 25  GLUCOSE 157* 90  BUN 12 8  CREATININE 1.06 0.81  CALCIUM 8.6* 8.2*   PT/INR Recent Labs    04/21/18 1335 04/22/18 0541  LABPROT 24.8* 17.5*  INR 2.27 1.45    Recent Labs  Lab 04/21/18 1213  AST 18  ALT 10  ALKPHOS 49  BILITOT 0.8  PROT 6.1*  ALBUMIN 3.4*     Lipase     Component Value Date/Time   LIPASE 35 04/21/2018 1213     Prior to Admission medications   Medication Sig Start Date End Date  Taking? Authorizing Provider  cholecalciferol 2000 units TABS Take 1 tablet (2,000 Units total) by mouth daily. 05/24/17  Yes Regalado, Belkys A, MD  digoxin (LANOXIN) 0.125 MG tablet Take 1 tablet (125 mcg total) by mouth daily. 09/18/17  Yes Lendon Colonel, NP  furosemide (LASIX) 20 MG tablet TAKE 2 TABLETS BY MOUTH ONCE DAILY 04/03/18  Yes Lelon Perla, MD  metoprolol tartrate (LOPRESSOR) 25 MG tablet Take 0.5 tablets (12.5 mg total) by mouth 2 (two) times daily. Patient taking differently: Take 12.5-25 mg by mouth 2 (two) times daily. Two 1/2 am 1/2 pm 09/18/17  Yes Lendon Colonel, NP  potassium chloride (K-DUR,KLOR-CON) 10 MEQ tablet Take 1 tablet (10 mEq total) by mouth daily. 09/18/17  Yes Lendon Colonel, NP  senna (SENOKOT) 8.6 MG TABS tablet Take 2 tablets (17.2 mg total) by mouth daily. 01/29/18  Yes Hongalgi, Lenis Dickinson, MD  warfarin (COUMADIN) 2.5 MG tablet TAKE 1 TO 2 TABLETS BY MOUTH ONCE DAILY AS  DIRECTED  BY  COUMADIN  CLINIC Patient taking differently: Take 2.5 mg by mouth daily. Tuesday 2.5mg  All other days 5mg  04/16/18  Yes Crenshaw, Denice Bors, MD  acetaminophen (TYLENOL) 500 MG tablet Take 1,000 mg by mouth every 6 (six) hours as needed for moderate pain or headache.    [provider]  alfuzosin (UROXATRAL) 10 MG 24 hr tablet Take 10 mg by mouth daily with breakfast.    [provider]  ARTIFICIAL TEAR OP Place 1 drop into both eyes daily as needed (dry eyes).    [provider]  docusate sodium (COLACE) 100 MG capsule Take 100 mg by mouth every evening.    [provider]  finasteride (PROSCAR) 5 MG tablet Take 5 mg by mouth at bedtime.  02/14/17   Lelon Perla, MD  furosemide (LASIX) 40 MG tablet Take 0.5 tablets (20 mg total) by mouth 2 (two) times daily as needed for fluid. 01/28/18   Hongalgi, Lenis Dickinson, MD  Melatonin 1 MG TABS Take 5 mg by mouth at bedtime.     [provider]  Menthol, Topical Analgesic, (BIOFREEZE  EX) Apply 1 application topically 3 (three) times daily as needed (muscle pain in back). alterrnates between [employer] and [employer]    [provider]  Menthol, Topical Analgesic, (BLUE-EMU MAXIMUM STRENGTH EX) Apply 1 application topically 3 (three) times daily as needed (muscle pain in back). alterrnates between [employer] and [employer]    [provider]  polyethylene glycol (MIRALAX / GLYCOLAX) packet Take 17 g by mouth 2 (two) times daily. 01/28/18   Hongalgi, Lenis Dickinson, MD  sodium chloride (OCEAN) 0.65 % SOLN nasal spray Place 1 spray into both nostrils daily as needed for congestion.    [provider]  traMADol (ULTRAM) 50 MG tablet Take 1 tablet (50 mg total) by mouth every 6 (six) hours as needed for severe pain. Patient not taking: Reported on 04/21/2018 01/28/18   Modena Jansky, MD  triamcinolone cream (KENALOG) 0.1 % Apply 1 application topically daily as needed (dry skin).     [provider]  warfarin (COUMADIN) 5 MG tablet Take 1 tablet (5 mg total) by mouth daily at 6 PM. 01/29/18   Hongalgi, Lenis Dickinson, MD    Medications: . alfuzosin  10 mg Oral Q breakfast  . cholecalciferol  2,000 Units Oral Daily  . digoxin  125 mcg Oral Daily  . docusate sodium  100 mg Oral QPM  . finasteride  5 mg Oral QHS  . heparin  5,000 Units Subcutaneous Q8H  . Melatonin  6 mg Oral QHS  . metoprolol tartrate  12.5 mg Oral BID  . polyethylene glycol  17 g Oral BID  . senna  2 tablet Oral Daily   . sodium chloride 100 mL/hr at 04/22/18 0101  . ceFEPime (MAXIPIME) IV 2 g (04/21/18 2231)  . metronidazole 500 mg (04/22/18 0175)   Anti-infectives (From admission, onward)   Start     Dose/Rate Route Frequency Ordered Stop   04/21/18 2200  ceFEPIme (MAXIPIME) 2 g in sodium chloride 0.9 % 100 mL IVPB     2 g 200 mL/hr over 30 Minutes Intravenous Every 12 hours 04/21/18 1600     04/21/18 1630  metroNIDAZOLE (FLAGYL) IVPB 500 mg     500 mg 100 mL/hr over 60 Minutes  Intravenous Every 8 hours 04/21/18 1549     04/21/18 1445  ceFEPIme (MAXIPIME) 2 g in sodium chloride 0.9 % 100 mL IVPB     2 g 200 mL/hr over 30 Minutes Intravenous  Once 04/21/18 1444 04/22/18 0812   04/21/18 1445  metroNIDAZOLE (FLAGYL) IVPB 500 mg  Status:  Discontinued     500 mg 100 mL/hr over 60 Minutes Intravenous  Once 04/21/18 1444 04/21/18 1558  Assessment/Plan Aortic stenosis with TAVR Atrial fibrillation, chronic Hx CVA Chronic anticoagulation - INR 2.27, Vitamin K 10 mg  >> 1.45 today Hypertension BPH Hypokalemia - defer to Medicine  Acute appendicitis   - Planning medical management  FEN:  IV fluids/NPO/ PO meds ID:  Cefepime/Flagyl 11/3 =>> day 2 DVT: Heparin- prophylactic dosing - INR down to normal this AM - Heparin per pharmacy Follow up:  TBD   Plan: He seems fairly stable.  He still has a little bit of pain in the right lower quadrant but it appears to be less than yesterday.  He is extremely weak.  If medicine thinks he needs to be on IV heparin for his atrial fibrillation and TAVR back to be started today.  I am going to start him on clear liquids today.    LOS: 1 day    Danie Hannig 04/22/2018 [phone]

## 2018-04-23 ENCOUNTER — Inpatient Hospital Stay (HOSPITAL_COMMUNITY): Payer: Medicare HMO

## 2018-04-23 ENCOUNTER — Encounter (HOSPITAL_COMMUNITY): Payer: Self-pay | Admitting: General Practice

## 2018-04-23 DIAGNOSIS — R0600 Dyspnea, unspecified: Secondary | ICD-10-CM

## 2018-04-23 LAB — CBC
HEMATOCRIT: 34.7 % — AB (ref 39.0–52.0)
Hemoglobin: 10.7 g/dL — ABNORMAL LOW (ref 13.0–17.0)
MCH: 28.4 pg (ref 26.0–34.0)
MCHC: 30.8 g/dL (ref 30.0–36.0)
MCV: 92 fL (ref 80.0–100.0)
NRBC: 0 % (ref 0.0–0.2)
Platelets: 141 10*3/uL — ABNORMAL LOW (ref 150–400)
RBC: 3.77 MIL/uL — ABNORMAL LOW (ref 4.22–5.81)
RDW: 16.2 % — AB (ref 11.5–15.5)
WBC: 5.3 10*3/uL (ref 4.0–10.5)

## 2018-04-23 LAB — BASIC METABOLIC PANEL
Anion gap: 5 (ref 5–15)
BUN: 11 mg/dL (ref 8–23)
CHLORIDE: 111 mmol/L (ref 98–111)
CO2: 22 mmol/L (ref 22–32)
CREATININE: 0.91 mg/dL (ref 0.61–1.24)
Calcium: 8.3 mg/dL — ABNORMAL LOW (ref 8.9–10.3)
GFR calc Af Amer: >60 mL/min (ref >60)
GFR calc non Af Amer: >60 mL/min (ref >60)
GLUCOSE: 94 mg/dL (ref 70–99)
POTASSIUM: 3.7 mmol/L (ref 3.5–5.1)
SODIUM: 138 mmol/L (ref 135–145)

## 2018-04-23 LAB — HEPARIN LEVEL (UNFRACTIONATED): Heparin Unfractionated: 0.38 IU/mL (ref 0.30–0.70)

## 2018-04-23 LAB — PROTIME-INR
INR: 1.41
PROTHROMBIN TIME: 17.1 s — AB (ref 11.4–15.2)

## 2018-04-23 MED ORDER — GUAIFENESIN ER 600 MG PO TB12
600.0000 mg | ORAL_TABLET | Freq: Two times a day (BID) | ORAL | Status: DC
  Administered 2018-04-23 – 2018-04-26 (×7): 600 mg via ORAL
  Filled 2018-04-23 (×7): qty 1

## 2018-04-23 MED ORDER — FUROSEMIDE 20 MG PO TABS
10.0000 mg | ORAL_TABLET | Freq: Once | ORAL | Status: AC
  Administered 2018-04-23: 10 mg via ORAL
  Filled 2018-04-23: qty 1

## 2018-04-23 NOTE — Progress Notes (Addendum)
CC: Abdominal pain  Subjective: He feels short of breath this a.m. on oxygen.  He said I think I need a chest x-ray.  He continues to have some mid and right lower quadrant abdominal pain on palpation.  Objective: Vital signs in last 24 hours: Temp:  [97.4 F (36.3 C)-97.8 F (36.6 C)] 97.8 F (36.6 C) (11/05 0518) Pulse Rate:  [68-86] 86 (11/05 0630) Resp:  [17-18] 18 (11/05 0630) BP: (83-135)/(38-83) 135/83 (11/05 0630) SpO2:  [93 %-97 %] 93 % (11/05 0630) Weight:  [81.5 kg] 81.5 kg (11/05 0500) Last BM Date: 04/22/18 1282 PO 2500 IV 1150 urine Stool x 3 Weight 11/3 -74.8 kg >> 11/4  - 81.5 kg   Afebrile, VSS:   Sats down and had to go on O2 early this AM K+ 3.7 this AM WBC 5.3 Heparin level 0.38 INR 1.41 Intake/Output from previous day: 11/04 0701 - 11/05 0700 In: 3760.8 [P.O.:1282; I.V.:2078.7; IV Piggyback:400.1] Out: 1150 [Urine:1150] Intake/Output this shift: No intake/output data recorded.  General appearance: alert, cooperative and no distress Resp: Few rales at the bases and some end expiratory wheezing. GI: Still tender mid lower abdomen and right lower quadrant.  Lab Results:  Recent Labs    04/22/18 0541 04/23/18 0159  WBC 5.5 5.3  HGB 10.8* 10.7*  HCT 34.4* 34.7*  PLT 145* 141*    BMET Recent Labs    04/22/18 0541 04/23/18 0159  NA 138 138  K 3.3* 3.7  CL 109 111  CO2 25 22  GLUCOSE 90 94  BUN 8 11  CREATININE 0.81 0.91  CALCIUM 8.2* 8.3*   PT/INR Recent Labs    04/22/18 0541 04/23/18 0159  LABPROT 17.5* 17.1*  INR 1.45 1.41    Recent Labs  Lab 04/21/18 1213  AST 18  ALT 10  ALKPHOS 49  BILITOT 0.8  PROT 6.1*  ALBUMIN 3.4*     Lipase     Component Value Date/Time   LIPASE 35 04/21/2018 1213     Medications: . alfuzosin  10 mg Oral Q breakfast  . cholecalciferol  2,000 Units Oral Daily  . digoxin  125 mcg Oral Daily  . docusate sodium  100 mg Oral QPM  . finasteride  5 mg Oral QHS  . Melatonin  6 mg  Oral QHS  . metoprolol tartrate  12.5 mg Oral BID  . polyethylene glycol  17 g Oral BID  . senna  2 tablet Oral Daily   . sodium chloride 100 mL/hr at 04/23/18 0650  . ceFEPime (MAXIPIME) IV Stopped (04/22/18 2112)  . heparin 1,050 Units/hr (04/23/18 0650)  . metronidazole 500 mg (04/23/18 6948)    Assessment/Plan  Aortic stenosis with TAVR Atrial fibrillation, chronic Hx CVA Chronic anticoagulation - INR 2.27, Vitamin K 10 mg  >> 1.45 today Hypertension BPH Hypokalemia - defer to Medicine  Acute appendicitis   - Planning medical management  FEN:  IV fluids/NPO/ PO meds ID:  Cefepime/Flagyl 11/3 =>> day 3 DVT:  Heparin per pharmacy  -1050 units/h Follow up:  TBD  Plan: I contacted Medicine, Dr. Ouida Sills about his respiratory status.  They will see him shortly.  Continue IV antibiotics.  His IV fluids have been 100cc/hr  I have decreased them to [facility] until medicine sees him.  1305 hrs:  He is still concerned with breathing, Lasix is ordered PRN, I have given him 10mg  PO now, I also added Mucinex at his request, he uses this at home.  LOS: 2 days    Amier Hoyt 04/23/2018 [phone]

## 2018-04-23 NOTE — Progress Notes (Signed)
Placed patient on 2l/min O2 via [place] d/t c/o of difficulty breathing and gurgling in his chest, O2Sat at 90% on RA, then O2Sat at 93% on 2L/min via [place].  Patient bilateral hand grip are strong, able to raise both arms without difficulty, no facial droop, speech clear, denies any pain.  VSS with BP135/83, PR86, [id number], on telemetry monitor. Patient told to relax and take deep breaths to release anxiety.  Patient resting on bed comfortably, will monitor and endorse to day shift RN appropriately

## 2018-04-23 NOTE — Progress Notes (Signed)
Physical Therapy Treatment Patient Details Name: Wayne [name] MRN: [id number] DOB: [birth year] Today's Date: 04/23/2018    History of Present Illness per Pt session: Pt is an 84 y/o male admitted secondary to swelling in abdomen. Found to have acute appendicitis. Pt with recent L superior and inferior pubic rami fractures and CT revealed healing of those fractures. PMH includes a fib, TAVR, CVA, MI, HTN, and CAD.     PT Comments    Patient seen for mobility progression. Pt requires min A to functional transfers and min guard for gait training with rollator.  Pt will need supervision/assistance for OOB mobility upon d/c home given pt's current mobiltiy level and history of falls. Patient needs to practice stairs next session.     Follow Up Recommendations  Home health PT;Supervision for mobility/OOB     Equipment Recommendations  None recommended by PT    Recommendations for Other Services OT consult     Precautions / Restrictions Precautions Precautions: Fall Restrictions Weight Bearing Restrictions: No    Mobility  Bed Mobility Overal bed mobility: Needs Assistance Bed Mobility: Supine to Sit;Sit to Supine     Supine to sit: Supervision Sit to supine: Supervision   General bed mobility comments: for safety; increased time and effort  Transfers Overall transfer level: Needs assistance Equipment used: (rollator) Transfers: Sit to/from Stand Sit to Stand: Min assist         General transfer comment: use of rollator upon standing; assist to power up into standing; cues for safe hand placement  Ambulation/Gait Ambulation/Gait assistance: Min guard Gait Distance (Feet): 170 Feet Assistive device: (rollator) Gait Pattern/deviations: Step-through pattern;Decreased stride length;Trunk flexed;Narrow base of support Gait velocity: Decreased    General Gait Details: cues for posture, stride length, and cadence   Stairs             Wheelchair Mobility    Modified Rankin (Stroke Patients Only)       Balance Overall balance assessment: Needs assistance Sitting-balance support: Feet supported;No upper extremity supported Sitting balance-Leahy Scale: Good     Standing balance support: Bilateral upper extremity supported;During functional activity Standing balance-Leahy Scale: Poor Standing balance comment: Reliant on BUE support                             Cognition Arousal/Alertness: Awake/alert Behavior During Therapy: WFL for tasks assessed/performed Overall Cognitive Status: No family/caregiver present to determine baseline cognitive functioning Area of Impairment: Safety/judgement                         Safety/Judgement: Decreased awareness of deficits     General Comments: pt abandons the Rw even after education. pt unsteady on feet and lacks awareness to fall deficits      Exercises General Exercises - Lower Extremity Hip ABduction/ADduction: (`)    General Comments General comments (skin integrity, edema, etc.): SpO2 89-92% on RA during session      Pertinent Vitals/Pain Pain Assessment: No/denies pain    Home Living Family/patient expects to be discharged to:: Private residence Living Arrangements: Spouse/significant other Available Help at Discharge: Family;Available 24 hours/day Type of Home: House Home Access: Stairs to enter Entrance Stairs-Rails: Left;Right Home Layout: Multi-level;Bed/bath upstairs Home Equipment: Environmental consultant - 2 wheels;Walker - 4 wheels;Cane - single point;Tub bench;Bedside commode;Grab bars - toilet;Grab bars - tub/shower Additional Comments: Wife has recently had fall and hip fracture, however, both have been cleared by HHPT. Pt's  daughter and son in law able to assist as well.     Prior Function Level of Independence: Independent with assistive device(s)      Comments: Uses rollator for ambulation.    PT Goals (current goals can now be found in the care plan  section) Acute Rehab PT Goals Patient Stated Goal: to return home Progress towards PT goals: Progressing toward goals    Frequency    Min 3X/week      PT Plan Current plan remains appropriate    Co-evaluation              AM-PAC PT "6 Clicks" Daily Activity  Outcome Measure  Difficulty turning over in bed (including adjusting bedclothes, sheets and blankets)?: A Little Difficulty moving from lying on back to sitting on the side of the bed? : A Little Difficulty sitting down on and standing up from a chair with arms (e.g., wheelchair, bedside commode, etc,.)?: Unable Help needed moving to and from a bed to chair (including a wheelchair)?: A Little Help needed walking in hospital room?: A Little Help needed climbing 3-5 steps with a railing? : A Lot 6 Click Score: 15    End of Session Equipment Utilized During Treatment: Gait belt Activity Tolerance: Patient tolerated treatment well Patient left: in bed;with call bell/phone within reach Nurse Communication: Mobility status PT Visit Diagnosis: Unsteadiness on feet (R26.81);Muscle weakness (generalized) (M62.81)     Time: 4604-7998 PT Time Calculation (min) (ACUTE ONLY): 22 min  Charges:  $Gait Training: 8-22 mins                     Earney Navy, PTA Acute Rehabilitation Services Pager: [phone] Office: [phone]     Darliss Cheney 04/23/2018, 4:30 PM

## 2018-04-23 NOTE — Progress Notes (Signed)
[facility] for Heparin Indication: atrial fibrillation  Allergies  Allergen Reactions  . Penicillins Rash and Other (See Comments)    PATIENT HAS HAD A PCN REACTION WITH IMMEDIATE RASH, FACIAL/TONGUE/THROAT SWELLING, SOB, OR LIGHTHEADEDNESS WITH HYPOTENSION:  #  #  #  YES  #  #  #   Has patient had a PCN reaction causing severe rash involving mucus membranes or skin necrosis: No Has patient had a PCN reaction that required hospitalization No Has patient had a PCN reaction occurring within the last 10 years: No If all of the above answers are "NO", then may proceed with Cephalosporin use.  . Prednisone Other (See Comments)    Wheezing  . Procaine Hcl Other (See Comments)    Passed out after being given this at dental appointment  . Levofloxacin Rash  . Oxycodone Other (See Comments)    constipation    Patient Measurements: Height: 5' 10.5" (179.1 cm) Weight: 179 lb 10.8 oz (81.5 kg) IBW/kg (Calculated) : 74.15  Vital Signs: Temp: 97.8 F (36.6 C) (11/05 0518) Temp Source: Oral (11/05 0518) BP: 135/83 (11/05 0630) Pulse Rate: 86 (11/05 0630)  Labs: Recent Labs    04/21/18 1213 04/21/18 1335 04/22/18 0541 04/22/18 2131 04/23/18 0159  HGB 12.0*  --  10.8*  --  10.7*  HCT 38.0*  --  34.4*  --  34.7*  PLT 147*  --  145*  --  141*  LABPROT  --  24.8* 17.5*  --  17.1*  INR  --  2.27 1.45  --  1.41  HEPARINUNFRC  --   --   --  0.52 0.38  CREATININE 1.06  --  0.81  --  0.91    Estimated Creatinine Clearance: 61.2 mL/min (by C-G formula based on SCr of 0.91 mg/dL).  Assessment: 84 y.o. M presents with acute appendicitis. Pt on coumadin PTA for afib (admission INR therapeutic) - held and reversed with vit K 10mg  IV 11/3. INR down to 1.41. Current plan for medical management and to continue holding coumadin in case surgery needed. Pharmacy consulted to start heparin bridge.   Heparin level remains therapeutic (0.38) on gtt at 1050  units/hr. CBC stable. No bleeding noted.  Goal of Therapy:  Heparin level 0.3-0.7 units/ml Monitor platelets by anticoagulation protocol: Yes   Plan:  Heparin gtt at 1050 units/hr Daily heparin level and CBC F/u ability to transition back to coumadin  Thanks for allowing pharmacy to be a part of this patient's care.  Sherlon Handing, PharmD, BCPS Clinical pharmacist  **Pharmacist phone directory can now be found on [email or web address] ([email or web address]).  Listed under [place]. 04/23/2018 10:35 AM

## 2018-04-23 NOTE — Evaluation (Signed)
Occupational Therapy Evaluation Patient Details Name: Wayne [name] MRN: [id number] DOB: 01-Dec-1929 Today's Date: 04/23/2018    History of Present Illness per Pt session: Pt is an 84 y/o male admitted secondary to swelling in abdomen. Found to have acute appendicitis. Pt with recent L superior and inferior pubic rami fractures and CT revealed healing of those fractures. PMH includes a fib, TAVR, CVA, MI, HTN, and CAD.    Clinical Impression   PT admitted with appendicitis. Pt currently with functional limitiations due to the deficits listed below (see OT problem list). Pt currently requires min (A) with RW. Pt is high fall risk due to poor return demo with RW and needs cues for safety with RW.  Pt will benefit from skilled OT to increase their independence and safety with adls and balance to allow discharge Tower with min (A) for transfers. (no family present to verify assistance at home and wife is in snf currently per chart). If family can not accommodate (A) then will need SNF again     Follow Up Recommendations  Home health OT;Supervision/Assistance - 24 hour(high fall risk)    Equipment Recommendations  None recommended by OT    Recommendations for Other Services       Precautions / Restrictions Precautions Precautions: Fall Restrictions Weight Bearing Restrictions: No      Mobility Bed Mobility Overal bed mobility: Needs Assistance Bed Mobility: Supine to Sit     Supine to sit: Min assist     General bed mobility comments: pt reaching for therapist and says "are you not strong enough to lift me" pt encourage to log roll and rotate core instead of reliance on staff for position change. pt needs min cues for sequence  Transfers Overall transfer level: Needs assistance Equipment used: Rolling walker (2 wheeled) Transfers: Sit to/from Stand Sit to Stand: Min assist         General transfer comment: pt lifting RW at times and needs cues to keep Rw on the  floor.     Balance Overall balance assessment: Needs assistance Sitting-balance support: Feet supported;No upper extremity supported Sitting balance-Leahy Scale: Good     Standing balance support: Bilateral upper extremity supported;During functional activity Standing balance-Leahy Scale: Poor Standing balance comment: Reliant on BUE support                            ADL either performed or assessed with clinical judgement   ADL Overall ADL's : Needs assistance/impaired Eating/Feeding: Modified independent   Grooming: Min guard;Standing Grooming Details (indicate cue type and reason): pt requires UE support and (A) to remain with RW                 Toilet Transfer: Minimal Insurance claims handler Details (indicate cue type and reason): pt requires mod cues to use RW and toilet. pt attempting to abandon RW and does not understand static standing in RW instead of trying to hold the urinal.  Toileting- Clothing Manipulation and Hygiene: Minimal assistance       Functional mobility during ADLs: Minimal assistance;Rolling walker General ADL Comments: pt attempting to carry RW at times during session. pt needs mod cues for postioining, remaining in the Rw, keeping the rw and not to carry the RW     Vision         Perception     Praxis      Pertinent Vitals/Pain Pain Assessment: No/denies pain  Hand Dominance Right   Extremity/Trunk Assessment Upper Extremity Assessment Upper Extremity Assessment: RUE deficits/detail RUE Deficits / Details: tremor noted and decr coordination.   Lower Extremity Assessment Lower Extremity Assessment: Defer to PT evaluation   Cervical / Trunk Assessment Cervical / Trunk Assessment: Kyphotic   Communication Communication Communication: HOH   Cognition Arousal/Alertness: Awake/alert Behavior During Therapy: WFL for tasks assessed/performed Overall Cognitive Status: Impaired/Different from baseline Area of  Impairment: Safety/judgement                         Safety/Judgement: Decreased awareness of deficits     General Comments: pt abandons the Rw even after education. pt unsteady on feet and lacks awareness to fall deficits   General Comments  Pt on 3 L oxgyen    Exercises General Exercises - Lower Extremity Hip ABduction/ADduction: (`)   Shoulder Instructions      Home Living Family/patient expects to be discharged to:: Private residence Living Arrangements: Spouse/significant other Available Help at Discharge: Family;Available 24 hours/day Type of Home: House Home Access: Stairs to enter [employer] of Steps: 2 Entrance Stairs-Rails: Left;Right Home Layout: Multi-level;Bed/bath upstairs Alternate Level Stairs-Number of Steps: has chair lift    Bathroom Shower/Tub: Occupational psychologist: Standard     Home Equipment: Environmental consultant - 2 wheels;Walker - 4 wheels;Cane - single point;Tub bench;Bedside commode;Grab bars - toilet;Grab bars - tub/shower   Additional Comments: Wife has recently had fall and hip fracture, however, both have been cleared by HHPT. Pt's daughter and son in law able to assist as well.       Prior Functioning/Environment Level of Independence: Independent with assistive device(s)        Comments: Uses rollator for ambulation.         OT Problem List: Decreased strength;Decreased activity tolerance;Impaired balance (sitting and/or standing);Decreased cognition;Decreased safety awareness;Decreased knowledge of use of DME or AE;Decreased knowledge of precautions      OT Treatment/Interventions: Self-care/ADL training;Therapeutic exercise;Energy conservation;DME and/or AE instruction;Therapeutic activities;Cognitive remediation/compensation;Patient/family education;Balance training    OT Goals(Current goals can be found in the care plan section) Acute Rehab OT Goals Patient Stated Goal: to return home OT Goal Formulation:  With patient Time For Goal Achievement: 05/07/18 Potential to Achieve Goals: Good  OT Frequency: Min 2X/week   Barriers to D/C: Decreased caregiver support  wife in Rehab for fal with fx       Co-evaluation              AM-PAC PT "6 Clicks" Daily Activity     Outcome Measure Help from another person eating meals?: None Help from another person taking care of personal grooming?: A Little Help from another person toileting, which includes using toliet, bedpan, or urinal?: A Little Help from another person bathing (including washing, rinsing, drying)?: A Little Help from another person to put on and taking off regular upper body clothing?: A Little Help from another person to put on and taking off regular lower body clothing?: A Lot 6 Click Score: 18   End of Session Equipment Utilized During Treatment: Gait belt;Rolling walker Nurse Communication: Mobility status;Precautions  Activity Tolerance: Patient tolerated treatment well Patient left: in chair;with call bell/phone within reach;with nursing/sitter in room  OT Visit Diagnosis: Unsteadiness on feet (R26.81);Repeated falls (R29.6);Muscle weakness (generalized) (M62.81)                Time: 4259-5638 OT Time Calculation (min): 16 min Charges:  OT General Charges $OT  Visit: 1 Visit OT Evaluation $OT Eval Moderate Complexity: 1 Mod   Jeri Modena, OTR/L  Acute Rehabilitation Services Pager: [phone] Office: [phone] .   Parke Poisson B 04/23/2018, 2:23 PM

## 2018-04-23 NOTE — Progress Notes (Signed)
Family Medicine Teaching Service Daily Progress Note Intern Pager: [phone]  Patient name: Wayne [name] Medical record number: [id number] Date of birth: Apr 13, 1930 Age: 84 y.o. Gender: male  Primary Care Provider: [name], DO Consultants: Surgery Code Status: DNR  Pt Overview and Major Events to Date:  11/3 - Admitted to tele with Acute Appendicitis  Wayne [name] a 84 y.o.malepresenting with acute appendicitis. PMH is significant foratrial fibrillationonchronic anticoagulation,aorticvalve replacement with porcine valve, CAD, CVD, BPH, MI status post PCI to OM without stent, CVA, and hypertension.  Acute appendicitis, stable:CT scan showed acute appendicitis and small phlegmonous change adjacent to the tipthatmay reflect microperforation.WBC within normal limits, patient afebrile, and there is no explicit tenderness to RLQ palpation  -Surgery consulted - patient can have liquids, continue antibiotics, hold Coumadin, initiate vitamin K. INR 2.27 > 1.45 > 1.41 (11/5).  -Cefepime and Flagyl IV -Continuous cardiac and pulse ox monitoring -Tylenol PRN pain -AMBMP, CBC, INR -IV normal saline at 100 cc/houronce patient is NPO -Up with assistance -Daily vitals -We will evaluate need for further intervention based on clinical picture, abdominal exam, vitals, and lab values  Shortness of breath: patient started having SOB overnight 11/4 - 11/5 with desatting into low 90s and placed on 2L [place]. Potentially due to being fluid overloaded as patient is on IV maintenance fluids as well as consuming liquids PO. Was not fluid overloaded on physical exam 11/4 (no lower extremity edema to calves or thighs).  - d/c IV fluids - Continue PO fluid intake ad lib - Continue lasix  Atrial fibrillationchronic, stable:Patient in A. fib on physical exam.Managed with digoxin 125 mcg daily and Lopressor 12.5 mg twice daily. Rate is well controlled. Treated with Coumadin,  last INR 1.41. On heparin. 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 Treated at home with warfarin. INR 2.27 > 1.41. On heparin. -Hold Coumadin -Strict I&Os  Hx CVA with balance deficit, chronic, stable: Cerebellar stroke in2007resulted in residualimbalanceproblems.  -Monitor -ambulate with assistance  Constipation, chronic:Patient has a history of constipation, however usually has BM once daily.  Patient most recently had a bowel movement on 11/4 and again on 11/5. -ContinuehomeSenokot daily, Colace daily, and MiraLAX twice daily as needed  Mild neurocognitive decline:Patient has experienced mild neurocognitive decline and short-term memory loss since aortic valve replacement in March 2019. On exam in the emergency department, patient is neurologically intact and alert and oriented x3. -Monitor -Keep patient oriented during the day by displaying time, day, date, keeping room well lit during the day and dark at night.  BPH, chronic, stable:Patient reports having urinary frequency due to BPH. Takes finasteride 5 mg daily and alfuzosin 10 mg daily. -Continue homemeds  FEN/GI: Full liquid diet, Senokot, Colace, MiraLAX; melatonin 6 mg for sleep Prophylaxis:Heparin  Disposition:Admit to telemetry  Subjective:  Patient was seen sitting upright in his recliner eating breakfast, with his wife beside him.  Overnight the patient had an incident of desatted to 93% and requiring 2 L nasal cannula.  This morning he is without his nasal cannula and breathing comfortably, without respiratory distress.  His wife states he normally takes Mucinex every morning to help clear sputum from his lungs, however he has not had that since being in the hospital.  Otherwise, he denies cough, shortness of breath, chest pain, nausea, vomiting, and  constipation.  He had a bowel movement on 11/4 and again this  morning 11/5.  Objective: Temp:  [97.4 F (36.3 C)-97.8 F (36.6 C)] 97.8 F (36.6 C) (11/05 0518) Pulse Rate:  [68-86] 86 (11/05 0630) Resp:  [17-18] 18 (11/05 0630) BP: (83-135)/(38-83) 135/83 (11/05 0630) SpO2:  [93 %-97 %] 93 % (11/05 0630) Weight:  [81.5 kg] 81.5 kg (11/05 0500)  Physical Exam  Constitutional: He is oriented to person, place, and time. He appears well-developed and well-nourished.  Non-toxic appearance. No distress.  HENT:  Head: Normocephalic.  Eyes: EOM are normal.  Cardiovascular: Normal rate and intact distal pulses.  No murmur heard. Irregularly irregular rhythm  Pulmonary/Chest: Effort normal and breath sounds normal.  Abdominal: Normal appearance and bowel sounds are normal. He exhibits no distension and no ascites. There is no tenderness. There is no rebound, no guarding and no tenderness at McBurney's point. No hernia.  Tenderness elicited with palpation to lateral RLQ.  Neurological: He is alert and oriented to person, place, and time.  Skin: Skin is warm and dry.  Psychiatric: He has a normal mood and affect. His behavior is normal.   Laboratory: Recent Labs  Lab 04/21/18 1213 04/22/18 0541 04/23/18 0159  WBC 7.3 5.5 5.3  HGB 12.0* 10.8* 10.7*  HCT 38.0* 34.4* 34.7*  PLT 147* 145* 141*   Recent Labs  Lab 04/21/18 1213 04/22/18 0541 04/23/18 0159  NA 138 138 138  K 3.5 3.3* 3.7  CL 102 109 111  CO2 28 25 22   BUN 12 8 11   CREATININE 1.06 0.81 0.91  CALCIUM 8.6* 8.2* 8.3*  PROT 6.1*  --   --   BILITOT 0.8  --   --   ALKPHOS 49  --   --   ALT 10  --   --   AST 18  --   --   GLUCOSE 157* 90 94   Imaging/Diagnostic Tests: Ct Abdomen Pelvis W Contrast  Result Date: 04/21/2018 CLINICAL DATA:  Abdominal pain. EXAM: CT ABDOMEN AND PELVIS WITH CONTRAST TECHNIQUE: Multidetector CT imaging of the abdomen and pelvis was performed using the standard protocol following bolus  administration of intravenous contrast. CONTRAST:  16mL OMNIPAQUE IOHEXOL 300 MG/ML  SOLN COMPARISON:  CT abdomen pelvis dated March 23, 2018. FINDINGS: Lower chest: Small right and trace left pleural effusions, slightly decreased when compared to prior study. Bilateral lower lobe subsegmental atelectasis. Hepatobiliary: No focal liver abnormality is seen. No gallstones, gallbladder wall thickening, or biliary dilatation. Pancreas: Unremarkable. No pancreatic ductal dilatation or surrounding inflammatory changes. Spleen: Normal in size without focal abnormality. Adrenals/Urinary Tract: The adrenal glands are unremarkable. Unchanged left renal simple cyst. No renal or ureteral calculi. No hydronephrosis. Unchanged left posterolateral bladder diverticulum. No bladder wall thickening. Stomach/Bowel: Dilatation of the distal appendix with prominent distal periappendiceal inflammatory changes, consistent with acute appendicitis. Appendix: Location: Right lower quadrant, inferior to the cecum. Diameter: 11 mm. Appendicolith: None. Mucosal hyper-enhancement: Mild. Extraluminal gas: None. Periappendiceal collection: Small phlegmonous change adjacent to the tip of the appendix without discrete fluid collection. The stomach, small bowel, and colon are unremarkable. No obstruction. Vascular/Lymphatic: Aortic atherosclerosis. No enlarged abdominal or pelvic lymph nodes. Reproductive: Stable prostatomegaly. Other: No free fluid or pneumoperitoneum. Left pelvic hematoma has resolved. Musculoskeletal: No acute or significant osseous findings. Healing left superior and inferior pubic rami fractures. Chronic L3 compression deformity. IMPRESSION: 1. Acute appendicitis. Small phlegmonous change adjacent to the tip may reflect microperforation. No extraluminal gas or discrete periappendiceal fluid collection. 2. Slightly decreased small right trace left pleural effusions. 3. Healing left  superior and inferior pubic rami fractures.  Resolved left pelvic hematoma. 4.  Aortic atherosclerosis (ICD10-I70.0). Electronically Signed   By: Titus Dubin M.D.   On: 04/21/2018 14:33    Daisy Floro, DO 04/23/2018, 8:53 AM PGY-1, [place] Intern pager: [phone], text pages welcome

## 2018-04-24 LAB — PROTIME-INR
INR: 1.12
Prothrombin Time: 14.3 seconds (ref 11.4–15.2)

## 2018-04-24 LAB — CBC
HEMATOCRIT: 35.6 % — AB (ref 39.0–52.0)
HEMOGLOBIN: 11.1 g/dL — AB (ref 13.0–17.0)
MCH: 28.1 pg (ref 26.0–34.0)
MCHC: 31.2 g/dL (ref 30.0–36.0)
MCV: 90.1 fL (ref 80.0–100.0)
Platelets: 152 10*3/uL (ref 150–400)
RBC: 3.95 MIL/uL — AB (ref 4.22–5.81)
RDW: 16 % — AB (ref 11.5–15.5)
WBC: 8.3 10*3/uL (ref 4.0–10.5)
nRBC: 0 % (ref 0.0–0.2)

## 2018-04-24 LAB — BASIC METABOLIC PANEL
ANION GAP: 8 (ref 5–15)
Anion gap: 5 (ref 5–15)
BUN: 9 mg/dL (ref 8–23)
BUN: 7 mg/dL — AB (ref 8–23)
CALCIUM: 8.4 mg/dL — AB (ref 8.9–10.3)
CO2: 25 mmol/L (ref 22–32)
CO2: 25 mmol/L (ref 22–32)
Calcium: 8.4 mg/dL — ABNORMAL LOW (ref 8.9–10.3)
Chloride: 107 mmol/L (ref 98–111)
Chloride: 104 mmol/L (ref 98–111)
Creatinine, Ser: 0.81 mg/dL (ref 0.61–1.24)
Creatinine, Ser: 0.81 mg/dL (ref 0.61–1.24)
GFR calc Af Amer: >60 mL/min (ref >60)
GFR calc non Af Amer: >60 mL/min (ref >60)
GLUCOSE: 132 mg/dL — AB (ref 70–99)
Glucose, Bld: 106 mg/dL — ABNORMAL HIGH (ref 70–99)
POTASSIUM: 3.8 mmol/L (ref 3.5–5.1)
Potassium: 3.6 mmol/L (ref 3.5–5.1)
Sodium: 137 mmol/L (ref 135–145)
Sodium: 137 mmol/L (ref 135–145)

## 2018-04-24 LAB — HEPARIN LEVEL (UNFRACTIONATED)
HEPARIN UNFRACTIONATED: 0.22 [IU]/mL — AB (ref 0.30–0.70)
Heparin Unfractionated: 0.29 IU/mL — ABNORMAL LOW (ref 0.30–0.70)

## 2018-04-24 NOTE — Progress Notes (Addendum)
Wayne [name] for Heparin Indication: atrial fibrillation  Allergies  Allergen Reactions  . Penicillins Rash and Other (See Comments)    PATIENT HAS HAD A PCN REACTION WITH IMMEDIATE RASH, FACIAL/TONGUE/THROAT SWELLING, SOB, OR LIGHTHEADEDNESS WITH HYPOTENSION:  #  #  #  YES  #  #  #   Has patient had a PCN reaction causing severe rash involving mucus membranes or skin necrosis: No Has patient had a PCN reaction that required hospitalization No Has patient had a PCN reaction occurring within the last 10 years: No If all of the above answers are "NO", then may proceed with Cephalosporin use.  . Prednisone Other (See Comments)    Wheezing  . Procaine Hcl Other (See Comments)    Passed out after being given this at dental appointment  . Levofloxacin Rash  . Oxycodone Other (See Comments)    constipation    Patient Measurements: Height: 5' 10.5" (179.1 cm) Weight: 179 lb 10.8 oz (81.5 kg) IBW/kg (Calculated) : 74.15  HDW: 74.8 kg  Vital Signs: Temp: 97.7 F (36.5 C) (11/06 0532) Temp Source: Oral (11/06 0532) BP: 123/60 (11/06 0532) Pulse Rate: 86 (11/06 0532)  Labs: Recent Labs    04/22/18 0541 04/22/18 2131 04/23/18 0159 04/24/18 0157  HGB 10.8*  --  10.7* 11.1*  HCT 34.4*  --  34.7* 35.6*  PLT 145*  --  141* 152  LABPROT 17.5*  --  17.1* 14.3  INR 1.45  --  1.41 1.12  HEPARINUNFRC  --  0.52 0.38 <0.10*  CREATININE 0.81  --  0.91 0.81    Estimated Creatinine Clearance: 68.7 mL/min (by C-G formula based on SCr of 0.81 mg/dL).  Assessment: 84 y.o. M presents with acute appendicitis. Pt on coumadin PTA for afib (admission INR therapeutic) - held and reversed with vit K 10mg  IV 11/3. INR down to 1.12. Current plan for medical management and to continue holding coumadin in case surgery needed. Pharmacy consulted to start heparin bridge.   Heparin level undetectable overnight after previously therapeutic on same rate - RN unaware of  any issues from night shift. Will recheck Stat heparin level this AM and f/u. CBC stable. No bleeding reported.  Goal of Therapy:  Heparin level 0.3-0.7 units/ml Monitor platelets by anticoagulation protocol: Yes   Plan:  Continue heparin gtt at 1050 units/hr for now Stat repeat heparin level Monitor daily heparin level and CBC, s/sx bleeding F/u ability to transition back to coumadin  [name], PharmD, BCPS Clinical Pharmacist Clinical phone [phone] Please check AMION for all [place] contact numbers 04/24/2018 8:56 AM   ADDENDUM: Stat heparin level recheck low at 0.22 but not undetectable. No bleeding or issues with infusion per discussion with RN.  Plan: Increase heparin to 1200 units/hr 8h heparin level Monitor daily heparin level and CBC, s/sx bleeding No surgery planned for now - f/u ability to transition back to warfarin as appropriate  [name], PharmD, BCPS Please check AMION for all [place] contact numbers Clinical Pharmacist 04/24/2018 11:24 AM

## 2018-04-24 NOTE — Progress Notes (Signed)
OT Cancellation Note  Patient Details Name: Joshau Code MRN: [id number] DOB: Aug 13, 1929   Cancelled Treatment:    Reason Eval/Treat Not Completed: Patient declined, no reason specified(patiently declines but ask for later treatment due to fatigu) pt reports "I am just so tired but I will do it later. Can someone walk me in a little bit instead?"    Jeri Modena, OTR/L  Acute Rehabilitation Services Pager: [phone] Office: [phone] .  Parke Poisson B 04/24/2018, 1:39 PM

## 2018-04-24 NOTE — Care Management Important Message (Signed)
Important Message  Patient Details  Name: Wayne [name] MRN: [id number] Date of Birth: 1930-04-16   Medicare Important Message Given:  Yes    [address] 04/24/2018, 2:49 PM

## 2018-04-24 NOTE — Progress Notes (Signed)
ANTICOAGULATION CONSULT NOTE  Pharmacy Consult:  Heparin Indication: atrial fibrillation  Allergies  Allergen Reactions  . Penicillins Rash and Other (See Comments)    PATIENT HAS HAD A PCN REACTION WITH IMMEDIATE RASH, FACIAL/TONGUE/THROAT SWELLING, SOB, OR LIGHTHEADEDNESS WITH HYPOTENSION:  #  #  #  YES  #  #  #   Has patient had a PCN reaction causing severe rash involving mucus membranes or skin necrosis: No Has patient had a PCN reaction that required hospitalization No Has patient had a PCN reaction occurring within the last 10 years: No If all of the above answers are "NO", then may proceed with Cephalosporin use.  . Prednisone Other (See Comments)    Wheezing  . Procaine Hcl Other (See Comments)    Passed out after being given this at dental appointment  . Levofloxacin Rash  . Oxycodone Other (See Comments)    constipation    Patient Measurements: Height: 5' 10.5" (179.1 cm) Weight: 179 lb 10.8 oz (81.5 kg) IBW/kg (Calculated) : 74.15  HDW: 74.8 kg  Vital Signs: Temp: 98.2 F (36.8 C) (11/06 1549) Temp Source: Oral (11/06 1549) BP: 110/59 (11/06 1525) Pulse Rate: 78 (11/06 1525)  Labs: Recent Labs    04/22/18 0541  04/23/18 0159 04/24/18 0157 04/24/18 0925 04/24/18 1948  HGB 10.8*  --  10.7* 11.1*  --   --   HCT 34.4*  --  34.7* 35.6*  --   --   PLT 145*  --  141* 152  --   --   LABPROT 17.5*  --  17.1* 14.3  --   --   INR 1.45  --  1.41 1.12  --   --   HEPARINUNFRC  --    < > 0.38 <0.10* 0.22* 0.29*  CREATININE 0.81  --  0.91 0.81  --   --    < > = values in this interval not displayed.    Estimated Creatinine Clearance: 68.7 mL/min (by C-G formula based on SCr of 0.81 mg/dL).  Assessment: 77 YOM presents with acute appendicitis. Patient was on coumadin PTA for Afib (admission INR therapeutic) - held and reversed with vit K 10mg  IV 11/3. INR down to 1.12. Current plan for medical management and to continue holding coumadin in case surgery needed.  Pharmacy consulted to start heparin bridge.   Heparin level trended up but remains sub-therapeutic.  No issue with heparin infusion per RN.  No bleeding reported.  Goal of Therapy:  Heparin level 0.3-0.7 units/ml Monitor platelets by anticoagulation protocol: Yes   Plan:  Increase heparin gtt to 1300 units/hr F/U AM labs   Kyra Laffey D. Mina Marble, PharmD, BCPS, [place] 04/24/2018, 9:07 PM

## 2018-04-24 NOTE — Progress Notes (Addendum)
Family Medicine Teaching Service Daily Progress Note Intern Pager: [phone]  Patient name: Wayne [name] Medical record number: [id number] Date of birth: 1929/07/10 Age: 84 y.o. Gender: male  Primary Care Provider: [name], DO Consultants:Surgery Code Status:DNR  Pt Overview and Major Events to Date: 11/3 - Admitted to tele with Acute Appendicitis  Mable Lashley Vanderburgis a 84 y.o.malepresenting with acute appendicitis. PMH is significant foratrial fibrillationonchronic anticoagulation,aorticvalve replacement with porcine valve, CAD, CVD, BPH, MI status post PCI to OM without stent, CVA, and hypertension.  Acute appendicitis, stable:CT scan showed acute appendicitis and small phlegmonous change adjacent to the tipthatmay reflect microperforation.WBC within normal limits, patient afebrile, and there is no explicit tenderness to RLQ palpation  -Surgery consulted - patient can have liquids,continue antibiotics, hold Coumadin, initiate vitamin K. INR 2.27 > 1.45 > 1.41 > 1.12 (11/6).  -Cefepime and Flagyl IV -Surgery consulted: recommend clear liquid diet and IV antibiotics through Friday, 11/8, followed by p.o. antibiotics on discharge home barring other medical conditions - Appreciate recs! -Continuous cardiac and pulse ox monitoring -Tylenol PRN pain -AMBMP, CBC, INR -IV normal saline at 100 cc/hourwhen patient is NPO -Remain on clear liquid diet per surgery -Up with assistance -Daily vitals -We will evaluate need for further intervention based on clinical picture, abdominal exam, vitals, and lab values  Shortness of breath: patient had an episode of SOB overnight 11/4 - 11/5 with desatting ito 93% and placed on 2L [place].  At the time, this was felt be potentially due to being fluid overloaded as patient is on IV maintenance fluids as well as consuming liquids PO.  Fluids were stopped, although he was not fluid overloaded on physical exam 11/4 (no lower  extremity edema to calves or thighs).  A similar event happened again overnight 11/5-11/4 with reported desatted during.  However, there are no records of the satting below 96% in the vitals.  It is known that the patient has interstitial lung disease. - d/c IV fluids - Continue PO fluid intake ad lib - Continue lasix - Do not use supplemental oxygen unless saturation on room air less than 92%.  Atrial fibrillationchronic, stable:Patient in A. fib on physical exam.Managed with digoxin 125 mcg daily and Lopressor 12.5 mg twice daily. Rate is well controlled. Treated with Coumadin. On heparin. 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 Treated at home with warfarin. INR 2.27 > 1.41 > 1.12 (11/6). On heparin. -Hold Coumadin -Strict I&Os  Hx CVA with balance deficit, chronic, stable: Cerebellar stroke in2007resulted in residualimbalanceproblems.  -Monitor -ambulate with assistance  Constipation, chronic:Patient has a history of constipation, however usually has BM once daily.  Patient most recently had a bowel movement on 11/4 and again on 11/5. -ContinuehomeSenokot daily, Colace daily, and MiraLAX twice daily as needed  Mild neurocognitive decline:Patient has experienced mild neurocognitive decline and short-term memory loss since aortic valve replacement in March 2019. On exam in the emergency department, patient is neurologically intact and alert and oriented x3. -Monitor -Keep patient oriented during the day by displaying time, day, date, keeping room well lit during the day and dark at night.  BPH, chronic, stable:Patient reports having urinary frequency due to BPH. Takes finasteride 5 mg daily and alfuzosin 10 mg daily. -Continue homemeds  FEN/GI: Full liquid diet, Senokot, Colace, MiraLAX; melatonin 6 mg for  sleep Prophylaxis:Heparin  Disposition:Admit to telemetry  Subjective:  Patient seen this morning  lying in bed with nasal cannula in place, stating he was short of breath last night.  He then stood up to ambulate to the bathroom.  He is using a walker well with minimal assistance.  He states he feels better than yesterday and denies nausea, vomiting, abdominal pain, and constipation.  He would like to know when he can go home.  Objective: Temp:  [97.5 F (36.4 C)-97.7 F (36.5 C)] 97.7 F (36.5 C) (11/06 0532) Pulse Rate:  [81-89] 86 (11/06 0532) Resp:  [17-18] 17 (11/06 0532) BP: (123-133)/(60-72) 123/60 (11/06 0532) SpO2:  [95 %-98 %] 98 % (11/06 0532)  Physical Exam  Constitutional: He is oriented to person, place, and time. He appears well-developed and well-nourished.  Non-toxic appearance. No distress.  HENT:  Head: Normocephalic.  Eyes: EOM are normal.  Cardiovascular: Normal rate and intact distal pulses.  No murmur heard. Irregularly irregular rhythm, chronic, stable, rate controlled  Pulmonary/Chest: Effort normal.  Minimal rales to bilateral lung bases, otherwise CTAB  Abdominal: Normal appearance and bowel sounds are normal. He exhibits no distension and no ascites. There is no tenderness. There is no rebound, no guarding and no tenderness at McBurney's point. No hernia.  Tenderness elicited with palpation to lateral RLQ.  Neurological: He is alert and oriented to person, place, and time.  Skin: Skin is warm and dry.  Psychiatric: He has a normal mood and affect. His behavior is normal.   Laboratory: Recent Labs  Lab 04/22/18 0541 04/23/18 0159 04/24/18 0157  WBC 5.5 5.3 8.3  HGB 10.8* 10.7* 11.1*  HCT 34.4* 34.7* 35.6*  PLT 145* 141* 152   Recent Labs  Lab 04/21/18 1213 04/22/18 0541 04/23/18 0159 04/24/18 0157  NA 138 138 138 137  K 3.5 3.3* 3.7 3.8  CL 102 109 111 107  CO2 28 25 22 25   BUN 12 8 11 9   CREATININE 1.06 0.81 0.91 0.81  CALCIUM  8.6* 8.2* 8.3* 8.4*  PROT 6.1*  --   --   --   BILITOT 0.8  --   --   --   ALKPHOS 49  --   --   --   ALT 10  --   --   --   AST 18  --   --   --   GLUCOSE 157* 90 94 106*   Output from 11/5-11/6: 1,429mL Input from 11/5-11/6: 762  Imaging/Diagnostic Tests: Dg Chest 2 View  Result Date: 04/23/2018 CLINICAL DATA:  Dyspnea.  Nonproductive cough. EXAM: CHEST - 2 VIEW COMPARISON:  Chest x-rays dated 01/21/2018 and 09/10/2017 FINDINGS: There are small bilateral pleural effusions with slight accentuation of the interstitial markings with peribronchial thickening, possibly representing mild pulmonary edema. Pulmonary vascularity is at the upper limits of normal but increased since the prior study. TAVR.  Aortic atherosclerosis.  No acute bone abnormality. IMPRESSION: 1. Probable mild bilateral pulmonary edema with small pleural effusions. 2.  Aortic Atherosclerosis (ICD10-I70.0). Electronically Signed   By: [name] M.D.   On: 04/23/2018 10:25   Ct Abdomen Pelvis W Contrast  Result Date: 04/21/2018 CLINICAL DATA:  Abdominal pain. EXAM: CT ABDOMEN AND PELVIS WITH CONTRAST TECHNIQUE: Multidetector CT imaging of the abdomen and pelvis was performed using the standard protocol following bolus administration of intravenous contrast. CONTRAST:  155mL OMNIPAQUE IOHEXOL 300 MG/ML  SOLN COMPARISON:  CT abdomen pelvis dated March 23, 2018. FINDINGS: Lower chest: Small right and trace left pleural effusions, slightly decreased when compared to prior study. Bilateral lower  lobe subsegmental atelectasis. Hepatobiliary: No focal liver abnormality is seen. No gallstones, gallbladder wall thickening, or biliary dilatation. Pancreas: Unremarkable. No pancreatic ductal dilatation or surrounding inflammatory changes. Spleen: Normal in size without focal abnormality. Adrenals/Urinary Tract: The adrenal glands are unremarkable. Unchanged left renal simple cyst. No renal or ureteral calculi. No hydronephrosis. Unchanged  left posterolateral bladder diverticulum. No bladder wall thickening. Stomach/Bowel: Dilatation of the distal appendix with prominent distal periappendiceal inflammatory changes, consistent with acute appendicitis. Appendix: Location: Right lower quadrant, inferior to the cecum. Diameter: 11 mm. Appendicolith: None. Mucosal hyper-enhancement: Mild. Extraluminal gas: None. Periappendiceal collection: Small phlegmonous change adjacent to the tip of the appendix without discrete fluid collection. The stomach, small bowel, and colon are unremarkable. No obstruction. Vascular/Lymphatic: Aortic atherosclerosis. No enlarged abdominal or pelvic lymph nodes. Reproductive: Stable prostatomegaly. Other: No free fluid or pneumoperitoneum. Left pelvic hematoma has resolved. Musculoskeletal: No acute or significant osseous findings. Healing left superior and inferior pubic rami fractures. Chronic L3 compression deformity. IMPRESSION: 1. Acute appendicitis. Small phlegmonous change adjacent to the tip may reflect microperforation. No extraluminal gas or discrete periappendiceal fluid collection. 2. Slightly decreased small right trace left pleural effusions. 3. Healing left superior and inferior pubic rami fractures. Resolved left pelvic hematoma. 4.  Aortic atherosclerosis (ICD10-I70.0). Electronically Signed   By: Titus Dubin M.D.   On: 04/21/2018 14:33    Daisy Floro, DO 04/24/2018, 8:48 AM PGY-1, [place] Intern pager: [phone], text pages welcome

## 2018-04-24 NOTE — Progress Notes (Signed)
Pharmacy Antibiotic Note  Wayne [name] is a 84 y.o. male admitted on 04/21/2018 with appendicitis without abscess or perforation. Pharmacy has been consulted for cefepime dosing, patient also receiving metronidazole dosed appropriately per MD. Abx D#4. WBC WNL, AF. Scr stable WNL.  Plan: Cefepime 2g IV q12h Continue metronidazole 500mg  IV q8h F/u clinical status, renal function, surgery plans, LOT  Height: 5' 10.5" (179.1 cm) Weight: 179 lb 10.8 oz (81.5 kg) IBW/kg (Calculated) : 74.15  Temp (24hrs), Avg:97.6 F (36.4 C), Min:97.5 F (36.4 C), Max:97.7 F (36.5 C)  Recent Labs  Lab 04/21/18 1213 04/22/18 0541 04/23/18 0159 04/24/18 0157  WBC 7.3 5.5 5.3 8.3  CREATININE 1.06 0.81 0.91 0.81    Estimated Creatinine Clearance: 68.7 mL/min (by C-G formula based on SCr of 0.81 mg/dL).    Allergies  Allergen Reactions  . Penicillins Rash and Other (See Comments)    PATIENT HAS HAD A PCN REACTION WITH IMMEDIATE RASH, FACIAL/TONGUE/THROAT SWELLING, SOB, OR LIGHTHEADEDNESS WITH HYPOTENSION:  #  #  #  YES  #  #  #   Has patient had a PCN reaction causing severe rash involving mucus membranes or skin necrosis: No Has patient had a PCN reaction that required hospitalization No Has patient had a PCN reaction occurring within the last 10 years: No If all of the above answers are "NO", then may proceed with Cephalosporin use.  . Prednisone Other (See Comments)    Wheezing  . Procaine Hcl Other (See Comments)    Passed out after being given this at dental appointment  . Levofloxacin Rash  . Oxycodone Other (See Comments)    constipation   Antimicrobials this admission: Cefepime 11/3 >> Metronidazole 11/3 >>  Microbiology results: None  [name], PharmD, BCPS Clinical Pharmacist Clinical phone [phone] Please check AMION for all [place] contact numbers 04/24/2018 11:33 AM

## 2018-04-24 NOTE — Progress Notes (Signed)
CC: Abdominal pain  Subjective: Patient appears fairly comfortable in bed.  Still on O2.  Still has some rales in his bases.  He still has some tenderness in his right lower quadrant, but he is exam is variable.  Someone came in last night, made him n.p.o., and told him he might have surgery today.  I will work on sorting this out.  Objective: Vital signs in last 24 hours: Temp:  [97.5 F (36.4 C)-97.7 F (36.5 C)] 97.7 F (36.5 C) (11/06 0532) Pulse Rate:  [81-89] 86 (11/06 0532) Resp:  [17-18] 17 (11/06 0532) BP: (123-133)/(60-72) 123/60 (11/06 0532) SpO2:  [95 %-98 %] 98 % (11/06 0532) Last BM Date: 04/22/18 562 PO 200 IV 1450 urine Stool x 2 Afebrile, VSS Labs are good, WBC 8.3 H/H OK, BMP OK CT 11/03 CXR11/5:    Probable mild bilateral pulmonary edema with small pleural effusions    Intake/Output from previous day: 11/05 0701 - 11/06 0700 In: 762 [P.O.:562; IV Piggyback:200] Out: 1450 [Urine:1450] Intake/Output this shift: No intake/output data recorded.  General appearance: alert, cooperative and no distress Resp: Rales in the bases.  Still on O2, he is not wheezing this a.m. GI: Soft, positive bowel sounds, positive BM.  He still has some tenderness in his right lower quadrant but this is variable on exam.  Lab Results:  Recent Labs    04/23/18 0159 04/24/18 0157  WBC 5.3 8.3  HGB 10.7* 11.1*  HCT 34.7* 35.6*  PLT 141* 152    BMET Recent Labs    04/23/18 0159 04/24/18 0157  NA 138 137  K 3.7 3.8  CL 111 107  CO2 22 25  GLUCOSE 94 106*  BUN 11 9  CREATININE 0.91 0.81  CALCIUM 8.3* 8.4*   PT/INR Recent Labs    04/23/18 0159 04/24/18 0157  LABPROT 17.1* 14.3  INR 1.41 1.12    Recent Labs  Lab 04/21/18 1213  AST 18  ALT 10  ALKPHOS 49  BILITOT 0.8  PROT 6.1*  ALBUMIN 3.4*     Lipase     Component Value Date/Time   LIPASE 35 04/21/2018 1213     Medications: . alfuzosin  10 mg Oral Q breakfast  . cholecalciferol  2,000  Units Oral Daily  . digoxin  125 mcg Oral Daily  . docusate sodium  100 mg Oral QPM  . finasteride  5 mg Oral QHS  . guaiFENesin  600 mg Oral BID  . Melatonin  6 mg Oral QHS  . metoprolol tartrate  12.5 mg Oral BID  . polyethylene glycol  17 g Oral BID  . senna  2 tablet Oral Daily   . sodium chloride 10 mL/hr at 04/23/18 1049  . ceFEPime (MAXIPIME) IV 2 g (04/23/18 2159)  . heparin 1,050 Units/hr (04/23/18 1041)  . metronidazole 500 mg (04/24/18 0031)   Anti-infectives (From admission, onward)   Start     Dose/Rate Route Frequency Ordered Stop   04/21/18 2200  ceFEPIme (MAXIPIME) 2 g in sodium chloride 0.9 % 100 mL IVPB     2 g 200 mL/hr over 30 Minutes Intravenous Every 12 hours 04/21/18 1600     04/21/18 1630  metroNIDAZOLE (FLAGYL) IVPB 500 mg     500 mg 100 mL/hr over 60 Minutes Intravenous Every 8 hours 04/21/18 1549     04/21/18 1445  ceFEPIme (MAXIPIME) 2 g in sodium chloride 0.9 % 100 mL IVPB     2 g 200 mL/hr over  30 Minutes Intravenous  Once 04/21/18 1444 04/22/18 0812   04/21/18 1445  metroNIDAZOLE (FLAGYL) IVPB 500 mg  Status:  Discontinued     500 mg 100 mL/hr over 60 Minutes Intravenous  Once 04/21/18 1444 04/21/18 1558      Assessment/Plan Aortic stenosis with TAVR Atrial fibrillation, chronic Hx CVA Chronic anticoagulation-INR2.27, Vitamin K 10 mg >>1.45 today Hypertension BPH Hypokalemia- defer to Medicine  Acute appendicitis  - Planning medical management  FEN: IV fluids/NPO ID: Cefepime/Flagyl 11/3 =>>day 4 DVT: Heparin per pharmacy  -1050 units/h Follow up: TBD  Plan: Continue IV antibiotics, he has PRN Lasix order, and I asked the nursing staff to give him a dose this a.m.  He received 1 dose of 10 mg of Lasix p.o. yesterday that I ordered in the afternoon.  Will review with Dr. Windle Guard and teaching service.  I am going to place him back on clears, with no expectation of surgery unless he becomes more symptomatic.      LOS: 3 days     Wayne [name] 04/24/2018 [id number]

## 2018-04-24 NOTE — Care Management Note (Signed)
Case Management Note  Patient Details  Name: Wayne [name] MRN: [id number] Date of Birth: June 05, 1930  Subjective/Objective:                    Action/Plan:   Expected Discharge Date:                  Expected Discharge Plan:  [place]  In-House Referral:     Discharge planning Services  CM Consult  Post Acute Care Choice:  Durable Medical Equipment, Home Health Choice offered to:  Patient, Spouse  DME Arranged:  N/A DME Agency:  NA  HH Arranged:  PT, OT HH Agency:  [place]  Status of Service:  In process, will continue to follow  If discussed at Long Length of Stay Meetings, dates discussed:    Additional Comments:  Marilu Favre, RN 04/24/2018, 2:35 PM

## 2018-04-25 LAB — CBC
HCT: 36.7 % — ABNORMAL LOW (ref 39.0–52.0)
Hemoglobin: 11.4 g/dL — ABNORMAL LOW (ref 13.0–17.0)
MCH: 28.3 pg (ref 26.0–34.0)
MCHC: 31.1 g/dL (ref 30.0–36.0)
MCV: 91.1 fL (ref 80.0–100.0)
NRBC: 0 % (ref 0.0–0.2)
PLATELETS: 161 10*3/uL (ref 150–400)
RBC: 4.03 MIL/uL — AB (ref 4.22–5.81)
RDW: 16.3 % — ABNORMAL HIGH (ref 11.5–15.5)
WBC: 6.9 10*3/uL (ref 4.0–10.5)

## 2018-04-25 LAB — HEPARIN LEVEL (UNFRACTIONATED): Heparin Unfractionated: 0.34 IU/mL (ref 0.30–0.70)

## 2018-04-25 MED ORDER — CEFDINIR 300 MG PO CAPS
300.0000 mg | ORAL_CAPSULE | Freq: Two times a day (BID) | ORAL | Status: DC
  Administered 2018-04-25 – 2018-04-26 (×3): 300 mg via ORAL
  Filled 2018-04-25 (×3): qty 1

## 2018-04-25 MED ORDER — WARFARIN SODIUM 2.5 MG PO TABS
2.5000 mg | ORAL_TABLET | Freq: Once | ORAL | Status: AC
  Administered 2018-04-25: 2.5 mg via ORAL
  Filled 2018-04-25: qty 1

## 2018-04-25 MED ORDER — METRONIDAZOLE 500 MG PO TABS
500.0000 mg | ORAL_TABLET | Freq: Three times a day (TID) | ORAL | Status: DC
  Administered 2018-04-25 – 2018-04-26 (×5): 500 mg via ORAL
  Filled 2018-04-25 (×5): qty 1

## 2018-04-25 MED ORDER — ADULT MULTIVITAMIN W/MINERALS CH
1.0000 | ORAL_TABLET | Freq: Every day | ORAL | Status: DC
  Administered 2018-04-26: 1 via ORAL
  Filled 2018-04-25: qty 1

## 2018-04-25 MED ORDER — WARFARIN - PHARMACIST DOSING INPATIENT: Freq: Every day | Status: DC

## 2018-04-25 NOTE — Progress Notes (Signed)
Family Medicine Teaching Service Daily Progress Note Intern Pager: [phone]  Patient name: Wayne [name] Medical record number: [id number] Date of birth: August 03, 1929 Age: 84 y.o. Gender: male  Primary Care Provider: [name], DO Consultants:Surgery Code Status:DNR  Pt Overview and Major Events to Date: 11/3 - Admitted to tele with Acute Appendicitis  Wallace Gappa Vanderburgis a 84 y.o.malepresenting with acute appendicitis. PMH is significant foratrial fibrillationonchronic anticoagulation,aorticvalve replacement with porcine valve, CAD, CVD, BPH, MI status post PCI to OM without stent, CVA, and hypertension.  Acute appendicitis, stable:CT scan showed acute appendicitis and small phlegmonous change adjacent to the tipthatmay reflect microperforation.WBCwithin normal limits, patient afebrile, and there is no explicit tenderness toRLQpalpation  -Surgery consulted - patient can have liquids,continueantibiotics,hold Coumadin, initiate vitamin K. INR 2.27 >1.45 >1.41 > 1.12 (11/6). -Cefepime and Flagyl IV -Surgery consulted: recommend clear liquid diet and IV antibiotics through Friday, 11/8, followed by p.o. antibiotics on discharge home barring other medical conditions - Appreciate recs! -Continuous cardiac and pulse ox monitoring -Tylenol PRN pain -AMBMP, CBC, INR -IV normal saline at 100 cc/hourwhen patient is NPO -Remain on clear liquid diet per surgery -Up with assistance -Daily vitals -We will evaluate need for further intervention based on clinical picture, abdominal exam, vitals, and lab values  Shortness of breath:patient had an episode of SOB overnight 11/4 - 11/5 with desatting ito 93% and placed on 2L [place].  At the time, this was felt be potentially due to being fluid overloaded as patient is on IV maintenance fluids as well as consuming liquids PO.  Fluids were stopped, although he was not fluid overloaded on physical exam 11/4 (no lower  extremity edema to calves or thighs).  A similar event happened again overnight 11/5-11/4 with reported desatted during.  However, there are no records of the satting below 96% in the vitals.  It is known that the patient has interstitial lung disease. - d/c IV fluids - Continue PO fluid intake ad lib - Continue lasix - Do not use supplemental oxygen unless saturation on room air less than 92%.  Atrial fibrillationchronic, stable:Patient in A. fib on physical exam.Managed with digoxin 125 mcg daily and Lopressor 12.5 mg twice daily. Rate is well controlled. Treated with Coumadin. On heparin. 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 Treated at home with warfarin. INR2.27 > 1.41 > 1.12 (11/6).On heparin. -Hold Coumadin -Strict I&Os  Hx CVA with balance deficit, chronic, stable: Cerebellar stroke in2007resulted in residualimbalanceproblems.  -Monitor -ambulate with assistance  Constipation, chronic:Patient has a history of constipation, however usually has BM once daily.Patient most recently had a bowel movement on 11/4 and again on 11/5. -ContinuehomeSenokot daily, Colace daily, and MiraLAX twice daily as needed  Mild neurocognitive decline:Patient has experienced mild neurocognitive decline and short-term memory loss since aortic valve replacement in March 2019. On exam in the emergency department, patient is neurologically intact and alert and oriented x3. -Monitor -Keep patient oriented during the day by displaying time, day, date, keeping room well lit during the day and dark at night.  BPH, chronic, stable:Patient reports having urinary frequency due to BPH. Takes finasteride 5 mg daily and alfuzosin 10 mg daily. -Continue homemeds  FEN/GI:Full liquid diet, Senokot, Colace, MiraLAX; melatonin 6 mg for  sleep Prophylaxis:Heparin  Disposition:Admit to telemetry  Subjective:  Patient seen this morning sitting up right in bed and eating breakfast. He denies nausea, vomiting, constipation, and  abdominal pain. He is asking when he can go home. He has no other concerns at this time.   Objective: Temp:  [98.2 F (36.8 C)-99.2 F (37.3 C)] 98.5 F (36.9 C) (11/07 0758) Pulse Rate:  [68-99] 99 (11/07 0758) Resp:  [14-18] 16 (11/07 0758) BP: (92-127)/(57-76) 127/73 (11/07 0758) SpO2:  [93 %-98 %] 98 % (11/07 0758)  Physical Exam  Constitutional: He is oriented to person, place, and time. He appears well-developed and well-nourished.  Non-toxic appearance. No distress.  HENT:  Head: Normocephalic.  Eyes: EOM are normal.  Cardiovascular: Normal rate and intact distal pulses.  No murmur heard. Irregularly irregular rhythm, chronic, stable, rate controlled  Pulmonary/Chest: Effort normal.  Minimal rales to bilateral lung bases, otherwise CTAB  Abdominal: Normal appearance and bowel sounds are normal. He exhibits no distension and no ascites. There is no tenderness. There is no rebound, no guarding and no tenderness at McBurney's point. No hernia.  Tenderness elicited with palpation to lateral RLQ.  Neurological: He is alert and oriented to person, place, and time.  Skin: Skin is warm and dry.  Psychiatric: He has a normal mood and affect. His behavior is normal.     Laboratory: Recent Labs  Lab 04/23/18 0159 04/24/18 0157 04/25/18 0208  WBC 5.3 8.3 6.9  HGB 10.7* 11.1* 11.4*  HCT 34.7* 35.6* 36.7*  PLT 141* 152 161   Recent Labs  Lab 04/21/18 1213  04/23/18 0159 04/24/18 0157 04/24/18 1948  NA 138   < > 138 137 137  K 3.5   < > 3.7 3.8 3.6  CL 102   < > 111 107 104  CO2 28   < > 22 25 25   BUN 12   < > 11 9 7*  CREATININE 1.06   < > 0.91 0.81 0.81  CALCIUM 8.6*   < > 8.3* 8.4* 8.4*  PROT 6.1*  --   --   --   --   BILITOT 0.8  --   --   --   --   ALKPHOS 49  --   --    --   --   ALT 10  --   --   --   --   AST 18  --   --   --   --   GLUCOSE 157*   < > 94 106* 132*   < > = values in this interval not displayed.    Imaging/Diagnostic Tests: Dg Chest 2 View  Result Date: 04/23/2018 CLINICAL DATA:  Dyspnea.  Nonproductive cough. EXAM: CHEST - 2 VIEW COMPARISON:  Chest x-rays dated 01/21/2018 and 09/10/2017 FINDINGS: There are small bilateral pleural effusions with slight accentuation of the interstitial markings with peribronchial thickening, possibly representing mild pulmonary edema. Pulmonary vascularity is at the upper limits of normal but increased since the prior study. TAVR.  Aortic atherosclerosis.  No acute bone abnormality. IMPRESSION: 1. Probable mild bilateral pulmonary edema with small pleural effusions. 2.  Aortic Atherosclerosis (ICD10-I70.0). Electronically Signed   By: [name] M.D.   On: 04/23/2018 10:25   Ct Abdomen Pelvis W Contrast  Result Date: 04/21/2018 CLINICAL DATA:  Abdominal pain. EXAM: CT ABDOMEN AND PELVIS WITH CONTRAST TECHNIQUE: Multidetector CT imaging of the abdomen and pelvis was performed using the standard protocol following bolus administration of intravenous contrast. CONTRAST:  112mL OMNIPAQUE IOHEXOL 300 MG/ML  SOLN COMPARISON:  CT abdomen pelvis dated March 23, 2018. FINDINGS: Lower chest: Small right and trace left pleural effusions,  slightly decreased when compared to prior study. Bilateral lower lobe subsegmental atelectasis. Hepatobiliary: No focal liver abnormality is seen. No gallstones, gallbladder wall thickening, or biliary dilatation. Pancreas: Unremarkable. No pancreatic ductal dilatation or surrounding inflammatory changes. Spleen: Normal in size without focal abnormality. Adrenals/Urinary Tract: The adrenal glands are unremarkable. Unchanged left renal simple cyst. No renal or ureteral calculi. No hydronephrosis. Unchanged left posterolateral bladder diverticulum. No bladder wall thickening. Stomach/Bowel:  Dilatation of the distal appendix with prominent distal periappendiceal inflammatory changes, consistent with acute appendicitis. Appendix: Location: Right lower quadrant, inferior to the cecum. Diameter: 11 mm. Appendicolith: None. Mucosal hyper-enhancement: Mild. Extraluminal gas: None. Periappendiceal collection: Small phlegmonous change adjacent to the tip of the appendix without discrete fluid collection. The stomach, small bowel, and colon are unremarkable. No obstruction. Vascular/Lymphatic: Aortic atherosclerosis. No enlarged abdominal or pelvic lymph nodes. Reproductive: Stable prostatomegaly. Other: No free fluid or pneumoperitoneum. Left pelvic hematoma has resolved. Musculoskeletal: No acute or significant osseous findings. Healing left superior and inferior pubic rami fractures. Chronic L3 compression deformity. IMPRESSION: 1. Acute appendicitis. Small phlegmonous change adjacent to the tip may reflect microperforation. No extraluminal gas or discrete periappendiceal fluid collection. 2. Slightly decreased small right trace left pleural effusions. 3. Healing left superior and inferior pubic rami fractures. Resolved left pelvic hematoma. 4.  Aortic atherosclerosis (ICD10-I70.0). Electronically Signed   By: Titus Dubin M.D.   On: 04/21/2018 14:33     Daisy Floro, DO 04/25/2018, 8:37 AM PGY-1, [place] Intern pager: [phone], text pages welcome

## 2018-04-25 NOTE — Discharge Summary (Signed)
[facility] Discharge Summary  Patient name: Wayne [name] Medical record number: [id number] Date of birth: 1929/12/29 Age: 84 y.o. Gender: male Date of Admission: 04/21/2018  Date of Discharge: 04/26/2018 Admitting Physician: Kathrene Alu, MD  Primary Care Provider: [name], DO Consultants: Surgery  Indication for Hospitalization: Abdominal pain secondary to acute appendicitis  Discharge Diagnoses/Problem List:  Atrial fibrillation on chronic anticoagulation Aortic valve replacement with porcine valve Coronary artery disease BPH MI status post PCI to OM without stent History of CVA Hypertension  Disposition: Discharge home  Discharge Condition: Stable  Discharge Exam:  Physical Exam  Constitutional: He is oriented to person, place, and time and well-developed, well-nourished, and in no distress. No distress.  HENT:  Head: Normocephalic.  Eyes: EOM are normal.  Cardiovascular: Normal rate, normal heart sounds and intact distal pulses.  No murmur heard. Irregularly irregular rhythm, chronic, stable, rate well controlled  Pulmonary/Chest: Effort normal and breath sounds normal. No respiratory distress.  Abdominal: Soft. Bowel sounds are normal. He exhibits no mass. There is tenderness (Minimal, improving tenderness to palpation of right lower quadrant). There is no rebound.  Musculoskeletal: Normal range of motion. He exhibits no edema.  Neurological: He is alert and oriented to person, place, and time.  Skin: Skin is warm.  Psychiatric: Mood, memory, affect and judgment normal.   Brief Hospital Course:  Urban Naval is an 84 year old male admitted to the hospital with abdominal pain found to be due to acute appendicitis via abdominal CT scan.  Surgery was consulted.  Due to the patient being on warfarin for atrial fibrillation, and in preparation of potential abdominal surgery, 10 mg of vitamin K was administered and  the patient was started on heparin.  Surgery opted to manage the patient medically with IV antibiotics, cefepime and Flagyl.  The patient's symptoms waxed and waned throughout hospitalization but overall improved.  The patient's vitals were monitored throughout hospitalization and he stayed stable.  The patient's warfarin was restarted the night before discharge per pharmacy recommendations for atrial fibrillation and high CHADSVASc Score. He was medically cleared for discharge on 04/26/2018 on p.o. cefdinir and Flagyl with close outpatient follow-up.  Issues for Follow Up:  1. Continue entire course of Cefdinir and Flagyl for acute appendicitis.  Significant Procedures: None  Significant Labs and Imaging:  Recent Labs  Lab 04/23/18 0159 04/24/18 0157 04/25/18 0208  WBC 5.3 8.3 6.9  HGB 10.7* 11.1* 11.4*  HCT 34.7* 35.6* 36.7*  PLT 141* 152 161   Recent Labs  Lab 04/21/18 1213 04/22/18 0541 04/23/18 0159 04/24/18 0157 04/24/18 1948  NA 138 138 138 137 137  K 3.5 3.3* 3.7 3.8 3.6  CL 102 109 111 107 104  CO2 28 25 22 25 25   GLUCOSE 157* 90 94 106* 132*  BUN 12 8 11 9  7*  CREATININE 1.06 0.81 0.91 0.81 0.81  CALCIUM 8.6* 8.2* 8.3* 8.4* 8.4*  ALKPHOS 49  --   --   --   --   AST 18  --   --   --   --   ALT 10  --   --   --   --   ALBUMIN 3.4*  --   --   --   --    Results/Tests Pending at Time of Discharge: None  Discharge Medications:  Allergies as of 04/26/2018      Reactions   Penicillins Rash, Other (See Comments)   PATIENT HAS HAD A PCN  REACTION WITH IMMEDIATE RASH, FACIAL/TONGUE/THROAT SWELLING, SOB, OR LIGHTHEADEDNESS WITH HYPOTENSION:  #  #  #  YES  #  #  #   Has patient had a PCN reaction causing severe rash involving mucus membranes or skin necrosis: No Has patient had a PCN reaction that required hospitalization No Has patient had a PCN reaction occurring within the last 10 years: No If all of the above answers are "NO", then may proceed with Cephalosporin use.    Prednisone Other (See Comments)   Wheezing   Procaine Hcl Other (See Comments)   Passed out after being given this at dental appointment   Levofloxacin Rash   Oxycodone Other (See Comments)   constipation      Medication List    TAKE these medications   acetaminophen 500 MG tablet Commonly known as:  TYLENOL Take 1,000 mg by mouth every 6 (six) hours as needed for moderate pain or headache.   alfuzosin 10 MG 24 hr tablet Commonly known as:  UROXATRAL Take 10 mg by mouth daily with breakfast.   ARTIFICIAL TEAR OP Place 1 drop into both eyes daily as needed (dry eyes).   BIOFREEZE EX Apply 1 application topically 3 (three) times daily as needed (muscle pain in back). alterrnates between [employer] and [employer]   BLUE-EMU MAXIMUM STRENGTH EX Apply 1 application topically 3 (three) times daily as needed (muscle pain in back). alterrnates between [employer] and [employer]   cefdinir 300 MG capsule Commonly known as:  OMNICEF Take 1 capsule (300 mg total) by mouth every 12 (twelve) hours for 5 days.   Cholecalciferol 50 MCG (2000 UT) Tabs Take 1 tablet (2,000 Units total) by mouth daily.   digoxin 0.125 MG tablet Commonly known as:  LANOXIN Take 1 tablet (125 mcg total) by mouth daily.   docusate sodium 100 MG capsule Commonly known as:  COLACE Take 100 mg by mouth every evening.   enoxaparin 80 MG/0.8ML injection Commonly known as:  LOVENOX Inject 0.8 mLs (80 mg total) into the skin every 12 (twelve) hours for 7 days. Stop taking when patient's INR is therapeutic (2-3) x 2.   finasteride 5 MG tablet Commonly known as:  PROSCAR Take 5 mg by mouth at bedtime.   furosemide 40 MG tablet Commonly known as:  LASIX Take 0.5 tablets (20 mg total) by mouth 2 (two) times daily as needed for fluid. What changed:  Another medication with the same name was changed. Make sure you understand how and when to take each.   furosemide 20 MG tablet Commonly known as:  LASIX TAKE 2  TABLETS BY MOUTH ONCE DAILY What changed:    how much to take  when to take this  additional instructions   Melatonin 10 MG Tabs Take 10 mg by mouth at bedtime.   metoprolol tartrate 25 MG tablet Commonly known as:  LOPRESSOR Take 0.5 tablets (12.5 mg total) by mouth 2 (two) times daily. What changed:    how much to take  additional instructions   metroNIDAZOLE 500 MG tablet Commonly known as:  FLAGYL Take 1 tablet (500 mg total) by mouth every 8 (eight) hours for 5 days.   multivitamin with minerals Tabs tablet Take 1 tablet by mouth daily.   polyethylene glycol packet Commonly known as:  MIRALAX / GLYCOLAX Take 17 g by mouth 2 (two) times daily. What changed:    when to take this  reasons to take this   potassium chloride 10 MEQ  tablet Commonly known as:  K-DUR,KLOR-CON Take 1 tablet (10 mEq total) by mouth daily.   potassium chloride 10 MEQ tablet Commonly known as:  K-DUR Take 5 mEq by mouth daily as needed for fluid. TAKE POTASSIUM IF YOU TAKE LASIX   senna 8.6 MG Tabs tablet Commonly known as:  SENOKOT Take 2 tablets (17.2 mg total) by mouth daily.   sodium chloride 0.65 % Soln nasal spray Commonly known as:  OCEAN Place 1 spray into both nostrils daily as needed for congestion.   triamcinolone cream 0.1 % Commonly known as:  KENALOG Apply 1 application topically daily as needed (dry skin).   warfarin 2.5 MG tablet Commonly known as:  COUMADIN Take as directed. If you are unsure how to take this medication, talk to your nurse or doctor. Original instructions:  TAKE 1 TO 2 TABLETS BY MOUTH ONCE DAILY AS  DIRECTED  BY  COUMADIN  CLINIC What changed:    See the new instructions.  Another medication with the same name was removed. Continue taking this medication, and follow the directions you see here.       Discharge Instructions: Please refer to Patient Instructions section of EMR for full details.  Patient was counseled important signs and  symptoms that should prompt return to medical care, changes in medications, dietary instructions, activity restrictions, and follow up appointments.   Follow-Up Appointments: [facility] Follow up.   Contact information: [place] [address] [phone]           Daisy Floro, DO 04/26/2018, 12:25 PM PGY-1, [place]

## 2018-04-25 NOTE — Progress Notes (Signed)
PT Cancellation Note  Patient Details Name: Wayne [name] MRN: [id number] DOB: Nov 14, 1929   Cancelled Treatment:    Reason Eval/Treat Not Completed: Patient declined, no reason specified Pt's wife and pt declined participation in therapy and report pt is waiting to get a bath and that he would like to "save energy" to get a bath. Pt and pt's wife verbalize understanding the importance of increased mobility and need for ambulation but decline at this time.    Salina April, PTA Acute Rehabilitation Services Pager: [phone] Office: [phone]   04/25/2018, 11:19 AM

## 2018-04-25 NOTE — Progress Notes (Addendum)
[place] for Heparin>warfarin Indication: atrial fibrillation  Allergies  Allergen Reactions  . Penicillins Rash and Other (See Comments)    PATIENT HAS HAD A PCN REACTION WITH IMMEDIATE RASH, FACIAL/TONGUE/THROAT SWELLING, SOB, OR LIGHTHEADEDNESS WITH HYPOTENSION:  #  #  #  YES  #  #  #   Has patient had a PCN reaction causing severe rash involving mucus membranes or skin necrosis: No Has patient had a PCN reaction that required hospitalization No Has patient had a PCN reaction occurring within the last 10 years: No If all of the above answers are "NO", then may proceed with Cephalosporin use.  . Prednisone Other (See Comments)    Wheezing  . Procaine Hcl Other (See Comments)    Passed out after being given this at dental appointment  . Levofloxacin Rash  . Oxycodone Other (See Comments)    constipation    Patient Measurements: Height: 5' 10.5" (179.1 cm) Weight: 179 lb 10.8 oz (81.5 kg) IBW/kg (Calculated) : 74.15  HDW: 74.8 kg  Vital Signs: Temp: 98.5 F (36.9 C) (11/07 0758) Temp Source: Oral (11/07 0758) BP: 127/73 (11/07 0758) Pulse Rate: 99 (11/07 0758)  Labs: Recent Labs    04/23/18 0159 04/24/18 0157 04/24/18 0925 04/24/18 1948 04/25/18 0208  HGB 10.7* 11.1*  --   --  11.4*  HCT 34.7* 35.6*  --   --  36.7*  PLT 141* 152  --   --  161  LABPROT 17.1* 14.3  --   --   --   INR 1.41 1.12  --   --   --   HEPARINUNFRC 0.38 <0.10* 0.22* 0.29* 0.34  CREATININE 0.91 0.81  --  0.81  --     Estimated Creatinine Clearance: 68.7 mL/min (by C-G formula based on SCr of 0.81 mg/dL).  Assessment: 84 y.o. M presents with acute appendicitis. Pt on coumadin PTA for afib (admission INR therapeutic) - held and reversed with vit K 10mg  IV 11/3. INR down to 1.12. Current plan for medical management and to continue holding coumadin in case surgery needed. Pharmacy consulted to start heparin bridge.   Heparin level therapeutic. CBC stable.  No bleeding issues documented.  Goal of Therapy:  Heparin level 0.3-0.7 units/ml Monitor platelets by anticoagulation protocol: Yes   Plan:  Continue heparin gtt at 1300 units/hr for now Monitor daily heparin level and CBC, s/sx bleeding F/u ability to transition back to coumadin - watch DDI with Flagyl  [name], PharmD, BCPS Clinical Pharmacist Clinical phone [phone] Please check AMION for all [place] contact numbers 04/25/2018 10:44 AM   ADDENDUM:  Pharmacy consulted to resume warfarin. INR down to 1.12 after held doses. Noted DDI with Flagyl - likely will cause INR to bump. No bleed issues documented.  PTA dose - 5mg  daily except 2.5mg  on Tues  Plan: Continue heparin gtt at 1300 units/hr for now Monitor daily heparin level/INR/CBC, s/sx bleeding, DDI Resume warfarin 2.5mg  PO x 1 - lower dose while on Flagyl  [name], PharmD, BCPS Clinical Pharmacist Please check AMION for all [place] contact numbers 04/25/2018 2:52 PM

## 2018-04-25 NOTE — Progress Notes (Addendum)
Nutrition Brief Note  RD consulted for assessment of nutritional requirements and needs; surgical PA specifically requesting for help with oral supplement selection.   Wt Readings from Last 15 Encounters:  04/23/18 81.5 kg  03/04/18 73 kg  01/28/18 80.4 kg  12/31/17 79.8 kg  12/19/17 78 kg  12/04/17 78.5 kg  10/24/17 76.2 kg  09/24/17 72.1 kg  09/18/17 75.8 kg  09/11/17 73.2 kg  09/06/17 76.4 kg  08/31/17 77.5 kg  08/29/17 78 kg  08/17/17 77.7 kg  08/01/17 77.1 kg   Donathan Buller Baswell is a 84 y.o. male presenting with acute appendicitis. PMH is significant for atrial fibrillation on chronic anticoagulation, aortic valve replacement with porcine valve, CAD, CVD, BPH, MI status post PCI to OM without stent, CVA, and hypertension.  Pt admitted with acute appendicitis.   Spoke with pt and wife at bedside, who reports both eating breakfast at time of visit. Pt reports he always has a great appetite- he consumes 2-3 meals per day and often splits entrees with wife at favorite restaurants. Per pt wife, pt eats very healthfully and consumes a lot of vegetables and whole grains and has been working on decreased pasta intake. Pt noted that he consumed about 25% of banana, 100% of juice, 100% of eggs, 100% of biscuit, and 50% of grits for breakfast.   Wt has been stable.   Pt wife reports that pt has never had a problem with proper nutrition and weight loss. Her largest complaint was that pt had limited choices while on a liquid diet. Pt will occasionally consume Glucerna at home. Pt wife feels satisfied with food choices now that he is on solid foods. Offered PRN supplements, however, politely declined ("I don't think he needs them here"). Both express appreciation for visit, however, deny any further nutrition needs or interventions at this time.   Nutrition-Focused physical exam completed. Findings are no fat depletion, mild muscle depletion, and no edema. Mild muscle depletion noted on  upper and lower extremities, which is common for patients with advanced age.   Albumin has a half-life of 21 days and is strongly affected by stress response and inflammatory process, therefore, do not expect to see an improvement in this lab value during acute hospitalization. When a patient presents with low albumin, it is likely skewed due to the acute inflammatory response.  Unless it is suspected that patient had poor PO intake or malnutrition prior to admission, then RD should not be consulted solely for low albumin. Note that low albumin is no longer used to diagnose malnutrition; [facility] uses the new malnutrition guidelines published by the American Society for Parenteral and Enteral Nutrition (A.S.P.E.N.) and the Academy of Nutrition and Dietetics (AND).    RD will order MVI due to advanced age.   Body mass index is 25.42 kg/m. Patient meets criteria for overweight based on current BMI.   Current diet order is regular, patient is consuming approximately 75% of meals at this time. Labs and medications reviewed.   No nutrition interventions warranted at this time. If nutrition issues arise, please consult RD.   Rosina Cressler A. Jimmye Norman, RD, LDN, CDE Pager: [phone] After hours Pager: [phone]

## 2018-04-25 NOTE — Progress Notes (Signed)
CC:   Abdominal pain  Subjective: He feels much better this a.m. I can't get him to show me any sites of pain this AM.  He is moving about 1200 on IS and not SOB this AM.  I saw him walking yesterday, we will work on that again today.    Objective: Vital signs in last 24 hours: Temp:  [98.2 F (36.8 C)-99.2 F (37.3 C)] 98.5 F (36.9 C) (11/07 0618) Pulse Rate:  [68-97] 97 (11/07 0618) Resp:  [14-18] 16 (11/07 0618) BP: (92-122)/(57-76) 122/76 (11/07 0618) SpO2:  [93 %-98 %] 96 % (11/07 0618) Last BM Date: 04/24/18 880 Po 1000 urine Stool x 4 Afebrile, VSS WBC 6.9 Hep 0.34  Intake/Output from previous day: 11/06 0701 - 11/07 0700 In: 880 [P.O.:880] Out: 1000 [Urine:1000] Intake/Output this shift: No intake/output data recorded.  General appearance: alert, cooperative and no distress Resp: clear to auscultation bilaterally GI: soft, non tender on current exam.  Tolerating full liquids and having BM's.  Lab Results:  Recent Labs    04/24/18 0157 04/25/18 0208  WBC 8.3 6.9  HGB 11.1* 11.4*  HCT 35.6* 36.7*  PLT 152 161    BMET Recent Labs    04/24/18 0157 04/24/18 1948  NA 137 137  K 3.8 3.6  CL 107 104  CO2 25 25  GLUCOSE 106* 132*  BUN 9 7*  CREATININE 0.81 0.81  CALCIUM 8.4* 8.4*   PT/INR Recent Labs    04/23/18 0159 04/24/18 0157  LABPROT 17.1* 14.3  INR 1.41 1.12    Recent Labs  Lab 04/21/18 1213  AST 18  ALT 10  ALKPHOS 49  BILITOT 0.8  PROT 6.1*  ALBUMIN 3.4*     Lipase     Component Value Date/Time   LIPASE 35 04/21/2018 1213     Medications: . alfuzosin  10 mg Oral Q breakfast  . cholecalciferol  2,000 Units Oral Daily  . digoxin  125 mcg Oral Daily  . docusate sodium  100 mg Oral QPM  . finasteride  5 mg Oral QHS  . guaiFENesin  600 mg Oral BID  . Melatonin  6 mg Oral QHS  . metoprolol tartrate  12.5 mg Oral BID  . polyethylene glycol  17 g Oral BID  . senna  2 tablet Oral Daily   . sodium chloride 10  mL/hr at 04/24/18 1545  . ceFEPime (MAXIPIME) IV 2 g (04/24/18 2155)  . heparin 1,300 Units/hr (04/24/18 2131)  . metronidazole 500 mg (04/25/18 0008)   Anti-infectives (From admission, onward)   Start     Dose/Rate Route Frequency Ordered Stop   04/25/18 1000  cefdinir (OMNICEF) capsule 300 mg     300 mg Oral Every 12 hours 04/25/18 0819     04/25/18 0830  metroNIDAZOLE (FLAGYL) tablet 500 mg     500 mg Oral Every 8 hours 04/25/18 0819     04/21/18 2200  ceFEPIme (MAXIPIME) 2 g in sodium chloride 0.9 % 100 mL IVPB  Status:  Discontinued     2 g 200 mL/hr over 30 Minutes Intravenous Every 12 hours 04/21/18 1600 04/25/18 0819   04/21/18 1630  metroNIDAZOLE (FLAGYL) IVPB 500 mg  Status:  Discontinued     500 mg 100 mL/hr over 60 Minutes Intravenous Every 8 hours 04/21/18 1549 04/25/18 0819   04/21/18 1445  ceFEPIme (MAXIPIME) 2 g in sodium chloride 0.9 % 100 mL IVPB     2 g 200 mL/hr over 30  Minutes Intravenous  Once 04/21/18 1444 04/22/18 0812   04/21/18 1445  metroNIDAZOLE (FLAGYL) IVPB 500 mg  Status:  Discontinued     500 mg 100 mL/hr over 60 Minutes Intravenous  Once 04/21/18 1444 04/21/18 1558      Assessment/Plan  Aortic stenosis with TAVR Atrial fibrillation, chronic Hx CVA Chronic anticoagulation-INR2.27, Vitamin K 10 mg >>1.45 today Hypertension BPH Hypokalemia- defer to Medicine  Acute appendicitis  - Planning medical management  FEN: IV fluids/full liquids ID: Cefepime/Flagyl 11/3 =>>day5 GEX:BMWUXLK per pharmacy -1050 units/h Follow up: TBD  Plan: I have a call in the pharmacy working on switching him over to an oral cefdinir and Flagyl.  Put him on a regular diet, I would recommend restarting his Coumadin.  Continue to mobilize, spirometry and flutter valve.  If he continues to make progress  LOS: 4 days    Journei Thomassen 04/25/2018 [phone]

## 2018-04-25 NOTE — Progress Notes (Signed)
Physical Therapy Treatment Patient Details Name: Wayne [name] MRN: [id number] DOB: 1930-05-01 Today's Date: 04/25/2018    History of Present Illness Pt is an 84 y/o male admitted secondary to swelling in abdomen. Found to have acute appendicitis. Pt with recent L superior and inferior pubic rami fractures and CT revealed healing of those fractures. PMH includes a fib, TAVR, CVA, MI, HTN, and CAD.     PT Comments    Patient seen for mobility progression. Pt requires min A for sit to stand transfers and min guard for gait training. Pt tolerated increased gait distance and stair training this session with SpO2 95% on RA.    Follow Up Recommendations  Home health PT;Supervision/assistance for mobility/OOB     Equipment Recommendations  None recommended by PT    Recommendations for Other Services OT consult     Precautions / Restrictions Precautions Precautions: Fall    Mobility  Bed Mobility Overal bed mobility: Needs Assistance Bed Mobility: Supine to Sit;Sit to Supine     Supine to sit: Min assist Sit to supine: Supervision   General bed mobility comments: assist to elevate trunk; family assisted; cues for log roll technique   Transfers Overall transfer level: Needs assistance Equipment used: Rolling walker (2 wheeled) Transfers: Sit to/from Stand Sit to Stand: Min assist         General transfer comment: assist to power up into standing; cues for hand placement   Ambulation/Gait Ambulation/Gait assistance: Min guard Gait Distance (Feet): 500 Feet Assistive device: Rolling walker (2 wheeled) Gait Pattern/deviations: Step-through pattern;Decreased stride length;Trunk flexed Gait velocity: Decreased    General Gait Details: cues for posture and increased stride length/cadence   Stairs Stairs: Yes Stairs assistance: Min guard Stair Management: One rail Right;Step to pattern;Sideways Number of Stairs: 4 General stair comments: cues for  safety   Wheelchair Mobility    Modified Rankin (Stroke Patients Only)       Balance Overall balance assessment: Needs assistance Sitting-balance support: Feet supported;No upper extremity supported Sitting balance-Leahy Scale: Good     Standing balance support: Bilateral upper extremity supported;During functional activity Standing balance-Leahy Scale: Poor Standing balance comment: Reliant on BUE support                             Cognition Arousal/Alertness: Awake/alert Behavior During Therapy: WFL for tasks assessed/performed Overall Cognitive Status: Within Functional Limits for tasks assessed                                        Exercises      General Comments General comments (skin integrity, edema, etc.): wife and daughter present in room; SpO2 95% on RA       Pertinent Vitals/Pain Pain Assessment: No/denies pain    Home Living                      Prior Function            PT Goals (current goals can now be found in the care plan section) Acute Rehab PT Goals Patient Stated Goal: to return home Progress towards PT goals: Progressing toward goals    Frequency    Min 3X/week      PT Plan Current plan remains appropriate    Co-evaluation  AM-PAC PT "6 Clicks" Daily Activity  Outcome Measure  Difficulty turning over in bed (including adjusting bedclothes, sheets and blankets)?: Unable Difficulty moving from lying on back to sitting on the side of the bed? : Unable Difficulty sitting down on and standing up from a chair with arms (e.g., wheelchair, bedside commode, etc,.)?: Unable Help needed moving to and from a bed to chair (including a wheelchair)?: A Little Help needed walking in hospital room?: A Little Help needed climbing 3-5 steps with a railing? : A Little 6 Click Score: 12    End of Session Equipment Utilized During Treatment: Gait belt Activity Tolerance: Patient tolerated  treatment well Patient left: in bed;with call bell/phone within reach;with family/visitor present Nurse Communication: Mobility status PT Visit Diagnosis: Unsteadiness on feet (R26.81);Muscle weakness (generalized) (M62.81)     Time: 0370-4888 PT Time Calculation (min) (ACUTE ONLY): 30 min  Charges:  $Gait Training: 23-37 mins                     Earney Navy, PTA Acute Rehabilitation Services Pager: [phone] Office: [phone]     Darliss Cheney 04/25/2018, 4:47 PM

## 2018-04-26 LAB — HEPARIN LEVEL (UNFRACTIONATED): HEPARIN UNFRACTIONATED: 0.3 [IU]/mL (ref 0.30–0.70)

## 2018-04-26 LAB — PROTIME-INR
INR: 1.21
Prothrombin Time: 15.2 seconds (ref 11.4–15.2)

## 2018-04-26 MED ORDER — CEFDINIR 300 MG PO CAPS: 300.0000 mg | ORAL_CAPSULE | Freq: Two times a day (BID) | ORAL | 0 refills | Status: AC

## 2018-04-26 MED ORDER — WARFARIN SODIUM 5 MG PO TABS: 5.0000 mg | ORAL_TABLET | Freq: Once | ORAL | Status: DC

## 2018-04-26 MED ORDER — ADULT MULTIVITAMIN W/MINERALS CH: 1.0000 | ORAL_TABLET | Freq: Every day | ORAL | Status: AC

## 2018-04-26 MED ORDER — METRONIDAZOLE 500 MG PO TABS: 500.0000 mg | ORAL_TABLET | Freq: Three times a day (TID) | ORAL | 0 refills | Status: AC

## 2018-04-26 MED ORDER — ENOXAPARIN SODIUM 80 MG/0.8ML ~~LOC~~ SOLN
1.0000 mg/kg | Freq: Two times a day (BID) | SUBCUTANEOUS | Status: DC
  Administered 2018-04-26: 80 mg via SUBCUTANEOUS
  Filled 2018-04-26: qty 0.8

## 2018-04-26 MED ORDER — ENOXAPARIN SODIUM 80 MG/0.8ML ~~LOC~~ SOLN: 1.0000 mg/kg | Freq: Two times a day (BID) | SUBCUTANEOUS | 0 refills | Status: DC

## 2018-04-26 MED ORDER — WARFARIN SODIUM 5 MG PO TABS
5.0000 mg | ORAL_TABLET | Freq: Once | ORAL | Status: AC
  Administered 2018-04-26: 5 mg via ORAL
  Filled 2018-04-26: qty 1

## 2018-04-26 NOTE — Progress Notes (Addendum)
Family Medicine Teaching Service Daily Progress Note Intern Pager: [phone]  Patient name: Wayne [name] Medical record number: [id number] Date of birth: 23-Jul-1929 Age: 84 y.o. Gender: male  Primary Care Provider: [name], DO Consultants:Surgery Code Status:DNR  Pt Overview and Major Events to Date: 11/3 - Admitted to tele with Acute Appendicitis  Truth Barot Vanderburgis a 84 y.o.malepresenting with acute appendicitis. PMH is significant foratrial fibrillationonchronic anticoagulation,aorticvalve replacement with porcine valve, CAD, CVD, BPH, MI status post PCI to OM without stent, CVA, and hypertension.  Acute appendicitis, stable:CT scan showed acute appendicitis and small phlegmonous change adjacent to the tipthatmay reflect microperforation.WBCwithin normal limits, patient afebrile, and there is no explicit tenderness toRLQpalpation  -Surgery consulted - patient can have liquids,continueantibiotics,hold Coumadin, initiate vitamin K. INR 2.27 >1.45 >1.41> 1.12> 1.2 (11/7). -Cefdinir and Flagyl PO through 05/01/2018 for total 10 day course. -Surgery consulted:signed off. -Continuous cardiac and pulse ox monitoring -Tylenol PRN pain -IV normal saline at 100 cc/hourwhenpatient is NPO -Remain on clear liquid diet per surgery -Up with assistance -Daily vitals -We will evaluate need for further intervention based on clinical picture, abdominal exam, vitals, and lab values  Shortness of breath:patienthad an episode ofSOB overnight 11/4 - 11/5 with desatting ito 93%and placed on 2L [place]. At the time, this was felt be potentially due to being fluid overloaded as patient is on IV maintenance fluids as well as consuming liquids PO.Fluids were stopped, although he was not fluid overloaded on physical exam 11/4 (no lower extremity edema to calves or thighs).A similar event happened again overnight 11/5-11/4 with reported desatted during.  However, there are no records of the satting below 96% in the vitals. It is known that the patient has interstitial lung disease. - d/c IV fluids - Continue PO fluid intake ad lib - Continue lasix - Do not use supplemental oxygen unless saturation on room air less than 92%.  Atrial fibrillationchronic, stable:Patient in A. fib on physical exam.Managed with digoxin 125 mcg daily and Lopressor 12.5 mg twice daily. Rate is well controlled. Treated with Coumadin.On heparin. -Continue digoxin and metoprolol -Hold heparin, restart Lovenox with dosing per pharmacy (1 mg/kg twice daily), and restart Coumadin at half dose (2.5 mg daily).  Recommend close outpatient follow-up for INR checks.  Aortic valve replacement, chronic, stable:Porcine valvevia TAVR in March 2019. Treated at home with warfarin. INR2.27 > 1.41>1.12 (11/6).On heparin. -Hold heparin, restart Lovenox with dosing per pharmacy (1 mg/kg twice daily), and restart Coumadin at half dose (2.5 mg daily). -Strict I&Os  Hx CVA with balance deficit, chronic, stable: Cerebellar stroke in2007resulted in residualimbalanceproblems.  -Monitor -ambulate with assistance  Constipation, chronic:Patient has a history of constipation, however usually has BM once daily.Patient most recently had a bowel movement on 11/4 and again on 11/5. -ContinuehomeSenokot daily, Colace daily, and MiraLAX twice daily as needed  Mild neurocognitive decline:Patient has experienced mild neurocognitive decline and short-term memory loss since aortic valve replacement in March 2019. On exam in the emergency department, patient is neurologically intact and alert and oriented x3. -Monitor -Keep patient oriented during the day by displaying time, day, date, keeping room well lit during the day and dark at night.  BPH, chronic, stable:Patient reports having urinary frequency due to BPH. Takes finasteride 5 mg daily and alfuzosin 10 mg  daily. -Continue homemeds  FEN/GI:Full liquid diet, Senokot, Colace, MiraLAX; melatonin 6 mg for sleep Prophylaxis:Heparin  Disposition:Admit to telemetry  Subjective:  Patient is seen this morning sitting upright in his recliner, eating breakfast.  He  states he feels great and would like to go home.  He denies nausea, vomiting, abdominal pain, and changes to bowel movements.  Objective: Temp:  [97.7 F (36.5 C)-98.8 F (37.1 C)] 98.7 F (37.1 C) (11/08 0526) Pulse Rate:  [74-89] 89 (11/08 0526) Resp:  [18] 18 (11/08 0526) BP: (109-116)/(52-68) 109/52 (11/08 0526) SpO2:  [95 %-97 %] 95 % (11/08 0526) Weight:  [78.9 kg] 78.9 kg (11/08 0455)  Physical Exam  Constitutional: He is oriented to person, place, and time. He appears well-developed and well-nourished.  Non-toxic appearance. No distress.  HENT:  Head: Normocephalic.  Eyes: EOM are normal.  Cardiovascular: Normal rate and intact distal pulses.  No murmur heard. Irregularly irregular rhythm, chronic, stable, rate controlled  Pulmonary/Chest: Effort normal and breath sounds normal.  Abdominal: Normal appearance and bowel sounds are normal. He exhibits no distension and no ascites. There is no tenderness. There is no rebound, no guarding and no tenderness at McBurney's point. No hernia.  Tenderness elicited with palpation to lateral RLQ.  Neurological: He is alert and oriented to person, place, and time.  Skin: Skin is warm and dry.  Psychiatric: He has a normal mood and affect. His behavior is normal.   Laboratory: Recent Labs  Lab 04/23/18 0159 04/24/18 0157 04/25/18 0208  WBC 5.3 8.3 6.9  HGB 10.7* 11.1* 11.4*  HCT 34.7* 35.6* 36.7*  PLT 141* 152 161   Recent Labs  Lab 04/21/18 1213  04/23/18 0159 04/24/18 0157 04/24/18 1948  NA 138   < > 138 137 137  K 3.5   < > 3.7 3.8 3.6  CL 102   < > 111 107 104  CO2 28   < > 22 25 25   BUN 12   < > 11 9 7*  CREATININE 1.06   < > 0.91 0.81 0.81  CALCIUM 8.6*    < > 8.3* 8.4* 8.4*  PROT 6.1*  --   --   --   --   BILITOT 0.8  --   --   --   --   ALKPHOS 49  --   --   --   --   ALT 10  --   --   --   --   AST 18  --   --   --   --   GLUCOSE 157*   < > 94 106* 132*   < > = values in this interval not displayed.   Imaging/Diagnostic Tests: Dg Chest 2 View  Result Date: 04/23/2018 CLINICAL DATA:  Dyspnea.  Nonproductive cough. EXAM: CHEST - 2 VIEW COMPARISON:  Chest x-rays dated 01/21/2018 and 09/10/2017 FINDINGS: There are small bilateral pleural effusions with slight accentuation of the interstitial markings with peribronchial thickening, possibly representing mild pulmonary edema. Pulmonary vascularity is at the upper limits of normal but increased since the prior study. TAVR.  Aortic atherosclerosis.  No acute bone abnormality. IMPRESSION: 1. Probable mild bilateral pulmonary edema with small pleural effusions. 2.  Aortic Atherosclerosis (ICD10-I70.0). Electronically Signed   By: [name] M.D.   On: 04/23/2018 10:25   Ct Abdomen Pelvis W Contrast  Result Date: 04/21/2018 CLINICAL DATA:  Abdominal pain. EXAM: CT ABDOMEN AND PELVIS WITH CONTRAST TECHNIQUE: Multidetector CT imaging of the abdomen and pelvis was performed using the standard protocol following bolus administration of intravenous contrast. CONTRAST:  169mL OMNIPAQUE IOHEXOL 300 MG/ML  SOLN COMPARISON:  CT abdomen pelvis dated March 23, 2018. FINDINGS: Lower chest: Small right and trace  left pleural effusions, slightly decreased when compared to prior study. Bilateral lower lobe subsegmental atelectasis. Hepatobiliary: No focal liver abnormality is seen. No gallstones, gallbladder wall thickening, or biliary dilatation. Pancreas: Unremarkable. No pancreatic ductal dilatation or surrounding inflammatory changes. Spleen: Normal in size without focal abnormality. Adrenals/Urinary Tract: The adrenal glands are unremarkable. Unchanged left renal simple cyst. No renal or ureteral calculi. No  hydronephrosis. Unchanged left posterolateral bladder diverticulum. No bladder wall thickening. Stomach/Bowel: Dilatation of the distal appendix with prominent distal periappendiceal inflammatory changes, consistent with acute appendicitis. Appendix: Location: Right lower quadrant, inferior to the cecum. Diameter: 11 mm. Appendicolith: None. Mucosal hyper-enhancement: Mild. Extraluminal gas: None. Periappendiceal collection: Small phlegmonous change adjacent to the tip of the appendix without discrete fluid collection. The stomach, small bowel, and colon are unremarkable. No obstruction. Vascular/Lymphatic: Aortic atherosclerosis. No enlarged abdominal or pelvic lymph nodes. Reproductive: Stable prostatomegaly. Other: No free fluid or pneumoperitoneum. Left pelvic hematoma has resolved. Musculoskeletal: No acute or significant osseous findings. Healing left superior and inferior pubic rami fractures. Chronic L3 compression deformity. IMPRESSION: 1. Acute appendicitis. Small phlegmonous change adjacent to the tip may reflect microperforation. No extraluminal gas or discrete periappendiceal fluid collection. 2. Slightly decreased small right trace left pleural effusions. 3. Healing left superior and inferior pubic rami fractures. Resolved left pelvic hematoma. 4.  Aortic atherosclerosis (ICD10-I70.0). Electronically Signed   By: Titus Dubin M.D.   On: 04/21/2018 14:33     Daisy Floro, DO 04/26/2018, 9:36 AM PGY-1, [place] Intern pager: [phone], text pages welcome

## 2018-04-26 NOTE — Progress Notes (Addendum)
[place] for enoxaparin / warfarin Indication: atrial fibrillation  Allergies  Allergen Reactions  . Penicillins Rash and Other (See Comments)    PATIENT HAS HAD A PCN REACTION WITH IMMEDIATE RASH, FACIAL/TONGUE/THROAT SWELLING, SOB, OR LIGHTHEADEDNESS WITH HYPOTENSION:  #  #  #  YES  #  #  #   Has patient had a PCN reaction causing severe rash involving mucus membranes or skin necrosis: No Has patient had a PCN reaction that required hospitalization No Has patient had a PCN reaction occurring within the last 10 years: No If all of the above answers are "NO", then may proceed with Cephalosporin use.  . Prednisone Other (See Comments)    Wheezing  . Procaine Hcl Other (See Comments)    Passed out after being given this at dental appointment  . Levofloxacin Rash  . Oxycodone Other (See Comments)    constipation    Patient Measurements: Height: 5' 10.5" (179.1 cm) Weight: 173 lb 15.1 oz (78.9 kg) IBW/kg (Calculated) : 74.15  HDW: 74.8 kg  Vital Signs: Temp: 98.7 F (37.1 C) (11/08 0526) Temp Source: Oral (11/08 0526) BP: 109/52 (11/08 0526) Pulse Rate: 89 (11/08 0526)  Labs: Recent Labs    04/24/18 0157  04/24/18 1948 04/25/18 0208 04/26/18 0226  HGB 11.1*  --   --  11.4*  --   HCT 35.6*  --   --  36.7*  --   PLT 152  --   --  161  --   LABPROT 14.3  --   --   --  15.2  INR 1.12  --   --   --  1.21  HEPARINUNFRC <0.10*   < > 0.29* 0.34 0.30  CREATININE 0.81  --  0.81  --   --    < > = values in this interval not displayed.    Estimated Creatinine Clearance: 68.7 mL/min (by C-G formula based on SCr of 0.81 mg/dL).  Assessment: 84 y.o. M presents with acute appendicitis. On Coumadin 5mg  daily exc 2.5mg  on Tues PTA for afib (admission INR therapeutic). Held and reversed with vit K 10mg  IV 11/3. Restarted on 11/7 to bridge with heparin. INR 1.21 and heparin level 0.3 today. (Also on Flagyl) Hgb 11.4, plts wnl.    Goal of  Therapy:  Heparin level 0.3-0.7 units/ml Monitor platelets by anticoagulation protocol: Yes  Plan: Increase heparin gtt slightly to 1,350 units/hr Give Coumadin 5mg  PO x 1 tonight Monitor daily heparin level and INR, CBC, s/s of bleed, and interaction with Flagyl  ADDENDUM:  Stop heparin gtt Start enoxaparin 80mg  [place] Q12h in 1 hour  Elenor Quinones, PharmD, BCPS Clinical Pharmacist Phone number [phone] 04/26/2018 9:17 AM

## 2018-04-26 NOTE — Progress Notes (Signed)
Occupational Therapy Treatment Patient Details Name: Wayne [name] MRN: [id number] DOB: Jan 02, 1930 Today's Date: 04/26/2018    History of present illness Pt is an 84 y/o male admitted secondary to swelling in abdomen. Found to have acute appendicitis. Pt with recent L superior and inferior pubic rami fractures and CT revealed healing of those fractures. PMH includes a fib, TAVR, CVA, MI, HTN, and CAD.    OT comments  Pt dressed for home with min guard (A) and wife present in the room. Pt is fall risk while in standing position. Recommend sitting for adls to decr fall risk. Pt with urgency to void and educated on clothing options to decr incontinence events.    Follow Up Recommendations  Home health OT;Supervision/Assistance - 24 hour    Equipment Recommendations  None recommended by OT    Recommendations for Other Services      Precautions / Restrictions Precautions Precautions: Fall       Mobility Bed Mobility               General bed mobility comments: in chair on arrival  Transfers Overall transfer level: Needs assistance Equipment used: Rolling walker (2 wheeled) Transfers: Sit to/from Stand Sit to Stand: Min guard         General transfer comment: pt requires bracign bil le on chair for balance    Balance Overall balance assessment: Needs assistance Sitting-balance support: Feet supported;No upper extremity supported Sitting balance-Leahy Scale: Good     Standing balance support: Bilateral upper extremity supported;During functional activity Standing balance-Leahy Scale: Fair Standing balance comment: reliant on RW and surfaces                           ADL either performed or assessed with clinical judgement   ADL Overall ADL's : Needs assistance/impaired Eating/Feeding: Modified independent   Grooming: Min guard;Standing                   Toilet Transfer: Minimal assistance;RW   Toileting- Clothing Manipulation and  Hygiene: Maximal assistance Toileting - Clothing Manipulation Details (indicate cue type and reason): pt with urgency and voiding on clothing. pt requires (A) for peri care. wife reports we have accidents at home but we just take our time. pt and wife educated on dressing in less layers that require removal to void. pt currenlty with long johns, pants with suspender and a pull over vest. the patient had to d/c vest to get to pant suspenders     Functional mobility during ADLs: Min guard;Rolling walker General ADL Comments: pt dressing for home and with bathroom urgency to void bowel. pt with loose stool. pt requires (A) for hygiene.      Vision       Perception     Praxis      Cognition Arousal/Alertness: Awake/alert Behavior During Therapy: WFL for tasks assessed/performed Overall Cognitive Status: Within Functional Limits for tasks assessed                                          Exercises     Shoulder Instructions       General Comments wife in room and giving detailed hx of patient. wife with better recall than patient    Pertinent Vitals/ Pain       Pain Assessment: No/denies pain  Home Living  Prior Functioning/Environment              Frequency  Min 2X/week        Progress Toward Goals  OT Goals(current goals can now be found in the care plan section)  Progress towards OT goals: Progressing toward goals  Acute Rehab OT Goals Patient Stated Goal: to return home OT Goal Formulation: With patient Time For Goal Achievement: 05/07/18 Potential to Achieve Goals: Good ADL Goals Pt Will Perform Grooming: with modified independence;standing Pt Will Perform Upper Body Bathing: with supervision;sitting Pt Will Transfer to Toilet: with min guard assist;ambulating;regular height toilet Additional ADL Goal #1: Pt will complete bed mobility mod i as precursor to adls  Plan Discharge  plan remains appropriate    Co-evaluation                 AM-PAC PT "6 Clicks" Daily Activity     Outcome Measure   Help from another person eating meals?: None Help from another person taking care of personal grooming?: A Little Help from another person toileting, which includes using toliet, bedpan, or urinal?: A Little Help from another person bathing (including washing, rinsing, drying)?: A Little Help from another person to put on and taking off regular upper body clothing?: A Little Help from another person to put on and taking off regular lower body clothing?: A Lot 6 Click Score: 18    End of Session Equipment Utilized During Treatment: Rolling walker  OT Visit Diagnosis: Unsteadiness on feet (R26.81);Repeated falls (R29.6);Muscle weakness (generalized) (M62.81)   Activity Tolerance Patient tolerated treatment well   Patient Left Other (comment)(in hall with PTA Warren Lacy)   Nurse Communication Mobility status;Precautions        Time: 1050-1120 OT Time Calculation (min): 30 min  Charges: OT General Charges $OT Visit: 1 Visit OT Treatments $Self Care/Home Management : 23-37 mins   Jeri Modena, OTR/L  Acute Rehabilitation Services Pager: [phone] Office: [phone] .    Parke Poisson B 04/26/2018, 3:15 PM

## 2018-04-26 NOTE — Progress Notes (Addendum)
CC:   Abdominal pain  Subjective: He feels better.  Working with PT  Objective: Vital signs in last 24 hours: Temp:  [97.7 F (36.5 C)-98.8 F (37.1 C)] 98.7 F (37.1 C) (11/08 0526) Pulse Rate:  [74-89] 89 (11/08 0526) Resp:  [18] 18 (11/08 0526) BP: (109-116)/(52-68) 109/52 (11/08 0526) SpO2:  [95 %-97 %] 95 % (11/08 0526) Weight:  [78.9 kg] 78.9 kg (11/08 0455) Last BM Date: 04/25/18   Intake/Output from previous day: 11/07 0701 - 11/08 0700 In: 1552.3 [P.O.:1260; I.V.:292.3] Out: 1300 [Urine:1300] Intake/Output this shift: Total I/O In: -  Out: 300 [Urine:300]  General appearance: alert, cooperative and no distress Resp: clear to auscultation bilaterally GI: soft, non tender on current exam.  Tolerating full liquids and having BM's.  Lab Results:  Recent Labs    04/24/18 0157 04/25/18 0208  WBC 8.3 6.9  HGB 11.1* 11.4*  HCT 35.6* 36.7*  PLT 152 161    BMET Recent Labs    04/24/18 0157 04/24/18 1948  NA 137 137  K 3.8 3.6  CL 107 104  CO2 25 25  GLUCOSE 106* 132*  BUN 9 7*  CREATININE 0.81 0.81  CALCIUM 8.4* 8.4*   PT/INR Recent Labs    04/24/18 0157 04/26/18 0226  LABPROT 14.3 15.2  INR 1.12 1.21    Recent Labs  Lab 04/21/18 1213  AST 18  ALT 10  ALKPHOS 49  BILITOT 0.8  PROT 6.1*  ALBUMIN 3.4*     Lipase     Component Value Date/Time   LIPASE 35 04/21/2018 1213     Medications: . alfuzosin  10 mg Oral Q breakfast  . cefdinir  300 mg Oral Q12H  . cholecalciferol  2,000 Units Oral Daily  . digoxin  125 mcg Oral Daily  . docusate sodium  100 mg Oral QPM  . finasteride  5 mg Oral QHS  . guaiFENesin  600 mg Oral BID  . Melatonin  6 mg Oral QHS  . metoprolol tartrate  12.5 mg Oral BID  . metroNIDAZOLE  500 mg Oral Q8H  . multivitamin with minerals  1 tablet Oral Daily  . polyethylene glycol  17 g Oral BID  . senna  2 tablet Oral Daily  . Warfarin - Pharmacist Dosing Inpatient   Does not apply q1800   . sodium  chloride 10 mL/hr at 04/24/18 1545  . heparin 1,300 Units/hr (04/26/18 0110)   Anti-infectives (From admission, onward)   Start     Dose/Rate Route Frequency Ordered Stop   04/25/18 1000  cefdinir (OMNICEF) capsule 300 mg     300 mg Oral Every 12 hours 04/25/18 0819     04/25/18 0830  metroNIDAZOLE (FLAGYL) tablet 500 mg     500 mg Oral Every 8 hours 04/25/18 0819     04/21/18 2200  ceFEPIme (MAXIPIME) 2 g in sodium chloride 0.9 % 100 mL IVPB  Status:  Discontinued     2 g 200 mL/hr over 30 Minutes Intravenous Every 12 hours 04/21/18 1600 04/25/18 0819   04/21/18 1630  metroNIDAZOLE (FLAGYL) IVPB 500 mg  Status:  Discontinued     500 mg 100 mL/hr over 60 Minutes Intravenous Every 8 hours 04/21/18 1549 04/25/18 0819   04/21/18 1445  ceFEPIme (MAXIPIME) 2 g in sodium chloride 0.9 % 100 mL IVPB     2 g 200 mL/hr over 30 Minutes Intravenous  Once 04/21/18 1444 04/22/18 0812   04/21/18 1445  metroNIDAZOLE (FLAGYL) IVPB  500 mg  Status:  Discontinued     500 mg 100 mL/hr over 60 Minutes Intravenous  Once 04/21/18 1444 04/21/18 1558      Assessment/Plan  Aortic stenosis with TAVR Atrial fibrillation, chronic Hx CVA Chronic anticoagulation-INR2.27, Vitamin K 10 mg >>1.45 today Hypertension BPH Hypokalemia- defer to Medicine  Acute appendicitis  - Planning medical management  FEN: Reg diet ID: Cefepime/Flagyl 11/3 =>>day6 [id number] per pharmacy -1050 units/h Follow up: TBD  Plan: On PO meds and abx.  Tolerating a regular diet.  Coumadin restarted.  Continue to mobilize, spirometry and flutter valve.  If he continues to make progress could d/c from our standpoint.  Will sign off.  Please call with any questions or concerns   LOS: 5 days    Skyylar Kopf C. 93/12/1694 [phone]

## 2018-04-26 NOTE — Progress Notes (Signed)
Physical Therapy Treatment Patient Details Name: Wayne [name] MRN: [id number] DOB: 07-May-1930 Today's Date: 04/26/2018    History of Present Illness Pt is an 84 y/o male admitted secondary to swelling in abdomen. Found to have acute appendicitis. Pt with recent L superior and inferior pubic rami fractures and CT revealed healing of those fractures. PMH includes a fib, TAVR, CVA, MI, HTN, and CAD.     PT Comments    Patient seen for mobility progression. Pt eager to ambulate and tolerated gait training well. SpO2 WNL with mobility and pt using IS 10X during session. Current plan remains appropriate.    Follow Up Recommendations  Home health PT;Supervision for mobility/OOB     Equipment Recommendations  None recommended by PT    Recommendations for Other Services OT consult     Precautions / Restrictions Precautions Precautions: Fall    Mobility  Bed Mobility               General bed mobility comments: pt up in room with OT upon arrival  Transfers Overall transfer level: Needs assistance Equipment used: Rolling walker (2 wheeled) Transfers: Sit to/from Stand Sit to Stand: Min guard         General transfer comment: min guard to return to sitting in recliner due to poor eccentric loading  Ambulation/Gait Ambulation/Gait assistance: Min guard Gait Distance (Feet): 220 Feet Assistive device: Rolling walker (2 wheeled) Gait Pattern/deviations: Step-through pattern;Decreased stride length;Trunk flexed Gait velocity: Decreased    General Gait Details: cues for increased stride length and cadence   Stairs             Wheelchair Mobility    Modified Rankin (Stroke Patients Only)       Balance Overall balance assessment: Needs assistance Sitting-balance support: Feet supported;No upper extremity supported Sitting balance-Leahy Scale: Good     Standing balance support: Bilateral upper extremity supported;During functional activity Standing  balance-Leahy Scale: Poor Standing balance comment: Reliant on BUE support                             Cognition Arousal/Alertness: Awake/alert Behavior During Therapy: WFL for tasks assessed/performed Overall Cognitive Status: Within Functional Limits for tasks assessed                                        Exercises      General Comments General comments (skin integrity, edema, etc.): wife in room; pt used IS 10X during session      Pertinent Vitals/Pain Pain Assessment: No/denies pain    Home Living                      Prior Function            PT Goals (current goals can now be found in the care plan section) Acute Rehab PT Goals Patient Stated Goal: to return home Progress towards PT goals: Progressing toward goals    Frequency    Min 3X/week      PT Plan Current plan remains appropriate    Co-evaluation              AM-PAC PT "6 Clicks" Daily Activity  Outcome Measure  Difficulty turning over in bed (including adjusting bedclothes, sheets and blankets)?: Unable Difficulty moving from lying on back to sitting on the side of the  bed? : Unable Difficulty sitting down on and standing up from a chair with arms (e.g., wheelchair, bedside commode, etc,.)?: Unable Help needed moving to and from a bed to chair (including a wheelchair)?: A Little Help needed walking in hospital room?: A Little Help needed climbing 3-5 steps with a railing? : A Little 6 Click Score: 12    End of Session Equipment Utilized During Treatment: Gait belt Activity Tolerance: Patient tolerated treatment well Patient left: with call bell/phone within reach;with family/visitor present;in chair Nurse Communication: Mobility status PT Visit Diagnosis: Unsteadiness on feet (R26.81);Muscle weakness (generalized) (M62.81)     Time: 2334-3568 PT Time Calculation (min) (ACUTE ONLY): 12 min  Charges:  $Gait Training: 8-22 mins                      Earney Navy, PTA Acute Rehabilitation Services Pager: [phone] Office: [phone]     Darliss Cheney 04/26/2018, 2:06 PM

## 2018-04-26 NOTE — Progress Notes (Signed)
Wayne [name] to be D/C'd  per MD order. Discussed with the patient and all questions fully answered.  VSS, Skin clean, dry and intact without evidence of skin break down, no evidence of skin tears noted.  IV catheter discontinued intact. Site without signs and symptoms of complications. Dressing and pressure applied.  An After Visit Summary was printed and given to the patient. Patient received prescription.  D/c education completed with patient/family including follow up instructions, medication list, d/c activities limitations if indicated, with other d/c instructions as indicated by MD - patient able to verbalize understanding, all questions fully answered.   Patient instructed to return to ED, call 911, or call MD for any changes in condition.   Patient to be escorted via [place], and D/C home via private auto.

## 2018-04-26 NOTE — Discharge Instructions (Signed)
Because you are on new antibiotics for your abdominal pain, we need to decrease your warfarin dose to half of what you were taking before he came to the hospital.  You will now take 2.5 mg instead of 5 mg on all days except Tuesday, and half of the 2.5 mg tablet on Tuesday.  Please continue taking Lovenox injections twice daily until you have an INR of between 2 and 3.  Please follow-up with Dr. Stanford Breed for your warfarin on Monday if possible and again later next week.  Please continue taking the antibiotic Cefdinir and Flagyl.  Please take Cefdinir 2 times per day and Flagyl 3 times per day.  The last day of these antibiotics will be November 13.

## 2018-04-29 ENCOUNTER — Ambulatory Visit (INDEPENDENT_AMBULATORY_CARE_PROVIDER_SITE_OTHER): Payer: Medicare HMO | Admitting: Pharmacist Clinician (PhC)/ Clinical Pharmacy Specialist

## 2018-04-29 DIAGNOSIS — Z5181 Encounter for therapeutic drug level monitoring: Secondary | ICD-10-CM

## 2018-04-29 DIAGNOSIS — I482 Chronic atrial fibrillation, unspecified: Secondary | ICD-10-CM | POA: Diagnosis not present

## 2018-04-29 LAB — POCT INR: INR: 1.5 — AB (ref 2.0–3.0)

## 2018-04-29 NOTE — Patient Instructions (Addendum)
Description   Take 1.5 tablets Monday Nov 11, then 1 tablet Tuesday Nov 12 and Wednesday Nov 13.  Repeat INR Thursday.   Continue with enoxaparin injections every 12 hours until you come back on Thursday.

## 2018-04-30 ENCOUNTER — Inpatient Hospital Stay: Payer: Medicare HMO | Admitting: Family Medicine

## 2018-05-01 ENCOUNTER — Encounter: Payer: Self-pay | Admitting: Family Medicine

## 2018-05-01 ENCOUNTER — Ambulatory Visit (INDEPENDENT_AMBULATORY_CARE_PROVIDER_SITE_OTHER): Payer: Medicare HMO | Admitting: Family Medicine

## 2018-05-01 VITALS — BP 118/80 | HR 87 | Temp 97.8°F | Ht 71.0 in | Wt 168.2 lb

## 2018-05-01 DIAGNOSIS — I482 Chronic atrial fibrillation, unspecified: Secondary | ICD-10-CM | POA: Diagnosis not present

## 2018-05-01 DIAGNOSIS — Z09 Encounter for follow-up examination after completed treatment for conditions other than malignant neoplasm: Secondary | ICD-10-CM

## 2018-05-01 DIAGNOSIS — K37 Unspecified appendicitis: Secondary | ICD-10-CM | POA: Diagnosis not present

## 2018-05-01 DIAGNOSIS — K5900 Constipation, unspecified: Secondary | ICD-10-CM | POA: Diagnosis not present

## 2018-05-01 DIAGNOSIS — Z7901 Long term (current) use of anticoagulants: Secondary | ICD-10-CM | POA: Diagnosis not present

## 2018-05-01 NOTE — Patient Instructions (Signed)
    Appendicitis The appendix is a finger-shaped tube that is attached to the large intestine. Appendicitis is inflammation of the appendix. Without treatment, appendicitis can cause the appendix to tear (rupture). A ruptured appendix can lead to a life-threatening infection. It can also lead to the formation of a painful collection of pus (abscess) in the appendix. What are the causes? This condition may be caused by a blockage in the appendix that leads to infection. The blockage can be due to:  A ball of stool.  Enlarged lymph glands.  In some cases, the cause may not be known. What increases the risk? This condition is more likely to develop in people who are 54-71 years of age. What are the signs or symptoms? Symptoms of this condition include:  Pain around the belly button that moves toward the lower right abdomen. The pain can become more severe as time passes. It gets worse with coughing or sudden movements.  Tenderness in the lower right abdomen.  Nausea.  Vomiting.  Loss of appetite.  Fever.  Constipation.  Diarrhea.  Generally not feeling well.  How is this diagnosed? This condition may be diagnosed with:  A physical exam.  Blood tests.  Urine test.  To confirm the diagnosis, an ultrasound, MRI, or CT scan may be done. How is this treated? This condition is usually treated by taking out the appendix (appendectomy). There are two methods for doing an appendectomy:  Open appendectomy. In this surgery, the appendix is removed through a large cut (incision) that is made in the lower right abdomen. This procedure may be recommended if: ? You have major scarring from a previous surgery. ? You have a bleeding disorder. ? You are pregnant and are near term. ? You have a condition that makes the laparoscopic procedure impossible, such as an advanced infection or a ruptured appendix.  Laparoscopic appendectomy. In this surgery, the appendix is removed through  small incisions. This procedure usually causes less pain and fewer problems than an open appendectomy. It also has a shorter recovery time.  If the appendix has ruptured and an abscess has formed, a drain may be placed into the abscess to remove fluid and antibiotic medicines may be given through an IV tube. The appendix may or may not need to be removed. This information is not intended to replace advice given to you by your health care provider. Make sure you discuss any questions you have with your health care provider. Document Released: 06/05/2005 Document Revised: 10/13/2015 Document Reviewed: 10/21/2014 Elsevier Interactive Patient Education  2017 [employer].

## 2018-05-01 NOTE — Progress Notes (Signed)
Impression and Recommendations:    1. Hospital discharge follow-up   2. Appendicitis, unspecified appendicitis type- med mgt   3. Chronic atrial fibrillation   4. Chronic anticoagulation   5. Constipation- Mixed with diarrhea     1. Appendicitis:  Completed flagyl this morning. No acute symptoms at this time. Advised to follow up if there are any acute symptoms either in the clinic or at [facility].   Advised the patient that while he is watching TV and every commercial to take 3 deep breaths with his incentive spirometer to aid with expanding his lungs and aiding with moving phlegm.   2. Chronic A-fib Continue on Warfarin as prescribed.  Maintain follow up with cardiology appointment tomorrow, 05/02/2018.   3. Constipation:  Constipation is resolved at this time per patient.    Gross side effects, risk and benefits, and alternatives of medications and treatment plan in general discussed with patient.  Patient is aware that all medications have potential side effects and we are unable to predict every side effect or drug-drug interaction that may occur.   Patient will call with any questions prior to using medication if they have concerns.    Expresses verbal understanding and consents to current therapy and treatment regimen.  No barriers to understanding were identified.  Red flag symptoms and signs discussed in detail.  Patient expressed understanding regarding what to do in case of emergency\urgent symptoms  Please see AVS handed out to patient at the end of our visit for further patient instructions/ counseling done pertaining to today's office visit.   Return for (2)Follow-up for chronic care as previously discussed..     Note:  This note was prepared with assistance of Dragon voice recognition software. Occasional wrong-word or sound-a-like substitutions may have occurred due to the inherent limitations of voice recognition software.  This document serves as a  record of services personally performed by [name], DO. It was created on her behalf by Steva Colder, a trained medical scribe. The creation of this record is based on the scribe's personal observations and the provider's statements to them.   I have reviewed the above medical documentation for accuracy and completeness and I concur.  [name], DO, D.O. 05/02/2018 6:58 AM         --------------------------------------------------------------------------------------------------------------------------------------------------------------------------------------------------------------------------------------------    Subjective:     HPI: Shannan Garfinkel is a 84 y.o. male who presents to [place] at [facility] today for issues as discussed below. He presents for follow up from his recent admission to the hospital for acute appendicitis on 04/21/2018-04/26/2018.    He was started on Lovenox and flagyl and just completed his course of flagyl this morning. He notes that with the flagyl his stomach is feeling better and he is able to consume foods with no issue. He has had an increasing productive cough, bleeding issues, nose bleeds, . His wife notes that as long as the patient takes his mucinex that his phlegm is decreased. He denies any issues with bowel/urinary issues, abdominal pain, fever, chills, dysuria, issues with eating, SOB, and any other symptoms. He is continuing to use his spirometer as previously advised. The patient is drinking more liquids and he is eating adequate meals. His wife is using hydrogen peroxide to the patients arms where the IV was placed and his skin is friable.   He had an anticoag visit with cardiology on 04/29/2018 and has a follow up appointment with cardiology on 05/02/2018.  Wt Readings from Last 3 Encounters:  05/01/18 168 lb 3.2 oz (76.3 kg)  04/26/18 173 lb 15.1 oz (78.9 kg)  03/04/18 161 lb (73 kg)   BP  Readings from Last 3 Encounters:  05/01/18 118/80  04/26/18 117/80  03/04/18 124/78   Pulse Readings from Last 3 Encounters:  05/01/18 87  04/26/18 93  03/04/18 (!) 105   BMI Readings from Last 3 Encounters:  05/01/18 23.46 kg/m  04/26/18 24.61 kg/m  03/04/18 22.45 kg/m     Patient Care Team    Relationship Specialty Notifications Start End  [name], DO PCP - General Family Medicine  10/23/16   Lelon Perla, MD PCP - Cardiology Cardiology Admissions 07/06/17   Jarome Matin, MD Consulting Physician Dermatology  10/23/16   Kathie Rhodes, MD Consulting Physician Urology  10/23/16   Latanya Maudlin, MD Consulting Physician Orthopedic Surgery  11/16/16    Comment: back pain     Patient Active Problem List   Diagnosis Date Noted  . Vitamin D deficiency 11/16/2016    Priority: High  . H/O non anemic vitamin B12 deficiency 11/16/2016    Priority: High  . Chronic Colonic dysmotility 11/16/2016    Priority: High  . Chronic atrial fibrillation 09/13/2010    Priority: High  . Chronic anticoagulation     Priority: Medium  . HTN (hypertension)     Priority: Medium  . CAD (coronary artery disease)     Priority: Medium  . h/o CVA (cerebrovascular accident due to intracerebral hemorrhage) 09/13/2010    Priority: Medium  . BPH with obstruction/lower urinary tract symptoms 09/13/2016    Priority: Low  . Dyspnea   . Normocytic anemia   . Appendicitis, acute 04/21/2018  . Pain in left knee 02/26/2018  . Pain in right knee 02/26/2018  . Fall 01/21/2018  . Closed fracture of left inferior pubic ramus ([place]) 01/21/2018  . Constipation 12/19/2017  .  hernia of linea alba 12/19/2017  . Osteoarthritis of hip 09/24/2017  . Gastroenteritis 09/10/2017  . S/P TAVR (transcatheter aortic valve replacement) 09/04/2017  . Severe mitral regurgitation   . Insomnia 07/10/2017  . High risk medication use 07/10/2017  . Long term current use of anticoagulant 07/10/2017  . Chronic  diastolic CHF (congestive heart failure) ([place]) 05/20/2017  . Hyponatremia 05/20/2017  . Severe aortic stenosis 05/20/2017  . Urinary retention   . 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 Spinal stenosis at L4-L5 level 04/26/2017  . Lumbar radiculopathy, chronic 04/26/2017  . Benign colonic polyp- 25 yrs ago.  11/16/2016  . Hypokalemia 09/13/2016  . Urinary frequency 07/10/2016  . Left bundle branch block (LBBB) on electrocardiogram 06/30/2016  . History of stroke 06/30/2016  . Generalized weakness 06/30/2016  . Asthenia 06/30/2016  . Osteoarthritis 04/04/2016  . Encounter for therapeutic drug monitoring 08/06/2013    Past Medical history, Surgical history, Family history, Social history, Allergies and Medications have been entered into the medical record, reviewed and changed as needed.    Current Meds  Medication Sig  . acetaminophen (TYLENOL) 500 MG tablet Take 1,000 mg by mouth every 6 (six) hours as needed for moderate pain or headache.  . alfuzosin (UROXATRAL) 10 MG 24 hr tablet Take 10 mg by mouth daily with breakfast.  . ARTIFICIAL TEAR OP Place 1 drop into both eyes daily as needed (dry eyes).  . [EXPIRED] cefdinir (OMNICEF) 300 MG capsule Take 1 capsule (300 mg total) by mouth every 12 (twelve) hours for  5 days.  . cholecalciferol 2000 units TABS Take 1 tablet (2,000 Units total) by mouth daily.  . digoxin (LANOXIN) 0.125 MG tablet Take 1 tablet (125 mcg total) by mouth daily.  Marland Kitchen docusate sodium (COLACE) 100 MG capsule Take 100 mg by mouth every evening.  . enoxaparin (LOVENOX) 80 MG/0.8ML injection Inject 0.8 mLs (80 mg total) into the skin every 12 (twelve) hours for 7 days. Stop taking when patient's INR is therapeutic (2-3) x 2.  . finasteride (PROSCAR) 5 MG tablet Take 5 mg by mouth at bedtime.   . furosemide (LASIX) 20 MG tablet TAKE 2 TABLETS BY MOUTH ONCE DAILY (Patient taking differently: Take 20 mg by mouth 2 (two) times daily. **Take  one half potassium with lasix**)  . furosemide (LASIX) 40 MG tablet Take 0.5 tablets (20 mg total) by mouth 2 (two) times daily as needed for fluid.  . Melatonin 10 MG TABS Take 10 mg by mouth at bedtime.   . Menthol, Topical Analgesic, (BIOFREEZE EX) Apply 1 application topically 3 (three) times daily as needed (muscle pain in back). alterrnates between [employer] and Blu [employer]mu  . Menthol, Topical Analgesic, (BLUE-EMU MAXIMUM STRENGTH EX) Apply 1 application topically 3 (three) times daily as needed (muscle pain in back). alterrnates between [employer] and [employer]  . metoprolol tartrate (LOPRESSOR) 25 MG tablet Take 0.5 tablets (12.5 mg total) by mouth 2 (two) times daily. (Patient taking differently: Take 12.5-25 mg by mouth 2 (two) times daily. Take two half tablets (25mg ) by mouth every morning and one-half (12.5mg ) tablet at bedtime.)  . [EXPIRED] metroNIDAZOLE (FLAGYL) 500 MG tablet Take 1 tablet (500 mg total) by mouth every 8 (eight) hours for 5 days.  . Multiple Vitamin (MULTIVITAMIN WITH MINERALS) TABS tablet Take 1 tablet by mouth daily.  . polyethylene glycol (MIRALAX / GLYCOLAX) packet Take 17 g by mouth 2 (two) times daily. (Patient taking differently: Take 17 g by mouth daily as needed for mild constipation. )  . potassium chloride (K-DUR) 10 MEQ tablet Take 5 mEq by mouth daily as needed for fluid. TAKE POTASSIUM IF YOU TAKE LASIX  . potassium chloride (K-DUR,KLOR-CON) 10 MEQ tablet Take 1 tablet (10 mEq total) by mouth daily.  Marland Kitchen senna (SENOKOT) 8.6 MG TABS tablet Take 2 tablets (17.2 mg total) by mouth daily.  . sodium chloride (OCEAN) 0.65 % SOLN nasal spray Place 1 spray into both nostrils daily as needed for congestion.  . triamcinolone cream (KENALOG) 0.1 % Apply 1 application topically daily as needed (dry skin).   Marland Kitchen warfarin (COUMADIN) 2.5 MG tablet TAKE 1 TO 2 TABLETS BY MOUTH ONCE DAILY AS  DIRECTED  BY  COUMADIN  CLINIC (Patient taking differently: Take 2.5-5 mg by mouth See  admin instructions. Take 2.5mg  on Tuesdays and all other days take 5mg )    Allergies:  Allergies  Allergen Reactions  . Penicillins Rash and Other (See Comments)    PATIENT HAS HAD A PCN REACTION WITH IMMEDIATE RASH, FACIAL/TONGUE/THROAT SWELLING, SOB, OR LIGHTHEADEDNESS WITH HYPOTENSION:  #  #  #  YES  #  #  #   Has patient had a PCN reaction causing severe rash involving mucus membranes or skin necrosis: No Has patient had a PCN reaction that required hospitalization No Has patient had a PCN reaction occurring within the last 10 years: No If all of the above answers are "NO", then may proceed with Cephalosporin use.  . Prednisone Other (See Comments)    Wheezing  .  Procaine Hcl Other (See Comments)    Passed out after being given this at dental appointment  . Levofloxacin Rash  . Oxycodone Other (See Comments)    constipation     Review of Systems:  A fourteen system review of systems was performed and found to be positive as per HPI.   Objective:   Blood pressure 118/80, pulse 87, temperature 97.8 F (36.6 C), height 5\' 11"  (1.803 m), weight 168 lb 3.2 oz (76.3 kg), SpO2 99 %. Body mass index is 23.46 kg/m. General:  Well Developed, well nourished, appropriate for stated age.  Neuro:  Alert and oriented,  extra-ocular muscles intact  HEENT:  Normocephalic, atraumatic, neck supple, no carotid bruits appreciated  Skin:  no gross rash, warm, pink. Cardiac:  RRR, S1 S2 Respiratory:  ECTA B/L and A/P, Not using accessory muscles, speaking in full sentences- unlabored. Vascular:  Ext warm, no cyanosis apprec.; cap RF less 2 sec. Psych:  No HI/SI, judgement and insight good, Euthymic mood. Full Affect.

## 2018-05-02 ENCOUNTER — Ambulatory Visit (INDEPENDENT_AMBULATORY_CARE_PROVIDER_SITE_OTHER): Payer: Medicare HMO | Admitting: Pharmacist Clinician (PhC)/ Clinical Pharmacy Specialist

## 2018-05-02 DIAGNOSIS — Z5181 Encounter for therapeutic drug level monitoring: Secondary | ICD-10-CM | POA: Diagnosis not present

## 2018-05-02 DIAGNOSIS — I482 Chronic atrial fibrillation, unspecified: Secondary | ICD-10-CM | POA: Diagnosis not present

## 2018-05-02 LAB — POCT INR: INR: 2.4 (ref 2.0–3.0)

## 2018-05-02 NOTE — Patient Instructions (Signed)
Description   Continue with 1 tablet daily except 1/2 tablet each Tuesday.  Repeat INR in 3 weeks

## 2018-05-03 ENCOUNTER — Telehealth: Payer: Self-pay | Admitting: Family Medicine

## 2018-05-03 NOTE — Telephone Encounter (Signed)
Tharon Aquas a nurse with [facility] called and left VM stating that patient has declined home health moving forward and she wanted to make PCP aware of patient's decision. She states that if you have any question or need additional information to reach her at [phone]

## 2018-05-03 NOTE — Telephone Encounter (Signed)
Noted MPulliam, CMA/RT(R)  

## 2018-05-15 ENCOUNTER — Other Ambulatory Visit: Payer: Self-pay | Admitting: Cardiology

## 2018-05-23 ENCOUNTER — Ambulatory Visit (INDEPENDENT_AMBULATORY_CARE_PROVIDER_SITE_OTHER): Payer: Medicare HMO | Admitting: Pharmacist Clinician (PhC)/ Clinical Pharmacy Specialist

## 2018-05-23 DIAGNOSIS — I482 Chronic atrial fibrillation, unspecified: Secondary | ICD-10-CM

## 2018-05-23 DIAGNOSIS — Z5181 Encounter for therapeutic drug level monitoring: Secondary | ICD-10-CM

## 2018-05-23 LAB — POCT INR: INR: 3.8 — AB (ref 2.0–3.0)

## 2018-05-23 NOTE — Patient Instructions (Signed)
Description   No warfarin today Thursday Dec 5, then decrease dose to 1 tablet daily except 1/2 tablet each Tuesday and Friday.  Repeat INR in  weeks

## 2018-05-29 ENCOUNTER — Other Ambulatory Visit: Payer: Self-pay | Admitting: Cardiology

## 2018-06-17 ENCOUNTER — Ambulatory Visit (INDEPENDENT_AMBULATORY_CARE_PROVIDER_SITE_OTHER): Payer: Medicare HMO | Admitting: Pharmacist Clinician (PhC)/ Clinical Pharmacy Specialist

## 2018-06-17 DIAGNOSIS — I482 Chronic atrial fibrillation, unspecified: Secondary | ICD-10-CM | POA: Diagnosis not present

## 2018-06-17 DIAGNOSIS — Z5181 Encounter for therapeutic drug level monitoring: Secondary | ICD-10-CM

## 2018-06-17 LAB — POCT INR: INR: 2.2 (ref 2.0–3.0)

## 2018-06-20 ENCOUNTER — Inpatient Hospital Stay (HOSPITAL_COMMUNITY)
Admission: EM | Admit: 2018-06-20 | Discharge: 2018-06-28 | DRG: 853 | Disposition: A | Discharge status: Home Health | Payer: Medicare Other | Attending: Family Medicine | Admitting: Family Medicine

## 2018-06-20 ENCOUNTER — Emergency Department (HOSPITAL_COMMUNITY): Payer: Medicare Other

## 2018-06-20 ENCOUNTER — Other Ambulatory Visit: Payer: Self-pay

## 2018-06-20 ENCOUNTER — Observation Stay (HOSPITAL_COMMUNITY): Payer: Medicare Other

## 2018-06-20 ENCOUNTER — Encounter (HOSPITAL_COMMUNITY): Payer: Self-pay | Admitting: Emergency Medicine

## 2018-06-20 DIAGNOSIS — M1990 Unspecified osteoarthritis, unspecified site: Secondary | ICD-10-CM | POA: Diagnosis not present

## 2018-06-20 DIAGNOSIS — R0902 Hypoxemia: Secondary | ICD-10-CM | POA: Diagnosis not present

## 2018-06-20 DIAGNOSIS — R0689 Other abnormalities of breathing: Secondary | ICD-10-CM | POA: Diagnosis not present

## 2018-06-20 DIAGNOSIS — Z880 Allergy status to penicillin: Secondary | ICD-10-CM

## 2018-06-20 DIAGNOSIS — I5033 Acute on chronic diastolic (congestive) heart failure: Secondary | ICD-10-CM

## 2018-06-20 DIAGNOSIS — Z9842 Cataract extraction status, left eye: Secondary | ICD-10-CM | POA: Diagnosis not present

## 2018-06-20 DIAGNOSIS — K3532 Acute appendicitis with perforation and localized peritonitis, without abscess: Secondary | ICD-10-CM | POA: Diagnosis not present

## 2018-06-20 DIAGNOSIS — Z823 Family history of stroke: Secondary | ICD-10-CM

## 2018-06-20 DIAGNOSIS — I4821 Permanent atrial fibrillation: Secondary | ICD-10-CM | POA: Diagnosis not present

## 2018-06-20 DIAGNOSIS — E876 Hypokalemia: Secondary | ICD-10-CM | POA: Diagnosis not present

## 2018-06-20 DIAGNOSIS — Z66 Do not resuscitate: Secondary | ICD-10-CM | POA: Diagnosis present

## 2018-06-20 DIAGNOSIS — S59902A Unspecified injury of left elbow, initial encounter: Secondary | ICD-10-CM | POA: Diagnosis not present

## 2018-06-20 DIAGNOSIS — M5416 Radiculopathy, lumbar region: Secondary | ICD-10-CM | POA: Diagnosis present

## 2018-06-20 DIAGNOSIS — Z7409 Other reduced mobility: Secondary | ICD-10-CM | POA: Diagnosis not present

## 2018-06-20 DIAGNOSIS — I447 Left bundle-branch block, unspecified: Secondary | ICD-10-CM | POA: Diagnosis present

## 2018-06-20 DIAGNOSIS — Z961 Presence of intraocular lens: Secondary | ICD-10-CM | POA: Diagnosis present

## 2018-06-20 DIAGNOSIS — I10 Essential (primary) hypertension: Secondary | ICD-10-CM | POA: Diagnosis present

## 2018-06-20 DIAGNOSIS — Z8249 Family history of ischemic heart disease and other diseases of the circulatory system: Secondary | ICD-10-CM

## 2018-06-20 DIAGNOSIS — R079 Chest pain, unspecified: Secondary | ICD-10-CM | POA: Diagnosis not present

## 2018-06-20 DIAGNOSIS — I252 Old myocardial infarction: Secondary | ICD-10-CM | POA: Diagnosis not present

## 2018-06-20 DIAGNOSIS — G4700 Insomnia, unspecified: Secondary | ICD-10-CM | POA: Diagnosis present

## 2018-06-20 DIAGNOSIS — E872 Acidosis: Secondary | ICD-10-CM | POA: Diagnosis present

## 2018-06-20 DIAGNOSIS — I340 Nonrheumatic mitral (valve) insufficiency: Secondary | ICD-10-CM | POA: Diagnosis not present

## 2018-06-20 DIAGNOSIS — I482 Chronic atrial fibrillation, unspecified: Secondary | ICD-10-CM | POA: Diagnosis not present

## 2018-06-20 DIAGNOSIS — R1084 Generalized abdominal pain: Secondary | ICD-10-CM | POA: Diagnosis not present

## 2018-06-20 DIAGNOSIS — R6510 Systemic inflammatory response syndrome (SIRS) of non-infectious origin without acute organ dysfunction: Secondary | ICD-10-CM | POA: Diagnosis present

## 2018-06-20 DIAGNOSIS — K3580 Unspecified acute appendicitis: Secondary | ICD-10-CM | POA: Diagnosis not present

## 2018-06-20 DIAGNOSIS — N138 Other obstructive and reflux uropathy: Secondary | ICD-10-CM | POA: Diagnosis present

## 2018-06-20 DIAGNOSIS — D649 Anemia, unspecified: Secondary | ICD-10-CM | POA: Diagnosis present

## 2018-06-20 DIAGNOSIS — Z9861 Coronary angioplasty status: Secondary | ICD-10-CM

## 2018-06-20 DIAGNOSIS — Z881 Allergy status to other antibiotic agents status: Secondary | ICD-10-CM

## 2018-06-20 DIAGNOSIS — Z7901 Long term (current) use of anticoagulants: Secondary | ICD-10-CM

## 2018-06-20 DIAGNOSIS — Z885 Allergy status to narcotic agent status: Secondary | ICD-10-CM

## 2018-06-20 DIAGNOSIS — I2510 Atherosclerotic heart disease of native coronary artery without angina pectoris: Secondary | ICD-10-CM | POA: Diagnosis present

## 2018-06-20 DIAGNOSIS — W19XXXA Unspecified fall, initial encounter: Secondary | ICD-10-CM | POA: Diagnosis not present

## 2018-06-20 DIAGNOSIS — A419 Sepsis, unspecified organism: Secondary | ICD-10-CM | POA: Diagnosis not present

## 2018-06-20 DIAGNOSIS — I272 Pulmonary hypertension, unspecified: Secondary | ICD-10-CM | POA: Diagnosis present

## 2018-06-20 DIAGNOSIS — Z8673 Personal history of transient ischemic attack (TIA), and cerebral infarction without residual deficits: Secondary | ICD-10-CM

## 2018-06-20 DIAGNOSIS — I959 Hypotension, unspecified: Secondary | ICD-10-CM | POA: Diagnosis not present

## 2018-06-20 DIAGNOSIS — I110 Hypertensive heart disease with heart failure: Secondary | ICD-10-CM | POA: Diagnosis not present

## 2018-06-20 DIAGNOSIS — D696 Thrombocytopenia, unspecified: Secondary | ICD-10-CM | POA: Diagnosis not present

## 2018-06-20 DIAGNOSIS — Z952 Presence of prosthetic heart valve: Secondary | ICD-10-CM | POA: Diagnosis not present

## 2018-06-20 DIAGNOSIS — S299XXA Unspecified injury of thorax, initial encounter: Secondary | ICD-10-CM | POA: Diagnosis not present

## 2018-06-20 DIAGNOSIS — Z7982 Long term (current) use of aspirin: Secondary | ICD-10-CM

## 2018-06-20 DIAGNOSIS — E538 Deficiency of other specified B group vitamins: Secondary | ICD-10-CM | POA: Diagnosis present

## 2018-06-20 DIAGNOSIS — Z789 Other specified health status: Secondary | ICD-10-CM

## 2018-06-20 DIAGNOSIS — N401 Enlarged prostate with lower urinary tract symptoms: Secondary | ICD-10-CM | POA: Diagnosis present

## 2018-06-20 DIAGNOSIS — D490 Neoplasm of unspecified behavior of digestive system: Secondary | ICD-10-CM | POA: Diagnosis not present

## 2018-06-20 DIAGNOSIS — D373 Neoplasm of uncertain behavior of appendix: Secondary | ICD-10-CM | POA: Diagnosis not present

## 2018-06-20 DIAGNOSIS — S0990XA Unspecified injury of head, initial encounter: Secondary | ICD-10-CM | POA: Diagnosis not present

## 2018-06-20 DIAGNOSIS — S42402A Unspecified fracture of lower end of left humerus, initial encounter for closed fracture: Secondary | ICD-10-CM | POA: Diagnosis not present

## 2018-06-20 DIAGNOSIS — Z888 Allergy status to other drugs, medicaments and biological substances status: Secondary | ICD-10-CM

## 2018-06-20 DIAGNOSIS — T148XXA Other injury of unspecified body region, initial encounter: Secondary | ICD-10-CM

## 2018-06-20 DIAGNOSIS — I5032 Chronic diastolic (congestive) heart failure: Secondary | ICD-10-CM | POA: Diagnosis not present

## 2018-06-20 DIAGNOSIS — R000 Tachycardia, unspecified: Secondary | ICD-10-CM | POA: Diagnosis not present

## 2018-06-20 DIAGNOSIS — R269 Unspecified abnormalities of gait and mobility: Secondary | ICD-10-CM | POA: Diagnosis not present

## 2018-06-20 DIAGNOSIS — M25522 Pain in left elbow: Secondary | ICD-10-CM | POA: Diagnosis not present

## 2018-06-20 DIAGNOSIS — R109 Unspecified abdominal pain: Secondary | ICD-10-CM | POA: Diagnosis not present

## 2018-06-20 DIAGNOSIS — Z8719 Personal history of other diseases of the digestive system: Secondary | ICD-10-CM

## 2018-06-20 DIAGNOSIS — I4891 Unspecified atrial fibrillation: Secondary | ICD-10-CM | POA: Diagnosis not present

## 2018-06-20 DIAGNOSIS — K5909 Other constipation: Secondary | ICD-10-CM | POA: Diagnosis present

## 2018-06-20 DIAGNOSIS — E559 Vitamin D deficiency, unspecified: Secondary | ICD-10-CM | POA: Diagnosis present

## 2018-06-20 DIAGNOSIS — Z953 Presence of xenogenic heart valve: Secondary | ICD-10-CM

## 2018-06-20 DIAGNOSIS — R1031 Right lower quadrant pain: Secondary | ICD-10-CM | POA: Diagnosis present

## 2018-06-20 DIAGNOSIS — R9431 Abnormal electrocardiogram [ECG] [EKG]: Secondary | ICD-10-CM | POA: Diagnosis not present

## 2018-06-20 DIAGNOSIS — C181 Malignant neoplasm of appendix: Secondary | ICD-10-CM | POA: Diagnosis not present

## 2018-06-20 DIAGNOSIS — J90 Pleural effusion, not elsewhere classified: Secondary | ICD-10-CM | POA: Diagnosis not present

## 2018-06-20 LAB — CBC WITH DIFFERENTIAL/PLATELET
Abs Immature Granulocytes: 0.01 10*3/uL (ref 0.00–0.07)
Basophils Absolute: 0 10*3/uL (ref 0.0–0.1)
Basophils Relative: 0 %
EOS ABS: 0.1 10*3/uL (ref 0.0–0.5)
Eosinophils Relative: 1 %
HEMATOCRIT: 41.4 % (ref 39.0–52.0)
HEMOGLOBIN: 13 g/dL (ref 13.0–17.0)
Immature Granulocytes: 0 %
LYMPHS ABS: 0.7 10*3/uL (ref 0.7–4.0)
LYMPHS PCT: 15 %
MCH: 29 pg (ref 26.0–34.0)
MCHC: 31.4 g/dL (ref 30.0–36.0)
MCV: 92.2 fL (ref 80.0–100.0)
MONO ABS: 0.3 10*3/uL (ref 0.1–1.0)
Monocytes Relative: 6 %
Neutro Abs: 3.9 10*3/uL (ref 1.7–7.7)
Neutrophils Relative %: 78 %
Platelets: 127 10*3/uL — ABNORMAL LOW (ref 150–400)
RBC: 4.49 MIL/uL (ref 4.22–5.81)
RDW: 15.8 % — ABNORMAL HIGH (ref 11.5–15.5)
WBC: 5 10*3/uL (ref 4.0–10.5)
nRBC: 0 % (ref 0.0–0.2)

## 2018-06-20 LAB — I-STAT CG4 LACTIC ACID, ED
Lactic Acid, Venous: 4.45 mmol/L (ref 0.5–1.9)
Lactic Acid, Venous: 1.69 mmol/L (ref 0.5–1.9)

## 2018-06-20 LAB — COMPREHENSIVE METABOLIC PANEL
ALBUMIN: 3.8 g/dL (ref 3.5–5.0)
ALK PHOS: 40 U/L (ref 38–126)
ALT: 12 U/L (ref 0–44)
ANION GAP: 13 (ref 5–15)
AST: 29 U/L (ref 15–41)
BILIRUBIN TOTAL: 1.1 mg/dL (ref 0.3–1.2)
BUN: 14 mg/dL (ref 8–23)
CALCIUM: 8.8 mg/dL — AB (ref 8.9–10.3)
CO2: 23 mmol/L (ref 22–32)
Chloride: 103 mmol/L (ref 98–111)
Creatinine, Ser: 1.02 mg/dL (ref 0.61–1.24)
Glucose, Bld: 103 mg/dL — ABNORMAL HIGH (ref 70–99)
Potassium: 3.9 mmol/L (ref 3.5–5.1)
Sodium: 139 mmol/L (ref 135–145)
TOTAL PROTEIN: 6.5 g/dL (ref 6.5–8.1)

## 2018-06-20 LAB — URINALYSIS, ROUTINE W REFLEX MICROSCOPIC
BILIRUBIN URINE: NEGATIVE
Glucose, UA: NEGATIVE mg/dL
Hgb urine dipstick: NEGATIVE
KETONES UR: NEGATIVE mg/dL
Leukocytes, UA: NEGATIVE
NITRITE: NEGATIVE
PH: 5 (ref 5.0–8.0)
Protein, ur: NEGATIVE mg/dL
Specific Gravity, Urine: 1.017 (ref 1.005–1.030)

## 2018-06-20 LAB — LIPASE, BLOOD: LIPASE: 30 U/L (ref 11–51)

## 2018-06-20 LAB — PROTIME-INR
INR: 2.14
Prothrombin Time: 23.6 seconds — ABNORMAL HIGH (ref 11.4–15.2)

## 2018-06-20 LAB — INFLUENZA PANEL BY PCR (TYPE A & B)
Influenza A By PCR: NEGATIVE
Influenza B By PCR: NEGATIVE

## 2018-06-20 MED ORDER — ACETAMINOPHEN 650 MG RE SUPP: 650.0000 mg | Freq: Four times a day (QID) | RECTAL | Status: DC | PRN

## 2018-06-20 MED ORDER — VANCOMYCIN HCL IN DEXTROSE 1-5 GM/200ML-% IV SOLN: 1000.0000 mg | Freq: Once | INTRAVENOUS | Status: DC

## 2018-06-20 MED ORDER — FINASTERIDE 5 MG PO TABS
5.0000 mg | ORAL_TABLET | Freq: Every day | ORAL | Status: DC
  Administered 2018-06-20 – 2018-06-27 (×8): 5 mg via ORAL
  Filled 2018-06-20 (×8): qty 1

## 2018-06-20 MED ORDER — SODIUM CHLORIDE 0.9 % IV BOLUS (SEPSIS)
1000.0000 mL | Freq: Once | INTRAVENOUS | Status: AC
  Administered 2018-06-20: 1000 mL via INTRAVENOUS

## 2018-06-20 MED ORDER — ACETAMINOPHEN 650 MG RE SUPP
650.0000 mg | Freq: Once | RECTAL | Status: AC
  Administered 2018-06-20: 650 mg via RECTAL
  Filled 2018-06-20: qty 1

## 2018-06-20 MED ORDER — SODIUM CHLORIDE 0.9 % IV BOLUS (SEPSIS)
500.0000 mL | Freq: Once | INTRAVENOUS | Status: AC
  Administered 2018-06-20: 500 mL via INTRAVENOUS

## 2018-06-20 MED ORDER — SODIUM CHLORIDE 0.9 % IV BOLUS: 30.0000 mL/kg | Freq: Once | INTRAVENOUS | Status: DC

## 2018-06-20 MED ORDER — DIGOXIN 125 MCG PO TABS
125.0000 ug | ORAL_TABLET | Freq: Every day | ORAL | Status: DC
  Administered 2018-06-20 – 2018-06-28 (×9): 125 ug via ORAL
  Filled 2018-06-20 (×9): qty 1

## 2018-06-20 MED ORDER — MELATONIN 3 MG PO TABS
9.0000 mg | ORAL_TABLET | Freq: Every day | ORAL | Status: DC
  Administered 2018-06-21 – 2018-06-27 (×6): 9 mg via ORAL
  Filled 2018-06-20 (×9): qty 3

## 2018-06-20 MED ORDER — SODIUM CHLORIDE 0.9 % IV SOLN
INTRAVENOUS | Status: DC
  Administered 2018-06-20 – 2018-06-22 (×5): via INTRAVENOUS

## 2018-06-20 MED ORDER — VANCOMYCIN HCL 10 G IV SOLR
1500.0000 mg | INTRAVENOUS | Status: DC
  Filled 2018-06-20: qty 1500

## 2018-06-20 MED ORDER — SODIUM CHLORIDE 0.9 % IV SOLN
2.0000 g | Freq: Two times a day (BID) | INTRAVENOUS | Status: DC
  Administered 2018-06-20 – 2018-06-28 (×16): 2 g via INTRAVENOUS
  Filled 2018-06-20 (×16): qty 2

## 2018-06-20 MED ORDER — METRONIDAZOLE IN NACL 5-0.79 MG/ML-% IV SOLN
500.0000 mg | Freq: Three times a day (TID) | INTRAVENOUS | Status: DC
  Administered 2018-06-20 – 2018-06-26 (×19): 500 mg via INTRAVENOUS
  Filled 2018-06-20 (×19): qty 100

## 2018-06-20 MED ORDER — VANCOMYCIN HCL 10 G IV SOLR
1500.0000 mg | Freq: Once | INTRAVENOUS | Status: AC
  Administered 2018-06-20: 1500 mg via INTRAVENOUS
  Filled 2018-06-20: qty 1500

## 2018-06-20 MED ORDER — IOHEXOL 300 MG/ML  SOLN
100.0000 mL | Freq: Once | INTRAMUSCULAR | Status: AC | PRN
  Administered 2018-06-20: 100 mL via INTRAVENOUS

## 2018-06-20 MED ORDER — SODIUM CHLORIDE 0.9 % IV SOLN
2.0000 g | Freq: Once | INTRAVENOUS | Status: AC
  Administered 2018-06-20: 2 g via INTRAVENOUS
  Filled 2018-06-20: qty 2

## 2018-06-20 MED ORDER — ACETAMINOPHEN 325 MG PO TABS
650.0000 mg | ORAL_TABLET | Freq: Four times a day (QID) | ORAL | Status: DC | PRN
  Administered 2018-06-20 – 2018-06-22 (×5): 650 mg via ORAL
  Filled 2018-06-20 (×6): qty 2

## 2018-06-20 MED ORDER — SODIUM CHLORIDE 0.9 % IV BOLUS: 1000.0000 mL | Freq: Once | INTRAVENOUS | Status: DC

## 2018-06-20 MED ORDER — SODIUM CHLORIDE 0.9 % IV SOLN: 2.0000 g | Freq: Once | INTRAVENOUS | Status: DC

## 2018-06-20 NOTE — Consult Note (Signed)
Wayne [name] [birth year]  [id number].    Requesting MD: Dr. Dorris Singh Chief Complaint/Reason for Consult: RLQ abdominal pain, history of appendicitis  HPI:  This is an 85 yo white male with a significant PMH for A fib on coumadin, CAD, s/p TAVR in February of 2019, h/o CVA, HTN, h/o MI, etc who was admitted in November of 2019 secondary to acute appendicitis.  His cardiac risk was felt to be quite high at that time and the patient wanted to try conservative management.  This was totally reasonable and the patient actually improved.  He has been doing great for the last two months.  He was in his normal state of health until this morning he got up and fell back against the wall and slid down due to weakness.  He had a good BM on the toilet, but 911 was called as the wife was unable to move him more.  He has become lethargic, but still able to answer most questions.  He was brought to the ED and found to have a temp of 103.  His WBC was normal, along with a CXR and UA.  He had a repeat CT scan that still shows some inflammation around his appendix, but actually improved from his last scan.  He is having abdominal pain that just restarted today.  He is being admitted by medicine with blood cultures pending.  We have been asked to see him for further recommendations given his scan and clinical findings.  ROS: ROS: Please see HPI, otherwise all other systems are negative except for some intermittent SOB at home which is chronic along with some sputum production at night which he takes mucinex for.  No recent chest pain.  He has trouble starting his urinary stream, but this is also chronic too.  Family History  Problem Relation Age of Onset  . Heart disease Mother   . Heart attack Mother   . Heart disease Father   . Heart attack Father   . Heart attack Brother   . Heart attack Sister   . Stroke Brother     Past Medical History:  Diagnosis Date  . Anxiety    "maybe; having  trouble at night" (04/24/2018)  . Aortic stenosis   . Arthritis   . Atrial fibrillation, chronic   . BPH (benign prostatic hyperplasia)   . CAD (coronary artery disease)   . Chronic anticoagulation   . Heart murmur   . Hepatitis 1960s   "drank contaminated water" (04/23/2018)  . History of chicken pox   . HTN (hypertension)    "have never had high blood pressure" (04/23/2018)  . Mitral stenosis   . Old MI (myocardial infarction) 2007   s/p PCI to distal OM (no stent)  . Pneumonia    "1 time" (04/23/2018)  . Severe mitral regurgitation   . 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 CATARACT EXTRACTION W/ INTRAOCULAR LENS IMPLANT Left   . CORONARY ANGIOPLASTY  2007   Distal OM  . PROSTATE BIOPSY    . RIGHT/LEFT HEART CATH AND CORONARY ANGIOGRAPHY N/A 08/01/2017   Procedure: RIGHT/LEFT HEART CATH AND CORONARY ANGIOGRAPHY;  Surgeon: Sherren Mocha, MD;  Location: [facility];  Service: Cardiovascular;  Laterality: N/A;  . TEE WITHOUT CARDIOVERSION N/A 07/20/2017   Procedure: TRANSESOPHAGEAL ECHOCARDIOGRAM (TEE);  Surgeon: Lelon Perla,  MD;  Location: [facility];  Service: Cardiovascular;  Laterality: N/A;  . TEE WITHOUT CARDIOVERSION N/A 09/04/2017   Procedure: TRANSESOPHAGEAL ECHOCARDIOGRAM (TEE);  Surgeon: Sherren Mocha, MD;  Location: [facility];  Service: Open Heart Surgery;  Laterality: N/A;  . TRANSCATHETER AORTIC VALVE REPLACEMENT, TRANSFEMORAL N/A 09/04/2017   Procedure: TRANSCATHETER AORTIC VALVE REPLACEMENT, TRANSFEMORAL;  Surgeon: Sherren Mocha, MD;  Location: [facility];  Service: Open Heart Surgery;  Laterality: N/A;    Social History:  reports that he has never smoked. He has never used smokeless tobacco. He reports that he does not drink alcohol or use drugs.  Allergies:  Allergies  Allergen Reactions  . Penicillins Rash and Other (See Comments)     PATIENT HAS HAD A PCN REACTION WITH IMMEDIATE RASH, FACIAL/TONGUE/THROAT SWELLING, SOB, OR LIGHTHEADEDNESS WITH HYPOTENSION:  #  #  #  YES  #  #  #   Has patient had a PCN reaction causing severe rash involving mucus membranes or skin necrosis: No Has patient had a PCN reaction that required hospitalization No Has patient had a PCN reaction occurring within the last 10 years: No If all of the above answers are "NO", then may proceed with Cephalosporin use.  . Prednisone Other (See Comments)    Wheezing  . Procaine Hcl Other (See Comments)    Passed out after being given this at dental appointment  . Levofloxacin Rash  . Oxycodone Other (See Comments)    constipation    (Not in a hospital admission)    Physical Exam: Blood pressure (!) 93/58, pulse 91, temperature 98.3 F (36.8 C), temperature source Oral, resp. rate 19, height 5\' 10"  (1.778 m), weight 81.6 kg, SpO2 99 %. General: pleasant, but somewhat lethargic, WD, WN white male who is laying in bed in NAD HEENT: head is normocephalic, atraumatic.  Sclera are noninjected.  PERRL.  Ears and nose without any masses or lesions.  Mouth is pink and very dry. Heart: irregular.  Normal s1,s2. No obvious gallops, or rubs noted. +murmur  Palpable radial and pedal pulses bilaterally Lungs: CTAB, no wheezes, rhonchi, or rales noted.  Respiratory effort nonlabored Abd: soft, diffusely tender in all quadrants but slightly more in RLQ, ND, +BS, no masses, hernias, or organomegaly MS: all 4 extremities are symmetrical with no cyanosis, clubbing.  LLE with some chronic edema, but RLE with no significant edema Skin: warm and dry with no masses, lesions, or rashes Psych: A&Ox3 with an appropriate affect, but more lethargic than normal.   Results for orders placed or performed during the hospital encounter of 06/20/18 (from the past 48 hour(s))  CBC with Differential     Status: Abnormal   Collection Time: 06/20/18  8:56 AM  Result Value Ref Range     WBC 5.0 4.0 - 10.5 K/uL   RBC 4.49 4.22 - 5.81 MIL/uL   Hemoglobin 13.0 13.0 - 17.0 g/dL   HCT 41.4 39.0 - 52.0 %   MCV 92.2 80.0 - 100.0 fL   MCH 29.0 26.0 - 34.0 pg   MCHC 31.4 30.0 - 36.0 g/dL   RDW 15.8 (H) 11.5 - 15.5 %   Platelets 127 (L) 150 - 400 K/uL   nRBC 0.0 0.0 - 0.2 %   Neutrophils Relative % 78 %   Neutro Abs 3.9 1.7 - 7.7 K/uL   Lymphocytes Relative 15 %   Lymphs Abs 0.7 0.7 - 4.0 K/uL   Monocytes Relative 6 %   Monocytes Absolute 0.3 0.1 - 1.0 K/uL  Eosinophils Relative 1 %   Eosinophils Absolute 0.1 0.0 - 0.5 K/uL   Basophils Relative 0 %   Basophils Absolute 0.0 0.0 - 0.1 K/uL   Immature Granulocytes 0 %   Abs Immature Granulocytes 0.01 0.00 - 0.07 K/uL    Comment: Performed at [facility] [address], [place], [place] [address]  Comprehensive metabolic panel     Status: Abnormal   Collection Time: 06/20/18  8:56 AM  Result Value Ref Range   Sodium 139 135 - 145 mmol/L   Potassium 3.9 3.5 - 5.1 mmol/L   Chloride 103 98 - 111 mmol/L   CO2 23 22 - 32 mmol/L   Glucose, Bld 103 (H) 70 - 99 mg/dL   BUN 14 8 - 23 mg/dL   Creatinine, Ser 1.02 0.61 - 1.24 mg/dL   Calcium 8.8 (L) 8.9 - 10.3 mg/dL   Total Protein 6.5 6.5 - 8.1 g/dL   Albumin 3.8 3.5 - 5.0 g/dL   AST 29 15 - 41 U/L   ALT 12 0 - 44 U/L   Alkaline Phosphatase 40 38 - 126 U/L   Total Bilirubin 1.1 0.3 - 1.2 mg/dL   GFR calc non Af Amer >60 >60 mL/min   GFR calc Af Amer >60 >60 mL/min   Anion gap 13 5 - 15    Comment: Performed at [facility] [address] [place], [place] [address]  Lipase, blood     Status: None   Collection Time: 06/20/18  8:56 AM  Result Value Ref Range   Lipase 30 11 - 51 U/L    Comment: Performed at [facility] [address], [place], [place] [address]  Protime-INR     Status: Abnormal   Collection Time: 06/20/18  8:56 AM  Result Value Ref Range   Prothrombin Time 23.6 (H) 11.4 - 15.2 seconds   INR 2.14     Comment: Performed at [facility] [address] [place], [place] [address]  Urinalysis, Routine w reflex microscopic     Status: None   Collection Time: 06/20/18  9:01 AM  Result Value Ref Range   Color, Urine YELLOW YELLOW   APPearance CLEAR CLEAR   Specific Gravity, Urine 1.017 1.005 - 1.030   pH 5.0 5.0 - 8.0   Glucose, UA NEGATIVE NEGATIVE mg/dL   Hgb urine dipstick NEGATIVE NEGATIVE   Bilirubin Urine NEGATIVE NEGATIVE   Ketones, ur NEGATIVE NEGATIVE mg/dL   Protein, ur NEGATIVE NEGATIVE mg/dL   Nitrite NEGATIVE NEGATIVE   Leukocytes, UA NEGATIVE NEGATIVE    Comment: Performed at [facility] [address] [place],  [address]  I-Stat CG4 Lactic Acid, ED     Status: Abnormal   Collection Time: 06/20/18  9:08 AM  Result Value Ref Range   Lactic Acid, Venous 4.45 (HH) 0.5 - 1.9 mmol/L   Comment NOTIFIED PHYSICIAN   Influenza panel by PCR (type A & B)     Status: None   Collection Time: 06/20/18 11:46 AM  Result Value Ref Range   Influenza A By PCR NEGATIVE NEGATIVE   Influenza B By PCR NEGATIVE NEGATIVE    Comment: (NOTE) The Xpert Xpress Flu assay is intended as an aid in the diagnosis of  influenza and should not be used as a sole basis for treatment.  This  assay is FDA approved for nasopharyngeal swab specimens only. Nasal  washings and aspirates are unacceptable for Xpert  Xpress Flu testing. Performed at [facility], [place] [address], [place], [place] [address]   I-Stat CG4 Lactic Acid, ED     Status: None   Collection Time: 06/20/18 11:52 AM  Result Value Ref Range   Lactic Acid, Venous 1.69 0.5 - 1.9 mmol/L   Dg Elbow 2 Views Left  Result Date: 06/20/2018 CLINICAL DATA:  Status post fall, altered mental status EXAM: LEFT ELBOW - 2 VIEW COMPARISON:  None. FINDINGS: Subtle lucency along the articular surface of the left radial head which may be projectional versus a subtle nondisplaced fracture. No other fracture or dislocation. Minimal osteoarthritis of the  ulnohumeral joint. Soft tissues are unremarkable. IMPRESSION: Subtle lucency along the articular surface of the left radial head which may be projectional versus a subtle nondisplaced fracture. Recommend oblique views of the elbow for further evaluation. Electronically Signed   By: Kathreen Devoid   On: 06/20/2018 09:48   Ct Head Wo Contrast  Result Date: 06/20/2018 CLINICAL DATA:  Head trauma. EXAM: CT HEAD WITHOUT CONTRAST TECHNIQUE: Contiguous axial images were obtained from the base of the skull through the vertex without intravenous contrast. COMPARISON:  CT scan of January 21, 2018. FINDINGS: Brain: Mild diffuse cortical atrophy is noted. Mild chronic ischemic white matter disease is noted. No mass effect or midline shift is noted. Ventricular size is within normal limits. There is no evidence of mass lesion, hemorrhage or acute infarction. Vascular: No hyperdense vessel or unexpected calcification. Skull: Normal. Negative for fracture or focal lesion. Sinuses/Orbits: No acute finding. Other: None. IMPRESSION: Mild diffuse cortical atrophy. Mild chronic ischemic white matter disease. No acute intracranial abnormality seen. Electronically Signed   By: Marijo Conception, M.D.   On: 06/20/2018 11:34   Ct Abdomen Pelvis W Contrast  Result Date: 06/20/2018 CLINICAL DATA:  Generalized abdominal pain with fever EXAM: CT ABDOMEN AND PELVIS WITH CONTRAST TECHNIQUE: Multidetector CT imaging of the abdomen and pelvis was performed using the standard protocol following bolus administration of intravenous contrast. CONTRAST:  1106mL OMNIPAQUE IOHEXOL 300 MG/ML  SOLN COMPARISON:  06/21/2017 FINDINGS: Lower chest: Cardiomegaly.  Dependent atelectasis.  No effusions. Hepatobiliary: No focal hepatic abnormality. Gallbladder unremarkable. Pancreas: No focal abnormality or ductal dilatation. Spleen: No focal abnormality.  Normal size. Adrenals/Urinary Tract: No hydronephrosis. Small cyst in the midpole of the left kidney. Adrenal  glands unremarkable. Left posterior bladder wall diverticulum measures 4 cm, stable. Stomach/Bowel: Appendix remains borderline dilated at 8 mm. There is a small fluid collection adjacent to the tip of the appendix in the area of prior phlegmonous change, measuring maximally 1.3 cm. Mild surrounding inflammation in the right lower quadrant. No evidence of bowel obstruction. Few scattered sigmoid diverticula. Vascular/Lymphatic: Aortic atherosclerosis. No enlarged abdominal or pelvic lymph nodes. Reproductive: Mildly prominent prostate with central calcifications. Other: No free fluid or free air. Musculoskeletal: No acute bony abnormality. IMPRESSION: Continued mild inflammation in the right lower quadrant adjacent to the appendix which is mildly dilated at 8 mm compared with 11 mm previously. Very small focal fluid collection adjacent to the tip of the appendix measures maximally 1.3 cm. This is too small to drain. Aortic atherosclerosis. Prostate prominence with left posterior bladder wall diverticulum, stable. Electronically Signed   By: Rolm Baptise M.D.   On: 06/20/2018 11:34   Dg Chest Port 1 View  Result Date: 06/20/2018 CLINICAL DATA:  Pain following fall EXAM: PORTABLE CHEST 1 VIEW COMPARISON:  April 23, 2018 FINDINGS: There is no edema or consolidation. Heart  is slightly enlarged, stable, with pulmonary vascularity normal. Patient is status post aortic valve replacement. There is aortic atherosclerosis. No adenopathy evident. There is degenerative change in each shoulder. IMPRESSION: No edema or consolidation. Mild cardiomegaly, stable. Status post aortic valve replacement. There is aortic atherosclerosis. Aortic Atherosclerosis (ICD10-I70.0). Electronically Signed   By: Lowella Grip III M.D.   On: 06/20/2018 09:46      Assessment/Plan S/p TAVR CAD A fib on coumadin HTN H/o MI H/O CVA SIRS, unclear etiology  Acute appendicitis The patient had a fever of 103 on arrival which is  unlikely to be secondary to an intra-abdominal source.  His appendix still shows some inflammatory changes, but not as significant as what was seen in November.  It is unlikely his fever is related to his appendix; however, that doesn't mean he may not have a bacteremia, etc secondary to smoldering appendicitis.  He did have a valve replacement in February and this could be evaluated as well to rule out endocarditis, but will defer to medicine.  As of right now, he does not need an emergent operation, but he may still require an operation this admission for his appendix if felt to be the source of what is making him ill.  Agree with antibiotic therapy and fluid resuscitation at this point and await further labs.  Hold coumadin, may bridge as needed.  Recommend cards consult for perioperative evaluation in case he needs surgery this admission.  FEN - NPO x ice VTE - hold coumadin, ok for chemical prophylaxis from our standpoint ID - [email or web address], [place], [facility] Surgery 06/20/2018, 4:19 PM Pager: [phone]

## 2018-06-20 NOTE — ED Notes (Signed)
Lab results reported to Dr. Ralene Bathe

## 2018-06-20 NOTE — ED Notes (Signed)
ED TO INPATIENT HANDOFF REPORT  Name/Age/Gender Wayne [name] 85 y.o. male  Code Status Code Status History    Date Active Date Inactive Code Status Order ID Comments User Context   04/21/2018 1547 04/26/2018 1828 DNR [id number]  Kathrene Alu, MD ED   01/22/2018 [id number] 01/28/2018 2308 DNR [id number]  Ivor Costa, MD Inpatient   01/22/2018 0059 01/22/2018 0609 Full Code [id number]  Ivor Costa, MD ED   09/10/2017 1952 09/15/2017 1657 Full Code [id number]  Quintella Baton, MD ED   09/04/2017 1214 09/06/2017 1546 Full Code [id number]  Gaye Pollack, MD Inpatient   08/01/2017 1248 08/01/2017 2041 Full Code [id number]  Sherren Mocha, MD Inpatient   05/20/2017 2331 05/23/2017 1706 Full Code [id number]  Jani Gravel, MD Inpatient   06/30/2016 2035 07/04/2016 1535 Full Code [id number]  Rama, Venetia Maxon, MD Inpatient    Questions for Most Recent Historical Code Status (Order [id number])    Question Answer Comment   In the event of cardiac or respiratory ARREST Do not call a "code blue"    In the event of cardiac or respiratory ARREST Do not perform Intubation, CPR, defibrillation or ACLS    In the event of cardiac or respiratory ARREST Use medication by any route, position, wound care, and other measures to relive pain and suffering. May use oxygen, suction and manual treatment of airway obstruction as needed for comfort.       Home/SNF/Other Home  Chief Complaint fever  poss sepsis  Level of Care/Admitting Diagnosis ED Disposition    ED Disposition Condition [facility]: [place] [[id number]]  Level of Care: Medical Telemetry [104]  Diagnosis: SIRS (systemic inflammatory response syndrome) [facility]) [[id number]]  Admitting Physician: Martyn Malay [[id number]]  Attending Physician: Martyn Malay [[id number]]  PT Class (Do Not Modify): Observation [104]  PT Acc Code (Do Not Modify): Observation [10022]       Medical History Past Medical History:  Diagnosis Date  .  Anxiety    "maybe; having trouble at night" (04/24/2018)  . Aortic stenosis   . Arthritis   . Atrial fibrillation, chronic   . BPH (benign prostatic hyperplasia)   . CAD (coronary artery disease)   . Chronic anticoagulation   . Heart murmur   . Hepatitis 1960s   "drank contaminated water" (04/23/2018)  . History of chicken pox   . HTN (hypertension)    "have never had high blood pressure" (04/23/2018)  . Mitral stenosis   . Old MI (myocardial infarction) 2007   s/p PCI to distal OM (no stent)  . Pneumonia    "1 time" (04/23/2018)  . Severe mitral regurgitation   . 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 Penicillins Rash and Other (See Comments)    PATIENT HAS HAD A PCN REACTION WITH IMMEDIATE RASH, FACIAL/TONGUE/THROAT SWELLING, SOB, OR LIGHTHEADEDNESS WITH HYPOTENSION:  #  #  #  YES  #  #  #   Has patient had a PCN reaction causing severe rash involving mucus membranes or skin necrosis: No Has patient had a PCN reaction that required hospitalization No Has patient had a PCN reaction occurring within the last 10 years: No If all of the above answers are "NO", then may proceed with Cephalosporin use.  . Prednisone Other (See Comments)    Wheezing  . Procaine Hcl Other (See Comments)  Passed out after being given this at dental appointment  . Levofloxacin Rash  . Oxycodone Other (See Comments)    constipation    IV Location/Drains/Wounds Patient Lines/Drains/Airways Status   Active Line/Drains/Airways    Name:   Placement date:   Placement time:   Site:   Days:   Peripheral IV 06/20/18 Right Antecubital   06/20/18    0853    Antecubital   less than 1   Peripheral IV 06/20/18 Left Forearm   06/20/18    0853    Forearm   less than 1   Incision (Closed) 09/04/17 Groin Right   09/04/17    1038     289   Incision (Closed) 09/04/17 Groin Left   09/04/17    1038     289    Pressure Injury 01/26/18 Stage I -  Intact skin with non-blanchable redness of a localized area usually over a bony prominence.   01/26/18    0930     145          Labs/Imaging Results for orders placed or performed during the hospital encounter of 06/20/18 (from the past 48 hour(s))  CBC with Differential     Status: Abnormal   Collection Time: 06/20/18  8:56 AM  Result Value Ref Range   WBC 5.0 4.0 - 10.5 K/uL   RBC 4.49 4.22 - 5.81 MIL/uL   Hemoglobin 13.0 13.0 - 17.0 g/dL   HCT 41.4 39.0 - 52.0 %   MCV 92.2 80.0 - 100.0 fL   MCH 29.0 26.0 - 34.0 pg   MCHC 31.4 30.0 - 36.0 g/dL   RDW 15.8 (H) 11.5 - 15.5 %   Platelets 127 (L) 150 - 400 K/uL   nRBC 0.0 0.0 - 0.2 %   Neutrophils Relative % 78 %   Neutro Abs 3.9 1.7 - 7.7 K/uL   Lymphocytes Relative 15 %   Lymphs Abs 0.7 0.7 - 4.0 K/uL   Monocytes Relative 6 %   Monocytes Absolute 0.3 0.1 - 1.0 K/uL   Eosinophils Relative 1 %   Eosinophils Absolute 0.1 0.0 - 0.5 K/uL   Basophils Relative 0 %   Basophils Absolute 0.0 0.0 - 0.1 K/uL   Immature Granulocytes 0 %   Abs Immature Granulocytes 0.01 0.00 - 0.07 K/uL    Comment: Performed at [facility], [address], [place], [place] [address]  Comprehensive metabolic panel     Status: Abnormal   Collection Time: 06/20/18  8:56 AM  Result Value Ref Range   Sodium 139 135 - 145 mmol/L   Potassium 3.9 3.5 - 5.1 mmol/L   Chloride 103 98 - 111 mmol/L   CO2 23 22 - 32 mmol/L   Glucose, Bld 103 (H) 70 - 99 mg/dL   BUN 14 8 - 23 mg/dL   Creatinine, Ser 1.02 0.61 - 1.24 mg/dL   Calcium 8.8 (L) 8.9 - 10.3 mg/dL   Total Protein 6.5 6.5 - 8.1 g/dL   Albumin 3.8 3.5 - 5.0 g/dL   AST 29 15 - 41 U/L   ALT 12 0 - 44 U/L   Alkaline Phosphatase 40 38 - 126 U/L   Total Bilirubin 1.1 0.3 - 1.2 mg/dL   GFR calc non Af Amer >60 >60 mL/min   GFR calc Af Amer >60 >60 mL/min   Anion gap 13 5 - 15    Comment: Performed at [facility] [address] [place],  [address]  Lipase, blood     Status: None   Collection Time: 06/20/18  8:56 AM  Result Value Ref Range   Lipase 30 11 - 51 U/L    Comment: Performed at [facility], [place] [address], [place], [place] [address]  Protime-INR     Status: Abnormal   Collection Time: 06/20/18  8:56 AM  Result Value Ref Range   Prothrombin Time 23.6 (H) 11.4 - 15.2 seconds   INR 2.14     Comment: Performed at [facility] [address] [place], [place] [address]  Urinalysis, Routine w reflex microscopic     Status: None   Collection Time: 06/20/18  9:01 AM  Result Value Ref Range   Color, Urine YELLOW YELLOW   APPearance CLEAR CLEAR   Specific Gravity, Urine 1.017 1.005 - 1.030   pH 5.0 5.0 - 8.0   Glucose, UA NEGATIVE NEGATIVE mg/dL   Hgb urine dipstick NEGATIVE NEGATIVE   Bilirubin Urine NEGATIVE NEGATIVE   Ketones, ur NEGATIVE NEGATIVE mg/dL   Protein, ur NEGATIVE NEGATIVE mg/dL   Nitrite NEGATIVE NEGATIVE   Leukocytes, UA NEGATIVE NEGATIVE    Comment: Performed at [facility] [address], [place],  [address]  I-Stat CG4 Lactic Acid, ED     Status: Abnormal   Collection Time: 06/20/18  9:08 AM  Result Value Ref Range   Lactic Acid, Venous 4.45 (HH) 0.5 - 1.9 mmol/L   Comment NOTIFIED PHYSICIAN   Influenza panel by PCR (type A & B)     Status: None   Collection Time: 06/20/18 11:46 AM  Result Value Ref Range   Influenza A By PCR NEGATIVE NEGATIVE   Influenza B By PCR NEGATIVE NEGATIVE    Comment: (NOTE) The Xpert Xpress Flu assay is intended as an aid in the diagnosis of  influenza and should not be used as a sole basis for treatment.  This  assay is FDA approved for nasopharyngeal swab specimens only. Nasal  washings and aspirates are unacceptable for Xpert Xpress Flu testing. Performed at [facility], [place] [address] [place], [place] [address]   I-Stat CG4 Lactic Acid, ED     Status: None   Collection Time: 06/20/18 11:52 AM  Result Value Ref Range   Lactic  Acid, Venous 1.69 0.5 - 1.9 mmol/L   Dg Elbow 2 Views Left  Result Date: 06/20/2018 CLINICAL DATA:  Status post fall, altered mental status EXAM: LEFT ELBOW - 2 VIEW COMPARISON:  None. FINDINGS: Subtle lucency along the articular surface of the left radial head which may be projectional versus a subtle nondisplaced fracture. No other fracture or dislocation. Minimal osteoarthritis of the ulnohumeral joint. Soft tissues are unremarkable. IMPRESSION: Subtle lucency along the articular surface of the left radial head which may be projectional versus a subtle nondisplaced fracture. Recommend oblique views of the elbow for further evaluation. Electronically Signed   By: Kathreen Devoid   On: 06/20/2018 09:48   Ct Head Wo Contrast  Result Date: 06/20/2018 CLINICAL DATA:  Head trauma. EXAM: CT HEAD WITHOUT CONTRAST TECHNIQUE: Contiguous axial images were obtained from the base of the skull through the vertex without intravenous contrast. COMPARISON:  CT scan of January 21, 2018. FINDINGS: Brain: Mild diffuse cortical atrophy is noted. Mild chronic ischemic white matter disease is noted. No mass effect or midline shift is noted. Ventricular size is within normal limits. There is no evidence of mass lesion, hemorrhage or acute infarction. Vascular: No  hyperdense vessel or unexpected calcification. Skull: Normal. Negative for fracture or focal lesion. Sinuses/Orbits: No acute finding. Other: None. IMPRESSION: Mild diffuse cortical atrophy. Mild chronic ischemic white matter disease. No acute intracranial abnormality seen. Electronically Signed   By: Marijo Conception, M.D.   On: 06/20/2018 11:34   Ct Abdomen Pelvis W Contrast  Result Date: 06/20/2018 CLINICAL DATA:  Generalized abdominal pain with fever EXAM: CT ABDOMEN AND PELVIS WITH CONTRAST TECHNIQUE: Multidetector CT imaging of the abdomen and pelvis was performed using the standard protocol following bolus administration of intravenous contrast. CONTRAST:  167mL  OMNIPAQUE IOHEXOL 300 MG/ML  SOLN COMPARISON:  06/21/2017 FINDINGS: Lower chest: Cardiomegaly.  Dependent atelectasis.  No effusions. Hepatobiliary: No focal hepatic abnormality. Gallbladder unremarkable. Pancreas: No focal abnormality or ductal dilatation. Spleen: No focal abnormality.  Normal size. Adrenals/Urinary Tract: No hydronephrosis. Small cyst in the midpole of the left kidney. Adrenal glands unremarkable. Left posterior bladder wall diverticulum measures 4 cm, stable. Stomach/Bowel: Appendix remains borderline dilated at 8 mm. There is a small fluid collection adjacent to the tip of the appendix in the area of prior phlegmonous change, measuring maximally 1.3 cm. Mild surrounding inflammation in the right lower quadrant. No evidence of bowel obstruction. Few scattered sigmoid diverticula. Vascular/Lymphatic: Aortic atherosclerosis. No enlarged abdominal or pelvic lymph nodes. Reproductive: Mildly prominent prostate with central calcifications. Other: No free fluid or free air. Musculoskeletal: No acute bony abnormality. IMPRESSION: Continued mild inflammation in the right lower quadrant adjacent to the appendix which is mildly dilated at 8 mm compared with 11 mm previously. Very small focal fluid collection adjacent to the tip of the appendix measures maximally 1.3 cm. This is too small to drain. Aortic atherosclerosis. Prostate prominence with left posterior bladder wall diverticulum, stable. Electronically Signed   By: Rolm Baptise M.D.   On: 06/20/2018 11:34   Dg Chest Port 1 View  Result Date: 06/20/2018 CLINICAL DATA:  Pain following fall EXAM: PORTABLE CHEST 1 VIEW COMPARISON:  April 23, 2018 FINDINGS: There is no edema or consolidation. Heart is slightly enlarged, stable, with pulmonary vascularity normal. Patient is status post aortic valve replacement. There is aortic atherosclerosis. No adenopathy evident. There is degenerative change in each shoulder. IMPRESSION: No edema or consolidation.  Mild cardiomegaly, stable. Status post aortic valve replacement. There is aortic atherosclerosis. Aortic Atherosclerosis (ICD10-I70.0). Electronically Signed   By: Lowella Grip III M.D.   On: 06/20/2018 09:46    Pending Labs Unresulted Labs (From admission, onward)    Start     Ordered   06/20/18 1345  Urine culture  Add-on,   STAT     06/20/18 1344   06/20/18 0902  Blood culture (routine x 2)  BLOOD CULTURE X 2,   STAT     06/20/18 0901   Signed and Held  Protime-INR  Tomorrow morning,   R     Signed and Held   Signed and Held  Basic metabolic panel  Tomorrow morning,   R     Signed and Held   Signed and Held  CBC  Tomorrow morning,   R     Signed and Held          Vitals/Pain Today's Vitals   06/20/18 1445 06/20/18 1502 06/20/18 1503 06/20/18 1504  BP:    (!) 93/58  Pulse: 98 96  91  Resp: 16 18  19   Temp:    98.3 F (36.8 C)  TempSrc:    Oral  SpO2: 96% 98%  99%  Weight:      Height:      PainSc:   0-No pain     Isolation Precautions No active isolations  Medications Medications  metroNIDAZOLE (FLAGYL) IVPB 500 mg (0 mg Intravenous Stopped 06/20/18 1132)  vancomycin (VANCOCIN) 1,500 mg in sodium chloride 0.9 % 500 mL IVPB (has no administration in time range)  ceFEPIme (MAXIPIME) 2 g in sodium chloride 0.9 % 100 mL IVPB (has no administration in time range)  acetaminophen (TYLENOL) suppository 650 mg (650 mg Rectal Given 06/20/18 1027)  ceFEPIme (MAXIPIME) 2 g in sodium chloride 0.9 % 100 mL IVPB (0 g Intravenous Stopped 06/20/18 1024)  vancomycin (VANCOCIN) 1,500 mg in sodium chloride 0.9 % 500 mL IVPB (0 mg Intravenous Stopped 06/20/18 1316)  sodium chloride 0.9 % bolus 1,000 mL (0 mLs Intravenous Stopped 06/20/18 1024)    And  sodium chloride 0.9 % bolus 1,000 mL (0 mLs Intravenous Stopped 06/20/18 1126)    And  sodium chloride 0.9 % bolus 500 mL (0 mLs Intravenous Stopped 06/20/18 1228)  iohexol (OMNIPAQUE) 300 MG/ML solution 100 mL (100 mLs Intravenous Contrast  Given 06/20/18 1127)    Mobility walks with device

## 2018-06-20 NOTE — ED Triage Notes (Addendum)
Pt arrives to ED from home with complaints of a fall this morning. EMS reports pt has been more lethargic as of late and fell this morning, catching himself and cutting his left elbow. Pt's wife stated that pt has been complaining of RLQ abdominal pain as well. Pt has chronic atrial fib with anticoagulation. Pt placed in position of comfort with bed locked and lowered, call bell in reach.

## 2018-06-20 NOTE — ED Notes (Signed)
Admitting MD at bedside.

## 2018-06-20 NOTE — Progress Notes (Signed)
Pharmacy Antibiotic Note  Wayne [name] is a 85 y.o. male admitted on 06/20/2018 with sepsis. Pharmacy has been consulted for vancomycin and cefepime dosing. Pt is febrile with Tmax 103, WBC is WNL. Lactic acid is significantly elevated. Scr is WNL but slightly higher than baseline.   Plan: Vancomycin 1500mg  IV Q24H Cefepime 2gm IV Q12H F/u renal fxn, C&S, clinical status and peak/trough at SS  Height: 5\' 10"  (177.8 cm) Weight: 180 lb (81.6 kg) IBW/kg (Calculated) : 73  Temp (24hrs), Avg:103 F (39.4 C), Min:103 F (39.4 C), Max:103 F (39.4 C)  Recent Labs  Lab 06/20/18 0908  LATICACIDVEN 4.45*    CrCl cannot be calculated (Patient's most recent lab result is older than the maximum 21 days allowed.).    Allergies  Allergen Reactions  . Penicillins Rash and Other (See Comments)    PATIENT HAS HAD A PCN REACTION WITH IMMEDIATE RASH, FACIAL/TONGUE/THROAT SWELLING, SOB, OR LIGHTHEADEDNESS WITH HYPOTENSION:  #  #  #  YES  #  #  #   Has patient had a PCN reaction causing severe rash involving mucus membranes or skin necrosis: No Has patient had a PCN reaction that required hospitalization No Has patient had a PCN reaction occurring within the last 10 years: No If all of the above answers are "NO", then may proceed with Cephalosporin use.  . Prednisone Other (See Comments)    Wheezing  . Procaine Hcl Other (See Comments)    Passed out after being given this at dental appointment  . Levofloxacin Rash  . Oxycodone Other (See Comments)    constipation    Antimicrobials this admission: Vanc 1/2>> Cefepime 1/2>> Flagyl 1/2>>  Dose adjustments this admission: N/A  Microbiology results: Pending  Thank you for allowing pharmacy to be a part of this patient's care.  Sommer Spickard, Rande Lawman 06/20/2018 9:13 AM

## 2018-06-20 NOTE — H&P (Addendum)
[facility] Admission History and Physical Service Pager: [phone]  Patient name: Wayne [name] Medical record number: [id number] Date of birth: 03-15-1930 Age: 85 y.o. Gender: male  Primary Care Provider: [name], DO Consultants: General Surgery Code Status: DNR  Chief Complaint: sepsis with unknown source   Assessment and Plan: Rigel Filsinger is a 85 y.o. male presenting with weakness and fall. PMH is significant for A. fib on chronic anticoagulation, aortic valve replacement with porcine valve, CAD, CVD, BPH, MI s/p PCI, CVA, HFpEF, and hypertension.  SIRS Temp of 103 with heart rate to 118 and tachypnea to 24 on arrival.  Lowest MAP 65. WBC 5.0.  A&Ox2, does not know year but did remember new years had just passed and christmas was also recently. Wife notes patient is confused.  Lactic Acid 4.45, improved to 1.69 following fluids. S/p 2.5L NS bolus in ED. Received Vanc, cefepime, Flagyl in ED.  qSOFA 3.   Unclear etiology at this time. Can consider pulmonary given symptoms of cough with productive yellow sputum recently. Influenza PCR negative, CXR negative.  Patient also had recent appendicitis that was treated medically and is peritoneal on exam.  High suspicion of cause of sepsis at this time is appendicitis. UA negative and no urinary symptoms.  CT head negative for acute abnormality.  General Surgery consulted by ED, state will likely take patient to surgery tomorrow, hold coumadin, does not require bridge with TAVR. NSQIP risk above average risk for complications including re-admission, discharge to nursing home, and delirium.  RCRI 10.1% risk for moderate risk surgery. Will plan to consult cardiology for cardiac clearance. Recent valve replacement in February so can consider possible endocarditis as source, can consider work up if no other source of infection is found. Given that there is no clear source will plan to continue broad  spectrum coverage until clear source is determined, at which time we can consider narrowing antibiotic coverage.  -admit to telemetry, Dr. Saul Fordyce Service -vitals per floor protocol -Cardiac monitoring -Continuous pulse ox -Fall precautions, up with assistance -Continue Vanc, cefepime, Flagyl (1/2- ) -Follow-up general surgery recommendations -N.p.o. with ice chips -Follow-up blood cultures and urine cx -S/p 30 cc/kg NS bolus, remains hypotensive, order 500 cc NS bolus -NS@ maintenance IV fluids, 120 cc/h -Hold ASA, warfarin pending surgery -consult cardiology for surgical clearance -can consider echocardiogram if no improvement   Appendicits Was treated medically in November during hospitalization.  Finished course of antibiotics.  Repeat CT scan showed improvement in appendicitis, with dilation of 8 mm, improved from 11 mm, continued mild inflammation in RLQ with very small focal fluid collection adjacent to tip of appendix, measures 1.3 cm.  Gen Surg consulted by ED, planning for surgery likely 1/3. -Plan per above  Elbow Injury S/P Fall Patient slid down while in an elbow this morning.  X-ray in ED showed projectional lucency versus subtle nondisplaced fracture.  Patient currently denies pain.   - oblique x-ray of elbow, if positive will call ortho  Atrial Fibrillation with RVR, now resolved HR max 118 on presentation, has improved to 90s with fluids.  Chronically anticoagulated on Coumadin.  INR 2.14.  On metoprolol 25 mg every morning, 12.5 mg nightly and digoxin 125 mcg QD.  Follows with Dr. Stanford Breed.  Most recent Echo 10/2017, EF 55-60%, severely dilated L&R atrium, severe mitral regurg. - hold metoprolol - cont home digoxin - holding anticoagulation - cardiac monitoring - consult cards for cardiac clearance prior to surgery  HFpEF  Last Echo per above.  On Lasix 52m QD chronically, dry on exam.  CXR negative. - hold home Lasix - fluids per above - daily weights - possible  echo tomorrow  TAVR March 2019 On coumadin, INR goal 2-3. On Lasix 282mQD at home.  Last Echo per above. - hold anticoagulation pending surgery   Hx CVA with balance deficit, chronic, stable Cerebellar stroke 2007.  On ASA and coumadin.  CT head negative for acute abnormality.  Functioning well and even drove a car this week.  Wife notes patient has been using a walker recently. - holding ASA and coumadin pending surgery - up with assistance, fall precaution - PT/OT evaluation   Chronic Constipation On colace and miralax at home prn. - holding at present - will restart when can have diet and BP stable  BPH On alfuzosin and finasteride at home.   - cont home finasteride - hold alfuzosin while soft BPs  FEN/GI: NPO with ice chips pending surgery Prophylaxis: SCDs  Disposition: admit to telemetry  History of Present Illness:  [name] a 89.o. male presenting with fever and weakness.   Patient presenting with his wife. History provided by his wife per patient's request as he felt too weak. Around 7AM patient was on his way to the bathroom when he yelled out for help. Eased down to his bottom and then scooted on floor since he was unable to get up. Wife unable to lift him as she is recovering from hip surgery. Has not been feeling febrile at home. No meds or food/drink today.   Prior to today patient was at baseline. Did cough up some phlegm off and on for several weeks mostly at night time. Has been using mucinex prn. Did complain of RLQ abdominal pain today. No recent confusion. At baseline patient is fairly independent, drives a car and walks normally, but wife did note that he has been using a walker recently.   Recent medical treatment for appendicitis in November.   In ED, code sepsis called for patient as met criteria given temp, HR, RR.  Lactic acidosis corrected with fluid administration.  Patient was also noted to be confused and not at mental baseline per  his wife.  Was started on broad spectrum antibiotics.  HR, RR, and BP improved with fluid administration. General surgery consulted and noted that patient will likely go to surgery tomorrow pending clearance from cardiology.  Review Of Systems: Per HPI with the following additions:   Review of Systems  Constitutional: Negative for chills and fever.  Respiratory: Positive for cough. Negative for shortness of breath.   Cardiovascular: Negative for chest pain.  Gastrointestinal: Positive for abdominal pain. Negative for diarrhea, nausea and vomiting.  Genitourinary: Negative for dysuria and frequency.  Neurological: Negative for tingling, weakness and headaches.    Patient Active Problem List   Diagnosis Date Noted  . Sepsis ([facility]01/07/2018  . SIRS (systemic inflammatory response syndrome) ([facility]01/07/2018  . Dyspnea   . Normocytic anemia   . Appendicitis, acute 04/21/2018  . Pain in left knee 02/26/2018  . Pain in right knee 02/26/2018  . Fall 01/21/2018  . Closed fracture of left inferior pubic ramus ([facility]08/10/2017  . Constipation 12/19/2017  .  hernia of linea alba 12/19/2017  . Osteoarthritis of hip 09/24/2017  . Gastroenteritis 09/10/2017  . S/P TAVR (transcatheter aortic valve replacement) 09/04/2017  . Severe mitral regurgitation   . Insomnia 07/10/2017  . High risk medication use  07/10/2017  . Long term current use of anticoagulant 07/10/2017  . Chronic diastolic CHF (congestive heart failure) ([place]) 05/20/2017  . Hyponatremia 05/20/2017  . Severe aortic stenosis 05/20/2017  . Urinary retention   . 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 Spinal stenosis at L4-L5 level 04/26/2017  . Lumbar radiculopathy, chronic 04/26/2017  . Benign colonic polyp- 25 yrs ago.  11/16/2016  . Vitamin D deficiency 11/16/2016  . H/O non anemic vitamin B12 deficiency 11/16/2016  . Chronic Colonic dysmotility 11/16/2016  . Hypokalemia 09/13/2016  . BPH with  obstruction/lower urinary tract symptoms 09/13/2016  . Urinary frequency 07/10/2016  . Left bundle branch block (LBBB) on electrocardiogram 06/30/2016  . History of stroke 06/30/2016  . Generalized weakness 06/30/2016  . Asthenia 06/30/2016  . Osteoarthritis 04/04/2016  . Encounter for therapeutic drug monitoring 08/06/2013  . Chronic anticoagulation   . HTN (hypertension)   . CAD (coronary artery disease)   . Chronic atrial fibrillation 09/13/2010  . h/o CVA (cerebrovascular accident due to intracerebral hemorrhage) 09/13/2010    Past Medical History: Past Medical History:  Diagnosis Date  . Anxiety    "maybe; having trouble at night" (04/24/2018)  . Aortic stenosis   . Arthritis   . Atrial fibrillation, chronic   . BPH (benign prostatic hyperplasia)   . CAD (coronary artery disease)   . Chronic anticoagulation   . Heart murmur   . Hepatitis 1960s   "drank contaminated water" (04/23/2018)  . History of chicken pox   . HTN (hypertension)    "have never had high blood pressure" (04/23/2018)  . Mitral stenosis   . Old MI (myocardial infarction) 2007   s/p PCI to distal OM (no stent)  . Pneumonia    "1 time" (04/23/2018)  . Severe mitral regurgitation   . Stroke, embolic ([place])    9702-6 weeks after his heart attack; "weak all over since" (04/23/2018)    Past Surgical History: Past Surgical History:  Procedure Laterality Date  . CATARACT EXTRACTION W/ INTRAOCULAR LENS IMPLANT Left   . CORONARY ANGIOPLASTY  2007   Distal OM  . PROSTATE BIOPSY    . RIGHT/LEFT HEART CATH AND CORONARY ANGIOGRAPHY N/A 08/01/2017   Procedure: RIGHT/LEFT HEART CATH AND CORONARY ANGIOGRAPHY;  Surgeon: Sherren Mocha, MD;  Location: [facility];  Service: Cardiovascular;  Laterality: N/A;  . TEE WITHOUT CARDIOVERSION N/A 07/20/2017   Procedure: TRANSESOPHAGEAL ECHOCARDIOGRAM (TEE);  Surgeon: Lelon Perla, MD;  Location[facility];  Service: Cardiovascular;  Laterality: N/A;  . TEE  WITHOUT CARDIOVERSION N/A 09/04/2017   Procedure: TRANSESOPHAGEAL ECHOCARDIOGRAM (TEE);  Surgeon: Sherren Mocha, MD;  Location: [facility];  Service: Open Heart Surgery;  Laterality: N/A;  . TRANSCATHETER AORTIC VALVE REPLACEMENT, TRANSFEMORAL N/A 09/04/2017   Procedure: TRANSCATHETER AORTIC VALVE REPLACEMENT, TRANSFEMORAL;  Surgeon: Sherren Mocha, MD;  Location: [facility];  Service: Open Heart Surgery;  Laterality: N/A;    Social History: Social History   Tobacco Use  . Smoking status: Never Smoker  . Smokeless tobacco: Never Used  Substance Use Topics  . Alcohol use: Never    Frequency: Never  . Drug use: Never   Additional social history: no smoking or alcohol use or drug.   Please also refer to relevant sections of EMR.  Family History: Family History  Problem Relation Age of Onset  . Heart disease Mother   . Heart attack Mother   . Heart disease Father   . Heart attack Father   .  Heart attack Brother   . Heart attack Sister   . Stroke Brother    Allergies and Medications: Allergies  Allergen Reactions  . Penicillins Rash and Other (See Comments)    PATIENT HAS HAD A PCN REACTION WITH IMMEDIATE RASH, FACIAL/TONGUE/THROAT SWELLING, SOB, OR LIGHTHEADEDNESS WITH HYPOTENSION:  #  #  #  YES  #  #  #   Has patient had a PCN reaction causing severe rash involving mucus membranes or skin necrosis: No Has patient had a PCN reaction that required hospitalization No Has patient had a PCN reaction occurring within the last 10 years: No If all of the above answers are "NO", then may proceed with Cephalosporin use.  . Prednisone Other (See Comments)    Wheezing  . Procaine Hcl Other (See Comments)    Passed out after being given this at dental appointment  . Levofloxacin Rash  . Oxycodone Other (See Comments)    constipation   No current facility-administered medications on file prior to encounter.    Current Outpatient Medications on File Prior to Encounter  Medication Sig Dispense  Refill  . acetaminophen (TYLENOL) 500 MG tablet Take 500 mg by mouth every 6 (six) hours as needed for moderate pain or headache.     . alfuzosin (UROXATRAL) 10 MG 24 hr tablet Take 10 mg by mouth daily with breakfast.    . ARTIFICIAL TEAR OP Place 1 drop into both eyes daily as needed (dry eyes).    Marland Kitchen aspirin EC 81 MG tablet Take 81 mg by mouth daily.    . digoxin (LANOXIN) 0.125 MG tablet Take 1 tablet (125 mcg total) by mouth daily. 90 tablet 3  . docusate sodium (COLACE) 100 MG capsule Take 100 mg by mouth every evening.    . finasteride (PROSCAR) 5 MG tablet Take 5 mg by mouth at bedtime.     . furosemide (LASIX) 20 MG tablet Take 2 tablets (40 mg total) by mouth daily. IS DUE FOR AN OFFICE VISIT FOR FUTURE REFILLS ./CY (Patient taking differently: Take 40 mg by mouth daily. ) 60 tablet 2  . Melatonin 10 MG TABS Take 10 mg by mouth at bedtime.     . Menthol, Topical Analgesic, (BLUE-EMU MAXIMUM STRENGTH EX) Apply 1 application topically 3 (three) times daily as needed (muscle pain in back). alterrnates between [employer] and Blu Emu    . metoprolol tartrate (LOPRESSOR) 25 MG tablet TAKE ONE TABLET (44m) BY MOUTH IN THE MORNING, THEN TAKE ONE-HALF (12.5100m IN THE EVENING (Patient taking differently: Take 12.5-25 mg by mouth See admin instructions. TAKE ONE TABLET (2518mBY MOUTH IN THE MORNING, THEN TAKE ONE-HALF (12.5mg54mN THE EVENING) 135 tablet 3  . polyethylene glycol (MIRALAX / GLYCOLAX) packet Take 17 g by mouth 2 (two) times daily. (Patient taking differently: Take 17 g by mouth daily as needed for mild constipation. )    . potassium chloride (K-DUR) 10 MEQ tablet Take 5 mEq by mouth daily as needed for fluid. TAKE POTASSIUM IF YOU TAKE LASIX  3  . sodium chloride (OCEAN) 0.65 % SOLN nasal spray Place 1 spray into both nostrils daily as needed for congestion.    . triamcinolone cream (KENALOG) 0.1 % Apply 1 application topically daily as needed (dry skin).     . waMarland Kitchenfarin (COUMADIN) 2.5  MG tablet TAKE 1 TO 2 TABLETS BY MOUTH ONCE DAILY AS  DIRECTED  BY  COUMADIN  CLINIC (Patient taking differently: Take 2.5-5 mg by mouth See  admin instructions. Take 2.28m on Tuesdays and Fridays and all other days take 553m 180 tablet 1  . cholecalciferol 2000 units TABS Take 1 tablet (2,000 Units total) by mouth daily. (Patient not taking: Reported on 06/20/2018) 30 tablet 0  . enoxaparin (LOVENOX) 80 MG/0.8ML injection Inject 0.8 mLs (80 mg total) into the skin every 12 (twelve) hours for 7 days. Stop taking when patient's INR is therapeutic (2-3) x 2. (Patient not taking: Reported on 06/20/2018) 22.4 Syringe 0  . furosemide (LASIX) 40 MG tablet Take 0.5 tablets (20 mg total) by mouth 2 (two) times daily as needed for fluid. (Patient not taking: Reported on 06/20/2018)    . metoprolol tartrate (LOPRESSOR) 25 MG tablet Take 0.5 tablets (12.5 mg total) by mouth 2 (two) times daily. (Patient not taking: Reported on 06/20/2018) 30 tablet 6  . potassium chloride (K-DUR,KLOR-CON) 10 MEQ tablet Take 1 tablet (10 mEq total) by mouth daily. (Patient not taking: Reported on 06/20/2018) 30 tablet 3  . senna (SENOKOT) 8.6 MG TABS tablet Take 2 tablets (17.2 mg total) by mouth daily. (Patient not taking: Reported on 06/20/2018)      Objective: BP (!) 93/58 (BP Location: Right Arm)   Pulse 91   Temp 98.3 F (36.8 C) (Oral)   Resp 19   Ht 5' 10"  (1.778 m)   Wt 81.6 kg   SpO2 99%   BMI 25.83 kg/m   Physical Exam: General: [age].o. male somnolent, but arousable HEENT: NCAT, PERRL, EOMI, MM dry Neck: supple, full ROM Cardio: RRR no m/r/g Lungs: CTAB, no wheezing, no rhonchi, no crackles, no increased work of breathing Abdomen: Soft, very TTP RLQ>LLG, - Rovsing, -Rebound, +BS Skin: warm and dry Extremities: No edema, 2+ dorsalis pedis pulse, 4/5 grip strength, 4/5 strength BLE Neuro: CN II-XII intact, sensation intact throughout   Labs and Imaging: CBC BMET  Recent Labs  Lab 06/20/18 0856  WBC 5.0  HGB  13.0  HCT 41.4  PLT 127*   Recent Labs  Lab 06/20/18 0856  NA 139  K 3.9  CL 103  CO2 23  BUN 14  CREATININE 1.02  GLUCOSE 103*  CALCIUM 8.8*     Dg Elbow 2 Views Left  Result Date: 06/20/2018 CLINICAL DATA:  Status post fall, altered mental status EXAM: LEFT ELBOW - 2 VIEW COMPARISON:  None. FINDINGS: Subtle lucency along the articular surface of the left radial head which may be projectional versus a subtle nondisplaced fracture. No other fracture or dislocation. Minimal osteoarthritis of the ulnohumeral joint. Soft tissues are unremarkable. IMPRESSION: Subtle lucency along the articular surface of the left radial head which may be projectional versus a subtle nondisplaced fracture. Recommend oblique views of the elbow for further evaluation. Electronically Signed   By: HeKathreen Devoid On: 06/20/2018 09:48   Ct Head Wo Contrast  Result Date: 06/20/2018 CLINICAL DATA:  Head trauma. EXAM: CT HEAD WITHOUT CONTRAST TECHNIQUE: Contiguous axial images were obtained from the base of the skull through the vertex without intravenous contrast. COMPARISON:  CT scan of January 21, 2018. FINDINGS: Brain: Mild diffuse cortical atrophy is noted. Mild chronic ischemic white matter disease is noted. No mass effect or midline shift is noted. Ventricular size is within normal limits. There is no evidence of mass lesion, hemorrhage or acute infarction. Vascular: No hyperdense vessel or unexpected calcification. Skull: Normal. Negative for fracture or focal lesion. Sinuses/Orbits: No acute finding. Other: None. IMPRESSION: Mild diffuse cortical atrophy. Mild chronic ischemic white matter  disease. No acute intracranial abnormality seen. Electronically Signed   By: Marijo Conception, M.D.   On: 06/20/2018 11:34   Ct Abdomen Pelvis W Contrast  Result Date: 06/20/2018 CLINICAL DATA:  Generalized abdominal pain with fever EXAM: CT ABDOMEN AND PELVIS WITH CONTRAST TECHNIQUE: Multidetector CT imaging of the abdomen and  pelvis was performed using the standard protocol following bolus administration of intravenous contrast. CONTRAST:  134m OMNIPAQUE IOHEXOL 300 MG/ML  SOLN COMPARISON:  06/21/2017 FINDINGS: Lower chest: Cardiomegaly.  Dependent atelectasis.  No effusions. Hepatobiliary: No focal hepatic abnormality. Gallbladder unremarkable. Pancreas: No focal abnormality or ductal dilatation. Spleen: No focal abnormality.  Normal size. Adrenals/Urinary Tract: No hydronephrosis. Small cyst in the midpole of the left kidney. Adrenal glands unremarkable. Left posterior bladder wall diverticulum measures 4 cm, stable. Stomach/Bowel: Appendix remains borderline dilated at 8 mm. There is a small fluid collection adjacent to the tip of the appendix in the area of prior phlegmonous change, measuring maximally 1.3 cm. Mild surrounding inflammation in the right lower quadrant. No evidence of bowel obstruction. Few scattered sigmoid diverticula. Vascular/Lymphatic: Aortic atherosclerosis. No enlarged abdominal or pelvic lymph nodes. Reproductive: Mildly prominent prostate with central calcifications. Other: No free fluid or free air. Musculoskeletal: No acute bony abnormality. IMPRESSION: Continued mild inflammation in the right lower quadrant adjacent to the appendix which is mildly dilated at 8 mm compared with 11 mm previously. Very small focal fluid collection adjacent to the tip of the appendix measures maximally 1.3 cm. This is too small to drain. Aortic atherosclerosis. Prostate prominence with left posterior bladder wall diverticulum, stable. Electronically Signed   By: KRolm BaptiseM.D.   On: 06/20/2018 11:34   Dg Chest Port 1 View  Result Date: 06/20/2018 CLINICAL DATA:  Pain following fall EXAM: PORTABLE CHEST 1 VIEW COMPARISON:  April 23, 2018 FINDINGS: There is no edema or consolidation. Heart is slightly enlarged, stable, with pulmonary vascularity normal. Patient is status post aortic valve replacement. There is aortic  atherosclerosis. No adenopathy evident. There is degenerative change in each shoulder. IMPRESSION: No edema or consolidation. Mild cardiomegaly, stable. Status post aortic valve replacement. There is aortic atherosclerosis. Aortic Atherosclerosis (ICD10-I70.0). Electronically Signed   By: WLowella GripIII M.D.   On: 06/20/2018 09:46    Meccariello, BBernita Raisin DO 06/20/2018, 4:48 PM PGY-1, CRocky PointIntern pager: [phone] text pages welcome   FPTS Upper-Level Resident Addendum   I have independently interviewed and examined the patient. I have discussed the above with the original author and agree with their documentation. My edits for correction/addition/clarification are in blue. Please see also any attending notes.   SCaroline More DO PGY-2, CAnchorageFamily Medicine 06/20/2018 5:50 PM  FGoltryService pager: [phone](text pages welcome through [place]aux [place]

## 2018-06-20 NOTE — ED Provider Notes (Signed)
[facility] EMERGENCY DEPARTMENT Provider Note   CSN: [id number] Arrival date & time: 06/20/18  7741     History   Chief Complaint Chief Complaint  Patient presents with  . Fall    HPI Wayne [name] is a 85 y.o. male who presents with a fall. PMH significant for chronic atrial fibrillation on Coumadin, CAD, prior CVA, aortic stenosis status post TAVR, frequent falls with pelvic fracture. The patient is a poor historian. He states that he was getting out of bed this morning and fell. He cannot tell me why he fell. He tried to catch himself and injured his left elbow. He reports abdominal pain which is generalized but worse in the RLQ. He has a hx of appendicitis which was treated medically. He denies head injury, neck pain, back pain, chest pain, SOB, lower extremity pain. The patient's wife, Stanton Kidney, who is at bedside states that he has been coughing up phlegm for the past couple days. He has not had a flu shot.  LEVEL 5 caveat due to confusion/delirium  HPI  Past Medical History:  Diagnosis Date  . Anxiety    "maybe; having trouble at night" (04/24/2018)  . Aortic stenosis   . Arthritis   . Atrial fibrillation, chronic   . BPH (benign prostatic hyperplasia)   . CAD (coronary artery disease)   . Chronic anticoagulation   . Heart murmur   . Hepatitis 1960s   "drank contaminated water" (04/23/2018)  . History of chicken pox   . HTN (hypertension)    "have never had high blood pressure" (04/23/2018)  . Mitral stenosis   . Old MI (myocardial infarction) 2007   s/p PCI to distal OM (no stent)  . Pneumonia    "1 time" (04/23/2018)  . Severe mitral regurgitation   . 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 Dyspnea   . Normocytic anemia   . Appendicitis, acute 04/21/2018  . Pain in left knee 02/26/2018  . Pain in right knee 02/26/2018  .  Fall 01/21/2018  . Closed fracture of left inferior pubic ramus ([place]) 01/21/2018  . Constipation 12/19/2017  .  hernia of linea alba 12/19/2017  . Osteoarthritis of hip 09/24/2017  . Gastroenteritis 09/10/2017  . S/P TAVR (transcatheter aortic valve replacement) 09/04/2017  . Severe mitral regurgitation   . Insomnia 07/10/2017  . High risk medication use 07/10/2017  . Long term current use of anticoagulant 07/10/2017  . Chronic diastolic CHF (congestive heart failure) ([place]) 05/20/2017  . Hyponatremia 05/20/2017  . Severe aortic stenosis 05/20/2017  . Urinary retention   . 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 Spinal stenosis at L4-L5 level 04/26/2017  . Lumbar radiculopathy, chronic 04/26/2017  . Benign colonic polyp- 25 yrs ago.  11/16/2016  . Vitamin D deficiency 11/16/2016  . H/O non anemic vitamin B12 deficiency 11/16/2016  . Chronic Colonic dysmotility 11/16/2016  . Hypokalemia 09/13/2016  . BPH with obstruction/lower urinary tract symptoms 09/13/2016  . Urinary frequency 07/10/2016  . Left bundle branch block (LBBB) on electrocardiogram 06/30/2016  . History of stroke 06/30/2016  . Generalized weakness 06/30/2016  . Asthenia 06/30/2016  . Osteoarthritis 04/04/2016  . Encounter for therapeutic drug monitoring 08/06/2013  . Chronic anticoagulation   . HTN (hypertension)   . CAD (coronary artery disease)   . Chronic atrial fibrillation 09/13/2010  .  h/o CVA (cerebrovascular accident due to intracerebral hemorrhage) 09/13/2010    Past Surgical History:  Procedure Laterality Date  . CATARACT EXTRACTION W/ INTRAOCULAR LENS IMPLANT Left   . CORONARY ANGIOPLASTY  2007   Distal OM  . PROSTATE BIOPSY    . RIGHT/LEFT HEART CATH AND CORONARY ANGIOGRAPHY N/A 08/01/2017   Procedure: RIGHT/LEFT HEART CATH AND CORONARY ANGIOGRAPHY;  Surgeon: Sherren Mocha, MD;  Location: [facility];  Service: Cardiovascular;  Laterality: N/A;  . TEE WITHOUT  CARDIOVERSION N/A 07/20/2017   Procedure: TRANSESOPHAGEAL ECHOCARDIOGRAM (TEE);  Surgeon: Lelon Perla, MD;  Location[facility];  Service: Cardiovascular;  Laterality: N/A;  . TEE WITHOUT CARDIOVERSION N/A 09/04/2017   Procedure: TRANSESOPHAGEAL ECHOCARDIOGRAM (TEE);  Surgeon: Sherren Mocha, MD;  Location: [facility];  Service: Open Heart Surgery;  Laterality: N/A;  . TRANSCATHETER AORTIC VALVE REPLACEMENT, TRANSFEMORAL N/A 09/04/2017   Procedure: TRANSCATHETER AORTIC VALVE REPLACEMENT, TRANSFEMORAL;  Surgeon: Sherren Mocha, MD;  Location: [facility];  Service: Open Heart Surgery;  Laterality: N/A;        Home Medications    Prior to Admission medications   Medication Sig Start Date End Date Taking? Authorizing Provider  acetaminophen (TYLENOL) 500 MG tablet Take 1,000 mg by mouth every 6 (six) hours as needed for moderate pain or headache.    [provider]  alfuzosin (UROXATRAL) 10 MG 24 hr tablet Take 10 mg by mouth daily with breakfast.    [provider]  ARTIFICIAL TEAR OP Place 1 drop into both eyes daily as needed (dry eyes).    [provider]  cholecalciferol 2000 units TABS Take 1 tablet (2,000 Units total) by mouth daily. 05/24/17   Regalado, Belkys A, MD  digoxin (LANOXIN) 0.125 MG tablet Take 1 tablet (125 mcg total) by mouth daily. 09/18/17   Lendon Colonel, NP  docusate sodium (COLACE) 100 MG capsule Take 100 mg by mouth every evening.    [provider]  enoxaparin (LOVENOX) 80 MG/0.8ML injection Inject 0.8 mLs (80 mg total) into the skin every 12 (twelve) hours for 7 days. Stop taking when patient's INR is therapeutic (2-3) x 2. 04/26/18 05/03/18  Kathrene Alu, MD  finasteride (PROSCAR) 5 MG tablet Take 5 mg by mouth at bedtime.  02/14/17   Lelon Perla, MD  furosemide (LASIX) 20 MG tablet Take 2 tablets (40 mg total) by mouth daily. IS DUE FOR AN OFFICE VISIT FOR FUTURE REFILLS .Adonis Housekeeper 05/15/18   Lelon Perla, MD  furosemide  (LASIX) 40 MG tablet Take 0.5 tablets (20 mg total) by mouth 2 (two) times daily as needed for fluid. 01/28/18   Hongalgi, Lenis Dickinson, MD  Melatonin 10 MG TABS Take 10 mg by mouth at bedtime.     [provider]  Menthol, Topical Analgesic, (BIOFREEZE EX) Apply 1 application topically 3 (three) times daily as needed (muscle pain in back). alterrnates between [employer] and Blu Emu    [provider]  Menthol, Topical Analgesic, (BLUE-EMU MAXIMUM STRENGTH EX) Apply 1 application topically 3 (three) times daily as needed (muscle pain in back). alterrnates between [employer] and [employer]mu    [provider]  metoprolol tartrate (LOPRESSOR) 25 MG tablet Take 0.5 tablets (12.5 mg total) by mouth 2 (two) times daily. Patient taking differently: Take 12.5-25 mg by mouth 2 (two) times daily. Take two half tablets (25mg ) by mouth every morning and one-half (12.5mg ) tablet at bedtime. 09/18/17   Lendon Colonel, NP  metoprolol  tartrate (LOPRESSOR) 25 MG tablet TAKE ONE TABLET (25mg ) BY MOUTH IN THE MORNING, THEN TAKE ONE-HALF (12.5mg ) IN THE EVENING 05/29/18   Lelon Perla, MD  polyethylene glycol (MIRALAX / GLYCOLAX) packet Take 17 g by mouth 2 (two) times daily. Patient taking differently: Take 17 g by mouth daily as needed for mild constipation.  01/28/18   Hongalgi, Lenis Dickinson, MD  potassium chloride (K-DUR) 10 MEQ tablet Take 5 mEq by mouth daily as needed for fluid. TAKE POTASSIUM IF YOU TAKE LASIX 04/16/18   [provider]  potassium chloride (K-DUR,KLOR-CON) 10 MEQ tablet Take 1 tablet (10 mEq total) by mouth daily. 09/18/17   Lendon Colonel, NP  senna (SENOKOT) 8.6 MG TABS tablet Take 2 tablets (17.2 mg total) by mouth daily. 01/29/18   Hongalgi, Lenis Dickinson, MD  sodium chloride (OCEAN) 0.65 % SOLN nasal spray Place 1 spray into both nostrils daily as needed for congestion.    [provider]  triamcinolone cream (KENALOG) 0.1 % Apply 1 application topically daily  as needed (dry skin).     [provider]  warfarin (COUMADIN) 2.5 MG tablet TAKE 1 TO 2 TABLETS BY MOUTH ONCE DAILY AS  DIRECTED  BY  COUMADIN  CLINIC Patient taking differently: Take 2.5-5 mg by mouth See admin instructions. Take 2.5mg  on Tuesdays and all other days take 5mg  04/16/18   Lelon Perla, MD    Family History Family History  Problem Relation Age of Onset  . Heart disease Mother   . Heart attack Mother   . Heart disease Father   . Heart attack Father   . Heart attack Brother   . Heart attack Sister   . Stroke Brother     Social History Social History   Tobacco Use  . Smoking status: Never Smoker  . Smokeless tobacco: Never Used  Substance Use Topics  . Alcohol use: Never    Frequency: Never  . Drug use: Never     Allergies   Penicillins; Prednisone; Procaine hcl; Levofloxacin; and Oxycodone   Review of Systems Review of Systems  Unable to perform ROS: Mental status change     Physical Exam Updated Vital Signs BP (!) 126/59   Pulse (!) 109   Temp (S) (!) 103 F (39.4 C) (Rectal)   Resp (!) 27   Ht 5\' 10"  (1.778 m)   Wt 81.6 kg   SpO2 96%   BMI 25.83 kg/m   Physical Exam Vitals signs and nursing note reviewed.  Constitutional:      General: He is not in acute distress.    Appearance: He is well-developed, well-groomed and normal weight.     Comments: Responds to voice. Sleepy. Seems delirious  HENT:     Head: Normocephalic and atraumatic.  Eyes:     General: No scleral icterus.       Right eye: No discharge.        Left eye: No discharge.     Conjunctiva/sclera: Conjunctivae normal.     Pupils: Pupils are equal, round, and reactive to light.  Neck:     Musculoskeletal: Normal range of motion.  Cardiovascular:     Rate and Rhythm: Tachycardia present. Rhythm regularly irregular.     Heart sounds: Murmur present.  Pulmonary:     Effort: Pulmonary effort is normal. No respiratory distress.     Breath sounds: Normal breath  sounds.  Abdominal:     General: There is no distension.  Palpations: Abdomen is soft.     Tenderness: There is abdominal tenderness (generalized, worse in RLQ).  Musculoskeletal:     Comments: Skin tear over left elbow. No tenderness of elbow  Skin:    General: Skin is warm and dry.  Neurological:     Mental Status: He is oriented to person, place, and time and easily aroused. He is lethargic.  Psychiatric:        Behavior: Behavior normal.      ED Treatments / Results  Labs (all labs ordered are listed, but only abnormal results are displayed) Labs Reviewed  CBC WITH DIFFERENTIAL/PLATELET - Abnormal; Notable for the following components:      Result Value   RDW 15.8 (*)    Platelets 127 (*)    All other components within normal limits  COMPREHENSIVE METABOLIC PANEL - Abnormal; Notable for the following components:   Glucose, Bld 103 (*)    Calcium 8.8 (*)    All other components within normal limits  PROTIME-INR - Abnormal; Notable for the following components:   Prothrombin Time 23.6 (*)    All other components within normal limits  I-STAT CG4 LACTIC ACID, ED - Abnormal; Notable for the following components:   Lactic Acid, Venous 4.45 (*)    All other components within normal limits  CULTURE, BLOOD (ROUTINE X 2)  CULTURE, BLOOD (ROUTINE X 2)  URINE CULTURE  LIPASE, BLOOD  URINALYSIS, ROUTINE W REFLEX MICROSCOPIC  INFLUENZA PANEL BY PCR (TYPE A & B)  I-STAT CG4 LACTIC ACID, ED    EKG EKG Interpretation  Date/Time:  Thursday June 20 2018 08:44:17 EST Ventricular Rate:  115 PR Interval:    QRS Duration: 98 QT Interval:  314 QTC Calculation: 435 R Axis:   75 Text Interpretation:  Atrial fibrillation Borderline T abnormalities, lateral leads Confirmed by Quintella Reichert [phone]) on 06/20/2018 9:02:27 AM   Radiology Dg Elbow 2 Views Left  Result Date: 06/20/2018 CLINICAL DATA:  Status post fall, altered mental status EXAM: LEFT ELBOW - 2 VIEW COMPARISON:   None. FINDINGS: Subtle lucency along the articular surface of the left radial head which may be projectional versus a subtle nondisplaced fracture. No other fracture or dislocation. Minimal osteoarthritis of the ulnohumeral joint. Soft tissues are unremarkable. IMPRESSION: Subtle lucency along the articular surface of the left radial head which may be projectional versus a subtle nondisplaced fracture. Recommend oblique views of the elbow for further evaluation. Electronically Signed   By: Kathreen Devoid   On: 06/20/2018 09:48   Ct Head Wo Contrast  Result Date: 06/20/2018 CLINICAL DATA:  Head trauma. EXAM: CT HEAD WITHOUT CONTRAST TECHNIQUE: Contiguous axial images were obtained from the base of the skull through the vertex without intravenous contrast. COMPARISON:  CT scan of January 21, 2018. FINDINGS: Brain: Mild diffuse cortical atrophy is noted. Mild chronic ischemic white matter disease is noted. No mass effect or midline shift is noted. Ventricular size is within normal limits. There is no evidence of mass lesion, hemorrhage or acute infarction. Vascular: No hyperdense vessel or unexpected calcification. Skull: Normal. Negative for fracture or focal lesion. Sinuses/Orbits: No acute finding. Other: None. IMPRESSION: Mild diffuse cortical atrophy. Mild chronic ischemic white matter disease. No acute intracranial abnormality seen. Electronically Signed   By: Marijo Conception, M.D.   On: 06/20/2018 11:34   Ct Abdomen Pelvis W Contrast  Result Date: 06/20/2018 CLINICAL DATA:  Generalized abdominal pain with fever EXAM: CT ABDOMEN AND PELVIS WITH CONTRAST TECHNIQUE: Multidetector CT  imaging of the abdomen and pelvis was performed using the standard protocol following bolus administration of intravenous contrast. CONTRAST:  143mL OMNIPAQUE IOHEXOL 300 MG/ML  SOLN COMPARISON:  06/21/2017 FINDINGS: Lower chest: Cardiomegaly.  Dependent atelectasis.  No effusions. Hepatobiliary: No focal hepatic abnormality.  Gallbladder unremarkable. Pancreas: No focal abnormality or ductal dilatation. Spleen: No focal abnormality.  Normal size. Adrenals/Urinary Tract: No hydronephrosis. Small cyst in the midpole of the left kidney. Adrenal glands unremarkable. Left posterior bladder wall diverticulum measures 4 cm, stable. Stomach/Bowel: Appendix remains borderline dilated at 8 mm. There is a small fluid collection adjacent to the tip of the appendix in the area of prior phlegmonous change, measuring maximally 1.3 cm. Mild surrounding inflammation in the right lower quadrant. No evidence of bowel obstruction. Few scattered sigmoid diverticula. Vascular/Lymphatic: Aortic atherosclerosis. No enlarged abdominal or pelvic lymph nodes. Reproductive: Mildly prominent prostate with central calcifications. Other: No free fluid or free air. Musculoskeletal: No acute bony abnormality. IMPRESSION: Continued mild inflammation in the right lower quadrant adjacent to the appendix which is mildly dilated at 8 mm compared with 11 mm previously. Very small focal fluid collection adjacent to the tip of the appendix measures maximally 1.3 cm. This is too small to drain. Aortic atherosclerosis. Prostate prominence with left posterior bladder wall diverticulum, stable. Electronically Signed   By: Rolm Baptise M.D.   On: 06/20/2018 11:34   Dg Chest Port 1 View  Result Date: 06/20/2018 CLINICAL DATA:  Pain following fall EXAM: PORTABLE CHEST 1 VIEW COMPARISON:  April 23, 2018 FINDINGS: There is no edema or consolidation. Heart is slightly enlarged, stable, with pulmonary vascularity normal. Patient is status post aortic valve replacement. There is aortic atherosclerosis. No adenopathy evident. There is degenerative change in each shoulder. IMPRESSION: No edema or consolidation. Mild cardiomegaly, stable. Status post aortic valve replacement. There is aortic atherosclerosis. Aortic Atherosclerosis (ICD10-I70.0). Electronically Signed   By: Lowella Grip III M.D.   On: 06/20/2018 09:46    Procedures Procedures (including critical care time)  CRITICAL CARE Performed by: Recardo Evangelist   Total critical care time: 35 minutes  Critical care time was exclusive of separately billable procedures and treating other patients.  Critical care was necessary to treat or prevent imminent or life-threatening deterioration.  Critical care was time spent personally by me on the following activities: development of treatment plan with patient and/or surrogate as well as nursing, discussions with consultants, evaluation of patient's response to treatment, examination of patient, obtaining history from patient or surrogate, ordering and performing treatments and interventions, ordering and review of laboratory studies, ordering and review of radiographic studies, pulse oximetry and re-evaluation of patient's condition.   Medications Ordered in ED Medications  metroNIDAZOLE (FLAGYL) IVPB 500 mg (0 mg Intravenous Stopped 06/20/18 1132)  vancomycin (VANCOCIN) 1,500 mg in sodium chloride 0.9 % 500 mL IVPB (has no administration in time range)  ceFEPIme (MAXIPIME) 2 g in sodium chloride 0.9 % 100 mL IVPB (has no administration in time range)  acetaminophen (TYLENOL) suppository 650 mg (650 mg Rectal Given 06/20/18 1027)  ceFEPIme (MAXIPIME) 2 g in sodium chloride 0.9 % 100 mL IVPB (0 g Intravenous Stopped 06/20/18 1024)  vancomycin (VANCOCIN) 1,500 mg in sodium chloride 0.9 % 500 mL IVPB (0 mg Intravenous Stopped 06/20/18 1316)  sodium chloride 0.9 % bolus 1,000 mL (0 mLs Intravenous Stopped 06/20/18 1024)    And  sodium chloride 0.9 % bolus 1,000 mL (0 mLs Intravenous Stopped 06/20/18 1126)    And  sodium chloride 0.9 % bolus 500 mL (0 mLs Intravenous Stopped 06/20/18 1228)  iohexol (OMNIPAQUE) 300 MG/ML solution 100 mL (100 mLs Intravenous Contrast Given 06/20/18 1127)     Initial Impression / Assessment and Plan / ED Course  I have reviewed the triage  vital signs and the nursing notes.  Pertinent labs & imaging results that were available during my care of the patient were reviewed by me and considered in my medical decision making (see chart for details).  85 year old male presents with a fall and fever. Rectal temp is 103 here. He is in A.fib with RVR. This has improved after resusitation. On exam he is confused. Lungs are CTA. Abodomen is soft, and generally tender but worse in RLQ. No other obvious signs of infection on exam. His wife states he's been coughing for a couple days. Remarkably his CBC is normal. CMP is normal. UA is not concerning for UTI. Initial lactic acid is 4.45. This has cleared on recheck. INR is therapeutic. CXR is negative. CT of the abdomen/pelvis shows persistent inflammation of the appendix, although it appears improved. His left elbow xray shows possible fracture. Pt is not tender in this area so doubt this. He was covered with broad spectrum antibiotics. Blood cultures were obtained. Vitals have improved after fluids. Shared visit with Dr. Ralene Bathe. Discussed with general surgery to have them come re-evaluate. Pt was admitted to family practice.  Final Clinical Impressions(s) / ED Diagnoses   Final diagnoses:  Sepsis, due to unspecified organism, unspecified whether acute organ dysfunction present [facility])    ED Discharge Orders    None       Recardo Evangelist, PA-C 06/20/18 [place]    Quintella Reichert, MD 06/24/18 1009

## 2018-06-20 NOTE — Progress Notes (Signed)
06/20/2018 patient transfer from the emergency room to 2W. He is alert, oriented to person time and place. Patient have bruises on bilateral arms, skin tear on the left elbow and forearm. Patient was placed on telemetry monitor and central telemetry was called. [facility] RN.

## 2018-06-20 NOTE — Consult Note (Addendum)
Cardiology Consultation:   Patient ID: Detavious Rinn MRN: [id number]; DOB: 04-07-1930  Admit date: 06/20/2018 Date of Consult: 06/20/2018  Primary Care Provider: [name], DO Primary Cardiologist: Kirk Ruths, MD  Primary Electrophysiologist:  None    Patient Profile:   Wayne [name] is a 85 y.o. male with a hx of CAD, chronic atrial fibrillation on coumadin, severe MR medically managed, severe AS s/p TAVR 08/2017, and pulmonary HTN who is being seen today for preoperative clearance at the request of Dr. Owens Shark.  History of Present Illness:   Wayne [name]  is a 85 y.o. male with a hx of CAD, chronic atrial fibrillation on coumadin, severe MR medically managed, severe AS s/p TAVR 08/2017, and pulmonary HTN.  Last cardiac catheterization in February 2019 showed minimal coronary artery disease, moderate pulmonary hypertension, large V waves consistent with severe mitral regurgitation.  Transesophageal echo obtained in February 2019 showed hypokinesis of the inferolateral wall with normal EF, severe aortic stenosis, probable ruptured cord of anterior mitral valve leaflet with severe MR, severe LAE, moderate right ventricular enlargement and a small pericardial effusion.  Carotid Doppler prior to TAVR showed 1 to 39% bilateral stenosis.  Patient underwent TAVR in March 2019.  He was felt not to be a candidate for mitral clip, therefore severe mitral regurgitation was managed medically.  Echocardiogram in May 2019 showed normal LV function, stable aortic valve replacement with trace aortic insufficiency, prolapse of mitral valve with severe MR, severe biatrial enlargement and mild tricuspid regurgitation.  His last visit with Dr. Stanford Breed was on 12/31/2017.  He was admitted in November 2019 with abdominal pain.  CT scan revealed acute appendicitis and the surgery was consulted.  He was given vitamin K for rapid reversal of Coumadin in anticipation of surgery, however patient's  symptom improved with IV antibiotic therefore it was decided to medically manage his condition.  Since his discharge, according to family members, he has been slowly but surely recovering.  He was able to drive around and walk around at home without any issue.  This morning, he woke up with significant high fever of 102 and altered mental status prompting the family to send the patient to [facility] for reevaluation.  He was also complaining of some productive cough recently.  However the primary culprit likely contributed to the fever and altered mental status was felt to be appendicitis.  Initial lactic acid was high at greater than 4.  CT of abdomen and pelvis showed continued mild inflammation in the right lower quadrant adjacent to the appendix which is mildly dilated at 8 mm, however improved compared to the 11 mm previously.  There was a very small focal fluid collection adjacent to the tip of the appendix measuring maximally at 1.3 cm.  Cardiology service was consulted for preop clearance.  Patient has been seen by general surgery, at this time it is unclear to general surgery whether his condition is truly related to appendicitis, therefore has continued observation.   Past Medical History:  Diagnosis Date  . Anxiety    "maybe; having trouble at night" (04/24/2018)  . Aortic stenosis   . Arthritis   . Atrial fibrillation, chronic   . BPH (benign prostatic hyperplasia)   . CAD (coronary artery disease)   . Chronic anticoagulation   . Heart murmur   . Hepatitis 1960s   "drank contaminated water" (04/23/2018)  . History of chicken pox   . HTN (hypertension)    "have never had high blood  pressure" (04/23/2018)  . Mitral stenosis   . Old MI (myocardial infarction) 2007   s/p PCI to distal OM (no stent)  . Pneumonia    "1 time" (04/23/2018)  . Severe mitral regurgitation   . 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 CATARACT EXTRACTION W/ INTRAOCULAR LENS IMPLANT Left   . CORONARY ANGIOPLASTY  2007   Distal OM  . PROSTATE BIOPSY    . RIGHT/LEFT HEART CATH AND CORONARY ANGIOGRAPHY N/A 08/01/2017   Procedure: RIGHT/LEFT HEART CATH AND CORONARY ANGIOGRAPHY;  Surgeon: Sherren Mocha, MD;  Location: [place] [facility];  Service: Cardiovascular;  Laterality: N/A;  . TEE WITHOUT CARDIOVERSION N/A 07/20/2017   Procedure: TRANSESOPHAGEAL ECHOCARDIOGRAM (TEE);  Surgeon: Lelon Perla, MD;  Location: [facility] ENDOSCOPY;  Service: Cardiovascular;  Laterality: N/A;  . TEE WITHOUT CARDIOVERSION N/A 09/04/2017   Procedure: TRANSESOPHAGEAL ECHOCARDIOGRAM (TEE);  Surgeon: Sherren Mocha, MD;  Location: [place];  Service: Open Heart Surgery;  Laterality: N/A;  . TRANSCATHETER AORTIC VALVE REPLACEMENT, TRANSFEMORAL N/A 09/04/2017   Procedure: TRANSCATHETER AORTIC VALVE REPLACEMENT, TRANSFEMORAL;  Surgeon: Sherren Mocha, MD;  Location: [place];  Service: Open Heart Surgery;  Laterality: N/A;     Home Medications:  Prior to Admission medications   Medication Sig Start Date End Date Taking? Authorizing Provider  acetaminophen (TYLENOL) 500 MG tablet Take 500 mg by mouth every 6 (six) hours as needed for moderate pain or headache.    Yes [provider]  alfuzosin (UROXATRAL) 10 MG 24 hr tablet Take 10 mg by mouth daily with breakfast.   Yes [provider]  ARTIFICIAL TEAR OP Place 1 drop into both eyes daily as needed (dry eyes).   Yes [provider]  aspirin EC 81 MG tablet Take 81 mg by mouth daily.   Yes [provider]  digoxin (LANOXIN) 0.125 MG tablet Take 1 tablet (125 mcg total) by mouth daily. 09/18/17  Yes Lendon Colonel, NP  docusate sodium (COLACE) 100 MG capsule Take 100 mg by mouth every evening.   Yes [provider]  finasteride (PROSCAR) 5 MG tablet Take 5 mg by mouth at bedtime.  02/14/17  Yes Lelon Perla, MD    furosemide (LASIX) 20 MG tablet Take 2 tablets (40 mg total) by mouth daily. IS DUE FOR AN OFFICE VISIT FOR FUTURE REFILLS ./CY Patient taking differently: Take 40 mg by mouth daily.  05/15/18  Yes Lelon Perla, MD  Melatonin 10 MG TABS Take 10 mg by mouth at bedtime.    Yes [provider]  Menthol, Topical Analgesic, (BLUE-EMU MAXIMUM STRENGTH EX) Apply 1 application topically 3 (three) times daily as needed (muscle pain in back). alterrnates between [employer] and Blu Emu   Yes [provider]  metoprolol tartrate (LOPRESSOR) 25 MG tablet TAKE ONE TABLET (25mg ) BY MOUTH IN THE MORNING, THEN TAKE ONE-HALF (12.5mg ) IN THE EVENING Patient taking differently: Take 12.5-25 mg by mouth See admin instructions. TAKE ONE TABLET (25mg ) BY MOUTH IN THE MORNING, THEN TAKE ONE-HALF (12.5mg ) IN THE EVENING 05/29/18  Yes Crenshaw, Denice Bors, MD  polyethylene glycol (MIRALAX / GLYCOLAX) packet Take 17 g by mouth 2 (two) times daily. Patient taking differently: Take 17 g by mouth daily as needed for mild constipation.  01/28/18  Yes Hongalgi, Lenis Dickinson, MD  potassium chloride (K-DUR) 10 MEQ tablet Take 5  mEq by mouth daily as needed for fluid. TAKE POTASSIUM IF YOU TAKE LASIX 04/16/18  Yes [provider]  sodium chloride (OCEAN) 0.65 % SOLN nasal spray Place 1 spray into both nostrils daily as needed for congestion.   Yes [provider]  triamcinolone cream (KENALOG) 0.1 % Apply 1 application topically daily as needed (dry skin).    Yes [provider]  warfarin (COUMADIN) 2.5 MG tablet TAKE 1 TO 2 TABLETS BY MOUTH ONCE DAILY AS  DIRECTED  BY  COUMADIN  CLINIC Patient taking differently: Take 2.5-5 mg by mouth See admin instructions. Take 2.5mg  on Tuesdays and Fridays and all other days take 5mg  04/16/18  Yes Crenshaw, Denice Bors, MD  cholecalciferol 2000 units TABS Take 1 tablet (2,000 Units total) by mouth daily. Patient not taking: Reported on 06/20/2018 05/24/17    Regalado, Jerald Kief A, MD  enoxaparin (LOVENOX) 80 MG/0.8ML injection Inject 0.8 mLs (80 mg total) into the skin every 12 (twelve) hours for 7 days. Stop taking when patient's INR is therapeutic (2-3) x 2. Patient not taking: Reported on 06/20/2018 04/26/18 05/03/18  Kathrene Alu, MD  furosemide (LASIX) 40 MG tablet Take 0.5 tablets (20 mg total) by mouth 2 (two) times daily as needed for fluid. Patient not taking: Reported on 06/20/2018 01/28/18   Modena Jansky, MD  metoprolol tartrate (LOPRESSOR) 25 MG tablet Take 0.5 tablets (12.5 mg total) by mouth 2 (two) times daily. Patient not taking: Reported on 06/20/2018 09/18/17   Lendon Colonel, NP  potassium chloride (K-DUR,KLOR-CON) 10 MEQ tablet Take 1 tablet (10 mEq total) by mouth daily. Patient not taking: Reported on 06/20/2018 09/18/17   Lendon Colonel, NP  senna (SENOKOT) 8.6 MG TABS tablet Take 2 tablets (17.2 mg total) by mouth daily. Patient not taking: Reported on 06/20/2018 01/29/18   Modena Jansky, MD    Inpatient Medications: Scheduled Meds: . digoxin  125 mcg Oral Daily  . finasteride  5 mg Oral QHS  . Melatonin  9 mg Oral QHS   Continuous Infusions: . sodium chloride 120 mL/hr at 06/20/18 1631  . ceFEPime (MAXIPIME) IV    . metronidazole 500 mg (06/20/18 1811)  . [START ON 06/21/2018] vancomycin     PRN Meds: acetaminophen **OR** acetaminophen  Allergies:    Allergies  Allergen Reactions  . Penicillins Rash and Other (See Comments)    PATIENT HAS HAD A PCN REACTION WITH IMMEDIATE RASH, FACIAL/TONGUE/THROAT SWELLING, SOB, OR LIGHTHEADEDNESS WITH HYPOTENSION:  #  #  #  YES  #  #  #   Has patient had a PCN reaction causing severe rash involving mucus membranes or skin necrosis: No Has patient had a PCN reaction that required hospitalization No Has patient had a PCN reaction occurring within the last 10 years: No If all of the above answers are "NO", then may proceed with Cephalosporin use.  . Prednisone Other (See  Comments)    Wheezing  . Procaine Hcl Other (See Comments)    Passed out after being given this at dental appointment  . Levofloxacin Rash  . Oxycodone Other (See Comments)    constipation    Social History:   Social History   Socioeconomic History  . Marital status: Married    Spouse name: Not on file  . Number of children: 3  . Years of education: 28  . Highest education level: Not on file  Occupational History  . Not on file  Social Needs  . Financial  resource strain: Not on file  . Food insecurity:    Worry: Not on file    Inability: Not on file  . Transportation needs:    Medical: Not on file    Non-medical: Not on file  Tobacco Use  . Smoking status: Never Smoker  . Smokeless tobacco: Never Used  Substance and Sexual Activity  . Alcohol use: Never    Frequency: Never  . Drug use: Never  . Sexual activity: Not on file  Lifestyle  . Physical activity:    Days per week: Not on file    Minutes per session: Not on file  . Stress: Not on file  Relationships  . Social connections:    Talks on phone: Not on file    Gets together: Not on file    Attends religious service: Not on file    Active member of club or organization: Not on file    Attends meetings of clubs or organizations: Not on file    Relationship status: Not on file  . Intimate partner violence:    Fear of current or ex partner: Not on file    Emotionally abused: Not on file    Physically abused: Not on file    Forced sexual activity: Not on file  Other Topics Concern  . Not on file  Social History Narrative   Fun: Garden, work in the yard, anything physical and walk daily    Family History:    Family History  Problem Relation Age of Onset  . Heart disease Mother   . Heart attack Mother   . Heart disease Father   . Heart attack Father   . Heart attack Brother   . Heart attack Sister   . Stroke Brother      ROS:  Please see the history of present illness.   All other ROS reviewed  and negative.     Physical Exam/Data:   Vitals:   06/20/18 1445 06/20/18 1502 06/20/18 1504 06/20/18 1719  BP:   (!) 93/58 91/69  Pulse: 98 96 91   Resp: 16 18 19    Temp:   98.3 F (36.8 C)   TempSrc:   Oral   SpO2: 96% 98% 99%   Weight:      Height:        Intake/Output Summary (Last 24 hours) at 06/20/2018 1840 Last data filed at 06/20/2018 1347 Gross per 24 hour  Intake 3700 ml  Output 130 ml  Net 3570 ml   Filed Weights   06/20/18 0850  Weight: 81.6 kg   Body mass index is 25.83 kg/m.  General:  Well nourished, well developed, in no acute distress HEENT: normal Lymph: no adenopathy Neck: no JVD Endocrine:  No thryomegaly Vascular: No carotid bruits; FA pulses 2+ bilaterally without bruits  Cardiac:  normal S1, S2; irregular; 3/6 apex murmur  Lungs:  clear to auscultation bilaterally, no wheezing, rhonchi or rales  Abd: soft, nontender, no hepatomegaly  Ext: no edema Musculoskeletal:  No deformities, BUE and BLE strength normal and equal Skin: warm and dry  Neuro:  CNs 2-12 intact, no focal abnormalities noted Psych:  Normal affect   EKG:  The EKG was personally reviewed and demonstrates: Atrial fibrillation with RVR Telemetry:  Telemetry was personally reviewed and demonstrates: Atrial fibrillation  Relevant CV Studies:  Cath 08/01/2017 1. Calcified coronary arteries with mild nonobstructive CAD 2. Moderate pulmonary HTN with mPAP 37 mmHg 3. Large V waves 41 mmHg consistent with severe mitral  regurgitation  Recommend: continue evaluation by the multidisciplinary heart valve team   Echo 10/24/2017 LV EF: 55% -   60% Study Conclusions  - Left ventricle: The cavity size was normal. There was mild   concentric hypertrophy. Systolic function was normal. The   estimated ejection fraction was in the range of 55% to 60%. Wall   motion was normal; there were no regional wall motion   abnormalities. - Aortic valve: A TAVR Edwards Sapien 3 THV (size 26 mm, model  #   [id number], serial # [id number] was present. There was   trivial perivalvular regurgitation. - Mitral valve: Moderately calcified annulus. Mildly thickened,   moderately calcified leaflets . Moderate prolapse, involving the   posterior leaflet, partially flail. The findings are consistent   with moderate stenosis. There was severe regurgitation directed   eccentrically and posteriorly. Mean gradient (D): 9 mm Hg. - Left atrium: The atrium was severely dilated. - Right atrium: The atrium was severely dilated. - Tricuspid valve: There was mild regurgitation. - Pulmonary arteries: Systolic pressure was mildly increased. PA   peak pressure: 42 mm Hg (S).  Impressions:  - No significant change in mitral regurgitation.   Laboratory Data:  Chemistry Recent Labs  Lab 06/20/18 0856  NA 139  K 3.9  CL 103  CO2 23  GLUCOSE 103*  BUN 14  CREATININE 1.02  CALCIUM 8.8*  GFRNONAA >60  GFRAA >60  ANIONGAP 13    Recent Labs  Lab 06/20/18 0856  PROT 6.5  ALBUMIN 3.8  AST 29  ALT 12  ALKPHOS 40  BILITOT 1.1   Hematology Recent Labs  Lab 06/20/18 0856  WBC 5.0  RBC 4.49  HGB 13.0  HCT 41.4  MCV 92.2  MCH 29.0  MCHC 31.4  RDW 15.8*  PLT 127*   Cardiac EnzymesNo results for input(s): TROPONINI in the last 168 hours. No results for input(s): TROPIPOC in the last 168 hours.  BNPNo results for input(s): BNP, PROBNP in the last 168 hours.  DDimer No results for input(s): DDIMER in the last 168 hours.  Radiology/Studies:  Dg Elbow 2 Views Left  Result Date: 06/20/2018 CLINICAL DATA:  Status post fall, altered mental status EXAM: LEFT ELBOW - 2 VIEW COMPARISON:  None. FINDINGS: Subtle lucency along the articular surface of the left radial head which may be projectional versus a subtle nondisplaced fracture. No other fracture or dislocation. Minimal osteoarthritis of the ulnohumeral joint. Soft tissues are unremarkable. IMPRESSION: Subtle lucency along the  articular surface of the left radial head which may be projectional versus a subtle nondisplaced fracture. Recommend oblique views of the elbow for further evaluation. Electronically Signed   By: Kathreen Devoid   On: 06/20/2018 09:48   Dg Elbow Complete Left (3+view)  Result Date: 06/20/2018 CLINICAL DATA:  Fall with left elbow pain. EXAM: LEFT ELBOW - COMPLETE 3+ VIEW COMPARISON:  Elbow radiographs obtained earlier today at 9:29 a.m. FINDINGS: No fracture. Specifically, there is no evidence of a radial head fracture. No bone lesion. Elbow joint is normally spaced and aligned. No joint effusion. Soft tissues are unremarkable. IMPRESSION: Negative. Electronically Signed   By: Lajean Manes M.D.   On: 06/20/2018 18:25   Ct Head Wo Contrast  Result Date: 06/20/2018 CLINICAL DATA:  Head trauma. EXAM: CT HEAD WITHOUT CONTRAST TECHNIQUE: Contiguous axial images were obtained from the base of the skull through the vertex without intravenous contrast. COMPARISON:  CT scan of January 21, 2018. FINDINGS: Brain: Mild diffuse cortical  atrophy is noted. Mild chronic ischemic white matter disease is noted. No mass effect or midline shift is noted. Ventricular size is within normal limits. There is no evidence of mass lesion, hemorrhage or acute infarction. Vascular: No hyperdense vessel or unexpected calcification. Skull: Normal. Negative for fracture or focal lesion. Sinuses/Orbits: No acute finding. Other: None. IMPRESSION: Mild diffuse cortical atrophy. Mild chronic ischemic white matter disease. No acute intracranial abnormality seen. Electronically Signed   By: Marijo Conception, M.D.   On: 06/20/2018 11:34   Ct Abdomen Pelvis W Contrast  Result Date: 06/20/2018 CLINICAL DATA:  Generalized abdominal pain with fever EXAM: CT ABDOMEN AND PELVIS WITH CONTRAST TECHNIQUE: Multidetector CT imaging of the abdomen and pelvis was performed using the standard protocol following bolus administration of intravenous contrast.  CONTRAST:  180mL OMNIPAQUE IOHEXOL 300 MG/ML  SOLN COMPARISON:  06/21/2017 FINDINGS: Lower chest: Cardiomegaly.  Dependent atelectasis.  No effusions. Hepatobiliary: No focal hepatic abnormality. Gallbladder unremarkable. Pancreas: No focal abnormality or ductal dilatation. Spleen: No focal abnormality.  Normal size. Adrenals/Urinary Tract: No hydronephrosis. Small cyst in the midpole of the left kidney. Adrenal glands unremarkable. Left posterior bladder wall diverticulum measures 4 cm, stable. Stomach/Bowel: Appendix remains borderline dilated at 8 mm. There is a small fluid collection adjacent to the tip of the appendix in the area of prior phlegmonous change, measuring maximally 1.3 cm. Mild surrounding inflammation in the right lower quadrant. No evidence of bowel obstruction. Few scattered sigmoid diverticula. Vascular/Lymphatic: Aortic atherosclerosis. No enlarged abdominal or pelvic lymph nodes. Reproductive: Mildly prominent prostate with central calcifications. Other: No free fluid or free air. Musculoskeletal: No acute bony abnormality. IMPRESSION: Continued mild inflammation in the right lower quadrant adjacent to the appendix which is mildly dilated at 8 mm compared with 11 mm previously. Very small focal fluid collection adjacent to the tip of the appendix measures maximally 1.3 cm. This is too small to drain. Aortic atherosclerosis. Prostate prominence with left posterior bladder wall diverticulum, stable. Electronically Signed   By: Rolm Baptise M.D.   On: 06/20/2018 11:34   Dg Chest Port 1 View  Result Date: 06/20/2018 CLINICAL DATA:  Pain following fall EXAM: PORTABLE CHEST 1 VIEW COMPARISON:  April 23, 2018 FINDINGS: There is no edema or consolidation. Heart is slightly enlarged, stable, with pulmonary vascularity normal. Patient is status post aortic valve replacement. There is aortic atherosclerosis. No adenopathy evident. There is degenerative change in each shoulder. IMPRESSION: No edema  or consolidation. Mild cardiomegaly, stable. Status post aortic valve replacement. There is aortic atherosclerosis. Aortic Atherosclerosis (ICD10-I70.0). Electronically Signed   By: Lowella Grip III M.D.   On: 06/20/2018 09:46    Assessment and Plan:   1. Preop clearance: Patient had minimal coronary artery disease noted previously on cardiac catheterization February 2019.  He has underwent TAVR in March 2019.  Main risk factor that increase his risk is age and severe MR.  He appears to be euvolemic on physical exam today.  I think he is a moderate risk patient for a high risk surgery, however no further cardiac work-up is expected prior to the surgery.  Will defer to general surgery service to decide on the timing of the surgery versus observation.  2. Fever and altered mental status: Unclear if related to appendicitis versus recent productive cough.  Chest x-ray was negative for acute process.  CT scan shows the size of the appendix actually decreased compared to November.  He received antibiotic  3. CAD: Moderate nonobstructive disease noted  on previous cardiac catheterization in February 2019  4. Chronic atrial fibrillation on Coumadin: Rate controlled on digoxin.  Restart metoprolol once blood pressure able to tolerate, current blood pressure borderline low.  Coumadin has been held.  5. Severe AS s/p TAVR: Stable on last echocardiogram in May 2019  6. Severe MR      For questions or updates, please contact [place] Please consult [email or web address] for contact info under     Signed, Almyra Deforest, [place]  06/20/2018 6:40 PM   I have seen and examined the patient along with Almyra Deforest, PA .  I have reviewed the chart, notes and new data.  I agree with PA/NP's note.  Key new complaints: no dyspnea or angina Key examination changes: normal JVP, no edema, holosystolic apical murmur, no gallop Key new findings / data: most recent echo, ECG, labs CT/Xray studies reviewed  PLAN: Surgical  risk is increased more due to his age and the possibly urgent nature of abdominal surgery, more than his cardiac problems, which are well compensated. Overall, surgical complication risk is moderate, not prohibitive. No plan for additional CV workup at this time. Please make sure that metoprolol is not abruptly discontinued in the periop period. It should be restarted as soon as his BP allows. Please let Cardiology know if/when he goes to surgery so that we can monitor for any CV events. Resume anticoagulation as soon as there is no plan for surgery or when bleeding is no longer an issue. "Bridge" with heparin IV for INR<2.0 if the decision whether to perform surgery will be delayed beyond 24 hours.   Sanda Klein, MD, [place] [phone] 06/20/2018, 8:21 PM

## 2018-06-21 DIAGNOSIS — E872 Acidosis: Secondary | ICD-10-CM | POA: Diagnosis present

## 2018-06-21 DIAGNOSIS — I110 Hypertensive heart disease with heart failure: Secondary | ICD-10-CM | POA: Diagnosis present

## 2018-06-21 DIAGNOSIS — I4821 Permanent atrial fibrillation: Secondary | ICD-10-CM | POA: Diagnosis present

## 2018-06-21 DIAGNOSIS — I2510 Atherosclerotic heart disease of native coronary artery without angina pectoris: Secondary | ICD-10-CM | POA: Diagnosis present

## 2018-06-21 DIAGNOSIS — E538 Deficiency of other specified B group vitamins: Secondary | ICD-10-CM | POA: Diagnosis present

## 2018-06-21 DIAGNOSIS — N401 Enlarged prostate with lower urinary tract symptoms: Secondary | ICD-10-CM | POA: Diagnosis present

## 2018-06-21 DIAGNOSIS — I10 Essential (primary) hypertension: Secondary | ICD-10-CM | POA: Diagnosis not present

## 2018-06-21 DIAGNOSIS — R0902 Hypoxemia: Secondary | ICD-10-CM | POA: Diagnosis not present

## 2018-06-21 DIAGNOSIS — I5033 Acute on chronic diastolic (congestive) heart failure: Secondary | ICD-10-CM | POA: Diagnosis present

## 2018-06-21 DIAGNOSIS — K3580 Unspecified acute appendicitis: Secondary | ICD-10-CM | POA: Diagnosis not present

## 2018-06-21 DIAGNOSIS — K3532 Acute appendicitis with perforation and localized peritonitis, without abscess: Secondary | ICD-10-CM | POA: Diagnosis present

## 2018-06-21 DIAGNOSIS — Z9842 Cataract extraction status, left eye: Secondary | ICD-10-CM | POA: Diagnosis not present

## 2018-06-21 DIAGNOSIS — R269 Unspecified abnormalities of gait and mobility: Secondary | ICD-10-CM | POA: Diagnosis not present

## 2018-06-21 DIAGNOSIS — N138 Other obstructive and reflux uropathy: Secondary | ICD-10-CM | POA: Diagnosis present

## 2018-06-21 DIAGNOSIS — Z7901 Long term (current) use of anticoagulants: Secondary | ICD-10-CM | POA: Diagnosis not present

## 2018-06-21 DIAGNOSIS — I482 Chronic atrial fibrillation, unspecified: Secondary | ICD-10-CM | POA: Diagnosis not present

## 2018-06-21 DIAGNOSIS — I252 Old myocardial infarction: Secondary | ICD-10-CM | POA: Diagnosis not present

## 2018-06-21 DIAGNOSIS — E559 Vitamin D deficiency, unspecified: Secondary | ICD-10-CM | POA: Diagnosis present

## 2018-06-21 DIAGNOSIS — A419 Sepsis, unspecified organism: Secondary | ICD-10-CM | POA: Diagnosis present

## 2018-06-21 DIAGNOSIS — G4700 Insomnia, unspecified: Secondary | ICD-10-CM | POA: Diagnosis present

## 2018-06-21 DIAGNOSIS — Z8673 Personal history of transient ischemic attack (TIA), and cerebral infarction without residual deficits: Secondary | ICD-10-CM | POA: Diagnosis not present

## 2018-06-21 DIAGNOSIS — Z961 Presence of intraocular lens: Secondary | ICD-10-CM | POA: Diagnosis present

## 2018-06-21 DIAGNOSIS — C181 Malignant neoplasm of appendix: Secondary | ICD-10-CM | POA: Diagnosis present

## 2018-06-21 DIAGNOSIS — I447 Left bundle-branch block, unspecified: Secondary | ICD-10-CM | POA: Diagnosis present

## 2018-06-21 DIAGNOSIS — M1990 Unspecified osteoarthritis, unspecified site: Secondary | ICD-10-CM | POA: Diagnosis present

## 2018-06-21 DIAGNOSIS — M5416 Radiculopathy, lumbar region: Secondary | ICD-10-CM | POA: Diagnosis present

## 2018-06-21 DIAGNOSIS — Z953 Presence of xenogenic heart valve: Secondary | ICD-10-CM | POA: Diagnosis not present

## 2018-06-21 DIAGNOSIS — Z9861 Coronary angioplasty status: Secondary | ICD-10-CM | POA: Diagnosis not present

## 2018-06-21 DIAGNOSIS — R1031 Right lower quadrant pain: Secondary | ICD-10-CM | POA: Diagnosis present

## 2018-06-21 DIAGNOSIS — Z8249 Family history of ischemic heart disease and other diseases of the circulatory system: Secondary | ICD-10-CM | POA: Diagnosis not present

## 2018-06-21 LAB — BASIC METABOLIC PANEL
Anion gap: 6 (ref 5–15)
BUN: 11 mg/dL (ref 8–23)
CO2: 24 mmol/L (ref 22–32)
Calcium: 7.7 mg/dL — ABNORMAL LOW (ref 8.9–10.3)
Chloride: 111 mmol/L (ref 98–111)
Creatinine, Ser: 0.99 mg/dL (ref 0.61–1.24)
GFR calc Af Amer: >60 mL/min (ref >60)
GFR calc non Af Amer: >60 mL/min (ref >60)
Glucose, Bld: 94 mg/dL (ref 70–99)
Potassium: 3.4 mmol/L — ABNORMAL LOW (ref 3.5–5.1)
Sodium: 141 mmol/L (ref 135–145)

## 2018-06-21 LAB — CBC
HEMATOCRIT: 32.5 % — AB (ref 39.0–52.0)
Hemoglobin: 10.4 g/dL — ABNORMAL LOW (ref 13.0–17.0)
MCH: 29.6 pg (ref 26.0–34.0)
MCHC: 32 g/dL (ref 30.0–36.0)
MCV: 92.6 fL (ref 80.0–100.0)
Platelets: 103 10*3/uL — ABNORMAL LOW (ref 150–400)
RBC: 3.51 MIL/uL — ABNORMAL LOW (ref 4.22–5.81)
RDW: 16.3 % — ABNORMAL HIGH (ref 11.5–15.5)
WBC: 7.8 10*3/uL (ref 4.0–10.5)
nRBC: 0 % (ref 0.0–0.2)

## 2018-06-21 LAB — VANCOMYCIN, RANDOM: Vancomycin Rm: 14

## 2018-06-21 LAB — VANCOMYCIN, PEAK: Vancomycin Pk: 19 ug/mL — ABNORMAL LOW (ref 30–40)

## 2018-06-21 LAB — PROTIME-INR
INR: 2.31
Prothrombin Time: 25 seconds — ABNORMAL HIGH (ref 11.4–15.2)

## 2018-06-21 LAB — MRSA PCR SCREENING: MRSA by PCR: NEGATIVE

## 2018-06-21 MED ORDER — VANCOMYCIN HCL 10 G IV SOLR
1250.0000 mg | INTRAVENOUS | Status: DC
  Administered 2018-06-21: 1250 mg via INTRAVENOUS
  Filled 2018-06-21: qty 1250

## 2018-06-21 MED ORDER — VANCOMYCIN HCL 10 G IV SOLR
1500.0000 mg | INTRAVENOUS | Status: DC
  Administered 2018-06-22 – 2018-06-23 (×2): 1500 mg via INTRAVENOUS
  Filled 2018-06-21 (×3): qty 1500

## 2018-06-21 MED ORDER — PHYTONADIONE 5 MG PO TABS
10.0000 mg | ORAL_TABLET | Freq: Once | ORAL | Status: AC
  Administered 2018-06-21: 10 mg via ORAL
  Filled 2018-06-21: qty 2

## 2018-06-21 NOTE — Progress Notes (Addendum)
CC:  RLQ abdominal pain, history of appendicitis  Subjective: He continues to complain of pain on the right, more in the RLQ than the RUQ.  He still has fever despite the normal WBC.  I check him on this exam he feels febrile and temp 100.4 rectal.    Objective: Vital signs in last 24 hours: Temp:  [97.7 F (36.5 C)-103 F (39.4 C)] 98.3 F (36.8 C) (01/03 0730) Pulse Rate:  [81-120] 81 (01/03 0730) Resp:  [16-27] 19 (01/02 1504) BP: (91-126)/(46-70) 103/54 (01/03 0730) SpO2:  [93 %-99 %] 96 % (01/03 0730) Weight:  [81 kg-81.6 kg] 81 kg (01/03 0621) Last BM Date: 06/20/18 5172 IV 230 urine recorded Temp 103 on admit;  Afebrile till 1900 and temp up to 101, then afebrile after that spike.  BP 113/63 - 91/50 Sats good on 3 liter [place] BMP OK K+ 3.4 WBC 7.8 H/H 10.4/32.5 Platelets 103K INR 2.31  Intake/Output from previous day: 01/02 0701 - 01/03 0700 In: 5172.2 [I.V.:1684.6; IV Piggyback:3487.5] Out: 230 [Urine:230] Intake/Output this shift: No intake/output data recorded.  General appearance: alert, cooperative, no distress and still having pain on right side GI: soft, tender on right side, he says RLQ worse than left, but It was also fairly tender RUQ also.   Lab Results:  Recent Labs    06/20/18 0856 06/21/18 0308  WBC 5.0 7.8  HGB 13.0 10.4*  HCT 41.4 32.5*  PLT 127* 103*    BMET Recent Labs    06/20/18 0856 06/21/18 0308  NA 139 141  K 3.9 3.4*  CL 103 111  CO2 23 24  GLUCOSE 103* 94  BUN 14 11  CREATININE 1.02 0.99  CALCIUM 8.8* 7.7*   PT/INR Recent Labs    06/20/18 0856 06/21/18 0308  LABPROT 23.6* 25.0*  INR 2.14 2.31    Recent Labs  Lab 06/20/18 0856  AST 29  ALT 12  ALKPHOS 40  BILITOT 1.1  PROT 6.5  ALBUMIN 3.8     Lipase     Component Value Date/Time   LIPASE 30 06/20/2018 0856     Medications: . digoxin  125 mcg Oral Daily  . finasteride  5 mg Oral QHS  . Melatonin  9 mg Oral QHS    Assessment/Plan  S/p  TAVR CAD A fib on coumadin HTN H/o MI H/O CVA SIRS, unclear etiology Thrombocytopenia - 103K  Acute appendicitis The patient had a fever of 103 on arrival which is unlikely to be secondary to an intra-abdominal source.  His appendix still shows some inflammatory changes, but not as significant as what was seen in November.  It is unlikely his fever is related to his appendix; however, that doesn't mean he may not have a bacteremia, etc secondary to smoldering appendicitis.  He did have a valve replacement in February and this could be evaluated as well to rule out endocarditis, but will defer to medicine.  As of right now, he does not need an emergent operation, but he may still require an operation this admission for his appendix if felt to be the source of what is making him ill.  Agree with antibiotic therapy and fluid resuscitation at this point and await further labs.  Hold coumadin, may bridge as needed.  Recommend cards consult for perioperative evaluation in case he needs surgery this admission.  FEN - NPO x ice VTE - hold coumadin,   - ok for chemical prophylaxis from our standpoint  -  (INR  2.31 today) ID: Vancomycin x 1 06/20/17,Cefepime 1/2 >> day 2, flagyl 1.2 >> day 2  Plan:  Continue antibiotics/IV fluids.  I'm not sure UO is correct, creatinine is stable.  Follow labs and exam tomorrow.      LOS: 1 day    Lorin Hauck 06/21/2018 [phone]

## 2018-06-21 NOTE — Progress Notes (Signed)
Called cardiologist on call, Dr. Percival Spanish to discuss patient's anticoagulation as he is likely going to surgery tomorrow pending INR.  He stated that as long as INR is not therapeutic, that the patient should not be on IV heparin.  Will hold heparin given INR is 2.31 today.  If INR <2, will start heparin.  [place] Constable, D.O.  PGY-1 Family Medicine  06/21/2018 4:58 PM

## 2018-06-21 NOTE — Progress Notes (Signed)
Family Medicine Teaching Service Daily Progress Note Intern Pager: [phone]  Patient name: Wayne [name] Medical record number: [id number] Date of birth: [birth year] Age: 85 y.o. Gender: male  Primary Care Provider: [name], DO Consultants: Cardiology, General Surgery Code Status: DNR  Pt Overview and Major Events to Date:  1/2 Admitted to FPTS  Assessment and Plan: Wayne [name] is a 85 y.o. male presenting with weakness and fall. PMH is significant for A. fib on chronic anticoagulation, aortic valve replacement with porcine valve, CAD, CVD, BPH, MI s/p PCI, CVA, HFpEF, and hypertension.  SIRS: Unclear source, likely appendicitis  Temp overnight to 101 at 2135.  Patient's BP remains soft overnight with BP 91/50-60. General surgery to see today and decide if proceeding with surgery.   Has received total 3L of fluid yesterday in boluses and maintenance IV fluids.  Blood and urine cx.  This AM patient states that he is feeling better than he was last PM.   As source is still unclear, can consider echo following surgery should patient continue to fever, as endocarditis cannot be ruled out. -Cardiac monitoring -Continuous pulse ox -Fall precautions, up with assistance -Continue Vanc, cefepime, Flagyl (1/2- ) -Follow-up general surgery recommendations -N.p.o. with ice chips pending surgery -f/u blood cx and urine cx, in process -NS@ maintenance IV fluids, 120 cc/h -Holding ASA, warfarin pending surgery -cardiology consulted for surgical clearance, moderate risk, will advise cardiology if patient goes to surgery, recommend bridge with heparin IV for INR <2 if >24hrs without surgery decision - restart metoprolol as soon as BP allows  Appendicits Gen Surg consulted and unclear of plan at this time.  Pain this AM well-controlled, abdominal exam remains TTP. - Plan per above - possible surgery today  Elbow Injury S/P Fall Elbow pain following fall prior to  presentation.  Initial x-ray possible fracture.  Oblique films negative for fracture. - no intervention needed at this time - RICE therapy   Atrial Fibrillation with RVR, now resolved Chronically anticoagulated on Coumadin.  On metoprolol 25 mg every morning, 12.5 mg nightly and digoxin 125 mcg QD.  Follows with Dr. Stanford Breed.  Most recent Echo 10/2017, EF 55-60%, severely dilated L&R atrium, severe mitral regurg.  HR in 80s overnight.  Cardiology consult recommends starting metoprolol as soon as BP allows. - hold metoprolol for BP - cont home digoxin - holding anticoagulation - cardiac monitoring - cards consulted, appreciate recs  HFpEF Last Echo per above.  On Lasix 40mg  QD chronically. CXR negative.  Appears euvolemic on exam.  - hold home Lasix - fluids per above - daily weights - consider echo after surgery   TAVR March 2019 On coumadin, INR goal 2-3. On Lasix 40mg  QD at home.  Last Echo per above. - holding anticoagulation pending surgery   Hx CVA with balance deficit, chronic, stable Cerebellar stroke 2007.  On ASA and coumadin.  CT head negative for acute abnormality.  Functioning well and even drove a car this week.  Wife notes patient has been using a walker recently. - holding ASA and coumadin pending surgery - up with assistance, fall precaution - f/u PT/OT  Chronic Constipation On colace and miralax at home prn. - holding at present - will restart when can have diet and BP stable  BPH On alfuzosin and finasteride at home.   - cont home finasteride - hold alfuzosin while soft BPs  FEN/GI: NPO with ice chips pending surgery PPx: SCDs pending surgery  Disposition: pending clinical improvement  Subjective:  Patient states that he did not have a good night last PM, but that he is feeling better this AM.  Continues to have abdominal pain.  No other complaints.  Objective: Temp:  [97.7 F (36.5 C)-103 F (39.4 C)] 98.9 F (37.2 C) (01/03 0200) Pulse Rate:   [81-120] 81 (01/02 2316) Resp:  [16-27] 19 (01/02 1504) BP: (91-126)/(46-70) 91/50 (01/02 2316) SpO2:  [93 %-99 %] 96 % (01/02 2316) Weight:  [81 kg-81.6 kg] 81 kg (01/03 4970)  Physical Exam: General: 85 y.o. male in NAD Cardio: irregularly irregular Lungs: CTAB, no wheezing, no rhonchi, no crackles Abdomen: Soft, TTP lower abdomen, positive bowel sounds Skin: warm and dry Extremities: No edema   Laboratory: Recent Labs  Lab 06/20/18 0856 06/21/18 0308  WBC 5.0 7.8  HGB 13.0 10.4*  HCT 41.4 32.5*  PLT 127* 103*   Recent Labs  Lab 06/20/18 0856 06/21/18 0308  NA 139 141  K 3.9 3.4*  CL 103 111  CO2 23 24  BUN 14 11  CREATININE 1.02 0.99  CALCIUM 8.8* 7.7*  PROT 6.5  --   BILITOT 1.1  --   ALKPHOS 40  --   ALT 12  --   AST 29  --   GLUCOSE 103* 94    Imaging/Diagnostic Tests: Dg Elbow 2 Views Left  Result Date: 06/20/2018 CLINICAL DATA:  Status post fall, altered mental status EXAM: LEFT ELBOW - 2 VIEW COMPARISON:  None. FINDINGS: Subtle lucency along the articular surface of the left radial head which may be projectional versus a subtle nondisplaced fracture. No other fracture or dislocation. Minimal osteoarthritis of the ulnohumeral joint. Soft tissues are unremarkable. IMPRESSION: Subtle lucency along the articular surface of the left radial head which may be projectional versus a subtle nondisplaced fracture. Recommend oblique views of the elbow for further evaluation. Electronically Signed   By: Kathreen Devoid   On: 06/20/2018 09:48   Dg Elbow Complete Left (3+view)  Result Date: 06/20/2018 CLINICAL DATA:  Fall with left elbow pain. EXAM: LEFT ELBOW - COMPLETE 3+ VIEW COMPARISON:  Elbow radiographs obtained earlier today at 9:29 a.m. FINDINGS: No fracture. Specifically, there is no evidence of a radial head fracture. No bone lesion. Elbow joint is normally spaced and aligned. No joint effusion. Soft tissues are unremarkable. IMPRESSION: Negative. Electronically  Signed   By: Lajean Manes M.D.   On: 06/20/2018 18:25   Ct Head Wo Contrast  Result Date: 06/20/2018 CLINICAL DATA:  Head trauma. EXAM: CT HEAD WITHOUT CONTRAST TECHNIQUE: Contiguous axial images were obtained from the base of the skull through the vertex without intravenous contrast. COMPARISON:  CT scan of January 21, 2018. FINDINGS: Brain: Mild diffuse cortical atrophy is noted. Mild chronic ischemic white matter disease is noted. No mass effect or midline shift is noted. Ventricular size is within normal limits. There is no evidence of mass lesion, hemorrhage or acute infarction. Vascular: No hyperdense vessel or unexpected calcification. Skull: Normal. Negative for fracture or focal lesion. Sinuses/Orbits: No acute finding. Other: None. IMPRESSION: Mild diffuse cortical atrophy. Mild chronic ischemic white matter disease. No acute intracranial abnormality seen. Electronically Signed   By: Marijo Conception, M.D.   On: 06/20/2018 11:34   Ct Abdomen Pelvis W Contrast  Result Date: 06/20/2018 CLINICAL DATA:  Generalized abdominal pain with fever EXAM: CT ABDOMEN AND PELVIS WITH CONTRAST TECHNIQUE: Multidetector CT imaging of the abdomen and pelvis was performed using the standard protocol following bolus administration of intravenous contrast. CONTRAST:  125mL OMNIPAQUE IOHEXOL 300 MG/ML  SOLN COMPARISON:  06/21/2017 FINDINGS: Lower chest: Cardiomegaly.  Dependent atelectasis.  No effusions. Hepatobiliary: No focal hepatic abnormality. Gallbladder unremarkable. Pancreas: No focal abnormality or ductal dilatation. Spleen: No focal abnormality.  Normal size. Adrenals/Urinary Tract: No hydronephrosis. Small cyst in the midpole of the left kidney. Adrenal glands unremarkable. Left posterior bladder wall diverticulum measures 4 cm, stable. Stomach/Bowel: Appendix remains borderline dilated at 8 mm. There is a small fluid collection adjacent to the tip of the appendix in the area of prior phlegmonous change,  measuring maximally 1.3 cm. Mild surrounding inflammation in the right lower quadrant. No evidence of bowel obstruction. Few scattered sigmoid diverticula. Vascular/Lymphatic: Aortic atherosclerosis. No enlarged abdominal or pelvic lymph nodes. Reproductive: Mildly prominent prostate with central calcifications. Other: No free fluid or free air. Musculoskeletal: No acute bony abnormality. IMPRESSION: Continued mild inflammation in the right lower quadrant adjacent to the appendix which is mildly dilated at 8 mm compared with 11 mm previously. Very small focal fluid collection adjacent to the tip of the appendix measures maximally 1.3 cm. This is too small to drain. Aortic atherosclerosis. Prostate prominence with left posterior bladder wall diverticulum, stable. Electronically Signed   By: Rolm Baptise M.D.   On: 06/20/2018 11:34   Dg Chest Port 1 View  Result Date: 06/20/2018 CLINICAL DATA:  Pain following fall EXAM: PORTABLE CHEST 1 VIEW COMPARISON:  April 23, 2018 FINDINGS: There is no edema or consolidation. Heart is slightly enlarged, stable, with pulmonary vascularity normal. Patient is status post aortic valve replacement. There is aortic atherosclerosis. No adenopathy evident. There is degenerative change in each shoulder. IMPRESSION: No edema or consolidation. Mild cardiomegaly, stable. Status post aortic valve replacement. There is aortic atherosclerosis. Aortic Atherosclerosis (ICD10-I70.0). Electronically Signed   By: Lowella Grip III M.D.   On: 06/20/2018 09:46    Keefe Zawistowski, Bernita Raisin, DO 06/21/2018, 6:59 AM PGY-1, [place] Intern pager: [phone], text pages welcome

## 2018-06-21 NOTE — Anesthesia Preprocedure Evaluation (Addendum)
Anesthesia Evaluation  Patient identified by MRN, date of birth, ID band Patient awake    Reviewed: Allergy & Precautions, NPO status , Patient's Chart, lab work & pertinent test results  History of Anesthesia Complications Negative for: history of anesthetic complications  Airway Mallampati: II  TM Distance: >3 FB Neck ROM: Full    Dental  (+) Dental Advisory Given, Chipped   Pulmonary shortness of breath and at rest,    breath sounds clear to auscultation       Cardiovascular hypertension, Pt. on medications and Pt. on home beta blockers (-) angina+ CAD, + Past MI and +CHF  + dysrhythmias Atrial Fibrillation + Valvular Problems/Murmurs (now s/p TAVR) AS and MR  Rhythm:Irregular Rate:Normal  5/19 ECHO: EF 55-60%, s/p TAVR, Mitral valve with Moderate prolapse, involving the posterior leaflet, partially flail. Severe regurgitation, mod MS with Mean gradient (D): 9 mm Hg.  2/19 Cath: mild nonobstructive coronary disease, moderate pulmonary hypertension and large V waves consistent with severe mitral regurgitation.   Neuro/Psych Anxiety CVA (following balloon angioplasty)    GI/Hepatic Neg liver ROS, Acute/chronic appendicitis   Endo/Other  negative endocrine ROS  Renal/GU negative Renal ROS     Musculoskeletal  (+) Arthritis ,   Abdominal   Peds  Hematology  (+) Blood dyscrasia (Hb 10.8, plt 100k), anemia , Coumadin: INR 1.74   Anesthesia Other Findings   Reproductive/Obstetrics                          Anesthesia Physical Anesthesia Plan  ASA: IV  Anesthesia Plan: General   Post-op Pain Management:    Induction: Intravenous  PONV Risk Score and Plan: 2 and Ondansetron, Dexamethasone and Treatment may vary due to age or medical condition  Airway Management Planned: Oral ETT  Additional Equipment:   Intra-op Plan:   Post-operative Plan: Possible Post-op  intubation/ventilation  Informed Consent: I have reviewed the patients History and Physical, chart, labs and discussed the procedure including the risks, benefits and alternatives for the proposed anesthesia with the patient or authorized representative who has indicated his/her understanding and acceptance.   Dental advisory given  Plan Discussed with: Surgeon and CRNA  Anesthesia Plan Comments: (Plan routine monitors, GETA with possible post op ventilation Pt, wife, daughter understand and agree to full resuscitation perioperatively, despite DNR status)       Anesthesia Quick Evaluation

## 2018-06-21 NOTE — Progress Notes (Signed)
Pharmacy Antibiotic Note  Nilan Iddings is a 85 y.o. male admitted on 06/20/2018 with sepsis. Pharmacy has been consulted for vancomycin and cefepime dosing. Tmax 103>>AF, WBC WNL, LA 4.45>1.69, SCr 1 (BL ~0.8)  Goal AUC 400-550. Vancomycin 1250 mg was given at 1037 today (second dose since admission), Peak level = 19, drawn at 1422, second level = 14 drawn at 1941, calculated Ke = 0.0574, t1/2 = 12 hrs, AUC = 392 slightly subtherapeutic.    Plan: Increase Vancomycin to 1500mg  IV Q24H New estimated AUC = 471 F/u renal fxn, C&S  Height: 5\' 10"  (177.8 cm) Weight: 178 lb 9.2 oz (81 kg) IBW/kg (Calculated) : 73  Temp (24hrs), Avg:99 F (37.2 C), Min:97.8 F (36.6 C), Max:101 F (38.3 C)  Recent Labs  Lab 06/20/18 0856 06/20/18 0908 06/20/18 1152 06/21/18 0308 06/21/18 1422 06/21/18 1941  WBC 5.0  --   --  7.8  --   --   CREATININE 1.02  --   --  0.99  --   --   LATICACIDVEN  --  4.45* 1.69  --   --   --   VANCOPEAK  --   --   --   --  19*  --   VANCORANDOM  --   --   --   --   --  14    Estimated Creatinine Clearance: 55.3 mL/min (by C-G formula based on SCr of 0.99 mg/dL).    Allergies  Allergen Reactions  . Penicillins Rash and Other (See Comments)    PATIENT HAS HAD A PCN REACTION WITH IMMEDIATE RASH, FACIAL/TONGUE/THROAT SWELLING, SOB, OR LIGHTHEADEDNESS WITH HYPOTENSION:  #  #  #  YES  #  #  #   Has patient had a PCN reaction causing severe rash involving mucus membranes or skin necrosis: No Has patient had a PCN reaction that required hospitalization No Has patient had a PCN reaction occurring within the last 10 years: No If all of the above answers are "NO", then may proceed with Cephalosporin use.  . Prednisone Other (See Comments)    Wheezing  . Procaine Hcl Other (See Comments)    Passed out after being given this at dental appointment  . Levofloxacin Rash  . Oxycodone Other (See Comments)    constipation    Antimicrobials this admission: Vanc  1/2>> Cefepime 1/2>> Flagyl 1/2>>  Dose adjustments this admission: 1/3 Vancomycin 1250 mg was given at 1037 today (second dose since admission), Peak level = 19, drawn at 1422, second level = 14 drawn at 1941, calculated Ke = 0.0574, t1/2 = 12 hrs, AUC = 392 slightly subtherapeutic. --> increase vancomycin to 1500 mg IV Q 24 hrs   Microbiology results: 1/2 Blood x 2 - ngtd 1/2 urine -    Thank you for allowing pharmacy to be a part of this patient's care.  Maryanna Shape, PharmD, BCPS, [facility] Clinical Pharmacist  Pager: [phone]   06/21/2018 9:01 PM

## 2018-06-21 NOTE — Evaluation (Signed)
Occupational Therapy Evaluation Patient Details Name: Wayne [name] MRN: [id number] DOB: 03-Dec-1929 Today's Date: 06/21/2018    History of Present Illness Wayne [name] is a 85 y.o. male with a hx of CAD, chronic atrial fibrillation on coumadin, severe MR medically managed, severe AS s/p TAVR 08/2017, and pulmonary HTN.  Admit for Sepsis, presumed to be due to appendicitis.    Clinical Impression   Wayne with decline in function and safety with ADLs and ADL mobility with decreased strength, balance and endurance. Wayne [name] limited by pain/discomfort in R abdominal and back areas, but agreeable to OOB activity. Wayne's wife present. Wayne would benefit from acute OT services to address impairments to maximize level of function and safety to return home    Follow Up Recommendations  Home health OT    Equipment Recommendations  Other (comment)(reacher)    Recommendations for Other Services       Precautions / Restrictions Precautions Precautions: Fall Restrictions Weight Bearing Restrictions: No      Mobility Bed Mobility Overal bed mobility: Needs Assistance Bed Mobility: Supine to Sit           General bed mobility comments: assist to elevate trunk and increased time  Transfers Overall transfer level: Needs assistance Equipment used: Rolling walker (2 wheeled) Transfers: Sit to/from [facility] Sit to Stand: Min assist Stand pivot transfers: Min assist            Balance Overall balance assessment: Needs assistance Sitting-balance support: No upper extremity supported;Feet supported Sitting balance-Leahy Scale: Fair     Standing balance support: Bilateral upper extremity supported;During functional activity Standing balance-Leahy Scale: Poor                             ADL either performed or assessed with clinical judgement   ADL Overall ADL's : Needs assistance/impaired     Grooming: Wash/dry hands;Wash/dry  face;Sitting;Min guard   Upper Body Bathing: Min guard;Sitting   Lower Body Bathing: Moderate assistance   Upper Body Dressing : Min guard;Sitting   Lower Body Dressing: Moderate assistance   Toilet Transfer: Minimal assistance;Ambulation;RW;BSC Toilet Transfer Details (indicate cue type and reason): Wayne also sat EOB to use urinal Toileting- Clothing Manipulation and Hygiene: Minimal assistance       Functional mobility during ADLs: Rolling walker;Minimal assistance;Cueing for safety       Vision Baseline Vision/History: Wears glasses Patient Visual Report: No change from baseline       Perception     Praxis      Pertinent Vitals/Pain Pain Assessment: 0-10 Pain Score: 6  Pain Location: abdomen, back Pain Descriptors / Indicators: Aching;Grimacing;Guarding Pain Intervention(s): Limited activity within patient's tolerance;Monitored during session;Premedicated before session;Repositioned     Hand Dominance Right   Extremity/Trunk Assessment Upper Extremity Assessment Upper Extremity Assessment: Overall WFL for tasks assessed   Lower Extremity Assessment Lower Extremity Assessment: Defer to Wayne evaluation   Cervical / Trunk Assessment Cervical / Trunk Assessment: Normal   Communication Communication Communication: HOH   Cognition Arousal/Alertness: Awake/alert Behavior During Therapy: WFL for tasks assessed/performed Overall Cognitive Status: Within Functional Limits for tasks assessed                                     General Comments       Exercises     Shoulder Instructions  Home Living Family/patient expects to be discharged to:: Private residence Living Arrangements: Spouse/significant other Available Help at Discharge: Family;Available 24 hours/day Type of Home: House Home Access: Stairs to enter [employer] of Steps: 2 Entrance Stairs-Rails: Left;Right Home Layout: Multi-level;Bed/bath upstairs Alternate Level  Stairs-Number of Steps: has chair lift  Alternate Level Stairs-Rails: Left;Right Bathroom Shower/Tub: Occupational psychologist: Standard Bathroom Accessibility: Yes   Home Equipment: Environmental consultant - 2 wheels;Walker - 4 wheels;Tub bench;Bedside commode;Grab bars - toilet;Grab bars - tub/shower          Prior Functioning/Environment Level of Independence: Independent with assistive device(s);Needs assistance  Gait / Transfers Assistance Needed: used rollator in home and RW outside home ADL's / Homemaking Assistance Needed: ADls, feeds, toilets and grooms himself   Comments: Uses rollator for ambulation.         OT Problem List: Decreased activity tolerance;Pain;Impaired balance (sitting and/or standing);Decreased knowledge of use of DME or AE      OT Treatment/Interventions: Self-care/ADL training;Therapeutic exercise;DME and/or AE instruction;Therapeutic activities;Patient/family education    OT Goals(Current goals can be found in the care plan section) Acute Rehab OT Goals Patient Stated Goal: to go home OT Goal Formulation: With patient/family Time For Goal Achievement: 07/05/18 Potential to Achieve Goals: Good ADL Goals Wayne Will Perform Grooming: with supervision;with set-up;sitting;with caregiver independent in assisting Wayne Will Perform Upper Body Bathing: with supervision;with set-up;sitting;with caregiver independent in assisting Wayne Will Perform Lower Body Bathing: with min assist;sitting/lateral leans;sit to/from stand;with caregiver independent in assisting Wayne Will Perform Upper Body Dressing: with supervision;with set-up;with caregiver independent in assisting;sitting Wayne Will Perform Lower Body Dressing: with min assist;sitting/lateral leans;sit to/from stand;with caregiver independent in assisting Wayne Will Transfer to Toilet: with min guard assist;with supervision;ambulating;regular height toilet;bedside commode;grab bars Wayne Will Perform Toileting - Clothing  Manipulation and hygiene: with min guard assist;sit to/from stand;with caregiver independent in assisting Wayne Will Perform Tub/Shower Transfer: with min guard assist;with supervision;ambulating;rolling walker;shower seat;with caregiver independent in assisting;3 in 1;grab bars  OT Frequency: Min 2X/week   Barriers to D/C:    no barriers       Co-evaluation              AM-PAC OT "6 Clicks" Daily Activity     Outcome Measure Help from another person eating meals?: None Help from another person taking care of personal grooming?: A Little Help from another person toileting, which includes using toliet, bedpan, or urinal?: A Lot Help from another person bathing (including washing, rinsing, drying)?: A Lot Help from another person to put on and taking off regular upper body clothing?: A Little Help from another person to put on and taking off regular lower body clothing?: A Lot 6 Click Score: 16   End of Session Equipment Utilized During Treatment: Gait belt;Rolling walker;Other (comment)(BSC)  Activity Tolerance: Patient limited by fatigue;Patient limited by pain Patient left: in bed;with call bell/phone within reach;with family/visitor present  OT Visit Diagnosis: Unsteadiness on feet (R26.81);Muscle weakness (generalized) (M62.81);Pain Pain - Right/Left: Right Pain - part of body: (abdomen and back areas)                Time: 4627-0350 OT Time Calculation (min): 31 min Charges:  OT General Charges $OT Visit: 1 Visit OT Evaluation $OT Eval Moderate Complexity: 1 Mod OT Treatments $Therapeutic Activity: 8-22 mins    Britt Bottom 06/21/2018, 1:23 PM

## 2018-06-21 NOTE — Evaluation (Signed)
Physical Therapy Evaluation Patient Details Name: Wayne [name] MRN: [id number] DOB: 1929-08-04 Today's Date: 06/21/2018   History of Present Illness  Wayne [name] is a 85 y.o. male with a hx of CAD, chronic atrial fibrillation on coumadin, severe MR medically managed, severe AS s/p TAVR 08/2017, and pulmonary HTN.  Admit for Sepsis, presumed to be due to appendicitis.   Clinical Impression  Pt admitted with above diagnosis. Pt currently with functional limitations due to the deficits listed below (see PT Problem List). Pt was able to transfer to a 3N1 and use the bathroom.  NT came in to assist pt with bath.  Pt tolerated well.  Should progress well.  Will follow acutely.  Pt will benefit from skilled PT to increase their independence and safety with mobility to allow discharge to the venue listed below.      Follow Up Recommendations Home health PT;Supervision/Assistance - 24 hour    Equipment Recommendations  None recommended by PT    Recommendations for Other Services       Precautions / Restrictions Precautions Precautions: Fall Restrictions Weight Bearing Restrictions: No      Mobility  Bed Mobility Overal bed mobility: Independent             General bed mobility comments: incr time due to soreness  Transfers Overall transfer level: Needs assistance Equipment used: Rolling walker (2 wheeled) Transfers: Sit to/from [facility] Sit to Stand: Min guard Stand pivot transfers: Min guard       General transfer comment: Pt needed guard assist only to stand and pivot to 3N1. Pt had BM.  Total assist to clean pt.  Nsg came in and wanted me to leave pt on 3N1 so she could change bed and make the bed.  NT to move pt back to bed after.   Ambulation/Gait             General Gait Details: did not feel up to ambulation.  Stairs            Wheelchair Mobility    Modified Rankin (Stroke Patients Only)       Balance Overall  balance assessment: Needs assistance Sitting-balance support: No upper extremity supported;Feet supported Sitting balance-Leahy Scale: Fair     Standing balance support: Bilateral upper extremity supported;During functional activity Standing balance-Leahy Scale: Poor Standing balance comment: relies on UE support                             Pertinent Vitals/Pain Pain Assessment: Faces Faces Pain Scale: Hurts little more Pain Location: abdomen Pain Descriptors / Indicators: Aching;Grimacing;Guarding Pain Intervention(s): Limited activity within patient's tolerance;Monitored during session;Repositioned    Home Living Family/patient expects to be discharged to:: Private residence Living Arrangements: Spouse/significant other Available Help at Discharge: Family;Available 24 hours/day Type of Home: House Home Access: Stairs to enter Entrance Stairs-Rails: Chemical engineer of Steps: 2 Home Layout: Multi-level;Bed/bath upstairs Home Equipment: Environmental consultant - 2 wheels;Walker - 4 wheels;Tub bench;Bedside commode;Grab bars - toilet;Grab bars - tub/shower      Prior Function Level of Independence: Independent with assistive device(s);Needs assistance   Gait / Transfers Assistance Needed: used rollator in home and RW outside home  ADL's / Homemaking Assistance Needed: B/D self, feeds and grooms himself        Hand Dominance   Dominant Hand: Right    Extremity/Trunk Assessment   Upper Extremity Assessment Upper Extremity Assessment: Defer to OT evaluation  Lower Extremity Assessment Lower Extremity Assessment: Generalized weakness    Cervical / Trunk Assessment Cervical / Trunk Assessment: Normal  Communication   Communication: HOH  Cognition Arousal/Alertness: Awake/alert Behavior During Therapy: WFL for tasks assessed/performed Overall Cognitive Status: Within Functional Limits for tasks assessed                                         General Comments      Exercises General Exercises - Lower Extremity Ankle Circles/Pumps: AROM;Both;10 reps;Supine Long Arc Quad: AROM;Both;10 reps;Seated   Assessment/Plan    PT Assessment Patient needs continued PT services  PT Problem List Decreased activity tolerance;Decreased balance;Decreased mobility;Decreased knowledge of use of DME;Decreased safety awareness;Decreased knowledge of precautions;Pain       PT Treatment Interventions DME instruction;Gait training;Functional mobility training;Therapeutic activities;Therapeutic exercise;Balance training;Patient/family education;Stair training    PT Goals (Current goals can be found in the Care Plan section)  Acute Rehab PT Goals Patient Stated Goal: to go hoome PT Goal Formulation: With patient Time For Goal Achievement: 07/05/18 Potential to Achieve Goals: Good    Frequency Min 3X/week   Barriers to discharge        Co-evaluation               AM-PAC PT "6 Clicks" Mobility  Outcome Measure Help needed turning from your back to your side while in a flat bed without using bedrails?: None Help needed moving from lying on your back to sitting on the side of a flat bed without using bedrails?: None Help needed moving to and from a bed to a chair (including a wheelchair)?: A Little Help needed standing up from a chair using your arms (e.g., wheelchair or bedside chair)?: A Little Help needed to walk in hospital room?: A Lot Help needed climbing 3-5 steps with a railing? : A Lot 6 Click Score: 18    End of Session Equipment Utilized During Treatment: Gait belt;Oxygen Activity Tolerance: Patient limited by fatigue Patient left: with call bell/phone within reach;with nursing/sitter in room(on 3N1) Nurse Communication: Mobility status PT Visit Diagnosis: Muscle weakness (generalized) (M62.81);Pain Pain - part of body: (abdomen)    Time: 0109-3235 PT Time Calculation (min) (ACUTE ONLY): 27 min   Charges:    PT Evaluation $PT Eval Moderate Complexity: 1 Mod PT Treatments $Therapeutic Activity: 8-22 mins        [place] Pager:  [phone]  Office:  [place] 06/21/2018, 12:41 PM

## 2018-06-21 NOTE — Discharge Summary (Signed)
[facility] Discharge Summary  Patient name: Wayne [name] Medical record number: [id number] Date of birth: [birth year] Age: 85 y.o. Gender: male Date of Admission: 06/20/2018  Date of Discharge: 06/28/2018 Admitting Physician: Martyn Malay, MD  Primary Care Provider: [name], DO Consultants: General Surgery, Cardiology  Indication for Hospitalization: SIRS, Afib with RVR  Discharge Diagnoses/Problem List:  Sepsis secondary to acute appendicitis O2 requirement: Likely secondary to CHF exacerbation Hypokalemia Atrial fibrillation Normocytic anemia Elbow injury status post fall History of CVA with balance deficit Chronic constipation BPH  Disposition: Home with Home Health  Discharge Condition: stable  Discharge Exam:  Physical Exam: General: 85 y.o. male in NAD, sitting up eating breakfast Cardio: Irregularly irregular Lungs: CTAB, no wheezing, no rhonchi, no crackles, no increased work of breathing, on room air Abdomen: Soft, non-tender to palpation, positive bowel sounds Skin: warm and dry Extremities: No edema  Brief Hospital Course:  Wayne [name] a 85 y.o.who presented with weakness and fall. PMH is significant forA. fib on chronic anticoagulation, aortic valve replacement with porcine valve, CAD, CVD, BPH, MI s/p PCI, CVA,HFpEF,and hypertension.  His hospital course is outlined below.  SIRS Admission details can be found in H&P.  Patient met Sirs criteria on admission and source could not be identified as urine and chest x-ray were negative.  He did have appendicitis that was treated medically in November and on CT was improved, but general surgery did not believe that this was the source of the patient's sepsis.  Blood cultures were drawn prior to starting patient on broad-spectrum antibiotics.  These showed no growth at 5 days.  Patient was on vancomycin, cefepime, Flagyl, with vancomycin being discontinued on  1/6.  Flagyl was discontinued on 1/8 as patient had completed sufficient anaerobic coverage.  He continued on IV cefepime until 1/10.  He was then transitioned to cefdinir, and will complete this through 1/11..  Patient's overall condition improved following appendectomy for appendicitis and antibiotic treatment, therefore it is likely that the appendicitis was the cause of the patient's SIRS at presentation.  At the time of discharge the patient was with stable vitals, pain was well controlled, had remained afebrile, and was tolerating a PO diet.  Oxygen Desaturation Patient had O2 desaturation requiring oxygen therapy following surgery.  He continued to have intermittent oxygen desaturations on room air and was placed back on oxygen greater than 24 hours after surgery.  Chest x-ray was obtained and revealed moderate b/l pulmonary effusions likely 2/2 CHF and holding Lasix while septic with low Bps.  Patient was diuresed following cardiology recommendations.  O2 requirement improved following diuresis.  He was transitioned to his home dose of Lasix and should continue with regular potassium repletion with Lasix dose.  Patient was weaned to room air and was with stable oxygen saturation on room air at rest and while ambulating on discharge.  Appendicitis Patient had been hospitalized in November and treated medically for appendicitis.  Upon presentation he had severe right and left lower quadrant abdominal pain.  General surgery made the decision to take the patient to surgery for an appendectomy.  This was performed on 1/4.  Patient tolerated the surgery well and was tolerating a regular diet at discharge.  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  These findings were communicated to the patient and his family by surgery.  An oncology consult should be considered as outpatient.  Atrial Fibrillation and TAVR Patient is chronically anticoagulated on warfarin with INR  goal of 2-3.   His warfarin was held for surgery and patient received oral Vitamin K.  Heparin was started when INR <2.  Following surgery patient was bridged back to warfarin without complication.  Hgb was stable throughout hospitalization.  Patient's INR was 2.25 on discharge.  He was discharged on his home dose of warfarin.  He should have a recheck INR on 1/13, this was initiated prior to his discharge, and office was to call patient with time.  Asymptomatic Bacteruria Patient's urine culture grew 30,000 colonies Enterococcus faecalis, but was without urinary complaints.  Patient was not treated for UTI, although did receive 5 days total of vancomycin therapy which would have been adequate to treat.  Issues for Follow Up:  1. 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  Consider oncology follow up as outpatient. 2. Recommend BMP to check potassium at follow-up appointment as patient was hypokalemic during admission.  He was sent home with directions to take K-Dur 10 mEq daily with Lasix. 3. Patient should follow-up with cardiology as outpatient in 8 weeks  Significant Procedures: Appendectomy 1/4  Significant Labs and Imaging:  Recent Labs  Lab 06/26/18 0219 06/27/18 0244 06/28/18 0309  WBC 8.8 8.8 7.8  HGB 11.6* 11.8* 11.7*  HCT 35.0* 36.6* 36.2*  PLT 149* 169 184   Recent Labs  Lab 06/23/18 0233 06/24/18 0222 06/25/18 0319 06/26/18 0219 06/27/18 0244 06/28/18 0309  NA 140 140 140 138 137 135  K 4.0 4.5 3.4* 3.3* 3.8 3.9  CL 115* 115* 109 102 98 97*  CO2 18* 19* _0 GLUCOSE 178* 108* 110* 104* 100* 98  BUN _1 CREATININE 0.96 0.84 0.84 0.67 0.69 0.81  CALCIUM 8.3* 8.3* 8.1* 8.0* 8.4* 8.5*  MG  --   --  1.9  --  2.0  --   ALKPHOS 33*  --   --   --   --   --   AST 30  --   --   --   --   --   ALT 16  --   --   --   --   --   ALBUMIN 2.8*  --   --   --   --   --     Dg Elbow 2 Views Left  Result Date: 06/20/2018 CLINICAL DATA:  Status  post fall, altered mental status EXAM: LEFT ELBOW - 2 VIEW COMPARISON:  None. FINDINGS: Subtle lucency along the articular surface of the left radial head which may be projectional versus a subtle nondisplaced fracture. No other fracture or dislocation. Minimal osteoarthritis of the ulnohumeral joint. Soft tissues are unremarkable. IMPRESSION: Subtle lucency along the articular surface of the left radial head which may be projectional versus a subtle nondisplaced fracture. Recommend oblique views of the elbow for further evaluation. Electronically Signed   By: Kathreen Devoid   On: 06/20/2018 09:48   Dg Elbow Complete Left (3+view)  Result Date: 06/20/2018 CLINICAL DATA:  Fall with left elbow pain. EXAM: LEFT ELBOW - COMPLETE 3+ VIEW COMPARISON:  Elbow radiographs obtained earlier today at 9:29 a.m. FINDINGS: No fracture. Specifically, there is no evidence of a radial head fracture. No bone lesion. Elbow joint is normally spaced and aligned. No joint effusion. Soft tissues are unremarkable. IMPRESSION: Negative. Electronically Signed   By: Lajean Manes M.D.   On: 06/20/2018 18:25   Ct Head Wo Contrast  Result Date: 06/20/2018 CLINICAL DATA:  Head trauma. EXAM: CT HEAD WITHOUT CONTRAST TECHNIQUE: Contiguous axial images were obtained from the base of the skull through the vertex without intravenous contrast. COMPARISON:  CT scan of January 21, 2018. FINDINGS: Brain: Mild diffuse cortical atrophy is noted. Mild chronic ischemic white matter disease is noted. No mass effect or midline shift is noted. Ventricular size is within normal limits. There is no evidence of mass lesion, hemorrhage or acute infarction. Vascular: No hyperdense vessel or unexpected calcification. Skull: Normal. Negative for fracture or focal lesion. Sinuses/Orbits: No acute finding. Other: None. IMPRESSION: Mild diffuse cortical atrophy. Mild chronic ischemic white matter disease. No acute intracranial abnormality seen. Electronically Signed    By: Marijo Conception, M.D.   On: 06/20/2018 11:34   Ct Abdomen Pelvis W Contrast  Result Date: 06/20/2018 CLINICAL DATA:  Generalized abdominal pain with fever EXAM: CT ABDOMEN AND PELVIS WITH CONTRAST TECHNIQUE: Multidetector CT imaging of the abdomen and pelvis was performed using the standard protocol following bolus administration of intravenous contrast. CONTRAST:  122m OMNIPAQUE IOHEXOL 300 MG/ML  SOLN COMPARISON:  06/21/2017 FINDINGS: Lower chest: Cardiomegaly.  Dependent atelectasis.  No effusions. Hepatobiliary: No focal hepatic abnormality. Gallbladder unremarkable. Pancreas: No focal abnormality or ductal dilatation. Spleen: No focal abnormality.  Normal size. Adrenals/Urinary Tract: No hydronephrosis. Small cyst in the midpole of the left kidney. Adrenal glands unremarkable. Left posterior bladder wall diverticulum measures 4 cm, stable. Stomach/Bowel: Appendix remains borderline dilated at 8 mm. There is a small fluid collection adjacent to the tip of the appendix in the area of prior phlegmonous change, measuring maximally 1.3 cm. Mild surrounding inflammation in the right lower quadrant. No evidence of bowel obstruction. Few scattered sigmoid diverticula. Vascular/Lymphatic: Aortic atherosclerosis. No enlarged abdominal or pelvic lymph nodes. Reproductive: Mildly prominent prostate with central calcifications. Other: No free fluid or free air. Musculoskeletal: No acute bony abnormality. IMPRESSION: Continued mild inflammation in the right lower quadrant adjacent to the appendix which is mildly dilated at 8 mm compared with 11 mm previously. Very small focal fluid collection adjacent to the tip of the appendix measures maximally 1.3 cm. This is too small to drain. Aortic atherosclerosis. Prostate prominence with left posterior bladder wall diverticulum, stable. Electronically Signed   By: KRolm BaptiseM.D.   On: 06/20/2018 11:34   Dg Chest Port 1 View  Result Date: 06/20/2018 CLINICAL DATA:  Pain  following fall EXAM: PORTABLE CHEST 1 VIEW COMPARISON:  April 23, 2018 FINDINGS: There is no edema or consolidation. Heart is slightly enlarged, stable, with pulmonary vascularity normal. Patient is status post aortic valve replacement. There is aortic atherosclerosis. No adenopathy evident. There is degenerative change in each shoulder. IMPRESSION: No edema or consolidation. Mild cardiomegaly, stable. Status post aortic valve replacement. There is aortic atherosclerosis. Aortic Atherosclerosis (ICD10-I70.0). Electronically Signed   By: WLowella GripIII M.D.   On: 06/20/2018 09:46   Surgical Path: Low-grade appendiceal mucinous neoplasm  Results/Tests Pending at Time of Discharge: none  Discharge Medications:  Allergies as of 06/28/2018      Reactions   Penicillins Rash, Other (See Comments)   PATIENT HAS HAD A PCN REACTION WITH IMMEDIATE RASH, FACIAL/TONGUE/THROAT SWELLING, SOB, OR LIGHTHEADEDNESS WITH HYPOTENSION:  #  #  #  YES  #  #  #   Has patient had a PCN reaction causing severe rash involving mucus membranes or skin necrosis: No Has patient had a PCN reaction that required hospitalization No Has patient had a PCN reaction occurring within the last 10  years: No If all of the above answers are "NO", then may proceed with Cephalosporin use.   Prednisone Other (See Comments)   Wheezing   Procaine Hcl Other (See Comments)   Passed out after being given this at dental appointment   Levofloxacin Rash   Oxycodone Other (See Comments)   constipation      Medication List    STOP taking these medications   Cholecalciferol 50 MCG (2000 UT) Tabs   enoxaparin 80 MG/0.8ML injection Commonly known as:  LOVENOX   senna 8.6 MG Tabs tablet Commonly known as:  SENOKOT     TAKE these medications   acetaminophen 500 MG tablet Commonly known as:  TYLENOL Take 500 mg by mouth every 6 (six) hours as needed for moderate pain or headache.   alfuzosin 10 MG 24 hr tablet Commonly known as:   UROXATRAL Take 10 mg by mouth daily with breakfast.   ARTIFICIAL TEAR OP Place 1 drop into both eyes daily as needed (dry eyes).   aspirin EC 81 MG tablet Take 81 mg by mouth daily.   BLUE-EMU MAXIMUM STRENGTH EX Apply 1 application topically 3 (three) times daily as needed (muscle pain in back). alterrnates between [employer] and Blu Emu   cefdinir 300 MG capsule Commonly known as:  OMNICEF Take 1 capsule (300 mg total) by mouth every 12 (twelve) hours for 2 days.   digoxin 0.125 MG tablet Commonly known as:  LANOXIN Take 1 tablet (125 mcg total) by mouth daily.   docusate sodium 100 MG capsule Commonly known as:  COLACE Take 100 mg by mouth every evening.   finasteride 5 MG tablet Commonly known as:  PROSCAR Take 5 mg by mouth at bedtime.   furosemide 40 MG tablet Commonly known as:  LASIX Take 1 tablet (40 mg total) by mouth daily. Start taking on:  June 29, 2018 What changed:    how much to take  when to take this  reasons to take this  Another medication with the same name was removed. Continue taking this medication, and follow the directions you see here.   Melatonin 10 MG Tabs Take 10 mg by mouth at bedtime.   metoprolol tartrate 25 MG tablet Commonly known as:  LOPRESSOR Take 0.5 tablets (12.5 mg total) by mouth 2 (two) times daily. What changed:  Another medication with the same name was removed. Continue taking this medication, and follow the directions you see here.   polyethylene glycol packet Commonly known as:  MIRALAX / GLYCOLAX Take 17 g by mouth daily as needed for mild constipation.   potassium chloride 10 MEQ tablet Commonly known as:  K-DUR,KLOR-CON Take 1 tablet (10 mEq total) by mouth daily.   potassium chloride 10 MEQ tablet Commonly known as:  K-DUR Take 5 mEq by mouth daily as needed for fluid. TAKE POTASSIUM IF YOU TAKE LASIX   saccharomyces boulardii 250 MG capsule Commonly known as:  FLORASTOR Take 1 capsule (250 mg  total) by mouth 2 (two) times daily.   sodium chloride 0.65 % Soln nasal spray Commonly known as:  OCEAN Place 1 spray into both nostrils daily as needed for congestion.   triamcinolone cream 0.1 % Commonly known as:  KENALOG Apply 1 application topically daily as needed (dry skin).   warfarin 2.5 MG tablet Commonly known as:  COUMADIN Take as directed. If you are unsure how to take this medication, talk to your nurse or doctor. Original instructions:  TAKE 1 TO 2 TABLETS  BY MOUTH ONCE DAILY AS  DIRECTED  BY  COUMADIN  CLINIC What changed:  See the new instructions.       Discharge Instructions: Please refer to Patient Instructions section of EMR for full details.  Patient was counseled important signs and symptoms that should prompt return to medical care, changes in medications, dietary instructions, activity restrictions, and follow up appointments.   Follow-Up Appointments: Follow-up Information    Leighton Ruff, MD. [name] on 7/49/4496.   Specialty:  General Surgery Why:  Your appointment is 07/08/18 at 4:00PM to follow up regarding your recent surgery. Please arrive 30 minutes prior to your appointment to check in and fill out paperwork. Bring photo ID and insurance information. Contact information: [place] [address] [phone]        Lelon Perla, MD Follow up.   Specialty:  Cardiology Why:  our office will call you with a follow-up appointment in 2 months with Dr. Leroy Libman information: [address] [place] [address] [place], [place] Follow up.   Specialty:  [place] Why:  [facility] Contact information: [address] [place] [address] [phone]        [name] on 07/01/2018.   Why:  They will contact you with appointment time Contact information: Dr. Jacalyn Lefevre Office       [name], DO. Schedule an appointment as soon as  possible for a visit in 1 week(s).   Specialty:  Family Medicine Contact information: [place] [address] [phone]           Cleophas Dunker, DO 06/28/2018, 4:26 PM PGY-1, [place]

## 2018-06-22 ENCOUNTER — Encounter (HOSPITAL_COMMUNITY)
Admission: EM | Disposition: A | Discharge status: Home Health | Payer: Self-pay | Source: Home / Self Care | Attending: Family Medicine

## 2018-06-22 ENCOUNTER — Inpatient Hospital Stay (HOSPITAL_COMMUNITY): Payer: Medicare Other | Admitting: Anesthesiology

## 2018-06-22 ENCOUNTER — Encounter (HOSPITAL_COMMUNITY): Payer: Self-pay | Admitting: Certified Registered"

## 2018-06-22 HISTORY — PX: LAPAROSCOPIC APPENDECTOMY: SHX408

## 2018-06-22 LAB — BASIC METABOLIC PANEL
Anion gap: 11 (ref 5–15)
BUN: 11 mg/dL (ref 8–23)
CO2: 17 mmol/L — ABNORMAL LOW (ref 22–32)
Calcium: 8.3 mg/dL — ABNORMAL LOW (ref 8.9–10.3)
Chloride: 113 mmol/L — ABNORMAL HIGH (ref 98–111)
Creatinine, Ser: 0.94 mg/dL (ref 0.61–1.24)
GFR calc Af Amer: >60 mL/min (ref >60)
GFR calc non Af Amer: >60 mL/min (ref >60)
Glucose, Bld: 85 mg/dL (ref 70–99)
Potassium: 3 mmol/L — ABNORMAL LOW (ref 3.5–5.1)
Sodium: 141 mmol/L (ref 135–145)

## 2018-06-22 LAB — PROTIME-INR
INR: 2.04
INR: 1.74
Prothrombin Time: 22.8 seconds — ABNORMAL HIGH (ref 11.4–15.2)
Prothrombin Time: 20.1 seconds — ABNORMAL HIGH (ref 11.4–15.2)

## 2018-06-22 LAB — CBC
HCT: 34.9 % — ABNORMAL LOW (ref 39.0–52.0)
Hemoglobin: 10.8 g/dL — ABNORMAL LOW (ref 13.0–17.0)
MCH: 29.2 pg (ref 26.0–34.0)
MCHC: 30.9 g/dL (ref 30.0–36.0)
MCV: 94.3 fL (ref 80.0–100.0)
Platelets: 100 10*3/uL — ABNORMAL LOW (ref 150–400)
RBC: 3.7 MIL/uL — ABNORMAL LOW (ref 4.22–5.81)
RDW: 16.2 % — ABNORMAL HIGH (ref 11.5–15.5)
WBC: 7.9 10*3/uL (ref 4.0–10.5)
nRBC: 0 % (ref 0.0–0.2)

## 2018-06-22 SURGERY — APPENDECTOMY, LAPAROSCOPIC
Anesthesia: General | Site: Abdomen

## 2018-06-22 MED ORDER — 0.9 % SODIUM CHLORIDE (POUR BTL) OPTIME
TOPICAL | Status: DC | PRN
  Administered 2018-06-22: 1000 mL

## 2018-06-22 MED ORDER — SUGAMMADEX SODIUM 200 MG/2ML IV SOLN
INTRAVENOUS | Status: DC | PRN
  Administered 2018-06-22: 200 mg via INTRAVENOUS

## 2018-06-22 MED ORDER — HEPARIN SODIUM (PORCINE) 5000 UNIT/ML IJ SOLN
5000.0000 [IU] | Freq: Three times a day (TID) | INTRAMUSCULAR | Status: DC
  Administered 2018-06-21 – 2018-06-23 (×2): 5000 [IU] via SUBCUTANEOUS
  Filled 2018-06-22 (×2): qty 1

## 2018-06-22 MED ORDER — BUPIVACAINE-EPINEPHRINE (PF) 0.25% -1:200000 IJ SOLN
INTRAMUSCULAR | Status: AC
  Filled 2018-06-22: qty 30

## 2018-06-22 MED ORDER — FENTANYL CITRATE (PF) 100 MCG/2ML IJ SOLN
INTRAMUSCULAR | Status: AC
  Filled 2018-06-22: qty 2

## 2018-06-22 MED ORDER — FENTANYL CITRATE (PF) 250 MCG/5ML IJ SOLN
INTRAMUSCULAR | Status: AC
  Filled 2018-06-22: qty 5

## 2018-06-22 MED ORDER — SODIUM CHLORIDE 0.9 % IV SOLN
INTRAVENOUS | Status: DC | PRN
  Administered 2018-06-22: 40 ug/min via INTRAVENOUS

## 2018-06-22 MED ORDER — VITAMIN K1 10 MG/ML IJ SOLN
5.0000 mg | Freq: Once | INTRAVENOUS | Status: AC
  Administered 2018-06-22: 5 mg via INTRAVENOUS
  Filled 2018-06-22: qty 0.5

## 2018-06-22 MED ORDER — LIDOCAINE 2% (20 MG/ML) 5 ML SYRINGE
INTRAMUSCULAR | Status: DC | PRN
  Administered 2018-06-22: 20 mg via INTRAVENOUS

## 2018-06-22 MED ORDER — DEXAMETHASONE SODIUM PHOSPHATE 10 MG/ML IJ SOLN
INTRAMUSCULAR | Status: DC | PRN
  Administered 2018-06-22: 10 mg via INTRAVENOUS

## 2018-06-22 MED ORDER — METOPROLOL TARTRATE 12.5 MG HALF TABLET
12.5000 mg | ORAL_TABLET | Freq: Two times a day (BID) | ORAL | Status: DC
  Administered 2018-06-22 – 2018-06-28 (×11): 12.5 mg via ORAL
  Filled 2018-06-22 (×11): qty 1

## 2018-06-22 MED ORDER — FENTANYL CITRATE (PF) 100 MCG/2ML IJ SOLN
25.0000 ug | INTRAMUSCULAR | Status: DC | PRN
  Administered 2018-06-22: 25 ug via INTRAVENOUS

## 2018-06-22 MED ORDER — IPRATROPIUM-ALBUTEROL 0.5-2.5 (3) MG/3ML IN SOLN
3.0000 mL | Freq: Once | RESPIRATORY_TRACT | Status: AC
  Administered 2018-06-22: 3 mL via RESPIRATORY_TRACT
  Filled 2018-06-22: qty 3

## 2018-06-22 MED ORDER — OXYCODONE HCL 5 MG PO TABS
5.0000 mg | ORAL_TABLET | Freq: Four times a day (QID) | ORAL | Status: DC | PRN
  Administered 2018-06-22: 5 mg via ORAL
  Filled 2018-06-22 (×2): qty 1

## 2018-06-22 MED ORDER — PHENYLEPHRINE 40 MCG/ML (10ML) SYRINGE FOR IV PUSH (FOR BLOOD PRESSURE SUPPORT)
PREFILLED_SYRINGE | INTRAVENOUS | Status: DC | PRN
  Administered 2018-06-22: 80 ug via INTRAVENOUS

## 2018-06-22 MED ORDER — DEXAMETHASONE SODIUM PHOSPHATE 10 MG/ML IJ SOLN
INTRAMUSCULAR | Status: AC
  Filled 2018-06-22: qty 1

## 2018-06-22 MED ORDER — ONDANSETRON HCL 4 MG/2ML IJ SOLN
INTRAMUSCULAR | Status: AC
  Filled 2018-06-22: qty 2

## 2018-06-22 MED ORDER — ONDANSETRON HCL 4 MG/2ML IJ SOLN
INTRAMUSCULAR | Status: DC | PRN
  Administered 2018-06-22: 4 mg via INTRAVENOUS

## 2018-06-22 MED ORDER — FUROSEMIDE 10 MG/ML IJ SOLN
INTRAMUSCULAR | Status: AC
  Filled 2018-06-22: qty 4

## 2018-06-22 MED ORDER — FUROSEMIDE 10 MG/ML IJ SOLN
INTRAMUSCULAR | Status: DC | PRN
  Administered 2018-06-22: 20 mg via INTRAMUSCULAR

## 2018-06-22 MED ORDER — ROCURONIUM BROMIDE 50 MG/5ML IV SOSY
PREFILLED_SYRINGE | INTRAVENOUS | Status: DC | PRN
  Administered 2018-06-22: 50 mg via INTRAVENOUS

## 2018-06-22 MED ORDER — PROPOFOL 10 MG/ML IV BOLUS
INTRAVENOUS | Status: DC | PRN
  Administered 2018-06-22: 70 mg via INTRAVENOUS

## 2018-06-22 MED ORDER — SODIUM CHLORIDE 0.9 % IV SOLN
INTRAVENOUS | Status: DC | PRN
  Administered 2018-06-22: 11:00:00 via INTRAVENOUS

## 2018-06-22 MED ORDER — LIDOCAINE 2% (20 MG/ML) 5 ML SYRINGE
INTRAMUSCULAR | Status: AC
  Filled 2018-06-22: qty 5

## 2018-06-22 MED ORDER — SODIUM CHLORIDE 0.9 % IR SOLN
Status: DC | PRN
  Administered 2018-06-22: 1000 mL

## 2018-06-22 MED ORDER — POTASSIUM CHLORIDE CRYS ER 20 MEQ PO TBCR
40.0000 meq | EXTENDED_RELEASE_TABLET | Freq: Two times a day (BID) | ORAL | Status: AC
  Administered 2018-06-22 – 2018-06-23 (×2): 40 meq via ORAL
  Filled 2018-06-22 (×2): qty 2

## 2018-06-22 MED ORDER — ROCURONIUM BROMIDE 50 MG/5ML IV SOSY
PREFILLED_SYRINGE | INTRAVENOUS | Status: AC
  Filled 2018-06-22: qty 5

## 2018-06-22 MED ORDER — PROPOFOL 10 MG/ML IV BOLUS
INTRAVENOUS | Status: AC
  Filled 2018-06-22: qty 20

## 2018-06-22 MED ORDER — POLYETHYLENE GLYCOL 3350 17 G PO PACK
17.0000 g | PACK | Freq: Every day | ORAL | Status: DC
  Administered 2018-06-23 – 2018-06-25 (×3): 17 g via ORAL
  Filled 2018-06-22 (×4): qty 1

## 2018-06-22 MED ORDER — FENTANYL CITRATE (PF) 100 MCG/2ML IJ SOLN
INTRAMUSCULAR | Status: DC | PRN
  Administered 2018-06-22: 50 ug via INTRAVENOUS

## 2018-06-22 MED ORDER — ESMOLOL HCL 100 MG/10ML IV SOLN
INTRAVENOUS | Status: AC
  Filled 2018-06-22: qty 10

## 2018-06-22 MED ORDER — BUPIVACAINE-EPINEPHRINE 0.25% -1:200000 IJ SOLN
INTRAMUSCULAR | Status: DC | PRN
  Administered 2018-06-22: 16 mL

## 2018-06-22 MED ORDER — ESMOLOL HCL 100 MG/10ML IV SOLN
INTRAVENOUS | Status: DC | PRN
  Administered 2018-06-22 (×2): 20 mg via INTRAVENOUS

## 2018-06-22 SURGICAL SUPPLY — 47 items
ADH SKN CLS APL DERMABOND .7 (GAUZE/BANDAGES/DRESSINGS) ×1
APPLIER CLIP ROT 10 11.4 M/L (STAPLE)
APR CLP MED LRG 11.4X10 (STAPLE)
BAG SPEC RTRVL 10 TROC 200 (ENDOMECHANICALS) ×1
BLADE CLIPPER SURG (BLADE) ×1 IMPLANT
CANISTER SUCT 3000ML PPV (MISCELLANEOUS) ×2 IMPLANT
CHLORAPREP W/TINT 26ML (MISCELLANEOUS) ×2 IMPLANT
CLIP APPLIE ROT 10 11.4 M/L (STAPLE) IMPLANT
COVER SURGICAL LIGHT HANDLE (MISCELLANEOUS) ×2 IMPLANT
COVER WAND RF STERILE (DRAPES) ×1 IMPLANT
CUTTER FLEX LINEAR 45M (STAPLE) ×2 IMPLANT
DERMABOND ADVANCED (GAUZE/BANDAGES/DRESSINGS) ×1
DERMABOND ADVANCED .7 DNX12 (GAUZE/BANDAGES/DRESSINGS) ×1 IMPLANT
ELECT REM PT RETURN 9FT ADLT (ELECTROSURGICAL) ×2
ELECTRODE REM PT RTRN 9FT ADLT (ELECTROSURGICAL) ×1 IMPLANT
GLOVE BIO SURGEON STRL SZ8 (GLOVE) ×2 IMPLANT
GLOVE BIOGEL PI IND STRL 8 (GLOVE) ×1 IMPLANT
GLOVE BIOGEL PI INDICATOR 8 (GLOVE) ×1
GOWN STRL REUS W/ TWL LRG LVL3 (GOWN DISPOSABLE) ×2 IMPLANT
GOWN STRL REUS W/ TWL XL LVL3 (GOWN DISPOSABLE) ×1 IMPLANT
GOWN STRL REUS W/TWL LRG LVL3 (GOWN DISPOSABLE) ×4
GOWN STRL REUS W/TWL XL LVL3 (GOWN DISPOSABLE) ×2
KIT BASIN OR (CUSTOM PROCEDURE TRAY) ×2 IMPLANT
KIT TURNOVER KIT B (KITS) ×2 IMPLANT
NEEDLE 22X1 1/2 (OR ONLY) (NEEDLE) ×2 IMPLANT
NS IRRIG 1000ML POUR BTL (IV SOLUTION) ×2 IMPLANT
PAD ARMBOARD 7.5X6 YLW CONV (MISCELLANEOUS) ×4 IMPLANT
POUCH RETRIEVAL ECOSAC 10 (ENDOMECHANICALS) ×1 IMPLANT
POUCH RETRIEVAL ECOSAC 10MM (ENDOMECHANICALS) ×1
RELOAD 45 VASCULAR/THIN (ENDOMECHANICALS) ×2 IMPLANT
RELOAD STAPLE 45 2.5 WHT GRN (ENDOMECHANICALS) IMPLANT
RELOAD STAPLE 45 3.5 BLU ETS (ENDOMECHANICALS) IMPLANT
RELOAD STAPLE TA45 3.5 REG BLU (ENDOMECHANICALS) IMPLANT
SCISSORS LAP 5X35 DISP (ENDOMECHANICALS) IMPLANT
SET IRRIG TUBING LAPAROSCOPIC (IRRIGATION / IRRIGATOR) ×2 IMPLANT
SET TUBE SMOKE EVAC HIGH FLOW (TUBING) ×2 IMPLANT
SHEARS HARMONIC ACE PLUS 36CM (ENDOMECHANICALS) ×2 IMPLANT
SPECIMEN JAR SMALL (MISCELLANEOUS) ×2 IMPLANT
SUT VIC AB 4-0 PS2 27 (SUTURE) ×2 IMPLANT
TOWEL OR 17X24 6PK STRL BLUE (TOWEL DISPOSABLE) ×2 IMPLANT
TOWEL OR 17X26 10 PK STRL BLUE (TOWEL DISPOSABLE) ×1 IMPLANT
TRAY FOLEY CATH SILVER 16FR (SET/KITS/TRAYS/PACK) ×2 IMPLANT
TRAY LAPAROSCOPIC MC (CUSTOM PROCEDURE TRAY) ×2 IMPLANT
TROCAR XCEL 12X100 BLDLESS (ENDOMECHANICALS) ×2 IMPLANT
TROCAR XCEL BLUNT TIP 100MML (ENDOMECHANICALS) ×2 IMPLANT
TROCAR XCEL NON-BLD 5MMX100MML (ENDOMECHANICALS) ×2 IMPLANT
WATER STERILE IRR 1000ML POUR (IV SOLUTION) ×2 IMPLANT

## 2018-06-22 NOTE — Transfer of Care (Signed)
Immediate Anesthesia Transfer of Care Note  Patient: Wayne [name]  Procedure(s) Performed: APPENDECTOMY LAPAROSCOPIC (N/A Abdomen)  Patient Location: PACU  Anesthesia Type:General  Level of Consciousness: drowsy and patient cooperative  Airway & Oxygen Therapy: Patient Spontanous Breathing and Patient connected to face mask oxygen  Post-op Assessment: Report given to RN and Post -op Vital signs reviewed and stable  Post vital signs: Reviewed and stable  Last Vitals:  Vitals Value Taken Time  BP 96/51 06/22/2018 12:07 PM  Temp    Pulse 107 06/22/2018 12:09 PM  Resp 30 06/22/2018 12:08 PM  SpO2 98 % 06/22/2018 12:09 PM  Vitals shown include unvalidated device data.  Last Pain:  Vitals:   06/22/18 0945  TempSrc: Oral  PainSc:          Complications: No apparent anesthesia complications

## 2018-06-22 NOTE — Op Note (Signed)
06/22/2018  11:59 AM  PATIENT:  Wayne [name]  85 y.o. male  PRE-OPERATIVE DIAGNOSIS:  Appendicitis  POST-OPERATIVE DIAGNOSIS:  Perforated appendicitis  PROCEDURE:  Procedure(s): APPENDECTOMY LAPAROSCOPIC  SURGEON:  Surgeon(s): Georganna Skeans, MD  ASSISTANTS: none   ANESTHESIA:   local and general  EBL:  Total I/O In: 1859.1 [I.V.:1523.8; IV Piggyback:335.3] Out: 500 [Urine:500]  BLOOD ADMINISTERED:none  DRAINS: none   SPECIMEN:  Excision  DISPOSITION OF SPECIMEN:  PATHOLOGY  COUNTS:  YES  DICTATION: .Dragon Dictation Findings: Perforated appendicitis without significant abscess  Procedure in detail: Patient was brought to the operating room for appendectomy.  Informed consent was obtained.  He is currently receiving IV antibiotics.  General endotracheal anesthesia was administered by the anesthesia staff after nursing placed a Foley catheter.  His abdomen was prepped and draped in sterile fashion.  Timeout procedure was performed.The infraumbilical region was infiltrated with local. Infraumbilical incision was made. Subcutaneous tissues were dissected down revealing the anterior fascia. This was divided sharply along the midline. Peritoneal cavity was entered under direct vision without complication. A 0 Vicryl pursestring was placed around the fascial opening. Hassan trocar was inserted into the abdomen. The abdomen was insufflated with carbon dioxide in standard fashion. Under direct vision a 12 mm left lower quadrant and a 5 mm right mid abdominal port were placed.  Local was used at each port site.  Laparoscopic exploration revealed the appendix with an inflamed distal third which was wrapped in omentum I peeled away the omentum and this revealed a small perforation.  There was no significant abscess.  The mesoappendix was divided with the harmonic scalpel achieving excellent hemostasis.  The base of the appendix was divided with Endo GIA with a vascular load.  The  appendix was placed in a bag and removed from the abdomen.  It was sent to pathology.  The abdomen was copiously irrigated.  The harmonic scalpel was used to get hemostasis on the portion of omentum that had been wrapped around the appendix.  Staple line was intact.  There was no further bleeding.  Ports were removed under direct vision.  Pneumoperitoneum was released.  Infraumbilical fascia was closed by tying the pursestring.  All 3 wounds were irrigated the skin of each was closed with 4-0 Vicryl.  I did use some interrupted 4-0 Vicryls on the left lower quadrant port site because there were some subcutaneous bleeding.  Dermabond was placed on all wounds.  All counts were correct.  He tolerated the procedure well and was taken recovery room in guarded condition.  PATIENT DISPOSITION:  PACU - guarded condition.   Delay start of Pharmacological VTE agent (>24hrs) due to surgical blood loss or risk of bleeding:  no  Georganna Skeans, MD, MPH, FACS Pager: [phone]  1/4/202011:59 AM

## 2018-06-22 NOTE — Progress Notes (Signed)
Family Medicine Teaching Service Daily Progress Note Intern Pager: [phone]  Patient name: Wayne [name] Medical record number: [id number] Date of birth: 11-10-1929 Age: 85 y.o. Gender: male  Primary Care Provider: [name], DO Consultants: Cardiology, General Surgery Code Status: DNR  Pt Overview and Major Events to Date:  1/2: Admitted for sepsis d/t appendicitis 1/3: GenSurg plans Sx 1/4 1/4: Laproscopic appendectomy  Assessment and Plan: Wayne [name] is a 85 y.o. male presenting with weakness and fall. PMH is significant for A. fib on chronic anticoagulation, aortic valve replacement with porcine valve, CAD, CVD, BPH, MI s/p PCI, CVA, HFpEF, and hypertension.  Sepsis secondary to acute appendicitis: Medically stable on broad-spectrum antibiotics.  Blood cultures negative at 2 days, urine culture 30,000 colonies of an identified organism.  No signs of acute abdomen.  Hemodynamically stable.  Moderate risk for high risk surgery per cardiology consult.  General surgery planning to perform laparoscopic appendectomy 1/4. - General surgery consulted, appreciate recommendations - Continue vancomycin+cefepime+Flagyl, day 3, await final results for blood and urine cultures - Telemetry - N.p.o. with ice chips pending surgery - Holding Lopressor, aspirin, warfarin pending surgery   Atrial fibrillation  HFpEF: Chronic.  History of TAVR in March 2019.  On Coumadin with INR goal of 2-3.  Previously in RVR while off metoprolol due to planned surgery.  Hemodynamically stable as mentioned above.  Last echo 10/2017 with EF 55-60%, severe dilated L&R atrium, severe mitral regurg. - Telemetry - Continue home digoxin, plan to restart Lopressor 12.5 mg twice daily following surgery - Holding home Lasix 40 mg daily, daily weights and strict I&O's - Holding anticoagulation as mentioned above  Elbow Injury S/P Fall: Elbow pain following fall prior to presentation.  No signs of  fracture on plain films.  - RICE therapy    Hx CVA with balance deficit: Chronic.  Stable. Cerebellar stroke 2007.  On ASA and coumadin at home.  CT head negative for acute abnormality.  Functioning well and even drove a car this week.  Wife notes patient has been using a walker recently. - PT/OT consulted, appreciate recommendations with plans for home health at discharge  Chronic Constipation: Chronic.  Stable.  On colace and miralax at home prn. - We will resume diet following surgery  BPH: On alfuzosin and finasteride at home.   - Continue home finasteride 5 mg daily, hold home alfuzosin 10 mg daily  FEN/GI: N.p.o. with ice chips pending surgery PPx: SCDs pending surgery  Disposition: pending surgical intervention for appendicitis. PT/OT rec HH at d/c for now.  Subjective:  Patient continues to have RLQ pain and some diarrhea without nausea or vomiting.  He does report some nonproductive coughing overnight and is requesting a breathing treatment.  He otherwise denies shortness of breath or chest pain.  Patient is ready for his surgery later today.  Objective: Temp:  [97.8 F (36.6 C)-98.4 F (36.9 C)] 98.4 F (36.9 C) (01/03 2340) Pulse Rate:  [74-90] 90 (01/03 2340) Resp:  [16-18] 18 (01/03 2340) BP: (101-119)/(52-58) 119/52 (01/03 2340) SpO2:  [90 %-100 %] 90 % (01/03 2340) Weight:  [82.5 kg] 82.5 kg (01/04 0500)  Physical Exam: General: frail elderly male lying in bed, NAD with non-toxic appearance Cardiovascular: irregularly irregular without murmurs, rubs, or gallops Lungs: clear to auscultation bilaterally with normal work of breathing Abdomen: soft, RLQ tenderness to palpation, non-distended, normoactive bowel sounds Skin: warm, dry, no rashes or lesions, cap refill < 2 seconds Extremities: warm and well perfused, normal  tone, no edema   Laboratory: Recent Labs  Lab 06/20/18 0856 06/21/18 0308 06/22/18 0208  WBC 5.0 7.8 7.9  HGB 13.0 10.4* 10.8*  HCT 41.4  32.5* 34.9*  PLT 127* 103* 100*   Recent Labs  Lab 06/20/18 0856 06/21/18 0308 06/22/18 0208  NA 139 141 141  K 3.9 3.4* 3.0*  CL 103 111 113*  CO2 23 24 17*  BUN 14 11 11   CREATININE 1.02 0.99 0.94  CALCIUM 8.8* 7.7* 8.3*  PROT 6.5  --   --   BILITOT 1.1  --   --   ALKPHOS 40  --   --   ALT 12  --   --   AST 29  --   --   GLUCOSE 103* 94 85    Imaging/Diagnostic Tests: CT ABDOMEN AND PELVIS WITH CONTRAST (06/20/2018) IMPRESSION: Continued mild inflammation in the right lower quadrant adjacent to the appendix which is mildly dilated at 8 mm compared with 11 mm previously. Very small focal fluid collection adjacent to the tip of the appendix measures maximally 1.3 cm. This is too small to drain. Aortic atherosclerosis. Prostate prominence with left posterior bladder wall diverticulum, Stable.  CT HEAD WITHOUT CONTRAST (06/20/2018) IMPRESSION: Mild diffuse cortical atrophy. Mild chronic ischemic white matter disease. No acute intracranial abnormality seen.    Wayne [place] Bing, DO 06/22/2018, 7:35 AM PGY-3, [place] Intern pager: [phone], text pages welcome

## 2018-06-22 NOTE — Progress Notes (Signed)
ANTICOAGULATION CONSULT NOTE - Initial Consult  Pharmacy Consult for subQ heparin Indication: VTE prophylaxis  Allergies  Allergen Reactions  . Penicillins Rash and Other (See Comments)    PATIENT HAS HAD A PCN REACTION WITH IMMEDIATE RASH, FACIAL/TONGUE/THROAT SWELLING, SOB, OR LIGHTHEADEDNESS WITH HYPOTENSION:  #  #  #  YES  #  #  #   Has patient had a PCN reaction causing severe rash involving mucus membranes or skin necrosis: No Has patient had a PCN reaction that required hospitalization No Has patient had a PCN reaction occurring within the last 10 years: No If all of the above answers are "NO", then may proceed with Cephalosporin use.  . Prednisone Other (See Comments)    Wheezing  . Procaine Hcl Other (See Comments)    Passed out after being given this at dental appointment  . Levofloxacin Rash  . Oxycodone Other (See Comments)    constipation    Patient Measurements: Height: 5\' 10"  (177.8 cm) Weight: 181 lb 14.1 oz (82.5 kg) IBW/kg (Calculated) : 73  Vital Signs: Temp: 97.9 F (36.6 C) (01/04 1518) Temp Source: Oral (01/04 1518) BP: 136/80 (01/04 1518) Pulse Rate: 127 (01/04 1518)  Labs: Recent Labs    06/20/18 0856 06/21/18 0308 06/22/18 0208 06/22/18 0858  HGB 13.0 10.4* 10.8*  --   HCT 41.4 32.5* 34.9*  --   PLT 127* 103* 100*  --   LABPROT 23.6* 25.0* 22.8* 20.1*  INR 2.14 2.31 2.04 1.74  CREATININE 1.02 0.99 0.94  --     Estimated Creatinine Clearance: 58.2 mL/min (by C-G formula based on SCr of 0.94 mg/dL).   Medical History: Past Medical History:  Diagnosis Date  . Anxiety    "maybe; having trouble at night" (04/24/2018)  . Aortic stenosis   . Arthritis   . Atrial fibrillation, chronic   . BPH (benign prostatic hyperplasia)   . CAD (coronary artery disease)   . Chronic anticoagulation   . Heart murmur   . Hepatitis 1960s   "drank contaminated water" (04/23/2018)  . History of chicken pox   . HTN (hypertension)    "have never had high  blood pressure" (04/23/2018)  . Mitral stenosis   . Old MI (myocardial infarction) 2007   s/p PCI to distal OM (no stent)  . Pneumonia    "1 time" (04/23/2018)  . Severe mitral regurgitation   . Stroke, embolic ([place])    8657-8 weeks after his heart attack; "weak all over since" (04/23/2018)    Medications:  Medications Prior to Admission  Medication Sig Dispense Refill Last Dose  . acetaminophen (TYLENOL) 500 MG tablet Take 500 mg by mouth every 6 (six) hours as needed for moderate pain or headache.    06/19/2018 at Unknown time  . alfuzosin (UROXATRAL) 10 MG 24 hr tablet Take 10 mg by mouth daily with breakfast.   06/19/2018 at Unknown time  . ARTIFICIAL TEAR OP Place 1 drop into both eyes daily as needed (dry eyes).   Past Week at Unknown time  . aspirin EC 81 MG tablet Take 81 mg by mouth daily.   06/19/2018 at Unknown time  . digoxin (LANOXIN) 0.125 MG tablet Take 1 tablet (125 mcg total) by mouth daily. 90 tablet 3 06/19/2018 at Unknown time  . docusate sodium (COLACE) 100 MG capsule Take 100 mg by mouth every evening.   06/19/2018 at Unknown time  . finasteride (PROSCAR) 5 MG tablet Take 5 mg by mouth at bedtime.  06/19/2018 at Unknown time  . furosemide (LASIX) 20 MG tablet Take 2 tablets (40 mg total) by mouth daily. IS DUE FOR AN OFFICE VISIT FOR FUTURE REFILLS ./CY (Patient taking differently: Take 40 mg by mouth daily. ) 60 tablet 2 06/19/2018 at Unknown time  . Melatonin 10 MG TABS Take 10 mg by mouth at bedtime.    06/19/2018 at Unknown time  . Menthol, Topical Analgesic, (BLUE-EMU MAXIMUM STRENGTH EX) Apply 1 application topically 3 (three) times daily as needed (muscle pain in back). alterrnates between [employer] and Blu Emu   Past Week at Unknown time  . metoprolol tartrate (LOPRESSOR) 25 MG tablet TAKE ONE TABLET (25mg ) BY MOUTH IN THE MORNING, THEN TAKE ONE-HALF (12.5mg ) IN THE EVENING (Patient taking differently: Take 12.5-25 mg by mouth See admin instructions. TAKE ONE TABLET (25mg ) BY  MOUTH IN THE MORNING, THEN TAKE ONE-HALF (12.5mg ) IN THE EVENING) 135 tablet 3 06/19/2018 at Unknown time  . polyethylene glycol (MIRALAX / GLYCOLAX) packet Take 17 g by mouth 2 (two) times daily. (Patient taking differently: Take 17 g by mouth daily as needed for mild constipation. )   06/19/2018 at Unknown time  . potassium chloride (K-DUR) 10 MEQ tablet Take 5 mEq by mouth daily as needed for fluid. TAKE POTASSIUM IF YOU TAKE LASIX  3 06/19/2018 at Unknown time  . sodium chloride (OCEAN) 0.65 % SOLN nasal spray Place 1 spray into both nostrils daily as needed for congestion.   unknown at as needed  . triamcinolone cream (KENALOG) 0.1 % Apply 1 application topically daily as needed (dry skin).    Past Week at Unknown time  . warfarin (COUMADIN) 2.5 MG tablet TAKE 1 TO 2 TABLETS BY MOUTH ONCE DAILY AS  DIRECTED  BY  COUMADIN  CLINIC (Patient taking differently: Take 2.5-5 mg by mouth See admin instructions. Take 2.5mg  on Tuesdays and Fridays and all other days take 5mg ) 180 tablet 1 06/19/2018 at 6-7pm  . cholecalciferol 2000 units TABS Take 1 tablet (2,000 Units total) by mouth daily. (Patient not taking: Reported on 06/20/2018) 30 tablet 0 Not Taking at Unknown time  . enoxaparin (LOVENOX) 80 MG/0.8ML injection Inject 0.8 mLs (80 mg total) into the skin every 12 (twelve) hours for 7 days. Stop taking when patient's INR is therapeutic (2-3) x 2. (Patient not taking: Reported on 06/20/2018) 22.4 Syringe 0 Not Taking at Unknown time  . furosemide (LASIX) 40 MG tablet Take 0.5 tablets (20 mg total) by mouth 2 (two) times daily as needed for fluid. (Patient not taking: Reported on 06/20/2018)   Not Taking at Unknown time  . metoprolol tartrate (LOPRESSOR) 25 MG tablet Take 0.5 tablets (12.5 mg total) by mouth 2 (two) times daily. (Patient not taking: Reported on 06/20/2018) 30 tablet 6 Not Taking at Unknown time  . potassium chloride (K-DUR,KLOR-CON) 10 MEQ tablet Take 1 tablet (10 mEq total) by mouth daily. (Patient not  taking: Reported on 06/20/2018) 30 tablet 3 Not Taking at Unknown time  . senna (SENOKOT) 8.6 MG TABS tablet Take 2 tablets (17.2 mg total) by mouth daily. (Patient not taking: Reported on 06/20/2018)   Not Taking at Unknown time    Assessment: 34 YOM s/p laparoscopic appendectomy to start subQ heparin for VTE prophylaxis. Surgery okay with starting tonight at 2300. Of note patient has a TAVR.   Goal of Therapy:  VTE prophylaxis Monitor platelets by anticoagulation protocol: Yes   Plan:  -SQ heparin 5000 units q 8 hours  Albertina Parr, PharmD., BCPS Clinical Pharmacist Clinical phone for 06/22/18 until 8:30pm: [phone] If after 8:30pm, please refer to [facility] for unit-specific pharmacist

## 2018-06-22 NOTE — Progress Notes (Signed)
RN verified the presence of a signed informed consent that matches stated procedure by patient. Verified armband matches patient's stated name and birth date. Verified NPO status and that all jewelry, contact, glasses, dentures, and partials had been removed (if applicable).  

## 2018-06-22 NOTE — Progress Notes (Signed)
Patient suddenly complaining of cramping pain in his bilateral shins. All pulses palpable 2+ and no discoloration. Mild swelling noted bilaterally. Skin is dewy from previous lotion application. He has no calf pain with or without flexion. Lungs are clear and he denies any chest pain or shortness of breath. Tylenol given and FMTS paged. OR awaiting their assessment.

## 2018-06-22 NOTE — Anesthesia Procedure Notes (Signed)
Procedure Name: Intubation Date/Time: 06/22/2018 11:14 AM Performed by: Orlie Dakin, CRNA Pre-anesthesia Checklist: Patient identified, Emergency Drugs available, Suction available and Patient being monitored Patient Re-evaluated:Patient Re-evaluated prior to induction Oxygen Delivery Method: Circle system utilized Preoxygenation: Pre-oxygenation with 100% oxygen Induction Type: IV induction and Cricoid Pressure applied Ventilation: Mask ventilation without difficulty Laryngoscope Size: Miller and 3 Grade View: Grade I Tube type: Oral Tube size: 7.5 mm Number of attempts: 1 Airway Equipment and Method: Stylet Placement Confirmation: ETT inserted through vocal cords under direct vision,  positive ETCO2 and breath sounds checked- equal and bilateral Secured at: 23 cm Tube secured with: Tape Dental Injury: Teeth and Oropharynx as per pre-operative assessment  Comments: 4x4s bite block used.

## 2018-06-22 NOTE — Anesthesia Postprocedure Evaluation (Signed)
Anesthesia Post Note  Patient: Wayne [name]  Procedure(s) Performed: APPENDECTOMY LAPAROSCOPIC (N/A Abdomen)     Patient location during evaluation: PACU Anesthesia Type: General Level of consciousness: sedated, patient cooperative and oriented Pain management: pain level controlled Vital Signs Assessment: post-procedure vital signs reviewed and stable Respiratory status: spontaneous breathing, nonlabored ventilation, respiratory function stable and patient connected to nasal cannula oxygen Cardiovascular status: blood pressure returned to baseline and stable Postop Assessment: no apparent nausea or vomiting Anesthetic complications: no    Last Vitals:  Vitals:   06/22/18 1215 06/22/18 1230  BP: 133/77 129/76  Pulse: (!) 111 (!) 129  Resp: (!) 25 20  Temp:    SpO2: 97% 100%    Last Pain:  Vitals:   06/22/18 1205  TempSrc:   PainSc: 0-No pain                 Sladen Plancarte,E. Jennea Rager

## 2018-06-22 NOTE — Progress Notes (Signed)
Subjective/Chief Complaint: RLQ pain, requests breathing treatment   Objective: Vital signs in last 24 hours: Temp:  [97.8 F (36.6 C)-98.4 F (36.9 C)] 98.4 F (36.9 C) (01/03 2340) Pulse Rate:  [74-90] 90 (01/03 2340) Resp:  [16-18] 18 (01/03 2340) BP: (101-119)/(52-58) 119/52 (01/03 2340) SpO2:  [90 %-100 %] 90 % (01/03 2340) Weight:  [82.5 kg] 82.5 kg (01/04 0500) Last BM Date: 06/21/17  Intake/Output from previous day: 01/03 0701 - 01/04 0700 In: 709.2 [I.V.:409.2; IV Piggyback:300] Out: 450 [Urine:450] Intake/Output this shift: No intake/output data recorded.  General appearance: cooperative Resp: few rales Cardio: irr GI: tender RLQ  Lab Results:  Recent Labs    06/21/18 0308 06/22/18 0208  WBC 7.8 7.9  HGB 10.4* 10.8*  HCT 32.5* 34.9*  PLT 103* 100*   BMET Recent Labs    06/21/18 0308 06/22/18 0208  NA 141 141  K 3.4* 3.0*  CL 111 113*  CO2 24 17*  GLUCOSE 94 85  BUN 11 11  CREATININE 0.99 0.94  CALCIUM 7.7* 8.3*   PT/INR Recent Labs    06/21/18 0308 06/22/18 0208  LABPROT 25.0* 22.8*  INR 2.31 2.04   ABG No results for input(s): PHART, HCO3 in the last 72 hours.  Invalid input(s): PCO2, PO2  Studies/Results: Dg Elbow 2 Views Left  Result Date: 06/20/2018 CLINICAL DATA:  Status post fall, altered mental status EXAM: LEFT ELBOW - 2 VIEW COMPARISON:  None. FINDINGS: Subtle lucency along the articular surface of the left radial head which may be projectional versus a subtle nondisplaced fracture. No other fracture or dislocation. Minimal osteoarthritis of the ulnohumeral joint. Soft tissues are unremarkable. IMPRESSION: Subtle lucency along the articular surface of the left radial head which may be projectional versus a subtle nondisplaced fracture. Recommend oblique views of the elbow for further evaluation. Electronically Signed   By: Kathreen Devoid   On: 06/20/2018 09:48   Dg Elbow Complete Left (3+view)  Result Date:  06/20/2018 CLINICAL DATA:  Fall with left elbow pain. EXAM: LEFT ELBOW - COMPLETE 3+ VIEW COMPARISON:  Elbow radiographs obtained earlier today at 9:29 a.m. FINDINGS: No fracture. Specifically, there is no evidence of a radial head fracture. No bone lesion. Elbow joint is normally spaced and aligned. No joint effusion. Soft tissues are unremarkable. IMPRESSION: Negative. Electronically Signed   By: Lajean Manes M.D.   On: 06/20/2018 18:25   Ct Head Wo Contrast  Result Date: 06/20/2018 CLINICAL DATA:  Head trauma. EXAM: CT HEAD WITHOUT CONTRAST TECHNIQUE: Contiguous axial images were obtained from the base of the skull through the vertex without intravenous contrast. COMPARISON:  CT scan of January 21, 2018. FINDINGS: Brain: Mild diffuse cortical atrophy is noted. Mild chronic ischemic white matter disease is noted. No mass effect or midline shift is noted. Ventricular size is within normal limits. There is no evidence of mass lesion, hemorrhage or acute infarction. Vascular: No hyperdense vessel or unexpected calcification. Skull: Normal. Negative for fracture or focal lesion. Sinuses/Orbits: No acute finding. Other: None. IMPRESSION: Mild diffuse cortical atrophy. Mild chronic ischemic white matter disease. No acute intracranial abnormality seen. Electronically Signed   By: Marijo Conception, M.D.   On: 06/20/2018 11:34   Ct Abdomen Pelvis W Contrast  Result Date: 06/20/2018 CLINICAL DATA:  Generalized abdominal pain with fever EXAM: CT ABDOMEN AND PELVIS WITH CONTRAST TECHNIQUE: Multidetector CT imaging of the abdomen and pelvis was performed using the standard protocol following bolus administration of intravenous contrast. CONTRAST:  196mL  OMNIPAQUE IOHEXOL 300 MG/ML  SOLN COMPARISON:  06/21/2017 FINDINGS: Lower chest: Cardiomegaly.  Dependent atelectasis.  No effusions. Hepatobiliary: No focal hepatic abnormality. Gallbladder unremarkable. Pancreas: No focal abnormality or ductal dilatation. Spleen: No focal  abnormality.  Normal size. Adrenals/Urinary Tract: No hydronephrosis. Small cyst in the midpole of the left kidney. Adrenal glands unremarkable. Left posterior bladder wall diverticulum measures 4 cm, stable. Stomach/Bowel: Appendix remains borderline dilated at 8 mm. There is a small fluid collection adjacent to the tip of the appendix in the area of prior phlegmonous change, measuring maximally 1.3 cm. Mild surrounding inflammation in the right lower quadrant. No evidence of bowel obstruction. Few scattered sigmoid diverticula. Vascular/Lymphatic: Aortic atherosclerosis. No enlarged abdominal or pelvic lymph nodes. Reproductive: Mildly prominent prostate with central calcifications. Other: No free fluid or free air. Musculoskeletal: No acute bony abnormality. IMPRESSION: Continued mild inflammation in the right lower quadrant adjacent to the appendix which is mildly dilated at 8 mm compared with 11 mm previously. Very small focal fluid collection adjacent to the tip of the appendix measures maximally 1.3 cm. This is too small to drain. Aortic atherosclerosis. Prostate prominence with left posterior bladder wall diverticulum, stable. Electronically Signed   By: Rolm Baptise M.D.   On: 06/20/2018 11:34   Dg Chest Port 1 View  Result Date: 06/20/2018 CLINICAL DATA:  Pain following fall EXAM: PORTABLE CHEST 1 VIEW COMPARISON:  April 23, 2018 FINDINGS: There is no edema or consolidation. Heart is slightly enlarged, stable, with pulmonary vascularity normal. Patient is status post aortic valve replacement. There is aortic atherosclerosis. No adenopathy evident. There is degenerative change in each shoulder. IMPRESSION: No edema or consolidation. Mild cardiomegaly, stable. Status post aortic valve replacement. There is aortic atherosclerosis. Aortic Atherosclerosis (ICD10-I70.0). Electronically Signed   By: Lowella Grip III M.D.   On: 06/20/2018 09:46    Anti-infectives: Anti-infectives (From admission,  onward)   Start     Dose/Rate Route Frequency Ordered Stop   06/22/18 1000  vancomycin (VANCOCIN) 1,500 mg in sodium chloride 0.9 % 500 mL IVPB     1,500 mg 250 mL/hr over 120 Minutes Intravenous Every 24 hours 06/21/18 2201     06/21/18 1100  vancomycin (VANCOCIN) 1,500 mg in sodium chloride 0.9 % 500 mL IVPB  Status:  Discontinued     1,500 mg 250 mL/hr over 120 Minutes Intravenous Every 24 hours 06/20/18 1013 06/21/18 0818   06/21/18 1100  vancomycin (VANCOCIN) 1,250 mg in sodium chloride 0.9 % 250 mL IVPB  Status:  Discontinued     1,250 mg 166.7 mL/hr over 90 Minutes Intravenous Every 24 hours 06/21/18 0818 06/21/18 2201   06/20/18 2200  ceFEPIme (MAXIPIME) 2 g in sodium chloride 0.9 % 100 mL IVPB     2 g 200 mL/hr over 30 Minutes Intravenous Every 12 hours 06/20/18 1014     06/20/18 0915  aztreonam (AZACTAM) 2 g in sodium chloride 0.9 % 100 mL IVPB  Status:  Discontinued     2 g 200 mL/hr over 30 Minutes Intravenous  Once 06/20/18 0905 06/20/18 0909   06/20/18 0915  metroNIDAZOLE (FLAGYL) IVPB 500 mg     500 mg 100 mL/hr over 60 Minutes Intravenous Every 8 hours 06/20/18 0905     06/20/18 0915  vancomycin (VANCOCIN) IVPB 1000 mg/200 mL premix  Status:  Discontinued     1,000 mg 200 mL/hr over 60 Minutes Intravenous  Once 06/20/18 0905 06/20/18 0909   06/20/18 0915  ceFEPIme (MAXIPIME) 2 g in  sodium chloride 0.9 % 100 mL IVPB     2 g 200 mL/hr over 30 Minutes Intravenous  Once 06/20/18 0909 06/20/18 1024   06/20/18 0915  vancomycin (VANCOCIN) 1,500 mg in sodium chloride 0.9 % 500 mL IVPB     1,500 mg 250 mL/hr over 120 Minutes Intravenous  Once 06/20/18 0909 06/20/18 1316      Assessment/Plan: Appendicitis - vitamin K now then repeat INR. Plan lap appendectomy later this AM. I discussed the procedure, risks, and benefits with him.   Duoneb x1 now  LOS: 2 days    Zenovia Jarred 06/22/2018

## 2018-06-22 NOTE — Progress Notes (Signed)
Called to patient room by RN due to patient experiencing new onset bilateral shin pain.  Patient says pain is localized to his shins below his knees.  He denies shortness of breath, chest pain, calf pain.  Well-appearing on exam and comfortable in bed without signs of acute distress.  Given Tylenol and okay to proceed to OR for scheduled laparoscopic appendectomy.  Harriet [place], [place], PGY-3

## 2018-06-23 ENCOUNTER — Encounter (HOSPITAL_COMMUNITY): Payer: Self-pay | Admitting: General Surgery

## 2018-06-23 DIAGNOSIS — I10 Essential (primary) hypertension: Secondary | ICD-10-CM

## 2018-06-23 DIAGNOSIS — I482 Chronic atrial fibrillation, unspecified: Secondary | ICD-10-CM

## 2018-06-23 LAB — PROTIME-INR
INR: 1.48
Prothrombin Time: 17.8 seconds — ABNORMAL HIGH (ref 11.4–15.2)

## 2018-06-23 LAB — CBC
HCT: 35.6 % — ABNORMAL LOW (ref 39.0–52.0)
Hemoglobin: 11.4 g/dL — ABNORMAL LOW (ref 13.0–17.0)
MCH: 30.1 pg (ref 26.0–34.0)
MCHC: 32 g/dL (ref 30.0–36.0)
MCV: 93.9 fL (ref 80.0–100.0)
NRBC: 0 % (ref 0.0–0.2)
Platelets: 123 10*3/uL — ABNORMAL LOW (ref 150–400)
RBC: 3.79 MIL/uL — ABNORMAL LOW (ref 4.22–5.81)
RDW: 16.2 % — ABNORMAL HIGH (ref 11.5–15.5)
WBC: 8 10*3/uL (ref 4.0–10.5)

## 2018-06-23 LAB — URINE CULTURE: Culture: 30000 — AB

## 2018-06-23 LAB — COMPREHENSIVE METABOLIC PANEL
ALT: 16 U/L (ref 0–44)
AST: 30 U/L (ref 15–41)
Albumin: 2.8 g/dL — ABNORMAL LOW (ref 3.5–5.0)
Alkaline Phosphatase: 33 U/L — ABNORMAL LOW (ref 38–126)
Anion gap: 7 (ref 5–15)
BUN: 17 mg/dL (ref 8–23)
CO2: 18 mmol/L — ABNORMAL LOW (ref 22–32)
Calcium: 8.3 mg/dL — ABNORMAL LOW (ref 8.9–10.3)
Chloride: 115 mmol/L — ABNORMAL HIGH (ref 98–111)
Creatinine, Ser: 0.96 mg/dL (ref 0.61–1.24)
GFR calc Af Amer: >60 mL/min (ref >60)
GFR calc non Af Amer: >60 mL/min (ref >60)
Glucose, Bld: 178 mg/dL — ABNORMAL HIGH (ref 70–99)
POTASSIUM: 4 mmol/L (ref 3.5–5.1)
SODIUM: 140 mmol/L (ref 135–145)
Total Bilirubin: 0.3 mg/dL (ref 0.3–1.2)
Total Protein: 5.9 g/dL — ABNORMAL LOW (ref 6.5–8.1)

## 2018-06-23 LAB — HEPARIN LEVEL (UNFRACTIONATED): Heparin Unfractionated: 0.52 IU/mL (ref 0.30–0.70)

## 2018-06-23 MED ORDER — ACETAMINOPHEN 325 MG PO TABS: 650.0000 mg | ORAL_TABLET | ORAL | Status: DC | PRN

## 2018-06-23 MED ORDER — WARFARIN - PHARMACIST DOSING INPATIENT
Freq: Every day | Status: DC
  Administered 2018-06-23 – 2018-06-27 (×5)

## 2018-06-23 MED ORDER — HEPARIN (PORCINE) 25000 UT/250ML-% IV SOLN
1200.0000 [IU]/h | INTRAVENOUS | Status: DC
  Administered 2018-06-23 – 2018-06-26 (×5): 1200 [IU]/h via INTRAVENOUS
  Filled 2018-06-23 (×5): qty 250

## 2018-06-23 MED ORDER — HEPARIN BOLUS VIA INFUSION
3000.0000 [IU] | Freq: Once | INTRAVENOUS | Status: AC
  Administered 2018-06-23: 3000 [IU] via INTRAVENOUS
  Filled 2018-06-23: qty 3000

## 2018-06-23 MED ORDER — TRAMADOL HCL 50 MG PO TABS
50.0000 mg | ORAL_TABLET | Freq: Four times a day (QID) | ORAL | Status: DC | PRN
  Administered 2018-06-23 – 2018-06-24 (×2): 50 mg via ORAL
  Filled 2018-06-23 (×2): qty 1

## 2018-06-23 MED ORDER — WARFARIN SODIUM 7.5 MG PO TABS
7.5000 mg | ORAL_TABLET | Freq: Once | ORAL | Status: AC
  Administered 2018-06-23: 7.5 mg via ORAL
  Filled 2018-06-23: qty 1

## 2018-06-23 NOTE — Progress Notes (Signed)
[place] for Heparin / Warfarin Indication: atrial fibrillation  Allergies  Allergen Reactions  . Penicillins Rash and Other (See Comments)    PATIENT HAS HAD A PCN REACTION WITH IMMEDIATE RASH, FACIAL/TONGUE/THROAT SWELLING, SOB, OR LIGHTHEADEDNESS WITH HYPOTENSION:  #  #  #  YES  #  #  #   Has patient had a PCN reaction causing severe rash involving mucus membranes or skin necrosis: No Has patient had a PCN reaction that required hospitalization No Has patient had a PCN reaction occurring within the last 10 years: No If all of the above answers are "NO", then may proceed with Cephalosporin use.  . Prednisone Other (See Comments)    Wheezing  . Procaine Hcl Other (See Comments)    Passed out after being given this at dental appointment  . Levofloxacin Rash  . Oxycodone Other (See Comments)    constipation    Patient Measurements: Height: 5\' 10"  (177.8 cm) Weight: 181 lb 14.1 oz (82.5 kg) IBW/kg (Calculated) : 73 Heparin Dosing Weight: 83 kg  Vital Signs: Temp: 97.9 F (36.6 C) (01/05 0740) Temp Source: Oral (01/05 0740) BP: 121/74 (01/05 0740) Pulse Rate: 82 (01/05 0740)  Labs: Recent Labs    06/21/18 0308 06/22/18 0208 06/22/18 0858 06/23/18 0233 06/23/18 1653  HGB 10.4* 10.8*  --  11.4*  --   HCT 32.5* 34.9*  --  35.6*  --   PLT 103* 100*  --  123*  --   LABPROT 25.0* 22.8* 20.1* 17.8*  --   INR 2.31 2.04 1.74 1.48  --   HEPARINUNFRC  --   --   --   --  0.52  CREATININE 0.99 0.94  --  0.96  --     Estimated Creatinine Clearance: 57 mL/min (by C-G formula based on SCr of 0.96 mg/dL).   Assessment: 5 YOM s/p laparoscopic appendectomy on Coumadin PTA for afib. Patient also has history of TAVR. Pharmacy has been consulted to start heparin and warfarin.  Heparin level therapeutic this PM   Goal of Therapy:  Heparin level 0.3-0.7 units/ml Monitor platelets by anticoagulation protocol: Yes  INR = 2 to 3   Plan:   Continue heparin at 1200 units / hr Warfarin 7.5 mg po x 1 dose at 1800 pm Daily INR, CBC, heparin level  Thank you Anette Guarneri, PharmD [phone] 06/23/2018     5:43 PM

## 2018-06-23 NOTE — Progress Notes (Signed)
ANTICOAGULATION CONSULT NOTE - Initial Consult  Pharmacy Consult for Heparin Indication: atrial fibrillation  Allergies  Allergen Reactions  . Penicillins Rash and Other (See Comments)    PATIENT HAS HAD A PCN REACTION WITH IMMEDIATE RASH, FACIAL/TONGUE/THROAT SWELLING, SOB, OR LIGHTHEADEDNESS WITH HYPOTENSION:  #  #  #  YES  #  #  #   Has patient had a PCN reaction causing severe rash involving mucus membranes or skin necrosis: No Has patient had a PCN reaction that required hospitalization No Has patient had a PCN reaction occurring within the last 10 years: No If all of the above answers are "NO", then may proceed with Cephalosporin use.  . Prednisone Other (See Comments)    Wheezing  . Procaine Hcl Other (See Comments)    Passed out after being given this at dental appointment  . Levofloxacin Rash  . Oxycodone Other (See Comments)    constipation    Patient Measurements: Height: 5\' 10"  (177.8 cm) Weight: 181 lb 14.1 oz (82.5 kg) IBW/kg (Calculated) : 73 Heparin Dosing Weight: 83 kg  Vital Signs: Temp: 97.9 F (36.6 C) (01/05 0740) Temp Source: Oral (01/05 0740) BP: 121/74 (01/05 0740) Pulse Rate: 82 (01/05 0740)  Labs: Recent Labs    06/21/18 0308 06/22/18 0208 06/22/18 0858 06/23/18 0233  HGB 10.4* 10.8*  --  11.4*  HCT 32.5* 34.9*  --  35.6*  PLT 103* 100*  --  123*  LABPROT 25.0* 22.8* 20.1* 17.8*  INR 2.31 2.04 1.74 1.48  CREATININE 0.99 0.94  --  0.96    Estimated Creatinine Clearance: 57 mL/min (by C-G formula based on SCr of 0.96 mg/dL).   Medical History: Past Medical History:  Diagnosis Date  . Anxiety    "maybe; having trouble at night" (04/24/2018)  . Aortic stenosis   . Arthritis   . Atrial fibrillation, chronic   . BPH (benign prostatic hyperplasia)   . CAD (coronary artery disease)   . Chronic anticoagulation   . Heart murmur   . Hepatitis 1960s   "drank contaminated water" (04/23/2018)  . History of chicken pox   . HTN  (hypertension)    "have never had high blood pressure" (04/23/2018)  . Mitral stenosis   . Old MI (myocardial infarction) 2007   s/p PCI to distal OM (no stent)  . Pneumonia    "1 time" (04/23/2018)  . Severe mitral regurgitation   . Stroke, embolic ([place])    0354-6 weeks after his heart attack; "weak all over since" (04/23/2018)    Medications:  Scheduled:  . digoxin  125 mcg Oral Daily  . finasteride  5 mg Oral QHS  . Melatonin  9 mg Oral QHS  . metoprolol tartrate  12.5 mg Oral BID  . polyethylene glycol  17 g Oral Daily  . potassium chloride  40 mEq Oral BID    Assessment: 73 YOM s/p laparoscopic appendectomy on Coumadin PTA for afib. Patient also has history of TAVR. Pharmacy has been consulted to start heparin. Coumadin still on hold. CBC is within normal limits.  Goal of Therapy:  Heparin level 0.3-0.7 units/ml Monitor platelets by anticoagulation protocol: Yes   Plan:  Give 3000 units bolus x 1 Start heparin infusion at 1200 units/hr Check anti-Xa level in 8 hours and daily while on heparin Continue to monitor H&H and platelets  Jackson Latino, PharmD PGY1 Pharmacy Resident Phone [phone] 06/23/2018     8:15 AM

## 2018-06-23 NOTE — Progress Notes (Signed)
Family Medicine Teaching Service Daily Progress Note Intern Pager: [phone]  Patient name: Wayne [name] Medical record number: [id number] Date of birth: 05-02-1930 Age: 85 y.o. Gender: male  Primary Care Provider: [name], DO Consultants: Cardiology, General Surgery Code Status: DNR  Pt Overview and Major Events to Date:  1/2: Admitted for sepsis d/t appendicitis 1/3: GenSurg plans Sx 1/4 1/4: Laproscopic appendectomy  Assessment and Plan: Wayne [name] is a 85 y.o. male presenting with weakness and fall. PMH is significant for A. fib on chronic anticoagulation, aortic valve replacement with porcine valve, CAD, CVD, BPH, MI s/p PCI, CVA, HFpEF, and hypertension.  Sepsis secondary to acute appendicitis: 1 day S/P Appendectomy On vanc/cefepime/flagyl, day 4 of Abx therapy.  Has remained afebrile.  Desaturation noted to 89% and placed on 4L per [place] overnight.  Blood Cx NG3D.  Urine Cx 30,000 Enterococcus faecalis. WBC 8, Hgb stable at 11.4 post-surgery.  Patient is tolerating his diet well and eating his breakfast this AM. Pain well-controlled on current regimen, no oxy IR since 1530 yesterday, was placed on tramadol this AM by surgery. O2 desat to 89 last PM in chart review and placed on 4L per O2.  Placed on 2L while on rounds, patient breathing comfortably. O2 sats appropriate. - General surgery consulted, appreciate recommendations - Continue vancomycin+cefepime+Flagyl, plan to de-escalate as able, per surgery needs 7 days Abx post op through 1/11 - Telemetry - regular diet - pain control: tylenol prn, tramadol 50mg  q6h prn - wean O2 as tolerated, place on continuous pulse ox - d/c foley - [facility]   Atrial fibrillation  HFpEF: Chronic.   History of TAVR in March 2019.  On Coumadin with INR goal of 2-3.  Heparin subQ started yesterday per pharmacy, needs to be on IV heparin.  INR today 1.48.  Metoprolol restarted yesterday about 2200.  HR 80s since that  time. - Telemetry - Continue home digoxin - cont home lopressor 12.5mg  BID - Holding home Lasix 40 mg daily, daily weights and strict I&O's - Holding home ASA - plan to restart warfarin this PM - start heparin IV per pharmacy, will need to bridge with warfarin  Elbow Injury S/P Fall: Elbow pain following fall prior to presentation.  No signs of fracture on plain films.  - RICE therapy    Hx CVA with balance deficit: Chronic, stable.  Cerebellar stroke 2007.  On ASA and coumadin at home.  CT head negative for acute abnormality.  Functioning well and even drove a car this week.  Wife notes patient has been using a walker recently. - PT/OT consulted, appreciate recommendations with plans for home health at discharge  Chronic Constipation: Chronic, stable.   On colace and miralax at home prn. - We will resume diet following surgery  BPH: On alfuzosin and finasteride at home.   - Continue home finasteride 5 mg daily, hold home alfuzosin 10 mg daily  FEN/GI: Heart healthy diet PPx: Heparin IV  Disposition: Home with HH following surgery clearance for d/c and transition to PO Abx  Subjective:  Patient states that he is feeling well this AM.  Pain is well-tolerated.  Had desat overnight and was placed on O2 but states that he has been breathing well since that time.  Eating breakfast this AM and has been tolerating it well.  Has not had BM, but has had gas.  Objective: Temp:  [97.3 F (36.3 C)-98.1 F (36.7 C)] 98.1 F (36.7 C) (01/04 2300) Pulse Rate:  [  92-130] 92 (01/04 2300) Resp:  [12-25] 18 (01/04 2300) BP: (96-147)/(51-97) 104/70 (01/04 2300) SpO2:  [89 %-100 %] 92 % (01/04 2305)  Physical Exam: General: 85 y.o. male in NAD, sitting up on edge of bed, eating breakfast Cardio: irregularly irregular Lungs: CTAB, no wheezing, no rhonchi, no crackles, no increased work of breathing, placed on 2L Abdomen: Soft, minimally TTP lower abdomen, positive bowel sounds Skin: warm and  dry Extremities: No edema    Laboratory: Recent Labs  Lab 06/21/18 0308 06/22/18 0208 06/23/18 0233  WBC 7.8 7.9 8.0  HGB 10.4* 10.8* 11.4*  HCT 32.5* 34.9* 35.6*  PLT 103* 100* 123*   Recent Labs  Lab 06/20/18 0856 06/21/18 0308 06/22/18 0208 06/23/18 0233  NA 139 141 141 140  K 3.9 3.4* 3.0* 4.0  CL 103 111 113* 115*  CO2 23 24 17* 18*  BUN 14 11 11 17   CREATININE 0.17 0.99 0.94 0.96  CALCIUM 8.8* 7.7* 8.3* 8.3*  PROT 6.5  --   --  5.9*  BILITOT 1.1  --   --  0.3  ALKPHOS 40  --   --  33*  ALT 12  --   --  16  AST 29  --   --  30  GLUCOSE 103* 94 85 178*    Imaging/Diagnostic Tests: CT ABDOMEN AND PELVIS WITH CONTRAST (06/20/2018) IMPRESSION: Continued mild inflammation in the right lower quadrant adjacent to the appendix which is mildly dilated at 8 mm compared with 11 mm previously. Very small focal fluid collection adjacent to the tip of the appendix measures maximally 1.3 cm. This is too small to drain. Aortic atherosclerosis. Prostate prominence with left posterior bladder wall diverticulum, Stable.  CT HEAD WITHOUT CONTRAST (06/20/2018) IMPRESSION: Mild diffuse cortical atrophy. Mild chronic ischemic white matter disease. No acute intracranial abnormality seen.    [place], DO 06/23/2018, 7:38 AM PGY-1, [place] Intern pager: [phone], text pages welcome

## 2018-06-23 NOTE — Progress Notes (Addendum)
1 Day Post-Op    ZO:[id number] pain  Subjective: Frail-appearing, lying in bed with his oxygen on.  Foley still in.  He did eat some lasagna last evening.  Has not eaten anything for breakfast.  He says he feels better.  Nursing starting heparin drip now.  Port sites all look fine.  Objective: Vital signs in last 24 hours: Temp:  [97.3 F (36.3 C)-98.1 F (36.7 C)] 97.9 F (36.6 C) (01/05 0740) Pulse Rate:  [82-130] 82 (01/05 0740) Resp:  [12-25] 20 (01/05 0740) BP: (96-147)/(51-97) 121/74 (01/05 0740) SpO2:  [89 %-100 %] 97 % (01/05 0740) Last BM Date: 06/22/18 240 PO 5000 IV Urine 500 recorded Afebrile, VSS Na 140 glucose 178 WBC 8.0 INR 1.48 Platelets 123K   Intake/Output from previous day: 01/04 0701 - 01/05 0700 In: 5361.2 [P.O.:240; I.V.:3503.8; IV Piggyback:1167.4] Out: 500 [Urine:500] Intake/Output this shift: No intake/output data recorded.  General appearance: alert, cooperative, no distress and Very frail gentleman. Resp: Few rales at the bases. GI: Sore, port sites all look fine.  Lab Results:  Recent Labs    06/22/18 0208 06/23/18 0233  WBC 7.9 8.0  HGB 10.8* 11.4*  HCT 34.9* 35.6*  PLT 100* 123*    BMET Recent Labs    06/22/18 0208 06/23/18 0233  NA 141 140  K 3.0* 4.0  CL 113* 115*  CO2 17* 18*  GLUCOSE 85 178*  BUN 11 17  CREATININE 0.94 0.96  CALCIUM 8.3* 8.3*   PT/INR Recent Labs    06/22/18 0858 06/23/18 0233  LABPROT 20.1* 17.8*  INR 1.74 1.48    Recent Labs  Lab 06/20/18 0856 06/23/18 0233  AST 29 30  ALT 12 16  ALKPHOS 40 33*  BILITOT 1.1 0.3  PROT 6.5 5.9*  ALBUMIN 3.8 2.8*     Lipase     Component Value Date/Time   LIPASE 30 06/20/2018 0856     Medications: . digoxin  125 mcg Oral Daily  . finasteride  5 mg Oral QHS  . heparin  3,000 Units Intravenous Once  . Melatonin  9 mg Oral QHS  . metoprolol tartrate  12.5 mg Oral BID  . polyethylene glycol  17 g Oral Daily  . potassium chloride  40 mEq  Oral BID   . sodium chloride 120 mL/hr at 06/22/18 1310  . ceFEPime (MAXIPIME) IV 2 g (06/22/18 2213)  . heparin    . metronidazole 500 mg (06/23/18 0026)  . vancomycin 1,500 mg (06/22/18 9811)   Assessment/Plan S/p TAVR CAD A fib on coumadin HTN H/o MI H/O CVA SIRS, unclear etiology Thrombocytopenia - 103K >> 123K today DNR  Recurrent Acute appendicitis Laparoscopic appendectomy 06/22/18, Dr. Georganna Skeans  FEN -IV fluids/Heart healty VTE -heparin SQ  - ok to restart coumadin  -  ID: Vancomycin x 1 06/20/17, Cefepime 1/2 >> day 4, flagyl 1/2 >> day 4 - Needs 7 days abx post op thru 06/29/18  Plan: DC Foley from our standpoint, but note says they are diuresising him so I will defer to Medicine.  Get OT and PT to start working with him.  He lives at home with his wife.  He normally uses a walker.  Medicine is restarted heparin we can restart the Coumadin this evening.  He needs 7 days of antibiotics postop.  I am going cut his IV down to 25 cc/h.  Tylenol/Tramadol for pain.  LOS: 3 days    Edom Schmuhl 06/23/2018 [phone]

## 2018-06-24 ENCOUNTER — Inpatient Hospital Stay (HOSPITAL_COMMUNITY): Payer: Medicare Other

## 2018-06-24 DIAGNOSIS — I5033 Acute on chronic diastolic (congestive) heart failure: Secondary | ICD-10-CM

## 2018-06-24 LAB — CBC
HCT: 33.4 % — ABNORMAL LOW (ref 39.0–52.0)
Hemoglobin: 10.6 g/dL — ABNORMAL LOW (ref 13.0–17.0)
MCH: 29.9 pg (ref 26.0–34.0)
MCHC: 31.7 g/dL (ref 30.0–36.0)
MCV: 94.1 fL (ref 80.0–100.0)
Platelets: 143 10*3/uL — ABNORMAL LOW (ref 150–400)
RBC: 3.55 MIL/uL — ABNORMAL LOW (ref 4.22–5.81)
RDW: 16.6 % — ABNORMAL HIGH (ref 11.5–15.5)
WBC: 10.6 10*3/uL — ABNORMAL HIGH (ref 4.0–10.5)
nRBC: 0 % (ref 0.0–0.2)

## 2018-06-24 LAB — BASIC METABOLIC PANEL
Anion gap: 6 (ref 5–15)
BUN: 21 mg/dL (ref 8–23)
CO2: 19 mmol/L — ABNORMAL LOW (ref 22–32)
Calcium: 8.3 mg/dL — ABNORMAL LOW (ref 8.9–10.3)
Chloride: 115 mmol/L — ABNORMAL HIGH (ref 98–111)
Creatinine, Ser: 0.84 mg/dL (ref 0.61–1.24)
GFR calc Af Amer: >60 mL/min (ref >60)
GFR calc non Af Amer: >60 mL/min (ref >60)
Glucose, Bld: 108 mg/dL — ABNORMAL HIGH (ref 70–99)
Potassium: 4.5 mmol/L (ref 3.5–5.1)
SODIUM: 140 mmol/L (ref 135–145)

## 2018-06-24 LAB — PROTIME-INR
INR: 1.35
Prothrombin Time: 16.5 seconds — ABNORMAL HIGH (ref 11.4–15.2)

## 2018-06-24 LAB — HEPARIN LEVEL (UNFRACTIONATED): Heparin Unfractionated: 0.5 IU/mL (ref 0.30–0.70)

## 2018-06-24 MED ORDER — DOCUSATE SODIUM 100 MG PO CAPS
100.0000 mg | ORAL_CAPSULE | Freq: Two times a day (BID) | ORAL | Status: DC
  Administered 2018-06-24 – 2018-06-28 (×6): 100 mg via ORAL
  Filled 2018-06-24 (×6): qty 1

## 2018-06-24 MED ORDER — FUROSEMIDE 40 MG PO TABS
40.0000 mg | ORAL_TABLET | Freq: Every day | ORAL | Status: DC
  Administered 2018-06-24: 40 mg via ORAL
  Filled 2018-06-24: qty 1

## 2018-06-24 MED ORDER — BISACODYL 10 MG RE SUPP: 10.0000 mg | Freq: Every day | RECTAL | Status: DC | PRN

## 2018-06-24 MED ORDER — BOOST / RESOURCE BREEZE PO LIQD CUSTOM
1.0000 | Freq: Two times a day (BID) | ORAL | Status: DC
  Administered 2018-06-24 – 2018-06-27 (×6): 1 via ORAL

## 2018-06-24 MED ORDER — WARFARIN SODIUM 7.5 MG PO TABS
7.5000 mg | ORAL_TABLET | Freq: Once | ORAL | Status: AC
  Administered 2018-06-24: 7.5 mg via ORAL
  Filled 2018-06-24: qty 1

## 2018-06-24 MED ORDER — ACETAMINOPHEN 325 MG PO TABS
650.0000 mg | ORAL_TABLET | Freq: Four times a day (QID) | ORAL | Status: DC
  Administered 2018-06-24 – 2018-06-28 (×15): 650 mg via ORAL
  Filled 2018-06-24 (×15): qty 2

## 2018-06-24 MED ORDER — IPRATROPIUM-ALBUTEROL 0.5-2.5 (3) MG/3ML IN SOLN: 3.0000 mL | Freq: Once | RESPIRATORY_TRACT | Status: DC

## 2018-06-24 NOTE — Progress Notes (Addendum)
[place] for Heparin / Warfarin Indication: atrial fibrillation  Allergies  Allergen Reactions  . Penicillins Rash and Other (See Comments)    PATIENT HAS HAD A PCN REACTION WITH IMMEDIATE RASH, FACIAL/TONGUE/THROAT SWELLING, SOB, OR LIGHTHEADEDNESS WITH HYPOTENSION:  #  #  #  YES  #  #  #   Has patient had a PCN reaction causing severe rash involving mucus membranes or skin necrosis: No Has patient had a PCN reaction that required hospitalization No Has patient had a PCN reaction occurring within the last 10 years: No If all of the above answers are "NO", then may proceed with Cephalosporin use.  . Prednisone Other (See Comments)    Wheezing  . Procaine Hcl Other (See Comments)    Passed out after being given this at dental appointment  . Levofloxacin Rash  . Oxycodone Other (See Comments)    constipation    Patient Measurements: Height: 5\' 10"  (177.8 cm) Weight: 186 lb 11.7 oz (84.7 kg) IBW/kg (Calculated) : 73 Heparin Dosing Weight: 83 kg  Vital Signs: Temp: 98.1 F (36.7 C) (01/06 0820) Temp Source: Oral (01/06 0820) BP: 137/75 (01/06 0820) Pulse Rate: 70 (01/06 0820)  Labs: Recent Labs    06/22/18 0208 06/22/18 0858 06/23/18 0233 06/23/18 1653 06/24/18 0222  HGB 10.8*  --  11.4*  --  10.6*  HCT 34.9*  --  35.6*  --  33.4*  PLT 100*  --  123*  --  143*  LABPROT 22.8* 20.1* 17.8*  --  16.5*  INR 2.04 1.74 1.48  --  1.35  HEPARINUNFRC  --   --   --  0.52 0.50  CREATININE 0.94  --  0.96  --  0.84    Estimated Creatinine Clearance: 65.2 mL/min (by C-G formula based on SCr of 0.84 mg/dL).   Assessment: 39 YOM s/p laparoscopic appendectomy on warfarin PTA for afib. Patient also has history of TAVR. Pharmacy has been consulted to start heparin and warfarin. Received vitamin K 10 mg on 1/3 and 5 mg on 1/4.  Heparin level is therapeutic at 0.5 on 1200 units/hr. INR is subtherapeutic at 1.35. No bleeding noted, Hgb stable,  platelets trending up. On Flagyl which can increase sensitivity to warfarin however also had 15 mg vitamin K - watch INR trend.  PTA warfarin: 5 mg daily exc 2.5 mg Tues/Fri  Goal of Therapy:  INR 2-3 Heparin level 0.3-0.7 units/ml Monitor platelets by anticoagulation protocol: Yes    Plan:  Continue heparin drip at 1200 units/hr Warfarin 7.5 mg PO tonight Daily INR, CBC, heparin level Monitor for s/sx of bleeding   Renold Genta, PharmD, BCPS Clinical Pharmacist Clinical phone for 06/24/2018 until 3p is [phone] 06/24/2018 9:43 AM  **Pharmacist phone directory can now be found on [email or web address] listed under [place]**

## 2018-06-24 NOTE — Progress Notes (Signed)
Called to patient's bedside by RN for multiple complaints including productive cough and increased urination.  Patient seen by a cardiology today with good urine output and kidney function, restarted home Lasix which has been held during duration of hospitalization.  Patient denies suprapubic pain or dysuria.  Patient also reporting nonproductive cough without chest pain or increased work of breathing.  He did desat to 80% while lying in bed prior to PT ambulating him.  Patient placed on 3 L [place].  Clear to auscultation bilaterally without rales, rhonchi, or wheeze.  Patient is coughing on exam and spitting nonbloody sputum.  We will try dose of DuoNeb and obtain 2-view CXR.  He is currently on cefepime and Flagyl (discontinue vancomycin as of yesterday).  PE less likely due to current heparin drip.  Patient and family in agreement.  We will continue to monitor closely.  Harriet [place], [place], PGY-3

## 2018-06-24 NOTE — Care Management Note (Addendum)
Case Management Note  Patient Details  Name: Wayne [name] MRN: [id number] Date of Birth: April 17, 1930  Subjective/Objective:    Pt is s/p  Lap appendectomy                Action/Plan:   PTA independent from home with wife , used walker as needed.  Pt needs PT reassessment post surgery.  CM provided [facility] list by medicare.gov to pt and daughter at bedside .  CM will follow up   Expected Discharge Date:                  Expected Discharge Plan:  Home w Hospice Care  In-House Referral:     Discharge planning Services  CM Consult  Post Acute Care Choice:    Choice offered to:     DME Arranged:    DME Agency:     HH Arranged:    [place] Agency:     Status of Service:  In process, will continue to follow  If discussed at Long Length of Stay Meetings, dates discussed:    Additional Comments:  Maryclare Labrador, RN 06/24/2018, 11:40 AM

## 2018-06-24 NOTE — Progress Notes (Signed)
Progress Note  Patient Name: Wayne [name] Date of Encounter: 06/24/2018  Primary Cardiologist: Kirk Ruths, MD   Subjective   Mild dyspnea; no CP  Inpatient Medications    Scheduled Meds: . acetaminophen  650 mg Oral Q6H  . digoxin  125 mcg Oral Daily  . docusate sodium  100 mg Oral BID  . feeding supplement  1 Container Oral BID BM  . finasteride  5 mg Oral QHS  . Melatonin  9 mg Oral QHS  . metoprolol tartrate  12.5 mg Oral BID  . polyethylene glycol  17 g Oral Daily  . warfarin  7.5 mg Oral ONCE-1800  . Warfarin - Pharmacist Dosing Inpatient   Does not apply q1800   Continuous Infusions: . sodium chloride 10 mL/hr at 06/23/18 1033  . ceFEPime (MAXIPIME) IV 2 g (06/24/18 1009)  . heparin 1,200 Units/hr (06/24/18 0315)  . metronidazole 500 mg (06/24/18 0856)  . vancomycin 1,500 mg (06/23/18 1057)   PRN Meds: bisacodyl, traMADol   Vital Signs    Vitals:   06/23/18 0955 06/23/18 2258 06/24/18 0500 06/24/18 0820  BP:  112/86  137/75  Pulse:  84  70  Resp:  20    Temp:  98.1 F (36.7 C)  98.1 F (36.7 C)  TempSrc:  Oral  Oral  SpO2: 96% 93%  93%  Weight:   84.7 kg   Height:        Intake/Output Summary (Last 24 hours) at 06/24/2018 1039 Last data filed at 06/24/2018 0200 Gross per 24 hour  Intake 714 ml  Output 700 ml  Net 14 ml   Filed Weights   06/21/18 0621 06/22/18 0500 06/24/18 0500  Weight: 81 kg 82.5 kg 84.7 kg    Telemetry    Atrial fibrillation rate controlled - Personally Reviewed   Physical Exam   GEN: No acute distress. Elderly  Neck: Positive JVD Cardiac: irregular Respiratory: CTA anteriorly GI: Mildly distended MS: trace hip edema Neuro:  Nonfocal    Labs    Chemistry Recent Labs  Lab 06/20/18 0856  06/22/18 0208 06/23/18 0233 06/24/18 0222  NA 139   < > 141 140 140  K 3.9   < > 3.0* 4.0 4.5  CL 103   < > 113* 115* 115*  CO2 23   < > 17* 18* 19*  GLUCOSE 103*   < > 85 178* 108*  BUN 14   < > 11 17 21     CREATININE 1.02   < > 0.94 0.96 0.84  CALCIUM 8.8*   < > 8.3* 8.3* 8.3*  PROT 6.5  --   --  5.9*  --   ALBUMIN 3.8  --   --  2.8*  --   AST 29  --   --  30  --   ALT 12  --   --  16  --   ALKPHOS 40  --   --  33*  --   BILITOT 1.1  --   --  0.3  --   GFRNONAA >60   < > >60 >60 >60  GFRAA >60   < > >60 >60 >60  ANIONGAP 13   < > 11 7 6    < > = values in this interval not displayed.     Hematology Recent Labs  Lab 06/22/18 0208 06/23/18 0233 06/24/18 0222  WBC 7.9 8.0 10.6*  RBC 3.70* 3.79* 3.55*  HGB 10.8* 11.4* 10.6*  HCT 34.9* 35.6* 33.4*  MCV 94.3 93.9 94.1  MCH 29.2 30.1 29.9  MCHC 30.9 32.0 31.7  RDW 16.2* 16.2* 16.6*  PLT 100* 123* 143*    Patient Profile     85 y.o. male with past medical history of coronary artery disease, permanent atrial fibrillation, severe mitral regurgitation, aortic stenosis with prior TAVR March 2019 and now status post appendectomy with acute on chronic diastolic congestive heart failure.  Last echocardiogram May 2019 showed normal LV function, bioprosthetic aortic valve with trace aortic insufficiency, moderate mitral stenosis, severe mitral regurgitation and biatrial enlargement.  Assessment & Plan    1 acute on chronic diastolic congestive heart failure-patient appears to be volume overloaded on examination.  Will resume Lasix 40 mg daily and follow renal function.  2 prior aortic valve replacement-patient is status post TAVR March 2019.  3 permanent atrial fibrillation-continue metoprolol and digoxin for rate control.  Continue IV heparin until INR is greater than or equal to 2 (patient has a history of CVA).  4 status post appendectomy-Per general surgery.  5 hypertension-patient's blood pressure is controlled.  Continue present medications and follow.  6 history of severe mitral regurgitation-medical therapy.  Not a candidate for mitraclip.    For questions or updates, please contact [place] Please consult  [email or web address] for contact info under        Signed, Kirk Ruths, MD  06/24/2018, 10:39 AM

## 2018-06-24 NOTE — Progress Notes (Addendum)
Family Medicine Teaching Service Daily Progress Note Intern Pager: [phone]  Patient name: Wayne [name] Medical record number: [id number] Date of birth: 08-01-1929 Age: 85 y.o. Gender: male  Primary Care Provider: [name], DO Consultants: Cardiology, General Surgery Code Status: DNR  Pt Overview and Major Events to Date:  1/2: Admitted for sepsis d/t appendicitis 1/3: GenSurg plans Sx 1/4 1/4: Laproscopic appendectomy  Assessment and Plan: Wayne [name] is a 85 y.o. male presenting with weakness and fall. PMH is significant for A. fib on chronic anticoagulation, aortic valve replacement with porcine valve, CAD, CVD, BPH, MI s/p PCI, CVA, HFpEF, and hypertension.  Sepsis secondary to acute appendicitis: 2 days S/P Appendectomy On Vanco/cefepime/Flagyl day 5 of antibiotic therapy.  Patient has remained afebrile with stable vital signs.  WBC increased from 8 to 10.6 today.  Currently on 3L per [place], weaned to RA on rounds.  Continues to tolerate a regular diet.  Per conversation with surgery, willhave patient continue 7 days antibiotics postop through 1/11, but today will continue IV abx given bump in WBC.  Required 1 prn tramadol yesterday at 9:30 AM, but patient's wife states that he does not request medications as much as he should.  He feels "rough" today. - General surgery consulted, appreciate recommendations - Continue cefepime+Flagyl, per surgery needs 7 days Abx post op through 1/11 - d/c vanc as no evidence of MRSA - Telemetry - regular diet - pain control: will schedule tylenol, cont tramadol 50mg  q6h prn - wean O2 as tolerated, place on continuous pulse ox - IVF at [facility]   Atrial fibrillation  HFpEF: Chronic.   History of TAVR in March 2019.  On Coumadin with INR goal of 2-3.  Currently bridging warfarin with heparin.  INR today 1.35.  Heart rate has been well controlled in 80s. - Telemetry - Continue home digoxin - cont home lopressor 12.5mg  BID -  restart home Lasix 40 mg daily per cards, daily weights and strict I&O's - Holding home ASA -Continue heparin to warfarin bridge per pharmacy, goal INR 2-3  Normocytic anemia: Chronic Patient has a history of chronic anemia.  Hemoglobin remained stable after surgery.  Hemoglobin today 10.6, was 11.4 yesterday. -Continue to monitor CBC  Elbow Injury S/P Fall: Elbow pain following fall prior to presentation.  No signs of fracture on plain films.  - RICE therapy    Hx CVA with balance deficit: Chronic, stable.  Cerebellar stroke 2007.  On ASA and coumadin at home.  CT head negative for acute abnormality.  Functioning well and even drove a car this week.  Wife notes patient has been using a walker recently. - PT/OT consulted, appreciate recommendations with plans for home health at discharge  Chronic Constipation: Chronic, stable.   On colace and miralax at home prn. - We will resume diet following surgery  BPH: On alfuzosin and finasteride at home.   - Continue home finasteride 5 mg daily, hold home alfuzosin 10 mg daily  FEN/GI: Heart healthy diet PPx: Heparin IV  Disposition: Home with home health following surgery clearance for discharge, transition to p.o. antibiotics, and reaching therapeutic INR  Subjective:  Patient states that he is feeling "rough this morning" and that he had a bad night last night.  Patient is currently on 3 L O2, but states he does not have shortness of breath. Denies CP.  Complains of lower abdominal pain.  Objective: Temp:  [97.9 F (36.6 C)-98.1 F (36.7 C)] 98.1 F (36.7 C) (01/05 2258) Pulse  Rate:  [82-84] 84 (01/05 2258) Resp:  [20] 20 (01/05 2258) BP: (112-121)/(74-86) 112/86 (01/05 2258) SpO2:  [93 %-97 %] 93 % (01/05 2258)  Physical Exam: General: 85 y.o. male uncomfortable appearing Cardio: irregularly irregular Lungs: CTAB, no wheezing, no rhonchi, no crackles, no increased WOB, on 3L per [place], weaned to RA Abdomen: Soft, TTP RLQ>LLQ,  positive bowel sounds Skin: warm and dry Extremities: No edema     Laboratory: Recent Labs  Lab 06/22/18 0208 06/23/18 0233 06/24/18 0222  WBC 7.9 8.0 10.6*  HGB 10.8* 11.4* 10.6*  HCT 34.9* 35.6* 33.4*  PLT 100* 123* 143*   Recent Labs  Lab 06/20/18 0856  06/22/18 0208 06/23/18 0233 06/24/18 0222  NA 139   < > 141 140 140  K 3.9   < > 3.0* 4.0 4.5  CL 103   < > 113* 115* 115*  CO2 23   < > 17* 18* PENDING  BUN 14   < > 11 17 21   CREATININE 1.02   < > 0.94 0.96 0.84  CALCIUM 8.8*   < > 8.3* 8.3* 8.3*  PROT 6.5  --   --  5.9*  --   BILITOT 1.1  --   --  0.3  --   ALKPHOS 40  --   --  33*  --   ALT 12  --   --  16  --   AST 29  --   --  30  --   GLUCOSE 103*   < > 85 178* 108*   < > = values in this interval not displayed.    Imaging/Diagnostic Tests: CT ABDOMEN AND PELVIS WITH CONTRAST (06/20/2018) IMPRESSION: Continued mild inflammation in the right lower quadrant adjacent to the appendix which is mildly dilated at 8 mm compared with 11 mm previously. Very small focal fluid collection adjacent to the tip of the appendix measures maximally 1.3 cm. This is too small to drain. Aortic atherosclerosis. Prostate prominence with left posterior bladder wall diverticulum, Stable.  CT HEAD WITHOUT CONTRAST (06/20/2018) IMPRESSION: Mild diffuse cortical atrophy. Mild chronic ischemic white matter disease. No acute intracranial abnormality seen.    [place], DO 06/24/2018, 6:53 AM PGY-1, [place] Intern pager: [phone], text pages welcome

## 2018-06-24 NOTE — Progress Notes (Signed)
[facility] Progress Note  2 Days Post-Op  Subjective: CC-  Patient is tired this morning. Complaining of abdominal pain, has not had pain medication since 0900 yesterday because he's worried about getting constipated. Reports bloating, no flatus this AM, no appetite. Denies n/v.   Objective: Vital signs in last 24 hours: Temp:  [98.1 F (36.7 C)] 98.1 F (36.7 C) (01/06 0820) Pulse Rate:  [70-84] 70 (01/06 0820) Resp:  [20] 20 (01/05 2258) BP: (112-137)/(75-86) 137/75 (01/06 0820) SpO2:  [93 %-96 %] 93 % (01/06 0820) Weight:  [84.7 kg] 84.7 kg (01/06 0500) Last BM Date: 06/22/18  Intake/Output from previous day: 01/05 0701 - 01/06 0700 In: 914 [I.V.:114; IV Piggyback:800] Out: 700 [Urine:700] Intake/Output this shift: No intake/output data recorded.  PE: Gen:  Alert, NAD HEENT: EOM's intact, pupils equal and round Pulm:  effort normal Abd: Soft, mild distension, mild diffuse tenderness, few BS heard, multiple lap incisions cdi  Lab Results:  Recent Labs    06/23/18 0233 06/24/18 0222  WBC 8.0 10.6*  HGB 11.4* 10.6*  HCT 35.6* 33.4*  PLT 123* 143*   BMET Recent Labs    06/23/18 0233 06/24/18 0222  NA 140 140  K 4.0 4.5  CL 115* 115*  CO2 18* PENDING  GLUCOSE 178* 108*  BUN 17 21  CREATININE 0.96 0.84  CALCIUM 8.3* 8.3*   PT/INR Recent Labs    06/23/18 0233 06/24/18 0222  LABPROT 17.8* 16.5*  INR 1.48 1.35   CMP     Component Value Date/Time   NA 140 06/24/2018 0222   NA 142 10/12/2017 1214   K 4.5 06/24/2018 0222   CL 115 (H) 06/24/2018 0222   CO2 PENDING 06/24/2018 0222   GLUCOSE 108 (H) 06/24/2018 0222   BUN 21 06/24/2018 0222   BUN 18 10/12/2017 1214   CREATININE 0.84 06/24/2018 0222   CREATININE 1.02 01/27/2016 0938   CALCIUM 8.3 (L) 06/24/2018 0222   PROT 5.9 (L) 06/23/2018 0233   ALBUMIN 2.8 (L) 06/23/2018 0233   AST 30 06/23/2018 0233   ALT 16 06/23/2018 0233   ALKPHOS 33 (L) 06/23/2018 0233   BILITOT 0.3  06/23/2018 0233   GFRNONAA >60 06/24/2018 0222   GFRNONAA 67 01/27/2016 0938   GFRAA >60 06/24/2018 0222   GFRAA 78 01/27/2016 0938   Lipase     Component Value Date/Time   LIPASE 30 06/20/2018 0856       Studies/Results: No results found.  Anti-infectives: Anti-infectives (From admission, onward)   Start     Dose/Rate Route Frequency Ordered Stop   06/22/18 1000  vancomycin (VANCOCIN) 1,500 mg in sodium chloride 0.9 % 500 mL IVPB     1,500 mg 250 mL/hr over 120 Minutes Intravenous Every 24 hours 06/21/18 2201     06/21/18 1100  vancomycin (VANCOCIN) 1,500 mg in sodium chloride 0.9 % 500 mL IVPB  Status:  Discontinued     1,500 mg 250 mL/hr over 120 Minutes Intravenous Every 24 hours 06/20/18 1013 06/21/18 0818   06/21/18 1100  vancomycin (VANCOCIN) 1,250 mg in sodium chloride 0.9 % 250 mL IVPB  Status:  Discontinued     1,250 mg 166.7 mL/hr over 90 Minutes Intravenous Every 24 hours 06/21/18 0818 06/21/18 2201   06/20/18 2200  ceFEPIme (MAXIPIME) 2 g in sodium chloride 0.9 % 100 mL IVPB     2 g 200 mL/hr over 30 Minutes Intravenous Every 12 hours 06/20/18 1014     06/20/18 0915  aztreonam (  AZACTAM) 2 g in sodium chloride 0.9 % 100 mL IVPB  Status:  Discontinued     2 g 200 mL/hr over 30 Minutes Intravenous  Once 06/20/18 0905 06/20/18 0909   06/20/18 0915  metroNIDAZOLE (FLAGYL) IVPB 500 mg     500 mg 100 mL/hr over 60 Minutes Intravenous Every 8 hours 06/20/18 0905     06/20/18 0915  vancomycin (VANCOCIN) IVPB 1000 mg/200 mL premix  Status:  Discontinued     1,000 mg 200 mL/hr over 60 Minutes Intravenous  Once 06/20/18 0905 06/20/18 0909   06/20/18 0915  ceFEPIme (MAXIPIME) 2 g in sodium chloride 0.9 % 100 mL IVPB     2 g 200 mL/hr over 30 Minutes Intravenous  Once 06/20/18 0909 06/20/18 1024   06/20/18 0915  vancomycin (VANCOCIN) 1,500 mg in sodium chloride 0.9 % 500 mL IVPB     1,500 mg 250 mL/hr over 120 Minutes Intravenous  Once 06/20/18 3532 06/20/18 1316        Assessment/Plan S/p TAVR CAD A fib on coumadin HTN H/o MI H/O CVA SIRS, unclear etiology Thrombocytopenia- 123K>>143K today DNR  Recurrent Acute appendicitis Laparoscopic appendectomy 06/22/18, Dr. Georganna Skeans - perforated appendicitis - needs 7 days antibiotics postop  FEN -IV fluids/Heart healthy/Boost VTE -heparin/coumadin ID: Vancomycin 06/20/17>>day#5, Cefepime 1/2 >> day#5, flagyl 1/2 >> day#5 - Needs 7 days abx post op thru 06/29/18 Foley: out   Plan: Primary team scheduling tylenol for better pain control, continue tramadol PRN. Scheduled miralax/colace. PT/OT consult pending. Needs to get OOB to chair/ambulate. Continue IV abx today.   LOS: 3 days    Wellington [place] , [facility] 06/24/2018, 8:45 AM Pager: [phone] Mon 7:00 am -11:30 AM Tues-Fri 7:00 am-4:30 pm Sat-Sun 7:00 am-11:30 am

## 2018-06-24 NOTE — Progress Notes (Signed)
Physical Therapy Treatment Patient Details Name: Wayne [name] MRN: [id number] DOB: 12-19-1929 Today's Date: 06/24/2018    History of Present Illness Nilton Lave is a 85 y.o. male with a hx of CAD, chronic atrial fibrillation on coumadin, severe MR medically managed, severe AS s/p TAVR 08/2017, and pulmonary HTN.  Admit for Sepsis, presumed to be due to appendicitis.     PT Comments    Re-eval performed following apendectomy surgery over the weekend; co-treat performed with OT due to high pain levels and patient with low motivation this afternoon due to pain. SpO2 on room air found to be 80%, applied 2LPM O2 and improved to 87%, required 3LPM O2 to increase and hold at 94% during session, RN aware. He required MaxA for functional bed mobility and min guard to maintain balance at EOB; able to perform sit to stand with RW and ModAx1/MinAx1 (total of +2), and used urinal with U UE and min guard for balance. Then able to transfer to bedside commode with min guard and RW, SpO2 still 93-94%. He was left up in the chair with all needs met, chair alarm active, and family present. Family disgruntled regarding desaturation on room air and new therapy recommendation for ST-SNF; extensive education performed on reasoning and safety reasons for ST-SNF rather than returning home upon DC.    Follow Up Recommendations  SNF;Supervision/Assistance - 24 hour     Equipment Recommendations  Other (comment)(defer to next venue )    Recommendations for Other Services       Precautions / Restrictions Precautions Precautions: Fall;Other (comment) Precaution Comments: watch SpO2  Restrictions Weight Bearing Restrictions: No    Mobility  Bed Mobility Overal bed mobility: Needs Assistance Bed Mobility: Supine to Sit     Supine to sit: Max assist     General bed mobility comments: MaxA to elevate trunk and come to EOB   Transfers Overall transfer level: Needs assistance Equipment used:  Rolling walker (2 wheeled) Transfers: Sit to/from [employer] Transfers   Stand pivot transfers: +2 physical assistance       General transfer comment: ModA of +1, MinA of +1 (+2 total) for safety; able to take pivotal steps to chair with min guard/Mod cues for safety and sequencing   Ambulation/Gait             General Gait Details: deferred due to fatigue and pain, able to take pivotal steps during transfer    Stairs             Wheelchair Mobility    Modified Rankin (Stroke Patients Only)       Balance Overall balance assessment: Needs assistance Sitting-balance support: No upper extremity supported;Feet supported Sitting balance-Leahy Scale: Fair     Standing balance support: Bilateral upper extremity supported;During functional activity Standing balance-Leahy Scale: Poor Standing balance comment: relies on UE support                            Cognition Arousal/Alertness: Awake/alert Behavior During Therapy: Flat affect Overall Cognitive Status: Impaired/Different from baseline Area of Impairment: Orientation;Attention;Memory;Following commands;Safety/judgement;Awareness;Problem solving                 Orientation Level: Disoriented to;Situation;Time Current Attention Level: Sustained Memory: Decreased short-term memory Following Commands: Follows one step commands consistently;Follows one step commands with increased time;Follows multi-step commands inconsistently;Follows multi-step commands with increased time Safety/Judgement: Decreased awareness of safety;Decreased awareness of deficits Awareness: Intellectual Problem Solving: Slow processing;Difficulty  sequencing;Requires tactile cues;Requires verbal cues        Exercises      General Comments General comments (skin integrity, edema, etc.): SpO2 80% on room air when PT/OT entered room, applied nasal cannula at 2LPM O2, improved to 87%, required 3LPM O2 to reach 94%        Pertinent Vitals/Pain Pain Assessment: Faces Faces Pain Scale: Hurts a little bit Pain Location: abdomen Pain Descriptors / Indicators: Aching;Grimacing;Guarding Pain Intervention(s): Limited activity within patient's tolerance;Monitored during session    Home Living                      Prior Function            PT Goals (current goals can now be found in the care plan section) Acute Rehab PT Goals Patient Stated Goal: to go home PT Goal Formulation: With patient/family Time For Goal Achievement: 07/05/18 Potential to Achieve Goals: Fair Progress towards PT goals: Not progressing toward goals - comment(limited by low SpO2, elevated HR, fatigue and pain today )    Frequency    Min 3X/week(in case he can progress back to HHPT )      PT Plan Discharge plan needs to be updated    Co-evaluation PT/OT/SLP Co-Evaluation/Treatment: Yes Reason for Co-Treatment: Complexity of the patient's impairments (multi-system involvement);For patient/therapist safety;To address functional/ADL transfers PT goals addressed during session: Mobility/safety with mobility;Balance;Proper use of DME;Strengthening/ROM        AM-PAC PT "6 Clicks" Mobility   Outcome Measure  Help needed turning from your back to your side while in a flat bed without using bedrails?: A Little Help needed moving from lying on your back to sitting on the side of a flat bed without using bedrails?: A Lot Help needed moving to and from a bed to a chair (including a wheelchair)?: A Little Help needed standing up from a chair using your arms (e.g., wheelchair or bedside chair)?: A Lot Help needed to walk in hospital room?: A Little Help needed climbing 3-5 steps with a railing? : A Lot 6 Click Score: 15    End of Session Equipment Utilized During Treatment: Oxygen Activity Tolerance: Patient limited by pain Patient left: in chair;with chair alarm set;with call bell/phone within reach;with family/visitor  present Nurse Communication: Mobility status;Other (comment)(SpO2/placement of 3LPM O2 ) PT Visit Diagnosis: Muscle weakness (generalized) (M62.81);Pain;Unsteadiness on feet (R26.81);Difficulty in walking, not elsewhere classified (R26.2);Other abnormalities of gait and mobility (R26.89) Pain - Right/Left: (generalized ) Pain - part of body: (generalized )     Time: 0102-7253 PT Time Calculation (min) (ACUTE ONLY): 42 min  Charges:  $Therapeutic Activity: 8-22 mins                     Deniece Ree PT, DPT, CBIS  Supplemental Physical Therapist [place]    Pager [phone] Acute Rehab Office [phone]

## 2018-06-24 NOTE — Progress Notes (Signed)
Occupational Therapy Treatment Patient Details Name: Wayne [name] MRN: [id number] DOB: 1929-08-20 Today's Date: 06/24/2018    History of present illness Lucious Zou is a 85 y.o. male with a hx of CAD, chronic atrial fibrillation on coumadin, severe MR medically managed, severe AS s/p TAVR 08/2017, and pulmonary HTN.  Admit for Sepsis, presumed to be due to appendicitis.    OT comments  Pt seen by OT and PT for skilled co treatment with focus on self care, balance, and functional mobility. Pt received on room air and O2 at 80%. RN notified. Pt's O2 increased to 3 L via [place] for saturation at 92-25% during session. Pt incontinent of urine and RN and NT arrived to change bed linens and clothing during functional mobility. Supine >sit with mod A to EOB and pt standing with two helpers min A of 1 and Mod A of 1. Pt with frequent urgency to urinate and standing with min A for standing balance and assistance to hold urinal for him to use.  Pt able to take side steps for transfer into recliner chair with min A for safety. OT and PT discussed at length new recommendations for SNF at discharge based on pt's performance this afternoon. Pt's wife with recent hip surgery and daughter having surgery on Thursday leaving pt with no assist at home.    Follow Up Recommendations  SNF    Equipment Recommendations  None recommended by OT(defer to next venue of care)    Recommendations for Other Services      Precautions / Restrictions Precautions Precautions: Fall;Other (comment) Precaution Comments: watch SpO2  Restrictions Weight Bearing Restrictions: No       Mobility Bed Mobility Overal bed mobility: Needs Assistance Bed Mobility: Supine to Sit     Supine to sit: Max assist     General bed mobility comments: MaxA to elevate trunk and come to EOB   Transfers Overall transfer level: Needs assistance Equipment used: Rolling walker (2 wheeled) Transfers: Sit to/from [employer] Transfers   Stand pivot transfers: +2 physical assistance       General transfer comment: ModA of +1, MinA of +1 (+2 total) for safety; able to take pivotal steps to chair with min guard/Mod cues for safety and sequencing     Balance Overall balance assessment: Needs assistance Sitting-balance support: No upper extremity supported;Feet supported Sitting balance-Leahy Scale: Fair     Standing balance support: Bilateral upper extremity supported;During functional activity Standing balance-Leahy Scale: Poor Standing balance comment: relies on UE support         ADL either performed or assessed with clinical judgement   ADL Overall ADL's : Needs assistance/impaired     Grooming: Wash/dry hands;Wash/dry face;Set up;Sitting   Upper Body Bathing: Set up;Sitting   Lower Body Bathing: Moderate assistance           Vision Baseline Vision/History: Wears glasses Patient Visual Report: No change from baseline            Cognition Arousal/Alertness: Awake/alert Behavior During Therapy: Flat affect Overall Cognitive Status: Impaired/Different from baseline Area of Impairment: Orientation;Attention;Memory;Following commands;Safety/judgement;Awareness;Problem solving         Orientation Level: Disoriented to;Situation;Time Current Attention Level: Sustained Memory: Decreased short-term memory Following Commands: Follows one step commands consistently;Follows one step commands with increased time;Follows multi-step commands inconsistently;Follows multi-step commands with increased time Safety/Judgement: Decreased awareness of safety;Decreased awareness of deficits Awareness: Intellectual Problem Solving: Slow processing;Difficulty sequencing;Requires tactile cues;Requires verbal cues  General Comments SpO2 80% on room air when PT/OT entered room, applied nasal cannula at 2LPM O2, improved to 87%, required 3LPM O2 to reach 94%     Pertinent Vitals/  Pain       Pain Assessment: Faces Faces Pain Scale: Hurts a little bit Pain Location: abdomen Pain Descriptors / Indicators: Aching;Grimacing;Guarding Pain Intervention(s): Limited activity within patient's tolerance;Monitored during session         Frequency  Min 2X/week        Progress Toward Goals  OT Goals(current goals can now be found in the care plan section)  Progress towards OT goals: Progressing toward goals  Acute Rehab OT Goals Patient Stated Goal: to go home OT Goal Formulation: With patient/family Time For Goal Achievement: 07/08/18 Potential to Achieve Goals: Good  Plan Discharge plan needs to be updated    Co-evaluation    PT/OT/SLP Co-Evaluation/Treatment: Yes Reason for Co-Treatment: Complexity of the patient's impairments (multi-system involvement);For patient/therapist safety;To address functional/ADL transfers PT goals addressed during session: Mobility/safety with mobility;Balance;Proper use of DME;Strengthening/ROM OT goals addressed during session: ADL's and self-care      AM-PAC OT "6 Clicks" Daily Activity     Outcome Measure   Help from another person eating meals?: None Help from another person taking care of personal grooming?: A Little Help from another person toileting, which includes using toliet, bedpan, or urinal?: A Lot Help from another person bathing (including washing, rinsing, drying)?: A Lot Help from another person to put on and taking off regular upper body clothing?: A Lot Help from another person to put on and taking off regular lower body clothing?: A Lot 6 Click Score: 15    End of Session Equipment Utilized During Treatment: Gait belt;Rolling walker  OT Visit Diagnosis: Unsteadiness on feet (R26.81);Muscle weakness (generalized) (M62.81);Pain Pain - Right/Left: Right Pain - part of body: (abdomen)   Activity Tolerance Patient limited by fatigue   Patient Left with call bell/phone within reach;with family/visitor  present;in chair;with chair alarm set   Nurse Communication Mobility status        Time: 1447-1530 OT Time Calculation (min): 43 min  Charges: OT General Charges $OT Visit: 1 Visit OT Treatments $Self Care/Home Management : 8-22 mins  Megham Dwyer P, MS, OTR/L 06/24/2018, 4:05 PM

## 2018-06-24 NOTE — Care Management Important Message (Signed)
Important Message  Patient Details  Name: Wayne [name] MRN: [id number] Date of Birth: [birth year]   Medicare Important Message Given:  Yes    [name] Montine Circle 06/24/2018, 3:42 PM

## 2018-06-25 DIAGNOSIS — Z7409 Other reduced mobility: Secondary | ICD-10-CM

## 2018-06-25 DIAGNOSIS — R269 Unspecified abnormalities of gait and mobility: Secondary | ICD-10-CM

## 2018-06-25 DIAGNOSIS — Z789 Other specified health status: Secondary | ICD-10-CM

## 2018-06-25 DIAGNOSIS — I5033 Acute on chronic diastolic (congestive) heart failure: Secondary | ICD-10-CM

## 2018-06-25 LAB — CBC
HCT: 36.3 % — ABNORMAL LOW (ref 39.0–52.0)
Hemoglobin: 11.3 g/dL — ABNORMAL LOW (ref 13.0–17.0)
MCH: 28.9 pg (ref 26.0–34.0)
MCHC: 31.1 g/dL (ref 30.0–36.0)
MCV: 92.8 fL (ref 80.0–100.0)
Platelets: 134 10*3/uL — ABNORMAL LOW (ref 150–400)
RBC: 3.91 MIL/uL — ABNORMAL LOW (ref 4.22–5.81)
RDW: 16.5 % — ABNORMAL HIGH (ref 11.5–15.5)
WBC: 9.2 10*3/uL (ref 4.0–10.5)
nRBC: 0 % (ref 0.0–0.2)

## 2018-06-25 LAB — CULTURE, BLOOD (ROUTINE X 2)
Culture: NO GROWTH
Culture: NO GROWTH
Special Requests: ADEQUATE
Special Requests: ADEQUATE

## 2018-06-25 LAB — BASIC METABOLIC PANEL
Anion gap: 6 (ref 5–15)
BUN: 16 mg/dL (ref 8–23)
CO2: 25 mmol/L (ref 22–32)
Calcium: 8.1 mg/dL — ABNORMAL LOW (ref 8.9–10.3)
Chloride: 109 mmol/L (ref 98–111)
Creatinine, Ser: 0.84 mg/dL (ref 0.61–1.24)
GFR calc non Af Amer: >60 mL/min (ref >60)
Glucose, Bld: 110 mg/dL — ABNORMAL HIGH (ref 70–99)
Potassium: 3.4 mmol/L — ABNORMAL LOW (ref 3.5–5.1)
Sodium: 140 mmol/L (ref 135–145)

## 2018-06-25 LAB — PROTIME-INR
INR: 1.37
Prothrombin Time: 16.8 seconds — ABNORMAL HIGH (ref 11.4–15.2)

## 2018-06-25 LAB — MAGNESIUM: MAGNESIUM: 1.9 mg/dL (ref 1.7–2.4)

## 2018-06-25 LAB — HEPARIN LEVEL (UNFRACTIONATED): Heparin Unfractionated: 0.35 IU/mL (ref 0.30–0.70)

## 2018-06-25 MED ORDER — WARFARIN SODIUM 7.5 MG PO TABS
7.5000 mg | ORAL_TABLET | Freq: Once | ORAL | Status: AC
  Administered 2018-06-25: 7.5 mg via ORAL
  Filled 2018-06-25: qty 1

## 2018-06-25 MED ORDER — DM-GUAIFENESIN ER 30-600 MG PO TB12
1.0000 | ORAL_TABLET | Freq: Two times a day (BID) | ORAL | Status: DC
  Administered 2018-06-25 – 2018-06-28 (×7): 1 via ORAL
  Filled 2018-06-25 (×7): qty 1

## 2018-06-25 MED ORDER — FUROSEMIDE 10 MG/ML IJ SOLN
40.0000 mg | Freq: Two times a day (BID) | INTRAMUSCULAR | Status: DC
  Administered 2018-06-25 – 2018-06-26 (×4): 40 mg via INTRAVENOUS
  Filled 2018-06-25 (×4): qty 4

## 2018-06-25 MED ORDER — POTASSIUM CHLORIDE CRYS ER 20 MEQ PO TBCR
40.0000 meq | EXTENDED_RELEASE_TABLET | Freq: Two times a day (BID) | ORAL | Status: AC
  Administered 2018-06-25 (×2): 40 meq via ORAL
  Filled 2018-06-25 (×2): qty 2

## 2018-06-25 MED ORDER — BISACODYL 10 MG RE SUPP
10.0000 mg | Freq: Once | RECTAL | Status: AC
  Administered 2018-06-25: 10 mg via RECTAL
  Filled 2018-06-25: qty 1

## 2018-06-25 NOTE — Clinical Social Work Note (Signed)
Clinical Social Work Assessment  Patient Details  Name: Wayne [name] MRN: [id number] Date of Birth: 1929/07/25  Date of referral:  06/25/18               Reason for consult:  Facility Placement                Permission sought to share information with:  Family Supports, Customer service manager Permission granted to share information::     Name::     Teacher, English as a foreign language::  [place]  Relationship::  spouse  Contact Information:     Housing/Transportation Living arrangements for the past 2 months:  Single Family Home, [place] of Information:  Spouse Patient Interpreter Needed:  None Criminal Activity/Legal Involvement Pertinent to Current Situation/Hospitalization:  No - Comment as needed Significant Relationships:  Adult Children, Spouse Lives with:  Spouse Do you feel safe going back to the place where you live?  No Need for family participation in patient care:  No (Coment)  Care giving concerns:  Pt is alert and oriented x3. Pt's spouse present at bedside.   Social Worker assessment / plan:  CSW spoke with pt's spouse at bedside. Pt attempted to wake pt however, pt fell back asleep. Pt's spouse states she just had hip surgery and both her and her husband were at [facility] recently for rehab. Pt's spouse wants pt to go to [facility]. CSW to reach out to facility and start insurance auth.  Employment status:  Retired Nurse, adult PT Recommendations:  [place] / Referral to community resources:  [place]  Patient/Family's Response to care:  Pt's spouse verbalized understanding of CSW role and expressed appreciation for support. Pt's spouse denies any concern regarding pt care at this time.   Patient/Family's Understanding of and Emotional Response to Diagnosis, Current Treatment, and Prognosis:  Pt's spouse is understanding and  realistic regarding pt's physical limitations. Pt's spouse agreeable for pt to d/c to SNF. Pt's spouse familiar with SNF process. Pt's spouse denies any further questions or concerns regarding pt's treatment plan at this time.  Emotional Assessment Appearance:  Appears stated age Attitude/Demeanor/Rapport:  Unable to Assess Affect (typically observed):  Unable to Assess Orientation:  Oriented to Self, Oriented to Place, Oriented to Situation Alcohol / Substance use:  Not Applicable Psych involvement (Current and /or in the community):  No (Comment)  Discharge Needs  Concerns to be addressed:  Care Coordination Readmission within the last 30 days:  No Current discharge risk:  Dependent with Mobility Barriers to Discharge:  Continued Medical Work up, [employer], LCSW 06/25/2018, 2:48 PM

## 2018-06-25 NOTE — Progress Notes (Signed)
[facility] Progress Note  3 Days Post-Op  Subjective: CC-  Wife at bedside. Patient states that he does feel great. Having intermittent abdominal pain and mild nausea. No flatus or BM. No appetite, but is tolerating a few bites/sips here and there. Denies CP or COB. He does have a productive cough. Cardiology is diuresing for volume overload.  Objective: Vital signs in last 24 hours: Temp:  [97.8 F (36.6 C)-98.1 F (36.7 C)] 97.8 F (36.6 C) (01/07 0723) Pulse Rate:  [68-93] 93 (01/07 0723) Resp:  [18] 18 (01/06 2220) BP: (110-132)/(68-77) 132/77 (01/07 0723) SpO2:  [92 %-98 %] 96 % (01/07 0723) Weight:  [79.9 kg] 79.9 kg (01/07 0500) Last BM Date: 06/22/18  Intake/Output from previous day: 01/06 0701 - 01/07 0700 In: 100 [IV Piggyback:100] Out: 2100 [Urine:2100] Intake/Output this shift: No intake/output data recorded.  PE: Gen:  Alert, NAD HEENT: EOM's intact, pupils equal and round Pulm:  effort normal, diminished breath sounds bilateral bases, no wheezing Abd: Soft, mild distension, mild diffuse tenderness, + BS, multiple lap incisions cdi  Lab Results:  Recent Labs    06/24/18 0222 06/25/18 0319  WBC 10.6* 9.2  HGB 10.6* 11.3*  HCT 33.4* 36.3*  PLT 143* 134*   BMET Recent Labs    06/24/18 0222 06/25/18 0319  NA 140 140  K 4.5 3.4*  CL 115* 109  CO2 19* 25  GLUCOSE 108* 110*  BUN 21 16  CREATININE 0.84 0.84  CALCIUM 8.3* 8.1*   PT/INR Recent Labs    06/24/18 0222 06/25/18 0319  LABPROT 16.5* 16.8*  INR 1.35 1.37   CMP     Component Value Date/Time   NA 140 06/25/2018 0319   NA 142 10/12/2017 1214   K 3.4 (L) 06/25/2018 0319   CL 109 06/25/2018 0319   CO2 25 06/25/2018 0319   GLUCOSE 110 (H) 06/25/2018 0319   BUN 16 06/25/2018 0319   BUN 18 10/12/2017 1214   CREATININE 0.84 06/25/2018 0319   CREATININE 1.02 01/27/2016 0938   CALCIUM 8.1 (L) 06/25/2018 0319   PROT 5.9 (L) 06/23/2018 0233   ALBUMIN 2.8 (L) 06/23/2018  0233   AST 30 06/23/2018 0233   ALT 16 06/23/2018 0233   ALKPHOS 33 (L) 06/23/2018 0233   BILITOT 0.3 06/23/2018 0233   GFRNONAA >60 06/25/2018 0319   GFRNONAA 67 01/27/2016 0938   GFRAA >60 06/25/2018 0319   GFRAA 78 01/27/2016 0938   Lipase     Component Value Date/Time   LIPASE 30 06/20/2018 0856       Studies/Results: Dg Chest 2 View  Result Date: 06/24/2018 CLINICAL DATA:  Oxygen desaturation EXAM: CHEST - 2 VIEW COMPARISON:  06/20/2018, 04/23/2018 FINDINGS: Cardiomegaly with vascular congestion and moderate diffuse interstitial edema. Development of moderate bilateral pleural effusions. Valve replacement. Aortic atherosclerosis. No pneumothorax. Bibasilar left greater than right consolidation. IMPRESSION: 1. Moderate bilateral pleural effusions. 2. Cardiomegaly with vascular congestion and worsened pulmonary edema. 3. Superimposed atelectasis or pneumonia at the left greater than right lung base. Electronically Signed   By: Donavan Foil M.D.   On: 06/24/2018 19:50    Anti-infectives: Anti-infectives (From admission, onward)   Start     Dose/Rate Route Frequency Ordered Stop   06/22/18 1000  vancomycin (VANCOCIN) 1,500 mg in sodium chloride 0.9 % 500 mL IVPB  Status:  Discontinued     1,500 mg 250 mL/hr over 120 Minutes Intravenous Every 24 hours 06/21/18 2201 06/24/18 1050   06/21/18 1100  vancomycin (VANCOCIN) 1,500 mg in sodium chloride 0.9 % 500 mL IVPB  Status:  Discontinued     1,500 mg 250 mL/hr over 120 Minutes Intravenous Every 24 hours 06/20/18 1013 06/21/18 0818   06/21/18 1100  vancomycin (VANCOCIN) 1,250 mg in sodium chloride 0.9 % 250 mL IVPB  Status:  Discontinued     1,250 mg 166.7 mL/hr over 90 Minutes Intravenous Every 24 hours 06/21/18 0818 06/21/18 2201   06/20/18 2200  ceFEPIme (MAXIPIME) 2 g in sodium chloride 0.9 % 100 mL IVPB     2 g 200 mL/hr over 30 Minutes Intravenous Every 12 hours 06/20/18 1014     06/20/18 0915  aztreonam (AZACTAM) 2 g in  sodium chloride 0.9 % 100 mL IVPB  Status:  Discontinued     2 g 200 mL/hr over 30 Minutes Intravenous  Once 06/20/18 0905 06/20/18 0909   06/20/18 0915  metroNIDAZOLE (FLAGYL) IVPB 500 mg     500 mg 100 mL/hr over 60 Minutes Intravenous Every 8 hours 06/20/18 0905     06/20/18 0915  vancomycin (VANCOCIN) IVPB 1000 mg/200 mL premix  Status:  Discontinued     1,000 mg 200 mL/hr over 60 Minutes Intravenous  Once 06/20/18 0905 06/20/18 0909   06/20/18 0915  ceFEPIme (MAXIPIME) 2 g in sodium chloride 0.9 % 100 mL IVPB     2 g 200 mL/hr over 30 Minutes Intravenous  Once 06/20/18 0909 06/20/18 1024   06/20/18 0915  vancomycin (VANCOCIN) 1,500 mg in sodium chloride 0.9 % 500 mL IVPB     1,500 mg 250 mL/hr over 120 Minutes Intravenous  Once 06/20/18 3151 06/20/18 1316       Assessment/Plan S/p TAVR CAD A fib on coumadin HTN H/o MI H/O CVA SIRS, unclear etiology Thrombocytopenia- 143K>>134K today DNR  RecurrentAcute appendicitis Laparoscopic appendectomy 06/22/18, Dr. Georganna Skeans - perforated appendicitis - needs 7 days antibiotics postop  FEN -IV fluids/Heart healthy/Boost VTE -heparin/coumadin ID: Vancomycin 06/20/17>>1/6, Cefepime 1/2 >> day#6, flagyl 1/2 >> day#6 - Needs 7 days abx post op thru 06/29/18 Foley: out   Plan: Lasix per cardiology. Potassium being replaced, check magnesium. continue diet as tolerated. Dulcolax suppository today. Continue PT/OT and ambulation/OOB. Schedule mucinex for cough and encouraged IS more frequently. Continue IV abx.   LOS: 4 days    Wellington [place] , [facility] 06/25/2018, 8:28 AM Pager: [phone] Mon 7:00 am -11:30 AM Tues-Fri 7:00 am-4:30 pm Sat-Sun 7:00 am-11:30 am

## 2018-06-25 NOTE — Progress Notes (Addendum)
Family Medicine Teaching Service Daily Progress Note Intern Pager: [phone]  Patient name: Wayne [name] Medical record number: [id number] Date of birth: 01-28-1930 Age: 85 y.o. Gender: male  Primary Care Provider: [name], DO Consultants: Cardiology, General Surgery Code Status: DNR  Pt Overview and Major Events to Date:  1/2: Admitted for sepsis d/t appendicitis 1/3: GenSurg plans Sx 1/4 1/4: Laproscopic appendectomy  Assessment and Plan: Wayne [name] is a 85 y.o. male presenting with weakness and fall. PMH is significant for A. fib on chronic anticoagulation, aortic valve replacement with porcine valve, CAD, CVD, BPH, MI s/p PCI, CVA, HFpEF, and hypertension.  Sepsis secondary to acute appendicitis: 3 days S/P Appendectomy On Cefepime/Flagyl day 6 of total antibiotic therapy.  Vancomycin DC'd yesterday.  Patient remains afebrile with stable vital signs.  WBC 10.6>9.2 today.  Given improvement in white blood cell count and lack of fever, will likely transition to p.o. antibiotics today pending surgery recommendations.  Tylenol is scheduled yesterday as pain did not seem well controlled and patient was not asking for pain medications.  Patient received tramadol as needed once yesterday morning, otherwise has not received tramadol.  Pain this morning is well controlled since scheduling Tylenol. - General surgery consulted, appreciate recommendations - Continue cefepime+Flagyl, per surgery needs 7 days Abx post op through 1/11 - d/c vanc as no evidence of MRSA - Telemetry - regular diet - pain control: cont sch tylenol, cont tramadol 50mg  q6h prn - IVF at [place] requirement: Likely 2/2 CHF Patient placed on oxygen following surgery.  Yesterday was attempted to wean to room air but had desaturations to the 80s and was placed back on O2.  Patient on 3 L per O2 overnight.  Chest x-ray performed yesterday shows moderate bilateral pleural effusions, cardiomegaly  with vascular congestion and worsened pulmonary edema, superimposed atelectasis or pneumonia at the left greater then right lung base.  This is likely secondary to the patient's heart failure as Lasix had been held until yesterday due to surgery and soft blood pressures from sepsis.  Patient restarted on home Lasix 40 mg QD yesterday and cardiology is also on board.  Diuresed 2 L yesterday. - wean O2 as tolerated, place on continuous pulse ox -Per cardiology, increase Lasix to 40mg  IV BID today -Cardiology following appreciate recommendations  Hypokalemia: Likely 2/2 resuming Lasix yesterday.  K 3.4 today. - check Mg - K-Dur 40 mEq x1 - f/u BMP   Atrial fibrillation  HFpEF: Chronic.   History of TAVR in March 2019.  On Coumadin with INR goal of 2-3.  Currently bridging warfarin with heparin.  Per cardiology continue IV heparin until INR is greater than or equal to 2 as patient has a history of CVA.  INR today 1.37.  Heart rate has been well controlled in the 80s. - Telemetry - Continue home digoxin - cont home lopressor 12.5mg  BID - cont Lasix per cards, daily weights and strict I&O's - Holding home ASA -Continue heparin to warfarin bridge per pharmacy, goal INR 2-3  Normocytic anemia: Chronic Patient has a history of chronic anemia.   Hemoglobin stable at 11.3. -Continue to monitor CBC  Elbow Injury S/P Fall: Elbow pain following fall prior to presentation.  No signs of fracture on plain films.  - RICE therapy    Hx CVA with balance deficit: Chronic, stable.  Cerebellar stroke 2007.  On ASA and coumadin at home.  CT head negative for acute abnormality.  Functioning well and even drove  a car this week.  Wife notes patient has been using a walker recently. - PT/OT consulted, appreciate recommendations, now recommending SNF - Social work consulted for SNF placement  Chronic Constipation: Chronic, stable.   On colace and miralax at home prn. - We will resume diet following  surgery  BPH: On alfuzosin and finasteride at home.   - Continue home finasteride 5 mg daily, hold home alfuzosin 10 mg daily  FEN/GI: Heart healthy diet PPx: Heparin IV  Disposition: SNF, social work consulted, needs surgery clearance for discharge, transition to p.o. antibiotics, and INR in therapeutic range  Subjective:  Patient states that he is not feeling very well this morning, but is unable to verbalize the problem.  Denies any abdominal pain at present.  States that his breathing is doing well.  Objective: Temp:  [98.1 F (36.7 C)] 98.1 F (36.7 C) (01/06 2220) Pulse Rate:  [68-90] 68 (01/06 2220) Resp:  [18] 18 (01/06 2220) BP: (110-137)/(68-77) 131/77 (01/06 2220) SpO2:  [92 %-98 %] 98 % (01/06 1715) Weight:  [79.9 kg] 79.9 kg (01/07 0500)  Physical Exam: General: 85 y.o. male lying with eyes closed, but awake and responding to questions Cardio: irregularly irregular Lungs: CTAB, no wheezing, no rhonchi, no crackles, no increased work of breathing, on 3L per [place] Abdomen: Soft, mildly TTP in RLQ, positive bowel sounds Skin: warm and dry Extremities: No edema     Laboratory: Recent Labs  Lab 06/23/18 0233 06/24/18 0222 06/25/18 0319  WBC 8.0 10.6* 9.2  HGB 11.4* 10.6* 11.3*  HCT 35.6* 33.4* 36.3*  PLT 123* 143* 134*   Recent Labs  Lab 06/20/18 0856  06/23/18 0233 06/24/18 0222 06/25/18 0319  NA 139   < > 140 140 140  K 3.9   < > 4.0 4.5 3.4*  CL 103   < > 115* 115* 109  CO2 23   < > 18* 19* 25  BUN 14   < > 17 21 16   CREATININE 1.02   < > 0.96 0.84 0.84  CALCIUM 8.8*   < > 8.3* 8.3* 8.1*  PROT 6.5  --  5.9*  --   --   BILITOT 1.1  --  0.3  --   --   ALKPHOS 40  --  33*  --   --   ALT 12  --  16  --   --   AST 29  --  30  --   --   GLUCOSE 103*   < > 178* 108* 110*   < > = values in this interval not displayed.    Imaging/Diagnostic Tests: Dg Chest 2 View  Result Date: 06/24/2018 CLINICAL DATA:  Oxygen desaturation EXAM: CHEST - 2 VIEW  COMPARISON:  06/20/2018, 04/23/2018 FINDINGS: Cardiomegaly with vascular congestion and moderate diffuse interstitial edema. Development of moderate bilateral pleural effusions. Valve replacement. Aortic atherosclerosis. No pneumothorax. Bibasilar left greater than right consolidation. IMPRESSION: 1. Moderate bilateral pleural effusions. 2. Cardiomegaly with vascular congestion and worsened pulmonary edema. 3. Superimposed atelectasis or pneumonia at the left greater than right lung base. Electronically Signed   By: Donavan Foil M.D.   On: 06/24/2018 19:50      [place], Bernita Raisin, DO 06/25/2018, 7:22 AM PGY-1, [place] Intern pager: [phone], text pages welcome

## 2018-06-25 NOTE — NC FL2 (Addendum)
[place] MEDICAID FL2 LEVEL OF CARE SCREENING TOOL     IDENTIFICATION  Patient Name: Wayne [name] Birthdate: 1930-06-05 Sex: male Admission Date (Current Location): 06/20/2018  [facility] and [place] Number:  Herbalist and Address:  [facility]. [facility] [place] [address] [place], [place] [address]      Provider Number: [id number]  Attending Physician Name and Address:  Martyn Malay, MD  Relative Name and Phone Number:       Current Level of Care: Hospital Recommended Level of Care: [place] Prior Approval Number:    Date Approved/Denied:   PASRR Number: [id number]  Discharge Plan: SNF    Current Diagnoses: Patient Active Problem List   Diagnosis Date Noted  . Sepsis ([place]) 06/20/2018  . SIRS (systemic inflammatory response syndrome) ([place]) 06/20/2018  . Dyspnea   . Normocytic anemia   . Appendicitis, acute 04/21/2018  . Pain in left knee 02/26/2018  . Pain in right knee 02/26/2018  . Fall 01/21/2018  . Closed fracture of left inferior pubic ramus ([place]) 01/21/2018  . Constipation 12/19/2017  .  hernia of linea alba 12/19/2017  . Osteoarthritis of hip 09/24/2017  . Gastroenteritis 09/10/2017  . S/P TAVR (transcatheter aortic valve replacement) 09/04/2017  . Severe mitral regurgitation   . Insomnia 07/10/2017  . High risk medication use 07/10/2017  . Long term current use of anticoagulant 07/10/2017  . Chronic diastolic CHF (congestive heart failure) ([place]) 05/20/2017  . Hyponatremia 05/20/2017  . Severe aortic stenosis 05/20/2017  . Urinary retention   . 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 Spinal stenosis at L4-L5 level 04/26/2017  . Lumbar radiculopathy, chronic 04/26/2017  . Benign colonic polyp- 25 yrs ago.  11/16/2016  . Vitamin D deficiency 11/16/2016  . H/O non anemic vitamin B12 deficiency 11/16/2016  . Chronic Colonic dysmotility 11/16/2016  . Hypokalemia 09/13/2016  .  BPH with obstruction/lower urinary tract symptoms 09/13/2016  . Urinary frequency 07/10/2016  . Left bundle branch block (LBBB) on electrocardiogram 06/30/2016  . History of stroke 06/30/2016  . Generalized weakness 06/30/2016  . Asthenia 06/30/2016  . Osteoarthritis 04/04/2016  . Encounter for therapeutic drug monitoring 08/06/2013  . Chronic anticoagulation   . HTN (hypertension)   . CAD (coronary artery disease)   . Chronic atrial fibrillation 09/13/2010  . h/o CVA (cerebrovascular accident due to intracerebral hemorrhage) 09/13/2010    Orientation RESPIRATION BLADDER Height & Weight     Self, Place, Situation  O2(Nasal Cannula 3L) Continent Weight: 176 lb 2.4 oz (79.9 kg) Height:  5\' 10"  (177.8 cm)  BEHAVIORAL SYMPTOMS/MOOD NEUROLOGICAL BOWEL NUTRITION STATUS      Continent Diet(heart healthy, thin liquids)  AMBULATORY STATUS COMMUNICATION OF NEEDS Skin   Extensive Assist Verbally Surgical wounds(closed incision on abdomen, liquid adhesive dressing)                       Personal Care Assistance Level of Assistance  Bathing, Feeding, Dressing Bathing Assistance: Maximum assistance Feeding assistance: Independent Dressing Assistance: Maximum assistance     Functional Limitations Info  Sight, Hearing, Speech Sight Info: Adequate Hearing Info: Adequate Speech Info: Adequate    SPECIAL CARE FACTORS FREQUENCY  PT (By licensed PT), OT (By licensed OT)     PT Frequency: 3x OT Frequency: 3x            Contractures      Additional Factors Info  Code Status, Allergies Code Status  Info: DNR Allergies Info: Penicillins, Prednisone, Procaine Hcl, Levofloxacin, Oxycodone           Current Medications (06/25/2018):  This is the current hospital active medication list Current Facility-Administered Medications  Medication Dose Route Frequency Provider Last Rate Last Dose  . 0.9 %  sodium chloride infusion   Intravenous Continuous Meccariello, Bernita Raisin, DO 10  mL/hr at 06/23/18 1033    . acetaminophen (TYLENOL) tablet 650 mg  650 mg Oral Q6H Meccariello, Bailey J, DO   650 mg at 06/25/18 1143  . ceFEPIme (MAXIPIME) 2 g in sodium chloride 0.9 % 100 mL IVPB  2 g Intravenous Q12H Meccariello, Bailey J, DO 200 mL/hr at 06/25/18 1044 2 g at 06/25/18 1044  . dextromethorphan-guaiFENesin (MUCINEX DM) 30-600 MG per 12 hr tablet 1 tablet  1 tablet Oral BID Meuth, Brooke A, PA-C   1 tablet at 06/25/18 0914  . digoxin (LANOXIN) tablet 125 mcg  125 mcg Oral Daily Cleophas Dunker, DO   125 mcg at 06/25/18 0914  . docusate sodium (COLACE) capsule 100 mg  100 mg Oral BID Meuth, Brooke A, PA-C   100 mg at 06/25/18 0919  . feeding supplement (BOOST / RESOURCE BREEZE) liquid 1 Container  1 Container Oral BID BM Meuth, Brooke A, PA-C   1 Container at 06/25/18 [phone]  . finasteride (PROSCAR) tablet 5 mg  5 mg Oral QHS Meccariello, Bernita Raisin, DO   5 mg at 06/24/18 2221  . furosemide (LASIX) injection 40 mg  40 mg Intravenous BID Lelon Perla, MD   40 mg at 06/25/18 0930  . heparin ADULT infusion 100 units/mL (25000 units/277mL sodium chloride 0.45%)  1,200 Units/hr Intravenous Continuous Lore, Melissa A, RPH 12 mL/hr at 06/25/18 0038 1,200 Units/hr at 06/25/18 0038  . ipratropium-albuterol (DUONEB) 0.5-2.5 (3) MG/3ML nebulizer solution 3 mL  3 mL Nebulization Once [place] Bing, DO      . Melatonin TABS 9 mg  9 mg Oral QHS Meccariello, Bernita Raisin, DO   9 mg at 06/24/18 2221  . metoprolol tartrate (LOPRESSOR) tablet 12.5 mg  12.5 mg Oral BID [place] Bing, DO   12.5 mg at 06/25/18 7517  . metroNIDAZOLE (FLAGYL) IVPB 500 mg  500 mg Intravenous Q8H Meccariello, Bailey J, DO 100 mL/hr at 06/25/18 0922 500 mg at 06/25/18 0017  . polyethylene glycol (MIRALAX / GLYCOLAX) packet 17 g  17 g Oral Daily Gilliam Bing, DO   17 g at 06/25/18 4944  . potassium chloride SA (K-DUR,KLOR-CON) CR tablet 40 mEq  40 mEq Oral BID WC Tammi Klippel, Sherin, DO   40 mEq at 06/25/18 0914  .  traMADol (ULTRAM) tablet 50 mg  50 mg Oral Q6H PRN Earnstine Regal, PA-C   50 mg at 06/24/18 0850  . warfarin (COUMADIN) tablet 7.5 mg  7.5 mg Oral ONCE-1800 Servando Salina, RPH      . Warfarin - Pharmacist Dosing Inpatient   Does not apply q1800 Martyn Malay, MD         Discharge Medications: Please see discharge summary for a list of discharge medications.  Relevant Imaging Results:  Relevant Lab Results:   Additional Information SS#[id number] [place] Kasyn Rolph, LCSW

## 2018-06-25 NOTE — Progress Notes (Signed)
[facility] for Heparin / Warfarin Indication: atrial fibrillation  Allergies  Allergen Reactions  . Penicillins Rash and Other (See Comments)    PATIENT HAS HAD A PCN REACTION WITH IMMEDIATE RASH, FACIAL/TONGUE/THROAT SWELLING, SOB, OR LIGHTHEADEDNESS WITH HYPOTENSION:  #  #  #  YES  #  #  #   Has patient had a PCN reaction causing severe rash involving mucus membranes or skin necrosis: No Has patient had a PCN reaction that required hospitalization No Has patient had a PCN reaction occurring within the last 10 years: No If all of the above answers are "NO", then may proceed with Cephalosporin use.  . Prednisone Other (See Comments)    Wheezing  . Procaine Hcl Other (See Comments)    Passed out after being given this at dental appointment  . Levofloxacin Rash  . Oxycodone Other (See Comments)    constipation    Patient Measurements: Height: 5\' 10"  (177.8 cm) Weight: 176 lb 2.4 oz (79.9 kg) IBW/kg (Calculated) : 73 Heparin Dosing Weight: 83 kg  Vital Signs: Temp: 97.8 F (36.6 C) (01/07 0723) Temp Source: Oral (01/07 0723) BP: 132/77 (01/07 0723) Pulse Rate: 93 (01/07 0723)  Labs: Recent Labs    06/23/18 0233 06/23/18 1653 06/24/18 0222 06/25/18 0319  HGB 11.4*  --  10.6* 11.3*  HCT 35.6*  --  33.4* 36.3*  PLT 123*  --  143* 134*  LABPROT 17.8*  --  16.5* 16.8*  INR 1.48  --  1.35 1.37  HEPARINUNFRC  --  0.52 0.50 0.35  CREATININE 0.96  --  0.84 0.84    Estimated Creatinine Clearance: 65.2 mL/min (by C-G formula based on SCr of 0.84 mg/dL).   Assessment: 62 YOM s/p laparoscopic appendectomy on warfarin PTA for afib. Patient also has history of TAVR. Pharmacy has been consulted for warfarin dosing and heparin bridge. Received vitamin K 10 mg PO on 1/3 and 5 mg IV on 1/4.  Heparin level is therapeutic at 0.35 on 1200 units/hr. INR is subtherapeutic at 1.37. No bleeding noted, Hgb stable, platelets trending up. Patient  complicated as he is on Flagyl which can increase sensitivity to warfarin however also had 15 mg vitamin K.  PTA warfarin: 5 mg daily exc 2.5 mg Tues/Fri  Goal of Therapy:  INR 2-3 Heparin level 0.3-0.7 units/ml Monitor platelets by anticoagulation protocol: Yes    Plan:  Continue heparin drip at 1200 units/hr Warfarin 7.5 mg PO tonight Daily INR, CBC, heparin level Monitor for s/sx of bleeding   Azzie Roup D PGY1 Pharmacy Resident  Phone [phone] Please use AMION for clinical pharmacists numbers  06/25/2018      9:55 AM

## 2018-06-25 NOTE — Progress Notes (Signed)
Progress Note  Patient Name: Wayne [name] Date of Encounter: 06/25/2018  Primary Cardiologist: Kirk Ruths, MD   Subjective   "Don't feel well"; abdominal pain; no CP; no dyspnea  Inpatient Medications    Scheduled Meds: . acetaminophen  650 mg Oral Q6H  . digoxin  125 mcg Oral Daily  . docusate sodium  100 mg Oral BID  . feeding supplement  1 Container Oral BID BM  . finasteride  5 mg Oral QHS  . furosemide  40 mg Oral Daily  . ipratropium-albuterol  3 mL Nebulization Once  . Melatonin  9 mg Oral QHS  . metoprolol tartrate  12.5 mg Oral BID  . polyethylene glycol  17 g Oral Daily  . potassium chloride  40 mEq Oral BID WC  . Warfarin - Pharmacist Dosing Inpatient   Does not apply q1800   Continuous Infusions: . sodium chloride 10 mL/hr at 06/23/18 1033  . ceFEPime (MAXIPIME) IV 2 g (06/24/18 2152)  . heparin 1,200 Units/hr (06/25/18 0038)  . metronidazole 500 mg (06/25/18 0039)   PRN Meds: bisacodyl, traMADol   Vital Signs    Vitals:   06/24/18 2220 06/24/18 2220 06/25/18 0500 06/25/18 0723  BP: 131/77 131/77  132/77  Pulse: 80 68  93  Resp:  18    Temp:  98.1 F (36.7 C)  97.8 F (36.6 C)  TempSrc:  Oral  Oral  SpO2:    96%  Weight:   79.9 kg   Height:        Intake/Output Summary (Last 24 hours) at 06/25/2018 0815 Last data filed at 06/25/2018 0500 Gross per 24 hour  Intake 100 ml  Output 2000 ml  Net -1900 ml   Filed Weights   06/22/18 0500 06/24/18 0500 06/25/18 0500  Weight: 82.5 kg 84.7 kg 79.9 kg    Telemetry    Atrial fibrillation with 4 beats NSVT; rate controlled - Personally Reviewed   Physical Exam   GEN: Elderly; frail Neck: supple Cardiac: irregular, 2/6 systolic murmur Respiratory: Diminished BS bases GI: Mildly distended; s/p appendectomy MS: trace-1+ hip edema Neuro:  Grossly intact   Labs    Chemistry Recent Labs  Lab 06/20/18 0856  06/23/18 0233 06/24/18 0222 06/25/18 0319  NA 139   < > 140 140 140    K 3.9   < > 4.0 4.5 3.4*  CL 103   < > 115* 115* 109  CO2 23   < > 18* 19* 25  GLUCOSE 103*   < > 178* 108* 110*  BUN 14   < > 17 21 16   CREATININE 1.02   < > 0.96 0.84 0.84  CALCIUM 8.8*   < > 8.3* 8.3* 8.1*  PROT 6.5  --  5.9*  --   --   ALBUMIN 3.8  --  2.8*  --   --   AST 29  --  30  --   --   ALT 12  --  16  --   --   ALKPHOS 40  --  33*  --   --   BILITOT 1.1  --  0.3  --   --   GFRNONAA >60   < > >60 >60 >60  GFRAA >60   < > >60 >60 >60  ANIONGAP 13   < > 7 6 6    < > = values in this interval not displayed.     Hematology Recent Labs  Lab 06/23/18 [phone] 06/24/18 0222  06/25/18 0319  WBC 8.0 10.6* 9.2  RBC 3.79* 3.55* 3.91*  HGB 11.4* 10.6* 11.3*  HCT 35.6* 33.4* 36.3*  MCV 93.9 94.1 92.8  MCH 30.1 29.9 28.9  MCHC 32.0 31.7 31.1  RDW 16.2* 16.6* 16.5*  PLT 123* 143* 134*    Patient Profile     85 y.o. male with past medical history of coronary artery disease, permanent atrial fibrillation, severe mitral regurgitation, aortic stenosis with prior TAVR March 2019 and now status post appendectomy with acute on chronic diastolic congestive heart failure.  Last echocardiogram May 2019 showed normal LV function, bioprosthetic aortic valve with trace aortic insufficiency, moderate mitral stenosis, severe mitral regurgitation and biatrial enlargement.  Assessment & Plan    1 acute on chronic diastolic congestive heart failure-patient remains volume overloaded.  Chest x-ray shows pleural effusions.  Will increase Lasix to 40 mg IV twice daily.  Follow renal function.  Patient appears to be volume overloaded on examination.  Will resume Lasix 40 mg daily and follow renal function.  2 prior aortic valve replacement-patient is status post TAVR March 2019.  3 permanent atrial fibrillation-continue metoprolol and digoxin for rate control.  Continue IV heparin and Coumadin.  INR will need to be therapeutic prior to discontinuing heparin given history of CVA.  4 status post  appendectomy-Per general surgery.  5 hypertension-patient's blood pressure is controlled.  Continue present medications and follow.  6 history of severe mitral regurgitation-medical therapy.  Not a candidate for mitraclip.  7 hypokalemia-supplement    For questions or updates, please contact [place] Please consult [email or web address] for contact info under        Signed, Kirk Ruths, MD  06/25/2018, 8:15 AM

## 2018-06-26 DIAGNOSIS — K3532 Acute appendicitis with perforation and localized peritonitis, without abscess: Secondary | ICD-10-CM

## 2018-06-26 LAB — CBC
HCT: 35 % — ABNORMAL LOW (ref 39.0–52.0)
Hemoglobin: 11.6 g/dL — ABNORMAL LOW (ref 13.0–17.0)
MCH: 29.9 pg (ref 26.0–34.0)
MCHC: 33.1 g/dL (ref 30.0–36.0)
MCV: 90.2 fL (ref 80.0–100.0)
Platelets: 149 10*3/uL — ABNORMAL LOW (ref 150–400)
RBC: 3.88 MIL/uL — AB (ref 4.22–5.81)
RDW: 16 % — ABNORMAL HIGH (ref 11.5–15.5)
WBC: 8.8 10*3/uL (ref 4.0–10.5)
nRBC: 0 % (ref 0.0–0.2)

## 2018-06-26 LAB — BASIC METABOLIC PANEL
Anion gap: 7 (ref 5–15)
BUN: 9 mg/dL (ref 8–23)
CO2: 29 mmol/L (ref 22–32)
Calcium: 8 mg/dL — ABNORMAL LOW (ref 8.9–10.3)
Chloride: 102 mmol/L (ref 98–111)
Creatinine, Ser: 0.67 mg/dL (ref 0.61–1.24)
GFR calc non Af Amer: >60 mL/min (ref >60)
Glucose, Bld: 104 mg/dL — ABNORMAL HIGH (ref 70–99)
Potassium: 3.3 mmol/L — ABNORMAL LOW (ref 3.5–5.1)
SODIUM: 138 mmol/L (ref 135–145)

## 2018-06-26 LAB — PROTIME-INR
INR: 1.83
Prothrombin Time: 20.9 seconds — ABNORMAL HIGH (ref 11.4–15.2)

## 2018-06-26 LAB — HEPARIN LEVEL (UNFRACTIONATED): Heparin Unfractionated: 0.32 IU/mL (ref 0.30–0.70)

## 2018-06-26 MED ORDER — WARFARIN SODIUM 5 MG PO TABS
5.0000 mg | ORAL_TABLET | Freq: Once | ORAL | Status: AC
  Administered 2018-06-26: 5 mg via ORAL
  Filled 2018-06-26: qty 1

## 2018-06-26 MED ORDER — POTASSIUM CHLORIDE CRYS ER 20 MEQ PO TBCR: 40.0000 meq | EXTENDED_RELEASE_TABLET | Freq: Two times a day (BID) | ORAL | Status: DC

## 2018-06-26 MED ORDER — POTASSIUM CHLORIDE CRYS ER 20 MEQ PO TBCR
40.0000 meq | EXTENDED_RELEASE_TABLET | Freq: Three times a day (TID) | ORAL | Status: AC
  Administered 2018-06-26 (×3): 40 meq via ORAL
  Filled 2018-06-26 (×3): qty 2

## 2018-06-26 MED ORDER — SACCHAROMYCES BOULARDII 250 MG PO CAPS
250.0000 mg | ORAL_CAPSULE | Freq: Two times a day (BID) | ORAL | Status: DC
  Administered 2018-06-26 – 2018-06-28 (×5): 250 mg via ORAL
  Filled 2018-06-26 (×5): qty 1

## 2018-06-26 NOTE — Progress Notes (Signed)
Occupational Therapy Treatment Patient Details Name: Treyvin Glidden MRN: [id number] DOB: 12/20/1929 Today's Date: 06/26/2018    History of present illness Perri Lamagna is a 85 y.o. male with a hx of CAD, chronic atrial fibrillation on coumadin, severe MR medically managed, severe AS s/p TAVR 08/2017, and pulmonary HTN.  Admit for Sepsis, presumed to be due to appendicitis.    OT comments  Pt progressing towards acute OT goals. Focus of session was bed mobility, toilet transfers (to [facility]), and pericare. Pt min to mod A throughout, +2 helpful for line management. Spouse present throughout session. Pt was incontinent of bowel during session. Sitting up in recliner at end of session. Discussed general UB/LB strengthening exercises. D/c plan remains appropriate.   Follow Up Recommendations  SNF    Equipment Recommendations  Other (comment)(defer to next venue)    Recommendations for Other Services      Precautions / Restrictions Precautions Precautions: Fall;Other (comment) Precaution Comments: watch SpO2  Restrictions Weight Bearing Restrictions: No       Mobility Bed Mobility Overal bed mobility: Needs Assistance Bed Mobility: Supine to Sit     Supine to sit: Mod assist     General bed mobility comments: mod A to steady trunk during powerup and fully pivot hips to EOB position.  Transfers Overall transfer level: Needs assistance Equipment used: Rolling walker (2 wheeled) Transfers: Sit to/from [facility] Sit to Stand: Min assist;+2 safety/equipment Stand pivot transfers: Min assist;+2 safety/equipment       General transfer comment: Pt from EOB, BSC, to recliner. Assist to steady. +2 helpful for line managment. Pivotal steps from [facility] to recliner.    Balance Overall balance assessment: Needs assistance Sitting-balance support: Feet supported;Bilateral upper extremity supported Sitting balance-Leahy Scale: Fair     Standing balance  support: Bilateral upper extremity supported;During functional activity Standing balance-Leahy Scale: Poor Standing balance comment: relies on UE support                           ADL either performed or assessed with clinical judgement   ADL Overall ADL's : Needs assistance/impaired                         Toilet Transfer: Minimal assistance;+2 for safety/equipment;Stand-pivot;BSC;RW Toilet Transfer Details (indicate cue type and reason): sequencing difficulty noted, +2 helpful for line management Toileting- Clothing Manipulation and Hygiene: Moderate assistance;+2 for safety/equipment;Sit to/from stand Toileting - Clothing Manipulation Details (indicate cue type and reason): Pt stood with rw and min guard assist, total A for pericare task.     Functional mobility during ADLs: Minimal assistance;Rolling walker;+2 for safety/equipment General ADL Comments: Pivotal steps EOB>recliner. Pt incontenient of bowel during session, assist for LB bathing to clean up and pericare.     Vision       Perception     Praxis      Cognition Arousal/Alertness: Awake/alert Behavior During Therapy: Flat affect Overall Cognitive Status: Impaired/Different from baseline Area of Impairment: Orientation;Memory;Following commands;Safety/judgement;Problem solving;Attention;Awareness                 Orientation Level: Disoriented to;Situation;Time Current Attention Level: Sustained Memory: Decreased short-term memory Following Commands: Follows one step commands consistently;Follows one step commands with increased time;Follows multi-step commands inconsistently;Follows multi-step commands with increased time Safety/Judgement: Decreased awareness of safety;Decreased awareness of deficits Awareness: Intellectual Problem Solving: Slow processing;Difficulty sequencing;Requires tactile cues;Requires verbal cues General Comments: Difficulty sequencing pivotal steps  from [facility] to  recliner.         Exercises     Shoulder Instructions       General Comments      Pertinent Vitals/ Pain       Pain Assessment: Faces Faces Pain Scale: Hurts little more Pain Location: abdomen Pain Descriptors / Indicators: Aching;Grimacing;Guarding Pain Intervention(s): Limited activity within patient's tolerance;Monitored during session;Repositioned  Home Living                                          Prior Functioning/Environment              Frequency  Min 2X/week        Progress Toward Goals  OT Goals(current goals can now be found in the care plan section)  Progress towards OT goals: Progressing toward goals  Acute Rehab OT Goals Patient Stated Goal: to go home OT Goal Formulation: With patient/family Time For Goal Achievement: 07/08/18 Potential to Achieve Goals: Good ADL Goals Pt Will Perform Grooming: with supervision;with set-up;sitting;with caregiver independent in assisting Pt Will Perform Upper Body Bathing: with supervision;with set-up;sitting;with caregiver independent in assisting Pt Will Perform Lower Body Bathing: with min assist;sitting/lateral leans;sit to/from stand;with caregiver independent in assisting Pt Will Perform Upper Body Dressing: with supervision;with set-up;with caregiver independent in assisting;sitting Pt Will Perform Lower Body Dressing: with min assist;sitting/lateral leans;sit to/from stand;with caregiver independent in assisting Pt Will Transfer to Toilet: with min guard assist;with supervision;ambulating;regular height toilet;bedside commode;grab bars Pt Will Perform Toileting - Clothing Manipulation and hygiene: with min guard assist;sit to/from stand;with caregiver independent in assisting Pt Will Perform Tub/Shower Transfer: with min guard assist;with supervision;ambulating;rolling walker;shower seat;with caregiver independent in assisting;3 in 1;grab bars  Plan Discharge plan remains appropriate     Co-evaluation                 AM-PAC OT "6 Clicks" Daily Activity     Outcome Measure   Help from another person eating meals?: None Help from another person taking care of personal grooming?: A Little Help from another person toileting, which includes using toliet, bedpan, or urinal?: A Lot Help from another person bathing (including washing, rinsing, drying)?: A Lot Help from another person to put on and taking off regular upper body clothing?: A Lot Help from another person to put on and taking off regular lower body clothing?: A Lot 6 Click Score: 15    End of Session Equipment Utilized During Treatment: Rolling walker;Oxygen  OT Visit Diagnosis: Unsteadiness on feet (R26.81);Muscle weakness (generalized) (M62.81);Pain   Activity Tolerance Patient tolerated treatment well   Patient Left in chair;with call bell/phone within reach;with family/visitor present   Nurse Communication Mobility status        Time: 8502-7741 OT Time Calculation (min): 37 min  Charges: OT General Charges $OT Visit: 1 Visit OT Treatments $Self Care/Home Management : 23-37 mins  Tyrone Schimke, OT Acute Rehabilitation Services Pager: [phone] Office: [phone]    Hortencia Pilar 06/26/2018, 11:43 AM

## 2018-06-26 NOTE — Progress Notes (Signed)
[place] for Heparin / Warfarin Indication: atrial fibrillation  Allergies  Allergen Reactions  . Penicillins Rash and Other (See Comments)    PATIENT HAS HAD A PCN REACTION WITH IMMEDIATE RASH, FACIAL/TONGUE/THROAT SWELLING, SOB, OR LIGHTHEADEDNESS WITH HYPOTENSION:  #  #  #  YES  #  #  #   Has patient had a PCN reaction causing severe rash involving mucus membranes or skin necrosis: No Has patient had a PCN reaction that required hospitalization No Has patient had a PCN reaction occurring within the last 10 years: No If all of the above answers are "NO", then may proceed with Cephalosporin use.  . Prednisone Other (See Comments)    Wheezing  . Procaine Hcl Other (See Comments)    Passed out after being given this at dental appointment  . Levofloxacin Rash  . Oxycodone Other (See Comments)    constipation    Patient Measurements: Height: 5\' 10"  (177.8 cm) Weight: 176 lb 2.4 oz (79.9 kg) IBW/kg (Calculated) : 73 Heparin Dosing Weight: 83 kg  Vital Signs: Temp: 98.2 F (36.8 C) (01/08 0720) Temp Source: Oral (01/08 0720) BP: 129/68 (01/08 0720) Pulse Rate: 87 (01/08 0720)  Labs: Recent Labs    06/24/18 0222 06/25/18 0319 06/26/18 0219  HGB 10.6* 11.3* 11.6*  HCT 33.4* 36.3* 35.0*  PLT 143* 134* 149*  LABPROT 16.5* 16.8* 20.9*  INR 1.35 1.37 1.83  HEPARINUNFRC 0.50 0.35 0.32  CREATININE 0.84 0.84 0.67    Estimated Creatinine Clearance: 68.4 mL/min (by C-G formula based on SCr of 0.67 mg/dL).   Assessment: 40 YOM s/p laparoscopic appendectomy on warfarin PTA for afib. Patient also has history of TAVR. Pharmacy has been consulted for warfarin dosing and heparin bridge. Received vitamin K 10 mg PO on 1/3 and 5 mg IV on 1/4.  Heparin level is therapeutic at 0.32 on 1200 units/hr. INR is subtherapeutic at 1.83, increasing appropriately. No bleeding noted, H&H and plt stable.   Of note, he is on Flagyl which can increase  sensitivity to warfarin however also had 15 mg vitamin K.  PTA warfarin: 5 mg daily exc 2.5 mg Tues/Fri  Goal of Therapy:  INR 2-3 Heparin level 0.3-0.7 units/ml Monitor platelets by anticoagulation protocol: Yes    Plan:  Continue heparin drip at 1200 units/hr Warfarin 5 mg PO tonight Daily INR, CBC, heparin level Monitor for s/sx of bleeding   Azzie Roup D PGY1 Pharmacy Resident  Phone [phone] Please use AMION for clinical pharmacists numbers  06/26/2018      7:35 AM

## 2018-06-26 NOTE — Progress Notes (Signed)
Pt missed IV flagyl dose that was charted given by Therese Sarah RN at [facility] on 06/26/18. Medication was hung, but never started, so pt didn't receive dose. Dose given at 0915 on 06/26/18 as scheduled and pharmacist notified.   Riley Kill RN

## 2018-06-26 NOTE — Progress Notes (Addendum)
Family Medicine Teaching Service Daily Progress Note Intern Pager: [phone]  Patient name: Wayne [name] Medical record number: [id number] Date of birth: June 16, 1930 Age: 85 y.o. Gender: male  Primary Care Provider: [name], DO Consultants: Cardiology, General Surgery Code Status: DNR  Pt Overview and Major Events to Date:  1/2: Admitted for sepsis d/t appendicitis 1/3: GenSurg plans Sx 1/4 1/4: Laproscopic appendectomy  Assessment and Plan: Wayne [name] is a 85 y.o. male presenting with weakness and fall. PMH is significant for A. fib on chronic anticoagulation, aortic valve replacement with porcine valve, CAD, CVD, BPH, MI s/p PCI, CVA, HFpEF, and hypertension.  Sepsis secondary to acute appendicitis: 4 days S/P Appendectomy On Cefepime/Flagyl day 7 of total antibiotic therapy.  Patient continues to remain afebrile with stable vital signs.  BP soft last night and 98/56, likely secondary to aggressive diuresis yesterday.  WBC this a.m. continue to improve at 8.8, from 9.2.  Blood cultures complete and negative.  Patient denies pain this morning.  No tramadol use yesterday.  Spoke with surgery PA and would like for patient to continue on IV antibiotics while inpatient and will transition to p.o. on discharge. - General surgery consulted, appreciate recommendations - Continue cefepime, per surgery needs 7 days Abx post op through 1/11 - Would like to d/c Flagyl as interacts with warfarin and do not want to overshoot INR on d/c, have reached out to surgery to discuss - S/P Vanc - Telemetry - regular diet - pain control: cont sch tylenol, cont tramadol 50mg  q6h prn - IVF at [facility] O2 requirement: Likely 2/2 CHF Placed on IV Lasix 40 twice daily yesterday.  UOP 4.4 L.  Patient's blood pressure soft at 98/56 at 2100.  Creatinine improved from 0.84-0.67.  On 3 L O2 per nasal cannula overnight. - wean O2 as tolerated, place on continuous pulse ox -Diuresis per  cardiology, cont Lasix 40mg  IV BID -Cardiology following appreciate recommendations  Hypokalemia: K 3.4>51mEq K-Durx2>3.3.  Likely secondary to diuresis.  Mag 1.9.  K goal should be 4. - K-Dur 40 mEq x3 - f/u BMP - when restart PO Lasix, will start home K-dur 41mEq QD   Atrial fibrillation  HFpEF: Chronic.   History of TAVR in March 2019.  On Coumadin with INR goal of 2-3.  Currently bridging warfarin with heparin.  Per cardiology continue IV heparin until INR is greater than or equal to 2 as patient has a history of CVA.  INR today 1.83.  Heart rate 70s to 80s overnight, aside from 10-second run of asymptomatic SVT at 2157. - Telemetry -Cardiology following, appreciate recommendations - Continue home digoxin - cont home lopressor 12.5mg  BID - cont Lasix per cards, daily weights and strict I&O's - Holding home ASA -Continue heparin to warfarin bridge per pharmacy, goal INR 2-3  Normocytic anemia: Chronic Patient has a history of chronic anemia.   Hemoglobin stable at 11.6. -Continue to monitor CBC  Elbow Injury S/P Fall: Elbow pain following fall prior to presentation.  No signs of fracture on plain films.  - RICE therapy    Hx CVA with balance deficit: Chronic, stable.  Cerebellar stroke 2007.  On ASA and coumadin at home.  CT head negative for acute abnormality.  Functioning well and even drove a car this week.  Wayne [name] notes patient has been using a walker recently. - PT/OT consulted, appreciate recommendations, now recommending SNF - Social work consulted for SNF placement  Chronic Constipation: Chronic, stable.   On colace  and miralax at home prn.  2 stools yesterday. - Received suppository yesterday -MiraLAX daily  BPH: On alfuzosin and finasteride at home.   - Continue home finasteride 5 mg daily, hold home alfuzosin 10 mg daily  FEN/GI: Heart healthy diet PPx: Heparin IV  Disposition: SNF, pending placement, transition to p.o. antibiotics, completion of diuresis, and  improved O2 requirement  Subjective:  Patient denies pain this morning.  States that he is feeling much better this morning.  States that he did well overnight.  Denies chest pain, shortness of breath.  Objective: Temp:  [97.8 F (36.6 C)-98.4 F (36.9 C)] 98.4 F (36.9 C) (01/07 2200) Pulse Rate:  [79-93] 79 (01/07 2200) Resp:  [18] 18 (01/07 2200) BP: (98-132)/(56-80) 98/56 (01/07 2200) SpO2:  [91 %-97 %] 91 % (01/08 0100) Weight:  [79.9 kg] 79.9 kg (01/08 0339)  Physical Exam: General: 85 y.o. male in NAD Cardio: Irregularly irregular Lungs: CTAB, no wheezing, no rhonchi, no crackles, no increased work of breathing Abdomen: Soft, mildly tender to palpation in lower quadrants, positive bowel sounds Skin: warm and dry Extremities: No edema      Laboratory: Recent Labs  Lab 06/24/18 0222 06/25/18 0319 06/26/18 0219  WBC 10.6* 9.2 8.8  HGB 10.6* 11.3* 11.6*  HCT 33.4* 36.3* 35.0*  PLT 143* 134* 149*   Recent Labs  Lab 06/20/18 0856  06/23/18 0233 06/24/18 0222 06/25/18 0319 06/26/18 0219  NA 139   < > 140 140 140 138  K 3.9   < > 4.0 4.5 3.4* 3.3*  CL 103   < > 115* 115* 109 102  CO2 23   < > 18* 19* 25 29  BUN 14   < > 17 21 16 9   CREATININE 1.02   < > 0.96 0.84 0.84 0.67  CALCIUM 8.8*   < > 8.3* 8.3* 8.1* 8.0*  PROT 6.5  --  5.9*  --   --   --   BILITOT 1.1  --  0.3  --   --   --   ALKPHOS 40  --  33*  --   --   --   ALT 12  --  16  --   --   --   AST 29  --  30  --   --   --   GLUCOSE 103*   < > 178* 108* 110* 104*   < > = values in this interval not displayed.    Imaging/Diagnostic Tests: No results found.    [place], DO 06/26/2018, 7:00 AM PGY-1, [place] Intern pager: [phone], text pages welcome

## 2018-06-26 NOTE — Progress Notes (Signed)
Physical Therapy Treatment Patient Details Name: Wayne [name] MRN: [id number] DOB: 1929-12-11 Today's Date: 06/26/2018    History of Present Illness Wayne [name] is a 85 y.o. male with a hx of CAD, chronic atrial fibrillation on coumadin, severe MR medically managed, severe AS s/p TAVR 08/2017, and pulmonary HTN.  Admit for Sepsis, presumed to be due to appendicitis.     PT Comments    Patient progressing well tonight with therapy. Focused on transfers and therex, as well as began walking for first time in nearly a week. Patients family very encouraged with progress. Min Guard for short distance, fatigues, HR 90-110 this visit, SpO2 WNL.  Cont to rec SNF.     Follow Up Recommendations  SNF;Supervision/Assistance - 24 hour     Equipment Recommendations  (TBD)    Recommendations for Other Services       Precautions / Restrictions Precautions Precautions: Fall;Other (comment) Precaution Comments: watch SpO2  Restrictions Weight Bearing Restrictions: No    Mobility  Bed Mobility Overal bed mobility: Needs Assistance Bed Mobility: Supine to Sit     Supine to sit: Min assist     General bed mobility comments: min A  Transfers Overall transfer level: Needs assistance Equipment used: Rolling walker (2 wheeled) Transfers: Sit to/from [facility] Sit to Stand: Min assist;+2 physical assistance Stand pivot transfers: Min assist;+2 physical assistance       General transfer comment: several trials with cues for hand placement, min A x2 to stand  Ambulation/Gait Ambulation/Gait assistance: Min guard Gait Distance (Feet): 20 Feet(5' 5' 5' 5') Assistive device: Rolling walker (2 wheeled) Gait Pattern/deviations: Step-to pattern;Step-through pattern     General Gait Details: side stepping along bed, then wakls to wall and back with RW. patient and family very happy to be walking again. SpO2 WNL 2L, HR 90-110   Stairs              Wheelchair Mobility    Modified Rankin (Stroke Patients Only)       Balance Overall balance assessment: Needs assistance Sitting-balance support: Feet supported;Bilateral upper extremity supported Sitting balance-Leahy Scale: Fair     Standing balance support: Bilateral upper extremity supported;During functional activity Standing balance-Leahy Scale: Poor Standing balance comment: relies on UE support                            Cognition Arousal/Alertness: Awake/alert                                            Exercises      General Comments        Pertinent Vitals/Pain Pain Assessment: Faces Faces Pain Scale: Hurts little more Pain Location: abdomen Pain Descriptors / Indicators: Aching;Grimacing;Guarding Pain Intervention(s): Limited activity within patient's tolerance;Monitored during session    Home Living                      Prior Function            PT Goals (current goals can now be found in the care plan section) Acute Rehab PT Goals Patient Stated Goal: to go home PT Goal Formulation: With patient/family Time For Goal Achievement: 07/05/18 Potential to Achieve Goals: Fair Progress towards PT goals: Progressing toward goals    Frequency    Min 3X/week  PT Plan Discharge plan needs to be updated    Co-evaluation              AM-PAC PT "6 Clicks" Mobility   Outcome Measure  Help needed turning from your back to your side while in a flat bed without using bedrails?: A Little Help needed moving from lying on your back to sitting on the side of a flat bed without using bedrails?: A Lot Help needed moving to and from a bed to a chair (including a wheelchair)?: A Little Help needed standing up from a chair using your arms (e.g., wheelchair or bedside chair)?: A Little Help needed to walk in hospital room?: A Little Help needed climbing 3-5 steps with a railing? : A Lot 6 Click Score:  16    End of Session Equipment Utilized During Treatment: Oxygen Activity Tolerance: Patient limited by pain Patient left: with chair alarm set;with family/visitor present;in bed Nurse Communication: Mobility status;Other (comment) PT Visit Diagnosis: Muscle weakness (generalized) (M62.81);Pain;Unsteadiness on feet (R26.81);Difficulty in walking, not elsewhere classified (R26.2);Other abnormalities of gait and mobility (R26.89)     Time: 1850-1920 PT Time Calculation (min) (ACUTE ONLY): 30 min  Charges:  $Gait Training: 8-22 mins $Therapeutic Activity: 8-22 mins                     Reinaldo Berber, PT, DPT Acute Rehabilitation Services Pager: [phone] Office: [phone]     Reinaldo Berber 06/26/2018, 7:27 PM

## 2018-06-26 NOTE — Progress Notes (Signed)
Progress Note  Patient Name: Wayne [name] Born Date of Encounter: 06/26/2018  Primary Cardiologist: Kirk Ruths, MD   Subjective   Denies CP; mild dyspnea; no abd pain  Inpatient Medications    Scheduled Meds: . acetaminophen  650 mg Oral Q6H  . dextromethorphan-guaiFENesin  1 tablet Oral BID  . digoxin  125 mcg Oral Daily  . docusate sodium  100 mg Oral BID  . feeding supplement  1 Container Oral BID BM  . finasteride  5 mg Oral QHS  . furosemide  40 mg Intravenous BID  . ipratropium-albuterol  3 mL Nebulization Once  . Melatonin  9 mg Oral QHS  . metoprolol tartrate  12.5 mg Oral BID  . polyethylene glycol  17 g Oral Daily  . potassium chloride  40 mEq Oral TID  . saccharomyces boulardii  250 mg Oral BID  . warfarin  5 mg Oral ONCE-1800  . Warfarin - Pharmacist Dosing Inpatient   Does not apply q1800   Continuous Infusions: . sodium chloride 10 mL/hr at 06/23/18 1033  . ceFEPime (MAXIPIME) IV Stopped (06/25/18 2330)  . heparin 1,200 Units/hr (06/25/18 2236)  . metronidazole 500 mg (06/26/18 0936)   PRN Meds: traMADol   Vital Signs    Vitals:   06/25/18 2200 06/26/18 0100 06/26/18 0339 06/26/18 0720  BP: (!) 98/56   129/68  Pulse: 79   87  Resp: 18   18  Temp: 98.4 F (36.9 C)   98.2 F (36.8 C)  TempSrc: Oral   Oral  SpO2: 96% 91%  97%  Weight:   79.9 kg   Height:        Intake/Output Summary (Last 24 hours) at 06/26/2018 0948 Last data filed at 06/25/2018 2236 Gross per 24 hour  Intake 250 ml  Output 3301 ml  Net -3051 ml   Filed Weights   06/24/18 0500 06/25/18 0500 06/26/18 0339  Weight: 84.7 kg 79.9 kg 79.9 kg    Telemetry    Atrial fibrillation with 10 beats NSVT; rate controlled - Personally Reviewed   Physical Exam   GEN: Elderly; frail, NAD Neck: No JVD Cardiac: irregular, 2/6 systolic murmur; no DM Respiratory: Diminished BS bases; no wheeze GI: Mildly distended; s/p appendectomy; no masses MS: trace-1+ hip edema Neuro:   No focal findings   Labs    Chemistry Recent Labs  Lab 06/20/18 0856  06/23/18 0233 06/24/18 0222 06/25/18 0319 06/26/18 0219  NA 139   < > 140 140 140 138  K 3.9   < > 4.0 4.5 3.4* 3.3*  CL 103   < > 115* 115* 109 102  CO2 23   < > 18* 19* 25 29  GLUCOSE 103*   < > 178* 108* 110* 104*  BUN 14   < > 17 21 16 9   CREATININE 1.02   < > 0.96 0.84 0.84 0.67  CALCIUM 8.8*   < > 8.3* 8.3* 8.1* 8.0*  PROT 6.5  --  5.9*  --   --   --   ALBUMIN 3.8  --  2.8*  --   --   --   AST 29  --  30  --   --   --   ALT 12  --  16  --   --   --   ALKPHOS 40  --  33*  --   --   --   BILITOT 1.1  --  0.3  --   --   --  GFRNONAA >60   < > >60 >60 >60 >60  GFRAA >60   < > >60 >60 >60 >60  ANIONGAP 13   < > 7 6 6 7    < > = values in this interval not displayed.     Hematology Recent Labs  Lab 06/24/18 0222 06/25/18 0319 06/26/18 0219  WBC 10.6* 9.2 8.8  RBC 3.55* 3.91* 3.88*  HGB 10.6* 11.3* 11.6*  HCT 33.4* 36.3* 35.0*  MCV 94.1 92.8 90.2  MCH 29.9 28.9 29.9  MCHC 31.7 31.1 33.1  RDW 16.6* 16.5* 16.0*  PLT 143* 134* 149*    Patient Profile     85 y.o. male with past medical history of coronary artery disease, permanent atrial fibrillation, severe mitral regurgitation, aortic stenosis with prior TAVR March 2019 and now status post appendectomy with acute on chronic diastolic congestive heart failure.  Last echocardiogram May 2019 showed normal LV function, bioprosthetic aortic valve with trace aortic insufficiency, moderate mitral stenosis, severe mitral regurgitation and biatrial enlargement.  Assessment & Plan    1 acute on chronic diastolic congestive heart failure-patient remains volume overloaded but improving.  Continue lasix at present dose today. Follow renal function.    2 prior aortic valve replacement-patient is status post TAVR March 2019.  3 permanent atrial fibrillation-continue present medications for rate control.  Continue heparin and Coumadin until INR greater than  2.  4 status post appendectomy-Per general surgery.  5 hypertension-patient's blood pressure is controlled.  Continue present medications and follow.  6 history of severe mitral regurgitation-medical therapy.  Not a candidate for mitraclip.  7 hypokalemia-supplement    For questions or updates, please contact [place] Please consult [email or web address] for contact info under        Signed, Kirk Ruths, MD  06/26/2018, 9:48 AM

## 2018-06-26 NOTE — Progress Notes (Signed)
[facility] Progress Note  4 Days Post-Op  Subjective: CC-  Patient surprised that he is not having any abdominal pain this morning. Some nausea yesterday, none today. He had about 3 loose BMs yesterday. Appetite still suppressed. Patient's wife states that he spent a few hours in the chair yesterday.  Objective: Vital signs in last 24 hours: Temp:  [98 F (36.7 C)-98.4 F (36.9 C)] 98.2 F (36.8 C) (01/08 0720) Pulse Rate:  [79-91] 87 (01/08 0720) Resp:  [18] 18 (01/08 0720) BP: (98-130)/(56-80) 129/68 (01/08 0720) SpO2:  [91 %-97 %] 97 % (01/08 0720) Weight:  [79.9 kg] 79.9 kg (01/08 0339) Last BM Date: 06/25/18  Intake/Output from previous day: 01/07 0701 - 01/08 0700 In: 250 [P.O.:150; IV Piggyback:100] Out: 4401 [Urine:4400; Stool:1] Intake/Output this shift: No intake/output data recorded.  PE: Gen: Alert, NAD HEENT: EOM's intact, pupils equal and round Pulm:effort normal, diminished breath sounds bilateral bases, no wheezing Abd: Soft,mild distension, mild diffuse tenderness,+BS, multiple lap incisions cdi  Lab Results:  Recent Labs    06/25/18 0319 06/26/18 0219  WBC 9.2 8.8  HGB 11.3* 11.6*  HCT 36.3* 35.0*  PLT 134* 149*   BMET Recent Labs    06/25/18 0319 06/26/18 0219  NA 140 138  K 3.4* 3.3*  CL 109 102  CO2 25 29  GLUCOSE 110* 104*  BUN 16 9  CREATININE 0.84 0.67  CALCIUM 8.1* 8.0*   PT/INR Recent Labs    06/25/18 0319 06/26/18 0219  LABPROT 16.8* 20.9*  INR 1.37 1.83   CMP     Component Value Date/Time   NA 138 06/26/2018 0219   NA 142 10/12/2017 1214   K 3.3 (L) 06/26/2018 0219   CL 102 06/26/2018 0219   CO2 29 06/26/2018 0219   GLUCOSE 104 (H) 06/26/2018 0219   BUN 9 06/26/2018 0219   BUN 18 10/12/2017 1214   CREATININE 0.67 06/26/2018 0219   CREATININE 1.02 01/27/2016 0938   CALCIUM 8.0 (L) 06/26/2018 0219   PROT 5.9 (L) 06/23/2018 0233   ALBUMIN 2.8 (L) 06/23/2018 0233   AST 30 06/23/2018 0233   ALT 16 06/23/2018 0233   ALKPHOS 33 (L) 06/23/2018 0233   BILITOT 0.3 06/23/2018 0233   GFRNONAA >60 06/26/2018 0219   GFRNONAA 67 01/27/2016 0938   GFRAA >60 06/26/2018 0219   GFRAA 78 01/27/2016 0938   Lipase     Component Value Date/Time   LIPASE 30 06/20/2018 0856       Studies/Results: Dg Chest 2 View  Result Date: 06/24/2018 CLINICAL DATA:  Oxygen desaturation EXAM: CHEST - 2 VIEW COMPARISON:  06/20/2018, 04/23/2018 FINDINGS: Cardiomegaly with vascular congestion and moderate diffuse interstitial edema. Development of moderate bilateral pleural effusions. Valve replacement. Aortic atherosclerosis. No pneumothorax. Bibasilar left greater than right consolidation. IMPRESSION: 1. Moderate bilateral pleural effusions. 2. Cardiomegaly with vascular congestion and worsened pulmonary edema. 3. Superimposed atelectasis or pneumonia at the left greater than right lung base. Electronically Signed   By: Donavan Foil M.D.   On: 06/24/2018 19:50    Anti-infectives: Anti-infectives (From admission, onward)   Start     Dose/Rate Route Frequency Ordered Stop   06/22/18 1000  vancomycin (VANCOCIN) 1,500 mg in sodium chloride 0.9 % 500 mL IVPB  Status:  Discontinued     1,500 mg 250 mL/hr over 120 Minutes Intravenous Every 24 hours 06/21/18 2201 06/24/18 1050   06/21/18 1100  vancomycin (VANCOCIN) 1,500 mg in sodium chloride 0.9 % 500 mL IVPB  Status:  Discontinued     1,500 mg 250 mL/hr over 120 Minutes Intravenous Every 24 hours 06/20/18 1013 06/21/18 0818   06/21/18 1100  vancomycin (VANCOCIN) 1,250 mg in sodium chloride 0.9 % 250 mL IVPB  Status:  Discontinued     1,250 mg 166.7 mL/hr over 90 Minutes Intravenous Every 24 hours 06/21/18 0818 06/21/18 2201   06/20/18 2200  ceFEPIme (MAXIPIME) 2 g in sodium chloride 0.9 % 100 mL IVPB     2 g 200 mL/hr over 30 Minutes Intravenous Every 12 hours 06/20/18 1014     06/20/18 0915  aztreonam (AZACTAM) 2 g in sodium chloride 0.9 % 100 mL IVPB   Status:  Discontinued     2 g 200 mL/hr over 30 Minutes Intravenous  Once 06/20/18 0905 06/20/18 0909   06/20/18 0915  metroNIDAZOLE (FLAGYL) IVPB 500 mg     500 mg 100 mL/hr over 60 Minutes Intravenous Every 8 hours 06/20/18 0905     06/20/18 0915  vancomycin (VANCOCIN) IVPB 1000 mg/200 mL premix  Status:  Discontinued     1,000 mg 200 mL/hr over 60 Minutes Intravenous  Once 06/20/18 0905 06/20/18 0909   06/20/18 0915  ceFEPIme (MAXIPIME) 2 g in sodium chloride 0.9 % 100 mL IVPB     2 g 200 mL/hr over 30 Minutes Intravenous  Once 06/20/18 0909 06/20/18 1024   06/20/18 0915  vancomycin (VANCOCIN) 1,500 mg in sodium chloride 0.9 % 500 mL IVPB     1,500 mg 250 mL/hr over 120 Minutes Intravenous  Once 06/20/18 4128 06/20/18 1316       Assessment/Plan S/p TAVR CAD A fib on coumadin - INR 1.83 today HTN H/o MI H/O CVA SIRS, unclear etiology Thrombocytopenia- 134K>>149Ktoday DNR  RecurrentAcute appendicitis Laparoscopic appendectomy 06/22/18, Dr. Georganna Skeans - POD 4 - perforated appendicitis - needs 7 days antibiotics postop  FEN -IV fluids/Heart healthy/Boost VTE -heparin/coumadin ID: Vancomycin 06/20/17>>1/6, Cefepime 1/2 >> day#7, flagyl 1/2 >> day#7- Needs 7 days abx post op thru 06/29/18 Foley: out  Plan:Add probiotic. Replace potassium. Continue diet as tolerated. PT/OT and mobilize. Continue IV abx.   LOS: 5 days    Wellington [place] , [facility] 06/26/2018, 8:01 AM Pager: [phone] Mon 7:00 am -11:30 AM Tues-Fri 7:00 am-4:30 pm Sat-Sun 7:00 am-11:30 am

## 2018-06-27 ENCOUNTER — Telehealth: Payer: Self-pay | Admitting: Cardiology

## 2018-06-27 DIAGNOSIS — R0902 Hypoxemia: Secondary | ICD-10-CM

## 2018-06-27 LAB — PROTIME-INR
INR: 2.1
Prothrombin Time: 23.3 seconds — ABNORMAL HIGH (ref 11.4–15.2)

## 2018-06-27 LAB — BASIC METABOLIC PANEL
Anion gap: 8 (ref 5–15)
BUN: 13 mg/dL (ref 8–23)
CHLORIDE: 98 mmol/L (ref 98–111)
CO2: 31 mmol/L (ref 22–32)
Calcium: 8.4 mg/dL — ABNORMAL LOW (ref 8.9–10.3)
Creatinine, Ser: 0.69 mg/dL (ref 0.61–1.24)
GFR calc Af Amer: >60 mL/min (ref >60)
GFR calc non Af Amer: >60 mL/min (ref >60)
Glucose, Bld: 100 mg/dL — ABNORMAL HIGH (ref 70–99)
Potassium: 3.8 mmol/L (ref 3.5–5.1)
Sodium: 137 mmol/L (ref 135–145)

## 2018-06-27 LAB — CBC
HCT: 36.6 % — ABNORMAL LOW (ref 39.0–52.0)
HEMOGLOBIN: 11.8 g/dL — AB (ref 13.0–17.0)
MCH: 29.2 pg (ref 26.0–34.0)
MCHC: 32.2 g/dL (ref 30.0–36.0)
MCV: 90.6 fL (ref 80.0–100.0)
Platelets: 169 10*3/uL (ref 150–400)
RBC: 4.04 MIL/uL — ABNORMAL LOW (ref 4.22–5.81)
RDW: 16.3 % — ABNORMAL HIGH (ref 11.5–15.5)
WBC: 8.8 10*3/uL (ref 4.0–10.5)
nRBC: 0 % (ref 0.0–0.2)

## 2018-06-27 LAB — MAGNESIUM: Magnesium: 2 mg/dL (ref 1.7–2.4)

## 2018-06-27 LAB — HEPARIN LEVEL (UNFRACTIONATED): Heparin Unfractionated: 0.33 IU/mL (ref 0.30–0.70)

## 2018-06-27 MED ORDER — WARFARIN SODIUM 5 MG PO TABS
5.0000 mg | ORAL_TABLET | Freq: Once | ORAL | Status: AC
  Administered 2018-06-27: 5 mg via ORAL
  Filled 2018-06-27: qty 1

## 2018-06-27 MED ORDER — POTASSIUM CHLORIDE CRYS ER 10 MEQ PO TBCR
10.0000 meq | EXTENDED_RELEASE_TABLET | Freq: Every day | ORAL | Status: DC
  Administered 2018-06-28: 10 meq via ORAL
  Filled 2018-06-27: qty 1

## 2018-06-27 MED ORDER — PSYLLIUM 95 % PO PACK
1.0000 | PACK | Freq: Every day | ORAL | Status: DC
  Administered 2018-06-28: 1 via ORAL
  Filled 2018-06-27: qty 1

## 2018-06-27 MED ORDER — FUROSEMIDE 40 MG PO TABS
40.0000 mg | ORAL_TABLET | Freq: Every day | ORAL | Status: DC
  Administered 2018-06-27 – 2018-06-28 (×2): 40 mg via ORAL
  Filled 2018-06-27 (×2): qty 1

## 2018-06-27 MED ORDER — POTASSIUM CHLORIDE CRYS ER 20 MEQ PO TBCR
40.0000 meq | EXTENDED_RELEASE_TABLET | Freq: Once | ORAL | Status: AC
  Administered 2018-06-27: 40 meq via ORAL
  Filled 2018-06-27: qty 2

## 2018-06-27 NOTE — Clinical Social Work Note (Signed)
CSW spoke with pt's daughter via telephone. Pt's daughter went to [facility] yesterday to inquire about a bed. Pt's daughter states [facility] has a wait list right now however it would depend on when pt d/c if their would have a bed available. Pt's daughter states if pt can not get into [facility] then pt would stay with her at her home after d/c. CSW to continue to follow for anticipated d/c date.  [place], [place]

## 2018-06-27 NOTE — Progress Notes (Signed)
Progress Note  Patient Name: Wayne [name] Date of Encounter: 06/27/2018  Primary Cardiologist: Kirk Ruths, MD   Subjective   No CP or dyspnea  Inpatient Medications    Scheduled Meds: . acetaminophen  650 mg Oral Q6H  . dextromethorphan-guaiFENesin  1 tablet Oral BID  . digoxin  125 mcg Oral Daily  . docusate sodium  100 mg Oral BID  . feeding supplement  1 Container Oral BID BM  . finasteride  5 mg Oral QHS  . furosemide  40 mg Intravenous BID  . ipratropium-albuterol  3 mL Nebulization Once  . Melatonin  9 mg Oral QHS  . metoprolol tartrate  12.5 mg Oral BID  . polyethylene glycol  17 g Oral Daily  . saccharomyces boulardii  250 mg Oral BID  . warfarin  5 mg Oral ONCE-1800  . Warfarin - Pharmacist Dosing Inpatient   Does not apply q1800   Continuous Infusions: . sodium chloride 10 mL/hr at 06/23/18 1033  . ceFEPime (MAXIPIME) IV Stopped (06/27/18 0105)   PRN Meds: traMADol   Vital Signs    Vitals:   06/26/18 0720 06/26/18 1645 06/26/18 2142 06/27/18 0714  BP: 129/68 114/70 116/72 117/64  Pulse: 87 87 99 86  Resp: 18 18 12 18   Temp: 98.2 F (36.8 C) 98.4 F (36.9 C) 98.3 F (36.8 C) 98.5 F (36.9 C)  TempSrc: Oral Oral Oral Oral  SpO2: 97% 100% 97% 97%  Weight:      Height:        Intake/Output Summary (Last 24 hours) at 06/27/2018 0752 Last data filed at 06/27/2018 0600 Gross per 24 hour  Intake 1214 ml  Output 3426 ml  Net -2212 ml   Filed Weights   06/24/18 0500 06/25/18 0500 06/26/18 0339  Weight: 84.7 kg 79.9 kg 79.9 kg    Telemetry    Atrial fibrillation; rate controlled - Personally Reviewed   Physical Exam   GEN: Elderly; frail in bed Neck: No JVD, supple Cardiac: irregular Respiratory: Mildly diminished BS bases GI: Mildly distended; s/p appendectomy MS: no edema Neuro:  grossly intact   Labs    Chemistry Recent Labs  Lab 06/20/18 0856  06/23/18 0233  06/25/18 0319 06/26/18 0219 06/27/18 0244  NA 139   <  > 140   < > 140 138 137  K 3.9   < > 4.0   < > 3.4* 3.3* 3.8  CL 103   < > 115*   < > 109 102 98  CO2 23   < > 18*   < > 25 29 31   GLUCOSE 103*   < > 178*   < > 110* 104* 100*  BUN 14   < > 17   < > 16 9 13   CREATININE 1.02   < > 0.96   < > 0.84 0.67 0.69  CALCIUM 8.8*   < > 8.3*   < > 8.1* 8.0* 8.4*  PROT 6.5  --  5.9*  --   --   --   --   ALBUMIN 3.8  --  2.8*  --   --   --   --   AST 29  --  30  --   --   --   --   ALT 12  --  16  --   --   --   --   ALKPHOS 40  --  33*  --   --   --   --  BILITOT 1.1  --  0.3  --   --   --   --   GFRNONAA >60   < > >60   < > >60 >60 >60  GFRAA >60   < > >60   < > >60 >60 >60  ANIONGAP 13   < > 7   < > 6 7 8    < > = values in this interval not displayed.     Hematology Recent Labs  Lab 06/25/18 0319 06/26/18 0219 06/27/18 0244  WBC 9.2 8.8 8.8  RBC 3.91* 3.88* 4.04*  HGB 11.3* 11.6* 11.8*  HCT 36.3* 35.0* 36.6*  MCV 92.8 90.2 90.6  MCH 28.9 29.9 29.2  MCHC 31.1 33.1 32.2  RDW 16.5* 16.0* 16.3*  PLT 134* 149* 169    Patient Profile     85 y.o. male with past medical history of coronary artery disease, permanent atrial fibrillation, severe mitral regurgitation, aortic stenosis with prior TAVR March 2019 and now status post appendectomy with acute on chronic diastolic congestive heart failure.  Last echocardiogram May 2019 showed normal LV function, bioprosthetic aortic valve with trace aortic insufficiency, moderate mitral stenosis, severe mitral regurgitation and biatrial enlargement.  Assessment & Plan    1 acute on chronic diastolic congestive heart failure-volume status has improved.  I will change Lasix to home dose which is 40 mg daily.  2 prior aortic valve replacement-patient is status post TAVR March 2019.  3 permanent atrial fibrillation-plan to continue digoxin and metoprolol for rate control.  INR is now therapeutic.  Discontinue heparin.  4 status post appendectomy-Per general surgery.  5 hypertension-no change in  medications.  6 history of severe mitral regurgitation-medical therapy.  Not a candidate for mitraclip.  [facility] will sign off.   Medication Recommendations: Would resume preadmission cardiac medications at discharge. Other recommendations (labs, testing, etc): No further cardiac testing. Follow up as an outpatient: Follow-up with me 8 weeks following discharge.   For questions or updates, please contact [place] Please consult [email or web address] for contact info under        Signed, Kirk Ruths, MD  06/27/2018, 7:52 AM

## 2018-06-27 NOTE — Progress Notes (Signed)
Up to [facility] with assist.  Condom cath replaced. Stool loose and dark in color.  Wife remains at bedside.  O2 at @l  

## 2018-06-27 NOTE — Progress Notes (Signed)
[place] for Heparin / Warfarin Indication: atrial fibrillation  Allergies  Allergen Reactions  . Penicillins Rash and Other (See Comments)    PATIENT HAS HAD A PCN REACTION WITH IMMEDIATE RASH, FACIAL/TONGUE/THROAT SWELLING, SOB, OR LIGHTHEADEDNESS WITH HYPOTENSION:  #  #  #  YES  #  #  #   Has patient had a PCN reaction causing severe rash involving mucus membranes or skin necrosis: No Has patient had a PCN reaction that required hospitalization No Has patient had a PCN reaction occurring within the last 10 years: No If all of the above answers are "NO", then may proceed with Cephalosporin use.  . Prednisone Other (See Comments)    Wheezing  . Procaine Hcl Other (See Comments)    Passed out after being given this at dental appointment  . Levofloxacin Rash  . Oxycodone Other (See Comments)    constipation    Patient Measurements: Height: 5\' 10"  (177.8 cm) Weight: 176 lb 2.4 oz (79.9 kg) IBW/kg (Calculated) : 73 Heparin Dosing Weight: 83 kg  Vital Signs: Temp: 98.5 F (36.9 C) (01/09 0714) Temp Source: Oral (01/09 0714) BP: 117/64 (01/09 0714) Pulse Rate: 86 (01/09 0714)  Labs: Recent Labs    06/25/18 0319 06/26/18 0219 06/27/18 0244  HGB 11.3* 11.6* 11.8*  HCT 36.3* 35.0* 36.6*  PLT 134* 149* 169  LABPROT 16.8* 20.9* 23.3*  INR 1.37 1.83 2.10  HEPARINUNFRC 0.35 0.32 0.33  CREATININE 0.84 0.67 0.69    Estimated Creatinine Clearance: 68.4 mL/min (by C-G formula based on SCr of 0.69 mg/dL).   Assessment: 62 YOM s/p laparoscopic appendectomy on warfarin PTA for afib. Patient also has history of TAVR. Pharmacy has been consulted for warfarin dosing and heparin bridge. Received vitamin K 10 mg PO on 1/3 and 5 mg IV on 1/4.  Heparin level is therapeutic at 0.33 on 1200 units/hr. INR is therapeutic at 2.10. No bleeding noted, H&H and plt stable.   Patient s/p 6 day course of Flagyl from 1/2-1/8 which can increase sensitivity to  warfarin.  PTA warfarin: 5 mg daily exc 2.5 mg Tues/Fri  Goal of Therapy:  INR 2-3 Heparin level 0.3-0.7 units/ml Monitor platelets by anticoagulation protocol: Yes    Plan:  Discontinue heparin drip at 1200 units/hr Resume home dose warfarin 5 mg PO tonight Daily INR Monitor for s/sx of bleeding   Azzie Roup D PGY1 Pharmacy Resident  Phone [phone] Please use AMION for clinical pharmacists numbers  06/27/2018      7:39 AM

## 2018-06-27 NOTE — Telephone Encounter (Signed)
Called patient and LVM to call and schedule followup with Dr. Stanford Breed.

## 2018-06-27 NOTE — Progress Notes (Signed)
SATURATION QUALIFICATIONS: (This note is used to comply with regulatory documentation for home oxygen)  Patient Saturations on Room Air at Rest = 94%  Patient Saturations on Room Air while Ambulating = 90%   Please briefly explain why patient needs home oxygen:  Pt will not require home O2, sats above 87% while ambulating  [place]

## 2018-06-27 NOTE — Progress Notes (Addendum)
Family Medicine Teaching Service Daily Progress Note Intern Pager: [phone]  Patient name: Wayne [name] Medical record number: [id number] Date of birth: 1930/01/15 Age: 85 y.o. Gender: male  Primary Care Provider: [name], DO Consultants: Cardiology, General Surgery Code Status: DNR  Pt Overview and Major Events to Date:  1/2: Admitted for sepsis d/t appendicitis 1/3: GenSurg plans Sx 1/4 1/4: Laproscopic appendectomy  Assessment and Plan: Wayne [name] is a 85 y.o. male presenting with weakness and fall. PMH is significant for A. fib on chronic anticoagulation, aortic valve replacement with porcine valve, CAD, CVD, BPH, MI s/p PCI, CVA, HFpEF, and hypertension.  Sepsis secondary to acute appendicitis: 5 days S/P Appendectomy On Cefepime, day 8 of total treatment.  D/c'ed flagyl yesterday as anaerobic coverage was adequate.  WBC 8.8, afebrile, tachycardic to 118 at 1900, otherwise VSS.  Pain this AM well-controlled. Has not required prns of tramadol. - General surgery consulted, appreciate recommendations - Continue cefepime, per surgery needs 7 days Abx post op through 1/11 - s/p flagyl x 6 days - S/P Vanc x 5 days - Telemetry - regular diet - pain control: cont sch tylenol, d/c tramadol - IVF at [place] O2 requirement: Likely 2/2 CHF Received IV Lasix 40 BID yesterday.  UOP 3.4 L.  Cr this AM with slight bump from 0.67 to 0.69.  O2 on 1L, weaned to RA while in room, able to maintain O2 sats on RA. - wean O2 as tolerated, place on continuous pulse ox -Diuresis per cardiology, placed back on home Lasix 40 mg daily -Cardiology following appreciate recommendations  Hypokalemia: K goal should be 4.  Magnesium 2.0.  K this a.m. 3.8. -K-Dur 40 mEq once - f/u BMP - will re-start home K-dur 2mEq QD at home Lasix dose   Atrial fibrillation  HFpEF: Chronic.   History of TAVR in March 2019.  On Coumadin with INR goal of 2-3.  Following bridge of warfarin  with heparin, INR today 2.1.  Heart rate at 1900 last night was 118, otherwise heart rate in the 80s to 90s. - Telemetry -Cardiology following, appreciate recommendations - Continue home digoxin - cont home lopressor 12.5mg  BID, consider increasing, although will hold off for cardiology recommendations - cont Lasix per cards, daily weights and strict I&O's - Holding home ASA, restart prior to discharge -Warfarin per pharmacy - will need INR appointment for next week  Normocytic anemia: Chronic Patient has a history of chronic anemia.   Globin stable at 11.8. -Continue to monitor CBC  Elbow Injury S/P Fall: Elbow pain following fall prior to presentation.  No signs of fracture on plain films.  - RICE therapy    Hx CVA with balance deficit: Chronic, stable.  Cerebellar stroke 2007.  On ASA and coumadin at home.  CT head negative for acute abnormality.  Functioning well and even drove a car this week.  Wife notes patient has been using a walker recently. - PT/OT consulted, appreciate recommendations, now recommending SNF - Social work consulted for SNF placement  Chronic Constipation: Chronic, stable.   On colace and miralax at home prn.  3 stools yesterday. -MiraLAX daily -Florastor twice daily  BPH: On alfuzosin and finasteride at home.   - Continue home finasteride 5 mg daily - holding home alfuzosin 10 mg daily, with decrease in diuresis BP should tolerate re-addition  FEN/GI: Heart healthy diet PPx: Heparin IV  Disposition: Patient is medically cleared for discharge to SNF, awaiting placement  Subjective:  Patient feeling  well this AM.  Pain well-controlled.  No complaints.  Objective: Temp:  [98.2 F (36.8 C)-98.4 F (36.9 C)] 98.3 F (36.8 C) (01/08 2142) Pulse Rate:  [87-99] 99 (01/08 2142) Resp:  [12-18] 12 (01/08 2142) BP: (114-129)/(68-72) 116/72 (01/08 2142) SpO2:  [97 %-100 %] 97 % (01/08 2142)  Physical Exam: General: 85 y.o. male in NAD, sleeping  comfortably, arouses to stimuli Cardio: irregularly irregular Lungs: CTAB, no wheezing, no rhonchi, no crackles, no increased work of breathing on room air Abdomen: Soft, minimally tender to palpation, positive bowel sounds Skin: warm and dry Extremities: No edema    Laboratory: Recent Labs  Lab 06/25/18 0319 06/26/18 0219 06/27/18 0244  WBC 9.2 8.8 8.8  HGB 11.3* 11.6* 11.8*  HCT 36.3* 35.0* 36.6*  PLT 134* 149* 169   Recent Labs  Lab 06/20/18 0856  06/23/18 0233  06/25/18 0319 06/26/18 0219 06/27/18 0244  NA 139   < > 140   < > 140 138 137  K 3.9   < > 4.0   < > 3.4* 3.3* 3.8  CL 103   < > 115*   < > 109 102 98  CO2 23   < > 18*   < > 25 29 31   BUN 14   < > 17   < > 16 9 13   CREATININE 1.02   < > 0.96   < > 0.84 0.67 0.69  CALCIUM 8.8*   < > 8.3*   < > 8.1* 8.0* 8.4*  PROT 6.5  --  5.9*  --   --   --   --   BILITOT 1.1  --  0.3  --   --   --   --   ALKPHOS 40  --  33*  --   --   --   --   ALT 12  --  16  --   --   --   --   AST 29  --  30  --   --   --   --   GLUCOSE 103*   < > 178*   < > 110* 104* 100*   < > = values in this interval not displayed.    Imaging/Diagnostic Tests: No results found.    Meccariello, Bernita Raisin, DO 06/27/2018, 7:03 AM PGY-1, [place] Intern pager: [phone], text pages welcome

## 2018-06-27 NOTE — Progress Notes (Signed)
Physical Therapy Treatment Patient Details Name: Wayne [name] MRN: [id number] DOB: 20-Jul-1929 Today's Date: 06/27/2018    History of Present Illness Wayne [name] is a 85 y.o. male with a hx of CAD, chronic atrial fibrillation on coumadin, severe MR medically managed, severe AS s/p TAVR 08/2017, and pulmonary HTN.  Admit for Sepsis, presumed to be due to appendicitis.     PT Comments    Patient progressing well with therapy, increasing ambulation distance to 60' on RA, as well as level of support needed for transfers. Wife present confused over d/c dispo. If patient is able to return to his daughters house with 24/7 physical assistance, HHPT appropriate. If patient unable to return there, SNF will be needed as he still requires min A which I believe  Will be too mcuh to ask of wife. VSS on RA today.     Follow Up Recommendations  Home health PT;Supervision/Assistance - 24 hour(if able to return to childrens home. if not, SNF)     Equipment Recommendations  None recommended by PT    Recommendations for Other Services       Precautions / Restrictions Precautions Precautions: Fall;Other (comment) Precaution Comments: watch SpO2  Restrictions Weight Bearing Restrictions: No    Mobility  Bed Mobility Overal bed mobility: Needs Assistance Bed Mobility: Supine to Sit     Supine to sit: Min assist     General bed mobility comments: min A  Transfers Overall transfer level: Needs assistance Equipment used: Rolling walker (2 wheeled) Transfers: Sit to/from [facility] Sit to Stand: Min assist Stand pivot transfers: Min assist       General transfer comment: improving independence with standing transfer now min A of 1 person , cues still needed for hand placement. poor cary over   Ambulation/Gait Ambulation/Gait assistance: Min Gaffer (Feet): 60 Feet Assistive device: Rolling walker (2 wheeled) Gait  Pattern/deviations: Step-to pattern;Step-through pattern Gait velocity: decreased   General Gait Details: pt increased distance into hallway today, SpO2 90% ON RA, DOE 1/4, no overt LOB but requires stand by assist at best    [employer] Mobility    Modified Rankin (Stroke Patients Only)       Balance Overall balance assessment: Needs assistance Sitting-balance support: Feet supported;Bilateral upper extremity supported Sitting balance-Leahy Scale: Fair     Standing balance support: Bilateral upper extremity supported;During functional activity Standing balance-Leahy Scale: Poor Standing balance comment: relies on UE support                            Cognition Arousal/Alertness: Awake/alert Behavior During Therapy: WFL for tasks assessed/performed                           Following Commands: Follows multi-step commands consistently              Exercises      General Comments        Pertinent Vitals/Pain Pain Assessment: Faces Faces Pain Scale: Hurts little more Pain Location: abdomen Pain Descriptors / Indicators: Aching;Grimacing;Guarding Pain Intervention(s): Limited activity within patient's tolerance;Monitored during session    Home Living                      Prior Function            PT Goals (current  goals can now be found in the care plan section) Acute Rehab PT Goals Patient Stated Goal: to go home PT Goal Formulation: With patient/family Time For Goal Achievement: 07/05/18 Potential to Achieve Goals: Fair Progress towards PT goals: Progressing toward goals    Frequency    Min 3X/week      PT Plan Discharge plan needs to be updated    Co-evaluation              AM-PAC PT "6 Clicks" Mobility   Outcome Measure  Help needed turning from your back to your side while in a flat bed without using bedrails?: A Little Help needed moving from lying on your back to sitting  on the side of a flat bed without using bedrails?: A Little Help needed moving to and from a bed to a chair (including a wheelchair)?: A Little Help needed standing up from a chair using your arms (e.g., wheelchair or bedside chair)?: A Little Help needed to walk in hospital room?: A Little Help needed climbing 3-5 steps with a railing? : A Lot 6 Click Score: 17    End of Session Equipment Utilized During Treatment: Oxygen Activity Tolerance: Patient limited by pain Patient left: with chair alarm set;with family/visitor present;in bed Nurse Communication: Mobility status;Other (comment) PT Visit Diagnosis: Muscle weakness (generalized) (M62.81);Pain;Unsteadiness on feet (R26.81);Difficulty in walking, not elsewhere classified (R26.2);Other abnormalities of gait and mobility (R26.89)     Time: 1833-5825 PT Time Calculation (min) (ACUTE ONLY): 28 min  Charges:  $Gait Training: 23-37 mins                     Reinaldo Berber, PT, DPT Acute Rehabilitation Services Pager: [phone] Office: [phone]     Reinaldo Berber 06/27/2018, 2:09 PM

## 2018-06-27 NOTE — Progress Notes (Signed)
RN spoke with pt's wife regarding DC and wife feels that the pt is not ready to be DCd. RN paged MD and MD said she would come speak to pt and wife. Pt cant be DCd to [facility] as of right now, due to no open beds. Pt wants to be DCd home with home health to daughters house. CSW and MD aware. Pt tentatively [employer] tomorrow. Will check amb sats today.  Riley Kill RN

## 2018-06-27 NOTE — Telephone Encounter (Signed)
Pt is not on our service; on hospitalist service Wayne [name]

## 2018-06-27 NOTE — Progress Notes (Signed)
[facility] Progress Note  5 Days Post-Op  Subjective: CC-  Patient is very tired this morning, wife states that he's not a morning person. States that he was still having abdominal pain yesterday, but last night started improving. Worked well with therapies and ambulated further than he has since admission. No n/v. He had about 3 loose stools yesterday. Appetite improving, he ate about 3/4 of his dinner. Has not eaten breakfast yet today.  Objective: Vital signs in last 24 hours: Temp:  [98.3 F (36.8 C)-98.5 F (36.9 C)] 98.5 F (36.9 C) (01/09 0714) Pulse Rate:  [86-99] 86 (01/09 0714) Resp:  [12-18] 18 (01/09 0714) BP: (114-117)/(64-72) 117/64 (01/09 0714) SpO2:  [97 %-100 %] 97 % (01/09 0714) Last BM Date: 06/27/18  Intake/Output from previous day: 01/08 0701 - 01/09 0700 In: 1214 [P.O.:780; I.V.:134; IV Piggyback:300] Out: 3426 [Urine:3425; Stool:1] Intake/Output this shift: No intake/output data recorded.  PE: Gen: Alert but drowsy, arousable, NAD HEENT: EOM's intact, pupils equal and round Pulm:effort normal Abd: Soft,nondistended, nontender,+BS, multiple lap incisions cdi  Lab Results:  Recent Labs    06/26/18 0219 06/27/18 0244  WBC 8.8 8.8  HGB 11.6* 11.8*  HCT 35.0* 36.6*  PLT 149* 169   BMET Recent Labs    06/26/18 0219 06/27/18 0244  NA 138 137  K 3.3* 3.8  CL 102 98  CO2 29 31  GLUCOSE 104* 100*  BUN 9 13  CREATININE 0.67 0.69  CALCIUM 8.0* 8.4*   PT/INR Recent Labs    06/26/18 0219 06/27/18 0244  LABPROT 20.9* 23.3*  INR 1.83 2.10   CMP     Component Value Date/Time   NA 137 06/27/2018 0244   NA 142 10/12/2017 1214   K 3.8 06/27/2018 0244   CL 98 06/27/2018 0244   CO2 31 06/27/2018 0244   GLUCOSE 100 (H) 06/27/2018 0244   BUN 13 06/27/2018 0244   BUN 18 10/12/2017 1214   CREATININE 0.69 06/27/2018 0244   CREATININE 1.02 01/27/2016 0938   CALCIUM 8.4 (L) 06/27/2018 0244   PROT 5.9 (L) 06/23/2018 0233   ALBUMIN 2.8 (L) 06/23/2018 0233   AST 30 06/23/2018 0233   ALT 16 06/23/2018 0233   ALKPHOS 33 (L) 06/23/2018 0233   BILITOT 0.3 06/23/2018 0233   GFRNONAA >60 06/27/2018 0244   GFRNONAA 67 01/27/2016 0938   GFRAA >60 06/27/2018 0244   GFRAA 78 01/27/2016 0938   Lipase     Component Value Date/Time   LIPASE 30 06/20/2018 0856       Studies/Results: No results found.  Anti-infectives: Anti-infectives (From admission, onward)   Start     Dose/Rate Route Frequency Ordered Stop   06/22/18 1000  vancomycin (VANCOCIN) 1,500 mg in sodium chloride 0.9 % 500 mL IVPB  Status:  Discontinued     1,500 mg 250 mL/hr over 120 Minutes Intravenous Every 24 hours 06/21/18 2201 06/24/18 1050   06/21/18 1100  vancomycin (VANCOCIN) 1,500 mg in sodium chloride 0.9 % 500 mL IVPB  Status:  Discontinued     1,500 mg 250 mL/hr over 120 Minutes Intravenous Every 24 hours 06/20/18 1013 06/21/18 0818   06/21/18 1100  vancomycin (VANCOCIN) 1,250 mg in sodium chloride 0.9 % 250 mL IVPB  Status:  Discontinued     1,250 mg 166.7 mL/hr over 90 Minutes Intravenous Every 24 hours 06/21/18 0818 06/21/18 2201   06/20/18 2200  ceFEPIme (MAXIPIME) 2 g in sodium chloride 0.9 % 100 mL IVPB  2 g 200 mL/hr over 30 Minutes Intravenous Every 12 hours 06/20/18 1014     06/20/18 0915  aztreonam (AZACTAM) 2 g in sodium chloride 0.9 % 100 mL IVPB  Status:  Discontinued     2 g 200 mL/hr over 30 Minutes Intravenous  Once 06/20/18 0905 06/20/18 0909   06/20/18 0915  metroNIDAZOLE (FLAGYL) IVPB 500 mg  Status:  Discontinued     500 mg 100 mL/hr over 60 Minutes Intravenous Every 8 hours 06/20/18 0905 06/26/18 1335   06/20/18 0915  vancomycin (VANCOCIN) IVPB 1000 mg/200 mL premix  Status:  Discontinued     1,000 mg 200 mL/hr over 60 Minutes Intravenous  Once 06/20/18 0905 06/20/18 0909   06/20/18 0915  ceFEPIme (MAXIPIME) 2 g in sodium chloride 0.9 % 100 mL IVPB     2 g 200 mL/hr over 30 Minutes Intravenous  Once  06/20/18 0909 06/20/18 1024   06/20/18 0915  vancomycin (VANCOCIN) 1,500 mg in sodium chloride 0.9 % 500 mL IVPB     1,500 mg 250 mL/hr over 120 Minutes Intravenous  Once 06/20/18 0909 06/20/18 1316       Assessment/Plan S/p TAVR CAD A fib on coumadin - INR 2.1 today, therapeutic, heparin stopped HTN H/o MI H/O CVA SIRS, unclear etiology Thrombocytopenia- 169Ktoday, improving DNR  RecurrentAcute appendicitis Laparoscopic appendectomy 06/22/18, Dr. Georganna Skeans - POD 5 - perforated appendicitis - needs 7 days antibiotics postop  FEN -IV fluids/Heart healthy/Boost VTE -heparin/coumadin ID: Vancomycin 06/20/17>>1/6, Cefepime 1/2 >> day#8, flagyl 1/2 >> 1/8- Needs 7 days abx post op thru 06/29/18 Foley: out  Plan:Patient doing well from surgical standpoint. D/c miralax and add metamucil. Continue diet as tolerated. PT/OT and mobilize. Continue IV abx during admission, switch to PO at discharge if he does not complete full 7 days here.   LOS: 6 days    Wellington Hampshire , [facility] 06/27/2018, 8:52 AM Pager: [phone] Mon 7:00 am -11:30 AM Tues-Fri 7:00 am-4:30 pm Sat-Sun 7:00 am-11:30 am

## 2018-06-27 NOTE — Telephone Encounter (Signed)
Spoke with pt, she has a lot of concerns concerning the patient being discharged today. She feels he is too weak to go home and does not understand why discharge is being considered. She is on her way to the hospital and wants dr Stanford Breed to be aware of her concerns. Will forward for dr Stanford Breed review

## 2018-06-27 NOTE — Care Management Note (Signed)
Case Management Note  Patient Details  Name: Wayne [name] MRN: [id number] Date of Birth: 01-18-1930  Subjective/Objective:    Patient for dc tomorrow to daughters Mercie Eon, home at [address] [place] [address] phone [phone]. Daughter states they do not need HHOT they just want HHPT.  NCM gave Medicare.gov list to wife, and she states they always use [facility] and this is who she wants. Referral given to [facility] with [facility] for HHPT.  Soc will begin 24-48 hrs post dc.                   Action/Plan: DC home when ready.   Expected Discharge Date:                  Expected Discharge Plan:  [place]  In-House Referral:     Discharge planning Services  CM Consult  Post Acute Care Choice:  Home Health Choice offered to:  Spouse  DME Arranged:    DME Agency:     HH Arranged:  PT [place]:  [place]  Status of Service:  Completed, signed off  If discussed at [place] of Stay Meetings, dates discussed:    Additional Comments:  Zenon Mayo, RN 06/27/2018, 4:00 PM

## 2018-06-27 NOTE — Telephone Encounter (Signed)
Left message for patient dtr of dr Jacalyn Lefevre recommendations.

## 2018-06-27 NOTE — Progress Notes (Signed)
RN spoke with daughter, Eustaquio Maize, regarding pt's DC. Daughter is okay with patient being [facility] tomorrow and will be transporting him to her house. Will speak with case management about setting up home health. Daughter had concerns about pt possibly requiring O2 at DC, and RN explained amb sat being above 90% and that pt is no longer requiring O2. Daughter had no further questions.  Riley Kill RN

## 2018-06-27 NOTE — Telephone Encounter (Signed)
New Message:    Please call Wayne [name], pt is in the hospital. She does not think  Pt should be discharged today.

## 2018-06-27 NOTE — Clinical Social Work Note (Addendum)
CSW received a call from resident and pt is medicall stable for d/c. CSW spoke with pt's daughter and the plan will be for pt to go home with daughter. Resident updated. Clinical Social Worker will sign off for now as social work intervention is no longer needed. Please consult [place] again if new need arises.  12:47 CSW received a call from pt's daughter with pt's mother also on the line stating the other staff have told pt's spouse that there was no d/c in computer, that is correct however, per resident the plan is to do the d/c today-- confirmed again with resident. Pt's daughter requesting a call from Resident. Resident notified.   Shelton Silvas A Chanti Golubski 06/27/2018

## 2018-06-28 LAB — CBC
HCT: 36.2 % — ABNORMAL LOW (ref 39.0–52.0)
HEMOGLOBIN: 11.7 g/dL — AB (ref 13.0–17.0)
MCH: 29.3 pg (ref 26.0–34.0)
MCHC: 32.3 g/dL (ref 30.0–36.0)
MCV: 90.5 fL (ref 80.0–100.0)
Platelets: 184 10*3/uL (ref 150–400)
RBC: 4 MIL/uL — ABNORMAL LOW (ref 4.22–5.81)
RDW: 16.4 % — ABNORMAL HIGH (ref 11.5–15.5)
WBC: 7.8 10*3/uL (ref 4.0–10.5)
nRBC: 0 % (ref 0.0–0.2)

## 2018-06-28 LAB — PROTIME-INR
INR: 2.25
Prothrombin Time: 24.6 seconds — ABNORMAL HIGH (ref 11.4–15.2)

## 2018-06-28 LAB — BASIC METABOLIC PANEL
Anion gap: 10 (ref 5–15)
BUN: 11 mg/dL (ref 8–23)
CO2: 28 mmol/L (ref 22–32)
CREATININE: 0.81 mg/dL (ref 0.61–1.24)
Calcium: 8.5 mg/dL — ABNORMAL LOW (ref 8.9–10.3)
Chloride: 97 mmol/L — ABNORMAL LOW (ref 98–111)
GFR calc non Af Amer: >60 mL/min (ref >60)
Glucose, Bld: 98 mg/dL (ref 70–99)
Potassium: 3.9 mmol/L (ref 3.5–5.1)
Sodium: 135 mmol/L (ref 135–145)

## 2018-06-28 MED ORDER — AMOXICILLIN-POT CLAVULANATE 875-125 MG PO TABS: 1.0000 | ORAL_TABLET | Freq: Two times a day (BID) | ORAL | Status: DC

## 2018-06-28 MED ORDER — SACCHAROMYCES BOULARDII 250 MG PO CAPS: 250.0000 mg | ORAL_CAPSULE | Freq: Two times a day (BID) | ORAL | 0 refills | Status: DC

## 2018-06-28 MED ORDER — ASPIRIN 81 MG PO CHEW
81.0000 mg | CHEWABLE_TABLET | Freq: Every day | ORAL | Status: DC
  Administered 2018-06-28: 81 mg via ORAL
  Filled 2018-06-28: qty 1

## 2018-06-28 MED ORDER — CEFDINIR 300 MG PO CAPS
300.0000 mg | ORAL_CAPSULE | Freq: Two times a day (BID) | ORAL | Status: DC
  Filled 2018-06-28: qty 1

## 2018-06-28 MED ORDER — POLYETHYLENE GLYCOL 3350 17 G PO PACK: 17.0000 g | PACK | Freq: Every day | ORAL | Status: AC | PRN

## 2018-06-28 MED ORDER — FUROSEMIDE 40 MG PO TABS: 40.0000 mg | ORAL_TABLET | Freq: Every day | ORAL | 0 refills | Status: DC

## 2018-06-28 MED ORDER — CEFDINIR 300 MG PO CAPS: 300.0000 mg | ORAL_CAPSULE | Freq: Two times a day (BID) | ORAL | 0 refills | Status: AC

## 2018-06-28 MED ORDER — METOPROLOL TARTRATE 25 MG PO TABS: 12.5000 mg | ORAL_TABLET | Freq: Two times a day (BID) | ORAL | 0 refills | Status: DC

## 2018-06-28 MED ORDER — WARFARIN SODIUM 2.5 MG PO TABS: 2.5000 mg | ORAL_TABLET | Freq: Once | ORAL | Status: DC

## 2018-06-28 NOTE — Progress Notes (Signed)
Family Medicine Teaching Service Daily Progress Note Intern Pager: [phone]  Patient name: Wayne [name] Medical record number: [id number] Date of birth: 02/13/1930 Age: 85 y.o. Gender: male  Primary Care Provider: [name], DO Consultants: Cardiology, General Surgery Code Status: DNR  Pt Overview and Major Events to Date:  1/2: Admitted for sepsis d/t appendicitis 1/3: GenSurg plans Sx 1/4 1/4: Laproscopic appendectomy  Assessment and Plan: Tell Rozelle is a 85 y.o. male presenting with weakness and fall. PMH is significant for A. fib on chronic anticoagulation, aortic valve replacement with porcine valve, CAD, CVD, BPH, MI s/p PCI, CVA, HFpEF, and hypertension.  Sepsis secondary to acute appendicitis: 6 days S/P Appendectomy On Cefepime, day 9 of total treatment.  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  Will make sure surgery is aware and have patient follow up with PCP as outpatient and consider oncology referral.  WBC 7.8 this AM, has remained afebrile.  Pain well controlled this morning.  Patient tolerating a p.o. diet. - General surgery consulted, appreciate recommendations - Continue cefepime, per surgery needs 7 days Abx post op through 1/11 - s/p flagyl x 6 days - S/P Vanc x 5 days - Telemetry - regular diet - pain control: cont sch tylenol, d/c tramadol - [facility]F at [facility] - consider oncology referral as outpatient  New O2 requirement: Likely 2/2 CHF Changed back to home Lasix yesterday and cards signed off.  UOP 600.  Patient was able to wean to RA and remained on RA overnight.  O2 sats while ambulating >90%.  This AM on room air and breathing comfortably. -cont home Lasix 40 QD -Cardiology signed off, follow up as outpatient in 8 weeks  Hypokalemia: K3.9 this morning.  Was restarted on home Lasix yesterday, therefore will restart K-Dur 10 mEq daily today. - f/u BMP - will re-start home K-dur 62mEq QD at home  Lasix dose   Atrial fibrillation  HFpEF: Chronic.   History of TAVR in March 2019.  On Coumadin with INR goal of 2-3.  Patient finished bridging with heparin yesterday.  INR remains therapeutic at 2.25. - Telemetry -Cardiology following, appreciate recommendations - Continue home digoxin - cont home lopressor 12.5mg  BID, consider increasing, although will hold off for cardiology recommendations - cont Lasix per cards, daily weights and strict I&O's - Restart home aspirin -Warfarin per pharmacy, will send home on home dose -That will INR appointment for next week  Normocytic anemia: Chronic Patient has a history of chronic anemia.   Hemoglobin stable today at 11.7. -Continue to monitor CBC  Elbow Injury S/P Fall: Elbow pain following fall prior to presentation.  No signs of fracture on plain films.  - RICE therapy    Hx CVA with balance deficit: Chronic, stable.  Cerebellar stroke 2007.  On ASA and coumadin at home.  CT head negative for acute abnormality.  Functioning well and even drove a car this week.  Wife notes patient has been using a walker recently. - PT/OT consulted, appreciate recommendations, now recommending SNF -Family only wanting 1 SNF, which will not have bed available until next week.  Patient amiable to home with home health.  Chronic Constipation: Chronic, stable.   On colace and miralax at home prn.   -Metamucil daily -Florastor twice daily  BPH: On alfuzosin and finasteride at home.   - Continue home finasteride 5 mg daily -Restart home alfuzosin on discharge  FEN/GI: Heart healthy diet PPx: Heparin IV  Disposition: Patient is medically cleared  for discharge, family now planning for home with home health, care management consulted and face-to-face placed  Subjective:  Patient has no complaints this morning.  States that he is breathing comfortably.  Denies chest pain.  Denies abdominal pain.  States that he is comfortable going home  today.  Objective: Temp:  [97.8 F (36.6 C)-98.4 F (36.9 C)] 98.2 F (36.8 C) (01/10 0724) Pulse Rate:  [80-90] 87 (01/10 0724) Resp:  [16-18] 18 (01/09 2303) BP: (119-125)/(70-85) 125/70 (01/10 0724) SpO2:  [89 %-95 %] 91 % (01/10 0724) Weight:  [77.9 kg] 77.9 kg (01/10 0300)  Physical Exam: General: 85 y.o. male in NAD, sitting up eating breakfast Cardio: Irregularly irregular Lungs: CTAB, no wheezing, no rhonchi, no crackles, no increased work of breathing, on room air Abdomen: Soft, non-tender to palpation, positive bowel sounds Skin: warm and dry Extremities: No edema     Laboratory: Recent Labs  Lab 06/26/18 0219 06/27/18 0244 06/28/18 0309  WBC 8.8 8.8 7.8  HGB 11.6* 11.8* 11.7*  HCT 35.0* 36.6* 36.2*  PLT 149* 169 184   Recent Labs  Lab 06/23/18 0233  06/26/18 0219 06/27/18 0244 06/28/18 0309  NA 140   < > 138 137 135  K 4.0   < > 3.3* 3.8 3.9  CL 115*   < > 102 98 97*  CO2 18*   < > 29 31 28   BUN 17   < > 9 13 11   CREATININE 0.96   < > 0.67 0.69 0.81  CALCIUM 8.3*   < > 8.0* 8.4* 8.5*  PROT 5.9*  --   --   --   --   BILITOT 0.3  --   --   --   --   ALKPHOS 33*  --   --   --   --   ALT 16  --   --   --   --   AST 30  --   --   --   --   GLUCOSE 178*   < > 104* 100* 98   < > = values in this interval not displayed.    Imaging/Diagnostic Tests: No results found.    [place], DO 06/28/2018, 7:36 AM PGY-1, [place] Intern pager: [phone], text pages welcome

## 2018-06-28 NOTE — Discharge Instructions (Signed)
Follow up with your regular doctor in 1 week.  Go to your INR appointment on Monday.  Continue to take your coumadin like normal.  Continue to take your antibiotic for one more day.  [facility], [facility]  Please arrive at least 30 min before your appointment to complete your check in paperwork.  If you are unable to arrive 30 min prior to your appointment time we may have to cancel or reschedule you. LAPAROSCOPIC SURGERY: POST OP INSTRUCTIONS Always review your discharge instruction sheet given to you by the facility where your surgery was performed. IF YOU HAVE DISABILITY OR FAMILY LEAVE FORMS, YOU MUST BRING THEM TO THE OFFICE FOR PROCESSING.   DO NOT GIVE THEM TO YOUR DOCTOR.  PAIN CONTROL  1. First take acetaminophen (Tylenol) AND/or ibuprofen (Advil) to control your pain after surgery.  Follow directions on package.  Taking acetaminophen (Tylenol) and/or ibuprofen (Advil) regularly after surgery will help to control your pain and lower the amount of prescription pain medication you may need.  You should not take more than 4,000 mg (4 grams) of acetaminophen (Tylenol) in 24 hours.  You should not take ibuprofen (Advil), aleve, motrin, naprosyn or other NSAIDS if you have a history of stomach ulcers or chronic kidney disease.  2. A prescription for pain medication may be given to you upon discharge.  Take your pain medication as prescribed, if you still have uncontrolled pain after taking acetaminophen (Tylenol) or ibuprofen (Advil). 3. Use ice packs to help control pain. 4. If you need a refill on your pain medication, please contact your pharmacy.  They will contact our office to request authorization. Prescriptions will not be filled after 5pm or on week-ends.  HOME MEDICATIONS 5. Take your usually prescribed medications unless otherwise directed.  DIET 6. You should follow a light diet the first few days after arrival home.  Be sure to include lots of fluids daily. Avoid  fatty, fried foods.   CONSTIPATION 7. It is common to experience some constipation after surgery and if you are taking pain medication.  Increasing fluid intake and taking a stool softener (such as Colace) will usually help or prevent this problem from occurring.  A mild laxative (Milk of Magnesia or Miralax) should be taken according to package instructions if there are no bowel movements after 48 hours.  WOUND/INCISION CARE 8. Most patients will experience some swelling and bruising in the area of the incisions.  Ice packs will help.  Swelling and bruising can take several days to resolve.  9. Unless discharge instructions indicate otherwise, follow guidelines below  a. STERI-STRIPS - you may remove your outer bandages 48 hours after surgery, and you may shower at that time.  You have steri-strips (small skin tapes) in place directly over the incision.  These strips should be left on the skin for 7-10 days.   b. DERMABOND/SKIN GLUE - you may shower in 24 hours.  The glue will flake off over the next 2-3 weeks. 10. Any sutures or staples will be removed at the office during your follow-up visit.  ACTIVITIES 11. You may resume regular (light) daily activities beginning the next day--such as daily self-care, walking, climbing stairs--gradually increasing activities as tolerated.  You may have sexual intercourse when it is comfortable.  Refrain from any heavy lifting or straining until approved by your doctor. a. You may drive when you are no longer taking prescription pain medication, you can comfortably wear a seatbelt, and you can safely maneuver  your car and apply brakes.  FOLLOW-UP 12. You should see your doctor in the office for a follow-up appointment approximately 2-3 weeks after your surgery.  You should have been given your post-op/follow-up appointment when your surgery was scheduled.  If you did not receive a post-op/follow-up appointment, make sure that you call for this appointment  within a day or two after you arrive home to insure a convenient appointment time.  OTHER INSTRUCTIONS  WHEN TO CALL YOUR DOCTOR: 1. Fever over 101.0 2. Inability to urinate 3. Continued bleeding from incision. 4. Increased pain, redness, or drainage from the incision. 5. Increasing abdominal pain  The clinic staff is available to answer your questions during regular business hours.  Please dont hesitate to call and ask to speak to one of the nurses for clinical concerns.  If you have a medical emergency, go to the nearest emergency room or call 911.  A surgeon from [facility] is always on call at the hospital. [address], [place], [place], [place]  [address] ? [address] [place], [place], [place]   [address] [phone] ? [phone] ? FAX [phone]

## 2018-06-28 NOTE — Plan of Care (Signed)
  Problem: Elimination: Goal: Will not experience complications related to urinary retention Outcome: Adequate for Discharge   

## 2018-06-28 NOTE — Progress Notes (Signed)
Physical Therapy Treatment Patient Details Name: Wayne [name] MRN: [id number] DOB: 1930/04/06 Today's Date: 06/28/2018    History of Present Illness Wayne [name] is a 85 y.o. male with a hx of CAD, chronic atrial fibrillation on coumadin, severe MR medically managed, severe AS s/p TAVR 08/2017, and pulmonary HTN.  Admit for Sepsis, presumed to be due to appendicitis.     PT Comments    Pt received in bed, sleepy but agreeable to participation in therapy. He required min assist bed mobility, min assist transfers and min guard assist ambulation 100 feet with RW. Pt positioned in recliner with feet elevated at end of session.    Follow Up Recommendations  Home health PT;Supervision/Assistance - 24 hour     Equipment Recommendations  None recommended by PT    Recommendations for Other Services       Precautions / Restrictions Precautions Precautions: Fall;Other (comment) Precaution Comments: watch SpO2  Restrictions Weight Bearing Restrictions: No    Mobility  Bed Mobility Overal bed mobility: Needs Assistance Bed Mobility: Supine to Sit     Supine to sit: Min assist     General bed mobility comments: +rail, assist to elevate trunk  Transfers Overall transfer level: Needs assistance Equipment used: Rolling walker (2 wheeled) Transfers: Sit to/from [facility] Sit to Stand: Min assist Stand pivot transfers: Min assist       General transfer comment: assist to power up and stabilize initial standing balance  Ambulation/Gait Ambulation/Gait assistance: Min guard Gait Distance (Feet): 100 Feet Assistive device: Rolling walker (2 wheeled) Gait Pattern/deviations: Step-through pattern;Decreased stride length;Trunk flexed Gait velocity: decreased   General Gait Details: SpO2 >90% on RA, no LOB, min guard assist for safety   Stairs             Wheelchair Mobility    Modified Rankin (Stroke Patients Only)       Balance  Overall balance assessment: Needs assistance Sitting-balance support: Feet supported;No upper extremity supported Sitting balance-Leahy Scale: Fair     Standing balance support: Bilateral upper extremity supported;During functional activity Standing balance-Leahy Scale: Poor Standing balance comment: reliant on RW for gait                            Cognition Arousal/Alertness: Awake/alert Behavior During Therapy: WFL for tasks assessed/performed Overall Cognitive Status: Impaired/Different from baseline                   Orientation Level: Disoriented to;Situation Current Attention Level: Selective Memory: Decreased short-term memory Following Commands: Follows multi-step commands consistently Safety/Judgement: Decreased awareness of safety Awareness: Emergent Problem Solving: Requires verbal cues;Difficulty sequencing        Exercises      General Comments        Pertinent Vitals/Pain Pain Assessment: Faces Faces Pain Scale: Hurts a little bit Pain Location: abdomen Pain Descriptors / Indicators: Grimacing Pain Intervention(s): Monitored during session;Repositioned    Home Living                      Prior Function            PT Goals (current goals can now be found in the care plan section) Acute Rehab PT Goals Patient Stated Goal: to go home PT Goal Formulation: With patient/family Time For Goal Achievement: 07/05/18 Potential to Achieve Goals: Fair Progress towards PT goals: Progressing toward goals    Frequency    Min  3X/week      PT Plan Current plan remains appropriate    Co-evaluation              AM-PAC PT "6 Clicks" Mobility   Outcome Measure  Help needed turning from your back to your side while in a flat bed without using bedrails?: A Little Help needed moving from lying on your back to sitting on the side of a flat bed without using bedrails?: A Little Help needed moving to and from a bed to a chair  (including a wheelchair)?: A Little Help needed standing up from a chair using your arms (e.g., wheelchair or bedside chair)?: A Little Help needed to walk in hospital room?: A Little Help needed climbing 3-5 steps with a railing? : A Lot 6 Click Score: 17    End of Session Equipment Utilized During Treatment: Gait belt Activity Tolerance: Patient tolerated treatment well Patient left: in chair;with call bell/phone within reach;with chair alarm set Nurse Communication: Mobility status PT Visit Diagnosis: Muscle weakness (generalized) (M62.81);Pain;Unsteadiness on feet (R26.81);Difficulty in walking, not elsewhere classified (R26.2);Other abnormalities of gait and mobility (R26.89)     Time: 0093-8182 PT Time Calculation (min) (ACUTE ONLY): 24 min  Charges:  $Gait Training: 23-37 mins                     Lorrin Goodell, [place]  Office # [phone] Pager [phone]    [name] 06/28/2018, 12:53 PM

## 2018-06-28 NOTE — Progress Notes (Signed)
[facility] Progress Note  6 Days Post-Op  Subjective: CC-  Patient sitting up on side of bed eating breakfast. Overall feeling better. Denies n/v. BM yesterday.  Planning to d/c home today with his daughter and [facility] PT.  Objective: Vital signs in last 24 hours: Temp:  [97.8 F (36.6 C)-98.4 F (36.9 C)] 98.2 F (36.8 C) (01/10 0724) Pulse Rate:  [80-90] 87 (01/10 0724) Resp:  [16-18] 18 (01/09 2303) BP: (119-125)/(70-85) 125/70 (01/10 0724) SpO2:  [89 %-95 %] 91 % (01/10 0724) Weight:  [77.9 kg] 77.9 kg (01/10 0300) Last BM Date: 06/28/18  Intake/Output from previous day: 01/09 0701 - 01/10 0700 In: 1082.2 [P.O.:630; I.V.:207.8; IV Piggyback:244.4] Out: 601 [Urine:600; Stool:1] Intake/Output this shift: No intake/output data recorded.  PE: Gen: Alert, NAD, pleasant HEENT: EOM's intact, pupils equal and round Pulm:effort normal Abd: Soft,nondistended, nontender,+BS, multiple lap incisions cdi   Lab Results:  Recent Labs    06/27/18 0244 06/28/18 0309  WBC 8.8 7.8  HGB 11.8* 11.7*  HCT 36.6* 36.2*  PLT 169 184   BMET Recent Labs    06/27/18 0244 06/28/18 0309  NA 137 135  K 3.8 3.9  CL 98 97*  CO2 31 28  GLUCOSE 100* 98  BUN 13 11  CREATININE 0.69 0.81  CALCIUM 8.4* 8.5*   PT/INR Recent Labs    06/27/18 0244 06/28/18 0309  LABPROT 23.3* 24.6*  INR 2.10 2.25   CMP     Component Value Date/Time   NA 135 06/28/2018 0309   NA 142 10/12/2017 1214   K 3.9 06/28/2018 0309   CL 97 (L) 06/28/2018 0309   CO2 28 06/28/2018 0309   GLUCOSE 98 06/28/2018 0309   BUN 11 06/28/2018 0309   BUN 18 10/12/2017 1214   CREATININE 0.81 06/28/2018 0309   CREATININE 1.02 01/27/2016 0938   CALCIUM 8.5 (L) 06/28/2018 0309   PROT 5.9 (L) 06/23/2018 0233   ALBUMIN 2.8 (L) 06/23/2018 0233   AST 30 06/23/2018 0233   ALT 16 06/23/2018 0233   ALKPHOS 33 (L) 06/23/2018 0233   BILITOT 0.3 06/23/2018 0233   GFRNONAA >60 06/28/2018 0309   GFRNONAA  67 01/27/2016 0938   GFRAA >60 06/28/2018 0309   GFRAA 78 01/27/2016 0938   Lipase     Component Value Date/Time   LIPASE 30 06/20/2018 0856       Studies/Results: No results found.  Anti-infectives: Anti-infectives (From admission, onward)   Start     Dose/Rate Route Frequency Ordered Stop   06/22/18 1000  vancomycin (VANCOCIN) 1,500 mg in sodium chloride 0.9 % 500 mL IVPB  Status:  Discontinued     1,500 mg 250 mL/hr over 120 Minutes Intravenous Every 24 hours 06/21/18 2201 06/24/18 1050   06/21/18 1100  vancomycin (VANCOCIN) 1,500 mg in sodium chloride 0.9 % 500 mL IVPB  Status:  Discontinued     1,500 mg 250 mL/hr over 120 Minutes Intravenous Every 24 hours 06/20/18 1013 06/21/18 0818   06/21/18 1100  vancomycin (VANCOCIN) 1,250 mg in sodium chloride 0.9 % 250 mL IVPB  Status:  Discontinued     1,250 mg 166.7 mL/hr over 90 Minutes Intravenous Every 24 hours 06/21/18 0818 06/21/18 2201   06/20/18 2200  ceFEPIme (MAXIPIME) 2 g in sodium chloride 0.9 % 100 mL IVPB     2 g 200 mL/hr over 30 Minutes Intravenous Every 12 hours 06/20/18 1014 06/29/18 2359   06/20/18 0915  aztreonam (AZACTAM) 2 g in sodium chloride  0.9 % 100 mL IVPB  Status:  Discontinued     2 g 200 mL/hr over 30 Minutes Intravenous  Once 06/20/18 0905 06/20/18 0909   06/20/18 0915  metroNIDAZOLE (FLAGYL) IVPB 500 mg  Status:  Discontinued     500 mg 100 mL/hr over 60 Minutes Intravenous Every 8 hours 06/20/18 0905 06/26/18 1335   06/20/18 0915  vancomycin (VANCOCIN) IVPB 1000 mg/200 mL premix  Status:  Discontinued     1,000 mg 200 mL/hr over 60 Minutes Intravenous  Once 06/20/18 0905 06/20/18 0909   06/20/18 0915  ceFEPIme (MAXIPIME) 2 g in sodium chloride 0.9 % 100 mL IVPB     2 g 200 mL/hr over 30 Minutes Intravenous  Once 06/20/18 0909 06/20/18 1024   06/20/18 0915  vancomycin (VANCOCIN) 1,500 mg in sodium chloride 0.9 % 500 mL IVPB     1,500 mg 250 mL/hr over 120 Minutes Intravenous  Once 06/20/18 0909  06/20/18 1316       Assessment/Plan S/p TAVR CAD A fib on coumadin- INR 2.25 today, therapeutic HTN H/o MI H/O CVA Thrombocytopenia- 184K from 169Ktoday, improving DNR  RecurrentAcute appendicitis Laparoscopic appendectomy 06/22/18, Dr. Georganna Skeans - POD 6 - perforated appendicitis - path: LOW-GRADE APPENDICEAL MUCINOUS NEOPLASM - needs 7 days antibiotics postop  FEN -IV fluids/Heart healthy/Boost VTE -heparin/coumadin ID: Vancomycin 06/20/17>>1/6, Cefepime 1/2 >> day#9, flagyl 1/2 >> 1/8- Needs 7 days abx post op thru 06/29/18 Foley: out  Plan:Patient stable for discharge from surgical standpoint. Surgical pathology reviewed with patient and his daughter, we have arranged outpatient follow up with our colorectal surgeon Dr. Marcello Moores. Discharged instructions and f/u info on AVS. He needs to go home on oral abx through tomorrow.   LOS: 7 days    Wellington Hampshire , [facility] 06/28/2018, 9:04 AM Pager: [phone] Mon 7:00 am -11:30 AM Tues-Fri 7:00 am-4:30 pm Sat-Sun 7:00 am-11:30 am

## 2018-06-28 NOTE — Progress Notes (Signed)
ANTICOAGULATION CONSULT NOTE - Follow-Up  Pharmacy Consult for Warfarin Indication: atrial fibrillation  Allergies  Allergen Reactions  . Penicillins Rash and Other (See Comments)    PATIENT HAS HAD A PCN REACTION WITH IMMEDIATE RASH, FACIAL/TONGUE/THROAT SWELLING, SOB, OR LIGHTHEADEDNESS WITH HYPOTENSION:  #  #  #  YES  #  #  #   Has patient had a PCN reaction causing severe rash involving mucus membranes or skin necrosis: No Has patient had a PCN reaction that required hospitalization No Has patient had a PCN reaction occurring within the last 10 years: No If all of the above answers are "NO", then may proceed with Cephalosporin use.  . Prednisone Other (See Comments)    Wheezing  . Procaine Hcl Other (See Comments)    Passed out after being given this at dental appointment  . Levofloxacin Rash  . Oxycodone Other (See Comments)    constipation    Patient Measurements: Height: 5\' 10"  (177.8 cm) Weight: 171 lb 11.8 oz (77.9 kg) IBW/kg (Calculated) : 73 Heparin Dosing Weight: 83 kg  Vital Signs: Temp: 98.2 F (36.8 C) (01/10 0724) Temp Source: Oral (01/10 0724) BP: 125/70 (01/10 0724) Pulse Rate: 87 (01/10 0724)  Labs: Recent Labs    06/26/18 0219 06/27/18 0244 06/28/18 0309  HGB 11.6* 11.8* 11.7*  HCT 35.0* 36.6* 36.2*  PLT 149* 169 184  LABPROT 20.9* 23.3* 24.6*  INR 1.83 2.10 2.25  HEPARINUNFRC 0.32 0.33  --   CREATININE 0.67 0.69 0.81    Estimated Creatinine Clearance: 67.6 mL/min (by C-G formula based on SCr of 0.81 mg/dL).   Assessment: 85 YO M on warfarin PTA for afib. Patient also has history of TAVR. Pt is s/p laparoscopic appendectomy 1/4 for which he was bridged with herparin around the time of procedure.  INR now therapeutic and pt continues on warfarin alone.    PTA warfarin: 5 mg daily exc 2.5 mg Tues/Fri  Goal of Therapy:  INR 2-3 Monitor platelets by anticoagulation protocol: Yes    Plan:  Continue home dose warfarin 2.5 mg PO  tonight Daily INR Monitor for s/sx of bleeding   [employer], Pharm.D., BCPS Clinical Pharmacist Clinical phone for 06/28/2018 from 8:30-4:00 is [phone].  **Pharmacist phone directory can now be found on [email or web address] ([email or web address]).  Listed under [place].  06/28/2018 8:08 AM

## 2018-06-30 DIAGNOSIS — I340 Nonrheumatic mitral (valve) insufficiency: Secondary | ICD-10-CM | POA: Diagnosis not present

## 2018-06-30 DIAGNOSIS — M479 Spondylosis, unspecified: Secondary | ICD-10-CM | POA: Diagnosis not present

## 2018-06-30 DIAGNOSIS — M160 Bilateral primary osteoarthritis of hip: Secondary | ICD-10-CM | POA: Diagnosis not present

## 2018-06-30 DIAGNOSIS — I5032 Chronic diastolic (congestive) heart failure: Secondary | ICD-10-CM | POA: Diagnosis not present

## 2018-06-30 DIAGNOSIS — R2689 Other abnormalities of gait and mobility: Secondary | ICD-10-CM | POA: Diagnosis not present

## 2018-06-30 DIAGNOSIS — I2510 Atherosclerotic heart disease of native coronary artery without angina pectoris: Secondary | ICD-10-CM | POA: Diagnosis not present

## 2018-06-30 DIAGNOSIS — N138 Other obstructive and reflux uropathy: Secondary | ICD-10-CM | POA: Diagnosis not present

## 2018-06-30 DIAGNOSIS — Z483 Aftercare following surgery for neoplasm: Secondary | ICD-10-CM | POA: Diagnosis not present

## 2018-06-30 DIAGNOSIS — I69398 Other sequelae of cerebral infarction: Secondary | ICD-10-CM | POA: Diagnosis not present

## 2018-06-30 DIAGNOSIS — D649 Anemia, unspecified: Secondary | ICD-10-CM | POA: Diagnosis not present

## 2018-06-30 DIAGNOSIS — M48061 Spinal stenosis, lumbar region without neurogenic claudication: Secondary | ICD-10-CM | POA: Diagnosis not present

## 2018-06-30 DIAGNOSIS — M5416 Radiculopathy, lumbar region: Secondary | ICD-10-CM | POA: Diagnosis not present

## 2018-06-30 DIAGNOSIS — I482 Chronic atrial fibrillation, unspecified: Secondary | ICD-10-CM | POA: Diagnosis not present

## 2018-06-30 DIAGNOSIS — N401 Enlarged prostate with lower urinary tract symptoms: Secondary | ICD-10-CM | POA: Diagnosis not present

## 2018-06-30 DIAGNOSIS — I110 Hypertensive heart disease with heart failure: Secondary | ICD-10-CM | POA: Diagnosis not present

## 2018-06-30 DIAGNOSIS — C181 Malignant neoplasm of appendix: Secondary | ICD-10-CM | POA: Diagnosis not present

## 2018-07-01 ENCOUNTER — Telehealth: Payer: Self-pay | Admitting: Family Medicine

## 2018-07-01 ENCOUNTER — Ambulatory Visit (INDEPENDENT_AMBULATORY_CARE_PROVIDER_SITE_OTHER): Payer: Medicare Other | Admitting: Pharmacist

## 2018-07-01 DIAGNOSIS — Z5181 Encounter for therapeutic drug level monitoring: Secondary | ICD-10-CM

## 2018-07-01 DIAGNOSIS — I482 Chronic atrial fibrillation, unspecified: Secondary | ICD-10-CM | POA: Diagnosis not present

## 2018-07-01 LAB — POCT INR: INR: 2 (ref 2.0–3.0)

## 2018-07-01 NOTE — Telephone Encounter (Signed)
Ryan with [facility] is requesting a call back for verbal orders for patient, they are looking for PT twice a week for four weeks. Please advise and call Ryan back at [phone]

## 2018-07-01 NOTE — Telephone Encounter (Signed)
Called and spoke to [facility] to give verbal orders for PT. MPulliam, CMA/RT(R)

## 2018-07-17 ENCOUNTER — Encounter: Payer: Self-pay | Admitting: Family Medicine

## 2018-07-17 ENCOUNTER — Ambulatory Visit (INDEPENDENT_AMBULATORY_CARE_PROVIDER_SITE_OTHER): Payer: Medicare Other | Admitting: Family Medicine

## 2018-07-17 VITALS — BP 125/76 | HR 86 | Temp 98.2°F | Ht 71.0 in | Wt 162.6 lb

## 2018-07-17 DIAGNOSIS — D649 Anemia, unspecified: Secondary | ICD-10-CM

## 2018-07-17 DIAGNOSIS — R6510 Systemic inflammatory response syndrome (SIRS) of non-infectious origin without acute organ dysfunction: Secondary | ICD-10-CM

## 2018-07-17 DIAGNOSIS — I482 Chronic atrial fibrillation, unspecified: Secondary | ICD-10-CM

## 2018-07-17 DIAGNOSIS — N138 Other obstructive and reflux uropathy: Secondary | ICD-10-CM

## 2018-07-17 DIAGNOSIS — Z09 Encounter for follow-up examination after completed treatment for conditions other than malignant neoplasm: Secondary | ICD-10-CM

## 2018-07-17 DIAGNOSIS — G479 Sleep disorder, unspecified: Secondary | ICD-10-CM

## 2018-07-17 DIAGNOSIS — G4700 Insomnia, unspecified: Secondary | ICD-10-CM

## 2018-07-17 DIAGNOSIS — N401 Enlarged prostate with lower urinary tract symptoms: Secondary | ICD-10-CM

## 2018-07-17 DIAGNOSIS — K37 Unspecified appendicitis: Secondary | ICD-10-CM

## 2018-07-17 DIAGNOSIS — R400 Somnolence: Secondary | ICD-10-CM

## 2018-07-17 NOTE — Patient Instructions (Addendum)
You can consume Boost or Ensure shakes between meals to aid with stabilizing weight.

## 2018-07-17 NOTE — Progress Notes (Signed)
Impression and Recommendations:    1. Hospital discharge follow-up   2. SIRS (systemic inflammatory response syndrome) (HCC)   3. Sleep disturbance   4. h/o Appendicitis, unspecified appendicitis type   5. Chronic atrial fibrillation   6. Daytime somnolence   7. Insomnia, unspecified type   8. BPH with obstruction/lower urinary tract symptoms   9. Chronic anemia      Hospital discharge follow-up - Plan: CBC with Differential/Platelet, Comprehensive metabolic panel  SIRS (systemic inflammatory response syndrome) (HCC) - Plan: CBC with Differential/Platelet, Comprehensive metabolic panel  Sleep disturbance  h/o Appendicitis, unspecified appendicitis type - Plan: CBC with Differential/Platelet, Comprehensive metabolic panel  Chronic atrial fibrillation - Plan: CBC with Differential/Platelet, Comprehensive metabolic panel  Daytime somnolence  Insomnia, unspecified type - Plan: Comprehensive metabolic panel  BPH with obstruction/lower urinary tract symptoms  Chronic anemia - Plan: CBC with Differential/Platelet  Hospital Follow up:  -Advised the patient to try and stay awake during the day as best as possible so that way, there isn't an increase in insomnia. Recommended that the patient complete any types of mind-occupying activity to keep him up.  -Advised that the patient continue using the spirometer and that during the commercials, takes 3 deep breaths and hold them as long as he can.   A-fib with RVR:  -Advised that the patient maintain follow up with his Cardiologist, Dr. Stanford Breed on 07/21/2018.   Urinary Issues:  -Advised that if the patient is still having issues with urinary hesitancy and nocturia, then the patient should follow up with his Urologist within 1 month.   Weight loss:  Per patient daughter, patient has lost a significant amount of weight since the summer.  -Per records on the 12/04/2017 visit, the patient weighed 173 lb and today, he is 162 lb.    -Advise that the patient start on meal replacement shakes (Boost or Ensure) or Protein shakes to consume between meals.    Follow up PRN. Labs today.      Orders Placed This Encounter  Procedures  . CBC with Differential/Platelet  . Comprehensive metabolic panel    Gross side effects, risk and benefits, and alternatives of medications and treatment plan in general discussed with patient.  Patient is aware that all medications have potential side effects and we are unable to predict every side effect or drug-drug interaction that may occur.   Patient will call with any questions prior to using medication if they have concerns.    Expresses verbal understanding and consents to current therapy and treatment regimen.  No barriers to understanding were identified.  Red flag symptoms and signs discussed in detail.  Patient expressed understanding regarding what to do in case of emergency\urgent symptoms  Please see AVS handed out to patient at the end of our visit for further patient instructions/ counseling done pertaining to today's office visit.   Return for f/up 44mo or sooner if issues.     Note:  This note was prepared with assistance of Dragon voice recognition software. Occasional wrong-word or sound-a-like substitutions may have occurred due to the inherent limitations of voice recognition software.  This document serves as a record of services personally performed by [name], DO. It was created on her behalf by Steva Colder, a trained medical scribe. The creation of this record is based on the scribe's personal observations and the provider's statements to them.   I have reviewed the above medical documentation for accuracy and completeness and I concur.  [name], DO 07/18/2018 8:22  AM   --------------------------------------------------------------------------------------------------------------------------------------------------------------------------------------------------------------------------------------------    Subjective:     HPI: Divit Stipp is a 85 y.o. male who presents to [place] at [facility] today for issues as discussed below.  Hospital Follow up:  He was seen in the hospital for acute appendicitis and sepsis on 06/20/2018. He had an appendectomy on 06/22/2018 completed by Dr. Georganna Skeans. It was also noted during this admission that the patient had A-fib with RVR. The patient and his wife have been living at their daughters home for the past 3 weeks. Daughter reports that for the first 6 days following being discharged the patient stayed in bed. The patient has a follow up with Cardiologist, Dr. Stanford Breed on 07/21/2018. He has been eating well. He reports abdominal pain, diarrhea (while on abx for 1 week only, resolved after completion of abx), insomnia, nocturia, and chronic hesitancy (treated with Uroxatral due to BPH). He hasn't had a follow up with his Urologist in a while. He is using a walker at home and he is using a PT to aid with rehabilitation and the PT comes twice a week. His daughter quit her job 6 months ago to help with her parents. He denies fever, chills, SOB, CP, dizziness, and any other symptoms.       Wt Readings from Last 3 Encounters:  07/17/18 162 lb 9.6 oz (73.8 kg)  06/28/18 171 lb 11.8 oz (77.9 kg)  05/01/18 168 lb 3.2 oz (76.3 kg)   BP Readings from Last 3 Encounters:  07/17/18 125/76  06/28/18 125/70  05/01/18 118/80   Pulse Readings from Last 3 Encounters:  07/17/18 86  06/28/18 87  05/01/18 87   BMI Readings from Last 3 Encounters:  07/17/18 22.68 kg/m  06/28/18 24.64 kg/m  05/01/18 23.46 kg/m     Patient Care Team    Relationship Specialty Notifications Start End  [name], DO PCP - General Family Medicine  10/23/16   Lelon Perla, MD PCP - Cardiology Cardiology Admissions 07/06/17   Jarome Matin, MD Consulting Physician Dermatology  10/23/16   Kathie Rhodes, MD Consulting Physician Urology  10/23/16   Latanya Maudlin, MD Consulting Physician Orthopedic Surgery  11/16/16    Comment: back pain     Patient Active Problem List   Diagnosis Date Noted  . Vitamin D deficiency 11/16/2016    Priority: High  . H/O non anemic vitamin B12 deficiency 11/16/2016    Priority: High  . Chronic Colonic dysmotility 11/16/2016    Priority: High  . Chronic atrial fibrillation 09/13/2010    Priority: High  . Chronic anticoagulation     Priority: Medium  . HTN (hypertension)     Priority: Medium  . CAD (coronary artery disease)     Priority: Medium  . h/o CVA (cerebrovascular accident due to intracerebral hemorrhage) 09/13/2010    Priority: Medium  . BPH with obstruction/lower urinary tract symptoms 09/13/2016    Priority: Low  . Daytime somnolence 07/17/2018  . Appendicitis 07/17/2018  . Chronic anemia 07/17/2018  . Oxygen desaturation   . Abnormality of gait   . Impaired mobility and ADLs   . Acute on chronic diastolic heart failure ([place])   . Sepsis ([place]) 06/20/2018  . SIRS (systemic inflammatory response syndrome) ([place]) 06/20/2018  . Dyspnea   . Normocytic anemia   . Appendicitis with perforation 04/21/2018  . Pain in left knee 02/26/2018  . Pain in right knee 02/26/2018  . Fall  01/21/2018  . Closed fracture of left inferior pubic ramus ([place]) 01/21/2018  . Constipation 12/19/2017  .  hernia of linea alba 12/19/2017  . Osteoarthritis of hip 09/24/2017  . Gastroenteritis 09/10/2017  . S/P TAVR (transcatheter aortic valve replacement) 09/04/2017  . Severe mitral regurgitation   . Insomnia 07/10/2017  . High risk medication use 07/10/2017  . Long term current use of anticoagulant 07/10/2017  . Chronic diastolic CHF (congestive heart failure) ([place])  05/20/2017  . Hyponatremia 05/20/2017  . Severe aortic stenosis 05/20/2017  . Urinary retention   . 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 Spinal stenosis at L4-L5 level 04/26/2017  . Lumbar radiculopathy, chronic 04/26/2017  . Benign colonic polyp- 25 yrs ago.  11/16/2016  . Hypokalemia 09/13/2016  . Urinary frequency 07/10/2016  . Left bundle branch block (LBBB) on electrocardiogram 06/30/2016  . History of stroke 06/30/2016  . Generalized weakness 06/30/2016  . Asthenia 06/30/2016  . Osteoarthritis 04/04/2016  . Encounter for therapeutic drug monitoring 08/06/2013    Past Medical history, Surgical history, Family history, Social history, Allergies and Medications have been entered into the medical record, reviewed and changed as needed.    Current Meds  Medication Sig  . acetaminophen (TYLENOL) 500 MG tablet Take 500 mg by mouth every 6 (six) hours as needed for moderate pain or headache.   . alfuzosin (UROXATRAL) 10 MG 24 hr tablet Take 10 mg by mouth daily with breakfast.  . ARTIFICIAL TEAR OP Place 1 drop into both eyes daily as needed (dry eyes).  Marland Kitchen aspirin EC 81 MG tablet Take 81 mg by mouth daily.  . digoxin (LANOXIN) 0.125 MG tablet Take 1 tablet (125 mcg total) by mouth daily.  Marland Kitchen docusate sodium (COLACE) 100 MG capsule Take 100 mg by mouth every evening.  . finasteride (PROSCAR) 5 MG tablet Take 5 mg by mouth at bedtime.   . furosemide (LASIX) 40 MG tablet Take 1 tablet (40 mg total) by mouth daily.  . Melatonin 10 MG TABS Take 10 mg by mouth at bedtime.   . Menthol, Topical Analgesic, (BLUE-EMU MAXIMUM STRENGTH EX) Apply 1 application topically 3 (three) times daily as needed (muscle pain in back). alterrnates between [employer] and Blu Emu  . metoprolol tartrate (LOPRESSOR) 25 MG tablet Take 0.5 tablets (12.5 mg total) by mouth 2 (two) times daily.  . polyethylene glycol (MIRALAX / GLYCOLAX) packet Take 17 g by mouth daily as needed for  mild constipation.  . potassium chloride (K-DUR) 10 MEQ tablet Take 5 mEq by mouth daily as needed for fluid. TAKE POTASSIUM IF YOU TAKE LASIX  . potassium chloride (K-DUR,KLOR-CON) 10 MEQ tablet Take 1 tablet (10 mEq total) by mouth daily.  Marland Kitchen saccharomyces boulardii (FLORASTOR) 250 MG capsule Take 1 capsule (250 mg total) by mouth 2 (two) times daily.  . sodium chloride (OCEAN) 0.65 % SOLN nasal spray Place 1 spray into both nostrils daily as needed for congestion.  . triamcinolone cream (KENALOG) 0.1 % Apply 1 application topically daily as needed (dry skin).   Marland Kitchen warfarin (COUMADIN) 2.5 MG tablet TAKE 1 TO 2 TABLETS BY MOUTH ONCE DAILY AS  DIRECTED  BY  COUMADIN  CLINIC (Patient taking differently: Take 2.5-5 mg by mouth See admin instructions. Take 2.5mg  on Tuesdays and Fridays and all other days take 5mg )    Allergies:  Allergies  Allergen Reactions  . Penicillins Rash and Other (See Comments)    PATIENT HAS HAD A PCN REACTION  WITH IMMEDIATE RASH, FACIAL/TONGUE/THROAT SWELLING, SOB, OR LIGHTHEADEDNESS WITH HYPOTENSION:  #  #  #  YES  #  #  #   Has patient had a PCN reaction causing severe rash involving mucus membranes or skin necrosis: No Has patient had a PCN reaction that required hospitalization No Has patient had a PCN reaction occurring within the last 10 years: No If all of the above answers are "NO", then may proceed with Cephalosporin use.  . Prednisone Other (See Comments)    Wheezing  . Procaine Hcl Other (See Comments)    Passed out after being given this at dental appointment  . Levofloxacin Rash  . Oxycodone Other (See Comments)    constipation     Review of Systems:  A fourteen system review of systems was performed and found to be positive as per HPI.   Objective:   Blood pressure 125/76, pulse 86, temperature 98.2 F (36.8 C), height 5\' 11"  (1.803 m), weight 162 lb 9.6 oz (73.8 kg), SpO2 97 %. Body mass index is 22.68 kg/m. General:  Well Developed, well  nourished, appropriate for stated age.  Neuro:  Alert and oriented,  extra-ocular muscles intact  HEENT:  Normocephalic, atraumatic, neck supple, no carotid bruits appreciated  Skin:  no gross rash, warm, pink. Cardiac:  RRR, S1 S2 Respiratory:  ECTA B/L and A/P, Not using accessory muscles, speaking in full sentences- unlabored. Vascular:  Ext warm, no cyanosis apprec.; cap RF less 2 sec. Psych:  No HI/SI, judgement and insight good, Euthymic mood. Full Affect.

## 2018-07-18 LAB — COMPREHENSIVE METABOLIC PANEL
ALT: 9 IU/L (ref 0–44)
AST: 16 IU/L (ref 0–40)
Albumin/Globulin Ratio: 1.6 (ref 1.2–2.2)
Albumin: 4.4 g/dL (ref 3.6–4.6)
Alkaline Phosphatase: 69 IU/L (ref 39–117)
BILIRUBIN TOTAL: 0.5 mg/dL (ref 0.0–1.2)
BUN/Creatinine Ratio: 13 (ref 10–24)
BUN: 11 mg/dL (ref 8–27)
CO2: 28 mmol/L (ref 20–29)
Calcium: 9.2 mg/dL (ref 8.6–10.2)
Chloride: 96 mmol/L (ref 96–106)
Creatinine, Ser: 0.84 mg/dL (ref 0.76–1.27)
GFR calc Af Amer: 92 mL/min/{1.73_m2} (ref >59)
GFR calc non Af Amer: 79 mL/min/{1.73_m2} (ref >59)
GLUCOSE: 105 mg/dL — AB (ref 65–99)
Globulin, Total: 2.8 g/dL (ref 1.5–4.5)
Potassium: 3.3 mmol/L — ABNORMAL LOW (ref 3.5–5.2)
Sodium: 143 mmol/L (ref 134–144)
TOTAL PROTEIN: 7.2 g/dL (ref 6.0–8.5)

## 2018-07-18 LAB — CBC WITH DIFFERENTIAL/PLATELET
Basophils Absolute: 0.1 10*3/uL (ref 0.0–0.2)
Basos: 1 %
EOS (ABSOLUTE): 0.3 10*3/uL (ref 0.0–0.4)
Eos: 5 %
Hematocrit: 42.8 % (ref 37.5–51.0)
Hemoglobin: 13.8 g/dL (ref 13.0–17.7)
Immature Grans (Abs): 0 10*3/uL (ref 0.0–0.1)
Immature Granulocytes: 1 %
LYMPHS: 15 %
Lymphocytes Absolute: 1.1 10*3/uL (ref 0.7–3.1)
MCH: 28.6 pg (ref 26.6–33.0)
MCHC: 32.2 g/dL (ref 31.5–35.7)
MCV: 89 fL (ref 79–97)
Monocytes Absolute: 1 10*3/uL — ABNORMAL HIGH (ref 0.1–0.9)
Monocytes: 15 %
Neutrophils Absolute: 4.5 10*3/uL (ref 1.4–7.0)
Neutrophils: 63 %
Platelets: 184 10*3/uL (ref 150–450)
RBC: 4.83 x10E6/uL (ref 4.14–5.80)
RDW: 14.7 % (ref 11.6–15.4)
WBC: 7.1 10*3/uL (ref 3.4–10.8)

## 2018-07-29 ENCOUNTER — Ambulatory Visit (INDEPENDENT_AMBULATORY_CARE_PROVIDER_SITE_OTHER): Payer: Medicare Other | Admitting: *Deleted

## 2018-07-29 DIAGNOSIS — I482 Chronic atrial fibrillation, unspecified: Secondary | ICD-10-CM

## 2018-07-29 DIAGNOSIS — Z5181 Encounter for therapeutic drug level monitoring: Secondary | ICD-10-CM

## 2018-07-29 LAB — POCT INR: INR: 4.8 — AB (ref 2.0–3.0)

## 2018-07-29 NOTE — Patient Instructions (Signed)
Description   Skip today and tomorrow's dose, then continue 1 tablet daily except 1/2 tablet each Tuesdays and Fridays.  Repeat INR in 10 days.

## 2018-08-01 ENCOUNTER — Telehealth: Payer: Self-pay

## 2018-08-06 ENCOUNTER — Ambulatory Visit (INDEPENDENT_AMBULATORY_CARE_PROVIDER_SITE_OTHER): Payer: Medicare Other | Admitting: Pharmacist

## 2018-08-06 DIAGNOSIS — Z5181 Encounter for therapeutic drug level monitoring: Secondary | ICD-10-CM | POA: Diagnosis not present

## 2018-08-06 DIAGNOSIS — I482 Chronic atrial fibrillation, unspecified: Secondary | ICD-10-CM

## 2018-08-06 LAB — POCT INR: INR: 2.8 (ref 2.0–3.0)

## 2018-08-09 NOTE — Telephone Encounter (Signed)
TAVR completed 09/04/2017. Scheduled patient for 1 year OV with Nell Range and subsequent echocardiogram on 09/05/2018. DPR was grateful for call and agrees with treatment plan.

## 2018-08-21 NOTE — Progress Notes (Signed)
HPI: FUAS/MR,s/p TAVR, atrial fibrillation and CAD. Patient has had previous PCI of the obtuse marginal.  Last cardiac catheterization February 2019 showed mild nonobstructive coronary disease, moderate pulmonary hypertension and large V waves consistent with severe mitral regurgitation. 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  Carotid Dopplers February 2019 showed 1 to 39% bilateral stenosis.  Underwent TAVR 3/19.  He is felt not to be candidate for MitraClip for mitral regurgitation.  Last echocardiogram May 2019 showed normal LV systolic function, status post aortic valve replacement with trace aortic insufficiency, prolapse of mitral valve with severe mitral regurgitation, severe biatrial enlargement and mild tricuspid regurgitation. Urology consultation suggested. Had appendicitis and underwent appendectomy January 2020.  Since last seen,there is no dyspnea, chest pain, palpitations or syncope.  He does have some generalized weakness.  Current Outpatient Medications  Medication Sig Dispense Refill  . acetaminophen (TYLENOL) 500 MG tablet Take 500 mg by mouth every 6 (six) hours as needed for moderate pain or headache.     . alfuzosin (UROXATRAL) 10 MG 24 hr tablet Take 10 mg by mouth daily with breakfast.    . ARTIFICIAL TEAR OP Place 1 drop into both eyes daily as needed (dry eyes).    Marland Kitchen aspirin EC 81 MG tablet Take 81 mg by mouth daily.    . digoxin (LANOXIN) 0.125 MG tablet Take 1 tablet (125 mcg total) by mouth daily. 90 tablet 3  . docusate sodium (COLACE) 100 MG capsule Take 100 mg by mouth every evening.    . finasteride (PROSCAR) 5 MG tablet Take 5 mg by mouth at bedtime.     . furosemide  (LASIX) 40 MG tablet Take 1 tablet (40 mg total) by mouth daily. 30 tablet 0  . Melatonin 10 MG TABS Take 10 mg by mouth at bedtime.     . Menthol, Topical Analgesic, (BLUE-EMU MAXIMUM STRENGTH EX) Apply 1 application topically 3 (three) times daily as needed (muscle pain in back). alterrnates between [employer] and Blu Emu    . metoprolol tartrate (LOPRESSOR) 25 MG tablet Take 0.5 tablets (12.5 mg total) by mouth 2 (two) times daily. 30 tablet 0  . polyethylene glycol (MIRALAX / GLYCOLAX) packet Take 17 g by mouth daily as needed for mild constipation.    . potassium chloride (K-DUR) 10 MEQ tablet Take 5 mEq by mouth daily as needed for fluid. TAKE POTASSIUM IF YOU TAKE LASIX  3  . potassium chloride (K-DUR,KLOR-CON) 10 MEQ tablet Take 1 tablet (10 mEq total) by mouth daily. 30 tablet 3  . saccharomyces boulardii (FLORASTOR) 250 MG capsule Take 1 capsule (250 mg total) by mouth 2 (two) times daily. 60 capsule 0  . sodium chloride (OCEAN) 0.65 % SOLN nasal spray Place 1 spray into both nostrils daily as needed for congestion.    . triamcinolone cream (KENALOG) 0.1 % Apply 1 application topically daily as needed (dry skin).     Marland Kitchen warfarin (COUMADIN) 2.5 MG tablet TAKE 1 TO 2 TABLETS BY MOUTH ONCE DAILY AS  DIRECTED  BY  COUMADIN  CLINIC (Patient taking differently: Take 2.5-5 mg by mouth See admin instructions. Take 2.5mg  on Tuesdays and Fridays and all other days take 5mg ) 180 tablet 1   No current facility-administered medications for this visit.  Past Medical History:  Diagnosis Date  . Anxiety    "maybe; having trouble at night" (04/24/2018)  . Aortic stenosis   . Arthritis   . Atrial fibrillation, chronic   . BPH (benign prostatic hyperplasia)   . CAD (coronary artery disease)   . Chronic anticoagulation   . Heart murmur   . Hepatitis 1960s   "drank contaminated water" (04/23/2018)  . History of chicken pox   . HTN (hypertension)    "have never had high blood pressure"  (04/23/2018)  . Mitral stenosis   . Old MI (myocardial infarction) 2007   s/p PCI to distal OM (no stent)  . Pneumonia    "1 time" (04/23/2018)  . Severe mitral regurgitation   . Stroke, embolic ([place])    0272-5 weeks after his heart attack; "weak all over since" (04/23/2018)    Past Surgical History:  Procedure Laterality Date  . CATARACT EXTRACTION W/ INTRAOCULAR LENS IMPLANT Left   . CORONARY ANGIOPLASTY  2007   Distal OM  . LAPAROSCOPIC APPENDECTOMY N/A 06/22/2018   Procedure: APPENDECTOMY LAPAROSCOPIC;  Surgeon: Georganna Skeans, MD;  Location: [facility];  Service: General;  Laterality: N/A;  . PROSTATE BIOPSY    . RIGHT/LEFT HEART CATH AND CORONARY ANGIOGRAPHY N/A 08/01/2017   Procedure: RIGHT/LEFT HEART CATH AND CORONARY ANGIOGRAPHY;  Surgeon: Sherren Mocha, MD;  Location: [facility];  Service: Cardiovascular;  Laterality: N/A;  . TEE WITHOUT CARDIOVERSION N/A 07/20/2017   Procedure: TRANSESOPHAGEAL ECHOCARDIOGRAM (TEE);  Surgeon: Lelon Perla, MD;  Location: [facility];  Service: Cardiovascular;  Laterality: N/A;  . TEE WITHOUT CARDIOVERSION N/A 09/04/2017   Procedure: TRANSESOPHAGEAL ECHOCARDIOGRAM (TEE);  Surgeon: Sherren Mocha, MD;  Location: [facility];  Service: Open Heart Surgery;  Laterality: N/A;  . TRANSCATHETER AORTIC VALVE REPLACEMENT, TRANSFEMORAL N/A 09/04/2017   Procedure: TRANSCATHETER AORTIC VALVE REPLACEMENT, TRANSFEMORAL;  Surgeon: Sherren Mocha, MD;  Location: [facility];  Service: Open Heart Surgery;  Laterality: N/A;    Social History   Socioeconomic History  . Marital status: Married    Spouse name: Not on file  . Number of children: 3  . Years of education: 82  . Highest education level: Not on file  Occupational History  . Not on file  Social Needs  . Financial resource strain: Not on file  . Food insecurity:    Worry: Not on file    Inability: Not on file  . Transportation needs:    Medical: Not on file    Non-medical: Not on file  Tobacco  Use  . Smoking status: Never Smoker  . Smokeless tobacco: Never Used  Substance and Sexual Activity  . Alcohol use: Never    Frequency: Never  . Drug use: Never  . Sexual activity: Not on file  Lifestyle  . Physical activity:    Days per week: Not on file    Minutes per session: Not on file  . Stress: Not on file  Relationships  . Social connections:    Talks on phone: Not on file    Gets together: Not on file    Attends religious service: Not on file    Active member of club or organization: Not on file    Attends meetings of clubs or organizations: Not on file    Relationship status: Not on file  . Intimate partner violence:    Fear of current or ex partner: Not on file    Emotionally abused: Not on file    Physically abused: Not  on file    Forced sexual activity: Not on file  Other Topics Concern  . Not on file  Social History Narrative   Fun: Garden, work in the yard, anything physical and walk daily    Family History  Problem Relation Age of Onset  . Heart disease Mother   . Heart attack Mother   . Heart disease Father   . Heart attack Father   . Heart attack Brother   . Heart attack Sister   . Stroke Brother     ROS: weakness but no fevers or chills, productive cough, hemoptysis, dysphasia, odynophagia, melena, hematochezia, dysuria, hematuria, rash, seizure activity, orthopnea, PND, pedal edema, claudication. Remaining systems are negative.  Physical Exam: Well-developed well-nourished in no acute distress.  Skin is warm and dry.  HEENT is normal.  Neck is supple.  Chest is clear to auscultation with normal expansion.  Cardiovascular exam is irregular, 2/6 systolic murmur LSB, 2/6 systolic murmur apex Abdominal exam nontender or distended. No masses palpated. Extremities show no edema. neuro grossly intact   A/P  1 prior aortic valve replacement-continue SBE prophylaxis.  Repeat echocardiogram.  2 history of severe mitral regurgitation-plan is  medical therapy.  He is not a candidate for MitraClip.  3 permanent atrial fibrillation-plan to continue present dose of metoprolol and digoxin for rate control.  DC coumadin and ASA. Treat with apixaban 5 mg BID.  4 chronic diastolic congestive heart failure-volume status appears to be stable today.  Continue present dose of Lasix.  Continue fluid restriction and low-sodium diet. Check BMET. [place] class 2.  5 coronary artery disease-patient has declined statins previously.  He is not on aspirin given need for Coumadin.  6 hypertension-patient's blood pressure is controlled.  Continue present medications and follow.  Kirk Ruths, MD

## 2018-08-27 ENCOUNTER — Ambulatory Visit: Payer: Medicare Other | Admitting: Cardiology

## 2018-08-27 ENCOUNTER — Ambulatory Visit (INDEPENDENT_AMBULATORY_CARE_PROVIDER_SITE_OTHER): Payer: Medicare Other | Admitting: Pharmacist Clinician (PhC)/ Clinical Pharmacy Specialist

## 2018-08-27 ENCOUNTER — Encounter: Payer: Self-pay | Admitting: Cardiology

## 2018-08-27 VITALS — BP 138/64 | HR 88 | Ht 70.5 in | Wt 169.2 lb

## 2018-08-27 DIAGNOSIS — I10 Essential (primary) hypertension: Secondary | ICD-10-CM | POA: Diagnosis not present

## 2018-08-27 DIAGNOSIS — I2510 Atherosclerotic heart disease of native coronary artery without angina pectoris: Secondary | ICD-10-CM | POA: Diagnosis not present

## 2018-08-27 DIAGNOSIS — Z5181 Encounter for therapeutic drug level monitoring: Secondary | ICD-10-CM

## 2018-08-27 DIAGNOSIS — I340 Nonrheumatic mitral (valve) insufficiency: Secondary | ICD-10-CM

## 2018-08-27 DIAGNOSIS — I482 Chronic atrial fibrillation, unspecified: Secondary | ICD-10-CM | POA: Diagnosis not present

## 2018-08-27 LAB — BASIC METABOLIC PANEL
BUN/Creatinine Ratio: 15 (ref 10–24)
BUN: 14 mg/dL (ref 8–27)
CO2: 23 mmol/L (ref 20–29)
Calcium: 9 mg/dL (ref 8.6–10.2)
Chloride: 99 mmol/L (ref 96–106)
Creatinine, Ser: 0.91 mg/dL (ref 0.76–1.27)
GFR, EST AFRICAN AMERICAN: 87 mL/min/{1.73_m2} (ref >59)
GFR, EST NON AFRICAN AMERICAN: 76 mL/min/{1.73_m2} (ref >59)
Glucose: 95 mg/dL (ref 65–99)
Potassium: 3.9 mmol/L (ref 3.5–5.2)
Sodium: 140 mmol/L (ref 134–144)

## 2018-08-27 LAB — POCT INR: INR: 2.6 (ref 2.0–3.0)

## 2018-08-27 MED ORDER — APIXABAN 5 MG PO TABS: 5.0000 mg | ORAL_TABLET | Freq: Two times a day (BID) | ORAL | 6 refills | Status: DC

## 2018-08-27 NOTE — Patient Instructions (Addendum)
Medication Instructions:  STOP ASPIRIN  STOP WARFARIN AND START ELIQUIS 5 MG TWICE DAILY TOMORROW EVENING If you need a refill on your cardiac medications before your next appointment, please call your pharmacy.   Lab work: Your physician recommends that you HAVE LAB WORK TODAY If you have labs (blood work) drawn today and your tests are completely normal, you will receive your results only by: Marland Kitchen MyChart Message (if you have MyChart) OR . A paper copy in the mail If you have any lab test that is abnormal or we need to change your treatment, we will call you to review the results.  Follow-Up: At [facility], you and your health needs are our priority.  As part of our continuing mission to provide you with exceptional heart care, we have created designated Provider Care Teams.  These Care Teams include your primary Cardiologist (physician) and Advanced Practice Providers (APPs -  Physician Assistants and Nurse Practitioners) who all work together to provide you with the care you need, when you need it. You will need a follow up appointment in 6 months.  Please call our office 2 months in advance to schedule this appointment.  You may see Kirk Ruths, MD or one of the following Advanced Practice Providers on your designated Care Team:   Kerin Ransom, PA-C Roby Lofts, [place] . Sande Rives, PA-C

## 2018-08-28 ENCOUNTER — Encounter: Payer: Self-pay | Admitting: *Deleted

## 2018-09-05 ENCOUNTER — Telehealth (INDEPENDENT_AMBULATORY_CARE_PROVIDER_SITE_OTHER): Payer: Medicare Other | Admitting: Physician Assistant

## 2018-09-05 ENCOUNTER — Ambulatory Visit (HOSPITAL_COMMUNITY): Payer: Medicare Other

## 2018-09-05 ENCOUNTER — Other Ambulatory Visit: Payer: Self-pay | Admitting: Physician Assistant

## 2018-09-05 ENCOUNTER — Ambulatory Visit: Payer: Medicare Other | Admitting: Physician Assistant

## 2018-09-05 DIAGNOSIS — I350 Nonrheumatic aortic (valve) stenosis: Secondary | ICD-10-CM | POA: Diagnosis not present

## 2018-09-05 DIAGNOSIS — Z952 Presence of prosthetic heart valve: Secondary | ICD-10-CM

## 2018-09-05 MED ORDER — DIGOXIN 125 MCG PO TABS: 0.0625 mg | ORAL_TABLET | Freq: Every day | ORAL | 3 refills | Status: DC

## 2018-09-05 NOTE — Telephone Encounter (Addendum)
  [place] VALVE TEAM  Patient contacted about 1 year TAVR follow up. Given Covid-19 pandemic we have asked the patient to not come into the office for appointment to limit exposure. The patient consented to do consult over the phone. The patient, Wayne [name], his wife, and Nell Range PA-C were present during the telephone encounter.   Chief Complaint: 1 year  s/p TAVR.   HPI Wayne [name] is a 85 y.o. male with a history of chronic afib on Eliquis (recently) switched from coumadin), BPH, CAD s/p previous PCI, HTN, mitral stenosis/regurgitation, and severe AS s/p TAVR 09/04/17.  Patient is currently doing well with no new symptoms. No CP. He still has some mild SOB and fatigue. Occasional LE edema well controlled on lasix but no orthopnea or PND. No dizziness or syncope. No blood in stool or urine. No palpitations. He is currently recovering from a recent admission for appendicitis.   [facility]Q completed over the phone.   Assessment and Plan:  Patient has NYHA class II symptoms.  Continue on Eliquis indefinitely. One year echo has been rescheduled to 11/05/18. He understands the continued need for SBE prophylaxis.    Diagnosis: s/p TAVR, ICD Z95.2. Total time spent on phone ~15 minutes.   Angelena Form PA-C  [facility]

## 2018-09-13 ENCOUNTER — Other Ambulatory Visit: Payer: Self-pay | Admitting: Cardiology

## 2018-09-16 NOTE — Telephone Encounter (Signed)
RX Refill 

## 2018-09-19 ENCOUNTER — Telehealth: Payer: Self-pay | Admitting: Cardiology

## 2018-09-19 ENCOUNTER — Telehealth: Payer: Self-pay

## 2018-09-19 NOTE — Telephone Encounter (Signed)
Change Lasix to 40 mg twice daily for 3 days then resume 40 mg daily thereafter. Kirk Ruths, MD

## 2018-09-19 NOTE — Telephone Encounter (Signed)
New Message   Pt c/o Shortness Of Breath: STAT if SOB developed within the last 24 hours or pt is noticeably SOB on the phone  1. Are you currently SOB (can you hear that pt is SOB on the phone)? No not at the moment but earlier he was experiencing SOB.  2. How long have you been experiencing SOB? A couple of days  3. Are you SOB when sitting or when up moving around? Both  4. Are you currently experiencing any other symptoms? NO

## 2018-09-19 NOTE — Telephone Encounter (Signed)
Patient's wife called and states that patient is having SOB since yesterday. States that it will improve and then come back.  Denies any increase in it with walking.  Denies and fevers.  Patient does have a productive cough at night, per wife this is normal for the patient and he takes Mucinex daily to break up congestion.  Patient is not complaining of any other symptoms.  No history of asthma or allergies. No med changes recently. Please review and advise. MPulliam, CMA/RT(R)

## 2018-09-19 NOTE — Telephone Encounter (Signed)
Spoke with Dr. Raliegh Scarlet - noted and informed her of the Cardiologist advise.  Spoke to patient's wife and advise to follow directions given from their office and to follow up as needed. MPulliam, CMA/RT(R)

## 2018-09-19 NOTE — Telephone Encounter (Signed)
Spoke with pt wife, Aware of dr crenshaw's recommendations.  

## 2018-09-19 NOTE — Telephone Encounter (Signed)
Patient was called regarding SOB, spoke with wife who states for the past couple of days he has had SOB episodes on and off- with activities and some at rest. He is having to use two pillows at night to prop up with because of the SOB. He denies any other symptoms, no CP, no swelling (does take his lasix), no fever, does mention a cough at night (which he has had and is taking Mucinex for). They contact their PCP doctor as well and are awaiting response from them, but wanted to call Dr.Crenshaw and make him aware if he had any recommendations at this time.   Patient wife was thankful for the call back.

## 2018-09-20 ENCOUNTER — Encounter: Payer: Self-pay | Admitting: Thoracic Surgery (Cardiothoracic Vascular Surgery)

## 2018-09-20 ENCOUNTER — Encounter: Payer: Self-pay | Admitting: *Deleted

## 2018-10-28 ENCOUNTER — Other Ambulatory Visit: Payer: Self-pay | Admitting: Cardiology

## 2018-11-01 ENCOUNTER — Telehealth (HOSPITAL_COMMUNITY): Payer: Self-pay | Admitting: Radiology

## 2018-11-01 NOTE — Telephone Encounter (Signed)

## 2018-11-05 ENCOUNTER — Other Ambulatory Visit: Payer: Self-pay

## 2018-11-05 ENCOUNTER — Ambulatory Visit (HOSPITAL_COMMUNITY): Payer: Medicare Other | Attending: Cardiology

## 2018-11-05 DIAGNOSIS — Z952 Presence of prosthetic heart valve: Secondary | ICD-10-CM | POA: Diagnosis not present

## 2018-11-20 ENCOUNTER — Ambulatory Visit: Payer: Medicare Other | Admitting: Family Medicine

## 2018-12-10 ENCOUNTER — Ambulatory Visit: Payer: Medicare Other | Admitting: Family Medicine

## 2019-01-27 ENCOUNTER — Other Ambulatory Visit: Payer: Self-pay | Admitting: Adult Health

## 2019-01-27 ENCOUNTER — Other Ambulatory Visit: Payer: Self-pay | Admitting: Cardiology

## 2019-03-03 DIAGNOSIS — D1801 Hemangioma of skin and subcutaneous tissue: Secondary | ICD-10-CM | POA: Diagnosis not present

## 2019-03-03 DIAGNOSIS — Z85828 Personal history of other malignant neoplasm of skin: Secondary | ICD-10-CM | POA: Diagnosis not present

## 2019-03-03 DIAGNOSIS — L821 Other seborrheic keratosis: Secondary | ICD-10-CM | POA: Diagnosis not present

## 2019-03-03 DIAGNOSIS — L570 Actinic keratosis: Secondary | ICD-10-CM | POA: Diagnosis not present

## 2019-03-03 DIAGNOSIS — B079 Viral wart, unspecified: Secondary | ICD-10-CM | POA: Diagnosis not present

## 2019-03-11 DIAGNOSIS — R351 Nocturia: Secondary | ICD-10-CM | POA: Diagnosis not present

## 2019-03-11 DIAGNOSIS — N401 Enlarged prostate with lower urinary tract symptoms: Secondary | ICD-10-CM | POA: Diagnosis not present

## 2019-03-23 ENCOUNTER — Other Ambulatory Visit: Payer: Self-pay | Admitting: Cardiology

## 2019-03-23 DIAGNOSIS — I482 Chronic atrial fibrillation, unspecified: Secondary | ICD-10-CM

## 2019-04-07 ENCOUNTER — Other Ambulatory Visit: Payer: Self-pay | Admitting: Cardiology

## 2019-04-11 NOTE — Progress Notes (Signed)
Virtual Visit via Video Note   This visit type was conducted due to national recommendations for restrictions regarding the COVID-19 Pandemic (e.g. social distancing) in an effort to limit this patient's exposure and mitigate transmission in our community.  Due to his co-morbid illnesses, this patient is at least at moderate risk for complications without adequate follow up.  This format is felt to be most appropriate for this patient at this time.  All issues noted in this document were discussed and addressed.  A limited physical exam was performed with this format.  Please refer to the patient's chart for his consent to telehealth for [facility].   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 Underwent TAVR 3/19.He is felt not to be candidate for MitraClip for mitral regurgitation. Most recent echocardiogram May 2020 showed normal LV function, moderate left ventricular hypertrophy, biatrial enlargement, at least moderate mitral regurgitation, prior aortic valve replacement with  mean gradient 7 mmHg and no aortic insufficiency.  Since last seen,patient has occasional dyspnea unchanged.  No chest pain or syncope.  No pedal edema.  The patient does not have symptoms concerning for COVID-19 infection (fever, chills, cough, or new shortness of breath).    Past Medical History:  Diagnosis Date   Anxiety    "maybe; having trouble at night" (04/24/2018)   Aortic stenosis    Arthritis    Atrial fibrillation, chronic (HCC)    BPH (benign prostatic hyperplasia)    CAD (coronary artery disease)    Chronic anticoagulation    Heart murmur    Hepatitis 1960s   "drank contaminated water" (04/23/2018)   History of chicken pox    HTN (hypertension)    "have never had high blood pressure" (04/23/2018)   Mitral stenosis    Old MI (myocardial infarction) 2007   s/p PCI to distal OM (no stent)   Pneumonia    "1 time" (04/23/2018)   Severe mitral regurgitation    Stroke, 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[place])    [id number] weeks after his heart attack; "weak all over since" (04/23/2018)   Past Surgical History:  Procedure Laterality Date   CATARACT EXTRACTION W/ INTRAOCULAR LENS IMPLANT Left    CORONARY ANGIOPLASTY  2007   Distal OM   LAPAROSCOPIC APPENDECTOMY N/A 06/22/2018   Procedure: APPENDECTOMY LAPAROSCOPIC;  Surgeon: Georganna Skeans, MD;  Location: [facility];  Service: General;  Laterality: N/A;   PROSTATE BIOPSY     RIGHT/LEFT HEART CATH AND CORONARY ANGIOGRAPHY N/A 08/01/2017   Procedure: RIGHT/LEFT HEART CATH AND CORONARY ANGIOGRAPHY;  Surgeon: Sherren Mocha, MD;  Location: [facility];  Service: Cardiovascular;  Laterality: N/A;   TEE WITHOUT CARDIOVERSION N/A 07/20/2017   Procedure: TRANSESOPHAGEAL ECHOCARDIOGRAM (TEE);  Surgeon: Lelon Perla, MD;  Location: [facility]  ENDOSCOPY;  Service: Cardiovascular;  Laterality: N/A;   TEE WITHOUT CARDIOVERSION N/A 09/04/2017   Procedure: TRANSESOPHAGEAL ECHOCARDIOGRAM (TEE);  Surgeon: Sherren Mocha, MD;  Location: [facility];   Service: Open Heart Surgery;  Laterality: N/A;   TRANSCATHETER AORTIC VALVE REPLACEMENT, TRANSFEMORAL N/A 09/04/2017   Procedure: TRANSCATHETER AORTIC VALVE REPLACEMENT, TRANSFEMORAL;  Surgeon: Sherren Mocha, MD;  Location: [facility];  Service: Open Heart Surgery;  Laterality: N/A;     Current Meds  Medication Sig   acetaminophen (TYLENOL) 500 MG tablet Take 500 mg by mouth every 6 (six) hours as needed for moderate pain or headache.    alfuzosin (UROXATRAL) 10 MG 24 hr tablet Take 10 mg by mouth daily with breakfast.   ARTIFICIAL TEAR OP Place 1 drop into both eyes daily as needed (dry eyes).   digoxin (LANOXIN) 0.125 MG tablet Take 1 tablet by mouth once daily   ELIQUIS 5 MG TABS tablet Take 1 tablet by mouth twice daily   finasteride (PROSCAR) 5 MG tablet Take 5 mg by mouth at bedtime.    furosemide (LASIX) 20 MG tablet Take 2 tablets by mouth once daily   Melatonin 10 MG TABS Take 10 mg by mouth at bedtime.    Menthol, Topical Analgesic, (BLUE-EMU MAXIMUM STRENGTH EX) Apply 1 application topically 3 (three) times daily as needed (muscle pain in back). alterrnates between [employer] and Blu Emu   metoprolol tartrate (LOPRESSOR) 25 MG tablet Take 0.5 tablets (12.5 mg total) by mouth 2 (two) times daily. (Patient taking differently: Take 12.5 mg by mouth 2 (two) times daily. 25 mg in the morning and 12.5 mg in the evening)   polyethylene glycol (MIRALAX / GLYCOLAX) packet Take 17 g by mouth daily as needed for mild constipation.   potassium chloride (K-DUR,KLOR-CON) 10 MEQ tablet Take 1 tablet (10 mEq total) by mouth daily.   sodium chloride (OCEAN) 0.65 % SOLN nasal spray Place 1 spray into both nostrils daily as needed for congestion.   triamcinolone cream (KENALOG) 0.1 % Apply 1 application topically daily as needed (dry skin).    [DISCONTINUED] docusate sodium (COLACE) 100 MG capsule Take 100 mg by mouth every evening.   [DISCONTINUED] potassium chloride (K-DUR) 10 MEQ tablet  TAKE 1 TABLET BY MOUTH ONCE DAILY AS NEEDED(TAKE POTASSIUM IF YOU TAKE LASIX) (Patient taking differently: Take 10 mEq by mouth daily. )     Allergies:   Penicillins, Prednisone, Procaine hcl, Levofloxacin, and Oxycodone   Social History   Tobacco Use   Smoking status: Never Smoker   Smokeless tobacco: Never Used  Substance Use Topics   Alcohol use: Never    Frequency: Never   Drug use: Never     Family Hx: The patient's family history includes Heart attack in his brother, father, mother, and sister; Heart disease in his father and mother; Stroke in his brother.  ROS:   Please see the history of present illness.    No Fever, chills  or productive cough; frequent urination. All other systems reviewed and are negative.   Recent Labs: 06/27/2018: Magnesium 2.0 07/17/2018: ALT 9; Hemoglobin 13.8; Platelets 184 08/27/2018: BUN 14; Creatinine, Ser 0.91; Potassium 3.9; Sodium 140   Recent Lipid Panel Lab Results  Component Value Date/Time   CHOL 176 01/27/2016 09:38 AM   TRIG 274 (H) 01/27/2016 09:38 AM   HDL 29 (L) 01/27/2016 09:38 AM   CHOLHDL 6.1 (H) 01/27/2016 09:38 AM   LDLCALC 92 01/27/2016 09:38 AM   LDLDIRECT 87.0 05/18/2014 09:22 AM  Wt Readings from Last 3 Encounters:  04/14/19 174 lb (78.9 kg)  08/27/18 169 lb 3.2 oz (76.7 kg)  07/17/18 162 lb 9.6 oz (73.8 kg)     Objective:    Vital Signs:  BP 120/74    Pulse 79    Ht 5' 10.5" (1.791 m)    Wt 174 lb (78.9 kg)    BMI 24.61 kg/m    VITAL SIGNS:  reviewed NAD Answers questions appropriately Normal affect Remainder of physical examination not performed (telehealth visit; coronavirus pandemic)  ASSESSMENT & PLAN:    1. Previous aortic valve replacement-continue SBE prophylaxis. 2. Severe mitral regurgitation-mitral regurgitation moderate on most recent echocardiogram but eccentric and difficult to quantitate; likely severe.  He is not a candidate for MitraClip.  Continue medical therapy. 3. Permanent  atrial fibrillation-continue metoprolol and digoxin for rate control.  Continue apixaban.  Check hemoglobin and renal function.  Patient is to start Myrbetrig; will check digoxin level in one week and decrease dose if needed. 4. Chronic diastolic congestive heart failure-based on history he is doing well from a volume standpoint.  Continue present dose of Lasix.  Check potassium and renal function. 5. Coronary artery disease-patient is not on aspirin given need for apixaban.  Declined statins previously. 6. Hypertension-blood pressure controlled.  Continue present medications.  COVID-19 Education: The importance of social distancing was discussed today.  Time:   Today, I have spent 18 minutes with the patient with telehealth technology discussing the above problems.     Medication Adjustments/Labs and Tests Ordered: Current medicines are reviewed at length with the patient today.  Concerns regarding medicines are outlined above.   Tests Ordered: No orders of the defined types were placed in this encounter.   Medication Changes: No orders of the defined types were placed in this encounter.   Follow Up:  Either In Person or Virtual in 6 month(s)  Signed, Kirk Ruths, MD  04/14/2019 10:50 AM    [place]

## 2019-04-14 ENCOUNTER — Telehealth (INDEPENDENT_AMBULATORY_CARE_PROVIDER_SITE_OTHER): Payer: Medicare Other | Admitting: Cardiology

## 2019-04-14 ENCOUNTER — Encounter: Payer: Self-pay | Admitting: Cardiology

## 2019-04-14 VITALS — BP 120/74 | HR 79 | Ht 70.5 in | Wt 174.0 lb

## 2019-04-14 DIAGNOSIS — Z952 Presence of prosthetic heart valve: Secondary | ICD-10-CM

## 2019-04-14 DIAGNOSIS — I482 Chronic atrial fibrillation, unspecified: Secondary | ICD-10-CM | POA: Diagnosis not present

## 2019-04-14 DIAGNOSIS — I340 Nonrheumatic mitral (valve) insufficiency: Secondary | ICD-10-CM

## 2019-04-14 DIAGNOSIS — I2510 Atherosclerotic heart disease of native coronary artery without angina pectoris: Secondary | ICD-10-CM

## 2019-04-14 NOTE — Patient Instructions (Signed)
Medication Instructions:  NO CHANGE *If you need a refill on your cardiac medications before your next appointment, please call your pharmacy*  Lab Work: Your physician recommends that you return for lab work in: [place]  If you have labs (blood work) drawn today and your tests are completely normal, you will receive your results only by: Marland Kitchen MyChart Message (if you have MyChart) OR . A paper copy in the mail If you have any lab test that is abnormal or we need to change your treatment, we will call you to review the results.  Follow-Up: At [facility], you and your health needs are our priority.  As part of our continuing mission to provide you with exceptional heart care, we have created designated Provider Care Teams.  These Care Teams include your primary Cardiologist (physician) and Advanced Practice Providers (APPs -  Physician Assistants and Nurse Practitioners) who all work together to provide you with the care you need, when you need it.  Your next appointment:   6 months  The format for your next appointment:   In Person  Provider:   Kirk Ruths, MD

## 2019-04-22 ENCOUNTER — Other Ambulatory Visit: Payer: Self-pay | Admitting: Cardiology

## 2019-04-22 DIAGNOSIS — I482 Chronic atrial fibrillation, unspecified: Secondary | ICD-10-CM

## 2019-04-22 NOTE — Telephone Encounter (Signed)
Rx request sent to pharmacy.  

## 2019-04-23 ENCOUNTER — Encounter: Payer: Self-pay | Admitting: *Deleted

## 2019-04-23 LAB — CBC
Hematocrit: 40.4 % (ref 37.5–51.0)
Hemoglobin: 13.3 g/dL (ref 13.0–17.7)
MCH: 29.8 pg (ref 26.6–33.0)
MCHC: 32.9 g/dL (ref 31.5–35.7)
MCV: 90 fL (ref 79–97)
Platelets: 140 10*3/uL — ABNORMAL LOW (ref 150–450)
RBC: 4.47 x10E6/uL (ref 4.14–5.80)
RDW: 15 % (ref 11.6–15.4)
WBC: 5.5 10*3/uL (ref 3.4–10.8)

## 2019-04-23 LAB — BASIC METABOLIC PANEL
BUN/Creatinine Ratio: 12 (ref 10–24)
BUN: 14 mg/dL (ref 8–27)
CO2: 22 mmol/L (ref 20–29)
Calcium: 8.8 mg/dL (ref 8.6–10.2)
Chloride: 101 mmol/L (ref 96–106)
Creatinine, Ser: 1.13 mg/dL (ref 0.76–1.27)
GFR calc Af Amer: 67 mL/min/{1.73_m2} (ref >59)
GFR calc non Af Amer: 58 mL/min/{1.73_m2} — ABNORMAL LOW (ref >59)
Glucose: 130 mg/dL — ABNORMAL HIGH (ref 65–99)
Potassium: 4.6 mmol/L (ref 3.5–5.2)
Sodium: 143 mmol/L (ref 134–144)

## 2019-04-23 LAB — DIGOXIN LEVEL: Digoxin, Serum: <0.4 ng/mL — ABNORMAL LOW (ref 0.5–0.9)

## 2019-05-12 ENCOUNTER — Telehealth: Payer: Self-pay | Admitting: Cardiology

## 2019-05-12 NOTE — Telephone Encounter (Signed)
° ° ° °*  STAT* If patient is at the pharmacy, call can be transferred to refill team.   1. Which medications need to be refilled? (please list name of each medication and dose if known) furosemide (LASIX) 20 MG tablet  2. Which pharmacy/location (including street and city if local pharmacy) is medication to be sent to?[facility] ([place]), [place] [address]  3. Do they need a 30 day or 90 day supply? [place]

## 2019-05-16 ENCOUNTER — Other Ambulatory Visit: Payer: Self-pay | Admitting: Cardiology

## 2019-05-16 MED ORDER — FUROSEMIDE 20 MG PO TABS: 40.0000 mg | ORAL_TABLET | Freq: Every day | ORAL | 1 refills | Status: DC

## 2019-05-19 ENCOUNTER — Other Ambulatory Visit: Payer: Self-pay | Admitting: Cardiology

## 2019-05-19 DIAGNOSIS — I482 Chronic atrial fibrillation, unspecified: Secondary | ICD-10-CM

## 2019-05-23 ENCOUNTER — Other Ambulatory Visit: Payer: Self-pay | Admitting: Cardiology

## 2019-05-23 DIAGNOSIS — I482 Chronic atrial fibrillation, unspecified: Secondary | ICD-10-CM

## 2019-06-05 DIAGNOSIS — Z0120 Encounter for dental examination and cleaning without abnormal findings: Secondary | ICD-10-CM | POA: Diagnosis not present

## 2019-06-24 DIAGNOSIS — Z0120 Encounter for dental examination and cleaning without abnormal findings: Secondary | ICD-10-CM | POA: Diagnosis not present

## 2019-07-07 ENCOUNTER — Telehealth: Payer: Self-pay | Admitting: Family Medicine

## 2019-07-07 NOTE — Telephone Encounter (Signed)
Unfortunately I am not sure how to facilitate this. I would have patients daughter call the COVID vaccine scheduling line at [phone] and pled her case to them and see if they can help facilitate patient needs.

## 2019-07-07 NOTE — Telephone Encounter (Signed)
Pt's daughter/Beth called states she has placed pt on Wait List for COVID vaccine but understand they have to stand in line to wait their turn(pt can't stand for very longer) may required wheelchair or to have nurse come to car .  --forwarding message to med asst to see if we can arrange that(Per pt's daugther request)  --glh

## 2019-07-19 ENCOUNTER — Other Ambulatory Visit: Payer: Self-pay | Admitting: Cardiology

## 2019-07-21 NOTE — Telephone Encounter (Signed)
Ok to refill Wayne [name]  

## 2019-07-23 ENCOUNTER — Telehealth: Payer: Self-pay | Admitting: Family Medicine

## 2019-07-23 NOTE — Telephone Encounter (Signed)
Noted. AS, CMA 

## 2019-07-23 NOTE — Telephone Encounter (Signed)
Pt's daughter was worried about father standing in long line @ [facility] for [place] took him instead to [facility] in [place] (wanted provider to know)--glh

## 2019-07-25 ENCOUNTER — Other Ambulatory Visit: Payer: Self-pay | Admitting: Cardiology

## 2019-07-29 ENCOUNTER — Telehealth: Payer: Self-pay | Admitting: Cardiology

## 2019-07-29 NOTE — Telephone Encounter (Signed)
New Message   *STAT* If patient is at the pharmacy, call can be transferred to refill team.   1. Which medications need to be refilled? (please list name of each medication and dose if known) metoprolol tartrate (LOPRESSOR) 25 MG tablet  2. Which pharmacy/location (including street and city if local pharmacy) is medication to be sent to? [facility] ([place]), [place] - [address]  3. Do they need a 30 day or 90 day supply? 90 day   Patient is completely out of the medication and needs it today. Please give patient's daughter a call back once medication has been sent to pharmacy.

## 2019-07-29 NOTE — Telephone Encounter (Signed)
Medication was sent over to the pharmacy on 07/21/2019 for a year supply.

## 2019-07-30 ENCOUNTER — Other Ambulatory Visit: Payer: Self-pay | Admitting: Cardiology

## 2019-07-30 MED ORDER — METOPROLOL TARTRATE 25 MG PO TABS: 12.5000 mg | ORAL_TABLET | Freq: Two times a day (BID) | ORAL | 2 refills | Status: AC

## 2019-07-30 NOTE — Addendum Note (Signed)
Addended by: Jones Broom on: 07/30/2019 10:32 AM   Modules accepted: Orders

## 2019-07-31 ENCOUNTER — Other Ambulatory Visit: Payer: Self-pay | Admitting: Physician Assistant

## 2019-08-03 ENCOUNTER — Ambulatory Visit: Payer: Medicare Other

## 2019-10-14 ENCOUNTER — Telehealth (INDEPENDENT_AMBULATORY_CARE_PROVIDER_SITE_OTHER): Payer: Medicare Other | Admitting: Cardiology

## 2019-10-14 ENCOUNTER — Encounter: Payer: Self-pay | Admitting: Cardiology

## 2019-10-14 VITALS — BP 132/80 | HR 91 | Ht 70.0 in | Wt 175.8 lb

## 2019-10-14 DIAGNOSIS — I340 Nonrheumatic mitral (valve) insufficiency: Secondary | ICD-10-CM

## 2019-10-14 DIAGNOSIS — I5032 Chronic diastolic (congestive) heart failure: Secondary | ICD-10-CM

## 2019-10-14 DIAGNOSIS — I482 Chronic atrial fibrillation, unspecified: Secondary | ICD-10-CM | POA: Diagnosis not present

## 2019-10-14 DIAGNOSIS — Z952 Presence of prosthetic heart valve: Secondary | ICD-10-CM

## 2019-10-14 DIAGNOSIS — I2510 Atherosclerotic heart disease of native coronary artery without angina pectoris: Secondary | ICD-10-CM

## 2019-10-14 NOTE — Patient Instructions (Signed)
Medication Instructions:   Your physician recommends that you continue on your current medications as directed. Please refer to the Current Medication list given to you today.  *If you need a refill on your cardiac medications before your next appointment, please call your pharmacy*   Lab Work: BMET  AS SOON A YOU CAN   If you have labs (blood work) drawn today and your tests are completely normal, you will receive your results only by: Marland Kitchen MyChart Message (if you have MyChart) OR . A paper copy in the mail If you have any lab test that is abnormal or we need to change your treatment, we will call you to review the results.   Testing/Procedures: NONE ORDERED  TODAY   Follow-Up: At [facility], you and your health needs are our priority.  As part of our continuing mission to provide you with exceptional heart care, we have created designated Provider Care Teams.  These Care Teams include your primary Cardiologist (physician) and Advanced Practice Providers (APPs -  Physician Assistants and Nurse Practitioners) who all work together to provide you with the care you need, when you need it.  We recommend signing up for the patient portal called "MyChart".  Sign up information is provided on this After Visit Summary.  MyChart is used to connect with patients for Virtual Visits (Telemedicine).  Patients are able to view lab/test results, encounter notes, upcoming appointments, etc.  Non-urgent messages can be sent to your provider as well.   To learn more about what you can do with MyChart, go to [email or web address].    Your next appointment:   1 year(s)  The format for your next appointment:   In Person  Provider:   You may see Kirk Ruths, MD or one of the following Advanced Practice Providers on your designated Care Team:    Kerin Ransom, PA-C  [place], [place]  Coletta Memos, [place]    Other Instructions

## 2019-10-14 NOTE — Progress Notes (Signed)
Virtual Visit via Telephone Note   This visit type was conducted due to national recommendations for restrictions regarding the COVID-19 Pandemic (e.g. social distancing) in an effort to limit this patient's exposure and mitigate transmission in our community.  Due to his co-morbid illnesses, this patient is at least at moderate risk for complications without adequate follow up.  This format is felt to be most appropriate for this patient at this time.  The patient did not have access to video technology/had technical difficulties with video requiring transitioning to audio format only (telephone).  All issues noted in this document were discussed and addressed.  No physical exam could be performed with this format.  Please refer to the patient's chart for his  consent to telehealth for [facility].   The patient was identified using 2 identifiers.  Date:  10/14/2019   ID:  Wayne [name], DOB Mar 22, 1930, MRN [id number]  Patient Location: Home Provider Location: Home  PCP:  Lorrene Reid, PA-C  Cardiologist:  Kirk Ruths, MD  Electrophysiologist:  None   Evaluation Performed:  Follow-Up Visit  Chief Complaint:  none  History of Present Illness:    Wayne [name] is a 86 y.o. male with a history of coronary disease and aortic stenosis.  He had an OM PCI in 2007.  He has severe AS and had TAVR March 2019.  Catheterization February 2019 showed patency of his OM PCI site.  In addition to severe aortic stenosis he also had severe MR and was not felt to be a candidate for mitral valve clip.  His last echocardiogram in May 2020 showed normal LV function and moderate MR.  Other medical issues include history of CVA, hypertension, and chronic atrial fibrillation on Eliquis.  According to office notes he is previously declined statin therapy.  His last office visit with Dr. Stanford Breed was in October 2020.  Patient was contacted today for routine 35-month follow-up.  I spoke to  the patient and his wife on the phone.  His activity apparently is pretty limited.  He is able to get around with a walker and they now have a lift chair for there are stairs at home.  At night he and his wife play Scrabble.  He denies any new dyspnea, he does have chronic dyspnea with minimal exertion.  His only complaint with his medications is frequent urination which keeps him up at night.  He is on 80 of Lasix daily and I suggested we draw a BMP, his last lab was in Nov 2020.   The patient does not have symptoms concerning for COVID-19 infection (fever, chills, cough, or new shortness of breath).    Past Medical History:  Diagnosis Date  . Anxiety    "maybe; having trouble at night" (04/24/2018)  . Aortic stenosis   . Arthritis   . Atrial fibrillation, chronic ([place])   . BPH (benign prostatic hyperplasia)   . CAD (coronary artery disease)   . Chronic anticoagulation   . Heart murmur   . Hepatitis 1960s   "drank contaminated water" (04/23/2018)  . History of chicken pox   . HTN (hypertension)    "have never had high blood pressure" (04/23/2018)  . Mitral stenosis   . Old MI (myocardial infarction) 2007   s/p PCI to distal OM (no stent)  . Pneumonia    "1 time" (04/23/2018)  . Severe mitral regurgitation   . 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 CATARACT EXTRACTION W/ INTRAOCULAR LENS IMPLANT Left   . CORONARY ANGIOPLASTY  2007   Distal OM  . LAPAROSCOPIC APPENDECTOMY N/A 06/22/2018   Procedure: APPENDECTOMY LAPAROSCOPIC;  Surgeon: Georganna Skeans, MD;  Location: [place];  Service: General;  Laterality: N/A;  . PROSTATE BIOPSY    . RIGHT/LEFT HEART CATH AND CORONARY ANGIOGRAPHY N/A 08/01/2017   Procedure: RIGHT/LEFT HEART CATH AND CORONARY ANGIOGRAPHY;  Surgeon: Sherren Mocha, MD;  Location: [facility];  Service: Cardiovascular;  Laterality: N/A;  . TEE WITHOUT  CARDIOVERSION N/A 07/20/2017   Procedure: TRANSESOPHAGEAL ECHOCARDIOGRAM (TEE);  Surgeon: Lelon Perla, MD;  Location: [facility] ENDOSCOPY;  Service: Cardiovascular;  Laterality: N/A;  . TEE WITHOUT CARDIOVERSION N/A 09/04/2017   Procedure: TRANSESOPHAGEAL ECHOCARDIOGRAM (TEE);  Surgeon: Sherren Mocha, MD;  Location: [place];  Service: Open Heart Surgery;  Laterality: N/A;  . TRANSCATHETER AORTIC VALVE REPLACEMENT, TRANSFEMORAL N/A 09/04/2017   Procedure: TRANSCATHETER AORTIC VALVE REPLACEMENT, TRANSFEMORAL;  Surgeon: Sherren Mocha, MD;  Location: [place];  Service: Open Heart Surgery;  Laterality: N/A;     Current Meds  Medication Sig  . acetaminophen (TYLENOL) 500 MG tablet Take 500 mg by mouth every 6 (six) hours as needed for moderate pain or headache.   . alfuzosin (UROXATRAL) 10 MG 24 hr tablet Take 10 mg by mouth daily with breakfast.  . ARTIFICIAL TEAR OP Place 1 drop into both eyes daily as needed (dry eyes).  Marland Kitchen digoxin (LANOXIN) 0.125 MG tablet Take 1 tablet by mouth once daily  . ELIQUIS 5 MG TABS tablet Take 1 tablet by mouth twice daily  . finasteride (PROSCAR) 5 MG tablet Take 5 mg by mouth at bedtime.   . furosemide (LASIX) 20 MG tablet Take 2 tablets (40 mg total) by mouth daily.  . Melatonin 10 MG TABS Take 10 mg by mouth at bedtime.   . Menthol, Topical Analgesic, (BLUE-EMU MAXIMUM STRENGTH EX) Apply 1 application topically 3 (three) times daily as needed (muscle pain in back). alterrnates between [employer] and Blu Emu  . metoprolol tartrate (LOPRESSOR) 25 MG tablet Take 0.5 tablets (12.5 mg total) by mouth 2 (two) times daily. 25 mg in the morning and 12.5 mg in the evening  . polyethylene glycol (MIRALAX / GLYCOLAX) packet Take 17 g by mouth daily as needed for mild constipation.  . potassium chloride (K-DUR,KLOR-CON) 10 MEQ tablet Take 1 tablet (10 mEq total) by mouth daily.  . sodium chloride (OCEAN) 0.65 % SOLN nasal spray Place 1 spray into both nostrils daily as needed for  congestion.  . triamcinolone cream (KENALOG) 0.1 % Apply 1 application topically daily as needed (dry skin).   . [DISCONTINUED] potassium chloride (KLOR-CON) 10 MEQ tablet TAKE 1 TABLET BY MOUTH ONCE DAILY AS  NEEDED  IF  YOU  TAKE  LASIX     Allergies:   Penicillins, Prednisone, Procaine hcl, Levofloxacin, and Oxycodone   Social History   Tobacco Use  . Smoking status: Never Smoker  . Smokeless tobacco: Never Used  Substance Use Topics  . Alcohol use: Never  . Drug use: Never     Family Hx: The patient's family history includes Heart attack in his brother, father, mother, and sister; Heart disease in his father and mother; Stroke in his brother.  ROS:   Please see the history of present illness.    All other systems reviewed and are negative.   Prior CV studies:  The following studies were reviewed today: Echo may 2020  Labs/Other Tests and Data Reviewed:    EKG:  An ECG dated 07/08/2018 was personally reviewed today and demonstrated:  AF with VR 110- PVCs  Recent Labs: 04/22/2019: BUN 14; Creatinine, Ser 1.13; Hemoglobin 13.3; Platelets 140; Potassium 4.6; Sodium 143   Recent Lipid Panel Lab Results  Component Value Date/Time   CHOL 176 01/27/2016 09:38 AM   TRIG 274 (H) 01/27/2016 09:38 AM   HDL 29 (L) 01/27/2016 09:38 AM   CHOLHDL 6.1 (H) 01/27/2016 09:38 AM   LDLCALC 92 01/27/2016 09:38 AM   LDLDIRECT 87.0 05/18/2014 09:22 AM    Wt Readings from Last 3 Encounters:  10/14/19 175 lb 12.8 oz (79.7 kg)  04/14/19 174 lb (78.9 kg)  08/27/18 169 lb 3.2 oz (76.7 kg)     Objective:    Vital Signs:  BP 132/80   Pulse 91   Ht 5\' 10"  (1.778 m)   Wt 175 lb 12.8 oz (79.7 kg)   BMI 25.22 kg/m    VITAL SIGNS:  reviewed  ASSESSMENT & PLAN:    CAD- H/O OM PCI in 2007- patent at cath 2019  S/P TAVR- March 2019- echo May 2020 showed normal valve function.  Moderate MR- Not a candidate for MV clip- medical rx  CAF- Rate control- Eliquis  Plan- Same Rx for  now, check BMP to see if Lasix needs adjustment.  Not sure another echo in May 2021 would change Rx plan so I did not order it.  F/U with Dr Stanford Breed in Oct 2021.  COVID-19 Education: The signs and symptoms of COVID-19 were discussed with the patient and how to seek care for testing (follow up with PCP or arrange E-visit).  The importance of social distancing was discussed today.  Time:   Today, I have spent 15 minutes with the patient with telehealth technology discussing the above problems.     Medication Adjustments/Labs and Tests Ordered: Current medicines are reviewed at length with the patient today.  Concerns regarding medicines are outlined above.   Tests Ordered: No orders of the defined types were placed in this encounter.   Medication Changes: No orders of the defined types were placed in this encounter.   Follow Up:  In Person in Oct with Dr Stanford Breed.   Signed, Kerin Ransom, PA-C  10/14/2019 1:48 PM    [facility]

## 2019-10-20 ENCOUNTER — Other Ambulatory Visit: Payer: Self-pay | Admitting: Cardiology

## 2019-10-21 DIAGNOSIS — I2510 Atherosclerotic heart disease of native coronary artery without angina pectoris: Secondary | ICD-10-CM | POA: Diagnosis not present

## 2019-10-21 DIAGNOSIS — I482 Chronic atrial fibrillation, unspecified: Secondary | ICD-10-CM | POA: Diagnosis not present

## 2019-10-21 DIAGNOSIS — I5032 Chronic diastolic (congestive) heart failure: Secondary | ICD-10-CM | POA: Diagnosis not present

## 2019-10-22 LAB — BASIC METABOLIC PANEL
BUN/Creatinine Ratio: 14 (ref 10–24)
BUN: 15 mg/dL (ref 8–27)
CO2: 23 mmol/L (ref 20–29)
Calcium: 8.9 mg/dL (ref 8.6–10.2)
Chloride: 101 mmol/L (ref 96–106)
Creatinine, Ser: 1.09 mg/dL (ref 0.76–1.27)
GFR calc Af Amer: 70 mL/min/{1.73_m2} (ref >59)
GFR calc non Af Amer: 60 mL/min/{1.73_m2} (ref >59)
Glucose: 152 mg/dL — ABNORMAL HIGH (ref 65–99)
Potassium: 3.8 mmol/L (ref 3.5–5.2)
Sodium: 141 mmol/L (ref 134–144)

## 2019-10-23 ENCOUNTER — Other Ambulatory Visit: Payer: Self-pay

## 2019-10-23 MED ORDER — POTASSIUM CHLORIDE CRYS ER 10 MEQ PO TBCR: 10.0000 meq | EXTENDED_RELEASE_TABLET | Freq: Every day | ORAL | 3 refills | Status: DC

## 2019-10-24 ENCOUNTER — Telehealth: Payer: Self-pay | Admitting: Adult Health

## 2019-10-24 NOTE — Telephone Encounter (Signed)
Patient's daughter called requesting a refill for the patient's potassium. I informed her it was received by the pharmacy yesterday.

## 2019-10-30 DIAGNOSIS — H10022 Other mucopurulent conjunctivitis, left eye: Secondary | ICD-10-CM | POA: Diagnosis not present

## 2019-10-30 DIAGNOSIS — H2511 Age-related nuclear cataract, right eye: Secondary | ICD-10-CM | POA: Diagnosis not present

## 2019-10-30 DIAGNOSIS — B0239 Other herpes zoster eye disease: Secondary | ICD-10-CM | POA: Diagnosis not present

## 2019-10-30 DIAGNOSIS — Z961 Presence of intraocular lens: Secondary | ICD-10-CM | POA: Diagnosis not present

## 2019-11-07 ENCOUNTER — Other Ambulatory Visit: Payer: Self-pay | Admitting: *Deleted

## 2019-11-10 ENCOUNTER — Other Ambulatory Visit: Payer: Self-pay | Admitting: Adult Health

## 2019-11-10 NOTE — Telephone Encounter (Signed)
New Message   *STAT* If patient is at the pharmacy, call can be transferred to refill team.   1. Which medications need to be refilled? (please list name of each medication and dose if known) furosemide (LASIX) 20 MG tablet   2. Which pharmacy/location (including street and city if local pharmacy) is medication to be sent to? [place] ([place]), [place] - [address]   3. Do they need a 30 day or 90 day supply?  180 day supply  Patient needs medication ASAP. Totally out of medication

## 2019-11-10 NOTE — Telephone Encounter (Signed)
*  STAT* If patient is at the pharmacy, call can be transferred to refill team.   1. Which medications need to be refilled? (please list name of each medication and dose if known) furosemide (LASIX) 20 MG tablet  2. Which pharmacy/location (including street and city if local pharmacy) is medication to be sent to? [id number] [place] ([place]), [place] [address]   3. Do they need a 30 day or 90 day supply? 90 day supply

## 2019-11-11 ENCOUNTER — Telehealth: Payer: Self-pay

## 2019-11-11 ENCOUNTER — Other Ambulatory Visit: Payer: Self-pay

## 2019-11-11 MED ORDER — FUROSEMIDE 20 MG PO TABS: 40.0000 mg | ORAL_TABLET | Freq: Every day | ORAL | 3 refills | Status: DC

## 2019-11-11 NOTE — Telephone Encounter (Signed)
Received a call from patient's daughter requesting lasix refill.Refill sent to pharmacy.

## 2019-11-13 DIAGNOSIS — B0239 Other herpes zoster eye disease: Secondary | ICD-10-CM | POA: Diagnosis not present

## 2019-11-13 DIAGNOSIS — Z961 Presence of intraocular lens: Secondary | ICD-10-CM | POA: Diagnosis not present

## 2019-11-13 DIAGNOSIS — H10022 Other mucopurulent conjunctivitis, left eye: Secondary | ICD-10-CM | POA: Diagnosis not present

## 2019-11-13 DIAGNOSIS — H2511 Age-related nuclear cataract, right eye: Secondary | ICD-10-CM | POA: Diagnosis not present

## 2019-11-14 ENCOUNTER — Telehealth: Payer: Self-pay | Admitting: Cardiology

## 2019-11-14 NOTE — Telephone Encounter (Signed)
Spoke with patients daughter, patient has been dealing with shingles for last couple weeks Today at lunch had to call 911 and was evaluated by EMS Had episode that lasted about 3-5 minutes where he could not communicate Had nausea and chest pian Patient did not want to go to ED, sleeping now  MEND test and EKG unremarkable Daughter wanted for office to know Scheduled follow up next week with Denyse Amass Daughter will take to ED or call 911 if worse

## 2019-11-14 NOTE — Telephone Encounter (Signed)
Pt, wife and daughter were eating. Felt like he was going to throw up, and put hand over chest and said he needed help. 911 was called. Pt leaned over, couldn't sit upright. EKG done, fire advised it was fine. Stroke assessment done as well and they said it was fine too. Pt at home now, did not want to go to the hospital. Pt's daughter said pt is very weak and in bed resting.

## 2019-11-19 ENCOUNTER — Encounter: Payer: Self-pay | Admitting: General Practice

## 2019-11-19 ENCOUNTER — Telehealth: Payer: Self-pay | Admitting: Radiology

## 2019-11-19 ENCOUNTER — Ambulatory Visit: Payer: Medicare Other | Admitting: General Practice

## 2019-11-19 ENCOUNTER — Other Ambulatory Visit: Payer: Self-pay

## 2019-11-19 VITALS — BP 110/70 | HR 93 | Temp 98.0°F | Ht 70.0 in | Wt 156.0 lb

## 2019-11-19 DIAGNOSIS — I2510 Atherosclerotic heart disease of native coronary artery without angina pectoris: Secondary | ICD-10-CM | POA: Diagnosis not present

## 2019-11-19 DIAGNOSIS — I482 Chronic atrial fibrillation, unspecified: Secondary | ICD-10-CM

## 2019-11-19 DIAGNOSIS — R627 Adult failure to thrive: Secondary | ICD-10-CM

## 2019-11-19 DIAGNOSIS — Z952 Presence of prosthetic heart valve: Secondary | ICD-10-CM | POA: Diagnosis not present

## 2019-11-19 DIAGNOSIS — I4821 Permanent atrial fibrillation: Secondary | ICD-10-CM

## 2019-11-19 DIAGNOSIS — I340 Nonrheumatic mitral (valve) insufficiency: Secondary | ICD-10-CM

## 2019-11-19 NOTE — Progress Notes (Signed)
Cardiology Clinic Note   Patient Name: Wayne [name] Date of Encounter: 11/19/2019  Primary Care Provider:  Lorrene Reid, PA-C Primary Cardiologist:  Kirk Ruths, MD  Patient Profile    Wayne [name] 86 year old male presents today for follow-up evaluation of his chest pain, coronary artery disease, TAVR, and severe MR.  Past Medical History    Past Medical History:  Diagnosis Date  . Anxiety    "maybe; having trouble at night" (04/24/2018)  . Aortic stenosis   . Arthritis   . Atrial fibrillation, chronic ([place])   . BPH (benign prostatic hyperplasia)   . CAD (coronary artery disease)   . Chronic anticoagulation   . Heart murmur   . Hepatitis 1960s   "drank contaminated water" (04/23/2018)  . History of chicken pox   . HTN (hypertension)    "have never had high blood pressure" (04/23/2018)  . Mitral stenosis   . Old MI (myocardial infarction) 2007   s/p PCI to distal OM (no stent)  . Pneumonia    "1 time" (04/23/2018)  . Severe mitral regurgitation   . 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 CATARACT EXTRACTION W/ INTRAOCULAR LENS IMPLANT Left   . CORONARY ANGIOPLASTY  2007   Distal OM  . LAPAROSCOPIC APPENDECTOMY N/A 06/22/2018   Procedure: APPENDECTOMY LAPAROSCOPIC;  Surgeon: Georganna Skeans, MD;  Location: [facility];  Service: General;  Laterality: N/A;  . PROSTATE BIOPSY    . RIGHT/LEFT HEART CATH AND CORONARY ANGIOGRAPHY N/A 08/01/2017   Procedure: RIGHT/LEFT HEART CATH AND CORONARY ANGIOGRAPHY;  Surgeon: Sherren Mocha, MD;  Location: [place] [facility];  Service: Cardiovascular;  Laterality: N/A;  . TEE WITHOUT CARDIOVERSION N/A 07/20/2017   Procedure: TRANSESOPHAGEAL ECHOCARDIOGRAM (TEE);  Surgeon: Lelon Perla, MD;  Location: [facility] ENDOSCOPY;  Service: Cardiovascular;  Laterality: N/A;  . TEE WITHOUT CARDIOVERSION N/A 09/04/2017   Procedure: TRANSESOPHAGEAL ECHOCARDIOGRAM (TEE);  Surgeon: Sherren Mocha, MD;  Location: [place];  Service: Open Heart Surgery;  Laterality: N/A;  . TRANSCATHETER AORTIC VALVE REPLACEMENT, TRANSFEMORAL N/A 09/04/2017   Procedure: TRANSCATHETER AORTIC VALVE REPLACEMENT, TRANSFEMORAL;  Surgeon: Sherren Mocha, MD;  Location: [place];  Service: Open Heart Surgery;  Laterality: N/A;    Allergies  Allergies  Allergen Reactions  . Penicillins Rash and Other (See Comments)    PATIENT HAS HAD A PCN REACTION WITH IMMEDIATE RASH, FACIAL/TONGUE/THROAT SWELLING, SOB, OR LIGHTHEADEDNESS WITH HYPOTENSION:  #  #  #  YES  #  #  #   Has patient had a PCN reaction causing severe rash involving mucus membranes or skin necrosis: No Has patient had a PCN reaction that required hospitalization No Has patient had a PCN reaction occurring within the last 10 years: No If all of the above answers are "NO", then may proceed with Cephalosporin use.  . Prednisone Other (See Comments)    Wheezing  . Procaine Hcl Other (See Comments)    Passed out after being given this at dental appointment  . Levofloxacin Rash  . Oxycodone Other (See Comments)    constipation    History of Present Illness    Wayne [name] has a PMH of coronary artery disease and aortic stenosis.  He underwent PCI to his OM in 2007.  He had severe aortic stenosis and had TAVR 3/19.  He underwent cardiac catheterization 2/19 that showed patency of his OM PCI site.  Is also known to have severe MR and was not felt to be a candidate for mitral valve clip.  Was echocardiogram 5/20 showed normal LV function and moderate MR.  He also has a history of CVA, HTN, and chronic atrial fibrillation on Eliquis.  He is previously declined statin therapy.  His last seen by Dr. Stanford Breed 03/2019.  He was seen by Kerin Ransom, PA-C on 10/14/2019.  He was seen via virtual platform.  He stated his activity was fairly limited.  He was able to get around with his walker and  was using a lift chair to get up and down his stairs.  He indicated that at night he and his wife played Scrabble.  He denied increased dyspnea.  He was compliant with his medications.  He contacted EMS on 11/14/2019 due to an episode of nausea and chest pain.  At that time he was unable to communicate for 3 to 5 minutes.  He was dealing with an episode of shingles that have been present for the last couple of weeks.  His EKG at that time was unremarkable.  Patient refused to go to the ED.  He presents the clinic today for follow-up evaluation and states 1 week ago he had an episode of nauseousness and increased respiration after being out for lunch.  He continues to heal from his shingles that were on the left side of his face and back of his head.  He does have some facial and scalp pain from the infection.  His daughter and wife indicate that he has had very poor appetite for the last week but has continued to consume oral fluids.  The daughter indicates that he has continued to have 2-3 episodes of nauseousness and increased respirations over the past week.  He also becomes diaphoretic with these episodes.  Episodes last for several minutes then dissipate with rest.  I will order a 3-day zio monitor, and echocardiogram for further evaluation.  I will also consult palliative care.  Plan follow-up for 1 month.  Today he denies chest pain, shortness of breath, lower extremity edema, palpitations, diaphoresis, weakness, presyncope, syncope, orthopnea, and PND.     Home Medications    Prior to Admission medications   Medication Sig Start Date End Date Taking? Authorizing Provider  acetaminophen (TYLENOL) 500 MG tablet Take 500 mg by mouth every 6 (six) hours as needed for moderate pain or headache.     [provider]  alfuzosin (UROXATRAL) 10 MG 24 hr tablet Take 10 mg by mouth daily with breakfast.    [provider]  ARTIFICIAL TEAR OP Place 1 drop into both eyes daily as needed  (dry eyes).    [provider]  digoxin (LANOXIN) 0.125 MG tablet Take 1 tablet by mouth once daily 08/01/19   Eileen Stanford, PA-C  ELIQUIS 5 MG TABS tablet Take 1 tablet by mouth twice daily 05/23/19   Lelon Perla, MD  finasteride (PROSCAR) 5 MG tablet Take 5 mg by mouth at bedtime.  02/14/17   Lelon Perla, MD  furosemide (LASIX) 20 MG tablet Take 2 tablets (40 mg total) by mouth daily. 11/11/19 05/09/20  Lelon Perla, MD  Melatonin 10 MG TABS Take 10 mg by mouth at bedtime.     [provider]  Menthol, Topical Analgesic, (BLUE-EMU MAXIMUM STRENGTH EX) Apply 1 application topically 3 (three) times daily as needed (muscle pain in back). alterrnates between [employer] and [employer],  Historical, MD  metoprolol tartrate (LOPRESSOR) 25 MG tablet Take 0.5 tablets (12.5 mg total) by mouth 2 (two) times daily. 25 mg in the morning and 12.5 mg in the evening 07/30/19   Lelon Perla, MD  polyethylene glycol (MIRALAX / GLYCOLAX) packet Take 17 g by mouth daily as needed for mild constipation. 06/28/18   Meccariello, Bernita Raisin, DO  potassium chloride (KLOR-CON) 10 MEQ tablet Take 1 tablet (10 mEq total) by mouth daily. 10/23/19   Lendon Colonel, NP  sodium chloride (OCEAN) 0.65 % SOLN nasal spray Place 1 spray into both nostrils daily as needed for congestion.    [provider]  triamcinolone cream (KENALOG) 0.1 % Apply 1 application topically daily as needed (dry skin).     [provider]    Family History    Family History  Problem Relation Age of Onset  . Heart disease Mother   . Heart attack Mother   . Heart disease Father   . Heart attack Father   . Heart attack Brother   . Heart attack Sister   . Stroke Brother    He indicated that his mother is deceased. He indicated that his father is deceased. He indicated that the status of his sister is unknown. He indicated that his maternal grandmother is deceased. He indicated that  his maternal grandfather is deceased. He indicated that his paternal grandmother is deceased. He indicated that his paternal grandfather is deceased.  Social History    Social History   Socioeconomic History  . Marital status: Married    Spouse name: Not on file  . Number of children: 3  . Years of education: 18  . Highest education level: Not on file  Occupational History  . Not on file  Tobacco Use  . Smoking status: Never Smoker  . Smokeless tobacco: Never Used  Substance and Sexual Activity  . Alcohol use: Never  . Drug use: Never  . Sexual activity: Not on file  Other Topics Concern  . Not on file  Social History Narrative   Fun: Garden, work in the yard, anything physical and walk daily   Social Determinants of Radio broadcast assistant Strain:   . Difficulty of Paying Living Expenses:   Food Insecurity:   . Worried About Charity fundraiser in the Last Year:   . Arboriculturist in the Last Year:   Transportation Needs:   . Film/video editor (Medical):   Marland Kitchen Lack of Transportation (Non-Medical):   Physical Activity:   . Days of Exercise per Week:   . Minutes of Exercise per Session:   Stress:   . Feeling of Stress :   Social Connections:   . Frequency of Communication with Friends and Family:   . Frequency of Social Gatherings with Friends and Family:   . Attends Religious Services:   . Active Member of Clubs or Organizations:   . Attends Archivist Meetings:   Marland Kitchen Marital Status:   Intimate Partner Violence:   . Fear of Current or Ex-Partner:   . Emotionally Abused:   Marland Kitchen Physically Abused:   . Sexually Abused:      Review of Systems    General:  No chills, fever, night sweats or weight changes.  Cardiovascular:  No chest pain, dyspnea on exertion, edema, orthopnea, palpitations, paroxysmal nocturnal dyspnea. Dermatological: No rash, masses, healing shingles lesions left side of face and head. Respiratory: No cough, dyspnea Urologic: No  hematuria,  dysuria Abdominal:   No nausea, vomiting, diarrhea, bright red blood per rectum, melena, or hematemesis Neurologic:  No visual changes, wkns, changes in mental status. All other systems reviewed and are otherwise negative except as noted above.  Physical Exam    VS:  BP 110/70 (BP Location: Right Arm, Patient Position: Sitting, Cuff Size: Normal)   Pulse 93   Temp 98 F (36.7 C)   Ht 5\' 10"  (1.778 m)   Wt 156 lb (70.8 kg)   BMI 22.38 kg/m  , BMI Body mass index is 22.38 kg/m. GEN: Well nourished, well developed, in no acute distress. HEENT: normal. Neck: Supple, no JVD, carotid bruits, or masses. Cardiac: Irregularly irregular, no murmurs, rubs, or gallops. No clubbing, cyanosis, edema.  Radials/DP/PT 2+ and equal bilaterally.  Respiratory:  Respirations regular and unlabored, clear to auscultation bilaterally. GI: Soft, nontender, nondistended, BS + x 4. MS: no deformity or atrophy, frail. Skin: warm and dry, no rash.  Healing shingles lesions left side of face and head. Neuro:  Strength and sensation are intact. Psych: Flat affect.  Accessory Clinical Findings    ECG personally reviewed by me today-atrial fibrillation nonspecific ST and T wave abnormality 93 bpm- No acute changes  EKG 06/20/2018 Atrial fibrillation 115 bpm  Echocardiogram 11/05/2018 IMPRESSIONS    1. The left ventricle has normal systolic function with an ejection  fraction of 60-65%. The cavity size was normal. There is moderately  increased left ventricular wall thickness. Left ventricular diastolic  function could not be evaluated secondary to  atrial fibrillation. Elevated mean left atrial pressure.  2. The right ventricle has normal systolic function. The cavity was  mildly enlarged.  3. Left atrial size was severely dilated.  4. Right atrial size was severely dilated.  5. The mitral valve is abnormal. Mild thickening of the mitral valve  leaflet. There is moderate mitral annular  calcification present. Mitral  valve regurgitation is moderate by color flow Doppler.  6. The tricuspid valve is grossly normal.  7. A 26 Edwards Sapien bioprosthetic aortic valve (TAVR) valve is present  in the aortic position. Procedure Date: 09/04/17 Normal aortic valve  prosthesis.  8. Akinesis of the inferolateral wall with overall preserved LV systolic  function; moderate LVH; severe biatrial enlargement; mild RVE; s/p AVR  with mean gradient of 7 mmHg and no AI; probable flail portion of MV  leaflet with at least moderate MR  (difficult to quantitate due to eccentric nature of jet).  Cardiac catheterization 08/01/2017 Calcified coronary arteries with mild nonobstructive CAD 2. Moderate pulmonary HTN with mPAP 37 mmHg 3. Large V waves 41 mmHg consistent with severe mitral regurgitation  Recommend: continue evaluation by the multidisciplinary heart valve team  Assessment & Plan   1.  Chest pain/coronary artery disease -had episode of chest discomfort and nausea 11/14/2019.  EMS was contacted.  EKG was unremarkable at that time.  Patient refused transport to ED and went to sleep. Cardiac catheterization 2/19 that showed patency of his OM PCI site.  Has had continued 2-3 episodes of nausea, increased respiration and fatigue daily. Continue metoprolol, furosemide Heart healthy low-sodium diet Increase physical activity as tolerated    Atrial fibrillation-heart rate today 93 bpm.  EKG shows atrial fibrillation, been having 2-3 episodes of increased respiration, nauseousness, and fatigue daily.  Suspect this may be related to arrhythmia versus anxiety. Continue Eliquis, digoxin, furosemide Order 3-day Zio monitor  Status post TAVR-no increased DOE or activity intolerance.  Echocardiogram 5/20 showed normal valve  function Order echocardiogram  Moderate MR-no increased activity intolerance or DOE.  Not a candidate for mitral valve clipping Continue medical management  Failure to  thrive-has had poor appetite and weight is down to 156 from 170s.  Daughter requested information about palliative care and hospice.  Shared decision making was used and I will contact palliative care. Increase caloric intake Consult palliative care   Disposition: Follow-up with Dr. Stanford Breed  or me in 1 months.   Jossie Ng. Jaion Lagrange NP-C    11/19/2019, 5:06 PM [facility] [address] Office [phone] Fax [phone]

## 2019-11-19 NOTE — Telephone Encounter (Signed)
Enrolled patient for a 3 day Zio monitor to be mailed to patients home.  

## 2019-11-19 NOTE — Patient Instructions (Signed)
Medication Instructions:  The current medical regimen is effective;  continue present plan and medications as directed. Please refer to the Current Medication list given to you today. *If you need a refill on your cardiac medications before your next appointment, please call your pharmacy*  Testing/Procedures: Echocardiogram(URGENT) - Your physician has requested that you have an echocardiogram. Echocardiography is a painless test that uses sound waves to create images of your heart. It provides your doctor with information about the size and shape of your heart and how well your heart's chambers and valves are working. This procedure takes approximately one hour. There are no restrictions for this procedure. This will be performed at our [facility] location - [address], [address].  Your physician has recommended that you wear a 3 DAY ZIO-PATCH monitor. The Zio patch cardiac monitor continuously records heart rhythm data for up to 14 days, this is for patients being evaluated for multiple types heart rhythms. For the first 24 hours post application, please avoid getting the Zio monitor wet in the shower or by excessive sweating during exercise. After that, feel free to carry on with regular activities. Keep soaps and lotions away from the ZIO XT Patch.  This will be mailed to you, please expect 7-10 days to receive.     Special Instructions REFERRAL TO HOSPICE-URGENT  INCREASE CALORIC INTAKE-NO RESTRICTIONS ON DIET  Follow-Up: Your next appointment:  1 week(s)  In Person with Kirk Ruths, MD  At [facility], you and your health needs are our priority.  As part of our continuing mission to provide you with exceptional heart care, we have created designated Provider Care Teams.  These Care Teams include your primary Cardiologist (physician) and Advanced Practice Providers (APPs -  Physician Assistants and Nurse Practitioners) who all work together to provide you with the care you need,  when you need it.

## 2019-11-20 ENCOUNTER — Telehealth: Payer: Self-pay | Admitting: Cardiology

## 2019-11-20 NOTE — Telephone Encounter (Signed)
Daughter of the patient got a call from our office but the person leaving the message did not leave a name. Attempted to determine who called the daughter

## 2019-11-21 ENCOUNTER — Other Ambulatory Visit (INDEPENDENT_AMBULATORY_CARE_PROVIDER_SITE_OTHER): Payer: Medicare Other

## 2019-11-21 DIAGNOSIS — I340 Nonrheumatic mitral (valve) insufficiency: Secondary | ICD-10-CM | POA: Diagnosis not present

## 2019-11-21 DIAGNOSIS — I4821 Permanent atrial fibrillation: Secondary | ICD-10-CM | POA: Diagnosis not present

## 2019-11-21 DIAGNOSIS — Z952 Presence of prosthetic heart valve: Secondary | ICD-10-CM

## 2019-11-22 ENCOUNTER — Emergency Department (HOSPITAL_COMMUNITY): Payer: Medicare Other

## 2019-11-22 ENCOUNTER — Inpatient Hospital Stay (HOSPITAL_COMMUNITY)
Admission: EM | Admit: 2019-11-22 | Discharge: 2019-11-28 | DRG: 071 | Disposition: A | Discharge status: Hospice | Payer: Medicare Other | Attending: Internal Medicine | Admitting: Internal Medicine

## 2019-11-22 ENCOUNTER — Other Ambulatory Visit: Payer: Self-pay

## 2019-11-22 DIAGNOSIS — Z515 Encounter for palliative care: Secondary | ICD-10-CM

## 2019-11-22 DIAGNOSIS — R0602 Shortness of breath: Secondary | ICD-10-CM | POA: Diagnosis not present

## 2019-11-22 DIAGNOSIS — F411 Generalized anxiety disorder: Secondary | ICD-10-CM

## 2019-11-22 DIAGNOSIS — Z7901 Long term (current) use of anticoagulants: Secondary | ICD-10-CM

## 2019-11-22 DIAGNOSIS — R64 Cachexia: Secondary | ICD-10-CM | POA: Diagnosis present

## 2019-11-22 DIAGNOSIS — R069 Unspecified abnormalities of breathing: Secondary | ICD-10-CM | POA: Diagnosis not present

## 2019-11-22 DIAGNOSIS — N138 Other obstructive and reflux uropathy: Secondary | ICD-10-CM | POA: Diagnosis present

## 2019-11-22 DIAGNOSIS — R52 Pain, unspecified: Secondary | ICD-10-CM | POA: Diagnosis not present

## 2019-11-22 DIAGNOSIS — Z955 Presence of coronary angioplasty implant and graft: Secondary | ICD-10-CM

## 2019-11-22 DIAGNOSIS — R4182 Altered mental status, unspecified: Secondary | ICD-10-CM | POA: Diagnosis present

## 2019-11-22 DIAGNOSIS — Z952 Presence of prosthetic heart valve: Secondary | ICD-10-CM

## 2019-11-22 DIAGNOSIS — I5032 Chronic diastolic (congestive) heart failure: Secondary | ICD-10-CM | POA: Diagnosis present

## 2019-11-22 DIAGNOSIS — Z20822 Contact with and (suspected) exposure to covid-19: Secondary | ICD-10-CM | POA: Diagnosis present

## 2019-11-22 DIAGNOSIS — I482 Chronic atrial fibrillation, unspecified: Secondary | ICD-10-CM | POA: Diagnosis present

## 2019-11-22 DIAGNOSIS — I340 Nonrheumatic mitral (valve) insufficiency: Secondary | ICD-10-CM | POA: Diagnosis present

## 2019-11-22 DIAGNOSIS — R0689 Other abnormalities of breathing: Secondary | ICD-10-CM | POA: Diagnosis not present

## 2019-11-22 DIAGNOSIS — Z8249 Family history of ischemic heart disease and other diseases of the circulatory system: Secondary | ICD-10-CM

## 2019-11-22 DIAGNOSIS — G9341 Metabolic encephalopathy: Secondary | ICD-10-CM | POA: Diagnosis not present

## 2019-11-22 DIAGNOSIS — D696 Thrombocytopenia, unspecified: Secondary | ICD-10-CM | POA: Diagnosis present

## 2019-11-22 DIAGNOSIS — R0902 Hypoxemia: Secondary | ICD-10-CM | POA: Diagnosis not present

## 2019-11-22 DIAGNOSIS — I10 Essential (primary) hypertension: Secondary | ICD-10-CM | POA: Diagnosis present

## 2019-11-22 DIAGNOSIS — I252 Old myocardial infarction: Secondary | ICD-10-CM

## 2019-11-22 DIAGNOSIS — N401 Enlarged prostate with lower urinary tract symptoms: Secondary | ICD-10-CM | POA: Diagnosis present

## 2019-11-22 DIAGNOSIS — Z823 Family history of stroke: Secondary | ICD-10-CM

## 2019-11-22 DIAGNOSIS — Z8673 Personal history of transient ischemic attack (TIA), and cerebral infarction without residual deficits: Secondary | ICD-10-CM

## 2019-11-22 DIAGNOSIS — Z66 Do not resuscitate: Secondary | ICD-10-CM

## 2019-11-22 DIAGNOSIS — E876 Hypokalemia: Secondary | ICD-10-CM | POA: Diagnosis present

## 2019-11-22 DIAGNOSIS — Z7189 Other specified counseling: Secondary | ICD-10-CM

## 2019-11-22 DIAGNOSIS — I2510 Atherosclerotic heart disease of native coronary artery without angina pectoris: Secondary | ICD-10-CM | POA: Diagnosis present

## 2019-11-22 DIAGNOSIS — I110 Hypertensive heart disease with heart failure: Secondary | ICD-10-CM | POA: Diagnosis present

## 2019-11-22 LAB — TROPONIN I (HIGH SENSITIVITY)
Troponin I (High Sensitivity): 33 ng/L — ABNORMAL HIGH (ref <18)
Troponin I (High Sensitivity): 27 ng/L — ABNORMAL HIGH (ref <18)

## 2019-11-22 LAB — COMPREHENSIVE METABOLIC PANEL
ALT: 16 U/L (ref 0–44)
AST: 23 U/L (ref 15–41)
Albumin: 3.5 g/dL (ref 3.5–5.0)
Alkaline Phosphatase: 32 U/L — ABNORMAL LOW (ref 38–126)
Anion gap: 12 (ref 5–15)
BUN: 17 mg/dL (ref 8–23)
CO2: 22 mmol/L (ref 22–32)
Calcium: 8.4 mg/dL — ABNORMAL LOW (ref 8.9–10.3)
Chloride: 105 mmol/L (ref 98–111)
Creatinine, Ser: 1.1 mg/dL (ref 0.61–1.24)
GFR calc Af Amer: >60 mL/min (ref >60)
GFR calc non Af Amer: 60 mL/min — ABNORMAL LOW (ref >60)
Glucose, Bld: 125 mg/dL — ABNORMAL HIGH (ref 70–99)
Potassium: 3.1 mmol/L — ABNORMAL LOW (ref 3.5–5.1)
Sodium: 139 mmol/L (ref 135–145)
Total Bilirubin: 1.1 mg/dL (ref 0.3–1.2)
Total Protein: 6.4 g/dL — ABNORMAL LOW (ref 6.5–8.1)

## 2019-11-22 LAB — CBC WITH DIFFERENTIAL/PLATELET
Abs Immature Granulocytes: 0.04 10*3/uL (ref 0.00–0.07)
Basophils Absolute: 0.1 10*3/uL (ref 0.0–0.1)
Basophils Relative: 1 %
Eosinophils Absolute: 0.2 10*3/uL (ref 0.0–0.5)
Eosinophils Relative: 2 %
HCT: 43.5 % (ref 39.0–52.0)
Hemoglobin: 14.2 g/dL (ref 13.0–17.0)
Immature Granulocytes: 1 %
Lymphocytes Relative: 11 %
Lymphs Abs: 0.9 10*3/uL (ref 0.7–4.0)
MCH: 30.4 pg (ref 26.0–34.0)
MCHC: 32.6 g/dL (ref 30.0–36.0)
MCV: 93.1 fL (ref 80.0–100.0)
Monocytes Absolute: 0.7 10*3/uL (ref 0.1–1.0)
Monocytes Relative: 8 %
Neutro Abs: 6.6 10*3/uL (ref 1.7–7.7)
Neutrophils Relative %: 77 %
Platelets: 97 10*3/uL — ABNORMAL LOW (ref 150–400)
RBC: 4.67 MIL/uL (ref 4.22–5.81)
RDW: 17.2 % — ABNORMAL HIGH (ref 11.5–15.5)
WBC: 8.5 10*3/uL (ref 4.0–10.5)
nRBC: 0 % (ref 0.0–0.2)

## 2019-11-22 LAB — SARS CORONAVIRUS 2 BY RT PCR (HOSPITAL ORDER, PERFORMED IN ~~LOC~~ HOSPITAL LAB): SARS Coronavirus 2: NEGATIVE

## 2019-11-22 MED ORDER — POTASSIUM CHLORIDE CRYS ER 20 MEQ PO TBCR: 40.0000 meq | EXTENDED_RELEASE_TABLET | Freq: Once | ORAL | Status: DC

## 2019-11-22 MED ORDER — SODIUM CHLORIDE 0.9 % IV BOLUS
500.0000 mL | Freq: Once | INTRAVENOUS | Status: AC
  Administered 2019-11-23: 500 mL via INTRAVENOUS

## 2019-11-22 NOTE — ED Notes (Signed)
Pt transported to CT ?

## 2019-11-22 NOTE — ED Provider Notes (Signed)
[facility] EMERGENCY DEPARTMENT Provider Note   CSN: [id number] Arrival date & time: 11/22/19  1733     History Chief Complaint  Patient presents with  . Altered Mental Status  . Fall    Wayne [name] is a 86 y.o. male.  HPI Patient brought to the ED by EMS, daughter and wife at bedside give most of the history. Patient had been doing well until about 3 weeks ago when he was diagnosed with shingles on his L scalp/forehead. He has completed a course of Antivirals but has had a gradual decline in his general functioning at home. He had a fall while going to the restroom last night, unsure if he hit his head, but today he was unable to get up out of bed at all. He has had poor appetite but has been drinking water. He has had several episodes where he clutches his chest and asks for help but has refused transport to the ED. He was seen at his cardiologists office where he was noted to have had significant weight loss. He had a ZIO patch monitor applied then, still wearing it now. He is on Eliquis for Afib. He denies any current complaints, although family notes he tends to minimize his symptoms.     Past Medical History:  Diagnosis Date  . Anxiety    "maybe; having trouble at night" (04/24/2018)  . Aortic stenosis   . Arthritis   . Atrial fibrillation, chronic ([place])   . BPH (benign prostatic hyperplasia)   . CAD (coronary artery disease)   . Chronic anticoagulation   . Heart murmur   . Hepatitis 1960s   "drank contaminated water" (04/23/2018)  . History of chicken pox   . HTN (hypertension)    "have never had high blood pressure" (04/23/2018)  . Mitral stenosis   . Old MI (myocardial infarction) 2007   s/p PCI to distal OM (no stent)  . Pneumonia    "1 time" (04/23/2018)  . Severe mitral regurgitation   . 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 Daytime somnolence 07/17/2018  . Appendicitis 07/17/2018  . Chronic anemia 07/17/2018  . Oxygen desaturation   . Abnormality of gait   . Impaired mobility and ADLs   . Acute on chronic diastolic heart failure ([place])   . Sepsis ([place]) 06/20/2018  . SIRS (systemic inflammatory response syndrome) ([place]) 06/20/2018  . Dyspnea   . Normocytic anemia   . Appendicitis with perforation 04/21/2018  . Pain in left knee 02/26/2018  . Pain in right knee 02/26/2018  . Fall 01/21/2018  . Closed fracture of left inferior pubic ramus ([place]) 01/21/2018  . Constipation 12/19/2017  .  hernia of linea alba 12/19/2017  . Osteoarthritis of hip 09/24/2017  . Gastroenteritis 09/10/2017  . S/P TAVR (transcatheter aortic valve replacement) 09/04/2017  . Severe mitral regurgitation   . Insomnia 07/10/2017  . High risk medication use 07/10/2017  . Long term current use of anticoagulant 07/10/2017  . Chronic diastolic CHF (congestive heart failure) ([place]) 05/20/2017  . Hyponatremia 05/20/2017  . Severe aortic stenosis 05/20/2017  . Urinary retention   . 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 Spinal stenosis at L4-L5 level 04/26/2017  . Lumbar radiculopathy, chronic 04/26/2017  . Benign colonic polyp- 25 yrs ago.  11/16/2016  . Vitamin D deficiency  11/16/2016  . H/O non anemic vitamin B12 deficiency 11/16/2016  . Chronic Colonic dysmotility 11/16/2016  . Hypokalemia 09/13/2016  . BPH with obstruction/lower urinary tract symptoms 09/13/2016  . Urinary frequency 07/10/2016  . Left bundle branch block (LBBB) on electrocardiogram 06/30/2016  . History of stroke 06/30/2016  . Generalized weakness 06/30/2016  . Asthenia 06/30/2016  . Osteoarthritis 04/04/2016  . Encounter for therapeutic drug monitoring 08/06/2013  . Chronic anticoagulation   . HTN (hypertension)   . CAD (coronary artery disease)   . Chronic atrial fibrillation ([place]) 09/13/2010  . h/o CVA  (cerebrovascular accident due to intracerebral hemorrhage) 09/13/2010    Past Surgical History:  Procedure Laterality Date  . CATARACT EXTRACTION W/ INTRAOCULAR LENS IMPLANT Left   . CORONARY ANGIOPLASTY  2007   Distal OM  . LAPAROSCOPIC APPENDECTOMY N/A 06/22/2018   Procedure: APPENDECTOMY LAPAROSCOPIC;  Surgeon: Georganna Skeans, MD;  Location: [facility];  Service: General;  Laterality: N/A;  . PROSTATE BIOPSY    . RIGHT/LEFT HEART CATH AND CORONARY ANGIOGRAPHY N/A 08/01/2017   Procedure: RIGHT/LEFT HEART CATH AND CORONARY ANGIOGRAPHY;  Surgeon: Sherren Mocha, MD;  Location: [facility];  Service: Cardiovascular;  Laterality: N/A;  . TEE WITHOUT CARDIOVERSION N/A 07/20/2017   Procedure: TRANSESOPHAGEAL ECHOCARDIOGRAM (TEE);  Surgeon: Lelon Perla, MD;  Location: [facility] ENDOSCOPY;  Service: Cardiovascular;  Laterality: N/A;  . TEE WITHOUT CARDIOVERSION N/A 09/04/2017   Procedure: TRANSESOPHAGEAL ECHOCARDIOGRAM (TEE);  Surgeon: Sherren Mocha, MD;  Location: [facility];  Service: Open Heart Surgery;  Laterality: N/A;  . TRANSCATHETER AORTIC VALVE REPLACEMENT, TRANSFEMORAL N/A 09/04/2017   Procedure: TRANSCATHETER AORTIC VALVE REPLACEMENT, TRANSFEMORAL;  Surgeon: Sherren Mocha, MD;  Location: [facility];  Service: Open Heart Surgery;  Laterality: N/A;       Family History  Problem Relation Age of Onset  . Heart disease Mother   . Heart attack Mother   . Heart disease Father   . Heart attack Father   . Heart attack Brother   . Heart attack Sister   . Stroke Brother     Social History   Tobacco Use  . Smoking status: Never Smoker  . Smokeless tobacco: Never Used  Substance Use Topics  . Alcohol use: Never  . Drug use: Never    Home Medications Prior to Admission medications   Medication Sig Start Date End Date Taking? Authorizing Provider  acetaminophen (TYLENOL) 500 MG tablet Take 1,000 mg by mouth every 6 (six) hours as needed for moderate pain or headache.    Yes [provider]  alfuzosin (UROXATRAL) 10 MG 24 hr tablet Take 10 mg by mouth daily with breakfast.   Yes [provider]  ARTIFICIAL TEAR OP Place 1 drop into both eyes daily as needed (dry eyes).   Yes [provider]  digoxin (LANOXIN) 0.125 MG tablet Take 1 tablet by mouth once daily Patient taking differently: Take 0.0625 mg by mouth daily.  08/01/19  Yes Angelena Form R, PA-C  ELIQUIS 5 MG TABS tablet Take 1 tablet by mouth twice daily Patient taking differently: Take 5 mg by mouth 2 (two) times daily.  05/23/19  Yes Lelon Perla, MD  finasteride (PROSCAR) 5 MG tablet Take 5 mg by mouth at bedtime.  02/14/17  Yes Lelon Perla, MD  furosemide (LASIX) 20 MG tablet Take 2 tablets (40 mg total) by mouth daily. 11/11/19 05/09/20 Yes Lelon Perla, MD  metoprolol tartrate (LOPRESSOR) 25 MG tablet Take 0.5 tablets (12.5 mg total)  by mouth 2 (two) times daily. 25 mg in the morning and 12.5 mg in the evening Patient taking differently: Take 12.5-25 mg by mouth See admin instructions. Take one tablet (25 mg) by mouth every morning and 1/2 tablet (12.5 mg) in the evening 07/30/19  Yes Crenshaw, Denice Bors, MD  polyethylene glycol (MIRALAX / GLYCOLAX) packet Take 17 g by mouth daily as needed for mild constipation. 06/28/18  Yes Meccariello, Bernita Raisin, DO  potassium chloride (KLOR-CON) 10 MEQ tablet Take 1 tablet (10 mEq total) by mouth daily. 10/23/19  Yes Lendon Colonel, NP  triamcinolone cream (KENALOG) 0.1 % Apply 1 application topically daily as needed (itching).    Yes [provider]    Allergies    Penicillins, Prednisone, Procaine hcl, Levofloxacin, and Oxycodone  Review of Systems   Review of Systems A comprehensive review of systems was completed and negative except as noted in HPI.   Physical Exam Updated Vital Signs BP 106/87   Pulse 95   Temp 98.9 F (37.2 C)   Resp 20   SpO2 96%   Physical Exam Vitals and nursing note reviewed.    Constitutional:      Appearance: Normal appearance.  HENT:     Head: Normocephalic and atraumatic.     Comments: Crusting rash to L forehead/scalp    Nose: Nose normal.     Mouth/Throat:     Mouth: Mucous membranes are moist.  Eyes:     General:        Left eye: Discharge present.    Extraocular Movements: Extraocular movements intact.     Comments: Bilateral conjunctival injection  Cardiovascular:     Rate and Rhythm: Normal rate and regular rhythm.     Heart sounds: Murmur present.  Pulmonary:     Effort: Pulmonary effort is normal.     Breath sounds: Normal breath sounds.  Abdominal:     General: Abdomen is flat.     Palpations: Abdomen is soft.     Tenderness: There is no abdominal tenderness.  Musculoskeletal:        General: No swelling. Normal range of motion.     Cervical back: Neck supple.  Skin:    General: Skin is warm and dry.  Neurological:     General: No focal deficit present.     Mental Status: He is alert.     Cranial Nerves: No cranial nerve deficit.     Sensory: No sensory deficit.     Motor: No weakness.  Psychiatric:        Mood and Affect: Mood normal.     ED Results / Procedures / Treatments   Labs (all labs ordered are listed, but only abnormal results are displayed) Labs Reviewed  COMPREHENSIVE METABOLIC PANEL - Abnormal; Notable for the following components:      Result Value   Potassium 3.1 (*)    Glucose, Bld 125 (*)    Calcium 8.4 (*)    Total Protein 6.4 (*)    Alkaline Phosphatase 32 (*)    GFR calc non Af Amer 60 (*)    All other components within normal limits  CBC WITH DIFFERENTIAL/PLATELET - Abnormal; Notable for the following components:   RDW 17.2 (*)    Platelets 97 (*)    All other components within normal limits  TROPONIN I (HIGH SENSITIVITY) - Abnormal; Notable for the following components:   Troponin I (High Sensitivity) 33 (*)    All other components within normal limits  SARS CORONAVIRUS 2 BY RT PCR (HOSPITAL  ORDER, [facility])  URINALYSIS, ROUTINE W REFLEX MICROSCOPIC  TROPONIN I (HIGH SENSITIVITY)    EKG EKG Interpretation  Date/Time:  Saturday November 22 2019 18:04:27 EDT Ventricular Rate:  94 PR Interval:    QRS Duration: 94 QT Interval:  349 QTC Calculation: 437 R Axis:   45 Text Interpretation: Atrial fibrillation Nonspecific T abnormalities, lateral leads No significant change since last tracing Confirmed by Calvert Cantor [phone]) on 11/22/2019 6:16:45 PM   Radiology CT Head Wo Contrast  Result Date: 11/22/2019 CLINICAL DATA:  Fall yesterday. On anticoagulation. Weakness for 3 weeks. EXAM: CT HEAD WITHOUT CONTRAST TECHNIQUE: Contiguous axial images were obtained from the base of the skull through the vertex without intravenous contrast. COMPARISON:  Head CT 06/20/2018 FINDINGS: Brain: Mild motion artifact limits assessment. No intracranial hemorrhage, mass effect, or midline shift. Generalized atrophy, stable from prior. No hydrocephalus. The basilar cisterns are patent. Mild chronic small vessel ischemia. Remote right cerebellar infarct. No evidence of territorial infarct or acute ischemia. No extra-axial or intracranial fluid collection. Vascular: Atherosclerosis of skullbase vasculature without hyperdense vessel or abnormal calcification. Skull: No fracture or focal lesion. Sinuses/Orbits: No acute finding. Other: None. IMPRESSION: 1. No acute intracranial abnormality. No skull fracture. 2. Stable atrophy and chronic small vessel ischemia. Remote right cerebellar infarct. Electronically Signed   By: Keith Rake M.D.   On: 11/22/2019 19:32   DG Chest Port 1 View  Result Date: 11/22/2019 CLINICAL DATA:  Status post fall. EXAM: PORTABLE CHEST 1 VIEW COMPARISON:  June 24, 2018 FINDINGS: There is no evidence of acute infiltrate, pleural effusion or pneumothorax. The heart size and mediastinal contours are within normal limits. An artificial cardiac valve is seen.  There is marked severity calcification of the thoracic aorta. Degenerative changes seen involving the mid and lower thoracic spine. IMPRESSION: No active disease. Electronically Signed   By: Virgina Norfolk M.D.   On: 11/22/2019 23:16    Procedures Procedures (including critical care time)  Medications Ordered in ED Medications  sodium chloride 0.9 % bolus 500 mL (has no administration in time range)  potassium chloride SA (KLOR-CON) CR tablet 40 mEq (has no administration in time range)    ED Course  I have reviewed the triage vital signs and the nursing notes.  Pertinent labs & imaging results that were available during my care of the patient were reviewed by me and considered in my medical decision making (see chart for details).  Clinical Course as of Nov 21 2340  Sat Nov 22, 2019  2158 CBC is normal. CMP unremarkable, aside from mild hypokalemia. Trop is mildly elevated. Will check Covid in anticipation of admission.    [CS]  2159 Patient has not yet given a urine specimen.    [CS]  2244 Hospitalist paged for admission.    [CS]  2315 Spoke with Dr. Flossie Buffy, hospitalists who will evaluate the patient for admission.    [CS]    Clinical Course User Index [CS] Truddie Hidden, MD   MDM Rules/Calculators/A&P                      Patient with increased in generalized weakness after recent diagnosis of shingles. Also had a fall last night as well as recent episodes of chest pain. Will check labs including Trop. EKG does not show any ischemic changes. Plan CT head as well.  Final Clinical Impression(s) / ED Diagnoses Final diagnoses:  Pain    Rx / DC Orders ED Discharge Orders    None       Truddie Hidden, MD 11/22/19 2343

## 2019-11-22 NOTE — ED Triage Notes (Addendum)
To triage via EMS from home.  Onset yesterday fell, takes Eliquis, and is now altered per family.  Onset 3 weeks of weakness and shingles and is now non-ambulatory.   EMS BP 148/62 HR 110 RR 26 SpO2 97%  IVF 500 ml

## 2019-11-23 ENCOUNTER — Encounter (HOSPITAL_COMMUNITY): Payer: Self-pay | Admitting: Family Medicine

## 2019-11-23 ENCOUNTER — Telehealth: Payer: Self-pay | Admitting: Physician Assistant

## 2019-11-23 DIAGNOSIS — N401 Enlarged prostate with lower urinary tract symptoms: Secondary | ICD-10-CM

## 2019-11-23 DIAGNOSIS — R52 Pain, unspecified: Secondary | ICD-10-CM | POA: Diagnosis present

## 2019-11-23 DIAGNOSIS — I482 Chronic atrial fibrillation, unspecified: Secondary | ICD-10-CM | POA: Diagnosis not present

## 2019-11-23 DIAGNOSIS — Z20822 Contact with and (suspected) exposure to covid-19: Secondary | ICD-10-CM | POA: Diagnosis present

## 2019-11-23 DIAGNOSIS — Z952 Presence of prosthetic heart valve: Secondary | ICD-10-CM

## 2019-11-23 DIAGNOSIS — N138 Other obstructive and reflux uropathy: Secondary | ICD-10-CM | POA: Diagnosis present

## 2019-11-23 DIAGNOSIS — Z823 Family history of stroke: Secondary | ICD-10-CM | POA: Diagnosis not present

## 2019-11-23 DIAGNOSIS — Z955 Presence of coronary angioplasty implant and graft: Secondary | ICD-10-CM | POA: Diagnosis not present

## 2019-11-23 DIAGNOSIS — Z8249 Family history of ischemic heart disease and other diseases of the circulatory system: Secondary | ICD-10-CM | POA: Diagnosis not present

## 2019-11-23 DIAGNOSIS — I110 Hypertensive heart disease with heart failure: Secondary | ICD-10-CM | POA: Diagnosis present

## 2019-11-23 DIAGNOSIS — Z7189 Other specified counseling: Secondary | ICD-10-CM

## 2019-11-23 DIAGNOSIS — R4182 Altered mental status, unspecified: Secondary | ICD-10-CM

## 2019-11-23 DIAGNOSIS — I252 Old myocardial infarction: Secondary | ICD-10-CM | POA: Diagnosis not present

## 2019-11-23 DIAGNOSIS — I340 Nonrheumatic mitral (valve) insufficiency: Secondary | ICD-10-CM

## 2019-11-23 DIAGNOSIS — Z66 Do not resuscitate: Secondary | ICD-10-CM | POA: Diagnosis not present

## 2019-11-23 DIAGNOSIS — I2510 Atherosclerotic heart disease of native coronary artery without angina pectoris: Secondary | ICD-10-CM | POA: Diagnosis present

## 2019-11-23 DIAGNOSIS — Z8673 Personal history of transient ischemic attack (TIA), and cerebral infarction without residual deficits: Secondary | ICD-10-CM | POA: Diagnosis not present

## 2019-11-23 DIAGNOSIS — E876 Hypokalemia: Secondary | ICD-10-CM | POA: Diagnosis present

## 2019-11-23 DIAGNOSIS — Z515 Encounter for palliative care: Secondary | ICD-10-CM | POA: Diagnosis not present

## 2019-11-23 DIAGNOSIS — F411 Generalized anxiety disorder: Secondary | ICD-10-CM | POA: Diagnosis not present

## 2019-11-23 DIAGNOSIS — D696 Thrombocytopenia, unspecified: Secondary | ICD-10-CM | POA: Diagnosis present

## 2019-11-23 DIAGNOSIS — I5032 Chronic diastolic (congestive) heart failure: Secondary | ICD-10-CM | POA: Diagnosis present

## 2019-11-23 DIAGNOSIS — I10 Essential (primary) hypertension: Secondary | ICD-10-CM | POA: Diagnosis not present

## 2019-11-23 DIAGNOSIS — G9341 Metabolic encephalopathy: Secondary | ICD-10-CM | POA: Diagnosis not present

## 2019-11-23 DIAGNOSIS — Z7901 Long term (current) use of anticoagulants: Secondary | ICD-10-CM | POA: Diagnosis not present

## 2019-11-23 DIAGNOSIS — R64 Cachexia: Secondary | ICD-10-CM | POA: Diagnosis present

## 2019-11-23 LAB — BASIC METABOLIC PANEL
Anion gap: 14 (ref 5–15)
BUN: 16 mg/dL (ref 8–23)
CO2: 24 mmol/L (ref 22–32)
Calcium: 8.7 mg/dL — ABNORMAL LOW (ref 8.9–10.3)
Chloride: 103 mmol/L (ref 98–111)
Creatinine, Ser: 1.12 mg/dL (ref 0.61–1.24)
GFR calc Af Amer: >60 mL/min (ref >60)
GFR calc non Af Amer: 58 mL/min — ABNORMAL LOW (ref >60)
Glucose, Bld: 113 mg/dL — ABNORMAL HIGH (ref 70–99)
Potassium: 3 mmol/L — ABNORMAL LOW (ref 3.5–5.1)
Sodium: 141 mmol/L (ref 135–145)

## 2019-11-23 LAB — CBC
HCT: 44 % (ref 39.0–52.0)
Hemoglobin: 14.6 g/dL (ref 13.0–17.0)
MCH: 30.5 pg (ref 26.0–34.0)
MCHC: 33.2 g/dL (ref 30.0–36.0)
MCV: 91.9 fL (ref 80.0–100.0)
Platelets: 91 10*3/uL — ABNORMAL LOW (ref 150–400)
RBC: 4.79 MIL/uL (ref 4.22–5.81)
RDW: 16.9 % — ABNORMAL HIGH (ref 11.5–15.5)
WBC: 8.7 10*3/uL (ref 4.0–10.5)
nRBC: 0 % (ref 0.0–0.2)

## 2019-11-23 LAB — MAGNESIUM: Magnesium: 2.2 mg/dL (ref 1.7–2.4)

## 2019-11-23 LAB — PROCALCITONIN: Procalcitonin: <0.1 ng/mL

## 2019-11-23 LAB — DIGOXIN LEVEL: Digoxin Level: <0.2 ng/mL — ABNORMAL LOW (ref 0.8–2.0)

## 2019-11-23 MED ORDER — METOPROLOL TARTRATE 12.5 MG HALF TABLET: 12.5000 mg | ORAL_TABLET | Freq: Every day | ORAL | Status: DC

## 2019-11-23 MED ORDER — ACETAMINOPHEN 650 MG RE SUPP: 650.0000 mg | Freq: Four times a day (QID) | RECTAL | Status: DC | PRN

## 2019-11-23 MED ORDER — GLYCOPYRROLATE 0.2 MG/ML IJ SOLN: 0.4000 mg | Freq: Three times a day (TID) | INTRAMUSCULAR | Status: DC | PRN

## 2019-11-23 MED ORDER — GLYCOPYRROLATE 0.2 MG/ML IJ SOLN: 0.4000 mg | Freq: Three times a day (TID) | INTRAMUSCULAR | Status: DC

## 2019-11-23 MED ORDER — MORPHINE SULFATE 10 MG/5ML PO SOLN: 2.5000 mg | ORAL | Status: DC | PRN

## 2019-11-23 MED ORDER — ALFUZOSIN HCL ER 10 MG PO TB24
10.0000 mg | ORAL_TABLET | Freq: Every day | ORAL | Status: DC
  Filled 2019-11-23 (×2): qty 1

## 2019-11-23 MED ORDER — POTASSIUM CHLORIDE 10 MEQ/100ML IV SOLN
10.0000 meq | INTRAVENOUS | Status: AC
  Filled 2019-11-23: qty 100

## 2019-11-23 MED ORDER — METOPROLOL TARTRATE 25 MG PO TABS
25.0000 mg | ORAL_TABLET | Freq: Every day | ORAL | Status: DC
  Filled 2019-11-23: qty 1

## 2019-11-23 MED ORDER — POLYVINYL ALCOHOL 1.4 % OP SOLN
1.0000 [drp] | Freq: Four times a day (QID) | OPHTHALMIC | Status: DC | PRN
  Filled 2019-11-23: qty 15

## 2019-11-23 MED ORDER — BIOTENE DRY MOUTH MT LIQD: 15.0000 mL | OROMUCOSAL | Status: DC | PRN

## 2019-11-23 MED ORDER — ONDANSETRON 4 MG PO TBDP: 4.0000 mg | ORAL_TABLET | Freq: Four times a day (QID) | ORAL | Status: DC | PRN

## 2019-11-23 MED ORDER — POTASSIUM CHLORIDE CRYS ER 10 MEQ PO TBCR
10.0000 meq | EXTENDED_RELEASE_TABLET | Freq: Every day | ORAL | Status: DC
  Filled 2019-11-23: qty 1

## 2019-11-23 MED ORDER — POTASSIUM CHLORIDE CRYS ER 20 MEQ PO TBCR
40.0000 meq | EXTENDED_RELEASE_TABLET | Freq: Once | ORAL | Status: DC
  Filled 2019-11-23: qty 2

## 2019-11-23 MED ORDER — METOPROLOL TARTRATE 25 MG PO TABS: 12.5000 mg | ORAL_TABLET | ORAL | Status: DC

## 2019-11-23 MED ORDER — POTASSIUM CHLORIDE 2 MEQ/ML IV SOLN
INTRAVENOUS | Status: DC
  Filled 2019-11-23: qty 1000

## 2019-11-23 MED ORDER — ONDANSETRON HCL 4 MG/2ML IJ SOLN: 4.0000 mg | Freq: Four times a day (QID) | INTRAMUSCULAR | Status: DC | PRN

## 2019-11-23 MED ORDER — FINASTERIDE 5 MG PO TABS
5.0000 mg | ORAL_TABLET | Freq: Every day | ORAL | Status: DC
  Filled 2019-11-23: qty 1

## 2019-11-23 MED ORDER — SODIUM CHLORIDE 0.9 % IV SOLN: INTRAVENOUS | Status: DC

## 2019-11-23 MED ORDER — ACETAMINOPHEN 325 MG PO TABS
650.0000 mg | ORAL_TABLET | Freq: Four times a day (QID) | ORAL | Status: DC | PRN
  Administered 2019-11-24: 650 mg via ORAL
  Filled 2019-11-23: qty 2

## 2019-11-23 MED ORDER — DEXTROSE 5 % IV SOLN
10.0000 mg/kg | Freq: Two times a day (BID) | INTRAVENOUS | Status: DC
  Administered 2019-11-23 (×2): 710 mg via INTRAVENOUS
  Filled 2019-11-23 (×5): qty 14.2

## 2019-11-23 MED ORDER — BISACODYL 10 MG RE SUPP: 10.0000 mg | Freq: Every day | RECTAL | Status: DC | PRN

## 2019-11-23 MED ORDER — POTASSIUM CHLORIDE 10 MEQ/100ML IV SOLN
10.0000 meq | INTRAVENOUS | Status: AC
  Administered 2019-11-23 (×2): 10 meq via INTRAVENOUS
  Filled 2019-11-23: qty 100

## 2019-11-23 MED ORDER — POTASSIUM CHLORIDE 10 MEQ/100ML IV SOLN: 10.0000 meq | INTRAVENOUS | Status: DC

## 2019-11-23 MED ORDER — MORPHINE SULFATE (PF) 2 MG/ML IV SOLN: 1.0000 mg | INTRAVENOUS | Status: DC | PRN

## 2019-11-23 MED ORDER — FUROSEMIDE 40 MG PO TABS
40.0000 mg | ORAL_TABLET | Freq: Every day | ORAL | Status: DC
  Filled 2019-11-23: qty 1

## 2019-11-23 MED ORDER — DIGOXIN 125 MCG PO TABS
0.0625 mg | ORAL_TABLET | Freq: Every day | ORAL | Status: DC
  Filled 2019-11-23: qty 1

## 2019-11-23 MED ORDER — LORAZEPAM 2 MG/ML IJ SOLN
0.5000 mg | INTRAMUSCULAR | Status: DC | PRN
  Administered 2019-11-24: 1 mg via INTRAVENOUS
  Filled 2019-11-23: qty 1

## 2019-11-23 NOTE — Progress Notes (Signed)
PROGRESS NOTE    Kairee Isa  HYW:[id number] DOB: 1929-10-16 DOA: 11/22/2019 PCP: Lorrene Reid, PA-C   Chief Complaint  Patient presents with  . Altered Mental Status  . Fall   Brief Narrative:  Mahkai Fangman is Enzley Kitchens 86 y.o. male with medical history significant for chronic atrial fibrillation on Eliquis, hypertension, aortic stenosis s/p TAVR, chronic diastolic heart failure, CAD s/p stent and BPH who presents with concerns of worsening weakness and confusion.  Wife at bedside provides history as patient has altered mental status.  She reports that about 3 weeks ago he was diagnosed with shingles to his left eye and scalp and was placed on 10 days of antiviral.  Since then he has had issues with feeling nauseous and at one point last week he had Ambreen Tufte fast heart rate as well as an "attack" where he would shake and per wife is due to anxiety.  EMS was called to the house to evaluate him but reportedly he had normal vitals and patient did not want to be transported.  Then yesterday patient suffered Zaide Kardell fall and today was so weak that he could not get up from bed. Wife denies him complaining of any pain including headache, neck pain, or chest pain. Patient noted to have labored breathing in ED but wife says that happens occasionally and is not new. He was alert and oriented to self and his wife but did not know where he was or why he is here. Wife says this is not his baseline. Last Eliquis was this morning.  Of note, patient saw cardiology 3 days ago for symptoms of nausea and chest pain thought to be arrhthymias vs anxiety and was placed on Kailani Brass Zio patch and had Hanni Milford planned echo.   Assessment & Plan:   Principal Problem:   AMS (altered mental status) Active Problems:   Chronic atrial fibrillation (HCC)   HTN (hypertension)   Hypokalemia   BPH with obstruction/lower urinary tract symptoms   S/P TAVR (transcatheter aortic valve replacement)   Mitral regurgitation  Goals of Care:  family had planned on following up with palliative/hospice outpatient.  Discussed possibility of having palliative care consult while they were here.  Wife was agreeable with this.  When discussing plan of care at the bedside with wife, expressed hesitation regarding LP as they wanted to avoid causing discomfort - discussed rationale for LP (r/o VZV encephalitis and assist with treatment plan with antivirals) as well as options including treating empirically - currently eliquis on hold.  I was planning to revisit plan after wife had opportunity to discuss with daughter.  Of note, since I saw him this morning, palliative care has seen the patient and after Raseel Jans discussion have decided to transition to comfort measures.  Will follow with family.  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  Had follow up after completion of therapy with no vision loss.  Daughter notes at this appointment, father required wheelchair (typically walker) and had his head down, she called him "lethargic".  Daughter notes progressive weakness and confusion since shingles infection.   CT head negative.   MRI brain ordered - per palliative, planning to d/c given plan for comfort LP was planned after holding eliquis for 48 hrs, though as above, plan now for comfort UA and blood cx ordered and pending, but  as noted above, currently planning for comfort measures Acyclovir - will be d/c'd per palliative SLP eval  Hypokalemia  Replace and follow  Thrombocytopenia Unclear etiology.  Possibly reactive. Monitor with morning CBC  Chronic atrial fibrillation will hold Eliquis for potential LP  Continue digoxin, metoprolol and Lasix when able to take PO continuous telemetry  Noted note to after hours answering service with note  that daughter is concerned that her father needs cardiology involvement -> we discussed plan for echo this morning, but not cardiology c/s -> will follow up with daughter her concerns, esp in setting of transition to comfort measures with palliative  Aortic stenosis s/p TAVR Last echo on 5/20 shows normal valve function will repeat Echo as this was also ordered by cardiology outpatient   hx of moderate MR follow repeat echo  HTN Continue metoprolol, Lasix when able to tolerate PO  BPH continue finasteride and alfuzosin when able to tolerate PO  DVT prophylaxis: SCD Code Status: DNR Family Communication: daughter over phone this am, wife over phone this am, wife at bedside Disposition:   Status is: Inpatient  Remains inpatient appropriate because:Inpatient level of care appropriate due to severity of illness   Dispo: The patient is from: Home              Anticipated d/c is to: pending              Anticipated d/c date is: > 3 days              Patient currently is not medically stable to d/c.  Consultants:   Palliative care  Procedures:  none  Antimicrobials:  Anti-infectives (From admission, onward)   Start     Dose/Rate Route Frequency Ordered Stop   11/23/19 0200  acyclovir (ZOVIRAX) 710 mg in dextrose 5 % 150 mL IVPB  Status:  Discontinued     10 mg/kg  70.8 kg 164.2 mL/hr over 60 Minutes Intravenous Every 12 hours 11/23/19 0056 11/23/19 1517     Subjective: Drowsy, but alert Denies any pain  Objective: Vitals:   11/23/19 0051 11/23/19 0115 11/23/19 0231 11/23/19 0830  BP: (!) 122/59 (!) 138/50 (!) 122/95 108/63  Pulse: 97 100 (!) 107 96  Resp: (!) 24 (!) 22 20 18   Temp: 98.1 F (36.7 C)  99.4 F (37.4 C) 98.3 F (36.8 C)  TempSrc: Oral  Axillary Oral  SpO2: 94% 97% 95% 95%  Weight:   76.4 kg   Height:   (!) 6" (0.152 m)     Intake/Output Summary (Last 24 hours) at 11/23/2019 1457 Last data filed at 11/23/2019 0610 Gross per 24 hour  Intake 864  ml  Output --  Net 864 ml   Filed Weights   11/23/19 0231  Weight: 76.4 kg    Examination:  General exam: chronically ill appearing Respiratory system: Clear to auscultation. Respiratory effort normal. Cardiovascular system: S1 & S2 heard, RRR. Gastrointestinal system: Abdomen is nondistended, soft and nontender.  Central nervous system: Alert. No focal neurological deficits.  Following commands.   Extremities: no LEE Skin: crusted zoster lesions to L side of face Psychiatry: Judgement and insight appear normal. Mood & affect appropriate.     Data Reviewed: I have personally reviewed following labs and imaging studies  CBC: Recent Labs  Lab 11/22/19 1947 11/23/19 0404  WBC 8.5 8.7  NEUTROABS 6.6  --   HGB 14.2 14.6  HCT 43.5 44.0  MCV 93.1 91.9  PLT 97* 91*  Basic Metabolic Panel: Recent Labs  Lab 11/22/19 1947 11/23/19 0404  NA 139 141  K 3.1* 3.0*  CL 105 103  CO2 22 24  GLUCOSE 125* 113*  BUN 17 16  CREATININE 1.10 1.12  CALCIUM 8.4* 8.7*  MG  --  2.2    GFR: CrCl cannot be calculated (Unknown ideal weight.).  Liver Function Tests: Recent Labs  Lab 11/22/19 1947  AST 23  ALT 16  ALKPHOS 32*  BILITOT 1.1  PROT 6.4*  ALBUMIN 3.5    CBG: No results for input(s): GLUCAP in the last 168 hours.   Recent Results (from the past 240 hour(s))  SARS Coronavirus 2 by RT PCR (hospital order, performed in [facility] hospital lab) Nasopharyngeal Nasopharyngeal Swab     Status: None   Collection Time: 11/22/19 10:32 PM   Specimen: Nasopharyngeal Swab  Result Value Ref Range Status   SARS Coronavirus 2 NEGATIVE NEGATIVE Final    Comment: (NOTE) SARS-CoV-2 target nucleic acids are NOT DETECTED. The SARS-CoV-2 RNA is generally detectable in upper and lower respiratory specimens during the acute phase of infection. The lowest concentration of SARS-CoV-2 viral copies this assay can detect is 250 copies / mL. Evonne Rinks negative result does not preclude  SARS-CoV-2 infection and should not be used as the sole basis for treatment or other patient management decisions.  Mychal Durio negative result may occur with improper specimen collection / handling, submission of specimen other than nasopharyngeal swab, presence of viral mutation(s) within the areas targeted by this assay, and inadequate number of viral copies (<250 copies / mL). Tahtiana Rozier negative result must be combined with clinical observations, patient history, and epidemiological information. Fact Sheet for Patients:   StrictlyIdeas.no Fact Sheet for Healthcare Providers: BankingDealers.co.za This test is not yet approved or cleared  by the [place] FDA and has been authorized for detection and/or diagnosis of SARS-CoV-2 by FDA under an Emergency Use Authorization (EUA).  This EUA will remain in effect (meaning this test can be used) for the duration of the COVID-19 declaration under Section 564(b)(1) of the Act, 21 U.S.C. section 360bbb-3(b)(1), unless the authorization is terminated or revoked sooner. Performed at [facility], [place] [address] [place], [place] [address]          Radiology Studies: CT Head Wo Contrast  Result Date: 11/22/2019 CLINICAL DATA:  Fall yesterday. On anticoagulation. Weakness for 3 weeks. EXAM: CT HEAD WITHOUT CONTRAST TECHNIQUE: Contiguous axial images were obtained from the base of the skull through the vertex without intravenous contrast. COMPARISON:  Head CT 06/20/2018 FINDINGS: Brain: Mild motion artifact limits assessment. No intracranial hemorrhage, mass effect, or midline shift. Generalized atrophy, stable from prior. No hydrocephalus. The basilar cisterns are patent. Mild chronic small vessel ischemia. Remote right cerebellar infarct. No evidence of territorial infarct or acute ischemia. No extra-axial or intracranial fluid collection. Vascular: Atherosclerosis of skullbase vasculature without hyperdense  vessel or abnormal calcification. Skull: No fracture or focal lesion. Sinuses/Orbits: No acute finding. Other: None. IMPRESSION: 1. No acute intracranial abnormality. No skull fracture. 2. Stable atrophy and chronic small vessel ischemia. Remote right cerebellar infarct. Electronically Signed   By: Keith Rake M.D.   On: 11/22/2019 19:32   DG Chest Port 1 View  Result Date: 11/22/2019 CLINICAL DATA:  Status post fall. EXAM: PORTABLE CHEST 1 VIEW COMPARISON:  June 24, 2018 FINDINGS: There is no evidence of acute infiltrate, pleural effusion or pneumothorax. The heart size and mediastinal contours are within normal limits. An artificial  cardiac valve is seen. There is marked severity calcification of the thoracic aorta. Degenerative changes seen involving the mid and lower thoracic spine. IMPRESSION: No active disease. Electronically Signed   By: Virgina Norfolk M.D.   On: 11/22/2019 23:16        Scheduled Meds: . alfuzosin  10 mg Oral Q breakfast  . digoxin  0.0625 mg Oral Daily  . finasteride  5 mg Oral QHS  . furosemide  40 mg Oral Daily  . metoprolol tartrate  12.5 mg Oral QHS  . metoprolol tartrate  25 mg Oral Daily  . potassium chloride  10 mEq Oral Daily   Continuous Infusions: . acyclovir 710 mg (11/23/19 1407)  . lactated ringers with kcl    . potassium chloride       LOS: 0 days    Time spent: over 30 min    Fayrene Helper, MD [facility]   To contact the attending provider between 7A-7P or the covering provider during after hours 7P-7A, please log into the web site [email or web address] and access using universal  password for that web site. If you do not have the password, please call the hospital operator.  11/23/2019, 2:57 PM

## 2019-11-23 NOTE — Social Work (Signed)
CSW received consult for residential hospice. CSW was unable to reach [facility] liaison due to business hour. Updated palliative that CSW will follow-up with referral Monday.  Criss Alvine, MSW, [place] Social Worker

## 2019-11-23 NOTE — H&P (Addendum)
History and Physical    [name] QTM:[id number] DOB: 04-Nov-1929 DOA: 11/22/2019  PCP: Lorrene Reid, PA-C  Patient coming from: Home, wife at bedside   I have personally briefly reviewed patient's old medical records in [place]  Chief Complaint: increase weakness, confusion  HPI: Wayne [name] is a 86 y.o. male with medical history significant for chronic atrial fibrillation on Eliquis, hypertension, aortic stenosis s/p TAVR, chronic diastolic heart failure, CAD s/p stent and BPH who presents with concerns of worsening weakness and confusion.  Wife at bedside provides history as patient has altered mental status.  She reports that about 3 weeks ago he was diagnosed with shingles to his left eye and scalp and was placed on 10 days of antiviral.  Since then he has had issues with feeling nauseous and at one point last week he had a fast heart rate as well as an "attack" where he would shake and per wife is due to anxiety.  EMS was called to the house to evaluate him but reportedly he had normal vitals and patient did not want to be transported.  Then yesterday patient suffered a fall and today was so weak that he could not get up from bed. Wife denies him complaining of any pain including headache, neck pain, or chest pain. Patient noted to have labored breathing in ED but wife says that happens occasionally and is not new. He was alert and oriented to self and his wife but did not know where he was or why he is here. Wife says this is not his baseline. Last Eliquis was this morning.  Of note, patient saw cardiology 3 days ago for symptoms of nausea and chest pain thought to be arrhthymias vs anxiety and was placed on a Zio patch and had a planned echo.   ED Course: He was afebrile but tachycardic and tachypneic and placed on 2 L via nasal cannula.  No leukocytosis. Plt of 97. K of 3.1. Glucose of 125. Normal creatinine of 1.10. Troponin of 33.EKG shows atrial  fibrillation with no ST or T wave changes.   CT head negative.   Review of Systems: Unable to fully obtain due to pt's AMS   Past Medical History:  Diagnosis Date  . Anxiety    "maybe; having trouble at night" (04/24/2018)  . Aortic stenosis   . Arthritis   . Atrial fibrillation, chronic ([place])   . BPH (benign prostatic hyperplasia)   . CAD (coronary artery disease)   . Chronic anticoagulation   . Heart murmur   . Hepatitis 1960s   "drank contaminated water" (04/23/2018)  . History of chicken pox   . HTN (hypertension)    "have never had high blood pressure" (04/23/2018)  . Mitral stenosis   . Old MI (myocardial infarction) 2007   s/p PCI to distal OM (no stent)  . Pneumonia    "1 time" (04/23/2018)  . Severe mitral regurgitation   . 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 CATARACT EXTRACTION W/ INTRAOCULAR LENS IMPLANT Left   . CORONARY ANGIOPLASTY  2007   Distal OM  . LAPAROSCOPIC APPENDECTOMY N/A 06/22/2018   Procedure: APPENDECTOMY LAPAROSCOPIC;  Surgeon: Georganna Skeans, MD;  Location: [facility];  Service: General;  Laterality: N/A;  . PROSTATE BIOPSY    . RIGHT/LEFT HEART CATH AND CORONARY ANGIOGRAPHY N/A 08/01/2017   Procedure:  RIGHT/LEFT HEART CATH AND CORONARY ANGIOGRAPHY;  Surgeon: Sherren Mocha, MD;  Location: [facility] CV LAB;  Service: Cardiovascular;  Laterality: N/A;  . TEE WITHOUT CARDIOVERSION N/A 07/20/2017   Procedure: TRANSESOPHAGEAL ECHOCARDIOGRAM (TEE);  Surgeon: Lelon Perla, MD;  Location: [facility] ENDOSCOPY;  Service: Cardiovascular;  Laterality: N/A;  . TEE WITHOUT CARDIOVERSION N/A 09/04/2017   Procedure: TRANSESOPHAGEAL ECHOCARDIOGRAM (TEE);  Surgeon: Sherren Mocha, MD;  Location: [facility];  Service: Open Heart Surgery;  Laterality: N/A;  . TRANSCATHETER AORTIC VALVE REPLACEMENT, TRANSFEMORAL N/A 09/04/2017   Procedure: TRANSCATHETER AORTIC  VALVE REPLACEMENT, TRANSFEMORAL;  Surgeon: Sherren Mocha, MD;  Location: [facility];  Service: Open Heart Surgery;  Laterality: N/A;     reports that he has never smoked. He has never used smokeless tobacco. He reports that he does not drink alcohol or use drugs.  Allergies  Allergen Reactions  . Penicillins Rash and Other (See Comments)    PATIENT HAS HAD A PCN REACTION WITH IMMEDIATE RASH, FACIAL/TONGUE/THROAT SWELLING, SOB, OR LIGHTHEADEDNESS WITH HYPOTENSION:  yes Has patient had a PCN reaction causing severe rash involving mucus membranes or skin necrosis: No Has patient had a PCN reaction that required hospitalization No Has patient had a PCN reaction occurring within the last 10 years: No If all of the above answers are "NO", then may proceed with Cephalosporin use.  . Prednisone Other (See Comments)    Wheezing  . Procaine Hcl Other (See Comments)    Passed out after being given this at dental appointment  . Levofloxacin Rash  . Oxycodone Other (See Comments)    constipation    Family History  Problem Relation Age of Onset  . Heart disease Mother   . Heart attack Mother   . Heart disease Father   . Heart attack Father   . Heart attack Brother   . Heart attack Sister   . Stroke Brother      Prior to Admission medications   Medication Sig Start Date End Date Taking? Authorizing Provider  acetaminophen (TYLENOL) 500 MG tablet Take 1,000 mg by mouth every 6 (six) hours as needed for moderate pain or headache.    Yes [provider]  alfuzosin (UROXATRAL) 10 MG 24 hr tablet Take 10 mg by mouth daily with breakfast.   Yes [provider]  ARTIFICIAL TEAR OP Place 1 drop into both eyes daily as needed (dry eyes).   Yes [provider]  digoxin (LANOXIN) 0.125 MG tablet Take 1 tablet by mouth once daily Patient taking differently: Take 0.0625 mg by mouth daily.  08/01/19  Yes Angelena Form R, PA-C  ELIQUIS 5 MG TABS tablet Take 1 tablet by mouth  twice daily Patient taking differently: Take 5 mg by mouth 2 (two) times daily.  05/23/19  Yes Lelon Perla, MD  finasteride (PROSCAR) 5 MG tablet Take 5 mg by mouth at bedtime.  02/14/17  Yes Lelon Perla, MD  furosemide (LASIX) 20 MG tablet Take 2 tablets (40 mg total) by mouth daily. 11/11/19 05/09/20 Yes Lelon Perla, MD  metoprolol tartrate (LOPRESSOR) 25 MG tablet Take 0.5 tablets (12.5 mg total) by mouth 2 (two) times daily. 25 mg in the morning and 12.5 mg in the evening Patient taking differently: Take 12.5-25 mg by mouth See admin instructions. Take one tablet (25 mg) by mouth every morning and 1/2 tablet (12.5 mg) in the evening 07/30/19  Yes Crenshaw, Denice Bors, MD  polyethylene glycol (MIRALAX / GLYCOLAX) packet Take 17 g by  mouth daily as needed for mild constipation. 06/28/18  Yes Meccariello, Bernita Raisin, DO  potassium chloride (KLOR-CON) 10 MEQ tablet Take 1 tablet (10 mEq total) by mouth daily. 10/23/19  Yes Lendon Colonel, NP  triamcinolone cream (KENALOG) 0.1 % Apply 1 application topically daily as needed (itching).    Yes [provider]    Physical Exam: Vitals:   11/22/19 2315 11/22/19 2330 11/22/19 2345 11/23/19 0051  BP: 134/67 106/87 130/65 (!) 122/59  Pulse: 88 95 93 97  Resp: (!) 30 20 (!) 30 (!) 24  Temp:    98.1 F (36.7 C)  TempSrc:    Oral  SpO2: 96% 96% 95% 94%    Constitutional: NAD, ill-appearing elderly male laying flat in bed asleep Vitals:   11/22/19 2315 11/22/19 2330 11/22/19 2345 11/23/19 0051  BP: 134/67 106/87 130/65 (!) 122/59  Pulse: 88 95 93 97  Resp: (!) 30 20 (!) 30 (!) 24  Temp:    98.1 F (36.7 C)  TempSrc:    Oral  SpO2: 96% 96% 95% 94%   Eyes: PERRL, lids and conjunctivae normal ENMT: Mucous membranes are moist.  Neck: normal, supple, no neck stiffness Respiratory: clear to auscultation bilaterally, no wheezing, no crackles.  Labored respiration with no accessory muscle use.  Cardiovascular: Regular rate and  rhythm, no murmurs / rubs / gallops. No extremity edema. 2+ pedal pulses Abdomen: no tenderness, no masses palpated.  Bowel sounds positive.  Musculoskeletal: no clubbing / cyanosis. No joint deformity upper and lower extremities. no contractures. Normal muscle tone.  Skin: Dry scaly lesions noted on left eyelid, left frontal and parietal scalp  neurologic: Patient awoke easily to voice and was alert and oriented to self and wife at bedside only.  Able to move all extremities on command but otherwise would drift back asleep. Psychiatric: Alert and oriented only to self and family at bedside.    Labs on Admission: I have personally reviewed following labs and imaging studies  CBC: Recent Labs  Lab 11/22/19 1947  WBC 8.5  NEUTROABS 6.6  HGB 14.2  HCT 43.5  MCV 93.1  PLT 97*   Basic Metabolic Panel: Recent Labs  Lab 11/22/19 1947  NA 139  K 3.1*  CL 105  CO2 22  GLUCOSE 125*  BUN 17  CREATININE 1.10  CALCIUM 8.4*   GFR: Estimated Creatinine Clearance: 46.5 mL/min (by C-G formula based on SCr of 1.1 mg/dL). Liver Function Tests: Recent Labs  Lab 11/22/19 1947  AST 23  ALT 16  ALKPHOS 32*  BILITOT 1.1  PROT 6.4*  ALBUMIN 3.5   No results for input(s): LIPASE, AMYLASE in the last 168 hours. No results for input(s): AMMONIA in the last 168 hours. Coagulation Profile: No results for input(s): INR, PROTIME in the last 168 hours. Cardiac Enzymes: No results for input(s): CKTOTAL, CKMB, CKMBINDEX, TROPONINI in the last 168 hours. BNP (last 3 results) No results for input(s): PROBNP in the last 8760 hours. HbA1C: No results for input(s): HGBA1C in the last 72 hours. CBG: No results for input(s): GLUCAP in the last 168 hours. Lipid Profile: No results for input(s): CHOL, HDL, LDLCALC, TRIG, CHOLHDL, LDLDIRECT in the last 72 hours. Thyroid Function Tests: No results for input(s): TSH, T4TOTAL, FREET4, T3FREE, THYROIDAB in the last 72 hours. Anemia Panel: No results  for input(s): VITAMINB12, FOLATE, FERRITIN, TIBC, IRON, RETICCTPCT in the last 72 hours. Urine analysis:    Component Value Date/Time   COLORURINE YELLOW 06/20/2018 0901  APPEARANCEUR CLEAR 06/20/2018 0901   LABSPEC 1.017 06/20/2018 0901   PHURINE 5.0 06/20/2018 0901   GLUCOSEU NEGATIVE 06/20/2018 0901   HGBUR NEGATIVE 06/20/2018 0901   BILIRUBINUR NEGATIVE 06/20/2018 0901   KETONESUR NEGATIVE 06/20/2018 0901   PROTEINUR NEGATIVE 06/20/2018 0901   UROBILINOGEN 0.2 12/15/2017 1533   NITRITE NEGATIVE 06/20/2018 0901   LEUKOCYTESUR NEGATIVE 06/20/2018 0901    Radiological Exams on Admission: CT Head Wo Contrast  Result Date: 11/22/2019 CLINICAL DATA:  Fall yesterday. On anticoagulation. Weakness for 3 weeks. EXAM: CT HEAD WITHOUT CONTRAST TECHNIQUE: Contiguous axial images were obtained from the base of the skull through the vertex without intravenous contrast. COMPARISON:  Head CT 06/20/2018 FINDINGS: Brain: Mild motion artifact limits assessment. No intracranial hemorrhage, mass effect, or midline shift. Generalized atrophy, stable from prior. No hydrocephalus. The basilar cisterns are patent. Mild chronic small vessel ischemia. Remote right cerebellar infarct. No evidence of territorial infarct or acute ischemia. No extra-axial or intracranial fluid collection. Vascular: Atherosclerosis of skullbase vasculature without hyperdense vessel or abnormal calcification. Skull: No fracture or focal lesion. Sinuses/Orbits: No acute finding. Other: None. IMPRESSION: 1. No acute intracranial abnormality. No skull fracture. 2. Stable atrophy and chronic small vessel ischemia. Remote right cerebellar infarct. Electronically Signed   By: Keith Rake M.D.   On: 11/22/2019 19:32   DG Chest Port 1 View  Result Date: 11/22/2019 CLINICAL DATA:  Status post fall. EXAM: PORTABLE CHEST 1 VIEW COMPARISON:  June 24, 2018 FINDINGS: There is no evidence of acute infiltrate, pleural effusion or pneumothorax. The  heart size and mediastinal contours are within normal limits. An artificial cardiac valve is seen. There is marked severity calcification of the thoracic aorta. Degenerative changes seen involving the mid and lower thoracic spine. IMPRESSION: No active disease. Electronically Signed   By: Virgina Norfolk M.D.   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  Will obtain MRI Brain.  Unfortunately patient is unable to get an LP here in the ED given last Eliquis use this morning. Will start acyclovir empirically and re-attempt LP after Eliquis wash out in 48 hrs  Check digoxin level  Hypokalemia  replete and check Mg   Thrombocytopenia Unclear etiology.  Possibly reactive. Monitor with morning CBC  Chronic atrial fibrillation will hold Eliquis for potential LP  Continue digoxin, metoprolol and Lasix when able to take PO continuous telemetry   Aortic stenosis s/p TAVR Last echo on 5/20 shows normal valve function will repeat Echo as this was also ordered by cardiology outpatient   hx of moderate MR follow repeat echo  HTN Continue metoprolol, Lasix when able to tolerate PO  BPH continue finasteride and alfuzosin when able to tolerate PO  DVT prophylaxis:SCDs Code Status: DNR- confirmed with wife Family Communication: Plan discussed with wife at bedside. All questions and concerns were addressed. disposition Plan: Home with at least 2 midnight stays  Consults called:  Admission status: inpatient    Status is: Inpatient  Remains inpatient appropriate because:Inpatient level of care appropriate due to severity of illness   Dispo: The patient is from: Home              Anticipated d/c is to: Home              Anticipated d/c date is: > 3 days              Patient currently is not  medically stable to d/c.         Orene Desanctis DO [facility]   If 7PM-7AM,  please contact night-coverage [email or web address]   11/23/2019, 1:07 AM

## 2019-11-23 NOTE — Progress Notes (Signed)
Pharmacy Antibiotic Note  Wayne [name] is a 86 y.o. male admitted on 11/22/2019 with Herpes zoster.  Pharmacy has been consulted for IV acyclovir dosing.  Plan: Acyclovir 10mg /kg q12h Will f/u renal function and pt's clinical condition Consider change to po as soon as able     Temp (24hrs), Avg:98.5 F (36.9 C), Min:98.1 F (36.7 C), Max:98.9 F (37.2 C)  Recent Labs  Lab 11/22/19 1947  WBC 8.5  CREATININE 1.10    Estimated Creatinine Clearance: 46.5 mL/min (by C-G formula based on SCr of 1.1 mg/dL).    Allergies  Allergen Reactions  . Penicillins Rash and Other (See Comments)    PATIENT HAS HAD A PCN REACTION WITH IMMEDIATE RASH, FACIAL/TONGUE/THROAT SWELLING, SOB, OR LIGHTHEADEDNESS WITH HYPOTENSION:  yes Has patient had a PCN reaction causing severe rash involving mucus membranes or skin necrosis: No Has patient had a PCN reaction that required hospitalization No Has patient had a PCN reaction occurring within the last 10 years: No If all of the above answers are "NO", then may proceed with Cephalosporin use.  . Prednisone Other (See Comments)    Wheezing  . Procaine Hcl Other (See Comments)    Passed out after being given this at dental appointment  . Levofloxacin Rash  . Oxycodone Other (See Comments)    constipation    Antimicrobials this admission: 6/6 Acyclovir >>    Thank you for allowing pharmacy to be a part of this patient's care.  Sherlon Handing, PharmD, BCPS Please see amion for complete clinical pharmacist phone list 11/23/2019 12:55 AM

## 2019-11-23 NOTE — Telephone Encounter (Signed)
   The patient's daughter called the answering service after-hours today. He is currently admitted for non-cardiac reasons with weakness, confusion, fall. He has chronic atrial fibrillation. His daughter is very concerned that his father needs cardiology involvement as she believes atrial fibrillation is causing his symptoms. He is pending spinal tap due to recent shingles. It is not clear this would be the case since he is always in atrial fib, but these are new symptoms. His daughter requested cardiology to come take over evaluation. I advised we cannot just consult on a patient without being formally consulted, and advised she speak with IM about her concerns who can then relay to cardiologist DOD if consultation felt necessary. Daughter plans to do so.  Charlie Pitter, PA-C

## 2019-11-23 NOTE — Consult Note (Addendum)
Palliative Medicine Inpatient Consult Note  Reason for consult:  Goals of Care  HPI:  Per intake H&P --> Wayne Filter Vanderburgis a 86 y.o.malewith medical history significant forchronic atrial fibrillation on Eliquis, hypertension, aortic stenosis s/p TAVR, chronic diastolic heart failure, CAD s/p stent and BPH who presents with concerns of worsening weakness and confusion.  Palliative care was asked to see Wayne [name] in the setting of a worsening decline over the past few weeks.   Clinical Assessment/Goals of Care: I have reviewed medical records including EPIC notes, labs and imaging, received report from bedside RN, assessed the patient.    I met with Devra Dopp and called Beth Coggin on speakerphone   to further discuss diagnosis prognosis, GOC, EOL wishes, disposition and options.   I introduced Palliative Medicine as specialized medical care for people living with serious illness. It focuses on providing relief from the symptoms and stress of a serious illness. The goal is to improve quality of life for both the patient and the family.  I asked Beth to tell me about Wayne [name]. She shares that "he is the most wonderful man on earth". She states that she is one of three children. Her mother, Collie Siad has been married to him for the past 26 years. 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 He is a [place] man and practices the [employer]   Beth and Collie Siad share that Ammiel has been declining for sometime. His needs at home have increased as has his dependence on others for assistance with bADLs. Beth feels the most notable illness Wayne [name] suffers from is his heart failure. She believes that his gradual and now acute decline are in all relation to this.   A detailed discussion was had today regarding advanced directives, Wayne [name] is patients HCPOA she plans to bring these papers in this afternoon.    Concepts specific to code status, artifical feeding  and hydration, continued IV antibiotics and rehospitalization was had.  Harl had always mentioned to his family that he would not want to be kept alive by artificial means. He is a DNAR/DNI.  The difference between a aggressive medical intervention path  and a palliative comfort care path for this patient at this time was had. Patients family have elected to transition him to comfort measures.  We discussed hospice services. Beth states that the patient was set up with Wayne [name] to come to [facility]'s home tomorrow but now they have been admitted.  We discussed possible transition to residential hospice which may end up being what is decided upon.   We talked about transition to comfort measures in house and what that would entail inclusive of medications to control pain, dyspnea, agitation, nausea, itching, and hiccups.  We discussed stopping all uneccessary measures such as blood draws, needle sticks, and frequent vital signs. Utilized reflective listening throughout our time together.   Addendum: I met with Collie Siad and Beth at bedside. They were able to speak with their sister in [place] and agree with stopping all cardiac medications, IVF, antibiotics, and antivirals. They would like for the patient to transition to [facility] residential hospice.  Decision Maker: Mercie Eon (Daughter) - [phone]  SUMMARY OF RECOMMENDATIONS   DNAR/DNI  Transition to comfort measures  POA documents copied and placed on chart  Chaplain support (Patient is [employer])  TOC --> Architectural technologist Care Planning: DNAR/DNI   Symptom Management:  Dyspnea: Pain:  -  Morphine 2.74m PO/SL Q1H PRN  - Morphine 1-213mIV Q1H PRN Fever:  - Tylenol 65024mO/PR Q6H PRN Agitation: Anxiety:  - Lorazepam 0.5-1mg55m  Q1H PRN Nausea:  - Zofran 4mg 105mIV Q6H PRN  Secretions:  - Glycopyrrolate 0.4mg I75mID Dry Eyes:  - Artificial Tears PRN Xerostomia:  - Biotene 15ml  84m - BID oral care Urinary Retention:  - Bladder scan if > 250ml pl36mand maintain foley  Constipation:  - Bisacodyl 10mg PR 74mQDay Spiritual:  - Chaplain consult   Palliative Prophylaxis:   Oral care, constipation, delirium  Additional Recommendations (Limitations, Scope, Preferences): Comfort measures   Psycho-social/Spiritual:   Desire for further Chaplaincy support: Yes  Additional Recommendations: Education on hospice   Prognosis: Poor, likely days to weeks  Discharge Planning: Discharge will likely be home with hospice versus a residential hospice  PPS: 10%   This conversation/these recommendations were discussed with patient primary care team, Dr. Powell  [place]ne [place]: 1400 Time Out: 1515 Total Time: 75 Greater than 50%  of this time was spent counseling and coordinating care related to the above assessment and plan.  Tresha Muzio [place] Cell Phone: [phone] secure chat with additional questions, if there is no response within 30 minutes please call the above phone number  Palliative Medicine Team providers are available by phone from 7am to 7pm daily and can be reached through the team cell phone.  Should this patient require assistance outside of these hours, please call the patient's attending physician.

## 2019-11-24 DIAGNOSIS — F411 Generalized anxiety disorder: Secondary | ICD-10-CM

## 2019-11-24 MED ORDER — LORAZEPAM 2 MG/ML IJ SOLN
0.5000 mg | INTRAMUSCULAR | Status: DC | PRN
  Administered 2019-11-26: 0.5 mg via INTRAVENOUS
  Filled 2019-11-24: qty 1

## 2019-11-24 NOTE — TOC Progression Note (Addendum)
Transition of Care [facility]) - Progression Note    Patient Details  Name: Wayne [name] MRN: [id number] Date of Birth: 1929/06/26  [facility]) CM/SW [place], RN Phone Number: [phone]  11/24/2019, 12:35 PM  Clinical Narrative:    [facility] to inquire about bed avaliability. Message left liason to call CM back. Will await return call.  11/24/2019 @ 1323 Spoke with Audrea Muscat liaison from [facility] residential hospice. There are no beds available at this time. Audrea Muscat will speak with Eustaquio Maize Coggin who is the decision maker for the patient and family.         Expected Discharge Plan and Services                                                 Social Determinants of Health (SDOH) Interventions    Readmission Risk Interventions No flowsheet data found.

## 2019-11-24 NOTE — Progress Notes (Addendum)
PROGRESS NOTE    Wayne [name]  XTK:[id number] DOB: 1929-08-31 DOA: 11/22/2019 PCP: Lorrene Reid, PA-C   Chief Complaint  Patient presents with  . Altered Mental Status  . Fall   Brief Narrative:  Wayne [name] is Wayne [name] 86 y.o. male with medical history significant for chronic atrial fibrillation on Eliquis, hypertension, aortic stenosis s/p TAVR, chronic diastolic heart failure, CAD s/p stent and BPH who presents with concerns of worsening weakness and confusion.  Wife at bedside provides history as patient has altered mental status.  She reports that about 3 weeks ago he was diagnosed with shingles to his left eye and scalp and was placed on 10 days of antiviral.  Since then he has had issues with feeling nauseous and at one point last week he had Wayne [name] heart rate as well as an "attack" where he would shake and per wife is due to anxiety.  EMS was called to the house to evaluate him but reportedly he had normal vitals and patient did not want to be transported.  Then yesterday patient suffered Wayne [name] fall and today was so weak that he could not get up from bed. Wife denies him complaining of any pain including headache, neck pain, or chest pain. Patient noted to have labored breathing in ED but wife says that happens occasionally and is not new. He was alert and oriented to self and his wife but did not know where he was or why he is here. Wife says this is not his baseline. Last Eliquis was this morning.  Of note, patient saw cardiology 3 days ago for symptoms of nausea and chest pain thought to be arrhthymias vs anxiety and was placed on Wayne [name] and had Wayne [name] planned echo.   Assessment & Plan:   Principal Problem:   AMS (altered mental status) Active Problems:   Chronic atrial fibrillation (HCC)   HTN (hypertension)   Hypokalemia   BPH with obstruction/lower urinary tract symptoms   S/P TAVR (transcatheter aortic valve replacement)   Mitral regurgitation   Palliative  care by specialist   Goals of care, counseling/discussion   DNR (do not resuscitate)   Terminal care   Anxiety state  Goals of Care:  Planning for hospice.  Discussed with family yesterday.  They had conversation with palliative care and spoke amongst family as well.  Mrs. Wayne [name] feels that he would want to be comfortable and that this would be consistent with his wishes, daughter in agreement. 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  Had follow up after completion of therapy with no vision loss.  Daughter notes at this appointment, father required wheelchair (typically walker) and had his head down, she called him "lethargic".  Daughter notes progressive weakness and confusion since shingles infection.   Planning for comfort measures as noted above  Addendum: discussed case with ID, as skin lesions crusted and dry, pt does not need to remain on airborne precautions - will d/c airborne precautions  Hypokalemia  Replace and follow  Thrombocytopenia Unclear etiology.  Possibly reactive. Monitor with morning CBC  Chronic atrial fibrillation will hold Eliquis for potential LP  Continue digoxin, metoprolol and Lasix when able to take PO continuous telemetry  Noted note to after hours answering service with note that daughter  is concerned that her father needs cardiology involvement -> we discussed plan for echo this morning, but not cardiology c/s -> will follow up with daughter her concerns, esp in setting of transition to comfort measures with palliative  Aortic stenosis s/p TAVR Last echo on 5/20 shows normal valve function will repeat Echo as this was also ordered by cardiology outpatient   hx of moderate  MR follow repeat echo  HTN Continue metoprolol, Lasix when able to tolerate PO  BPH continue finasteride and alfuzosin when able to tolerate PO  DVT prophylaxis: SCD Code Status: DNR Family Communication: wife at bedside Disposition:   Status is: Inpatient  Remains inpatient appropriate because:Inpatient level of care appropriate due to severity of illness   Dispo: The patient is from: Home              Anticipated d/c is to: pending              Anticipated d/c date is: > 3 days              Patient currently is not medically stable to d/c.  Consultants:   Palliative care  Procedures:  none  Antimicrobials:  Anti-infectives (From admission, onward)   Start     Dose/Rate Route Frequency Ordered Stop   11/23/19 0200  acyclovir (ZOVIRAX) 710 mg in dextrose 5 % 150 mL IVPB  Status:  Discontinued     10 mg/kg  70.8 kg 164.2 mL/hr over 60 Minutes Intravenous Every 12 hours 11/23/19 0056 11/23/19 1517     Subjective: Sleeping, got ativan before I saw him  Objective: Vitals:   11/23/19 0231 11/23/19 0830 11/24/19 0046 11/24/19 0822  BP: (!) 122/95 108/63 (!) 106/55 125/77  Pulse: (!) 107 96 94 91  Resp: 20 18    Temp: 99.4 F (37.4 C) 98.3 F (36.8 C) 98.3 F (36.8 C) 98.5 F (36.9 C)  TempSrc: Axillary Oral Axillary Oral  SpO2: 95% 95% 98% 99%  Weight: 76.4 kg     Height: (!) 6" (0.152 m)       Intake/Output Summary (Last 24 hours) at 11/24/2019 1400 Last data filed at 11/24/2019 0530 Gross per 24 hour  Intake 420 ml  Output 500 ml  Net -80 ml   Filed Weights   11/23/19 0231  Weight: 76.4 kg    Examination:  Limited exam due to comfort care NAD, sleeping, chronically ill appearing Slow, unlabored respirations Warm and dry Asleep after receiving ativan  Data Reviewed: I have personally reviewed following labs and imaging studies  CBC: Recent Labs  Lab 11/22/19 1947 11/23/19 0404  WBC 8.5 8.7  NEUTROABS 6.6  --   HGB 14.2 14.6  HCT 43.5  44.0  MCV 93.1 91.9  PLT 97* 91*    Basic Metabolic Panel: Recent Labs  Lab 11/22/19 1947 11/23/19 0404  NA 139 141  K 3.1* 3.0*  CL 105 103  CO2 22 24  GLUCOSE 125* 113*  BUN 17 16  CREATININE 1.10 1.12  CALCIUM 8.4* 8.7*  MG  --  2.2    GFR: CrCl cannot be calculated (Unknown ideal weight.).  Liver Function Tests: Recent Labs  Lab 11/22/19 1947  AST 23  ALT 16  ALKPHOS 32*  BILITOT 1.1  PROT 6.4*  ALBUMIN 3.5    CBG: No results for input(s): GLUCAP in the last 168 hours.   Recent Results (from the past 240 hour(s))  SARS Coronavirus 2 by RT PCR (hospital order, performed in  [facility] hospital lab) Nasopharyngeal Nasopharyngeal Swab     Status: None   Collection Time: 11/22/19 10:32 PM   Specimen: Nasopharyngeal Swab  Result Value Ref Range Status   SARS Coronavirus 2 NEGATIVE NEGATIVE Final    Comment: (NOTE) SARS-CoV-2 target nucleic acids are NOT DETECTED. The SARS-CoV-2 RNA is generally detectable in upper and lower respiratory specimens during the acute phase of infection. The lowest concentration of SARS-CoV-2 viral copies this assay can detect is 250 copies / mL. Esma Kilts negative result does not preclude SARS-CoV-2 infection and should not be used as the sole basis for treatment or other patient management decisions.  Alysandra Lobue negative result may occur with improper specimen collection / handling, submission of specimen other than nasopharyngeal swab, presence of viral mutation(s) within the areas targeted by this assay, and inadequate number of viral copies (<250 copies / mL). Carlous Olivares negative result must be combined with clinical observations, patient history, and epidemiological information. Fact Sheet for Patients:   StrictlyIdeas.no Fact Sheet for Healthcare Providers: BankingDealers.co.za This test is not yet approved or cleared  by the [place] FDA and has been authorized for detection and/or diagnosis of  SARS-CoV-2 by FDA under an Emergency Use Authorization (EUA).  This EUA will remain in effect (meaning this test can be used) for the duration of the COVID-19 declaration under Section 564(b)(1) of the Act, 21 U.S.C. section 360bbb-3(b)(1), unless the authorization is terminated or revoked sooner. Performed at [facility], [place] [address], [place], [place] [address]   Culture, blood (routine x 2)     Status: None (Preliminary result)   Collection Time: 11/23/19  2:33 PM   Specimen: BLOOD RIGHT HAND  Result Value Ref Range Status   Specimen Description BLOOD RIGHT HAND  Final   Special Requests   Final    BOTTLES DRAWN AEROBIC AND ANAEROBIC Blood Culture adequate volume   Culture   Final    NO GROWTH < 24 HOURS Performed at [facility], [place] [address], [place], [place] [address]    Report Status PENDING  Incomplete  Culture, blood (routine x 2)     Status: None (Preliminary result)   Collection Time: 11/23/19  2:37 PM   Specimen: BLOOD LEFT HAND  Result Value Ref Range Status   Specimen Description BLOOD LEFT HAND  Final   Special Requests   Final    BOTTLES DRAWN AEROBIC AND ANAEROBIC Blood Culture adequate volume   Culture   Final    NO GROWTH < 24 HOURS Performed at [facility], [place] [address], [place], [place] [address]    Report Status PENDING  Incomplete         Radiology Studies: CT Head Wo Contrast  Result Date: 11/22/2019 CLINICAL DATA:  Fall yesterday. On anticoagulation. Weakness for 3 weeks. EXAM: CT HEAD WITHOUT CONTRAST TECHNIQUE: Contiguous axial images were obtained from the base of the skull through the vertex without intravenous contrast. COMPARISON:  Head CT 06/20/2018 FINDINGS: Brain: Mild motion artifact limits assessment. No intracranial hemorrhage, mass effect, or midline shift. Generalized atrophy, stable from prior. No hydrocephalus. The basilar cisterns are patent. Mild chronic small vessel ischemia. Remote right cerebellar  infarct. No evidence of territorial infarct or acute ischemia. No extra-axial or intracranial fluid collection. Vascular: Atherosclerosis of skullbase vasculature without hyperdense vessel or abnormal calcification. Skull: No fracture or focal lesion. Sinuses/Orbits: No acute finding. Other: None. IMPRESSION: 1. No acute intracranial abnormality. No skull fracture. 2. Stable atrophy and chronic  small vessel ischemia. Remote right cerebellar infarct. Electronically Signed   By: Keith Rake M.D.   On: 11/22/2019 19:32   DG Chest Port 1 View  Result Date: 11/22/2019 CLINICAL DATA:  Status post fall. EXAM: PORTABLE CHEST 1 VIEW COMPARISON:  June 24, 2018 FINDINGS: There is no evidence of acute infiltrate, pleural effusion or pneumothorax. The heart size and mediastinal contours are within normal limits. An artificial cardiac valve is seen. There is marked severity calcification of the thoracic aorta. Degenerative changes seen involving the mid and lower thoracic spine. IMPRESSION: No active disease. Electronically Signed   By: Virgina Norfolk M.D.   On: 11/22/2019 23:16        Scheduled Meds:  Continuous Infusions:    LOS: 1 day    Time spent: over 30 min    Fayrene Helper, MD [facility]   To contact the attending provider between 7A-7P or the covering provider during after hours 7P-7A, please log into the web site [email or web address] and access using universal [place] password for that web site. If you do not have the password, please call the hospital operator.  11/24/2019, 2:00 PM

## 2019-11-24 NOTE — Progress Notes (Signed)
Daily Progress Note   Patient Name: Wayne [name]       Date: 11/24/2019 DOB: 05-29-1930  Age: 86 y.o. MRN#: [id number] Attending Physician: Elodia Florence., * Primary Care Physician: Lorrene Reid, PA-C Admit Date: 11/22/2019  Reason for Consultation/Follow-up: Establishing goals of care and Terminal Care  Subjective: Patient lethargic but appears comfortable. No s/s of pain or distress. Regular, shallow respirations.  Discussed with RN. Patient recently given ativan for anxiety/restlessness. Ativan effective.   GOC: F/u with wife, Collie Siad at bedside. Discussed events leading up to hospitalization, course of hospitalization and plan for comfort measures. Discussed comfort measures and symptom management medications. Wife confirms plan for comfort and that Jlyn is a peace with EOL. He is 'ready.' She shares their strong [place] faith that this is in God's hands. They are not afraid. Emotional/spiritual support provide   Shortly after, spoke with daughter Eustaquio Maize). Beth understands there is a long wait list for [facility] and is going to discuss with her mother about transfer to [facility] hosp[facility] facility. Beth would prefer her father discharge sooner than later to be in a more comfortable environment at hospice. Answered questions regarding comfort care plan, symptom management medications, comfort feeds, and visitor policy. Beth has PMT contact information and understands she can call with questions or concerns.   Length of Stay: 1  Current Medications: Scheduled Meds:    Continuous Infusions:   PRN Meds: acetaminophen **OR** acetaminophen, antiseptic oral rinse, bisacodyl, glycopyrrolate, LORazepam, morphine, morphine injection, ondansetron **OR**  ondansetron (ZOFRAN) IV, polyvinyl alcohol  Physical Exam Vitals and nursing note reviewed.  Constitutional:      Appearance: He is cachectic. He is ill-appearing.  HENT:     Head: Normocephalic and atraumatic.  Cardiovascular:     Heart sounds: Murmur present.  Pulmonary:     Effort: No tachypnea, accessory muscle usage or respiratory distress.     Breath sounds: Normal breath sounds.     Comments: Regular, shallow Abdominal:     Tenderness: There is no abdominal tenderness.  Skin:    General: Skin is warm and dry.     Coloration: Skin is pale.  Neurological:     Mental Status: He is lethargic.            Vital Signs: BP 125/77 (BP Location:  Right Arm)    Pulse 91    Temp 98.5 F (36.9 C) (Oral)    Resp 18    Ht (!) 6" (0.152 m)    Wt 76.4 kg    SpO2 99%    BMI 3289.45 kg/m  SpO2: SpO2: 99 % O2 Device: O2 Device: Nasal Cannula O2 Flow Rate:    Intake/output summary:   Intake/Output Summary (Last 24 hours) at 11/24/2019 1142 Last data filed at 11/24/2019 0530 Gross per 24 hour  Intake 420 ml  Output 500 ml  Net -80 ml   LBM:   Baseline Weight: Weight: 76.4 kg Most recent weight: Weight: 76.4 kg       Palliative Assessment/Data: PPS 20%     Patient Active Problem List   Diagnosis Date Noted   AMS (altered mental status) 11/23/2019   Mitral regurgitation 11/23/2019   Palliative care by specialist    Goals of care, counseling/discussion    DNR (do not resuscitate)    Terminal care    Daytime somnolence 07/17/2018   Appendicitis 07/17/2018   Chronic anemia 07/17/2018   Oxygen desaturation    Abnormality of gait    Impaired mobility and ADLs    Acute on chronic diastolic heart failure (HCC)    Sepsis (HCC) 06/20/2018   SIRS (systemic inflammatory response syndrome) (HCC) 06/20/2018   Dyspnea    Normocytic anemia    Appendicitis with perforation 04/21/2018   Pain in left knee 02/26/2018   Pain in right knee 02/26/2018   Fall 01/21/2018    Closed fracture of left inferior pubic ramus ([place]) 01/21/2018   Constipation 12/19/2017    hernia of linea alba 12/19/2017   Osteoarthritis of hip 09/24/2017   Gastroenteritis 09/10/2017   S/P TAVR (transcatheter aortic valve replacement) 09/04/2017   Severe mitral regurgitation    Insomnia 07/10/2017   High risk medication use 07/10/2017   Long term current use of anticoagulant 07/10/2017   Chronic diastolic CHF (congestive heart failure) ([place]) 05/20/2017   Hyponatremia 05/20/2017   Severe aortic stenosis 05/20/2017   Urinary retention    Arthritis of facet joints at multiple vertebral levels 04/26/2017   Spinal stenosis at L4-L5 level 04/26/2017   Lumbar radiculopathy, chronic 04/26/2017   Benign colonic polyp- 25 yrs ago.  11/16/2016   Vitamin D deficiency 11/16/2016   H/O non anemic vitamin B12 deficiency 11/16/2016   Chronic Colonic dysmotility 11/16/2016   Hypokalemia 09/13/2016   BPH with obstruction/lower urinary tract symptoms 09/13/2016   Urinary frequency 07/10/2016   Left bundle branch block (LBBB) on electrocardiogram 06/30/2016   History of stroke 06/30/2016   Generalized weakness 06/30/2016   Asthenia 06/30/2016   Osteoarthritis 04/04/2016   Encounter for therapeutic drug monitoring 08/06/2013   Chronic anticoagulation    HTN (hypertension)    CAD (coronary artery disease)    Chronic atrial fibrillation ([place]) 09/13/2010   h/o CVA (cerebrovascular accident due to intracerebral hemorrhage) 09/13/2010    Palliative Care Assessment & Plan   Patient Profile: Per intake H&P --> Arman Filter Vanderburgis a 86 y.o.malewith medical history significant forchronic atrial fibrillation on Eliquis, hypertension, aortic stenosis s/p TAVR, chronic diastolic heart failure, CAD s/p stent and BPH who presents with concerns of worsening weakness and confusion.  Palliative care was asked to see [name] in the setting of a worsening decline  over the past few weeks.   Assessment: Acute Metabolic Encephalopathy Chronic atrial fibrillation Thrombocytopenia Aortic stenosis s/p TAVR Hx of moderate MR Recent shingles Poor oral intake  Weakness  Recommendations/Plan:  Comfort focused care plan  DNR/DNI  Continue prn comfort meds on MAR.  Continue comfort feeds per patient/family request. Aspiration precautions.   Unrestricted visitor access for EOL care.  Waiting on hospice bed. Pending [facility] referral. Daughter to further discuss with her mother consideration for transfer to [facility] home. Daughter prefers transfer out of the hospital as soon as bed is available, as she would like her father in a more comfortable setting.   Goals of Care and Additional Recommendations:  Limitations on Scope of Treatment: Full Comfort Care  Code Status: DNR   Code Status Orders  (From admission, onward)         Start     Ordered   11/23/19 1515  Do not attempt resuscitation (DNR)  Continuous    Question Answer Comment  In the event of cardiac or respiratory ARREST Do not call a code blue   In the event of cardiac or respiratory ARREST Do not perform Intubation, CPR, defibrillation or ACLS   In the event of cardiac or respiratory ARREST Use medication by any route, position, wound care, and other measures to relive pain and suffering. May use oxygen, suction and manual treatment of airway obstruction as needed for comfort.   Comments Patient and wife in agreement, states DNR has been signed.  Has been DNR in past.      11/23/19 1517        Code Status History    Date Active Date Inactive Code Status Order ID Comments User Context   11/23/2019 0022 11/23/2019 1517 DNR [id number]  Orene Desanctis, DO ED   06/20/2018 1620 06/28/2018 1821 DNR [id number]  Cleophas Dunker, DO ED   04/21/2018 1547 04/26/2018 1828 DNR [id number]  Kathrene Alu, MD ED   01/22/2018 0609 01/28/2018 2308 DNR [id number]  Ivor Costa, MD Inpatient    01/22/2018 0059 01/22/2018 0609 Full Code [id number]  Ivor Costa, MD ED   09/10/2017 1952 09/15/2017 1657 Full Code [id number]  Quintella Baton, MD ED   09/04/2017 1214 09/06/2017 1546 Full Code [id number]  Gaye Pollack, MD Inpatient   08/01/2017 1248 08/01/2017 2041 Full Code [id number]  Sherren Mocha, MD Inpatient   05/20/2017 2331 05/23/2017 1706 Full Code [id number]  Jani Gravel, MD Inpatient   06/30/2016 2035 07/04/2016 1535 Full Code [id number]  Rama, Venetia Maxon, MD Inpatient   Advance Care Planning Activity       Prognosis:   Poor prognosis: less than two weeks if not days  Discharge Planning:  Hospice facility  Care plan was discussed with wife, daughter, RN  Thank you for allowing the Palliative Medicine Team to assist in the care of this patient.   Time In: 1020- 1130- Time Out: [id number] 1165 Total Time 30 Prolonged Time Billed no      Greater than 50%  of this time was spent counseling and coordinating care related to the above assessment and plan.  Ihor Dow, DNP, FNP-C Palliative Medicine Team  Phone: [phone] Fax: [phone]  Please contact Palliative Medicine Team phone at [phone] for questions and concerns.

## 2019-11-25 NOTE — Progress Notes (Signed)
PROGRESS NOTE    Wayne [name]  CHY:[id number] DOB: Jul 13, 1929 DOA: 11/22/2019 PCP: Lorrene Reid, PA-C   Chief Complaint  Patient presents with  . Altered Mental Status  . Fall   Brief Narrative:  Wayne [name] is Wayne [name] 86 y.o. male with medical history significant for chronic atrial fibrillation on Eliquis, hypertension, aortic stenosis s/p TAVR, chronic diastolic heart failure, CAD s/p stent and BPH who presents with concerns of worsening weakness and confusion.  Wife at bedside provides history as patient has altered mental status.  She reports that about 3 weeks ago he was diagnosed with shingles to his left eye and scalp and was placed on 10 days of antiviral.  Since then he has had issues with feeling nauseous and at one point last week he had Wayne [name] fast heart rate as well as an "attack" where he would shake and per wife is due to anxiety.  EMS was called to the house to evaluate him but reportedly he had normal vitals and patient did not want to be transported.  Then yesterday patient suffered Charrisse Masley fall and today was so weak that he could not get up from bed. Wife denies him complaining of any pain including headache, neck pain, or chest pain. Patient noted to have labored breathing in ED but wife says that happens occasionally and is not new. He was alert and oriented to self and his wife but did not know where he was or why he is here. Wife says this is not his baseline. Last Eliquis was this morning.  Of note, patient saw cardiology 3 days ago for symptoms of nausea and chest pain thought to be arrhthymias vs anxiety and was placed on Tyrie Porzio Zio patch and had Ashton Belote planned echo.   He was admitted with worsening weakness and confusion following recent treatment for herpes zoster ophthalmicus.  After discussion with palliative care, plan for comfort measures and inpatient hospice.  Assessment & Plan:   Principal Problem:   AMS (altered mental status) Active Problems:   Chronic  atrial fibrillation (HCC)   HTN (hypertension)   Hypokalemia   BPH with obstruction/lower urinary tract symptoms   S/P TAVR (transcatheter aortic valve replacement)   Mitral regurgitation   Palliative care by specialist   Goals of care, counseling/discussion   DNR (do not resuscitate)   Terminal care   Anxiety state  Goals of Care:  Planning for hospice.  Discussed with family yesterday.  They had conversation with palliative care and spoke amongst family as well.  Mrs. Millstein feels that he would want to be comfortable and that this would be consistent with his wishes, daughters in agreement. 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  Had follow up after completion of therapy with no vision loss.  Daughter notes at this appointment, father required wheelchair (typically walker) and had his head down, she called him "lethargic".  Daughter notes progressive weakness and confusion since shingles infection.   Planning for comfort measures as noted above  Addendum: discussed case with ID, as skin lesions crusted and dry, pt does not need to remain on airborne precautions - will d/c airborne precautions  Hypokalemia  Replace and follow  Thrombocytopenia Unclear etiology.  Possibly reactive. Monitor with morning CBC  Chronic atrial fibrillation will hold  Eliquis for potential LP  Continue digoxin, metoprolol and Lasix when able to take PO continuous telemetry  Comfort measures  Aortic stenosis s/p TAVR Last echo on 5/20 shows normal valve function  hx of moderate MR follow repeat echo  HTN Continue metoprolol, Lasix when able to tolerate PO  BPH continue finasteride and alfuzosin when able to tolerate  PO  DVT prophylaxis: SCD Code Status: DNR Family Communication: daughter at bedside Disposition:   Status is: Inpatient  Remains inpatient appropriate because:Inpatient level of care appropriate due to severity of illness   Dispo: The patient is from: Home              Anticipated d/c is to: pending              Anticipated d/c date is: > 3 days              Patient currently is not medically stable to d/c.  Consultants:   Palliative care  Procedures:  none  Antimicrobials:  Anti-infectives (From admission, onward)   Start     Dose/Rate Route Frequency Ordered Stop   11/23/19 0200  acyclovir (ZOVIRAX) 710 mg in dextrose 5 % 150 mL IVPB  Status:  Discontinued     10 mg/kg  70.8 kg 164.2 mL/hr over 60 Minutes Intravenous Every 12 hours 11/23/19 0056 11/23/19 1517     Subjective: Sleeping, daughter at bedside  Objective: Vitals:   11/23/19 0830 11/24/19 0046 11/24/19 0822 11/24/19 2210  BP: 108/63 (!) 106/55 125/77 (!) 104/56  Pulse: 96 94 91 95  Resp: 18   16  Temp: 98.3 F (36.8 C) 98.3 F (36.8 C) 98.5 F (36.9 C) 98 F (36.7 C)  TempSrc: Oral Axillary Oral Axillary  SpO2: 95% 98% 99% 100%  Weight:      Height:        Intake/Output Summary (Last 24 hours) at 11/25/2019 1446 Last data filed at 11/25/2019 0600 Gross per 24 hour  Intake 220 ml  Output 950 ml  Net -730 ml   Filed Weights   11/23/19 0231  Weight: 76.4 kg    Examination:  Limited exam with comfort measures NAD, sleeping Unlabored respirations Warm and dry  Data Reviewed: I have personally reviewed following labs and imaging studies  CBC: Recent Labs  Lab 11/22/19 1947 11/23/19 0404  WBC 8.5 8.7  NEUTROABS 6.6  --   HGB 14.2 14.6  HCT 43.5 44.0  MCV 93.1 91.9  PLT 97* 91*    Basic Metabolic Panel: Recent Labs  Lab 11/22/19 1947 11/23/19 0404  NA 139 141  K 3.1* 3.0*  CL 105 103  CO2 22 24  GLUCOSE 125* 113*  BUN 17 16  CREATININE 1.10 1.12  CALCIUM 8.4* 8.7*  MG   --  2.2    GFR: CrCl cannot be calculated (Unknown ideal weight.).  Liver Function Tests: Recent Labs  Lab 11/22/19 1947  AST 23  ALT 16  ALKPHOS 32*  BILITOT 1.1  PROT 6.4*  ALBUMIN 3.5    CBG: No results for input(s): GLUCAP in the last 168 hours.   Recent Results (from the past 240 hour(s))  SARS Coronavirus 2 by RT PCR (hospital order, performed in [facility] hospital lab) Nasopharyngeal Nasopharyngeal Swab     Status: None   Collection Time: 11/22/19 10:32 PM   Specimen: Nasopharyngeal Swab  Result Value Ref Range Status   SARS Coronavirus 2 NEGATIVE NEGATIVE Final    Comment: (NOTE) SARS-CoV-2 target nucleic  acids are NOT DETECTED. The SARS-CoV-2 RNA is generally detectable in upper and lower respiratory specimens during the acute phase of infection. The lowest concentration of SARS-CoV-2 viral copies this assay can detect is 250 copies / mL. Wayne [name] negative result does not preclude SARS-CoV-2 infection and should not be used as the sole basis for treatment or other patient management decisions.  Wayne [name] negative result may occur with improper specimen collection / handling, submission of specimen other than nasopharyngeal swab, presence of viral mutation(s) within the areas targeted by this assay, and inadequate number of viral copies (<250 copies / mL). Wayne [name] negative result must be combined with clinical observations, patient history, and epidemiological information. Fact Sheet for Patients:   StrictlyIdeas.no Fact Sheet for Healthcare Providers: BankingDealers.co.za This test is not yet approved or cleared  by the [place] FDA and has been authorized for detection and/or diagnosis of SARS-CoV-2 by FDA under an Emergency Use Authorization (EUA).  This EUA will remain in effect (meaning this test can be used) for the duration of the COVID-19 declaration under Section 564(b)(1) of the Act, 21 U.S.C. section 360bbb-3(b)(1),  unless the authorization is terminated or revoked sooner. Performed at [facility], [place] [address] [place], [place] [address]   Culture, blood (routine x 2)     Status: None (Preliminary result)   Collection Time: 11/23/19  2:33 PM   Specimen: BLOOD RIGHT HAND  Result Value Ref Range Status   Specimen Description BLOOD RIGHT HAND  Final   Special Requests   Final    BOTTLES DRAWN AEROBIC AND ANAEROBIC Blood Culture adequate volume   Culture   Final    NO GROWTH 2 DAYS Performed at [facility], [place] [address], [place], [place] [address]    Report Status PENDING  Incomplete  Culture, blood (routine x 2)     Status: None (Preliminary result)   Collection Time: 11/23/19  2:37 PM   Specimen: BLOOD LEFT HAND  Result Value Ref Range Status   Specimen Description BLOOD LEFT HAND  Final   Special Requests   Final    BOTTLES DRAWN AEROBIC AND ANAEROBIC Blood Culture adequate volume   Culture   Final    NO GROWTH 2 DAYS Performed at [facility], [place] [address] [place],  [address]    Report Status PENDING  Incomplete         Radiology Studies: No results found.      Scheduled Meds:  Continuous Infusions:    LOS: 2 days    Time spent: over 30 min    Fayrene Helper, MD [facility]   To contact the attending provider between 7A-7P or the covering provider during after hours 7P-7A, please log into the web site [email or web address] and access using universal [place] password for that web site. If you do not have the password, please call the hospital operator.  11/25/2019, 2:46 PM

## 2019-11-25 NOTE — Progress Notes (Signed)
Daily Progress Note   Patient Name: Wayne [name]       Date: 11/25/2019 DOB: 06/09/1930  Age: 86 y.o. MRN#: [id number] Attending Physician: Elodia Florence., * Primary Care Physician: Lorrene Reid, PA-C Admit Date: 11/22/2019  Reason for Consultation/Follow-up: Establishing goals of care and Terminal Care  Subjective: Patient drowsy but appears comfortable. No s/s of pain or distress.   GOC: Wife and daughter Almyra Free) at bedside. Discussed comfort measures, symptom management medications, EOL expectations, comfort feeds, non-verbal s/s of discomfort. Answered questions and concerns about care plan. Doristine Bosworth arrived to bedside at the end of my visit.   Updated daughter, Eustaquio Maize via telephone.   Length of Stay: 2  Current Medications: Scheduled Meds:    Continuous Infusions:   PRN Meds: acetaminophen **OR** acetaminophen, antiseptic oral rinse, bisacodyl, glycopyrrolate, LORazepam, morphine, morphine injection, ondansetron **OR** ondansetron (ZOFRAN) IV, polyvinyl alcohol  Physical Exam Vitals and nursing note reviewed.  Constitutional:      Appearance: He is cachectic. He is ill-appearing.  HENT:     Head: Normocephalic and atraumatic.  Cardiovascular:     Heart sounds: Murmur present.  Pulmonary:     Effort: No tachypnea, accessory muscle usage or respiratory distress.     Breath sounds: Normal breath sounds.     Comments: Regular, shallow Abdominal:     Tenderness: There is no abdominal tenderness.  Skin:    General: Skin is warm and dry.     Coloration: Skin is pale.  Neurological:     Mental Status: He is lethargic.            Vital Signs: BP (!) 104/56 (BP Location: Right Arm)   Pulse 95   Temp 98 F (36.7 C) (Axillary)   Resp 16   Ht (!) 6" (0.152  m)   Wt 76.4 kg   SpO2 100%   BMI 3289.45 kg/m  SpO2: SpO2: 100 % O2 Device: O2 Device: Nasal Cannula O2 Flow Rate:    Intake/output summary:   Intake/Output Summary (Last 24 hours) at 11/25/2019 1050 Last data filed at 11/25/2019 0600 Gross per 24 hour  Intake 220 ml  Output 950 ml  Net -730 ml   LBM:   Baseline Weight: Weight: 76.4 kg Most recent weight: Weight: 76.4 kg       Palliative Assessment/Data:  PPS 20%     Patient Active Problem List   Diagnosis Date Noted  . Anxiety state   . AMS (altered mental status) 11/23/2019  . Mitral regurgitation 11/23/2019  . Palliative care by specialist   . Goals of care, counseling/discussion   . DNR (do not resuscitate)   . Terminal care   . Daytime somnolence 07/17/2018  . Appendicitis 07/17/2018  . Chronic anemia 07/17/2018  . Oxygen desaturation   . Abnormality of gait   . Impaired mobility and ADLs   . Acute on chronic diastolic heart failure ([place])   . Sepsis ([place]) 06/20/2018  . SIRS (systemic inflammatory response syndrome) ([place]) 06/20/2018  . Dyspnea   . Normocytic anemia   . Appendicitis with perforation 04/21/2018  . Pain in left knee 02/26/2018  . Pain in right knee 02/26/2018  . Fall 01/21/2018  . Closed fracture of left inferior pubic ramus ([place]) 01/21/2018  . Constipation 12/19/2017  .  hernia of linea alba 12/19/2017  . Osteoarthritis of hip 09/24/2017  . Gastroenteritis 09/10/2017  . S/P TAVR (transcatheter aortic valve replacement) 09/04/2017  . Severe mitral regurgitation   . Insomnia 07/10/2017  . High risk medication use 07/10/2017  . Long term current use of anticoagulant 07/10/2017  . Chronic diastolic CHF (congestive heart failure) ([place]) 05/20/2017  . Hyponatremia 05/20/2017  . Severe aortic stenosis 05/20/2017  . Urinary retention   . 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 Spinal stenosis at L4-L5 level 04/26/2017  . Lumbar radiculopathy, chronic 04/26/2017  .  Benign colonic polyp- 25 yrs ago.  11/16/2016  . Vitamin D deficiency 11/16/2016  . H/O non anemic vitamin B12 deficiency 11/16/2016  . Chronic Colonic dysmotility 11/16/2016  . Hypokalemia 09/13/2016  . BPH with obstruction/lower urinary tract symptoms 09/13/2016  . Urinary frequency 07/10/2016  . Left bundle branch block (LBBB) on electrocardiogram 06/30/2016  . History of stroke 06/30/2016  . Generalized weakness 06/30/2016  . Asthenia 06/30/2016  . Osteoarthritis 04/04/2016  . Encounter for therapeutic drug monitoring 08/06/2013  . Chronic anticoagulation   . HTN (hypertension)   . CAD (coronary artery disease)   . Chronic atrial fibrillation ([place]) 09/13/2010  . h/o CVA (cerebrovascular accident due to intracerebral hemorrhage) 09/13/2010    Palliative Care Assessment & Plan   Patient Profile: Per intake H&P --> Jarelle Ates Vanderburgis a 86 y.o.malewith medical history significant forchronic atrial fibrillation on Eliquis, hypertension, aortic stenosis s/p TAVR, chronic diastolic heart failure, CAD s/p stent and BPH who presents with concerns of worsening weakness and confusion.  Palliative care was asked to see [name] in the setting of a worsening decline over the past few weeks.   Assessment: Acute Metabolic Encephalopathy Chronic atrial fibrillation Thrombocytopenia Aortic stenosis s/p TAVR Hx of moderate MR Recent shingles Poor oral intake Weakness  Recommendations/Plan:  Comfort focused care plan  DNR/DNI  Continue prn comfort meds on MAR.  Continue comfort feeds per patient/family request. Aspiration precautions.   Unrestricted visitor access for EOL care.  Pending [facility] hospice bed. Family agreeable with transfer to 6N for comfort care if bed becomes available.   Goals of Care and Additional Recommendations:  Limitations on Scope of Treatment: Full Comfort Care  Code Status: DNR   Code Status Orders  (From admission, onward)          Start     Ordered   11/23/19 1515  Do not attempt resuscitation (DNR)  Continuous    Question Answer Comment  In the event of cardiac or respiratory ARREST Do not call a "code blue"   In the event of cardiac or respiratory ARREST Do not perform Intubation, CPR, defibrillation or ACLS   In the event of cardiac or respiratory ARREST Use medication by any route, position, wound care, and other measures to relive pain and suffering. May use oxygen, suction and manual treatment of airway obstruction as needed for comfort.   Comments Patient and wife in agreement, states DNR has been signed.  Has been DNR in past.      11/23/19 1517        Code Status History    Date Active Date Inactive Code Status Order ID Comments User Context   11/23/2019 0022 11/23/2019 1517 DNR [id number]  Orene Desanctis, DO ED   06/20/2018 1620 06/28/2018 1821 DNR [id number]  Cleophas Dunker, DO ED   04/21/2018 1547 04/26/2018 1828 DNR [id number]  Kathrene Alu, MD ED   01/22/2018 0609 01/28/2018 2308 DNR [id number]  Ivor Costa, MD Inpatient   01/22/2018 0059 01/22/2018 0609 Full Code [id number]  Ivor Costa, MD ED   09/10/2017 1952 09/15/2017 1657 Full Code [id number]  Quintella Baton, MD ED   09/04/2017 1214 09/06/2017 1546 Full Code [id number]  Gaye Pollack, MD Inpatient   08/01/2017 1248 08/01/2017 2041 Full Code [id number]  Sherren Mocha, MD Inpatient   05/20/2017 2331 05/23/2017 1706 Full Code [id number]  Jani Gravel, MD Inpatient   06/30/2016 2035 07/04/2016 1535 Full Code [id number]  Rama, Venetia Maxon, MD Inpatient   Advance Care Planning Activity       Prognosis:   Poor prognosis: less than two weeks if not days  Discharge Planning:  Hospice facility   Care plan was discussed with wife, daughters, RN  Thank you for allowing the Palliative Medicine Team to assist in the care of this patient.   Time In: 1030- Time Out: 1050 Total Time 20 Prolonged Time Billed no      Greater than 50% of this time was spent counseling and  coordinating care related to the above assessment and plan.   Ihor Dow, DNP, FNP-C Palliative Medicine Team  Phone: [phone] Fax: [phone]  Please contact Palliative Medicine Team phone at [phone] for questions and concerns.

## 2019-11-26 DIAGNOSIS — E876 Hypokalemia: Secondary | ICD-10-CM

## 2019-11-26 DIAGNOSIS — G9341 Metabolic encephalopathy: Principal | ICD-10-CM

## 2019-11-26 DIAGNOSIS — Z515 Encounter for palliative care: Secondary | ICD-10-CM

## 2019-11-26 LAB — VARICELLA-ZOSTER BY PCR: Varicella-Zoster, PCR: NEGATIVE

## 2019-11-26 MED ORDER — LORAZEPAM 2 MG/ML IJ SOLN
0.5000 mg | INTRAMUSCULAR | Status: DC | PRN
  Filled 2019-11-26: qty 1

## 2019-11-26 MED ORDER — MORPHINE SULFATE (PF) 2 MG/ML IV SOLN: 1.0000 mg | INTRAVENOUS | Status: DC | PRN

## 2019-11-26 MED ORDER — LORAZEPAM 0.5 MG PO TABS
0.5000 mg | ORAL_TABLET | ORAL | Status: DC | PRN
  Administered 2019-11-27 – 2019-11-28 (×4): 0.5 mg via ORAL
  Filled 2019-11-26 (×4): qty 1

## 2019-11-26 MED ORDER — MORPHINE SULFATE 10 MG/5ML PO SOLN: 5.0000 mg | ORAL | Status: DC | PRN

## 2019-11-26 NOTE — Progress Notes (Signed)
Daily Progress Note   Patient Name: Wayne [name]       Date: 11/26/2019 DOB: 1930/05/29  Age: 86 y.o. MRN#: [id number] Attending Physician: Eugenie Filler, MD Primary Care Physician: Lorrene Reid, PA-C Admit Date: 11/22/2019  Reason for Consultation/Follow-up: Establishing goals of care and Terminal Care  Subjective: Patient drowsy but appears comfortable. He will open eyes to voice and answers simple questions for daughter at bedside. He is asking for a banana shake.   Required 1 dose of ativan over night.   GOC: Daughter, Almyra Free at bedside. Wife has gone home to rest. Provided update. Plan is for transfer to 6N this afternoon. Patient lost IV access. Because he is requiring minimal prn's, Almyra Free and I agreed NOT to stick him for another IV at this time. PO comfort meds added to [facility]. If necessary, RN can attempt IV if symptoms worsen. Answered questions. Still waiting on hospice bed.   Length of Stay: 3  Current Medications: Scheduled Meds:    Continuous Infusions:   PRN Meds: acetaminophen **OR** acetaminophen, antiseptic oral rinse, bisacodyl, glycopyrrolate, LORazepam, LORazepam, morphine, morphine injection, ondansetron **OR** ondansetron (ZOFRAN) IV, polyvinyl alcohol  Physical Exam Vitals and nursing note reviewed.  Constitutional:      Appearance: He is cachectic. He is ill-appearing.  HENT:     Head: Normocephalic and atraumatic.  Cardiovascular:     Heart sounds: Murmur present.  Pulmonary:     Effort: No tachypnea, accessory muscle usage or respiratory distress.     Breath sounds: Normal breath sounds.     Comments: Regular, shallow Abdominal:     Tenderness: There is no abdominal tenderness.  Skin:    General: Skin is warm and dry.     Coloration:  Skin is pale.  Neurological:     Mental Status: He is easily aroused.     Comments: Drowsy, disoriented, comfortable.             Vital Signs: BP (!) 93/53   Pulse 66   Temp 98.5 F (36.9 C) (Axillary)   Resp 16   Ht (!) 6" (0.152 m)   Wt 76.4 kg   SpO2 100%   BMI 3289.45 kg/m  SpO2: SpO2: 100 % O2 Device: O2 Device: Nasal Cannula O2 Flow Rate:    Intake/output summary:   Intake/Output Summary (  Last 24 hours) at 11/26/2019 1903 Last data filed at 11/26/2019 0758 Gross per 24 hour  Intake --  Output 350 ml  Net -350 ml   LBM: Last BM Date: (unknown) Baseline Weight: Weight: 76.4 kg Most recent weight: Weight: 76.4 kg       Palliative Assessment/Data: PPS 20%     Patient Active Problem List   Diagnosis Date Noted  . Acute metabolic encephalopathy   . Anxiety state   . AMS (altered mental status) 11/23/2019  . Mitral regurgitation 11/23/2019  . Palliative care by specialist   . Goals of care, counseling/discussion   . DNR (do not resuscitate)   . Terminal care   . Daytime somnolence 07/17/2018  . Appendicitis 07/17/2018  . Chronic anemia 07/17/2018  . Oxygen desaturation   . Abnormality of gait   . Impaired mobility and ADLs   . Acute on chronic diastolic heart failure ([place])   . Sepsis ([place]) 06/20/2018  . SIRS (systemic inflammatory response syndrome) ([place]) 06/20/2018  . Dyspnea   . Normocytic anemia   . Appendicitis with perforation 04/21/2018  . Pain in left knee 02/26/2018  . Pain in right knee 02/26/2018  . Fall 01/21/2018  . Closed fracture of left inferior pubic ramus ([place]) 01/21/2018  . Constipation 12/19/2017  .  hernia of linea alba 12/19/2017  . Osteoarthritis of hip 09/24/2017  . Gastroenteritis 09/10/2017  . S/P TAVR (transcatheter aortic valve replacement) 09/04/2017  . Severe mitral regurgitation   . Insomnia 07/10/2017  . High risk medication use 07/10/2017  . Long term current use of anticoagulant 07/10/2017  . Chronic diastolic CHF  (congestive heart failure) ([place]) 05/20/2017  . Hyponatremia 05/20/2017  . Severe aortic stenosis 05/20/2017  . Urinary retention   . 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 Spinal stenosis at L4-L5 level 04/26/2017  . Lumbar radiculopathy, chronic 04/26/2017  . Benign colonic polyp- 25 yrs ago.  11/16/2016  . Vitamin D deficiency 11/16/2016  . H/O non anemic vitamin B12 deficiency 11/16/2016  . Chronic Colonic dysmotility 11/16/2016  . Hypokalemia 09/13/2016  . BPH with obstruction/lower urinary tract symptoms 09/13/2016  . Urinary frequency 07/10/2016  . Left bundle branch block (LBBB) on electrocardiogram 06/30/2016  . History of stroke 06/30/2016  . Generalized weakness 06/30/2016  . Asthenia 06/30/2016  . Osteoarthritis 04/04/2016  . Encounter for therapeutic drug monitoring 08/06/2013  . Chronic anticoagulation   . HTN (hypertension)   . CAD (coronary artery disease)   . Chronic atrial fibrillation ([place]) 09/13/2010  . h/o CVA (cerebrovascular accident due to intracerebral hemorrhage) 09/13/2010    Palliative Care Assessment & Plan   Patient Profile: Per intake H&P --> Lou Loewe Vanderburgis a 86 y.o.malewith medical history significant forchronic atrial fibrillation on Eliquis, hypertension, aortic stenosis s/p TAVR, chronic diastolic heart failure, CAD s/p stent and BPH who presents with concerns of worsening weakness and confusion.  Palliative care was asked to see [name] in the setting of a worsening decline over the past few weeks.   Assessment: Acute Metabolic Encephalopathy Chronic atrial fibrillation Thrombocytopenia Aortic stenosis s/p TAVR Hx of moderate MR Recent shingles Poor oral intake Weakness  Recommendations/Plan:  Comfort focused care plan  DNR/DNI  Continue prn comfort meds on MAR. Lost IV access today. Daughter and I agreed not to restart IV at this time, since he is requiring minimal prns for comfort. PO  comfort meds added to [facility].   Continue comfort feeds per patient/family request. Aspiration precautions.  Unrestricted visitor access for EOL care.  Pending [facility] hospice bed. Family agreeable with transfer to 6N for comfort care if bed becomes available.   Goals of Care and Additional Recommendations:  Limitations on Scope of Treatment: Full Comfort Care  Code Status: DNR   Code Status Orders  (From admission, onward)         Start     Ordered   11/23/19 1515  Do not attempt resuscitation (DNR)  Continuous    Question Answer Comment  In the event of cardiac or respiratory ARREST Do not call a "code blue"   In the event of cardiac or respiratory ARREST Do not perform Intubation, CPR, defibrillation or ACLS   In the event of cardiac or respiratory ARREST Use medication by any route, position, wound care, and other measures to relive pain and suffering. May use oxygen, suction and manual treatment of airway obstruction as needed for comfort.   Comments Patient and wife in agreement, states DNR has been signed.  Has been DNR in past.      11/23/19 1517        Code Status History    Date Active Date Inactive Code Status Order ID Comments User Context   11/23/2019 0022 11/23/2019 1517 DNR [id number]  Orene Desanctis, DO ED   06/20/2018 1620 06/28/2018 1821 DNR [id number]  Cleophas Dunker, DO ED   04/21/2018 1547 04/26/2018 1828 DNR [id number]  Kathrene Alu, MD ED   01/22/2018 0609 01/28/2018 2308 DNR [id number]  Ivor Costa, MD Inpatient   01/22/2018 0059 01/22/2018 0609 Full Code [id number]  Ivor Costa, MD ED   09/10/2017 1952 09/15/2017 1657 Full Code [id number]  Quintella Baton, MD ED   09/04/2017 1214 09/06/2017 1546 Full Code [id number]  Gaye Pollack, MD Inpatient   08/01/2017 1248 08/01/2017 2041 Full Code [id number]  Sherren Mocha, MD Inpatient   05/20/2017 2331 05/23/2017 1706 Full Code [id number]  Jani Gravel, MD Inpatient   06/30/2016 2035 07/04/2016 1535 Full Code [id number]  Rama,  Venetia Maxon, MD Inpatient   Advance Care Planning Activity       Prognosis:   Poor prognosis: less than two weeks   Discharge Planning:  Hospice facility   Care plan was discussed with Dr. Grandville Silos, RN, daughter  Thank you for allowing the Palliative Medicine Team to assist in the care of this patient.   Time In: 1400 Time Out: 1415 Total Time 15 Prolonged Time Billed no      Greater than 50% of this time was spent counseling and coordinating care related to the above assessment and plan.   Ihor Dow, DNP, FNP-C Palliative Medicine Team  Phone: [phone] Fax: [phone]  Please contact Palliative Medicine Team phone at [phone] for questions and concerns.

## 2019-11-26 NOTE — Progress Notes (Signed)
PROGRESS NOTE    Wayne [name]  VQM:[id number] DOB: October 05, 1929 DOA: 11/22/2019 PCP: Lorrene Reid, PA-C    Chief Complaint  Patient presents with  . Altered Mental Status  . Fall    Brief Narrative:  Wayne [name] a 86 y.o.malewith medical history significant forchronic atrial fibrillation on Eliquis, hypertension, aortic stenosis s/p TAVR, chronic diastolic heart failure, CAD s/p stent and BPH who presents with concerns of worsening weakness and confusion.  Wife at bedside provides history as patient has altered mental status. She reports that about 3 weeks ago he was diagnosed with shingles to his left eyeand scalp and was placed on 10 days of antiviral. Since then he has had issues with feeling nauseous and at one point last week hehad a fast heart rate as well as an "attack" where he would shake and per wife is due toanxiety. EMS was called to the house to evaluate him but reportedly hehad normal vitals and patient did not want to be transported. Then yesterday patient suffered a fall and today was soweak that he could not get up from bed. Wife denies him complaining of any pain including headache, neck pain, or chest pain. Patient noted to have labored breathing in ED but wife says that happens occasionally and is not new. He was alert and oriented to self and his wife but did not know where he was or why he is here. Wife says this is not his baseline. Last Eliquis was this morning.  Of note, patient saw cardiology 3 days ago for symptoms of nausea and chest painthought to be arrhthymias vs anxietyand was placed on a Zio patch and had a planned echo.  He was admitted with worsening weakness and confusion following recent treatment for herpes zoster ophthalmicus.  After discussion with palliative care, plan for comfort measures and inpatient hospice.   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  Patient completed course of therapy.  It was noted that patient's daughter noted at his appointment patient had required wheelchair with his head down and was significantly weak/lethargic.  Patient noted to have had progressive weakness and confusion since shingles infection.  It was noted that patient skin lesions had crusted with dry and no longer needed to be under airborne precautions and as such airborne precautions was discontinued after discussion with ID.  Patient seen by palliative care and decision has been made to transition to full comfort measures.  Supportive care.  2.  Hypokalemia Repleted.  Patient now on full comfort measures.  No further labs ordered.  3.  Thrombocytopenia Felt reactive.  Patient now full comfort measures.  4.  Chronic atrial fibrillation Patient was initially placed on home regimen of digoxin, metoprolol and Lasix when able to tolerate oral intake.  Subsequently transition to full comfort measures and as such  medications discontinued per palliative care.  5.  Aortic stenosis status post TAVR/moderate mitral regurgitation Per 2D echo from 11/05/2018 normal valve function post TAVR.  Patient now has been transitioned to full comfort measures.  6.  Hypertension Patient has been transitioned to full  comfort measures and as such patient's antihypertensive of Metroprolol Lasix have been discontinued per palliative care.  7.  BPH Finasteride and alfuzosin when able to tolerate oral intake.  Currently under comfort measures.   DVT prophylaxis: On comfort measures Code Status: DNR Family Communication: Updated daughter at bedside. Disposition:   Status is: Inpatient    Dispo: The patient is from: Home              Anticipated d/c is to: Residential hospice home              Anticipated d/c date is: To be determined.              Patient currently for comfort measures awaiting residential hospice home.       Consultants:   Palliative care: Tacey Ruiz, NP 11/23/2019  Procedures:   CT head 11/22/2019  Chest x-ray 11/22/2019  Antimicrobials:   IV acyclovir 6/6/2021x2 doses     Subjective: Patient sleeping but arousable.  Dry mucous membranes.  Minimal oral intake per RN.  Patient denies any chest pain.  No shortness of breath.  Daughter at bedside feels patient is being kept comfortable.  Objective: Vitals:   11/24/19 0046 11/24/19 0822 11/24/19 2210 11/26/19 0003  BP: (!) 106/55 125/77 (!) 104/56 (!) 93/53  Pulse: 94 91 95 66  Resp:   16   Temp: 98.3 F (36.8 C) 98.5 F (36.9 C) 98 F (36.7 C) 98.5 F (36.9 C)  TempSrc: Axillary Oral Axillary Axillary  SpO2: 98% 99% 100% 100%  Weight:      Height:        Intake/Output Summary (Last 24 hours) at 11/26/2019 1839 Last data filed at 11/26/2019 0758 Gross per 24 hour  Intake --  Output 350 ml  Net -350 ml   Filed Weights   11/23/19 0231  Weight: 76.4 kg    Examination:  General exam: Appears calm and comfortable.  Dry mucous membranes Respiratory system: Clear to auscultation. Respiratory effort normal. Cardiovascular system: Regular rate rhythm with grade 3/6 systolic ejection murmur.  No lower extremity edema.   Gastrointestinal system: Abdomen is nondistended, soft and nontender. No organomegaly  or masses felt. Normal bowel sounds heard. Central nervous system: Alert and oriented. No focal neurological deficits. Extremities: Symmetric 5 x 5 power. Skin: No rashes, lesions or ulcers Psychiatry: Judgement and insight appear fair. Mood & affect appropriate.     Data Reviewed: I have personally reviewed following labs and imaging studies  CBC: Recent Labs  Lab 11/22/19 1947 11/23/19 0404  WBC 8.5 8.7  NEUTROABS 6.6  --   HGB 14.2 14.6  HCT 43.5 44.0  MCV 93.1 91.9  PLT 97* 91*    Basic Metabolic Panel: Recent Labs  Lab 11/22/19 1947 11/23/19 0404  NA 139 141  K 3.1* 3.0*  CL 105 103  CO2 22 24  GLUCOSE 125* 113*  BUN 17 16  CREATININE 1.10 1.12  CALCIUM 8.4* 8.7*  MG  --  2.2    GFR: CrCl cannot be calculated (Unknown ideal weight.).  Liver Function Tests: Recent Labs  Lab 11/22/19 1947  AST 23  ALT 16  ALKPHOS 32*  BILITOT 1.1  PROT 6.4*  ALBUMIN 3.5  CBG: No results for input(s): GLUCAP in the last 168 hours.   Recent Results (from the past 240 hour(s))  SARS Coronavirus 2 by RT PCR (hospital order, performed in [facility] hospital lab) Nasopharyngeal Nasopharyngeal Swab     Status: None   Collection Time: 11/22/19 10:32 PM   Specimen: Nasopharyngeal Swab  Result Value Ref Range Status   SARS Coronavirus 2 NEGATIVE NEGATIVE Final    Comment: (NOTE) SARS-CoV-2 target nucleic acids are NOT DETECTED. The SARS-CoV-2 RNA is generally detectable in upper and lower respiratory specimens during the acute phase of infection. The lowest concentration of SARS-CoV-2 viral copies this assay can detect is 250 copies / mL. A negative result does not preclude SARS-CoV-2 infection and should not be used as the sole basis for treatment or other patient management decisions.  A negative result may occur with improper specimen collection / handling, submission of specimen other than nasopharyngeal swab, presence of viral mutation(s) within the areas  targeted by this assay, and inadequate number of viral copies (<250 copies / mL). A negative result must be combined with clinical observations, patient history, and epidemiological information. Fact Sheet for Patients:   StrictlyIdeas.no Fact Sheet for Healthcare Providers: BankingDealers.co.za This test is not yet approved or cleared  by the [place] FDA and has been authorized for detection and/or diagnosis of SARS-CoV-2 by FDA under an Emergency Use Authorization (EUA).  This EUA will remain in effect (meaning this test can be used) for the duration of the COVID-19 declaration under Section 564(b)(1) of the Act, 21 U.S.C. section 360bbb-3(b)(1), unless the authorization is terminated or revoked sooner. Performed at [facility], [place] [address] [place], [place] [address]   Culture, blood (routine x 2)     Status: None (Preliminary result)   Collection Time: 11/23/19  2:33 PM   Specimen: BLOOD RIGHT HAND  Result Value Ref Range Status   Specimen Description BLOOD RIGHT HAND  Final   Special Requests   Final    BOTTLES DRAWN AEROBIC AND ANAEROBIC Blood Culture adequate volume   Culture   Final    NO GROWTH 3 DAYS Performed at [facility], [place] [address], [place], [place] [address]    Report Status PENDING  Incomplete  Culture, blood (routine x 2)     Status: None (Preliminary result)   Collection Time: 11/23/19  2:37 PM   Specimen: BLOOD LEFT HAND  Result Value Ref Range Status   Specimen Description BLOOD LEFT HAND  Final   Special Requests   Final    BOTTLES DRAWN AEROBIC AND ANAEROBIC Blood Culture adequate volume   Culture   Final    NO GROWTH 3 DAYS Performed at [facility], [place] [address] [place], [place] [address]    Report Status PENDING  Incomplete         Radiology Studies: No results found.      Scheduled Meds: Continuous Infusions:   LOS: 3 days    Time spent: 35  minutes    Irine Seal, MD [facility]   To contact the attending provider between 7A-7P or the covering provider during after hours 7P-7A, please log into the web site [email or web address] and access using universal [place] password for that web site. If you do not have the password, please call the hospital operator.  11/26/2019, 6:39 PM

## 2019-11-26 NOTE — Care Management Important Message (Signed)
Important Message  Patient Details  Name: Wayne [name] MRN: [id number] Date of Birth: Dec 30, 1929   Medicare Important Message Given:  Yes     Tex Conroy 11/26/2019, 1:40 PM

## 2019-11-26 NOTE — Plan of Care (Signed)
progressing 

## 2019-11-27 MED ORDER — ATROPINE SULFATE 1 % OP SOLN
2.0000 [drp] | Freq: Four times a day (QID) | OPHTHALMIC | Status: DC | PRN
  Filled 2019-11-27: qty 2

## 2019-11-27 MED ORDER — FINASTERIDE 5 MG PO TABS
5.0000 mg | ORAL_TABLET | Freq: Every day | ORAL | Status: DC
  Administered 2019-11-27: 5 mg via ORAL
  Filled 2019-11-27: qty 1

## 2019-11-27 MED ORDER — MORPHINE SULFATE (CONCENTRATE) 10 MG/0.5ML PO SOLN
10.0000 mg | ORAL | Status: DC | PRN
  Administered 2019-11-27 – 2019-11-28 (×2): 10 mg via SUBLINGUAL
  Filled 2019-11-27 (×2): qty 0.5

## 2019-11-27 MED ORDER — ALFUZOSIN HCL ER 10 MG PO TB24
10.0000 mg | ORAL_TABLET | Freq: Every day | ORAL | Status: DC
  Administered 2019-11-28: 10 mg via ORAL
  Filled 2019-11-27 (×2): qty 1

## 2019-11-27 NOTE — Progress Notes (Signed)
PROGRESS NOTE    Wayne [name]  FYB:[id number] DOB: 1929-08-18 DOA: 11/22/2019 PCP: Lorrene Reid, PA-C    Chief Complaint  Patient presents with  . Altered Mental Status  . Fall    Brief Narrative:  Wayne [name] a 86 y.o.malewith medical history significant forchronic atrial fibrillation on Eliquis, hypertension, aortic stenosis s/p TAVR, chronic diastolic heart failure, CAD s/p stent and BPH who presents with concerns of worsening weakness and confusion.  Wife at bedside provides history as patient has altered mental status. She reports that about 3 weeks ago he was diagnosed with shingles to his left eyeand scalp and was placed on 10 days of antiviral. Since then he has had issues with feeling nauseous and at one point last week hehad a fast heart rate as well as an "attack" where he would shake and per wife is due toanxiety. EMS was called to the house to evaluate him but reportedly hehad normal vitals and patient did not want to be transported. Then yesterday patient suffered a fall and today was soweak that he could not get up from bed. Wife denies him complaining of any pain including headache, neck pain, or chest pain. Patient noted to have labored breathing in ED but wife says that happens occasionally and is not new. He was alert and oriented to self and his wife but did not know where he was or why he is here. Wife says this is not his baseline. Last Eliquis was this morning.  Of note, patient saw cardiology 3 days ago for symptoms of nausea and chest painthought to be arrhthymias vs anxietyand was placed on a Zio patch and had a planned echo.  He was admitted with worsening weakness and confusion following recent treatment for herpes zoster ophthalmicus.  After discussion with palliative care, plan for comfort measures and inpatient hospice.   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  Patient completed course of therapy.  It was noted that patient's daughter noted at his appointment patient had required wheelchair with his head down and was significantly weak/lethargic.  Patient noted to have had progressive weakness and confusion since shingles infection.  It was noted that patient skin lesions had crusted with dry and no longer needed to be under airborne precautions and as such airborne precautions was discontinued after discussion with ID.  Patient seen by palliative care and decision has been made to transition to full comfort measures.  Supportive care.  2.  Hypokalemia Repleted.  Patient now on full comfort measures.  No further labs ordered.  3.  Thrombocytopenia Felt reactive.  Patient now full comfort measures.  4.  Chronic atrial fibrillation Patient was initially placed on home regimen of digoxin, metoprolol and Lasix when able to tolerate oral intake.  Subsequently transitioned to full comfort measures and as such  medications discontinued per palliative care.  5.  Aortic stenosis status post TAVR/moderate mitral regurgitation Per 2D echo from 11/05/2018 normal valve function post TAVR.  Patient now has been transitioned to full comfort measures.  6.  Hypertension Patient has been transitioned to  full comfort measures and as such patient's antihypertensive medications of Metroprolol Lasix have been discontinued per palliative care.  7.  BPH We will resume home regimen of finasteride and alfuzosin. Currently under comfort measures.   DVT prophylaxis: On comfort measures Code Status: DNR Family Communication: Updated wife at bedside. Disposition:   Status is: Inpatient    Dispo: The patient is from: Home              Anticipated d/c is to: Residential hospice home              Anticipated d/c date is: When bed available at residential hospice home.              Patient currently transition to full comfort measures and awaiting placement to residential hospice home.        Consultants:   Palliative care: Tacey Ruiz, NP 11/23/2019  Procedures:   CT head 11/22/2019  Chest x-ray 11/22/2019  Antimicrobials:   IV acyclovir 6/6/2021x2 doses     Subjective: Patient sleeping but arousable.  Drowsy.  Dry mucous membranes.  Denies any chest pain or shortness of breath.  Wife at bedside.  Wife feels he has been kept comfortable.  Wife somewhat tearful.    Objective: Vitals:   11/24/19 0822 11/24/19 2210 11/26/19 0003 11/27/19 0624  BP: 125/77 (!) 104/56 (!) 93/53 (!) 96/58  Pulse: 91 95 66 84  Resp:  16  17  Temp: 98.5 F (36.9 C) 98 F (36.7 C) 98.5 F (36.9 C) 98.1 F (36.7 C)  TempSrc: Oral Axillary Axillary Oral  SpO2: 99% 100% 100% 96%  Weight:      Height:        Intake/Output Summary (Last 24 hours) at 11/27/2019 1237 Last data filed at 11/27/2019 1000 Gross per 24 hour  Intake 0 ml  Output 750 ml  Net -750 ml   Filed Weights   11/23/19 0231  Weight: 76.4 kg    Examination:  General exam: Drowsy.  Dry mucous membranes Respiratory system: CTA B.  No wheezes, no crackles, no rhonchi.  Normal respiratory effort.  Cardiovascular system: RRR with grade 3/6 systolic ejection murmur.  No lower extremity edema.   Gastrointestinal system: Abdomen  is soft, nontender, nondistended, positive bowel sounds.  No rebound.  No guarding.  Central nervous system: Alert and oriented. No focal neurological deficits. Extremities: Symmetric 5 x 5 power. Skin: No rashes, lesions or ulcers Psychiatry: Judgement and insight appear fair. Mood & affect appropriate.     Data Reviewed: I have personally reviewed following labs and imaging studies  CBC: Recent Labs  Lab 11/22/19 1947 11/23/19 0404  WBC 8.5 8.7  NEUTROABS 6.6  --   HGB 14.2 14.6  HCT 43.5 44.0  MCV 93.1 91.9  PLT 97* 91*    Basic Metabolic Panel: Recent Labs  Lab 11/22/19 1947 11/23/19 0404  NA 139 141  K 3.1* 3.0*  CL 105 103  CO2 22 24  GLUCOSE 125* 113*  BUN 17 16  CREATININE 1.10 1.12  CALCIUM 8.4* 8.7*  MG  --  2.2    GFR: CrCl cannot be calculated (Unknown ideal weight.).  Liver Function Tests: Recent Labs  Lab 11/22/19 1947  AST  23  ALT 16  ALKPHOS 32*  BILITOT 1.1  PROT 6.4*  ALBUMIN 3.5    CBG: No results for input(s): GLUCAP in the last 168 hours.   Recent Results (from the past 240 hour(s))  SARS Coronavirus 2 by RT PCR (hospital order, performed in [facility] hospital lab) Nasopharyngeal Nasopharyngeal Swab     Status: None   Collection Time: 11/22/19 10:32 PM   Specimen: Nasopharyngeal Swab  Result Value Ref Range Status   SARS Coronavirus 2 NEGATIVE NEGATIVE Final    Comment: (NOTE) SARS-CoV-2 target nucleic acids are NOT DETECTED. The SARS-CoV-2 RNA is generally detectable in upper and lower respiratory specimens during the acute phase of infection. The lowest concentration of SARS-CoV-2 viral copies this assay can detect is 250 copies / mL. A negative result does not preclude SARS-CoV-2 infection and should not be used as the sole basis for treatment or other patient management decisions.  A negative result may occur with improper specimen collection / handling, submission of specimen other than nasopharyngeal swab, presence  of viral mutation(s) within the areas targeted by this assay, and inadequate number of viral copies (<250 copies / mL). A negative result must be combined with clinical observations, patient history, and epidemiological information. Fact Sheet for Patients:   StrictlyIdeas.no Fact Sheet for Healthcare Providers: BankingDealers.co.za This test is not yet approved or cleared  by the [place] FDA and has been authorized for detection and/or diagnosis of SARS-CoV-2 by FDA under an Emergency Use Authorization (EUA).  This EUA will remain in effect (meaning this test can be used) for the duration of the COVID-19 declaration under Section 564(b)(1) of the Act, 21 U.S.C. section 360bbb-3(b)(1), unless the authorization is terminated or revoked sooner. Performed at [facility], [place] [address] [place], [place] [address]   Culture, blood (routine x 2)     Status: None (Preliminary result)   Collection Time: 11/23/19  2:33 PM   Specimen: BLOOD RIGHT HAND  Result Value Ref Range Status   Specimen Description BLOOD RIGHT HAND  Final   Special Requests   Final    BOTTLES DRAWN AEROBIC AND ANAEROBIC Blood Culture adequate volume   Culture   Final    NO GROWTH 4 DAYS Performed at [facility], [place] [address] [place], [place] [address]    Report Status PENDING  Incomplete  Culture, blood (routine x 2)     Status: None (Preliminary result)   Collection Time: 11/23/19  2:37 PM   Specimen: BLOOD LEFT HAND  Result Value Ref Range Status   Specimen Description BLOOD LEFT HAND  Final   Special Requests   Final    BOTTLES DRAWN AEROBIC AND ANAEROBIC Blood Culture adequate volume   Culture   Final    NO GROWTH 4 DAYS Performed at [facility], [place] [address] [place], [place] [address]    Report Status PENDING  Incomplete         Radiology Studies: No results found.      Scheduled Meds: Continuous Infusions:    LOS: 4 days    Time spent: 35 minutes    Irine Seal, MD [facility]   To contact the attending provider between 7A-7P or the covering provider during after hours 7P-7A, please log into the web site [email or web address] and access using universal  password for that web site. If you do not have the password, please call the hospital operator.  11/27/2019, 12:37 PM

## 2019-11-27 NOTE — Progress Notes (Addendum)
Daily Progress Note   Patient Name: Wayne [name]       Date: 11/27/2019 DOB: Jan 14, 1930  Age: 86 y.o. MRN#: [id number] Attending Physician: Eugenie Filler, MD Primary Care Physician: Lorrene Reid, PA-C Admit Date: 11/22/2019  Reason for Consultation/Follow-up: Establishing goals of care and Terminal Care  Subjective: Patient sleeping. He appears comfortable without s/s of distress. He required PO ativan x2 on night shift. Comfortable during visit. Patient does have intermittent periods of lucidity but sleeping majority of the day. Accepting few bites and sips for family.   GOC: Wife at bedside. Updated on care plan and medications.    Spoke with Michiel Cowboy, [place] waiting on [facility] bed.   Spoke with daughter, Molly Maduro regarding care plan, comfort care, medications, and continued comfort care in the hospital until hospice bed is available. Answered questions to the best of my ability.   Length of Stay: 4  Current Medications: Scheduled Meds:    Continuous Infusions:   PRN Meds: acetaminophen **OR** acetaminophen, antiseptic oral rinse, atropine, bisacodyl, glycopyrrolate, LORazepam, LORazepam, morphine injection, morphine CONCENTRATE, ondansetron **OR** ondansetron (ZOFRAN) IV, polyvinyl alcohol  Physical Exam Vitals and nursing note reviewed.  Constitutional:      Appearance: He is cachectic. He is ill-appearing.  HENT:     Head: Normocephalic and atraumatic.  Cardiovascular:     Heart sounds: Murmur heard.   Pulmonary:     Effort: No tachypnea, accessory muscle usage or respiratory distress.     Breath sounds: Normal breath sounds.     Comments: Regular, shallow Abdominal:     Tenderness: There is no abdominal tenderness.  Skin:    General: Skin  is warm and dry.     Coloration: Skin is pale.  Neurological:     Mental Status: He is easily aroused.     Comments: Drowsy, disoriented, comfortable.             Vital Signs: BP (!) 96/58 (BP Location: Right Arm)   Pulse 84   Temp 98.1 F (36.7 C) (Oral)   Resp 17   Ht (!) 6" (0.152 m)   Wt 76.4 kg   SpO2 96%   BMI 3289.45 kg/m  SpO2: SpO2: 96 % O2 Device: O2 Device: Room Air O2 Flow Rate:    Intake/output summary:   Intake/Output  Summary (Last 24 hours) at 11/27/2019 1558 Last data filed at 11/27/2019 1300 Gross per 24 hour  Intake 0 ml  Output 750 ml  Net -750 ml   LBM: Last BM Date:  (unknown) Baseline Weight: Weight: 76.4 kg Most recent weight: Weight: 76.4 kg       Palliative Assessment/Data: PPS 20%     Patient Active Problem List   Diagnosis Date Noted  . Acute metabolic encephalopathy   . Anxiety state   . AMS (altered mental status) 11/23/2019  . Mitral regurgitation 11/23/2019  . Palliative care by specialist   . Goals of care, counseling/discussion   . DNR (do not resuscitate)   . Terminal care   . Daytime somnolence 07/17/2018  . Appendicitis 07/17/2018  . Chronic anemia 07/17/2018  . Oxygen desaturation   . Abnormality of gait   . Impaired mobility and ADLs   . Acute on chronic diastolic heart failure ([place])   . Sepsis ([employer]) 06/20/2018  . SIRS (systemic inflammatory response syndrome) ([place]) 06/20/2018  . Dyspnea   . Normocytic anemia   . Appendicitis with perforation 04/21/2018  . Pain in left knee 02/26/2018  . Pain in right knee 02/26/2018  . Fall 01/21/2018  . Closed fracture of left inferior pubic ramus ([place]) 01/21/2018  . Constipation 12/19/2017  .  hernia of linea alba 12/19/2017  . Osteoarthritis of hip 09/24/2017  . Gastroenteritis 09/10/2017  . S/P TAVR (transcatheter aortic valve replacement) 09/04/2017  . Severe mitral regurgitation   . Insomnia 07/10/2017  . High risk medication use 07/10/2017  . Long term current use of  anticoagulant 07/10/2017  . Chronic diastolic CHF (congestive heart failure) ([place]) 05/20/2017  . Hyponatremia 05/20/2017  . Severe aortic stenosis 05/20/2017  . Urinary retention   . 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 Spinal stenosis at L4-L5 level 04/26/2017  . Lumbar radiculopathy, chronic 04/26/2017  . Benign colonic polyp- 25 yrs ago.  11/16/2016  . Vitamin D deficiency 11/16/2016  . H/O non anemic vitamin B12 deficiency 11/16/2016  . Chronic Colonic dysmotility 11/16/2016  . Hypokalemia 09/13/2016  . BPH with obstruction/lower urinary tract symptoms 09/13/2016  . Urinary frequency 07/10/2016  . Left bundle branch block (LBBB) on electrocardiogram 06/30/2016  . History of stroke 06/30/2016  . Generalized weakness 06/30/2016  . Asthenia 06/30/2016  . Osteoarthritis 04/04/2016  . Encounter for therapeutic drug monitoring 08/06/2013  . Chronic anticoagulation   . HTN (hypertension)   . CAD (coronary artery disease)   . Chronic atrial fibrillation ([place]) 09/13/2010  . h/o CVA (cerebrovascular accident due to intracerebral hemorrhage) 09/13/2010    Palliative Care Assessment & Plan   Patient Profile: Per intake H&P --> Wayne [name] a 86 y.o.malewith medical history significant forchronic atrial fibrillation on Eliquis, hypertension, aortic stenosis s/p TAVR, chronic diastolic heart failure, CAD s/p stent and BPH who presents with concerns of worsening weakness and confusion.  Palliative care was asked to see Wayne [name] in the setting of a worsening decline over the past few weeks.   Assessment: Acute Metabolic Encephalopathy Chronic atrial fibrillation Thrombocytopenia Aortic stenosis s/p TAVR Hx of moderate MR Recent shingles Poor oral intake Weakness  Recommendations/Plan:  Comfort focused care plan  DNR/DNI  Continue prn comfort meds on MAR. Lost IV access 6/9. Daughter and I agreed not to restart IV at this time, since  he is requiring minimal prns for comfort. PO comfort meds added to [facility]. Consider IV placement if symptom burden becomes worse.  Continue comfort feeds per patient/family request. Aspiration precautions.   Unrestricted visitor access for EOL care.   Pending [facility] hospice bed. Continue comfort care on 6N.  Goals of Care and Additional Recommendations:  Limitations on Scope of Treatment: Full Comfort Care  Code Status: DNR   Code Status Orders  (From admission, onward)         Start     Ordered   11/23/19 1515  Do not attempt resuscitation (DNR)  Continuous    Question Answer Comment  In the event of cardiac or respiratory ARREST Do not call a "code blue"   In the event of cardiac or respiratory ARREST Do not perform Intubation, CPR, defibrillation or ACLS   In the event of cardiac or respiratory ARREST Use medication by any route, position, wound care, and other measures to relive pain and suffering. May use oxygen, suction and manual treatment of airway obstruction as needed for comfort.   Comments Patient and wife in agreement, states DNR has been signed.  Has been DNR in past.      11/23/19 1517        Code Status History    Date Active Date Inactive Code Status Order ID Comments User Context   11/23/2019 0022 11/23/2019 1517 DNR [id number]  Orene Desanctis, DO ED   06/20/2018 1620 06/28/2018 1821 DNR [id number]  Cleophas Dunker, DO ED   04/21/2018 1547 04/26/2018 1828 DNR [id number]  Kathrene Alu, MD ED   01/22/2018 0609 01/28/2018 2308 DNR [id number]  Ivor Costa, MD Inpatient   01/22/2018 0059 01/22/2018 0609 Full Code [id number]  Ivor Costa, MD ED   09/10/2017 1952 09/15/2017 1657 Full Code [id number]  Quintella Baton, MD ED   09/04/2017 1214 09/06/2017 1546 Full Code [id number]  Gaye Pollack, MD Inpatient   08/01/2017 1248 08/01/2017 2041 Full Code [id number]  Sherren Mocha, MD Inpatient   05/20/2017 2331 05/23/2017 1706 Full Code [id number]  Jani Gravel, MD Inpatient   06/30/2016  2035 07/04/2016 1535 Full Code [id number]  Rama, Venetia Maxon, MD Inpatient   Advance Care Planning Activity       Prognosis:   Poor prognosis: less than two weeks   Discharge Planning:  Hospice facility   Care plan was discussed with SW, daughter, wife  Thank you for allowing the Palliative Medicine Team to assist in the care of this patient.   Time In: 1000- Time Out: 1015 Total Time 15 Prolonged Time Billed no    Greater than 50% of this time was spent counseling and coordinating care related to the above assessment and plan.   Ihor Dow, DNP, FNP-C Palliative Medicine Team  Phone: [phone] Fax: [phone]  Please contact Palliative Medicine Team phone at [phone] for questions and concerns.

## 2019-11-27 NOTE — TOC Progression Note (Signed)
Transition of Care [facility]) - Progression Note    Patient Details  Name: Wayne [name] MRN: [id number] Date of Birth: 23-Mar-1930  [facility] CM/SW [place], [place] Phone Number: 11/27/2019, 11:21 AM  Clinical Narrative:    CSW spoke with Bevely Palmer, Standard liaison, no bed available at [facility] today. Plan for transition when bed available. Per PMT NP Jinny Blossom, family has been offered and is not interested in alternate hospice home placement at this time.    Expected Discharge Plan: [place] Barriers to Discharge: Hospice Bed not available  Expected Discharge Plan and Services Expected Discharge Plan: [place]  Readmission Risk Interventions No flowsheet data found.

## 2019-11-28 LAB — CULTURE, BLOOD (ROUTINE X 2)
Culture: NO GROWTH
Culture: NO GROWTH
Special Requests: ADEQUATE
Special Requests: ADEQUATE

## 2019-11-28 MED ORDER — MORPHINE SULFATE (CONCENTRATE) 10 MG/0.5ML PO SOLN: 10.0000 mg | ORAL | 0 refills | Status: AC | PRN

## 2019-11-28 MED ORDER — ATROPINE SULFATE 1 % OP SOLN: 2.0000 [drp] | Freq: Four times a day (QID) | OPHTHALMIC | 0 refills | Status: AC | PRN

## 2019-11-28 MED ORDER — LORAZEPAM 0.5 MG PO TABS: 0.5000 mg | ORAL_TABLET | ORAL | 0 refills | Status: AC | PRN

## 2019-11-28 MED ORDER — ONDANSETRON 4 MG PO TBDP: 4.0000 mg | ORAL_TABLET | Freq: Four times a day (QID) | ORAL | 0 refills | Status: AC | PRN

## 2019-11-28 NOTE — Plan of Care (Signed)
  Problem: Education: Goal: Knowledge of General Education information will improve Description: Including pain rating scale, medication(s)/side effects and non-pharmacologic comfort measures Outcome: Adequate for Discharge   Problem: Health Behavior/Discharge Planning: Goal: Ability to manage health-related needs will improve Outcome: Adequate for Discharge   Problem: Clinical Measurements: Goal: Ability to maintain clinical measurements within normal limits will improve Outcome: Adequate for Discharge Goal: Will remain free from infection Outcome: Adequate for Discharge Goal: Cardiovascular complication will be avoided Outcome: Adequate for Discharge   Problem: Activity: Goal: Risk for activity intolerance will decrease Outcome: Adequate for Discharge   Problem: Nutrition: Goal: Adequate nutrition will be maintained Outcome: Adequate for Discharge   Problem: Coping: Goal: Level of anxiety will decrease Outcome: Adequate for Discharge   Problem: Elimination: Goal: Will not experience complications related to bowel motility Outcome: Adequate for Discharge Goal: Will not experience complications related to urinary retention Outcome: Adequate for Discharge   Problem: Pain Managment: Goal: General experience of comfort will improve Outcome: Adequate for Discharge   Problem: Safety: Goal: Ability to remain free from injury will improve Outcome: Adequate for Discharge   Problem: Skin Integrity: Goal: Risk for impaired skin integrity will decrease Outcome: Adequate for Discharge   

## 2019-11-28 NOTE — Progress Notes (Signed)
Nutrition Brief Note  Chart reviewed. Pt now transitioning to comfort care.  No further nutrition interventions warranted at this time.  Please re-consult as needed.   Lemon Sternberg W, RD, LDN, CDCES Registered Dietitian II Certified Diabetes Care and Education Specialist Please refer to AMION for RD and/or RD on-call/weekend/after hours pager  

## 2019-11-28 NOTE — Discharge Summary (Signed)
Physician Discharge Summary  Wayne [name] RDE:[id number] DOB: [birth year] DOA: 11/22/2019  PCP: Lorrene Reid, PA-C  Admit date: 11/22/2019 Discharge date: 11/28/2019  Time spent: 50 minutes  Recommendations for Outpatient Follow-up:  1. Patient will be discharged to residential hospice home at [facility].  Patient will follow up with MD at [facility].   Discharge Diagnoses:  Principal Problem:   AMS (altered mental status) Active Problems:   Chronic atrial fibrillation (HCC)   HTN (hypertension)   Hypokalemia   BPH with obstruction/lower urinary tract symptoms   S/P TAVR (transcatheter aortic valve replacement)   Mitral regurgitation   Palliative care by specialist   Goals of care, counseling/discussion   DNR (do not resuscitate)   Terminal care   Anxiety state   Acute metabolic encephalopathy   Discharge Condition: Stable  Diet recommendation: Regular/comfort feeds  Filed Weights   11/23/19 0231  Weight: 76.4 kg    History of present illness:  HPI per Dr. Bernadette Hoit Wayne [name] is a 86 y.o. male with medical history significant for chronic atrial fibrillation on Eliquis, hypertension, aortic stenosis s/p TAVR, chronic diastolic heart failure, CAD s/p stent and BPH who presented with concerns of worsening weakness and confusion.  Wife at bedside provided history as patient had altered mental status.  She reported that about 3 weeks ago he was diagnosed with shingles to his left eye and scalp and was placed on 10 days of antiviral.  Since then he has had issues with feeling nauseous and at one point last week he had a fast heart rate as well as an "attack" where he would shake and per wife is due to anxiety.  EMS was called to the house to evaluate him but reportedly he had normal vitals and patient did not want to be transported.  Then the day prior to admission, patient suffered a fall and on day of admission, was so weak that he could not get up from bed.  Wife denied him complaining of any pain including headache, neck pain, or chest pain. Patient noted to have labored breathing in ED but wife stated that happens occasionally and is not new. He was alert and oriented to self and his wife but did not know where he was or why he is here. Wife stated this was not his baseline. Last Eliquis was this morning.  Of note, patient saw cardiology 3 days ago for symptoms of nausea and chest pain thought to be arrhthymias vs anxiety and was placed on a Zio patch and had a planned echo.   ED Course: He was afebrile but tachycardic and tachypneic and placed on 2 L via nasal cannula.  No leukocytosis. Plt of 97. K of 3.1. Glucose of 125. Normal creatinine of 1.10. Troponin of 33.EKG shows atrial fibrillation with no ST or T wave changes.   CT head negative.   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  Patient completed course of therapy.  It was noted that patient's daughter noted at his appointment patient had required wheelchair with his head down and was significantly weak/lethargic.  Patient noted to have had progressive weakness and confusion since shingles infection.  It was noted that patient skin lesions had crusted with dry and no longer needed to be  under airborne precautions and as such airborne precautions was discontinued after discussion with ID.  Patient seen by palliative care and decision has been made to transition to full comfort measures.    Patient be discharged to residential hospice home at [facility].    2.  Hypokalemia Repleted.  Patient now on full comfort measures.  No further labs ordered.  3.  Thrombocytopenia Felt reactive.  Patient now full  comfort measures.  4.  Chronic atrial fibrillation Patient was initially placed on home regimen of digoxin, metoprolol and Lasix when able to tolerate oral intake.  Subsequently transitioned to full comfort measures and as such medications discontinued per palliative care.  Patient's metoprolol and digoxin will be resumed on discharge for rate control.  Patient's anticoagulation has been discontinued.  Patient will be discharged to residential hospice home.  5.  Aortic stenosis status post TAVR/moderate mitral regurgitation Per 2D echo from 11/05/2018 normal valve function post TAVR.  Patient now has been transitioned to full comfort measures.  6.  Hypertension Patient has been transitioned to full comfort measures and as such patient's antihypertensive medications of Metroprolol Lasix have been discontinued per palliative care.  Patient's metoprolol will be resumed on discharge.  Lasix discontinued.  Patient has been transitioned to comfort measures and will be discharged to a residential hospice home.  7.  BPH Home regimen of finasteride and alfuzosin was resumed after being held initially during the hospitalization.  Patient has been transitioned to comfort measures.   Procedures:  CT head 11/22/2019  Chest x-ray 11/22/2019  Consultations:  Palliative care: Tacey Ruiz, NP 11/23/2019  Discharge Exam: Vitals:   11/27/19 0624 11/28/19 0630  BP: (!) 96/58 115/76  Pulse: 84 95  Resp: 17 16  Temp: 98.1 F (36.7 C) 98.1 F (36.7 C)  SpO2: 96% 97%    General: Drowsy.  Dry mucous membranes. Cardiovascular: Regular rate rhythm with 3/6 systolic ejection murmur. Respiratory: Clear to auscultation bilaterally anterior lung fields.  Discharge Instructions   Discharge Instructions    Diet general   Complete by: As directed    Discharge wound care:   Complete by: As directed    See wound care orders.   Increase activity slowly   Complete by: As directed      Allergies  as of 11/28/2019      Reactions   Penicillins Rash, Other (See Comments)   PATIENT HAS HAD A PCN REACTION WITH IMMEDIATE RASH, FACIAL/TONGUE/THROAT SWELLING, SOB, OR LIGHTHEADEDNESS WITH HYPOTENSION:  yes Has patient had a PCN reaction causing severe rash involving mucus membranes or skin necrosis: No Has patient had a PCN reaction that required hospitalization No Has patient had a PCN reaction occurring within the last 10 years: No If all of the above answers are "NO", then may proceed with Cephalosporin use.   Prednisone Other (See Comments)   Wheezing   Procaine Hcl Other (See Comments)   Passed out after being given this at dental appointment   Levofloxacin Rash   Oxycodone Other (See Comments)   constipation      Medication List    STOP taking these medications   Eliquis 5 MG Tabs tablet Generic drug: apixaban   furosemide 20 MG tablet Commonly known as: LASIX   potassium chloride 10 MEQ tablet Commonly known as: KLOR-CON     TAKE these medications   acetaminophen 500 MG tablet Commonly known as: TYLENOL Take 1,000 mg by mouth every 6 (six) hours as needed for moderate pain or headache.  alfuzosin 10 MG 24 hr tablet Commonly known as: UROXATRAL Take 10 mg by mouth daily with breakfast.   ARTIFICIAL TEAR OP Place 1 drop into both eyes daily as needed (dry eyes).   atropine 1 % ophthalmic solution Place 2 drops under the tongue 4 (four) times daily as needed (secretions).   digoxin 0.125 MG tablet Commonly known as: LANOXIN Take 1 tablet by mouth once daily What changed: how much to take   finasteride 5 MG tablet Commonly known as: PROSCAR Take 5 mg by mouth at bedtime.   LORazepam 0.5 MG tablet Commonly known as: ATIVAN Take 1 tablet (0.5 mg total) by mouth every 4 (four) hours as needed for anxiety or sleep.   metoprolol tartrate 25 MG tablet Commonly known as: LOPRESSOR Take 0.5 tablets (12.5 mg total) by mouth 2 (two) times daily. 25 mg in the  morning and 12.5 mg in the evening What changed:   how much to take  when to take this  additional instructions   morphine CONCENTRATE 10 MG/0.5ML Soln concentrated solution Place 0.5 mLs (10 mg total) under the tongue every 2 (two) hours as needed for moderate pain or shortness of breath (tachypnea/air hunger).   ondansetron 4 MG disintegrating tablet Commonly known as: ZOFRAN-ODT Take 1 tablet (4 mg total) by mouth every 6 (six) hours as needed for nausea.   polyethylene glycol 17 g packet Commonly known as: MIRALAX / GLYCOLAX Take 17 g by mouth daily as needed for mild constipation.   triamcinolone cream 0.1 % Commonly known as: KENALOG Apply 1 application topically daily as needed (itching).            Discharge Care Instructions  (From admission, onward)         Start     Ordered   11/28/19 0000  Discharge wound care:       Comments: See wound care orders.   11/28/19 1134         Allergies  Allergen Reactions   Penicillins Rash and Other (See Comments)    PATIENT HAS HAD A PCN REACTION WITH IMMEDIATE RASH, FACIAL/TONGUE/THROAT SWELLING, SOB, OR LIGHTHEADEDNESS WITH HYPOTENSION:  yes Has patient had a PCN reaction causing severe rash involving mucus membranes or skin necrosis: No Has patient had a PCN reaction that required hospitalization No Has patient had a PCN reaction occurring within the last 10 years: No If all of the above answers are "NO", then may proceed with Cephalosporin use.   Prednisone Other (See Comments)    Wheezing   Procaine Hcl Other (See Comments)    Passed out after being given this at dental appointment   Levofloxacin Rash   Oxycodone Other (See Comments)    constipation    Follow-up Information    MD AT [facility] Follow up.                The results of significant diagnostics from this hospitalization (including imaging, microbiology, ancillary and laboratory) are listed below for reference.    Significant  Diagnostic Studies: CT Head Wo Contrast  Result Date: 11/22/2019 CLINICAL DATA:  Fall yesterday. On anticoagulation. Weakness for 3 weeks. EXAM: CT HEAD WITHOUT CONTRAST TECHNIQUE: Contiguous axial images were obtained from the base of the skull through the vertex without intravenous contrast. COMPARISON:  Head CT 06/20/2018 FINDINGS: Brain: Mild motion artifact limits assessment. No intracranial hemorrhage, mass effect, or midline shift. Generalized atrophy, stable from prior. No hydrocephalus. The basilar cisterns are patent. Mild chronic small vessel  ischemia. Remote right cerebellar infarct. No evidence of territorial infarct or acute ischemia. No extra-axial or intracranial fluid collection. Vascular: Atherosclerosis of skullbase vasculature without hyperdense vessel or abnormal calcification. Skull: No fracture or focal lesion. Sinuses/Orbits: No acute finding. Other: None. IMPRESSION: 1. No acute intracranial abnormality. No skull fracture. 2. Stable atrophy and chronic small vessel ischemia. Remote right cerebellar infarct. Electronically Signed   By: Keith Rake M.D.   On: 11/22/2019 19:32   DG Chest Port 1 View  Result Date: 11/22/2019 CLINICAL DATA:  Status post fall. EXAM: PORTABLE CHEST 1 VIEW COMPARISON:  June 24, 2018 FINDINGS: There is no evidence of acute infiltrate, pleural effusion or pneumothorax. The heart size and mediastinal contours are within normal limits. An artificial cardiac valve is seen. There is marked severity calcification of the thoracic aorta. Degenerative changes seen involving the mid and lower thoracic spine. IMPRESSION: No active disease. Electronically Signed   By: Virgina Norfolk M.D.   On: 11/22/2019 23:16    Microbiology: Recent Results (from the past 240 hour(s))  SARS Coronavirus 2 by RT PCR (hospital order, performed in [facility] hospital lab) Nasopharyngeal Nasopharyngeal Swab     Status: None   Collection Time: 11/22/19 10:32 PM   Specimen:  Nasopharyngeal Swab  Result Value Ref Range Status   SARS Coronavirus 2 NEGATIVE NEGATIVE Final    Comment: (NOTE) SARS-CoV-2 target nucleic acids are NOT DETECTED. The SARS-CoV-2 RNA is generally detectable in upper and lower respiratory specimens during the acute phase of infection. The lowest concentration of SARS-CoV-2 viral copies this assay can detect is 250 copies / mL. A negative result does not preclude SARS-CoV-2 infection and should not be used as the sole basis for treatment or other patient management decisions.  A negative result may occur with improper specimen collection / handling, submission of specimen other than nasopharyngeal swab, presence of viral mutation(s) within the areas targeted by this assay, and inadequate number of viral copies (<250 copies / mL). A negative result must be combined with clinical observations, patient history, and epidemiological information. Fact Sheet for Patients:   StrictlyIdeas.no Fact Sheet for Healthcare Providers: BankingDealers.co.za This test is not yet approved or cleared  by the [place] FDA and has been authorized for detection and/or diagnosis of SARS-CoV-2 by FDA under an Emergency Use Authorization (EUA).  This EUA will remain in effect (meaning this test can be used) for the duration of the COVID-19 declaration under Section 564(b)(1) of the Act, 21 U.S.C. section 360bbb-3(b)(1), unless the authorization is terminated or revoked sooner. Performed at [facility], [place] [address] [place], [place] [address]   Culture, blood (routine x 2)     Status: None   Collection Time: 11/23/19  2:33 PM   Specimen: BLOOD RIGHT HAND  Result Value Ref Range Status   Specimen Description BLOOD RIGHT HAND  Final   Special Requests   Final    BOTTLES DRAWN AEROBIC AND ANAEROBIC Blood Culture adequate volume   Culture   Final    NO GROWTH 5 DAYS Performed at [facility], [place] [address], [place], [place] [address]    Report Status 11/28/2019 FINAL  Final  Culture, blood (routine x 2)     Status: None   Collection Time: 11/23/19  2:37 PM   Specimen: BLOOD LEFT HAND  Result Value Ref Range Status   Specimen Description BLOOD LEFT HAND  Final   Special Requests   Final    BOTTLES  DRAWN AEROBIC AND ANAEROBIC Blood Culture adequate volume   Culture   Final    NO GROWTH 5 DAYS Performed at [facility], [place] [address], [place], [place] [address]    Report Status 11/28/2019 FINAL  Final     Labs: Basic Metabolic Panel: Recent Labs  Lab 11/22/19 1947 11/23/19 0404  NA 139 141  K 3.1* 3.0*  CL 105 103  CO2 22 24  GLUCOSE 125* 113*  BUN 17 16  CREATININE 1.10 1.12  CALCIUM 8.4* 8.7*  MG  --  2.2   Liver Function Tests: Recent Labs  Lab 11/22/19 1947  AST 23  ALT 16  ALKPHOS 32*  BILITOT 1.1  PROT 6.4*  ALBUMIN 3.5   No results for input(s): LIPASE, AMYLASE in the last 168 hours. No results for input(s): AMMONIA in the last 168 hours. CBC: Recent Labs  Lab 11/22/19 1947 11/23/19 0404  WBC 8.5 8.7  NEUTROABS 6.6  --   HGB 14.2 14.6  HCT 43.5 44.0  MCV 93.1 91.9  PLT 97* 91*   Cardiac Enzymes: No results for input(s): CKTOTAL, CKMB, CKMBINDEX, TROPONINI in the last 168 hours. BNP: BNP (last 3 results) No results for input(s): BNP in the last 8760 hours.  ProBNP (last 3 results) No results for input(s): PROBNP in the last 8760 hours.  CBG: No results for input(s): GLUCAP in the last 168 hours.     Signed:  Irine Seal MD.  [facility] 11/28/2019, 11:50 AM

## 2019-11-28 NOTE — Progress Notes (Signed)
Report given Dyane Dustman, Therapist, sports at [facility].

## 2019-11-28 NOTE — Social Work (Addendum)
Clinical Social Worker facilitated patient discharge including contacting patient family and facility to confirm patient discharge plans.  Clinical information faxed to facility and family agreeable with plan.  CSW arranged ambulance transport via [facility] to [place] to call [phone]  with report prior to discharge.  Confirmed with Farrel Gordon that no scripts needed for [facility] transfer. RN Joaquim Lai and Dr. Grandville Silos aware. Clinical Social Worker will sign off for now as social work intervention is no longer needed. Please consult [place] again if new need arises.  Westley Hummer, MSW, LCSW Clinical Social Worker

## 2019-11-28 NOTE — Progress Notes (Signed)
° °  Palliative Medicine Inpatient Follow Up Note   Reason for consult:  Goals of Care  HPI:  Per intake H&P --> Wayne [name] a 86 y.o.malewith medical history significant forchronic atrial fibrillation on Eliquis, hypertension, aortic stenosis s/p TAVR, chronic diastolic heart failure, CAD s/p stent and BPH who presents with concerns of worsening weakness and confusion.  Palliative care was asked to see Wayne [name] in the setting of a worsening decline over the past few weeks.   Today's Discussion (11/28/2019): Chart reviewed. I met with Wayne [name] and Wayne [name] at bedside. Plan for patient to transition to [facility] this afternoon. He appears comfortable in no acute distress.  Discussed the importance of continued conversation with family and their  medical providers regarding overall plan of care and treatment options, ensuring decisions are within the context of the patients values and GOCs.  SUMMARY OF RECOMMENDATIONS DNAR/DNI  Continue comfort measures  POA documents on chart  Chaplain support (Patient is Doctor, general practice)  [employer] [facility] transfer this afternoon  Time Spent: 15 Greater than 50% of the time was spent in counseling and coordination of care ______________________________________________________________________________________ [place] Team Team Cell Phone: [phone] Please utilize secure chat with additional questions, if there is no response within 30 minutes please call the above phone number  Palliative Medicine Team providers are available by phone from 7am to 7pm daily and can be reached through the team cell phone.  Should this patient require assistance outside of these hours, please call the patient's attending physician.

## 2019-11-28 NOTE — TOC Transition Note (Addendum)
Transition of Care [facility]) - CM/SW Discharge Note   Patient Details  Name: Wayne [name] MRN: [id number] Date of Birth: [birth year]  [facility] CM/SW Contact:  Alexander Mt, LCSW Phone Number: 11/28/2019, 9:57 AM   Clinical Narrative:    12:02pm- discharge paperwork completed, faxed to [facility] called for 2:00pm. CSW spoke with pt daughter Almyra Free in hallway, she is aware and aware that transport does not often pick up right at that time. Dc summary faxed to [facility]. RN aware.   11:19am- Consents signed, DNR filled out to be signed at nursing station, await d/c summary and orders.   9:57am- Pt with bed available at [facility] at this time. Per liaison they will be in touch with family to complete consents. Pt physician also secure messaged for dc summary. Will arrange [facility] when consents signed/discharge paperwork completed.    Final next level of care: [place] Barriers to Discharge: Barriers Resolved   Patient Goals and CMS Choice Patient states their goals for this hospitalization and ongoing recovery are:: comfort care   Choice offered to / list presented to : Spouse, Adult Children  Discharge Placement              Patient chooses bed at: Other - please specify in the comment section below: ([place]) Patient to be transferred to facility by: [facility] Name of family member notified: pt wife Patient and family notified of of transfer: 11/28/19   Readmission Risk Interventions No flowsheet data found.

## 2019-12-10 ENCOUNTER — Other Ambulatory Visit (HOSPITAL_COMMUNITY): Payer: Medicare Other

## 2019-12-12 ENCOUNTER — Other Ambulatory Visit: Payer: Self-pay | Admitting: General Practice

## 2019-12-12 DIAGNOSIS — I4821 Permanent atrial fibrillation: Secondary | ICD-10-CM

## 2019-12-12 DIAGNOSIS — Z952 Presence of prosthetic heart valve: Secondary | ICD-10-CM

## 2019-12-12 DIAGNOSIS — I340 Nonrheumatic mitral (valve) insufficiency: Secondary | ICD-10-CM

## 2019-12-18 DEATH — deceased

## 2020-01-07 IMAGING — CT CT CTA ABD/PEL W/CM AND/OR W/O CM
3 of 14 series · 8 of 46 positions shown, 13 images · IV contrast (iopamidol)
Comparison: No priors

CLINICAL DATA: 87-year-old male with history of severe aortic
stenosis. Preprocedural study prior to potential transcatheter
aortic valve replacement (TAVR).

EXAM:
CT ANGIOGRAPHY CHEST, ABDOMEN AND PELVIS
TECHNIQUE: Multidetector CT imaging through the chest, abdomen and pelvis was
performed using the standard protocol during bolus administration of
intravenous contrast. Multiplanar reconstructed images and MIPs were
obtained and reviewed to evaluate the vascular anatomy.
CONTRAST:  100mL [id number] IOPAMIDOL ([id number]) INJECTION 76%

[Series 8: 0-90% · axial · 0.35mm/px · z∈[+1162,+1215]mm · 3 of 4210 slices shown]
[im 444/4210  soft-tissue]
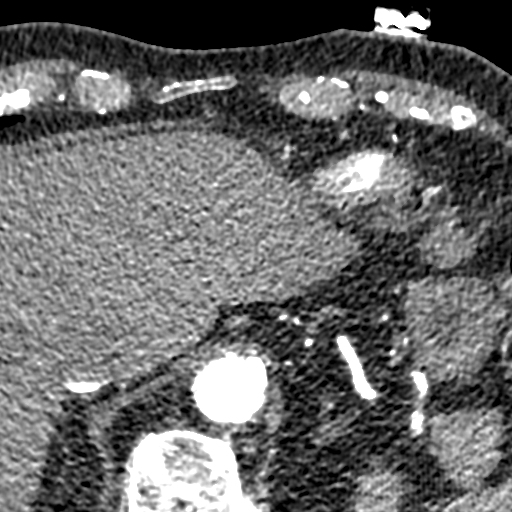
[im 887/4210  soft-tissue]
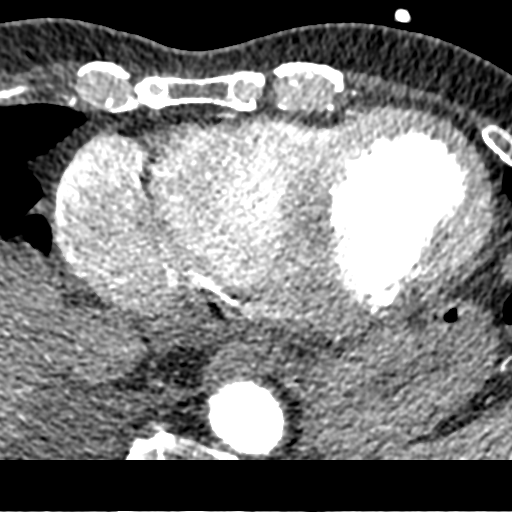
[im 1330/4210  soft-tissue]
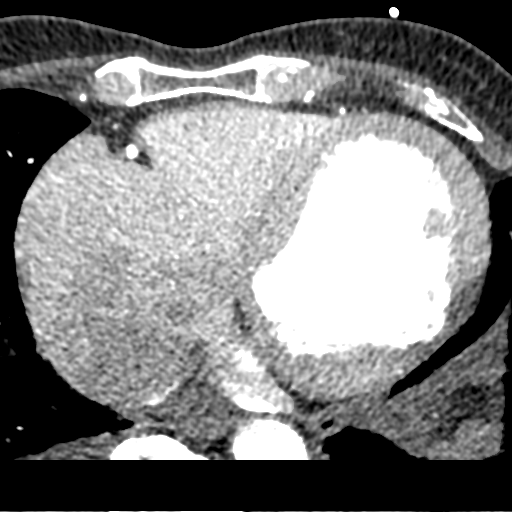

[Series 14: ax thins · axial · 0.78mm/px · z∈[+799,+1457]mm · 3 of 659 slices shown, 7 images]
[im 1/659  soft-tissue]
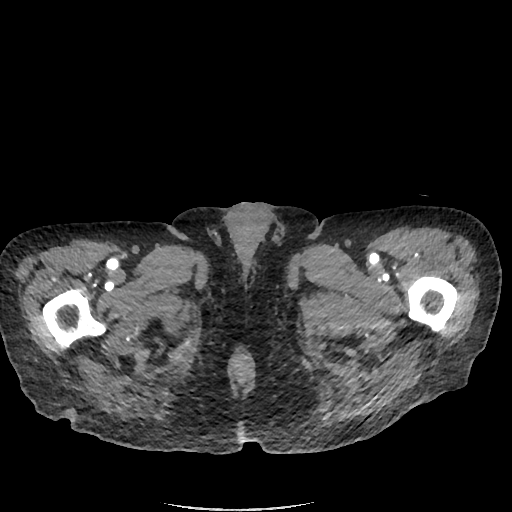
[im 1/659  lung]
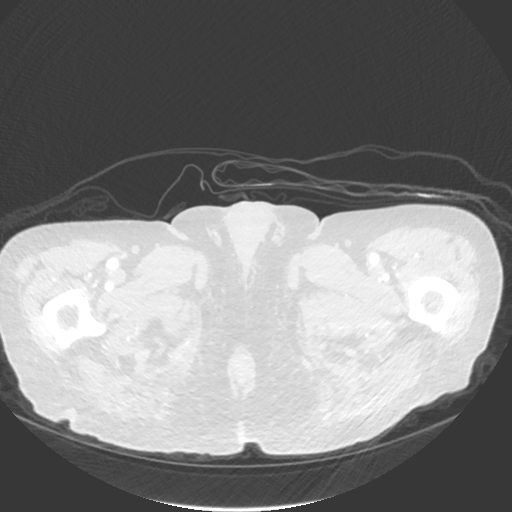
[im 1/659  bone]
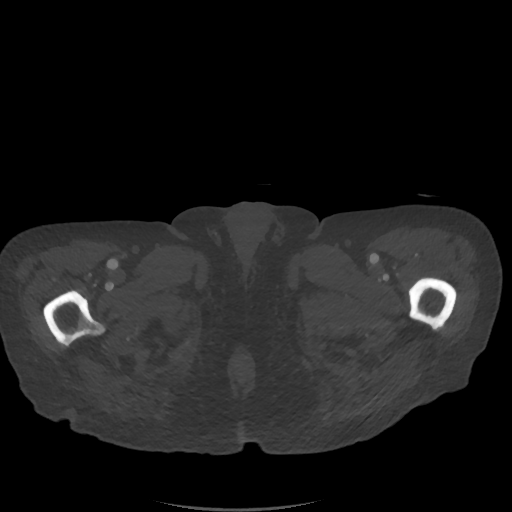
[im 330/659  soft-tissue]
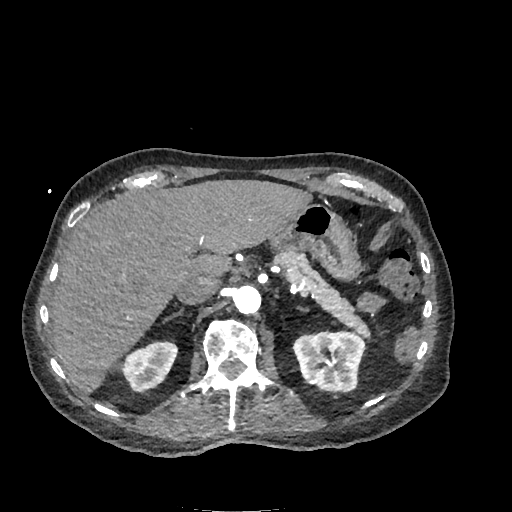
[im 330/659  lung]
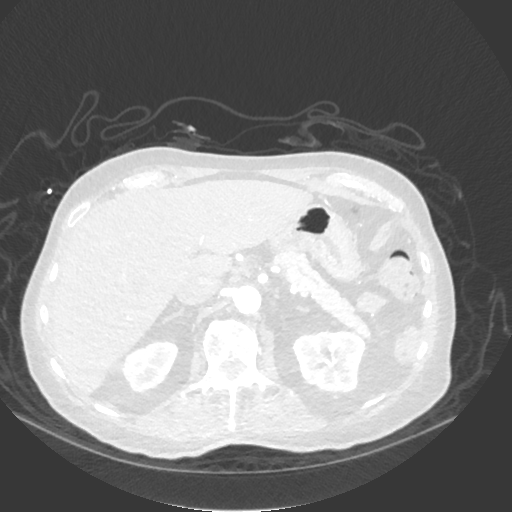
[im 659/659  soft-tissue]
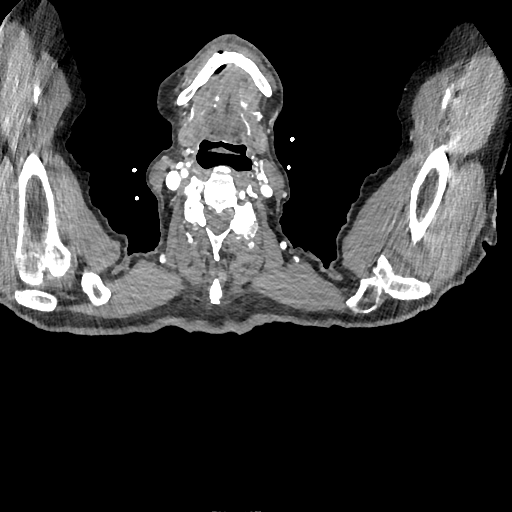
[im 659/659  lung]
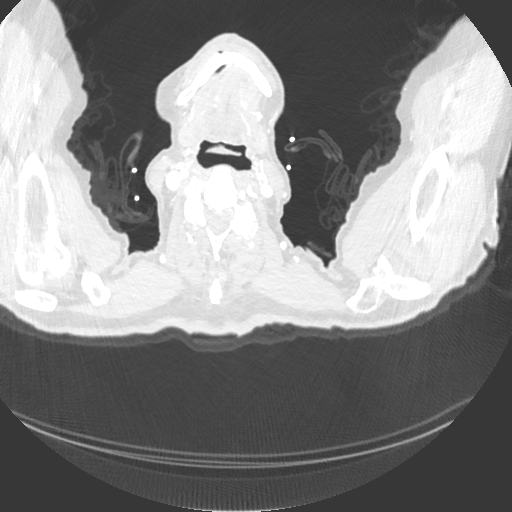

[Series 17: cor · coronal · 0.62mm/px · 2 of 131 slices shown, 3 images]
[im 44/131  soft-tissue]
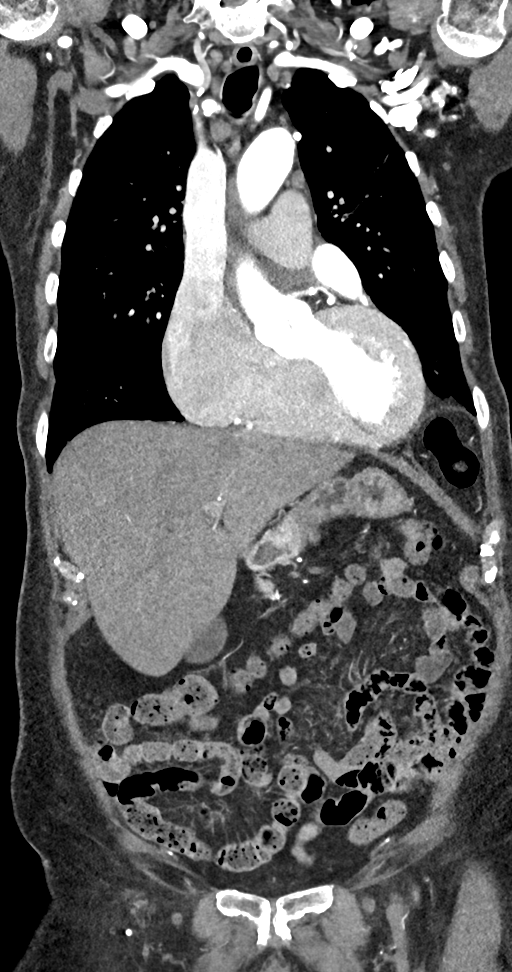
[im 44/131  bone]
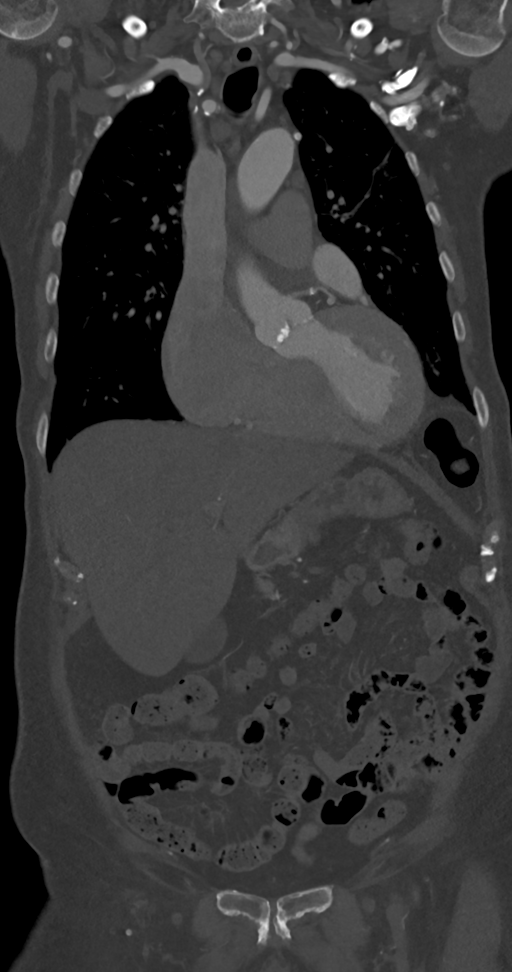
[im 87/131  soft-tissue]
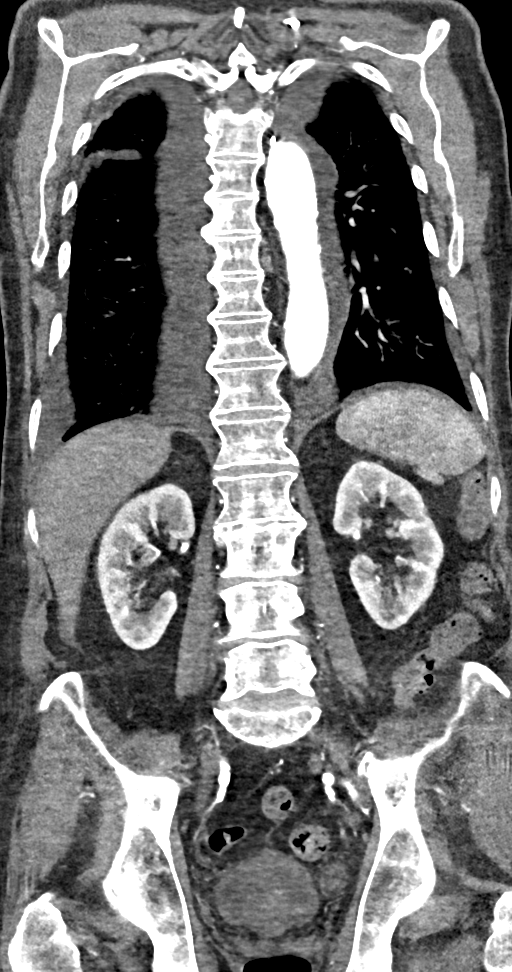

[8 of 46 positions shown; findings below may reference images not displayed]

FINDINGS: CTA CHEST FINDINGS

Cardiovascular: Heart size is enlarged with severe left atrial
dilatation. There is no significant pericardial fluid, thickening or
pericardial calcification. There is aortic atherosclerosis, as well
as atherosclerosis of the great vessels of the mediastinum and the
coronary arteries, including calcified atherosclerotic plaque in the
left main, left anterior descending, left circumflex and right
coronary arteries. Large area of myocardial thinning involving the
lateral, inferolateral and inferior wall segments in the mid left
ventricle, indicative of fibrofatty metaplasia and scarring from
prior circumflex territory myocardial infarction(s). Severe
thickening calcification of the aortic valve. Thickening
calcification of the mitral valve and mitral annulus.

Mediastinum/Lymph Nodes: No pathologically enlarged mediastinal or
hilar lymph nodes. Esophagus is unremarkable in appearance. No
axillary lymphadenopathy.

Lungs/Pleura: Moderate bilateral pleural effusions lying
dependently. No acute consolidative airspace disease. No suspicious
appearing pulmonary nodules or masses. Linear scarring in the left
upper lobe.

Musculoskeletal/Soft Tissues: There are no aggressive appearing
lytic or blastic lesions noted in the visualized portions of the
skeleton.

CTA ABDOMEN AND PELVIS FINDINGS

Hepatobiliary: No suspicious cystic or solid hepatic lesions. No
intra or extrahepatic biliary ductal dilatation. Gallbladder is
normal in appearance.

Pancreas: No pancreatic mass. No pancreatic ductal dilatation. No
pancreatic or peripancreatic fluid or inflammatory changes.

Spleen: Unremarkable.

Adrenals/Urinary Tract: 1.6 cm exophytic simple cyst extending off
the lateral aspect of the upper pole of the right kidney. 1.6 cm
partially exophytic simple cyst in the interpolar region of the left
kidney laterally. No suspicious renal lesions. Bilateral adrenal
glands are normal in appearance. No hydroureteronephrosis. 3.9 cm
left-sided bladder diverticulum with some mild irregularity in the
dependent portion of the diverticulum (axial image 191 of series
15). Small amount of contrast material lying dependently in the
urinary bladder, presumably related to test injection.

Stomach/Bowel: Normal appearance of the stomach. No pathologic
dilatation of small bowel or colon. Normal appendix.

Vascular/Lymphatic: Aortic atherosclerosis, with vascular findings
and measurements pertinent to potential TAVR procedure, as detailed
below. No aneurysm or dissection noted in the abdominal or pelvic
vasculature. Celiac axis, superior mesenteric artery and inferior
mesenteric artery and their major branches are widely patent without
hemodynamically significant stenosis. Single renal arteries are
widely patent bilaterally. No lymphadenopathy noted in the abdomen
or pelvis.

Reproductive: Prostate gland and seminal vesicles are unremarkable
in appearance.

Other: No significant volume of ascites.  No pneumoperitoneum.

Musculoskeletal: Chronic appearing compression fracture of L3 with
approximately 20% loss of anterior vertebral body height. There are
no aggressive appearing lytic or blastic lesions noted in the
visualized portions of the skeleton.

VASCULAR MEASUREMENTS PERTINENT TO TAVR:

AORTA:

Minimal Aortic Ciameter-RT x 16 mm

Severity of Aortic Calcification-severe

RIGHT PELVIS:

Right Common Iliac Artery -

Minimal Miameter-MX.M x 10.5 mm

Tortuosity-mild

Calcification-moderate

Right External Iliac Artery -

Minimal Ziameter-D.E x 8.9 mm

Tortuosity-severe

Calcification-mild

Right Common Femoral Artery -

Minimal Piameter-W.Y x 10.4 mm

Tortuosity-mild

Calcification-mild

LEFT PELVIS:

Left Common Iliac Artery -

Minimal Qiameter-33.3 x 10.0 mm

Tortuosity-mild

Calcification-moderate

Left External Iliac Artery -

Minimal 7iameter-B.3 x 7.7 mm

Tortuosity-moderate

Calcification-mild

Left Common Femoral Artery -

Minimal Siameter-N.H x 8.3 mm

Tortuosity-mild

Calcification-mild

Review of the MIP images confirms the above findings.
IMPRESSION: 1. Vascular findings and measurements pertinent to potential TAVR
procedure, as detailed above.
2. Severe thickening calcification of the aortic valve, compatible
with the reported clinical history of severe aortic stenosis.
3. Aortic atherosclerosis, in addition to left main and 3 vessel
coronary artery disease. Evidence of prior left circumflex territory
myocardial infarction(s), as above.
4. Severe thickening calcification of the mitral valve and mitral
annulus.
5. Cardiomegaly with severe left atrial dilatation.
6. Moderate bilateral pleural effusions lying dependently are simple
in appearance.
7. Large left-sided bladder wall diverticulum with some mild
irregularity in the dependent portion of the diverticulum. This
could simply represent some retained debris, however, nonemergent
Urologic consultation is suggested, particularly if there is any
history of hematuria as a urothelial neoplasm in this region is not
entirely excluded.
8. Additional incidental findings, as above.

Aortic Atherosclerosis ([id number].X).

## 2020-01-14 ENCOUNTER — Ambulatory Visit: Payer: Medicare Other | Admitting: Cardiology
